# Patient Record
Sex: Female | Born: 1955 | Race: White | Hispanic: No | Smoking: Former smoker
Health system: Southern US, Community
[De-identification: ages and names within clinical notes are randomized; demographics above are authoritative.]

## PROBLEM LIST (undated history)

## (undated) DIAGNOSIS — E785 Hyperlipidemia, unspecified: Secondary | ICD-10-CM

## (undated) DIAGNOSIS — M1990 Unspecified osteoarthritis, unspecified site: Secondary | ICD-10-CM

## (undated) DIAGNOSIS — E039 Hypothyroidism, unspecified: Secondary | ICD-10-CM

## (undated) DIAGNOSIS — F419 Anxiety disorder, unspecified: Secondary | ICD-10-CM

## (undated) DIAGNOSIS — IMO0002 Reserved for concepts with insufficient information to code with codable children: Secondary | ICD-10-CM

## (undated) DIAGNOSIS — I2699 Other pulmonary embolism without acute cor pulmonale: Secondary | ICD-10-CM

## (undated) DIAGNOSIS — J449 Chronic obstructive pulmonary disease, unspecified: Secondary | ICD-10-CM

## (undated) DIAGNOSIS — IMO0001 Reserved for inherently not codable concepts without codable children: Secondary | ICD-10-CM

## (undated) DIAGNOSIS — Z66 Do not resuscitate: Principal | ICD-10-CM

## (undated) DIAGNOSIS — C3490 Malignant neoplasm of unspecified part of unspecified bronchus or lung: Secondary | ICD-10-CM

## (undated) HISTORY — DX: Reserved for inherently not codable concepts without codable children: IMO0001

## (undated) HISTORY — DX: Unspecified osteoarthritis, unspecified site: M19.90

## (undated) HISTORY — PX: THYROIDECTOMY, PARTIAL: SHX18

## (undated) HISTORY — DX: Hyperlipidemia, unspecified: E78.5

## (undated) HISTORY — PX: TONSILLECTOMY: SHX5217

## (undated) HISTORY — PX: TUBAL LIGATION: SHX77

## (undated) HISTORY — DX: Do not resuscitate: Z66

## (undated) HISTORY — PX: PARTIAL HYSTERECTOMY: SHX80

## (undated) HISTORY — DX: Anxiety disorder, unspecified: F41.9

## (undated) HISTORY — DX: Chronic obstructive pulmonary disease, unspecified: J44.9

## (undated) HISTORY — DX: Hypothyroidism, unspecified: E03.9

## (undated) HISTORY — PX: TONSILLECTOMY: SUR1361

## (undated) HISTORY — DX: Reserved for concepts with insufficient information to code with codable children: IMO0002

---

## 2005-05-26 ENCOUNTER — Ambulatory Visit: Payer: Self-pay | Admitting: Nurse Practitioner

## 2005-06-08 ENCOUNTER — Ambulatory Visit: Payer: Self-pay | Admitting: Nurse Practitioner

## 2005-07-13 ENCOUNTER — Ambulatory Visit: Payer: Self-pay | Admitting: Nurse Practitioner

## 2005-07-20 ENCOUNTER — Ambulatory Visit: Payer: Self-pay | Admitting: *Deleted

## 2005-07-28 ENCOUNTER — Ambulatory Visit: Payer: Self-pay | Admitting: Nurse Practitioner

## 2005-07-29 ENCOUNTER — Ambulatory Visit (HOSPITAL_COMMUNITY): Admission: RE | Admit: 2005-07-29 | Discharge: 2005-07-29 | Payer: Self-pay | Admitting: Nurse Practitioner

## 2005-08-11 ENCOUNTER — Ambulatory Visit: Payer: Self-pay | Admitting: Nurse Practitioner

## 2005-12-15 ENCOUNTER — Ambulatory Visit: Payer: Self-pay | Admitting: Nurse Practitioner

## 2006-03-04 ENCOUNTER — Ambulatory Visit: Payer: Self-pay | Admitting: Nurse Practitioner

## 2006-03-23 ENCOUNTER — Ambulatory Visit: Payer: Self-pay | Admitting: Nurse Practitioner

## 2006-04-05 ENCOUNTER — Ambulatory Visit: Payer: Self-pay | Admitting: Nurse Practitioner

## 2006-04-06 ENCOUNTER — Ambulatory Visit: Payer: Self-pay | Admitting: Nurse Practitioner

## 2006-04-27 ENCOUNTER — Ambulatory Visit: Payer: Self-pay | Admitting: Family Medicine

## 2006-05-03 ENCOUNTER — Ambulatory Visit: Payer: Self-pay | Admitting: Nurse Practitioner

## 2006-06-28 ENCOUNTER — Ambulatory Visit: Payer: Self-pay | Admitting: Nurse Practitioner

## 2006-08-25 ENCOUNTER — Ambulatory Visit (HOSPITAL_BASED_OUTPATIENT_CLINIC_OR_DEPARTMENT_OTHER): Admission: RE | Admit: 2006-08-25 | Discharge: 2006-08-25 | Payer: Self-pay | Admitting: Family Medicine

## 2006-09-19 ENCOUNTER — Ambulatory Visit: Payer: Self-pay | Admitting: Internal Medicine

## 2006-10-31 ENCOUNTER — Ambulatory Visit: Payer: Self-pay | Admitting: Internal Medicine

## 2006-11-30 ENCOUNTER — Ambulatory Visit: Payer: Self-pay | Admitting: Nurse Practitioner

## 2006-12-02 ENCOUNTER — Ambulatory Visit: Payer: Self-pay | Admitting: Nurse Practitioner

## 2007-05-17 ENCOUNTER — Encounter (INDEPENDENT_AMBULATORY_CARE_PROVIDER_SITE_OTHER): Payer: Self-pay | Admitting: Nurse Practitioner

## 2007-05-17 ENCOUNTER — Ambulatory Visit: Payer: Self-pay | Admitting: Internal Medicine

## 2007-05-17 LAB — CONVERTED CEMR LAB
Albumin: 4.5 g/dL (ref 3.5–5.2)
Basophils Absolute: 0.1 10*3/uL (ref 0.0–0.1)
Calcium: 8.7 mg/dL (ref 8.4–10.5)
Creatinine, Ser: 0.68 mg/dL (ref 0.40–1.20)
HCT: 44.8 % (ref 36.0–46.0)
Hemoglobin: 14.9 g/dL (ref 12.0–15.0)
MCHC: 33.3 g/dL (ref 30.0–36.0)
MCV: 89.6 fL (ref 78.0–100.0)
Monocytes Relative: 6 % (ref 3–11)
Platelets: 222 10*3/uL (ref 150–400)
RDW: 13.3 % (ref 11.5–14.0)
TSH: 3.684 microintl units/mL (ref 0.350–5.50)

## 2007-05-18 ENCOUNTER — Encounter (INDEPENDENT_AMBULATORY_CARE_PROVIDER_SITE_OTHER): Payer: Self-pay | Admitting: Nurse Practitioner

## 2007-05-18 LAB — CONVERTED CEMR LAB: Hgb A1c MFr Bld: 9.2 % — ABNORMAL HIGH (ref 4.6–6.1)

## 2007-06-15 ENCOUNTER — Encounter (INDEPENDENT_AMBULATORY_CARE_PROVIDER_SITE_OTHER): Payer: Self-pay | Admitting: Nurse Practitioner

## 2007-06-15 DIAGNOSIS — E119 Type 2 diabetes mellitus without complications: Secondary | ICD-10-CM

## 2007-06-15 DIAGNOSIS — E039 Hypothyroidism, unspecified: Secondary | ICD-10-CM

## 2007-06-15 DIAGNOSIS — E785 Hyperlipidemia, unspecified: Secondary | ICD-10-CM | POA: Insufficient documentation

## 2007-06-15 DIAGNOSIS — M810 Age-related osteoporosis without current pathological fracture: Secondary | ICD-10-CM

## 2007-06-15 DIAGNOSIS — F411 Generalized anxiety disorder: Secondary | ICD-10-CM | POA: Insufficient documentation

## 2007-06-26 ENCOUNTER — Ambulatory Visit: Payer: Self-pay | Admitting: Nurse Practitioner

## 2007-06-26 LAB — CONVERTED CEMR LAB
Cholesterol: 289 mg/dL — ABNORMAL HIGH (ref 0–200)
TSH: 5.082 microintl units/mL (ref 0.350–5.50)
Total CHOL/HDL Ratio: 7.6
Triglycerides: 371 mg/dL — ABNORMAL HIGH (ref <150)
VLDL: 74 mg/dL — ABNORMAL HIGH (ref 0–40)

## 2007-06-28 ENCOUNTER — Ambulatory Visit: Payer: Self-pay | Admitting: Internal Medicine

## 2007-07-05 ENCOUNTER — Encounter (INDEPENDENT_AMBULATORY_CARE_PROVIDER_SITE_OTHER): Payer: Self-pay | Admitting: *Deleted

## 2007-09-28 ENCOUNTER — Ambulatory Visit: Payer: Self-pay | Admitting: Family Medicine

## 2007-11-01 ENCOUNTER — Ambulatory Visit: Payer: Self-pay | Admitting: Internal Medicine

## 2007-11-01 ENCOUNTER — Encounter (INDEPENDENT_AMBULATORY_CARE_PROVIDER_SITE_OTHER): Payer: Self-pay | Admitting: Nurse Practitioner

## 2007-11-01 LAB — CONVERTED CEMR LAB
Cholesterol: 288 mg/dL — ABNORMAL HIGH (ref 0–200)
Total CHOL/HDL Ratio: 8.5
Triglycerides: 334 mg/dL — ABNORMAL HIGH (ref <150)
VLDL: 67 mg/dL — ABNORMAL HIGH (ref 0–40)

## 2007-11-16 ENCOUNTER — Ambulatory Visit: Payer: Self-pay | Admitting: Internal Medicine

## 2007-11-16 ENCOUNTER — Encounter (INDEPENDENT_AMBULATORY_CARE_PROVIDER_SITE_OTHER): Payer: Self-pay | Admitting: Nurse Practitioner

## 2007-11-16 LAB — CONVERTED CEMR LAB
BUN: 12 mg/dL (ref 6–23)
CO2: 23 meq/L (ref 19–32)
Calcium: 9.2 mg/dL (ref 8.4–10.5)
Chloride: 104 meq/L (ref 96–112)
Sodium: 140 meq/L (ref 135–145)
TSH: 2.857 microintl units/mL (ref 0.350–5.50)
Total Bilirubin: 0.4 mg/dL (ref 0.3–1.2)
Total Protein: 7.2 g/dL (ref 6.0–8.3)

## 2008-06-17 ENCOUNTER — Ambulatory Visit: Payer: Self-pay | Admitting: Family Medicine

## 2008-06-17 LAB — CONVERTED CEMR LAB
AST: 17 units/L (ref 0–37)
Albumin: 4.3 g/dL (ref 3.5–5.2)
Alkaline Phosphatase: 54 units/L (ref 39–117)
CO2: 23 meq/L (ref 19–32)
Creatinine, Ser: 0.72 mg/dL (ref 0.40–1.20)
Glucose, Bld: 102 mg/dL — ABNORMAL HIGH (ref 70–99)
LDL Cholesterol: 139 mg/dL — ABNORMAL HIGH (ref 0–99)
Microalb, Ur: 0.22 mg/dL (ref 0.00–1.89)
Potassium: 4.3 meq/L (ref 3.5–5.3)
Sodium: 141 meq/L (ref 135–145)
Total Bilirubin: 0.4 mg/dL (ref 0.3–1.2)
Triglycerides: 306 mg/dL — ABNORMAL HIGH (ref <150)
VLDL: 61 mg/dL — ABNORMAL HIGH (ref 0–40)

## 2008-11-04 ENCOUNTER — Ambulatory Visit: Payer: Self-pay | Admitting: Internal Medicine

## 2008-11-28 ENCOUNTER — Encounter: Payer: Self-pay | Admitting: Internal Medicine

## 2008-11-28 ENCOUNTER — Ambulatory Visit: Payer: Self-pay | Admitting: Internal Medicine

## 2008-11-28 ENCOUNTER — Ambulatory Visit: Payer: Self-pay | Admitting: Vascular Surgery

## 2008-11-28 ENCOUNTER — Encounter (INDEPENDENT_AMBULATORY_CARE_PROVIDER_SITE_OTHER): Payer: Self-pay | Admitting: Internal Medicine

## 2008-11-28 ENCOUNTER — Ambulatory Visit (HOSPITAL_COMMUNITY): Admission: RE | Admit: 2008-11-28 | Discharge: 2008-11-28 | Payer: Self-pay | Admitting: Internal Medicine

## 2008-11-28 LAB — CONVERTED CEMR LAB
ALT: 28 units/L (ref 0–35)
AST: 21 units/L (ref 0–37)
Alkaline Phosphatase: 55 units/L (ref 39–117)
BUN: 9 mg/dL (ref 6–23)
CO2: 22 meq/L (ref 19–32)
Calcium: 8.8 mg/dL (ref 8.4–10.5)
Cholesterol: 291 mg/dL — ABNORMAL HIGH (ref 0–200)
Creatinine, Ser: 0.66 mg/dL (ref 0.40–1.20)
Lymphocytes Relative: 40 % (ref 12–46)
Lymphs Abs: 3.5 10*3/uL (ref 0.7–4.0)
Magnesium: 1.9 mg/dL (ref 1.5–2.5)
Monocytes Absolute: 0.6 10*3/uL (ref 0.1–1.0)
Platelets: 243 10*3/uL (ref 150–400)
Potassium: 4.3 meq/L (ref 3.5–5.3)
RBC: 4.76 M/uL (ref 3.87–5.11)
Total Bilirubin: 0.4 mg/dL (ref 0.3–1.2)
Total Protein: 7.1 g/dL (ref 6.0–8.3)
Triglycerides: 396 mg/dL — ABNORMAL HIGH (ref <150)
WBC: 8.8 10*3/uL (ref 4.0–10.5)

## 2008-12-23 ENCOUNTER — Ambulatory Visit: Payer: Self-pay | Admitting: Internal Medicine

## 2008-12-24 ENCOUNTER — Ambulatory Visit (HOSPITAL_COMMUNITY): Admission: RE | Admit: 2008-12-24 | Discharge: 2008-12-24 | Payer: Self-pay | Admitting: Internal Medicine

## 2009-02-18 ENCOUNTER — Ambulatory Visit: Payer: Self-pay | Admitting: Internal Medicine

## 2009-03-06 ENCOUNTER — Ambulatory Visit: Payer: Self-pay | Admitting: Internal Medicine

## 2009-03-06 ENCOUNTER — Encounter: Payer: Self-pay | Admitting: Internal Medicine

## 2009-03-25 ENCOUNTER — Ambulatory Visit: Payer: Self-pay | Admitting: Internal Medicine

## 2009-03-25 ENCOUNTER — Ambulatory Visit (HOSPITAL_COMMUNITY): Admission: RE | Admit: 2009-03-25 | Discharge: 2009-03-25 | Payer: Self-pay | Admitting: Internal Medicine

## 2009-03-28 ENCOUNTER — Ambulatory Visit (HOSPITAL_COMMUNITY): Admission: RE | Admit: 2009-03-28 | Discharge: 2009-03-28 | Payer: Self-pay | Admitting: Internal Medicine

## 2009-04-25 ENCOUNTER — Ambulatory Visit: Payer: Self-pay | Admitting: Internal Medicine

## 2009-04-30 ENCOUNTER — Ambulatory Visit: Payer: Self-pay | Admitting: Internal Medicine

## 2009-05-09 ENCOUNTER — Ambulatory Visit: Payer: Self-pay | Admitting: Internal Medicine

## 2009-05-09 DIAGNOSIS — J441 Chronic obstructive pulmonary disease with (acute) exacerbation: Secondary | ICD-10-CM

## 2009-05-26 ENCOUNTER — Ambulatory Visit: Payer: Self-pay | Admitting: Family Medicine

## 2009-06-09 ENCOUNTER — Ambulatory Visit: Payer: Self-pay | Admitting: Internal Medicine

## 2009-06-12 ENCOUNTER — Encounter: Payer: Self-pay | Admitting: Internal Medicine

## 2009-06-12 ENCOUNTER — Ambulatory Visit: Admission: RE | Admit: 2009-06-12 | Discharge: 2009-06-12 | Payer: Self-pay | Admitting: Internal Medicine

## 2009-06-12 ENCOUNTER — Ambulatory Visit: Payer: Self-pay | Admitting: Internal Medicine

## 2009-06-14 DIAGNOSIS — C3411 Malignant neoplasm of upper lobe, right bronchus or lung: Secondary | ICD-10-CM

## 2009-06-18 ENCOUNTER — Ambulatory Visit: Payer: Self-pay | Admitting: Internal Medicine

## 2009-06-19 ENCOUNTER — Encounter: Payer: Self-pay | Admitting: Emergency Medicine

## 2009-06-19 ENCOUNTER — Ambulatory Visit: Payer: Self-pay | Admitting: Emergency Medicine

## 2009-06-19 ENCOUNTER — Encounter: Payer: Self-pay | Admitting: Internal Medicine

## 2009-06-19 ENCOUNTER — Ambulatory Visit: Payer: Self-pay | Admitting: Internal Medicine

## 2009-06-20 ENCOUNTER — Ambulatory Visit (HOSPITAL_COMMUNITY): Admission: RE | Admit: 2009-06-20 | Discharge: 2009-06-20 | Payer: Self-pay | Admitting: Internal Medicine

## 2009-06-24 ENCOUNTER — Ambulatory Visit: Admission: RE | Admit: 2009-06-24 | Discharge: 2009-08-22 | Payer: Self-pay | Admitting: Radiation Oncology

## 2009-06-24 LAB — COMPREHENSIVE METABOLIC PANEL WITH GFR
ALT: 14 U/L (ref 0–35)
AST: 13 U/L (ref 0–37)
Albumin: 4.3 g/dL (ref 3.5–5.2)
Alkaline Phosphatase: 57 U/L (ref 39–117)
BUN: 13 mg/dL (ref 6–23)
CO2: 22 meq/L (ref 19–32)
Calcium: 9.2 mg/dL (ref 8.4–10.5)
Chloride: 106 meq/L (ref 96–112)
Creatinine, Ser: 0.68 mg/dL (ref 0.40–1.20)
Glucose, Bld: 145 mg/dL — ABNORMAL HIGH (ref 70–99)
Potassium: 4.3 meq/L (ref 3.5–5.3)
Sodium: 141 meq/L (ref 135–145)
Total Bilirubin: 0.2 mg/dL — ABNORMAL LOW (ref 0.3–1.2)
Total Protein: 7.2 g/dL (ref 6.0–8.3)

## 2009-06-24 LAB — CBC WITH DIFFERENTIAL/PLATELET
BASO%: 0.8 % (ref 0.0–2.0)
Basophils Absolute: 0.1 10e3/uL (ref 0.0–0.1)
EOS%: 2.9 % (ref 0.0–7.0)
Eosinophils Absolute: 0.3 10e3/uL (ref 0.0–0.5)
HCT: 38.1 % (ref 34.8–46.6)
HGB: 13 g/dL (ref 11.6–15.9)
LYMPH%: 20.2 % (ref 14.0–49.7)
MCH: 29.7 pg (ref 25.1–34.0)
MCHC: 34.2 g/dL (ref 31.5–36.0)
MCV: 86.9 fL (ref 79.5–101.0)
MONO#: 0.6 10e3/uL (ref 0.1–0.9)
MONO%: 5.5 % (ref 0.0–14.0)
NEUT#: 7.9 10e3/uL — ABNORMAL HIGH (ref 1.5–6.5)
NEUT%: 70.6 % (ref 38.4–76.8)
Platelets: 344 10e3/uL (ref 145–400)
RBC: 4.39 10e6/uL (ref 3.70–5.45)
RDW: 13.5 % (ref 11.2–14.5)
WBC: 11.3 10e3/uL — ABNORMAL HIGH (ref 3.9–10.3)
lymph#: 2.3 10e3/uL (ref 0.9–3.3)

## 2009-06-26 ENCOUNTER — Ambulatory Visit (HOSPITAL_COMMUNITY): Admission: RE | Admit: 2009-06-26 | Discharge: 2009-06-26 | Payer: Self-pay | Admitting: Emergency Medicine

## 2009-06-30 LAB — CBC WITH DIFFERENTIAL/PLATELET
BASO%: 1.1 % (ref 0.0–2.0)
EOS%: 2.9 % (ref 0.0–7.0)
Eosinophils Absolute: 0.3 10*3/uL (ref 0.0–0.5)
LYMPH%: 27.6 % (ref 14.0–49.7)
MCH: 28.9 pg (ref 25.1–34.0)
MCHC: 33.8 g/dL (ref 31.5–36.0)
MCV: 85.4 fL (ref 79.5–101.0)
MONO%: 6.3 % (ref 0.0–14.0)
NEUT#: 5.9 10*3/uL (ref 1.5–6.5)
Platelets: 247 10*3/uL (ref 145–400)
RBC: 4.81 10*6/uL (ref 3.70–5.45)
RDW: 14 % (ref 11.2–14.5)

## 2009-06-30 LAB — COMPREHENSIVE METABOLIC PANEL
ALT: 17 U/L (ref 0–35)
AST: 14 U/L (ref 0–37)
Albumin: 4.5 g/dL (ref 3.5–5.2)
Alkaline Phosphatase: 59 U/L (ref 39–117)
Potassium: 4.1 mEq/L (ref 3.5–5.3)
Sodium: 141 mEq/L (ref 135–145)
Total Bilirubin: 0.3 mg/dL (ref 0.3–1.2)
Total Protein: 7.2 g/dL (ref 6.0–8.3)

## 2009-07-07 LAB — CBC WITH DIFFERENTIAL/PLATELET
EOS%: 4.4 % (ref 0.0–7.0)
Eosinophils Absolute: 0.4 10*3/uL (ref 0.0–0.5)
LYMPH%: 17.6 % (ref 14.0–49.7)
MCH: 29.3 pg (ref 25.1–34.0)
MCV: 85.5 fL (ref 79.5–101.0)
MONO%: 7.7 % (ref 0.0–14.0)
NEUT#: 5.8 10*3/uL (ref 1.5–6.5)
Platelets: 213 10*3/uL (ref 145–400)
RBC: 4.34 10*6/uL (ref 3.70–5.45)
RDW: 13.7 % (ref 11.2–14.5)

## 2009-07-07 LAB — COMPREHENSIVE METABOLIC PANEL
AST: 16 U/L (ref 0–37)
Albumin: 4.2 g/dL (ref 3.5–5.2)
Alkaline Phosphatase: 57 U/L (ref 39–117)
BUN: 7 mg/dL (ref 6–23)
Glucose, Bld: 131 mg/dL — ABNORMAL HIGH (ref 70–99)
Potassium: 4 mEq/L (ref 3.5–5.3)
Sodium: 141 mEq/L (ref 135–145)
Total Bilirubin: 0.3 mg/dL (ref 0.3–1.2)

## 2009-07-14 LAB — CBC WITH DIFFERENTIAL/PLATELET
BASO%: 1.4 % (ref 0.0–2.0)
Basophils Absolute: 0.1 10*3/uL (ref 0.0–0.1)
EOS%: 3 % (ref 0.0–7.0)
HCT: 36.8 % (ref 34.8–46.6)
HGB: 12.5 g/dL (ref 11.6–15.9)
LYMPH%: 16.5 % (ref 14.0–49.7)
MCH: 29.1 pg (ref 25.1–34.0)
MCHC: 34 g/dL (ref 31.5–36.0)
MCV: 85.8 fL (ref 79.5–101.0)
MONO%: 7.4 % (ref 0.0–14.0)
NEUT%: 71.7 % (ref 38.4–76.8)

## 2009-07-14 LAB — COMPREHENSIVE METABOLIC PANEL
ALT: 12 U/L (ref 0–35)
AST: 11 U/L (ref 0–37)
Alkaline Phosphatase: 59 U/L (ref 39–117)
BUN: 12 mg/dL (ref 6–23)
Calcium: 8.7 mg/dL (ref 8.4–10.5)
Creatinine, Ser: 0.69 mg/dL (ref 0.40–1.20)
Total Bilirubin: 0.3 mg/dL (ref 0.3–1.2)

## 2009-07-18 ENCOUNTER — Ambulatory Visit: Payer: Self-pay | Admitting: Internal Medicine

## 2009-07-21 LAB — CBC WITH DIFFERENTIAL/PLATELET
Basophils Absolute: 0.1 10*3/uL (ref 0.0–0.1)
Eosinophils Absolute: 0.1 10*3/uL (ref 0.0–0.5)
HCT: 36.6 % (ref 34.8–46.6)
HGB: 12.4 g/dL (ref 11.6–15.9)
MCH: 29.6 pg (ref 25.1–34.0)
MCV: 87.4 fL (ref 79.5–101.0)
MONO%: 6.8 % (ref 0.0–14.0)
NEUT#: 3.3 10*3/uL (ref 1.5–6.5)
NEUT%: 71.8 % (ref 38.4–76.8)
Platelets: 146 10*3/uL (ref 145–400)
RDW: 14.7 % — ABNORMAL HIGH (ref 11.2–14.5)

## 2009-07-21 LAB — COMPREHENSIVE METABOLIC PANEL
AST: 12 U/L (ref 0–37)
Alkaline Phosphatase: 61 U/L (ref 39–117)
BUN: 9 mg/dL (ref 6–23)
Creatinine, Ser: 0.61 mg/dL (ref 0.40–1.20)
Glucose, Bld: 111 mg/dL — ABNORMAL HIGH (ref 70–99)
Total Bilirubin: 0.3 mg/dL (ref 0.3–1.2)

## 2009-07-22 LAB — CBC WITH DIFFERENTIAL/PLATELET
Basophils Absolute: 0 10*3/uL (ref 0.0–0.1)
EOS%: 0.1 % (ref 0.0–7.0)
Eosinophils Absolute: 0 10*3/uL (ref 0.0–0.5)
LYMPH%: 3.8 % — ABNORMAL LOW (ref 14.0–49.7)
MCH: 30 pg (ref 25.1–34.0)
MCV: 86.5 fL (ref 79.5–101.0)
MONO%: 2.3 % (ref 0.0–14.0)
NEUT#: 6.4 10*3/uL (ref 1.5–6.5)
Platelets: 196 10*3/uL (ref 145–400)
RBC: 4.12 10*6/uL (ref 3.70–5.45)

## 2009-07-22 LAB — COMPREHENSIVE METABOLIC PANEL
Alkaline Phosphatase: 60 U/L (ref 39–117)
BUN: 14 mg/dL (ref 6–23)
Glucose, Bld: 115 mg/dL — ABNORMAL HIGH (ref 70–99)
Sodium: 140 mEq/L (ref 135–145)
Total Bilirubin: 0.3 mg/dL (ref 0.3–1.2)

## 2009-07-25 ENCOUNTER — Telehealth: Payer: Self-pay | Admitting: Internal Medicine

## 2009-07-28 LAB — COMPREHENSIVE METABOLIC PANEL
Albumin: 3.9 g/dL (ref 3.5–5.2)
BUN: 11 mg/dL (ref 6–23)
Calcium: 8.2 mg/dL — ABNORMAL LOW (ref 8.4–10.5)
Chloride: 107 mEq/L (ref 96–112)
Glucose, Bld: 143 mg/dL — ABNORMAL HIGH (ref 70–99)
Potassium: 4 mEq/L (ref 3.5–5.3)

## 2009-07-28 LAB — CBC WITH DIFFERENTIAL/PLATELET
Basophils Absolute: 0.1 10*3/uL (ref 0.0–0.1)
Eosinophils Absolute: 0.1 10*3/uL (ref 0.0–0.5)
HGB: 12.2 g/dL (ref 11.6–15.9)
MCV: 87.6 fL (ref 79.5–101.0)
MONO#: 0.3 10*3/uL (ref 0.1–0.9)
MONO%: 6.5 % (ref 0.0–14.0)
NEUT#: 3.9 10*3/uL (ref 1.5–6.5)
RBC: 4.1 10*6/uL (ref 3.70–5.45)
RDW: 15.1 % — ABNORMAL HIGH (ref 11.2–14.5)
WBC: 5.3 10*3/uL (ref 3.9–10.3)
lymph#: 0.8 10*3/uL — ABNORMAL LOW (ref 0.9–3.3)

## 2009-08-04 LAB — CBC WITH DIFFERENTIAL/PLATELET
Basophils Absolute: 0 10*3/uL (ref 0.0–0.1)
Eosinophils Absolute: 0.1 10*3/uL (ref 0.0–0.5)
HGB: 11.3 g/dL — ABNORMAL LOW (ref 11.6–15.9)
LYMPH%: 25.4 % (ref 14.0–49.7)
MCV: 87.5 fL (ref 79.5–101.0)
MONO#: 0.3 10*3/uL (ref 0.1–0.9)
NEUT#: 2 10*3/uL (ref 1.5–6.5)
Platelets: 100 10*3/uL — ABNORMAL LOW (ref 145–400)
RBC: 3.77 10*6/uL (ref 3.70–5.45)
WBC: 3.2 10*3/uL — ABNORMAL LOW (ref 3.9–10.3)
nRBC: 0 % (ref 0–0)

## 2009-08-04 LAB — COMPREHENSIVE METABOLIC PANEL
ALT: 16 U/L (ref 0–35)
CO2: 24 mEq/L (ref 19–32)
Calcium: 8.4 mg/dL (ref 8.4–10.5)
Chloride: 104 mEq/L (ref 96–112)
Glucose, Bld: 87 mg/dL (ref 70–99)
Sodium: 139 mEq/L (ref 135–145)
Total Protein: 6.6 g/dL (ref 6.0–8.3)

## 2009-08-12 LAB — CBC WITH DIFFERENTIAL/PLATELET
BASO%: 0.4 % (ref 0.0–2.0)
Eosinophils Absolute: 0 10*3/uL (ref 0.0–0.5)
MCHC: 34.9 g/dL (ref 31.5–36.0)
MONO#: 0.3 10*3/uL (ref 0.1–0.9)
NEUT#: 1.4 10*3/uL — ABNORMAL LOW (ref 1.5–6.5)
RBC: 3.44 10*6/uL — ABNORMAL LOW (ref 3.70–5.45)
RDW: 17.6 % — ABNORMAL HIGH (ref 11.2–14.5)
WBC: 2.4 10*3/uL — ABNORMAL LOW (ref 3.9–10.3)
lymph#: 0.7 10*3/uL — ABNORMAL LOW (ref 0.9–3.3)

## 2009-08-12 LAB — COMPREHENSIVE METABOLIC PANEL
ALT: 17 U/L (ref 0–35)
Albumin: 4.2 g/dL (ref 3.5–5.2)
CO2: 20 mEq/L (ref 19–32)
Calcium: 8.2 mg/dL — ABNORMAL LOW (ref 8.4–10.5)
Chloride: 105 mEq/L (ref 96–112)
Potassium: 3.8 mEq/L (ref 3.5–5.3)
Sodium: 136 mEq/L (ref 135–145)
Total Bilirubin: 0.2 mg/dL — ABNORMAL LOW (ref 0.3–1.2)
Total Protein: 6.8 g/dL (ref 6.0–8.3)

## 2009-08-19 ENCOUNTER — Encounter (INDEPENDENT_AMBULATORY_CARE_PROVIDER_SITE_OTHER): Payer: Self-pay | Admitting: Internal Medicine

## 2009-08-19 ENCOUNTER — Ambulatory Visit: Payer: Self-pay | Admitting: Internal Medicine

## 2009-08-19 LAB — CONVERTED CEMR LAB: TSH: 2.373 microintl units/mL (ref 0.350–4.500)

## 2009-09-04 ENCOUNTER — Ambulatory Visit: Payer: Self-pay | Admitting: Internal Medicine

## 2009-09-04 ENCOUNTER — Ambulatory Visit (HOSPITAL_COMMUNITY): Admission: RE | Admit: 2009-09-04 | Discharge: 2009-09-04 | Payer: Self-pay | Admitting: Internal Medicine

## 2009-09-08 LAB — CBC WITH DIFFERENTIAL/PLATELET
BASO%: 0.6 % (ref 0.0–2.0)
Eosinophils Absolute: 0.2 10*3/uL (ref 0.0–0.5)
HCT: 34.2 % — ABNORMAL LOW (ref 34.8–46.6)
LYMPH%: 19.8 % (ref 14.0–49.7)
MCHC: 35 g/dL (ref 31.5–36.0)
MCV: 94.1 fL (ref 79.5–101.0)
MONO%: 9.8 % (ref 0.0–14.0)
NEUT%: 66 % (ref 38.4–76.8)
Platelets: 235 10*3/uL (ref 145–400)
RBC: 3.64 10*6/uL — ABNORMAL LOW (ref 3.70–5.45)

## 2009-09-08 LAB — COMPREHENSIVE METABOLIC PANEL
Alkaline Phosphatase: 59 U/L (ref 39–117)
Creatinine, Ser: 0.76 mg/dL (ref 0.40–1.20)
Glucose, Bld: 119 mg/dL — ABNORMAL HIGH (ref 70–99)
Sodium: 139 mEq/L (ref 135–145)
Total Bilirubin: 0.2 mg/dL — ABNORMAL LOW (ref 0.3–1.2)
Total Protein: 6.9 g/dL (ref 6.0–8.3)

## 2009-09-16 LAB — COMPREHENSIVE METABOLIC PANEL
Albumin: 4.4 g/dL (ref 3.5–5.2)
Alkaline Phosphatase: 50 U/L (ref 39–117)
BUN: 11 mg/dL (ref 6–23)
CO2: 21 mEq/L (ref 19–32)
Calcium: 8.9 mg/dL (ref 8.4–10.5)
Glucose, Bld: 161 mg/dL — ABNORMAL HIGH (ref 70–99)
Potassium: 3.8 mEq/L (ref 3.5–5.3)
Sodium: 140 mEq/L (ref 135–145)
Total Protein: 6.8 g/dL (ref 6.0–8.3)

## 2009-09-16 LAB — CBC WITH DIFFERENTIAL/PLATELET
BASO%: 1.1 % (ref 0.0–2.0)
Basophils Absolute: 0.1 10*3/uL (ref 0.0–0.1)
EOS%: 5.3 % (ref 0.0–7.0)
HCT: 36 % (ref 34.8–46.6)
HGB: 12.4 g/dL (ref 11.6–15.9)
MCH: 31.9 pg (ref 25.1–34.0)
MCHC: 34.4 g/dL (ref 31.5–36.0)
MCV: 92.5 fL (ref 79.5–101.0)
MONO%: 9.8 % (ref 0.0–14.0)
NEUT%: 65.2 % (ref 38.4–76.8)
lymph#: 0.9 10*3/uL (ref 0.9–3.3)

## 2009-09-22 LAB — COMPREHENSIVE METABOLIC PANEL
ALT: 30 U/L (ref 0–35)
Albumin: 4.1 g/dL (ref 3.5–5.2)
Alkaline Phosphatase: 113 U/L (ref 39–117)
CO2: 24 mEq/L (ref 19–32)
Glucose, Bld: 159 mg/dL — ABNORMAL HIGH (ref 70–99)
Potassium: 4.1 mEq/L (ref 3.5–5.3)
Sodium: 137 mEq/L (ref 135–145)
Total Bilirubin: 0.5 mg/dL (ref 0.3–1.2)
Total Protein: 7.1 g/dL (ref 6.0–8.3)

## 2009-09-22 LAB — CBC WITH DIFFERENTIAL/PLATELET
BASO%: 0 % (ref 0.0–2.0)
Eosinophils Absolute: 0.3 10*3/uL (ref 0.0–0.5)
MCHC: 35.5 g/dL (ref 31.5–36.0)
MONO#: 0.8 10*3/uL (ref 0.1–0.9)
MONO%: 4.2 % (ref 0.0–14.0)
NEUT#: 17.2 10*3/uL — ABNORMAL HIGH (ref 1.5–6.5)
RBC: 3.72 10*6/uL (ref 3.70–5.45)
RDW: 17.3 % — ABNORMAL HIGH (ref 11.2–14.5)
WBC: 19.8 10*3/uL — ABNORMAL HIGH (ref 3.9–10.3)

## 2009-09-29 LAB — COMPREHENSIVE METABOLIC PANEL
ALT: 18 U/L (ref 0–35)
AST: 10 U/L (ref 0–37)
Albumin: 4.3 g/dL (ref 3.5–5.2)
Alkaline Phosphatase: 96 U/L (ref 39–117)
Potassium: 4.2 mEq/L (ref 3.5–5.3)
Sodium: 137 mEq/L (ref 135–145)
Total Bilirubin: 0.3 mg/dL (ref 0.3–1.2)
Total Protein: 7.4 g/dL (ref 6.0–8.3)

## 2009-09-29 LAB — CBC WITH DIFFERENTIAL/PLATELET
BASO%: 0.2 % (ref 0.0–2.0)
EOS%: 1.6 % (ref 0.0–7.0)
Eosinophils Absolute: 0.2 10*3/uL (ref 0.0–0.5)
LYMPH%: 12.6 % — ABNORMAL LOW (ref 14.0–49.7)
MCHC: 34.5 g/dL (ref 31.5–36.0)
MCV: 96 fL (ref 79.5–101.0)
MONO%: 5.6 % (ref 0.0–14.0)
NEUT#: 7.6 10*3/uL — ABNORMAL HIGH (ref 1.5–6.5)
Platelets: 198 10*3/uL (ref 145–400)
RBC: 3.63 10*6/uL — ABNORMAL LOW (ref 3.70–5.45)
RDW: 17.7 % — ABNORMAL HIGH (ref 11.2–14.5)

## 2009-10-02 ENCOUNTER — Ambulatory Visit: Payer: Self-pay | Admitting: Internal Medicine

## 2009-10-06 LAB — CBC WITH DIFFERENTIAL/PLATELET
EOS%: 3.2 % (ref 0.0–7.0)
Eosinophils Absolute: 0.2 10*3/uL (ref 0.0–0.5)
LYMPH%: 18.6 % (ref 14.0–49.7)
MCH: 32.3 pg (ref 25.1–34.0)
MCV: 93.8 fL (ref 79.5–101.0)
MONO%: 7.4 % (ref 0.0–14.0)
NEUT#: 4.1 10*3/uL (ref 1.5–6.5)
Platelets: 127 10*3/uL — ABNORMAL LOW (ref 145–400)
RBC: 3.71 10*6/uL (ref 3.70–5.45)
RDW: 15.7 % — ABNORMAL HIGH (ref 11.2–14.5)
nRBC: 0 % (ref 0–0)

## 2009-10-06 LAB — COMPREHENSIVE METABOLIC PANEL
ALT: 15 U/L (ref 0–35)
Alkaline Phosphatase: 64 U/L (ref 39–117)
CO2: 25 mEq/L (ref 19–32)
Creatinine, Ser: 0.78 mg/dL (ref 0.40–1.20)
Glucose, Bld: 161 mg/dL — ABNORMAL HIGH (ref 70–99)
Total Bilirubin: 0.3 mg/dL (ref 0.3–1.2)

## 2009-10-13 LAB — COMPREHENSIVE METABOLIC PANEL
ALT: 27 U/L (ref 0–35)
Alkaline Phosphatase: 112 U/L (ref 39–117)
Sodium: 139 mEq/L (ref 135–145)
Total Bilirubin: 0.4 mg/dL (ref 0.3–1.2)
Total Protein: 7 g/dL (ref 6.0–8.3)

## 2009-10-13 LAB — CBC WITH DIFFERENTIAL/PLATELET
BASO%: 0.3 % (ref 0.0–2.0)
EOS%: 1 % (ref 0.0–7.0)
LYMPH%: 10.2 % — ABNORMAL LOW (ref 14.0–49.7)
MCH: 32.6 pg (ref 25.1–34.0)
MCHC: 34.3 g/dL (ref 31.5–36.0)
MCV: 95.2 fL (ref 79.5–101.0)
MONO%: 5.1 % (ref 0.0–14.0)
Platelets: 96 10*3/uL — ABNORMAL LOW (ref 145–400)
RBC: 3.34 10*6/uL — ABNORMAL LOW (ref 3.70–5.45)
WBC: 17.7 10*3/uL — ABNORMAL HIGH (ref 3.9–10.3)

## 2009-10-20 LAB — CBC WITH DIFFERENTIAL/PLATELET
BASO%: 0.3 % (ref 0.0–2.0)
Basophils Absolute: 0 10e3/uL (ref 0.0–0.1)
EOS%: 1.3 % (ref 0.0–7.0)
Eosinophils Absolute: 0.1 10e3/uL (ref 0.0–0.5)
HCT: 31.8 % — ABNORMAL LOW (ref 34.8–46.6)
HGB: 11 g/dL — ABNORMAL LOW (ref 11.6–15.9)
LYMPH%: 14.5 % (ref 14.0–49.7)
MCH: 34.2 pg — ABNORMAL HIGH (ref 25.1–34.0)
MCHC: 34.6 g/dL (ref 31.5–36.0)
MCV: 98.8 fL (ref 79.5–101.0)
MONO#: 0.3 10e3/uL (ref 0.1–0.9)
MONO%: 4.8 % (ref 0.0–14.0)
NEUT#: 4.9 10e3/uL (ref 1.5–6.5)
NEUT%: 79.1 % — ABNORMAL HIGH (ref 38.4–76.8)
Platelets: 155 10e3/uL (ref 145–400)
RBC: 3.22 10e6/uL — ABNORMAL LOW (ref 3.70–5.45)
RDW: 15.8 % — ABNORMAL HIGH (ref 11.2–14.5)
WBC: 6.3 10e3/uL (ref 3.9–10.3)
lymph#: 0.9 10e3/uL (ref 0.9–3.3)

## 2009-10-20 LAB — COMPREHENSIVE METABOLIC PANEL WITH GFR
ALT: 16 U/L (ref 0–35)
AST: 12 U/L (ref 0–37)
Albumin: 4.3 g/dL (ref 3.5–5.2)
Alkaline Phosphatase: 84 U/L (ref 39–117)
BUN: 16 mg/dL (ref 6–23)
CO2: 26 meq/L (ref 19–32)
Calcium: 8.1 mg/dL — ABNORMAL LOW (ref 8.4–10.5)
Chloride: 103 meq/L (ref 96–112)
Creatinine, Ser: 0.83 mg/dL (ref 0.40–1.20)
Glucose, Bld: 155 mg/dL — ABNORMAL HIGH (ref 70–99)
Potassium: 4 meq/L (ref 3.5–5.3)
Sodium: 138 meq/L (ref 135–145)
Total Bilirubin: 0.2 mg/dL — ABNORMAL LOW (ref 0.3–1.2)
Total Protein: 6.5 g/dL (ref 6.0–8.3)

## 2009-10-27 LAB — COMPREHENSIVE METABOLIC PANEL
AST: 16 U/L (ref 0–37)
Albumin: 4.5 g/dL (ref 3.5–5.2)
CO2: 21 mEq/L (ref 19–32)
Glucose, Bld: 152 mg/dL — ABNORMAL HIGH (ref 70–99)
Potassium: 4.1 mEq/L (ref 3.5–5.3)
Total Protein: 7 g/dL (ref 6.0–8.3)

## 2009-10-27 LAB — CBC WITH DIFFERENTIAL/PLATELET
BASO%: 0.7 % (ref 0.0–2.0)
Basophils Absolute: 0 10*3/uL (ref 0.0–0.1)
LYMPH%: 17.9 % (ref 14.0–49.7)
MCHC: 34 g/dL (ref 31.5–36.0)
NEUT#: 3.1 10*3/uL (ref 1.5–6.5)
NEUT%: 68.5 % (ref 38.4–76.8)
RBC: 3.54 10*6/uL — ABNORMAL LOW (ref 3.70–5.45)
RDW: 15.1 % — ABNORMAL HIGH (ref 11.2–14.5)
WBC: 4.6 10*3/uL (ref 3.9–10.3)
lymph#: 0.8 10*3/uL — ABNORMAL LOW (ref 0.9–3.3)
nRBC: 0 % (ref 0–0)

## 2009-10-30 ENCOUNTER — Ambulatory Visit: Payer: Self-pay | Admitting: Internal Medicine

## 2009-11-03 LAB — CBC WITH DIFFERENTIAL/PLATELET
BASO%: 0.5 % (ref 0.0–2.0)
EOS%: 1.1 % (ref 0.0–7.0)
Eosinophils Absolute: 0.2 10*3/uL (ref 0.0–0.5)
HCT: 32.9 % — ABNORMAL LOW (ref 34.8–46.6)
MCHC: 35.3 g/dL (ref 31.5–36.0)
RBC: 3.29 10*6/uL — ABNORMAL LOW (ref 3.70–5.45)
RDW: 14.8 % — ABNORMAL HIGH (ref 11.2–14.5)
WBC: 14.5 10*3/uL — ABNORMAL HIGH (ref 3.9–10.3)
lymph#: 1 10*3/uL (ref 0.9–3.3)

## 2009-11-03 LAB — COMPREHENSIVE METABOLIC PANEL
ALT: 25 U/L (ref 0–35)
Alkaline Phosphatase: 109 U/L (ref 39–117)
BUN: 10 mg/dL (ref 6–23)
CO2: 23 mEq/L (ref 19–32)
Glucose, Bld: 212 mg/dL — ABNORMAL HIGH (ref 70–99)
Potassium: 3.8 mEq/L (ref 3.5–5.3)
Sodium: 139 mEq/L (ref 135–145)
Total Bilirubin: 0.2 mg/dL — ABNORMAL LOW (ref 0.3–1.2)

## 2009-11-14 ENCOUNTER — Ambulatory Visit (HOSPITAL_COMMUNITY): Admission: RE | Admit: 2009-11-14 | Discharge: 2009-11-14 | Payer: Self-pay | Admitting: Internal Medicine

## 2009-11-17 LAB — CBC WITH DIFFERENTIAL/PLATELET
EOS%: 2.4 % (ref 0.0–7.0)
LYMPH%: 13.3 % — ABNORMAL LOW (ref 14.0–49.7)
MCH: 35.6 pg — ABNORMAL HIGH (ref 25.1–34.0)
MONO%: 7.4 % (ref 0.0–14.0)
NEUT%: 76.7 % (ref 38.4–76.8)
WBC: 6.7 10*3/uL (ref 3.9–10.3)

## 2009-11-17 LAB — COMPREHENSIVE METABOLIC PANEL
ALT: 28 U/L (ref 0–35)
AST: 30 U/L (ref 0–37)
Alkaline Phosphatase: 74 U/L (ref 39–117)
Calcium: 8.4 mg/dL (ref 8.4–10.5)
Glucose, Bld: 215 mg/dL — ABNORMAL HIGH (ref 70–99)
Potassium: 3.8 mEq/L (ref 3.5–5.3)
Sodium: 137 mEq/L (ref 135–145)

## 2009-11-19 ENCOUNTER — Encounter (INDEPENDENT_AMBULATORY_CARE_PROVIDER_SITE_OTHER): Payer: Self-pay | Admitting: Adult Health

## 2009-11-19 ENCOUNTER — Ambulatory Visit: Payer: Self-pay | Admitting: Internal Medicine

## 2009-11-19 LAB — CONVERTED CEMR LAB
AST: 18 units/L (ref 0–37)
Albumin: 4.3 g/dL (ref 3.5–5.2)
Alkaline Phosphatase: 80 units/L (ref 39–117)
BUN: 11 mg/dL (ref 6–23)
Calcium: 8.6 mg/dL (ref 8.4–10.5)
Creatinine, Ser: 0.57 mg/dL (ref 0.40–1.20)
Eosinophils Relative: 3 % (ref 0–5)
HCT: 37.5 % (ref 36.0–46.0)
Hemoglobin: 12.1 g/dL (ref 12.0–15.0)
LDL Cholesterol: 164 mg/dL — ABNORMAL HIGH (ref 0–99)
MCV: 101.6 fL — ABNORMAL HIGH (ref 78.0–100.0)
Microalb, Ur: 0.5 mg/dL (ref 0.00–1.89)
Monocytes Absolute: 0.6 10*3/uL (ref 0.1–1.0)
Potassium: 3.8 meq/L (ref 3.5–5.3)
RBC: 3.69 M/uL — ABNORMAL LOW (ref 3.87–5.11)
Sodium: 139 meq/L (ref 135–145)
TSH: 5.854 microintl units/mL — ABNORMAL HIGH (ref 0.350–4.500)
Total Bilirubin: 0.3 mg/dL (ref 0.3–1.2)
Total CHOL/HDL Ratio: 6.6
VLDL: 55 mg/dL — ABNORMAL HIGH (ref 0–40)

## 2009-11-24 ENCOUNTER — Encounter (INDEPENDENT_AMBULATORY_CARE_PROVIDER_SITE_OTHER): Payer: Self-pay | Admitting: Adult Health

## 2009-11-24 LAB — CONVERTED CEMR LAB: T4, Total: 9.4 ug/dL (ref 5.0–12.5)

## 2009-12-02 ENCOUNTER — Emergency Department (HOSPITAL_COMMUNITY): Admission: EM | Admit: 2009-12-02 | Discharge: 2009-12-02 | Payer: Self-pay | Admitting: Family Medicine

## 2010-02-05 ENCOUNTER — Ambulatory Visit: Payer: Self-pay | Admitting: Internal Medicine

## 2010-02-06 LAB — CBC WITH DIFFERENTIAL/PLATELET
EOS%: 3 % (ref 0.0–7.0)
HCT: 38.4 % (ref 34.8–46.6)
HGB: 13.3 g/dL (ref 11.6–15.9)
LYMPH%: 26.2 % (ref 14.0–49.7)
MCH: 32.7 pg (ref 25.1–34.0)
MCHC: 34.7 g/dL (ref 31.5–36.0)
MCV: 94.2 fL (ref 79.5–101.0)
MONO#: 0.4 10*3/uL (ref 0.1–0.9)
MONO%: 9.1 % (ref 0.0–14.0)
NEUT%: 61 % (ref 38.4–76.8)
Platelets: 228 10*3/uL (ref 145–400)
RBC: 4.07 10*6/uL (ref 3.70–5.45)
lymph#: 1.2 10*3/uL (ref 0.9–3.3)

## 2010-02-06 LAB — COMPREHENSIVE METABOLIC PANEL
AST: 26 U/L (ref 0–37)
Albumin: 4.3 g/dL (ref 3.5–5.2)
BUN: 9 mg/dL (ref 6–23)
CO2: 24 mEq/L (ref 19–32)
Creatinine, Ser: 0.69 mg/dL (ref 0.40–1.20)
Glucose, Bld: 170 mg/dL — ABNORMAL HIGH (ref 70–99)
Sodium: 139 mEq/L (ref 135–145)
Total Bilirubin: 0.4 mg/dL (ref 0.3–1.2)
Total Protein: 7.5 g/dL (ref 6.0–8.3)

## 2010-02-09 ENCOUNTER — Ambulatory Visit (HOSPITAL_COMMUNITY): Admission: RE | Admit: 2010-02-09 | Discharge: 2010-02-09 | Payer: Self-pay | Admitting: Internal Medicine

## 2010-02-13 ENCOUNTER — Ambulatory Visit: Payer: Self-pay | Admitting: Internal Medicine

## 2010-02-19 ENCOUNTER — Ambulatory Visit (HOSPITAL_COMMUNITY): Admission: RE | Admit: 2010-02-19 | Discharge: 2010-02-19 | Payer: Self-pay | Admitting: Family Medicine

## 2010-05-07 ENCOUNTER — Ambulatory Visit: Payer: Self-pay | Admitting: Internal Medicine

## 2010-05-11 ENCOUNTER — Ambulatory Visit (HOSPITAL_COMMUNITY): Admission: RE | Admit: 2010-05-11 | Discharge: 2010-05-11 | Payer: Self-pay | Admitting: Internal Medicine

## 2010-05-11 LAB — CBC WITH DIFFERENTIAL/PLATELET
Basophils Absolute: 0 10*3/uL (ref 0.0–0.1)
Eosinophils Absolute: 0.1 10*3/uL (ref 0.0–0.5)
HCT: 35.9 % (ref 34.8–46.6)
LYMPH%: 22.8 % (ref 14.0–49.7)
MCH: 31.6 pg (ref 25.1–34.0)
MCV: 89.3 fL (ref 79.5–101.0)
NEUT#: 3.6 10*3/uL (ref 1.5–6.5)
NEUT%: 66.1 % (ref 38.4–76.8)
lymph#: 1.3 10*3/uL (ref 0.9–3.3)

## 2010-05-11 LAB — COMPREHENSIVE METABOLIC PANEL
ALT: 30 U/L (ref 0–35)
CO2: 25 mEq/L (ref 19–32)
Chloride: 106 mEq/L (ref 96–112)
Total Bilirubin: 0.4 mg/dL (ref 0.3–1.2)

## 2010-05-15 ENCOUNTER — Ambulatory Visit: Payer: Self-pay | Admitting: Internal Medicine

## 2010-05-15 ENCOUNTER — Encounter (INDEPENDENT_AMBULATORY_CARE_PROVIDER_SITE_OTHER): Payer: Self-pay | Admitting: Adult Health

## 2010-05-15 LAB — CONVERTED CEMR LAB: TSH: 5.83 microintl units/mL — ABNORMAL HIGH (ref 0.350–4.500)

## 2010-08-07 ENCOUNTER — Ambulatory Visit: Payer: Self-pay | Admitting: Internal Medicine

## 2010-08-07 ENCOUNTER — Ambulatory Visit (HOSPITAL_COMMUNITY): Admission: RE | Admit: 2010-08-07 | Discharge: 2010-08-07 | Payer: Self-pay | Admitting: Internal Medicine

## 2010-08-07 LAB — CBC WITH DIFFERENTIAL/PLATELET
Basophils Absolute: 0 10*3/uL (ref 0.0–0.1)
EOS%: 3.8 % (ref 0.0–7.0)
Eosinophils Absolute: 0.2 10*3/uL (ref 0.0–0.5)
HCT: 36.7 % (ref 34.8–46.6)
HGB: 12.8 g/dL (ref 11.6–15.9)
LYMPH%: 23.3 % (ref 14.0–49.7)
MCH: 31.4 pg (ref 25.1–34.0)
MCHC: 34.8 g/dL (ref 31.5–36.0)
MCV: 90 fL (ref 79.5–101.0)
MONO#: 0.5 10*3/uL (ref 0.1–0.9)
MONO%: 8.5 % (ref 0.0–14.0)
NEUT#: 3.5 10*3/uL (ref 1.5–6.5)
NEUT%: 63.6 % (ref 38.4–76.8)
RBC: 4.07 10*6/uL (ref 3.70–5.45)
lymph#: 1.3 10*3/uL (ref 0.9–3.3)

## 2010-08-07 LAB — COMPREHENSIVE METABOLIC PANEL
Alkaline Phosphatase: 45 U/L (ref 39–117)
Glucose, Bld: 123 mg/dL — ABNORMAL HIGH (ref 70–99)
Potassium: 3.9 mEq/L (ref 3.5–5.3)
Total Protein: 7.1 g/dL (ref 6.0–8.3)

## 2010-10-30 ENCOUNTER — Encounter (INDEPENDENT_AMBULATORY_CARE_PROVIDER_SITE_OTHER): Payer: Self-pay | Admitting: *Deleted

## 2010-10-30 LAB — CONVERTED CEMR LAB
ALT: 25 units/L (ref 0–35)
Albumin: 4.5 g/dL (ref 3.5–5.2)
Alkaline Phosphatase: 50 units/L (ref 39–117)
CO2: 24 meq/L (ref 19–32)
Calcium: 8.9 mg/dL (ref 8.4–10.5)
Cholesterol: 231 mg/dL — ABNORMAL HIGH (ref 0–200)
Glucose, Bld: 124 mg/dL — ABNORMAL HIGH (ref 70–99)
LDL Cholesterol: 153 mg/dL — ABNORMAL HIGH (ref 0–99)
Potassium: 4.4 meq/L (ref 3.5–5.3)
Sodium: 141 meq/L (ref 135–145)
Triglycerides: 215 mg/dL — ABNORMAL HIGH (ref <150)
VLDL: 43 mg/dL — ABNORMAL HIGH (ref 0–40)

## 2010-11-05 ENCOUNTER — Other Ambulatory Visit: Payer: Self-pay | Admitting: Internal Medicine

## 2010-11-05 DIAGNOSIS — Z85118 Personal history of other malignant neoplasm of bronchus and lung: Secondary | ICD-10-CM

## 2010-11-19 NOTE — Assessment & Plan Note (Signed)
Summary: Pulmonary/ ext f/u ov with hfa teaching only 50% effective   Copy to:  Dr. Sherene Sires Primary Provider/Referring Provider:  Dr. Champ Mungo  CC:  Followup.  Pt states she is SOB "all the time"- unless resting.  She states that she was out of breath walking from parking lot to our building today.  She also c/o occ prod cough with clear sputum.   She c/o no energy.  Marland Kitchen  History of Present Illness: 58 yowf smoker, evaluated in 7/10 by Dr Sherene Sires for gradual onset worsening  tendency to sinus congestion and drainage  assoc chronic cough worse in winter never goes completely away x sev years mucoid thick worst in am usually. CXR with persistent RUL opacity worrisome for post-obstructive process.  Prompted FOB on 06/12/09 that showed probable bronchogenic carcinoma involving the lateral tracheal wall and extending into the right upper lobe and partially obstructing the right upper lobe posterior segment. 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  Pt states she is SOB "all the time"- unless resting.  She states that she was out of breath walking from parking lot to our building today.  She also c/o occ prod cough with clear sputum.   She c/o no energy.   Feels like on a roller coaster in terms of doe and much better sometimes to point where can chase grandchild inside and out, other times sob at rest.   Freq throat clear with no excess mucus doesn't disturb sleep.  Freq throat clearing. Pt denies any significant sore throat, dysphagia, itching, sneezing,  nasal congestion or excess secretions,  fever, chills, sweats, unintended wt loss, pleuritic or exertional cp, hempoptysis, change in activity tolerance  orthopnea pnd or leg swelling. Pt also denies any obvious fluctuation in symptoms with weather or environmental change or other alleviating or aggravating factors.       Current Medications (verified): 1)  Glucophage 500 Mg Tabs (Metformin Hcl) .... Take One (1) By Mouth Twice A Day 2)   Aspirin 325 Mg Tabs (Aspirin) .Marland Kitchen.. 1 Once Daily 3)  Zoloft 100 Mg Tabs (Sertraline Hcl) .Marland Kitchen.. 1 1/2 Once Daily 4)  Lorazepam 1 Mg  Tabs (Lorazepam) .... One By Mouth Two Times A Day As Needed Anxiety 5)  Levothroid 112 Mcg Tabs (Levothyroxine Sodium) .Marland Kitchen.. 1 Once Daily 6)  Arthrotec 75 75-200 Mg-Mcg Tabs (Diclofenac-Misoprostol) .Marland Kitchen.. 1 Two Times A Day 7)  Singulair 10 Mg Tabs (Montelukast Sodium) .Marland Kitchen.. 1 Once Daily 8)  Tricor 145 Mg Tabs (Fenofibrate) .Marland Kitchen.. 1 Once Daily 9)  Aldactone 25 Mg Tabs (Spironolactone) .Marland Kitchen.. 1 Once Daily 10)  Centrum Silver  Tabs (Multiple Vitamins-Minerals) .Marland Kitchen.. 1 Once Daily 11)  Vitamin B-12 500 Mcg Tabs (Cyanocobalamin) .Marland Kitchen.. 1 Once Daily 12)  Chromium 1000 Mcg Tabs (Chromium) .Marland Kitchen.. 1 Once Daily 13)  Magnesium 500 Mg Tabs (Magnesium) .Marland Kitchen.. 1 Once Daily 14)  Ventolin Hfa 108 (90 Base) Mcg/act Aers (Albuterol Sulfate) .... 2 Puffs Every 4 Hours As Needed 15)  Mucinex Maximum Strength 1200 Mg Xr12h-Tab (Guaifenesin) .Marland Kitchen.. 1 To 2 Every 12 Hours As Needed 16)  Symbicort 160-4.5 Mcg/act Aero (Budesonide-Formoterol Fumarate) .... 2 Puffs Every 12 Hours 17)  Niaspan 1000 Mg Cr-Tabs (Niacin (Antihyperlipidemic)) .Marland Kitchen.. 1 Once Daily  Allergies (verified): 1)  ! Pcn 2)  ! Sulfa 3)  ! Codeine  Past History:  Past Medical History: Anxiety Hyperlipidemia Hypothyroidism Diabetes mellitus, type II COPD    - HFA 50% May 09, 2009  RUL Nodule/localized wheeze    - First noted symptoms 02/2009    - FOB 06/12/09 > friable mucosa along distal lateral rul > POS SQUAMOUS CELL CA NSC Lung CA distal right lat tracheal wall     -Reffered to [facility]  June 16, 2009 > no further records avail > requested February 09, 2010   Past Surgical History: Reviewed history from 04/25/2009 and no changes required. Hysterectomy 1984 Thyroidectomy(partial) 1974 Tonsillectomy Tubal ligation 1979  Social History: Divorced Conservator, museum/gallery Former smoker.  Quit Oct 2010.  Vital  Signs:  Patient profile:   55 year old female Menstrual status:  hysterectomy Weight:      195.25 pounds BMI:     29.79 O2 Sat:      94 % on Room air Temp:     97.6 degrees F oral Pulse rate:   123 / minute BP sitting:   126 / 68  (left arm)  Vitals Entered By: Vernie Murders (February 05, 2010 9:20 AM)  O2 Flow:  Room air  Physical Exam  Additional Exam:  pleasant amb wf  wt 170-195 February 05, 2010  HEENT: full dentures, nl  turbinates, and orophanx.  NECK :  without JVD/Nodes/TM/ nl carotid upstrokes bilaterally LUNGS: no acc muscle use, variable mostly  right upper airway  lateralizing exp rhonchi  without cough on insp or exp maneuvers CV:  RRR  no s3 or murmur or increase in P2, no edema   ABD:  soft and nontender with nl excursion in the supine position. No bruits or organomegaly, bowel sounds nl MS:  warm without deformities, calf tenderness, cyanosis or clubbing      Impression & Recommendations:  Problem # 1:  COPD UNSPECIFIED (ICD-496)  DDX of  difficult airways managment all start with A and  include Adherence, Ace Inhibitors, Acid Reflux, Active Sinus Disease, Alpha 1 Antitripsin deficiency, Anxiety masquerading as Airways dz,  ABPA,  allergy(esp in young), Aspiration (esp in elderly), Adverse effects of DPI,  Active smokers, plus one B  = Beta blocker use..    Adherence:  I spent extra time with the patient today explaining optimal mdi  technique.  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 1 once daily 2)  Symbicort 160-4.5 Mcg/act Aero (Budesonide-formoterol fumarate) .... 2 puffs first thing  in am and 2 puffs again in pm  about 12 hours later 3)  Niaspan 1000 Mg Cr-tabs (Niacin (antihyperlipidemic)) .Marland Kitchen.. 1 once daily 4)  Prilosec 20 Mg Cpdr (Omeprazole) .... Take  one 30-60 min before first meal of the day  Other Orders: Est. Patient Level IV (96045)  Patient Instructions: 1)  Prilosec 20 mg Take  one 30-60 min before first meal of the day 2)  Work on inhaler technique:  relax and blow all the way out then take a nice smooth deep breath back in, triggering the inhaler at same time you start breathing in 3)  GERD (REFLUX)  is a common cause of respiratory symptoms. It commonly presents without heartburn and can be treated with medication, but also with lifestyle changes including avoidance  of late meals, excessive alcohol, smoking cessation, and avoid fatty foods, chocolate, peppermint, colas, red wine, and acidic juices such as orange juice. NO MINT OR MENTHOL PRODUCTS SO NO COUGH DROPS  4)  USE SUGARLESS CANDY INSTEAD (jolley ranchers)  5)  NO OIL BASED VITAMINS  6)  Please schedule a follow-up appointment in 6 weeks, sooner if needed for PFT's Prescriptions: PRILOSEC 20 MG CPDR (OMEPRAZOLE) Take  one 30-60 min before first meal of the day  #30 x 3   Entered and Authorized by:   Nyoka Cowden MD   Signed by:   Nyoka Cowden MD on 02/05/2010   Method used:   Faxed to ...       [facility] (retail)       [address] [place], [name]  [address]       Ph: [phone]       Fax: [phone]   RxID:   [id number] SYMBICORT 160-4.5 MCG/ACT AERO (BUDESONIDE-FORMOTEROL FUMARATE) 2 puffs first thing  in am and 2 puffs again in pm about 12 hours later  #1 x 11   Entered and Authorized by:   Nyoka Cowden MD   Signed by:   Nyoka Cowden MD on 02/05/2010   Method used:   Faxed to ...       [facility] (retail)       [address] [place], [name]  [address]       Ph: [phone]       Fax: [phone]   RxID:    [id number]

## 2010-12-07 ENCOUNTER — Other Ambulatory Visit: Payer: Self-pay | Admitting: Internal Medicine

## 2010-12-07 ENCOUNTER — Encounter (HOSPITAL_COMMUNITY): Payer: Self-pay

## 2010-12-07 ENCOUNTER — Encounter (HOSPITAL_BASED_OUTPATIENT_CLINIC_OR_DEPARTMENT_OTHER): Payer: Self-pay | Admitting: Internal Medicine

## 2010-12-07 ENCOUNTER — Ambulatory Visit (HOSPITAL_COMMUNITY)
Admission: RE | Admit: 2010-12-07 | Discharge: 2010-12-07 | Disposition: A | Discharge status: Home / Self Care | Payer: Self-pay | Source: Ambulatory Visit | Attending: Internal Medicine | Admitting: Internal Medicine

## 2010-12-07 DIAGNOSIS — I700 Atherosclerosis of aorta: Secondary | ICD-10-CM | POA: Insufficient documentation

## 2010-12-07 DIAGNOSIS — K7689 Other specified diseases of liver: Secondary | ICD-10-CM | POA: Insufficient documentation

## 2010-12-07 DIAGNOSIS — Z85118 Personal history of other malignant neoplasm of bronchus and lung: Secondary | ICD-10-CM

## 2010-12-07 DIAGNOSIS — J984 Other disorders of lung: Secondary | ICD-10-CM | POA: Insufficient documentation

## 2010-12-07 DIAGNOSIS — I2510 Atherosclerotic heart disease of native coronary artery without angina pectoris: Secondary | ICD-10-CM | POA: Insufficient documentation

## 2010-12-07 DIAGNOSIS — M4120 Other idiopathic scoliosis, site unspecified: Secondary | ICD-10-CM | POA: Insufficient documentation

## 2010-12-07 DIAGNOSIS — C3410 Malignant neoplasm of upper lobe, unspecified bronchus or lung: Secondary | ICD-10-CM

## 2010-12-07 DIAGNOSIS — C3490 Malignant neoplasm of unspecified part of unspecified bronchus or lung: Secondary | ICD-10-CM | POA: Insufficient documentation

## 2010-12-07 DIAGNOSIS — E278 Other specified disorders of adrenal gland: Secondary | ICD-10-CM | POA: Insufficient documentation

## 2010-12-07 HISTORY — DX: Malignant neoplasm of unspecified part of unspecified bronchus or lung: C34.90

## 2010-12-07 LAB — CBC WITH DIFFERENTIAL/PLATELET
BASO%: 1.2 % (ref 0.0–2.0)
Eosinophils Absolute: 0.1 10*3/uL (ref 0.0–0.5)
HCT: 38.6 % (ref 34.8–46.6)
HGB: 13.5 g/dL (ref 11.6–15.9)
MCHC: 35 g/dL (ref 31.5–36.0)
MONO#: 0.6 10*3/uL (ref 0.1–0.9)
NEUT#: 3.9 10*3/uL (ref 1.5–6.5)
NEUT%: 58.4 % (ref 38.4–76.8)
Platelets: 194 10*3/uL (ref 145–400)
RBC: 4.4 10*6/uL (ref 3.70–5.45)
WBC: 6.7 10*3/uL (ref 3.9–10.3)
lymph#: 2 10*3/uL (ref 0.9–3.3)

## 2010-12-07 LAB — CMP (CANCER CENTER ONLY)
ALT(SGPT): 31 U/L (ref 10–47)
AST: 27 U/L (ref 11–38)
Calcium: 8.5 mg/dL (ref 8.0–10.3)
Creat: 0.8 mg/dl (ref 0.6–1.2)
Glucose, Bld: 119 mg/dL — ABNORMAL HIGH (ref 73–118)
Potassium: 4.1 mEq/L (ref 3.3–4.7)
Total Bilirubin: 0.4 mg/dl (ref 0.20–1.60)
Total Protein: 7.2 g/dL (ref 6.4–8.1)

## 2010-12-07 MED ORDER — IOHEXOL 300 MG/ML  SOLN
80.0000 mL | Freq: Once | INTRAMUSCULAR | Status: AC | PRN
  Administered 2010-12-07: 80 mL via INTRAVENOUS

## 2010-12-09 ENCOUNTER — Other Ambulatory Visit: Payer: Self-pay | Admitting: Internal Medicine

## 2010-12-09 ENCOUNTER — Encounter (HOSPITAL_BASED_OUTPATIENT_CLINIC_OR_DEPARTMENT_OTHER): Payer: Self-pay | Admitting: Internal Medicine

## 2010-12-09 DIAGNOSIS — C3490 Malignant neoplasm of unspecified part of unspecified bronchus or lung: Secondary | ICD-10-CM

## 2010-12-09 DIAGNOSIS — C3410 Malignant neoplasm of upper lobe, unspecified bronchus or lung: Secondary | ICD-10-CM

## 2010-12-29 ENCOUNTER — Encounter (INDEPENDENT_AMBULATORY_CARE_PROVIDER_SITE_OTHER): Payer: Self-pay | Admitting: *Deleted

## 2011-01-22 LAB — GLUCOSE, CAPILLARY: Glucose-Capillary: 143 mg/dL — ABNORMAL HIGH (ref 70–99)

## 2011-01-23 LAB — AFB CULTURE WITH SMEAR (NOT AT ARMC): Acid Fast Smear: NONE SEEN

## 2011-03-02 ENCOUNTER — Encounter (HOSPITAL_COMMUNITY): Payer: Self-pay

## 2011-03-02 ENCOUNTER — Other Ambulatory Visit: Payer: Self-pay | Admitting: Internal Medicine

## 2011-03-02 ENCOUNTER — Encounter (HOSPITAL_BASED_OUTPATIENT_CLINIC_OR_DEPARTMENT_OTHER): Payer: Self-pay | Admitting: Internal Medicine

## 2011-03-02 ENCOUNTER — Ambulatory Visit (HOSPITAL_COMMUNITY)
Admission: RE | Admit: 2011-03-02 | Discharge: 2011-03-02 | Disposition: A | Discharge status: Home / Self Care | Payer: Self-pay | Source: Ambulatory Visit | Attending: Internal Medicine | Admitting: Internal Medicine

## 2011-03-02 DIAGNOSIS — C3490 Malignant neoplasm of unspecified part of unspecified bronchus or lung: Secondary | ICD-10-CM

## 2011-03-02 DIAGNOSIS — Z9089 Acquired absence of other organs: Secondary | ICD-10-CM | POA: Insufficient documentation

## 2011-03-02 DIAGNOSIS — J398 Other specified diseases of upper respiratory tract: Secondary | ICD-10-CM | POA: Insufficient documentation

## 2011-03-02 DIAGNOSIS — R059 Cough, unspecified: Secondary | ICD-10-CM | POA: Insufficient documentation

## 2011-03-02 DIAGNOSIS — R599 Enlarged lymph nodes, unspecified: Secondary | ICD-10-CM | POA: Insufficient documentation

## 2011-03-02 DIAGNOSIS — R05 Cough: Secondary | ICD-10-CM | POA: Insufficient documentation

## 2011-03-02 DIAGNOSIS — K7689 Other specified diseases of liver: Secondary | ICD-10-CM | POA: Insufficient documentation

## 2011-03-02 DIAGNOSIS — I2510 Atherosclerotic heart disease of native coronary artery without angina pectoris: Secondary | ICD-10-CM | POA: Insufficient documentation

## 2011-03-02 DIAGNOSIS — C3410 Malignant neoplasm of upper lobe, unspecified bronchus or lung: Secondary | ICD-10-CM

## 2011-03-02 DIAGNOSIS — J984 Other disorders of lung: Secondary | ICD-10-CM | POA: Insufficient documentation

## 2011-03-02 DIAGNOSIS — E278 Other specified disorders of adrenal gland: Secondary | ICD-10-CM | POA: Insufficient documentation

## 2011-03-02 DIAGNOSIS — J438 Other emphysema: Secondary | ICD-10-CM | POA: Insufficient documentation

## 2011-03-02 DIAGNOSIS — R110 Nausea: Secondary | ICD-10-CM | POA: Insufficient documentation

## 2011-03-02 LAB — CMP (CANCER CENTER ONLY)
AST: 25 U/L (ref 11–38)
Alkaline Phosphatase: 51 U/L (ref 26–84)
BUN, Bld: 15 mg/dL (ref 7–22)
Creat: 0.7 mg/dl (ref 0.6–1.2)
Total Bilirubin: 0.4 mg/dl (ref 0.20–1.60)

## 2011-03-02 LAB — CBC WITH DIFFERENTIAL/PLATELET
BASO%: 0.5 % (ref 0.0–2.0)
Basophils Absolute: 0 10*3/uL (ref 0.0–0.1)
EOS%: 3.2 % (ref 0.0–7.0)
HGB: 12.7 g/dL (ref 11.6–15.9)
MCH: 30.4 pg (ref 25.1–34.0)
MCHC: 34.4 g/dL (ref 31.5–36.0)
MCV: 88.4 fL (ref 79.5–101.0)
MONO#: 0.5 10*3/uL (ref 0.1–0.9)
MONO%: 8 % (ref 0.0–14.0)
RBC: 4.17 10*6/uL (ref 3.70–5.45)
RDW: 13.9 % (ref 11.2–14.5)
lymph#: 1.5 10*3/uL (ref 0.9–3.3)

## 2011-03-02 MED ORDER — IOHEXOL 300 MG/ML  SOLN
100.0000 mL | Freq: Once | INTRAMUSCULAR | Status: AC | PRN
  Administered 2011-03-02: 100 mL via INTRAVENOUS

## 2011-03-02 NOTE — Op Note (Signed)
[name], Janice Ball               ACCOUNT NO.:  [id number]   MEDICAL RECORD NO.:  [id number]          PATIENT TYPE:  AMB   LOCATION:  CARD                         FACILITY:  [facility]   PHYSICIAN:  Casimiro Needle B. Sherene Sires, MD, FCCPDATE OF BIRTH:  [birth year] 07, 1956   DATE OF PROCEDURE:  06/12/2009  DATE OF DISCHARGE:                               OPERATIVE REPORT   PROCEDURE:  Fiberoptic bronchoscopy with endobronchial biopsy of the  right upper lobe orifice.   HISTORY AND INDICATIONS:  Please see attached office notes.  There has  been no change in the history or exam since the previous evaluation was  done and the patient agreed to the procedure after full discussion of  risks, benefits and alternatives.   A formal time-out procedure was performed.   The procedure was performed in the bronchoscopy suite with continuous  monitoring by surface ECG and oximetry.  The patient maintained adequate  saturations throughout the procedure in sinus rhythm with minimal  desaturations that responded to supplemental oxygen by nasal prongs.  She was given a total of 50 mcg IV fentanyl and 5 mg of IV Versed for  adequate sedation and cough suppression.   Using a standard flexible fiberoptic scope, the right naris was easily  cannulated with good visualization of the oropharynx and larynx.  The  cords moved normally and there were no apparent upper airway lesions.   Using additional 1% lidocaine as needed, the entire tracheobronchial  tree was explored bilaterally with the following findings.  Trachea,  carina and the all major airways opened widely to the subsegmental level  except for the right upper lobe orifice.  Here there appeared to be  cobblestoning with partial obstruction of the posterior segment of the  right upper lobe with friable mucosa that extended to the lateral wall  of the trachea.   Initially, this area was lavaged with adequate return.   Additionally, the mucosa was biopsied x4 with  adequate return of minimal  bleeding.   IMPRESSION:  Probable bronchogenic carcinoma involving the lateral  tracheal wall and extending into the right upper lobe and partially  obstructing the right upper lobe posterior segment.  This would not  appear to be a resectable lesion by bronchoscopy and she will therefore  be referred, once we have tissue diagnosis, to the Oncology Clinic.   The patient tolerated the procedure well.  Her daughter was not present  at the end of the procedure but will be contacted by phone to discuss  the results and plans for follow-up.      Charlaine Dalton. Sherene Sires, MD, [facility]  Electronically Signed    MBW/MEDQ  D:  06/12/2009  T:  06/12/2009  Job:  [id number]

## 2011-03-05 NOTE — Procedures (Signed)
[name], COMES               ACCOUNT NO.:  [id number]   MEDICAL RECORD NO.:  [id number]          PATIENT TYPE:  OUT   LOCATION:  SLEEP CENTER                 FACILITY:  [facility]   PHYSICIAN:  Clinton D. Maple Hudson, MD, FCCP, FACPDATE OF BIRTH:  05/29/1954   DATE OF STUDY:  08/25/2006                              NOCTURNAL POLYSOMNOGRAM   INDICATION. FOR STUDY:  Hypersomnia with sleep Apnea.   EPWORTH SLEEPINESS SCORE:  13/24, BMI 29, weight 195 pounds.   MEDICATIONS:  Arthrotec, Aldactone, Chantix, Zoloft, Lorazepam, Levothroid.   SLEEP ARCHITECTURE:  Total sleep time 366 minutes with sleep efficiency 93%.  Stage 1 was 4%, stage 2 56%, stages 3 and 4 15%, REM 35% of total sleep  time, sleep latency 22 minutes, REM latency 140 minutes, awake after sleep  onset 11 minutes, arousal index 4.3.  No bedtime medication was taken.   RESPIRATORY DATA:  Apnea hypopnea index (AHI, RDI) 8.7 obstructive events  per hour indicating mild obstructive sleep apnea/hypopnea syndrome (normal  range 0 to 5 per hour).  There were 14 obstructive apneas and 39 hypopneas.  The events were most common while sleeping supine with a fairly clear  positional component related to sleep position.  REM AHI 26.5 per hour.  There were insufficient events to permit use of CPAP titration by split  protocol on this study night.   OXYGEN DATA:  Moderate snoring with oxygen desaturation to a nadir of 83%.  Mean oxygen saturation through the study was 92% on room air.   CARDIAC DATA:  Normal sinus rhythm.   MOVEMENT-PARASOMNIA:  No significant limb jerks or unusual movements or  bathroom trips.   IMPRESSIONS-RECOMMENDATIONS:  1. Mild obstructive sleep apnea/hypopnea syndrome, AHI 8.7 per hour      (normal range 0 to 5 per hour).  The events were strongly associated      with supine sleep position.  There was moderate snoring with oxygen      desaturation to a nadir of 83%.  2. It may be sufficient to encourage her to  sleep off the flat of her      back.  Scores in this range would be      considered marginal for consideration of CPAP therapy if simpler      measures such as change in sleep position, weight loss, and treatment      for nasal congestion are insufficient.      Clinton D. Maple Hudson, MD, FCCP, FACP  Diplomate, Biomedical engineer of Sleep Medicine  Electronically Signed     CDY/MEDQ  D:  08/27/2006 12:42:51  T:  08/27/2006 16:19:22  Job:  [id number]

## 2011-03-09 ENCOUNTER — Other Ambulatory Visit: Payer: Self-pay | Admitting: Internal Medicine

## 2011-03-09 ENCOUNTER — Encounter (HOSPITAL_BASED_OUTPATIENT_CLINIC_OR_DEPARTMENT_OTHER): Payer: Self-pay | Admitting: Internal Medicine

## 2011-03-09 DIAGNOSIS — C3410 Malignant neoplasm of upper lobe, unspecified bronchus or lung: Secondary | ICD-10-CM

## 2011-03-09 DIAGNOSIS — C3490 Malignant neoplasm of unspecified part of unspecified bronchus or lung: Secondary | ICD-10-CM

## 2011-04-01 ENCOUNTER — Ambulatory Visit (INDEPENDENT_AMBULATORY_CARE_PROVIDER_SITE_OTHER): Payer: Self-pay | Admitting: Internal Medicine

## 2011-04-01 ENCOUNTER — Encounter: Payer: Self-pay | Admitting: Internal Medicine

## 2011-04-01 VITALS — BP 120/64 | HR 91 | Temp 97.8°F | Ht 68.0 in | Wt 189.4 lb

## 2011-04-01 DIAGNOSIS — J441 Chronic obstructive pulmonary disease with (acute) exacerbation: Secondary | ICD-10-CM

## 2011-04-01 DIAGNOSIS — C3490 Malignant neoplasm of unspecified part of unspecified bronchus or lung: Secondary | ICD-10-CM

## 2011-04-01 DIAGNOSIS — J449 Chronic obstructive pulmonary disease, unspecified: Secondary | ICD-10-CM

## 2011-04-01 MED ORDER — BUDESONIDE-FORMOTEROL FUMARATE 160-4.5 MCG/ACT IN AERO: 2.0000 | INHALATION_SPRAY | Freq: Two times a day (BID) | RESPIRATORY_TRACT | Status: DC

## 2011-04-01 MED ORDER — DOXYCYCLINE HYCLATE 100 MG PO TABS: 100.0000 mg | ORAL_TABLET | Freq: Two times a day (BID) | ORAL | Status: AC

## 2011-04-01 MED ORDER — PREDNISONE 10 MG PO TABS: ORAL_TABLET | ORAL | Status: DC

## 2011-04-01 NOTE — Patient Instructions (Signed)
You are likely having a copd attack Continue symbicort and singulair  - nurse will do refill on symbicort Please take doxycycline 100mg  twice daily after meals x 5 days Take prednisone 40mg  po daily x 3 days, then 20mg  po daily x 3 days, 10mg  daily x 3 days, then 5mg  daily x 3 days and stop Please have PFT breathing test in 3 - 4 weeks and see Tammy Parrett for followup and spiriva consideration

## 2011-04-01 NOTE — Progress Notes (Addendum)
  Subjective:    Patient ID: Janice Ball, female    DOB: September 28, 1956, 55 y.o.   MRN: [id number]  HPI  55 year old female. Patient of Dr. Sherene Sires. Known RUL posterior segment NSCLC - oct 2010, details not known. Also, chart diagnosis of COPD, Former smoker - quit 2008  OV June 2012: Feels like a 'train wreck' and 'awful'. Complaints of worsening cough x 3 weeks associated with increased sputuum with change in color to yellow/green of small volume. Also has heaaches,  Nausea, body aches,  And feels like body is holding fluids. Denies fever, wheeze. No other symptoms.    Review of Systems  Constitutional: Negative for fever and unexpected weight change.  HENT: Negative for ear pain, nosebleeds, congestion, sore throat, rhinorrhea, sneezing, trouble swallowing, dental problem, postnasal drip and sinus pressure.   Eyes: Negative for redness and itching.  Respiratory: Positive for cough, chest tightness and shortness of breath. Negative for wheezing.   Cardiovascular: Positive for leg swelling. Negative for palpitations.  Gastrointestinal: Negative for nausea and vomiting.  Genitourinary: Negative for dysuria.  Musculoskeletal: Negative for joint swelling.  Skin: Negative for rash.  Neurological: Negative for headaches.  Hematological: Does not bruise/bleed easily.  Psychiatric/Behavioral: Negative for dysphoric mood. The patient is not nervous/anxious.        Objective:   Physical Exam    Physical Exam  GEN: A/Ox3; pleasant , NAD, well nourished  HEENT: [place]/AT, EACs-clear, TMs-wnl, NOSE-clear, THROAT-clear, no lesions, no postnasal drip or exudate noted.  NECK: Supple w/ fair ROM; no JVD; normal carotid impulses w/o bruits; no thyromegaly or nodules palpated; no lymphadenopathy.  RESP Coarse BS w/ few exp wheezes no accessory muscle use, no dullness to percussion  CARD: RRR, no m/r/g , no peripheral edema, pulses intact, no cyanosis or clubbing.  GI: Soft & nt; nml bowel sounds; no  organomegaly or masses detected.  Musco: Warm bil, no deformities or joint swelling noted.  Neuro: alert, no focal deficits noted.  Skin: Warm, no lesions or rashes     Assessment & Plan:

## 2011-04-04 ENCOUNTER — Encounter: Payer: Self-pay | Admitting: Internal Medicine

## 2011-04-04 NOTE — Assessment & Plan Note (Signed)
Unclear current status. Will forwad to Dr Sherene Sires

## 2011-04-04 NOTE — Assessment & Plan Note (Signed)
You are likely having a copd attack Continue symbicort and singulair  - nurse will do refill on symbicort Please take doxycycline 100mg twice daily after meals x 5 days Take prednisone 40mg po daily x 3 days, then 20mg po daily x 3 days, 10mg daily x 3 days, then 5mg daily x 3 days and stop Please have PFT breathing test in 3 - 4 weeks and see Tammy Parrett for followup and spiriva consideration  

## 2011-04-22 ENCOUNTER — Ambulatory Visit (INDEPENDENT_AMBULATORY_CARE_PROVIDER_SITE_OTHER): Payer: Self-pay | Admitting: Internal Medicine

## 2011-04-22 DIAGNOSIS — J449 Chronic obstructive pulmonary disease, unspecified: Secondary | ICD-10-CM

## 2011-04-22 LAB — PULMONARY FUNCTION TEST

## 2011-04-22 NOTE — Progress Notes (Signed)
PFT done today. 

## 2011-04-29 ENCOUNTER — Encounter: Payer: Self-pay | Admitting: Adult Health

## 2011-04-29 ENCOUNTER — Ambulatory Visit (INDEPENDENT_AMBULATORY_CARE_PROVIDER_SITE_OTHER): Payer: Self-pay | Admitting: Adult Health

## 2011-04-29 ENCOUNTER — Ambulatory Visit (INDEPENDENT_AMBULATORY_CARE_PROVIDER_SITE_OTHER)
Admission: RE | Admit: 2011-04-29 | Discharge: 2011-04-29 | Disposition: A | Discharge status: Home / Self Care | Payer: Self-pay | Source: Ambulatory Visit | Attending: Adult Health | Admitting: Adult Health

## 2011-04-29 ENCOUNTER — Telehealth: Payer: Self-pay | Admitting: *Deleted

## 2011-04-29 VITALS — BP 130/72 | HR 90 | Temp 98.1°F | Ht 68.0 in | Wt 189.4 lb

## 2011-04-29 DIAGNOSIS — J441 Chronic obstructive pulmonary disease with (acute) exacerbation: Secondary | ICD-10-CM

## 2011-04-29 MED ORDER — DM-GUAIFENESIN ER 30-600 MG PO TB12: 1.0000 | ORAL_TABLET | Freq: Two times a day (BID) | ORAL | Status: AC

## 2011-04-29 MED ORDER — MOXIFLOXACIN HCL 400 MG PO TABS: 400.0000 mg | ORAL_TABLET | Freq: Every day | ORAL | Status: AC

## 2011-04-29 MED ORDER — PREDNISONE 10 MG PO TABS: ORAL_TABLET | ORAL | Status: AC

## 2011-04-29 NOTE — Telephone Encounter (Signed)
Spoke with pt and sched her to see MR on 05/21/11 pending MR approval- pls advise if okay to keep her on the sched. Thanks

## 2011-04-29 NOTE — Telephone Encounter (Signed)
[email or web address]  

## 2011-04-29 NOTE — Telephone Encounter (Signed)
Pt seen by TP in office for follow up from acute visit with MR.  Pt is requesting to change from MW to MR.  Will forward to MW.  Pt will need 3 week follow up.

## 2011-04-29 NOTE — Telephone Encounter (Signed)
Fine with me

## 2011-04-29 NOTE — Progress Notes (Signed)
Subjective:    Patient ID: Janice Ball, female    DOB: October 12, 1956, 55 y.o.   MRN: [id number]  HPI 66 yowf smoker, evaluated in 7/10 by Dr Sherene Sires for gradual onset worsening tendency to sinus congestion and drainage assoc chronic cough worse in winter never goes completely away x sev years mucoid thick worst in am usually. CXR with persistent RUL opacity worrisome for post-obstructive process. Prompted FOB on 06/12/09 that showed probable bronchogenic carcinoma involving the lateral  tracheal wall and extending into the right upper lobe and partially obstructing the right upper lobe posterior segment. 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 No sign. Change with SABA . DLCO 65% (04/29/2011 )  February 05, 2010 Last smoked Oct 2010. Pt states she is SOB "all the time"- unless resting. She states that she was out of breath walking from parking lot to our building today. She also c/o occ prod cough with clear sputum. She c/o no energy. Feels like on a roller coaster in terms of doe and much better sometimes to point where can chase grandchild inside and out, other times sob at rest. Freq throat clear with no excess mucus doesn't disturb sleep. Freq throat clearing.   04/29/2011 Follow up  Pt presents for a follow up. Seen 2 weeks ago for an AECOPD , tx with Doxycycline x 5 days and steroid taper. . She says she got some improvement but never totally went away and cough is returning over last week. Complains of HA, hemoptysis with bright red blood x1 2 days ago , prod cough with green mucus, wheezing x1 week.  She had a PFT today that showed preserved lung fxn -mild COPD  FEV1 2.19 L/M (80%), ratio of 66. No sign. Change with SABA . DLCO 65% She is followed by Dr. Gwenyth Bouillon for her lung cancer. Last CT chest 02/2011 showed Similar to minimal enlargement of a right paratracheal lymph node.. No evidence of new or progressive disease within the chest. . Scattered pulmonary nodules which are stable  and nonspecific. Next follow up CT chest in 06/2011 .  She does admit she restarted smoking for short time but quit 1 month ago "for good"   Past Medical History:  Anxiety  Hyperlipidemia  Hypothyroidism  Diabetes mellitus, type II  COPD  - HFA 50% May 09, 2009  RUL Nodule/localized wheeze  - First noted symptoms 02/2009  - FOB 06/12/09 > friable mucosa along distal lateral rul > POS SQUAMOUS CELL CA  NSC Lung CA distal right lat tracheal wall  -Reffered to [facility] June 16, 2009 > no further records avail > requested February 09, 2010  S/p chemo and radiation       Review of Systems Constitutional:   No  weight loss, night sweats,  Fevers, chills, fatigue, or  lassitude.  HEENT:   No headaches,  Difficulty swallowing,  Tooth/dental problems, or  Sore throat,                No sneezing, itching, ear ache, nasal congestion, post nasal drip,   CV:  No chest pain,  Orthopnea, PND, swelling in lower extremities, anasarca, dizziness, palpitations, syncope.   GI  No heartburn, indigestion, abdominal pain, nausea, vomiting, diarrhea, change in bowel habits, loss of appetite, bloody stools.   Resp:     No change in color of mucus.   No chest wall deformity  Skin: no rash or lesions.  GU: no dysuria, change in color of  urine, no urgency or frequency.  No flank pain, no hematuria   MS:  No joint pain or swelling.  No decreased range of motion.    Psych:  No change in mood or affect. No depression or anxiety.          Objective:   Physical Exam GEN: A/Ox3; pleasant , NAD, well nourished   HEENT:  [place]/AT,  EACs-clear, TMs-wnl, NOSE-clear, THROAT-clear, no lesions, no postnasal drip or exudate noted.   NECK:  Supple w/ fair ROM; no JVD; normal carotid impulses w/o bruits; no thyromegaly or nodules palpated; no lymphadenopathy.  RESP  Coarse BS w/ few exp wheezes no accessory muscle use, no dullness to percussion  CARD:  RRR, no m/r/g  , no peripheral edema, pulses intact, no  cyanosis or clubbing.  GI:   Soft & nt; nml bowel sounds; no organomegaly or masses detected.  Musco: Warm bil, no deformities or joint swelling noted.   Neuro: alert, no focal deficits noted.    Skin: Warm, no lesions or rashes         Assessment & Plan:

## 2011-04-29 NOTE — Patient Instructions (Addendum)
Avelox 400mg  daily for  7 days  Mucinex DM Twice daily  As needed  Cough/congestion  Fluids and rest  Prednisone taper over next week.  NO SMOKING  I will call with xray results.  Please contact office for sooner follow up if symptoms do not improve or worsen or seek emergency care  Cell number [phone] follow up 3 weeks and As needed

## 2011-04-29 NOTE — Assessment & Plan Note (Signed)
Slow to resolve flare  Xray pending.   Plan :  Avelox 400mg  daily for  7 days  Mucinex DM Twice daily  As needed  Cough/congestion  Fluids and rest  Prednisone taper over next week.  NO SMOKING  I will call with xray results.  Please contact office for sooner follow up if symptoms do not improve or worsen or seek emergency care  Cell number [phone] follow up 3 weeks and As needed

## 2011-04-30 ENCOUNTER — Telehealth: Payer: Self-pay | Admitting: Adult Health

## 2011-04-30 NOTE — Telephone Encounter (Signed)
Pt aware samples at the front. Janice Ball, CMA

## 2011-05-04 NOTE — Telephone Encounter (Signed)
Fine with me for switch and date

## 2011-05-05 NOTE — Telephone Encounter (Signed)
[id number] to verify appt with the pt. Carron Curie, CMA

## 2011-05-05 NOTE — Telephone Encounter (Signed)
Pt returned call.  She is aware MR and MW are ok with her switching to MR.  She is aware of pending appt with MR on 05/21/11 at 9 am.  She verbalized understanding of this and voiced no further questions/concerns at this time.

## 2011-05-21 ENCOUNTER — Institutional Professional Consult (permissible substitution): Payer: Self-pay | Admitting: Internal Medicine

## 2011-06-02 ENCOUNTER — Ambulatory Visit (INDEPENDENT_AMBULATORY_CARE_PROVIDER_SITE_OTHER): Payer: Self-pay | Admitting: Internal Medicine

## 2011-06-02 ENCOUNTER — Encounter: Payer: Self-pay | Admitting: Internal Medicine

## 2011-06-02 VITALS — BP 120/80 | HR 95 | Temp 97.8°F | Ht 68.0 in | Wt 190.4 lb

## 2011-06-02 DIAGNOSIS — C3490 Malignant neoplasm of unspecified part of unspecified bronchus or lung: Secondary | ICD-10-CM

## 2011-06-02 DIAGNOSIS — J449 Chronic obstructive pulmonary disease, unspecified: Secondary | ICD-10-CM

## 2011-06-02 DIAGNOSIS — R609 Edema, unspecified: Secondary | ICD-10-CM

## 2011-06-02 DIAGNOSIS — R600 Localized edema: Secondary | ICD-10-CM

## 2011-06-02 NOTE — Assessment & Plan Note (Signed)
#  COPD - this is currently stable -continue your current medications #Followup - 6 months or sooner if there is copd attack - have flu shot today or when available

## 2011-06-02 NOTE — Assessment & Plan Note (Signed)
#  Leg swelling  - this is probably due to varicose veins; please talk to your PMD about this

## 2011-06-02 NOTE — Patient Instructions (Addendum)
#  Leg swelling  - this is probably due to varicose veins; please talk to your PMD about this #COPD - this is currently stable -continue your current medications #Followup - 6 months or sooner if there is copd attack - have flu shot today or when available - check alpha 1 at followup

## 2011-06-02 NOTE — Progress Notes (Signed)
Subjective:    Patient ID: Janice Ball, female    DOB: 11-02-1955, 55 y.o.   MRN: [id number]  HPI  6 yowf smoker, evaluated in 7/10 by Dr Sherene Sires for gradual onset worsening tendency to sinus congestion and drainage assoc chronic cough worse in winter never goes completely away x sev years mucoid thick worst in am usually. CXR with persistent RUL opacity worrisome for post-obstructive process. Prompted FOB on 06/12/09 that showed probable bronchogenic carcinoma involving the lateral  tracheal wall and extending into the right upper lobe and partially obstructing the right upper lobe posterior segment. 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 FEV1 2.19 L/M (80%), ratio of 66. No sign. Change with SABA . DLCO 65% (04/29/2011 )  February 05, 2010 Last smoked Oct 2010. Pt states she is SOB "all the time"- unless resting. She states that she was out of breath walking from parking lot to our building today. She also c/o occ prod cough with clear sputum. She c/o no energy. Feels like on a roller coaster in terms of doe and much better sometimes to point where can chase grandchild inside and out, other times sob at rest. Freq throat clear with no excess mucus doesn't disturb sleep. Freq throat clearing.   April 01, 2011: AECOPD - Rx abx and pred (had hemoptysis)  04/29/2011 Follow up: Pt presents for a follow up. Seen 2 weeks ago for an AECOPD , tx with Doxycycline x 5 days and steroid taper. . She says she got some improvement but never totally went away and cough is returning over last week. Complains of HA, hemoptysis with bright red blood x1 2 days ago , prod cough with green mucus, wheezing x1 week.  She had a PFT today that showed preserved lung fxn -mild COPD - FEV1 2.19 L/M (80%), ratio of 66. No sign. Change with SABA . DLCO 65%. She is followed by Dr. Gwenyth Bouillon for her lung cancer. Last CT chest 02/2011 showed Similar to minimal enlargement of a right paratracheal lymph node.. No evidence of new or progressive disease within  the chest. . Scattered pulmonary nodules which are stable and nonspecific. Next follow up CT chest in 06/2011 . She does admit she restarted smoking for short time but quit 1 month ago "for good" . REC: PRED TAPER and AVELOX  OV 06/02/2011: Folloowup COPD, Lung cancer, Smoking. She is transferring care from Dr Sherene Sires to Dr. Marchelle Gearing  C/o bilateral ankle edema since 2 days. This is new. She is surprised because she is on aldactone for 5 years due to thyroid related pedal edema. Edema associated with calf ache that is "burny" bilaterally that is constant. The pain is helped with advil and elevating feet helps with edema. Pedal edema worst end of day. Currently in office she states edema is very mild but end of day severe like a "balloon". Denies recent travel, hormone replacement pills, recent surgery, recent immobilization, hemoptysis, chest pain or wheeze or fever, runny nose, syncope  [place] COPD: unchanged class 2 dyspnea and cough with mild white sputum. Compliant with symbicort and singulair. Denies current smoking. For cancer: next chest planned in sept 2012 by Dr Shirline Frees.    Past Medical History  Diagnosis Date  . Lung cancer     lung ca dx 8/10  . Diabetes mellitus   . Anxiety   . Hyperlipidemia   . Hypothyroidism   . COPD (chronic obstructive pulmonary disease)      Family History  Problem Relation Age of Onset  .  Colon cancer Mother      History   Social History  . Marital Status: Divorced    Spouse Name: N/A    Number of Children: N/A  . Years of Education: N/A   Occupational History  . transportation coordinator    Social History Main Topics  . Smoking status: Former Smoker -- 1.0 packs/day for 40 years    Types: Cigarettes    Quit date: 07/18/2009  . Smokeless tobacco: Not on file  . Alcohol Use: No  . Drug Use: No  . Sexually Active: Not on file   Other Topics Concern  . Not on file   Social History Narrative  . No narrative on file     Allergies    Allergen Reactions  . Codeine   . Penicillins   . Sulfonamide Derivatives      Outpatient Prescriptions Prior to Visit  Medication Sig Dispense Refill  . albuterol (VENTOLIN HFA) 108 (90 BASE) MCG/ACT inhaler Inhale 2 puffs into the lungs every 6 (six) hours as needed.        Marland Kitchen aspirin 325 MG tablet Take 325 mg by mouth daily.        . budesonide-formoterol (SYMBICORT) 160-4.5 MCG/ACT inhaler Inhale 2 puffs into the lungs 2 (two) times daily.  1 Inhaler  1  . Chromium 1000 MCG TABS Take 1 tablet by mouth daily.        . diclofenac-misoprostol (ARTHROTEC 75) 75-0.2 MG per tablet Take 1 tablet by mouth 2 (two) times daily.        . fenofibrate (TRICOR) 145 MG tablet Take 145 mg by mouth daily.        . Guaifenesin (MUCINEX MAXIMUM STRENGTH) 1200 MG TB12 Take 1 tablet by mouth every 12 (twelve) hours as needed.        Marland Kitchen levothyroxine (SYNTHROID, LEVOTHROID) 125 MCG tablet Take 125 mcg by mouth daily.        Marland Kitchen LORazepam (ATIVAN) 1 MG tablet Take 1 mg by mouth every 6 (six) hours as needed.        . magnesium gluconate (MAGONATE) 500 MG tablet Take 1,000 mg by mouth daily.       . metFORMIN (GLUCOPHAGE) 500 MG tablet Take 500 mg by mouth 2 (two) times daily with a meal.        . montelukast (SINGULAIR) 10 MG tablet Take 10 mg by mouth at bedtime.        . Multiple Vitamins-Minerals (CENTRUM SILVER PO) Take 1 tablet by mouth daily.        . niacin (NIASPAN) 1000 MG CR tablet Take 1,000 mg by mouth at bedtime.        . sertraline (ZOLOFT) 100 MG tablet Take 100 mg by mouth daily.        Marland Kitchen spironolactone (ALDACTONE) 25 MG tablet Take 25 mg by mouth daily.        . travoprost, benzalkonium, (TRAVATAN) 0.004 % ophthalmic solution Place 1 drop into both eyes at bedtime.        . vitamin B-12 (CYANOCOBALAMIN) 500 MCG tablet Take 500 mcg by mouth daily.            Past Medical History:  Anxiety  Hyperlipidemia  Hypothyroidism  Diabetes mellitus, type II  COPD  - HFA 50% May 09, 2009  RUL  Nodule/localized wheeze  - First noted symptoms 02/2009  - FOB 06/12/09 > friable mucosa along distal lateral rul > POS SQUAMOUS CELL CA  [facility] Lung CA  distal right lat tracheal wall  -Reffered to [facility] June 16, 2009 > no further records avail > requested February 09, 2010  S/p chemo and radiation        Review of Systems  Constitutional: Negative for fever and unexpected weight change.  HENT: Negative for ear pain, nosebleeds, congestion, sore throat, rhinorrhea, sneezing, trouble swallowing, dental problem, postnasal drip and sinus pressure.   Eyes: Negative for redness and itching.  Respiratory: Negative for cough, chest tightness, shortness of breath and wheezing.   Cardiovascular: Positive for leg swelling. Negative for palpitations.  Gastrointestinal: Negative for nausea and vomiting.  Genitourinary: Negative for dysuria.  Musculoskeletal: Negative for joint swelling.  Skin: Negative for rash.  Neurological: Negative for headaches.  Hematological: Does not bruise/bleed easily.  Psychiatric/Behavioral: Negative for dysphoric mood. The patient is not nervous/anxious.        Objective:   Physical Exam  Vitals reviewed. Constitutional: She is oriented to person, place, and time. She appears well-developed and well-nourished. No distress.  HENT:  Head: Normocephalic and atraumatic.  Right Ear: External ear normal.  Left Ear: External ear normal.  Mouth/Throat: Oropharynx is clear and moist. No oropharyngeal exudate.  Eyes: Conjunctivae and EOM are normal. Pupils are equal, round, and reactive to light. Right eye exhibits no discharge. Left eye exhibits no discharge. No scleral icterus.  Neck: Normal range of motion. Neck supple. No JVD present. No tracheal deviation present. No thyromegaly present.  Cardiovascular: Normal rate, regular rhythm, normal heart sounds and intact distal pulses.  Exam reveals no gallop and no friction rub.   No murmur heard. Pulmonary/Chest: Effort normal  and breath sounds normal. No respiratory distress. She has no wheezes. She has no rales. She exhibits no tenderness.  Abdominal: Soft. Bowel sounds are normal. She exhibits no distension and no mass. There is no tenderness. There is no rebound and no guarding.  Musculoskeletal: Normal range of motion. She exhibits edema. She exhibits no tenderness.       One  plus edema  Lymphadenopathy:    She has no cervical adenopathy.  Neurological: She is alert and oriented to person, place, and time. She has normal reflexes. No cranial nerve deficit. She exhibits normal muscle tone. Coordination normal.  Skin: Skin is warm and dry. No rash noted. She is not diaphoretic. No erythema. No pallor.  Psychiatric: She has a normal mood and affect. Her behavior is normal. Judgment and thought content normal.          Assessment & Plan:

## 2011-06-02 NOTE — Assessment & Plan Note (Signed)
Next ct sept 2012 per Dr. Shirline Frees

## 2011-07-01 ENCOUNTER — Encounter (HOSPITAL_BASED_OUTPATIENT_CLINIC_OR_DEPARTMENT_OTHER): Payer: Self-pay | Admitting: Internal Medicine

## 2011-07-01 ENCOUNTER — Other Ambulatory Visit: Payer: Self-pay | Admitting: Internal Medicine

## 2011-07-01 ENCOUNTER — Ambulatory Visit (HOSPITAL_COMMUNITY)
Admission: RE | Admit: 2011-07-01 | Discharge: 2011-07-01 | Disposition: A | Discharge status: Home / Self Care | Payer: Self-pay | Source: Ambulatory Visit | Attending: Internal Medicine | Admitting: Internal Medicine

## 2011-07-01 ENCOUNTER — Encounter (HOSPITAL_COMMUNITY): Payer: Self-pay

## 2011-07-01 DIAGNOSIS — C3410 Malignant neoplasm of upper lobe, unspecified bronchus or lung: Secondary | ICD-10-CM

## 2011-07-01 DIAGNOSIS — C3490 Malignant neoplasm of unspecified part of unspecified bronchus or lung: Secondary | ICD-10-CM | POA: Insufficient documentation

## 2011-07-01 DIAGNOSIS — E0789 Other specified disorders of thyroid: Secondary | ICD-10-CM | POA: Insufficient documentation

## 2011-07-01 LAB — CMP (CANCER CENTER ONLY)
AST: 31 U/L (ref 11–38)
BUN, Bld: 14 mg/dL (ref 7–22)
CO2: 25 mEq/L (ref 18–33)
Calcium: 8.8 mg/dL (ref 8.0–10.3)
Chloride: 102 mEq/L (ref 98–108)
Creat: 0.7 mg/dl (ref 0.6–1.2)
Total Bilirubin: 0.4 mg/dl (ref 0.20–1.60)

## 2011-07-01 LAB — CBC WITH DIFFERENTIAL/PLATELET
Basophils Absolute: 0 10*3/uL (ref 0.0–0.1)
EOS%: 3.7 % (ref 0.0–7.0)
HCT: 38.2 % (ref 34.8–46.6)
HGB: 13.1 g/dL (ref 11.6–15.9)
LYMPH%: 23.5 % (ref 14.0–49.7)
MCH: 30 pg (ref 25.1–34.0)
NEUT%: 64.1 % (ref 38.4–76.8)
Platelets: 214 10*3/uL (ref 145–400)
lymph#: 1.4 10*3/uL (ref 0.9–3.3)

## 2011-07-01 MED ORDER — IOHEXOL 300 MG/ML  SOLN
80.0000 mL | Freq: Once | INTRAMUSCULAR | Status: AC | PRN
  Administered 2011-07-01: 80 mL via INTRAVENOUS

## 2011-07-07 ENCOUNTER — Other Ambulatory Visit: Payer: Self-pay | Admitting: Internal Medicine

## 2011-07-07 ENCOUNTER — Encounter (HOSPITAL_BASED_OUTPATIENT_CLINIC_OR_DEPARTMENT_OTHER): Payer: Self-pay | Admitting: Internal Medicine

## 2011-07-07 DIAGNOSIS — C3490 Malignant neoplasm of unspecified part of unspecified bronchus or lung: Secondary | ICD-10-CM

## 2011-07-07 DIAGNOSIS — C3410 Malignant neoplasm of upper lobe, unspecified bronchus or lung: Secondary | ICD-10-CM

## 2011-07-13 ENCOUNTER — Encounter (HOSPITAL_COMMUNITY)
Admission: RE | Admit: 2011-07-13 | Discharge: 2011-07-13 | Disposition: A | Discharge status: Home / Self Care | Payer: Self-pay | Source: Ambulatory Visit | Attending: Internal Medicine | Admitting: Internal Medicine

## 2011-07-13 DIAGNOSIS — R918 Other nonspecific abnormal finding of lung field: Secondary | ICD-10-CM | POA: Insufficient documentation

## 2011-07-13 DIAGNOSIS — C3490 Malignant neoplasm of unspecified part of unspecified bronchus or lung: Secondary | ICD-10-CM | POA: Insufficient documentation

## 2011-07-13 MED ORDER — FLUDEOXYGLUCOSE F - 18 (FDG) INJECTION
18.5000 | Freq: Once | INTRAVENOUS | Status: AC | PRN
  Administered 2011-07-13: 18.5 via INTRAVENOUS

## 2011-07-14 ENCOUNTER — Encounter (HOSPITAL_BASED_OUTPATIENT_CLINIC_OR_DEPARTMENT_OTHER): Payer: Self-pay | Admitting: Internal Medicine

## 2011-07-14 DIAGNOSIS — C3410 Malignant neoplasm of upper lobe, unspecified bronchus or lung: Secondary | ICD-10-CM

## 2011-07-14 DIAGNOSIS — R599 Enlarged lymph nodes, unspecified: Secondary | ICD-10-CM

## 2011-07-19 ENCOUNTER — Encounter: Payer: Self-pay | Admitting: Thoracic Surgery

## 2011-07-22 ENCOUNTER — Encounter: Payer: Self-pay | Admitting: Thoracic Surgery

## 2011-07-22 ENCOUNTER — Ambulatory Visit (INDEPENDENT_AMBULATORY_CARE_PROVIDER_SITE_OTHER): Payer: Self-pay | Admitting: Thoracic Surgery

## 2011-07-22 VITALS — BP 141/77 | HR 116 | Resp 18 | Ht 68.0 in | Wt 192.0 lb

## 2011-07-22 DIAGNOSIS — R599 Enlarged lymph nodes, unspecified: Secondary | ICD-10-CM

## 2011-07-22 DIAGNOSIS — R591 Generalized enlarged lymph nodes: Secondary | ICD-10-CM

## 2011-07-22 DIAGNOSIS — C3490 Malignant neoplasm of unspecified part of unspecified bronchus or lung: Secondary | ICD-10-CM

## 2011-07-22 NOTE — Progress Notes (Signed)
PCP is No primary provider on file. Referring Provider is Lajuana Matte., MD  Chief Complaint  Patient presents with  . Lung Cancer  . Lymphadenopathy    referred for tissue diagnosis    HPI: This 55 year old patient has a long history of smoking was found to have a right upper lobe non-small cell lung cancer with the mediastinal adenopathy she underwent radiation and chemotherapy therapy and August 2010 and was thought to be a squamous cell cancer. Followup CT scan showed an enlarging right peritracheal adenopathy. She is referred here for a evaluation. A PET scan was positive in the right 4R node. We plan to do by proceeding with bronchoscopy with endobronchial ultrasound. We will do this on October 11. Had no hemoptysis fever chills or excessive sputum. Her weight has been stable.   Past Medical History  Diagnosis Date  . Diabetes mellitus   . Anxiety   . Hyperlipidemia   . Hypothyroidism   . COPD (chronic obstructive pulmonary disease)   . Lung cancer     lung ca dx 8/10    Past Surgical History  Procedure Date  . Total abdominal hysterectomy   . Thyroidectomy, partial   . Tonsillectomy   . Tubal ligation     Family History  Problem Relation Age of Onset  . Colon cancer Mother     Social History History  Substance Use Topics  . Smoking status: Former Smoker -- 1.0 packs/day for 40 years    Types: Cigarettes    Quit date: 07/18/2009  . Smokeless tobacco: Not on file  . Alcohol Use: No    Current Outpatient Prescriptions  Medication Sig Dispense Refill  . albuterol (VENTOLIN HFA) 108 (90 BASE) MCG/ACT inhaler Inhale 2 puffs into the lungs every 6 (six) hours as needed.        Marland Kitchen aspirin 325 MG tablet Take 325 mg by mouth daily.        . budesonide-formoterol (SYMBICORT) 160-4.5 MCG/ACT inhaler Inhale 2 puffs into the lungs 2 (two) times daily.  1 Inhaler  1  . Chromium 1000 MCG TABS Take 1 tablet by mouth daily.        . diclofenac-misoprostol (ARTHROTEC 75)  75-0.2 MG per tablet Take 1 tablet by mouth 2 (two) times daily.        . fenofibrate (TRICOR) 145 MG tablet Take 145 mg by mouth daily.        . Guaifenesin (MUCINEX MAXIMUM STRENGTH) 1200 MG TB12 Take 1 tablet by mouth every 12 (twelve) hours as needed.        Marland Kitchen levothyroxine (SYNTHROID, LEVOTHROID) 125 MCG tablet Take 125 mcg by mouth daily.        Marland Kitchen LORazepam (ATIVAN) 1 MG tablet Take 1 mg by mouth every 6 (six) hours as needed.        . magnesium gluconate (MAGONATE) 500 MG tablet Take 1,000 mg by mouth daily.       . metFORMIN (GLUCOPHAGE) 500 MG tablet Take 500 mg by mouth 2 (two) times daily with a meal.        . montelukast (SINGULAIR) 10 MG tablet Take 10 mg by mouth at bedtime.        . Multiple Vitamins-Minerals (CENTRUM SILVER PO) Take 1 tablet by mouth daily.        . niacin (NIASPAN) 1000 MG CR tablet Take 1,000 mg by mouth at bedtime.        . sertraline (ZOLOFT) 100 MG tablet Take 100 mg  by mouth daily.        Marland Kitchen spironolactone (ALDACTONE) 25 MG tablet Take 25 mg by mouth daily.        . travoprost, benzalkonium, (TRAVATAN) 0.004 % ophthalmic solution Place 1 drop into both eyes at bedtime.        . vitamin B-12 (CYANOCOBALAMIN) 500 MCG tablet Take 500 mcg by mouth daily.          Allergies  Allergen Reactions  . Sulfonamide Derivatives Rash  . Codeine Other (See Comments)    hallucinations  . Penicillins     As a child    Review of Systems  Constitutional: Negative.   HENT: Negative.   Eyes: Negative.   Respiratory: Negative for cough, choking, chest tightness and shortness of breath.   Cardiovascular: Negative.   Gastrointestinal: Negative.   Genitourinary: Negative.   Musculoskeletal: Negative.   Neurological: Negative.   Hematological: Negative.   Psychiatric/Behavioral: Negative.     BP 141/77  Pulse 116  Resp 18  Ht 5\' 8"  (1.727 m)  Wt 192 lb (87.091 kg)  BMI 29.19 kg/m2  SpO2 92% Physical Exam  Constitutional: She is oriented to person, place, and  time. She appears well-developed and well-nourished.  HENT:  Head: Normocephalic and atraumatic.  Right Ear: External ear normal.  Left Ear: External ear normal.  Mouth/Throat: Oropharynx is clear and moist.  Neck: Normal range of motion. Neck supple. No tracheal deviation present.  Cardiovascular: Normal rate, regular rhythm and normal heart sounds.   Pulmonary/Chest: Effort normal and breath sounds normal. No respiratory distress.  Abdominal: Soft. Bowel sounds are normal.  Musculoskeletal: Normal range of motion.  Neurological: She is alert and oriented to person, place, and time. She has normal reflexes.  Skin: Skin is warm.  Psychiatric: She has a normal mood and affect. Her behavior is normal. Judgment and thought content normal.   thyroid scar from previous thyroid thyroidectomy   Diagnostic Tests: PET scan shows right peritracheal adenopathy with increased uptake   Impression: Status post radiation and chemotherapy for stage IIIa squamous cell cancer probable recurrent right peritracheal lymph node   Plan bronchoscopy bronchoscopy with endobronchial ultrasound

## 2011-07-26 ENCOUNTER — Other Ambulatory Visit (HOSPITAL_COMMUNITY): Payer: Self-pay

## 2011-07-26 ENCOUNTER — Other Ambulatory Visit: Payer: Self-pay | Admitting: Thoracic Surgery

## 2011-07-26 ENCOUNTER — Encounter (HOSPITAL_COMMUNITY)
Admission: RE | Admit: 2011-07-26 | Discharge: 2011-07-26 | Disposition: A | Discharge status: Home / Self Care | Payer: Self-pay | Source: Ambulatory Visit | Attending: Thoracic Surgery | Admitting: Thoracic Surgery

## 2011-07-26 DIAGNOSIS — R591 Generalized enlarged lymph nodes: Secondary | ICD-10-CM

## 2011-07-26 LAB — COMPREHENSIVE METABOLIC PANEL
ALT: 31 U/L (ref 0–35)
Alkaline Phosphatase: 63 U/L (ref 39–117)
CO2: 29 mEq/L (ref 19–32)
Chloride: 100 mEq/L (ref 96–112)
GFR calc Af Amer: >90 mL/min (ref >90)
Glucose, Bld: 178 mg/dL — ABNORMAL HIGH (ref 70–99)
Potassium: 4.4 mEq/L (ref 3.5–5.1)
Sodium: 141 mEq/L (ref 135–145)
Total Bilirubin: 0.2 mg/dL — ABNORMAL LOW (ref 0.3–1.2)
Total Protein: 7.4 g/dL (ref 6.0–8.3)

## 2011-07-26 LAB — CBC
HCT: 40 % (ref 36.0–46.0)
Hemoglobin: 13.6 g/dL (ref 12.0–15.0)
MCH: 29.8 pg (ref 26.0–34.0)
MCV: 87.7 fL (ref 78.0–100.0)
RBC: 4.56 MIL/uL (ref 3.87–5.11)

## 2011-07-26 LAB — PROTIME-INR: Prothrombin Time: 12.6 seconds (ref 11.6–15.2)

## 2011-07-29 ENCOUNTER — Other Ambulatory Visit: Payer: Self-pay | Admitting: Thoracic Surgery

## 2011-07-29 ENCOUNTER — Ambulatory Visit (HOSPITAL_COMMUNITY)
Admission: RE | Admit: 2011-07-29 | Discharge: 2011-07-29 | Disposition: A | Discharge status: Home / Self Care | Payer: Self-pay | Source: Ambulatory Visit | Attending: Thoracic Surgery | Admitting: Thoracic Surgery

## 2011-07-29 DIAGNOSIS — J4489 Other specified chronic obstructive pulmonary disease: Secondary | ICD-10-CM | POA: Insufficient documentation

## 2011-07-29 DIAGNOSIS — R599 Enlarged lymph nodes, unspecified: Secondary | ICD-10-CM

## 2011-07-29 DIAGNOSIS — C3490 Malignant neoplasm of unspecified part of unspecified bronchus or lung: Secondary | ICD-10-CM | POA: Insufficient documentation

## 2011-07-29 DIAGNOSIS — C771 Secondary and unspecified malignant neoplasm of intrathoracic lymph nodes: Secondary | ICD-10-CM | POA: Insufficient documentation

## 2011-07-29 DIAGNOSIS — E119 Type 2 diabetes mellitus without complications: Secondary | ICD-10-CM | POA: Insufficient documentation

## 2011-07-29 DIAGNOSIS — Z01810 Encounter for preprocedural cardiovascular examination: Secondary | ICD-10-CM | POA: Insufficient documentation

## 2011-07-29 DIAGNOSIS — J449 Chronic obstructive pulmonary disease, unspecified: Secondary | ICD-10-CM | POA: Insufficient documentation

## 2011-07-29 DIAGNOSIS — Z01812 Encounter for preprocedural laboratory examination: Secondary | ICD-10-CM | POA: Insufficient documentation

## 2011-07-29 LAB — GLUCOSE, CAPILLARY

## 2011-07-29 NOTE — Op Note (Signed)
  NAMEMELAYNA, ROBARTS NO.:  [id number]  MEDICAL RECORD NO.:  [id number]  LOCATION:  [facility]                         FACILITY:  [facility]  PHYSICIAN:  Ines Bloomer, M.D. DATE OF BIRTH:  1955/12/27  DATE OF PROCEDURE: DATE OF DISCHARGE:                              OPERATIVE REPORT   PREOPERATIVE DIAGNOSIS:  Status post radiation and chemotherapy for stage IIIA non-small cell lung cancer, possible recurrence.  POSTOPERATIVE DIAGNOSIS:  Status post radiation and chemotherapy for stage IIIA non-small cell lung cancer, possible recurrence.  OPERATION PERFORMED:  Fiberoptic bronchoscopy with endobronchial ultrasound.  After general anesthesia, the video bronchoscope was passed through the endotracheal tube.  The carina was in midline.  The left mainstem, left upper lobe, and left lower lobe orifices were normal.  The right upper lobe, right middle lobe, and right lower lobe orifices were normal.  The video bronchoscope was removed.  The endobronchial ultrasound was inserted.  This patient had a PET positive 4R node and we identified a 2- cm node in the right peritracheal area in the 4R area.  We then passed the sheath and the sheath out through the working channel and extended out so we could see the sheath and then passed the needle under ultrasound guidance into this large node.  We did 5 separate passes. After each pass we brought the needle back into the sheath and removed the sheath and then we did this again on 5 separate times.  We sent this for pathological examination.  We removed the endobronchial ultrasound. The patient tolerated procedure well, was returned to recovery room in a stable condition.     Ines Bloomer, M.D.     DPB/MEDQ  D:  07/29/2011  T:  07/29/2011  Job:  [id number]  Electronically Signed by Jovita Gamma M.D. on 07/29/2011 04:53:45 PM

## 2011-08-01 LAB — CULTURE, RESPIRATORY W GRAM STAIN

## 2011-08-03 ENCOUNTER — Other Ambulatory Visit: Payer: Self-pay | Admitting: Internal Medicine

## 2011-08-03 ENCOUNTER — Ambulatory Visit: Payer: Self-pay | Admitting: Thoracic Surgery

## 2011-08-03 ENCOUNTER — Encounter (HOSPITAL_BASED_OUTPATIENT_CLINIC_OR_DEPARTMENT_OTHER): Payer: Self-pay | Admitting: Internal Medicine

## 2011-08-03 DIAGNOSIS — C3410 Malignant neoplasm of upper lobe, unspecified bronchus or lung: Secondary | ICD-10-CM

## 2011-08-03 LAB — CBC WITH DIFFERENTIAL/PLATELET
EOS%: 2.4 % (ref 0.0–7.0)
Eosinophils Absolute: 0.1 10*3/uL (ref 0.0–0.5)
MCH: 30.6 pg (ref 25.1–34.0)
MCV: 88.3 fL (ref 79.5–101.0)
MONO%: 6.2 % (ref 0.0–14.0)
NEUT#: 3.9 10*3/uL (ref 1.5–6.5)
RBC: 4.34 10*6/uL (ref 3.70–5.45)
RDW: 13.3 % (ref 11.2–14.5)

## 2011-08-03 LAB — COMPREHENSIVE METABOLIC PANEL
ALT: 36 U/L — ABNORMAL HIGH (ref 0–35)
AST: 28 U/L (ref 0–37)
Albumin: 4 g/dL (ref 3.5–5.2)
Alkaline Phosphatase: 60 U/L (ref 39–117)
Potassium: 3.8 mEq/L (ref 3.5–5.3)
Sodium: 138 mEq/L (ref 135–145)
Total Protein: 7.6 g/dL (ref 6.0–8.3)

## 2011-08-04 ENCOUNTER — Ambulatory Visit: Payer: Self-pay | Admitting: Thoracic Surgery

## 2011-08-05 ENCOUNTER — Ambulatory Visit
Admission: RE | Admit: 2011-08-05 | Discharge: 2011-08-05 | Disposition: A | Discharge status: Home / Self Care | Payer: Self-pay | Source: Ambulatory Visit | Attending: Radiation Oncology | Admitting: Radiation Oncology

## 2011-08-05 DIAGNOSIS — Z79899 Other long term (current) drug therapy: Secondary | ICD-10-CM | POA: Insufficient documentation

## 2011-08-05 DIAGNOSIS — Z510 Encounter for antineoplastic radiation therapy: Secondary | ICD-10-CM | POA: Insufficient documentation

## 2011-08-05 DIAGNOSIS — Z85118 Personal history of other malignant neoplasm of bronchus and lung: Secondary | ICD-10-CM | POA: Insufficient documentation

## 2011-08-05 DIAGNOSIS — C770 Secondary and unspecified malignant neoplasm of lymph nodes of head, face and neck: Secondary | ICD-10-CM | POA: Insufficient documentation

## 2011-08-06 ENCOUNTER — Other Ambulatory Visit: Payer: Self-pay | Admitting: Radiation Oncology

## 2011-08-06 DIAGNOSIS — C3490 Malignant neoplasm of unspecified part of unspecified bronchus or lung: Secondary | ICD-10-CM

## 2011-08-10 ENCOUNTER — Ambulatory Visit: Payer: Self-pay | Admitting: Thoracic Surgery

## 2011-08-13 ENCOUNTER — Ambulatory Visit (HOSPITAL_COMMUNITY)
Admission: RE | Admit: 2011-08-13 | Discharge: 2011-08-13 | Disposition: A | Discharge status: Home / Self Care | Payer: Self-pay | Source: Ambulatory Visit | Attending: Radiation Oncology | Admitting: Radiation Oncology

## 2011-08-13 DIAGNOSIS — C3490 Malignant neoplasm of unspecified part of unspecified bronchus or lung: Secondary | ICD-10-CM

## 2011-08-13 MED ORDER — GADOBENATE DIMEGLUMINE 529 MG/ML IV SOLN
17.0000 mL | Freq: Once | INTRAVENOUS | Status: AC | PRN
  Administered 2011-08-13: 17 mL via INTRAVENOUS

## 2011-08-16 ENCOUNTER — Other Ambulatory Visit: Payer: Self-pay | Admitting: Internal Medicine

## 2011-08-16 ENCOUNTER — Encounter (HOSPITAL_BASED_OUTPATIENT_CLINIC_OR_DEPARTMENT_OTHER): Payer: Self-pay | Admitting: Internal Medicine

## 2011-08-16 ENCOUNTER — Ambulatory Visit: Payer: Self-pay | Admitting: Thoracic Surgery

## 2011-08-16 DIAGNOSIS — Z5111 Encounter for antineoplastic chemotherapy: Secondary | ICD-10-CM

## 2011-08-16 DIAGNOSIS — C3410 Malignant neoplasm of upper lobe, unspecified bronchus or lung: Secondary | ICD-10-CM

## 2011-08-16 LAB — CBC WITH DIFFERENTIAL/PLATELET
Basophils Absolute: 0.1 10*3/uL (ref 0.0–0.1)
EOS%: 4 % (ref 0.0–7.0)
MCH: 29.3 pg (ref 25.1–34.0)
MCV: 86.5 fL (ref 79.5–101.0)
MONO%: 7.7 % (ref 0.0–14.0)
RBC: 4.58 10*6/uL (ref 3.70–5.45)
RDW: 13.2 % (ref 11.2–14.5)
nRBC: 0 % (ref 0–0)

## 2011-08-16 LAB — COMPREHENSIVE METABOLIC PANEL
Alkaline Phosphatase: 58 U/L (ref 39–117)
Glucose, Bld: 178 mg/dL — ABNORMAL HIGH (ref 70–99)
Sodium: 141 mEq/L (ref 135–145)
Total Bilirubin: 0.2 mg/dL — ABNORMAL LOW (ref 0.3–1.2)
Total Protein: 6.9 g/dL (ref 6.0–8.3)

## 2011-08-17 ENCOUNTER — Ambulatory Visit: Payer: Self-pay | Admitting: Thoracic Surgery

## 2011-08-17 ENCOUNTER — Encounter: Payer: Self-pay | Admitting: *Deleted

## 2011-08-22 ENCOUNTER — Other Ambulatory Visit: Payer: Self-pay | Admitting: Internal Medicine

## 2011-08-22 DIAGNOSIS — C3490 Malignant neoplasm of unspecified part of unspecified bronchus or lung: Secondary | ICD-10-CM

## 2011-08-23 ENCOUNTER — Other Ambulatory Visit: Payer: Self-pay | Admitting: Internal Medicine

## 2011-08-23 ENCOUNTER — Ambulatory Visit (HOSPITAL_BASED_OUTPATIENT_CLINIC_OR_DEPARTMENT_OTHER): Payer: Self-pay

## 2011-08-23 ENCOUNTER — Ambulatory Visit
Admission: RE | Admit: 2011-08-23 | Discharge: 2011-08-23 | Disposition: A | Discharge status: Home / Self Care | Payer: Self-pay | Source: Ambulatory Visit | Attending: Radiation Oncology | Admitting: Radiation Oncology

## 2011-08-23 ENCOUNTER — Other Ambulatory Visit (HOSPITAL_BASED_OUTPATIENT_CLINIC_OR_DEPARTMENT_OTHER): Payer: Self-pay | Admitting: Lab

## 2011-08-23 VITALS — BP 127/76 | HR 108 | Temp 97.8°F

## 2011-08-23 DIAGNOSIS — C3410 Malignant neoplasm of upper lobe, unspecified bronchus or lung: Secondary | ICD-10-CM

## 2011-08-23 DIAGNOSIS — Z5111 Encounter for antineoplastic chemotherapy: Secondary | ICD-10-CM

## 2011-08-23 DIAGNOSIS — C3490 Malignant neoplasm of unspecified part of unspecified bronchus or lung: Secondary | ICD-10-CM

## 2011-08-23 LAB — CBC WITH DIFFERENTIAL/PLATELET
Basophils Absolute: 0.1 10*3/uL (ref 0.0–0.1)
Eosinophils Absolute: 0.2 10*3/uL (ref 0.0–0.5)
HCT: 38.2 % (ref 34.8–46.6)
LYMPH%: 27.8 % (ref 14.0–49.7)
MCV: 86.8 fL (ref 79.5–101.0)
MONO%: 5.9 % (ref 0.0–14.0)
NEUT#: 3.5 10*3/uL (ref 1.5–6.5)
NEUT%: 62.1 % (ref 38.4–76.8)
Platelets: 178 10*3/uL (ref 145–400)
RBC: 4.4 10*6/uL (ref 3.70–5.45)

## 2011-08-23 LAB — COMPREHENSIVE METABOLIC PANEL
Alkaline Phosphatase: 51 U/L (ref 39–117)
BUN: 12 mg/dL (ref 6–23)
Creatinine, Ser: 0.68 mg/dL (ref 0.50–1.10)
Glucose, Bld: 232 mg/dL — ABNORMAL HIGH (ref 70–99)
Sodium: 138 mEq/L (ref 135–145)
Total Bilirubin: 0.3 mg/dL (ref 0.3–1.2)

## 2011-08-23 MED ORDER — PACLITAXEL CHEMO INJECTION 300 MG/50ML
45.0000 mg/m2 | Freq: Once | INTRAVENOUS | Status: AC
  Administered 2011-08-23: 90 mg via INTRAVENOUS
  Filled 2011-08-23: qty 15

## 2011-08-23 MED ORDER — FAMOTIDINE IN NACL 20-0.9 MG/50ML-% IV SOLN
20.0000 mg | Freq: Once | INTRAVENOUS | Status: AC
  Administered 2011-08-23: 20 mg via INTRAVENOUS

## 2011-08-23 MED ORDER — DIPHENHYDRAMINE HCL 50 MG/ML IJ SOLN
50.0000 mg | Freq: Once | INTRAMUSCULAR | Status: AC
  Administered 2011-08-23: 50 mg via INTRAVENOUS

## 2011-08-23 MED ORDER — DEXAMETHASONE SODIUM PHOSPHATE 4 MG/ML IJ SOLN
20.0000 mg | Freq: Once | INTRAMUSCULAR | Status: AC
  Administered 2011-08-23: 20 mg via INTRAVENOUS

## 2011-08-23 MED ORDER — ONDANSETRON 16 MG/50ML IVPB (CHCC)
16.0000 mg | Freq: Once | INTRAVENOUS | Status: AC
  Administered 2011-08-23: 16 mg via INTRAVENOUS

## 2011-08-23 MED ORDER — SODIUM CHLORIDE 0.9 % IV SOLN
270.0000 mg | Freq: Once | INTRAVENOUS | Status: AC
  Administered 2011-08-23: 270 mg via INTRAVENOUS
  Filled 2011-08-23: qty 27

## 2011-08-24 ENCOUNTER — Ambulatory Visit
Admission: RE | Admit: 2011-08-24 | Discharge: 2011-08-24 | Disposition: A | Discharge status: Home / Self Care | Payer: Self-pay | Source: Ambulatory Visit | Attending: Radiation Oncology | Admitting: Radiation Oncology

## 2011-08-24 ENCOUNTER — Encounter: Payer: Self-pay | Admitting: Certified Registered Nurse Anesthetist

## 2011-08-25 ENCOUNTER — Ambulatory Visit
Admission: RE | Admit: 2011-08-25 | Discharge: 2011-08-25 | Disposition: A | Discharge status: Home / Self Care | Payer: Self-pay | Source: Ambulatory Visit | Attending: Radiation Oncology | Admitting: Radiation Oncology

## 2011-08-26 ENCOUNTER — Ambulatory Visit
Admission: RE | Admit: 2011-08-26 | Discharge: 2011-08-26 | Disposition: A | Discharge status: Home / Self Care | Payer: Self-pay | Source: Ambulatory Visit | Attending: Radiation Oncology | Admitting: Radiation Oncology

## 2011-08-27 ENCOUNTER — Other Ambulatory Visit: Payer: Self-pay | Admitting: Physician Assistant

## 2011-08-27 ENCOUNTER — Ambulatory Visit
Admission: RE | Admit: 2011-08-27 | Discharge: 2011-08-27 | Disposition: A | Discharge status: Home / Self Care | Payer: Self-pay | Source: Ambulatory Visit | Attending: Radiation Oncology | Admitting: Radiation Oncology

## 2011-08-27 ENCOUNTER — Encounter: Payer: Self-pay | Admitting: Radiation Oncology

## 2011-08-27 ENCOUNTER — Encounter: Payer: Self-pay | Admitting: *Deleted

## 2011-08-27 VITALS — Wt 191.5 lb

## 2011-08-27 DIAGNOSIS — C3490 Malignant neoplasm of unspecified part of unspecified bronchus or lung: Secondary | ICD-10-CM

## 2011-08-27 NOTE — Progress Notes (Signed)
Weekly Radiation Therapy Management  Current Dose: 20 Gy     Planned Dose:  60 Gy  Narrative . . . . . . . .The patient presents for routine under treatment assessment.   Port film x-rays were reviewed.  The chart was checked. The patient denies esophageal symptoms. Physical Findings. . Weight Stable.  No significant changes. Impression . . . . . . . The patient is  tolerating radiation. Plan . . . . . . . . . .  Continue treatment as planned.

## 2011-08-27 NOTE — Progress Notes (Signed)
PT HAS MILD FATIGUE STATED RECENTLY, COUGHS CLEAR/CLOUDY PHEGLM, NO C/O PAIN

## 2011-08-30 ENCOUNTER — Ambulatory Visit
Admission: RE | Admit: 2011-08-30 | Discharge: 2011-08-30 | Disposition: A | Discharge status: Home / Self Care | Payer: Self-pay | Source: Ambulatory Visit | Attending: Radiation Oncology | Admitting: Radiation Oncology

## 2011-08-30 ENCOUNTER — Ambulatory Visit (HOSPITAL_BASED_OUTPATIENT_CLINIC_OR_DEPARTMENT_OTHER): Payer: Self-pay | Admitting: Lab

## 2011-08-30 ENCOUNTER — Ambulatory Visit (HOSPITAL_BASED_OUTPATIENT_CLINIC_OR_DEPARTMENT_OTHER): Payer: Self-pay | Admitting: Physician Assistant

## 2011-08-30 ENCOUNTER — Ambulatory Visit (HOSPITAL_BASED_OUTPATIENT_CLINIC_OR_DEPARTMENT_OTHER): Payer: Self-pay

## 2011-08-30 ENCOUNTER — Telehealth: Payer: Self-pay | Admitting: Internal Medicine

## 2011-08-30 ENCOUNTER — Other Ambulatory Visit: Payer: Self-pay | Admitting: Internal Medicine

## 2011-08-30 VITALS — BP 114/67 | HR 115 | Temp 98.4°F | Ht 68.0 in | Wt 189.7 lb

## 2011-08-30 DIAGNOSIS — B370 Candidal stomatitis: Secondary | ICD-10-CM

## 2011-08-30 DIAGNOSIS — C3410 Malignant neoplasm of upper lobe, unspecified bronchus or lung: Secondary | ICD-10-CM

## 2011-08-30 DIAGNOSIS — C3490 Malignant neoplasm of unspecified part of unspecified bronchus or lung: Secondary | ICD-10-CM

## 2011-08-30 DIAGNOSIS — Z5111 Encounter for antineoplastic chemotherapy: Secondary | ICD-10-CM

## 2011-08-30 LAB — CBC WITH DIFFERENTIAL/PLATELET
Basophils Absolute: 0 10*3/uL (ref 0.0–0.1)
Eosinophils Absolute: 0.1 10*3/uL (ref 0.0–0.5)
HCT: 36.5 % (ref 34.8–46.6)
HGB: 12.5 g/dL (ref 11.6–15.9)
LYMPH%: 16 % (ref 14.0–49.7)
MCV: 86.3 fL (ref 79.5–101.0)
MONO#: 0.4 10*3/uL (ref 0.1–0.9)
MONO%: 5.6 % (ref 0.0–14.0)
NEUT#: 6.1 10*3/uL (ref 1.5–6.5)
Platelets: 161 10*3/uL (ref 145–400)
WBC: 7.9 10*3/uL (ref 3.9–10.3)

## 2011-08-30 LAB — COMPREHENSIVE METABOLIC PANEL
ALT: 26 U/L (ref 0–35)
AST: 16 U/L (ref 0–37)
Albumin: 3.8 g/dL (ref 3.5–5.2)
Alkaline Phosphatase: 63 U/L (ref 39–117)
BUN: 8 mg/dL (ref 6–23)
Calcium: 8.9 mg/dL (ref 8.4–10.5)
Chloride: 100 mEq/L (ref 96–112)
Potassium: 4.4 mEq/L (ref 3.5–5.3)

## 2011-08-30 MED ORDER — SODIUM CHLORIDE 0.9 % IV SOLN
270.0000 mg | Freq: Once | INTRAVENOUS | Status: AC
  Administered 2011-08-30: 270 mg via INTRAVENOUS
  Filled 2011-08-30: qty 27

## 2011-08-30 MED ORDER — ONDANSETRON 16 MG/50ML IVPB (CHCC)
16.0000 mg | Freq: Once | INTRAVENOUS | Status: AC
  Administered 2011-08-30: 16 mg via INTRAVENOUS

## 2011-08-30 MED ORDER — DIPHENHYDRAMINE HCL 50 MG/ML IJ SOLN
50.0000 mg | Freq: Once | INTRAMUSCULAR | Status: AC
  Administered 2011-08-30: 50 mg via INTRAVENOUS

## 2011-08-30 MED ORDER — DEXAMETHASONE SODIUM PHOSPHATE 4 MG/ML IJ SOLN
20.0000 mg | Freq: Once | INTRAMUSCULAR | Status: AC
  Administered 2011-08-30: 20 mg via INTRAVENOUS

## 2011-08-30 MED ORDER — METHYLPREDNISOLONE SODIUM SUCC 125 MG IJ SOLR
125.0000 mg | Freq: Once | INTRAMUSCULAR | Status: AC | PRN
  Administered 2011-08-30: 125 mg via INTRAVENOUS

## 2011-08-30 MED ORDER — MAGIC MOUTHWASH: 5.0000 mL | Freq: Four times a day (QID) | ORAL | Status: DC

## 2011-08-30 MED ORDER — PACLITAXEL CHEMO INJECTION 300 MG/50ML
45.0000 mg/m2 | Freq: Once | INTRAVENOUS | Status: AC
  Administered 2011-08-30: 90 mg via INTRAVENOUS
  Filled 2011-08-30: qty 15

## 2011-08-30 MED ORDER — FAMOTIDINE IN NACL 20-0.9 MG/50ML-% IV SOLN
20.0000 mg | Freq: Once | INTRAVENOUS | Status: AC
  Administered 2011-08-30: 20 mg via INTRAVENOUS

## 2011-08-30 MED ORDER — SODIUM CHLORIDE 0.9 % IJ SOLN
3.0000 mL | INTRAMUSCULAR | Status: DC | PRN
  Filled 2011-08-30: qty 10

## 2011-08-30 MED ORDER — SODIUM CHLORIDE 0.9 % IV SOLN: Freq: Once | INTRAVENOUS | Status: DC

## 2011-08-30 MED ORDER — FLUCONAZOLE 100 MG PO TABS: 100.0000 mg | ORAL_TABLET | Freq: Every day | ORAL | Status: DC

## 2011-08-30 NOTE — Progress Notes (Signed)
No images are attached to the encounter. No scans are attached to the encounter. No scans are attached to the encounter. Janice Ball  CC: Kalman Shan M.D., Ines Bloomer M.D., Oneita Hurt M.D., Dineen Kid. Reche Dixon M.D.  DIAGNOSIS: Recurrent non-small cell lung cancer, squamous cell carcinoma initially diagnosis stage IIIa (T1 N2 MX ) in August of 2010.  PRIOR THERAPY: #1 status post concurrent chemoradiation with weekly carboplatin and paclitaxel, last dose of chemotherapy given 08/05/2011. #2 status post consolidation chemotherapy with carboplatin paclitaxel last dose given 10/27/2009.  CURRENT THERAPY: Concurrent chemoradiation with weekly carboplatin for an AUC of 2 and paclitaxel at 45 mg per meter squared given concurrent with radiotherapy  INTERVAL HISTORY: Janice Ball 54 y.o. female returns for scheduled regular symptom management visit for followup of her recurrent non-small cell lung cancer, squamous cell carcinoma. Overall she is tolerating her course of concurrent chemoradiation without difficulty. She does however complain of mouth soreness primarily affecting her tongue which began on Friday. She began eating yogurt and rinsing her mouth with salt and baking soda. This gave her some mild symptomatic relief. She's had cough productive of white milky secretions not associated with fever or chills. Her shortness of breath with exertion remains unchanged and is at her baseline. She is currently scheduled to have radiation therapy through 09/27/2011. She request permission to receive a free massage.  MEDICAL HISTORY: Past Medical History  Diagnosis Date  . Diabetes mellitus   . Anxiety   . Hyperlipidemia   . Hypothyroidism   . COPD (chronic obstructive pulmonary disease)   . Lung cancer     lung ca dx 8/10    ALLERGIES:  is allergic to sulfonamide derivatives; codeine; and penicillins.  MEDICATIONS:  Current Outpatient  Prescriptions  Medication Sig Dispense Refill  . albuterol (VENTOLIN HFA) 108 (90 BASE) MCG/ACT inhaler Inhale 2 puffs into the lungs every 6 (six) hours as needed.        Marland Kitchen aspirin 325 MG tablet Take 325 mg by mouth daily.        . budesonide-formoterol (SYMBICORT) 160-4.5 MCG/ACT inhaler Inhale 2 puffs into the lungs 2 (two) times daily.  1 Inhaler  1  . Chromium 1000 MCG TABS Take 1 tablet by mouth daily.        . diclofenac-misoprostol (ARTHROTEC 75) 75-0.2 MG per tablet Take 1 tablet by mouth 2 (two) times daily.        . fenofibrate (TRICOR) 145 MG tablet Take 145 mg by mouth daily.        . Guaifenesin (MUCINEX MAXIMUM STRENGTH) 1200 MG TB12 Take 1 tablet by mouth every 12 (twelve) hours as needed.        Marland Kitchen levothyroxine (SYNTHROID, LEVOTHROID) 125 MCG tablet Take 125 mcg by mouth daily.        Marland Kitchen LORazepam (ATIVAN) 1 MG tablet Take 1 mg by mouth every 6 (six) hours as needed.        . magnesium gluconate (MAGONATE) 500 MG tablet Take 1,000 mg by mouth daily.       . metFORMIN (GLUCOPHAGE) 500 MG tablet Take 500 mg by mouth 2 (two) times daily with a meal.        . montelukast (SINGULAIR) 10 MG tablet Take 10 mg by mouth at bedtime.        . Multiple Vitamins-Minerals (CENTRUM SILVER PO) Take 1 tablet by mouth daily.        . niacin (NIASPAN) 1000  MG CR tablet Take 1,000 mg by mouth at bedtime.        . sertraline (ZOLOFT) 100 MG tablet Take 100 mg by mouth daily. Takes 1 1/2 tabs daily for total dose of 150 mg      . spironolactone (ALDACTONE) 25 MG tablet Take 25 mg by mouth daily.        . travoprost, benzalkonium, (TRAVATAN) 0.004 % ophthalmic solution Place 1 drop into both eyes at bedtime.        . vitamin B-12 (CYANOCOBALAMIN) 500 MCG tablet Take 500 mcg by mouth daily.         No current facility-administered medications for this visit.   Facility-Administered Medications Ordered in Other Visits  Medication Dose Route Frequency Provider Last Rate Last Dose  . CARBOplatin  (PARAPLATIN) 270 mg in sodium chloride 0.9 % 100 mL chemo infusion  270 mg Intravenous Once Mohamed K. Mohamed, MD   270 mg at 08/30/11 1512  . dexamethasone (DECADRON) injection 20 mg  20 mg Intravenous Once Mohamed K. Mohamed, MD   20 mg at 08/30/11 1330  . diphenhydrAMINE (BENADRYL) injection 50 mg  50 mg Intravenous Once Mohamed K. Mohamed, MD   50 mg at 08/30/11 1330  . famotidine (PEPCID) IVPB 20 mg  20 mg Intravenous Once Mohamed K. Mohamed, MD   20 mg at 08/30/11 1347  . methylPREDNISolone sodium succinate (SOLU-MEDROL) 125 MG injection 125 mg  125 mg Intravenous Once PRN Mohamed K. Mohamed, MD   125 mg at 08/30/11 1548  . ondansetron (ZOFRAN) IVPB 16 mg  16 mg Intravenous Once Mohamed K. Mohamed, MD   16 mg at 08/30/11 1330  . PACLitaxel (TAXOL) 90 mg in dextrose 5 % 250 mL chemo infusion (</= 80mg /m2)  45 mg/m2 (Treatment Plan Actual) Intravenous Once Mohamed K. Mohamed, MD   90 mg at 08/30/11 1406  . DISCONTD: 0.9 %  sodium chloride infusion   Intravenous Once Mohamed K. Mohamed, MD      . DISCONTD: sodium chloride 0.9 % injection 3 mL  3 mL Intravenous PRN Mohamed K. Arbutus Ped, MD        SURGICAL HISTORY:  Past Surgical History  Procedure Date  . Total abdominal hysterectomy   . Thyroidectomy, partial   . Tonsillectomy   . Tubal ligation     REVIEW OF SYSTEMS:  Pertinent items are noted in HPI. Remainder of the review of systems is negative  PHYSICAL EXAMINATION: General appearance: alert, cooperative and no distress Head: Normocephalic, without obvious abnormality, atraumatic Neck: no adenopathy, no carotid bruit, no JVD, supple, symmetrical, trachea midline and thyroid not enlarged, symmetric, no tenderness/mass/nodules Lymph nodes: Cervical, supraclavicular, and axillary nodes normal. Resp: wheezes Generalized and very faint and Otherwise no crackles or rhonchi are noted Cardio: regular rate and rhythm, S1, S2 normal, no murmur, click, rub or gallop GI: soft, non-tender;  bowel sounds normal; no masses,  no organomegaly Extremities: extremities normal, atraumatic, no cyanosis or edema Of Ball the oropharynx was positive for mild to moderate thrush particularly on the posterior tongue.  ECOG PERFORMANCE STATUS: 1 - Symptomatic but completely ambulatory  Blood pressure 114/67, pulse 115, temperature 98.4 F (36.9 C), temperature source Oral, height 5\' 8"  (1.727 m), weight 189 lb 11.2 oz (86.047 kg).  LABORATORY DATA: Lab Results  Component Value Date   WBC 6.9 07/26/2011   HGB 12.5 08/30/2011   HCT 36.5 08/30/2011   MCV 86.3 08/30/2011   PLT 161 08/30/2011      Chemistry  Component Value Date/Time   NA 136 08/30/2011 1036   NA 140 07/01/2011 0909   K 4.4 08/30/2011 1036   K 4.1 07/01/2011 0909   CL 100 08/30/2011 1036   CL 102 07/01/2011 0909   CO2 24 08/30/2011 1036   CO2 25 07/01/2011 0909   BUN 8 08/30/2011 1036   BUN 14 07/01/2011 0909   CREATININE 0.64 08/30/2011 1036   CREATININE 0.7 07/01/2011 0909      Component Value Date/Time   CALCIUM 8.9 08/30/2011 1036   CALCIUM 8.8 07/01/2011 0909   ALKPHOS 63 08/30/2011 1036   ALKPHOS 50 07/01/2011 0909   AST 16 08/30/2011 1036   AST 31 07/01/2011 0909   ALT 26 08/30/2011 1036   BILITOT 0.2* 08/30/2011 1036   BILITOT 0.40 07/01/2011 0909       RADIOGRAPHIC STUDIES:  No results found.  ASSESSMENT/PLAN: This is very pleasant 55 year old white female with recurrent non-small cell lung cancer squamous cell carcinoma involving the right peritracheal lymphadenopathy. The patient was discussed with Dr. Gwenyth Bouillon. She will continue her course of concurrent chemoradiation as scheduled. She will followup in 3 weeks with a repeat CBC differential and CMET. To address the oral candidiasis, she is given a prescription for Magic mouthwash and for Diflucan. She is also given a Ball given permission for her to receive a massage.   Conni Slipper, PA-C     All questions were answered. The patient  knows to call the clinic with any problems, questions or concerns. We can certainly see the patient much sooner if necessary.

## 2011-08-30 NOTE — Progress Notes (Signed)
1545-" Did I get benadryl. My hands are itching and I can't catch my breath" . Carboplatin finished approximately 1540-Normal saline infusing . Solumedrol 125 mg given IVP . VSS)  O2 sat  95%. Wheezing ascultated ant /posterior lung fields.  Patient observed and Janice Ball in to see patient.  1550 VSS- patient reports she feels a little better except for itching. o2 sat 95%. 1600- patient laughing  O2 sat 94% 1615- feels better hands not itching. 1635- eating ice cream no complaints , feels better. 1645- no wheezing auscultated .

## 2011-08-30 NOTE — Telephone Encounter (Signed)
gv pt appt schedule for nov/dec. °

## 2011-08-31 ENCOUNTER — Ambulatory Visit
Admission: RE | Admit: 2011-08-31 | Discharge: 2011-08-31 | Disposition: A | Discharge status: Home / Self Care | Payer: Self-pay | Source: Ambulatory Visit | Attending: Radiation Oncology | Admitting: Radiation Oncology

## 2011-09-01 ENCOUNTER — Ambulatory Visit
Admission: RE | Admit: 2011-09-01 | Discharge: 2011-09-01 | Disposition: A | Discharge status: Home / Self Care | Payer: Self-pay | Source: Ambulatory Visit | Attending: Radiation Oncology | Admitting: Radiation Oncology

## 2011-09-02 ENCOUNTER — Ambulatory Visit
Admission: RE | Admit: 2011-09-02 | Discharge: 2011-09-02 | Disposition: A | Discharge status: Home / Self Care | Payer: Self-pay | Source: Ambulatory Visit | Attending: Radiation Oncology | Admitting: Radiation Oncology

## 2011-09-02 ENCOUNTER — Encounter: Payer: Self-pay | Admitting: Radiation Oncology

## 2011-09-02 DIAGNOSIS — C3490 Malignant neoplasm of unspecified part of unspecified bronchus or lung: Secondary | ICD-10-CM

## 2011-09-02 NOTE — Progress Notes (Signed)
Weekly Rad2iation Therapy Management  Current Dose: 28 Gy     Planned Dose:  60 Gy  Narrative . . . . . . . .The patient presents for routine under treatment assessment.   MV-CTs were reviewed.  The chart was checked.  Mild productive cough.  No esophagitis. Physical Findings. . Weight essentially stable.  No significant changes. Impression . . . . . . . The patient is  tolerating radiation. Plan . . . . . . . . . .  Continue treatment as planned.

## 2011-09-02 NOTE — Progress Notes (Signed)
Pt had allergic reaction during Carbo chemo Monday. Feet/hands swelled, diff breathing. Given Solumedrol. Pt states no problems since then.  Prod cough w/green mucus x 1 day. Taking Mucinex. Temp 96.8 this morning. Pt states she's calling Dr Bonney Aid today re: cold sypmtoms. No SOB reported, does c/o "heartburn". Gaviscon prn w/some relief.  On Diflucan for thrush x 1 day. Appetite good.

## 2011-09-03 ENCOUNTER — Ambulatory Visit
Admission: RE | Admit: 2011-09-03 | Discharge: 2011-09-03 | Disposition: A | Discharge status: Home / Self Care | Payer: Self-pay | Source: Ambulatory Visit | Attending: Radiation Oncology | Admitting: Radiation Oncology

## 2011-09-04 ENCOUNTER — Ambulatory Visit
Admission: RE | Admit: 2011-09-04 | Discharge: 2011-09-04 | Disposition: A | Discharge status: Home / Self Care | Payer: Self-pay | Source: Ambulatory Visit | Attending: Radiation Oncology | Admitting: Radiation Oncology

## 2011-09-06 ENCOUNTER — Ambulatory Visit
Admission: RE | Admit: 2011-09-06 | Discharge: 2011-09-06 | Disposition: A | Discharge status: Home / Self Care | Payer: Self-pay | Source: Ambulatory Visit | Attending: Radiation Oncology | Admitting: Radiation Oncology

## 2011-09-06 ENCOUNTER — Other Ambulatory Visit (HOSPITAL_BASED_OUTPATIENT_CLINIC_OR_DEPARTMENT_OTHER): Payer: Self-pay | Admitting: Lab

## 2011-09-06 ENCOUNTER — Other Ambulatory Visit: Payer: Self-pay | Admitting: Internal Medicine

## 2011-09-06 ENCOUNTER — Ambulatory Visit (HOSPITAL_BASED_OUTPATIENT_CLINIC_OR_DEPARTMENT_OTHER): Payer: Self-pay

## 2011-09-06 VITALS — BP 121/67 | HR 96 | Temp 97.2°F

## 2011-09-06 DIAGNOSIS — Z5111 Encounter for antineoplastic chemotherapy: Secondary | ICD-10-CM

## 2011-09-06 DIAGNOSIS — C3410 Malignant neoplasm of upper lobe, unspecified bronchus or lung: Secondary | ICD-10-CM

## 2011-09-06 DIAGNOSIS — C3490 Malignant neoplasm of unspecified part of unspecified bronchus or lung: Secondary | ICD-10-CM

## 2011-09-06 LAB — CBC WITH DIFFERENTIAL/PLATELET
BASO%: 0.4 % (ref 0.0–2.0)
Basophils Absolute: 0 10*3/uL (ref 0.0–0.1)
EOS%: 2.1 % (ref 0.0–7.0)
HCT: 36.9 % (ref 34.8–46.6)
HGB: 12.5 g/dL (ref 11.6–15.9)
MCH: 29.6 pg (ref 25.1–34.0)
MCHC: 33.9 g/dL (ref 31.5–36.0)
MCV: 87.4 fL (ref 79.5–101.0)
MONO%: 7.6 % (ref 0.0–14.0)
NEUT%: 70.1 % (ref 38.4–76.8)
RDW: 14.1 % (ref 11.2–14.5)

## 2011-09-06 LAB — COMPREHENSIVE METABOLIC PANEL
AST: 15 U/L (ref 0–37)
Alkaline Phosphatase: 62 U/L (ref 39–117)
BUN: 16 mg/dL (ref 6–23)
Glucose, Bld: 297 mg/dL — ABNORMAL HIGH (ref 70–99)
Total Bilirubin: 0.2 mg/dL — ABNORMAL LOW (ref 0.3–1.2)

## 2011-09-06 MED ORDER — DEXAMETHASONE SODIUM PHOSPHATE 4 MG/ML IJ SOLN
20.0000 mg | Freq: Once | INTRAMUSCULAR | Status: AC
  Administered 2011-09-06: 20 mg via INTRAVENOUS

## 2011-09-06 MED ORDER — SODIUM CHLORIDE 0.9 % IV SOLN
270.0000 mg | Freq: Once | INTRAVENOUS | Status: AC
  Administered 2011-09-06: 270 mg via INTRAVENOUS
  Filled 2011-09-06: qty 27

## 2011-09-06 MED ORDER — ONDANSETRON 16 MG/50ML IVPB (CHCC)
16.0000 mg | Freq: Once | INTRAVENOUS | Status: AC
  Administered 2011-09-06: 16 mg via INTRAVENOUS

## 2011-09-06 MED ORDER — SODIUM CHLORIDE 0.9 % IJ SOLN
100.0000 ug | Freq: Once | INTRAVENOUS | Status: AC
  Administered 2011-09-06: 0.1 mg via INTRADERMAL
  Filled 2011-09-06: qty 0.01

## 2011-09-06 MED ORDER — FAMOTIDINE IN NACL 20-0.9 MG/50ML-% IV SOLN
20.0000 mg | Freq: Once | INTRAVENOUS | Status: AC
  Administered 2011-09-06: 20 mg via INTRAVENOUS

## 2011-09-06 MED ORDER — DIPHENHYDRAMINE HCL 50 MG/ML IJ SOLN
50.0000 mg | Freq: Once | INTRAMUSCULAR | Status: AC
  Administered 2011-09-06: 50 mg via INTRAVENOUS

## 2011-09-06 MED ORDER — PACLITAXEL CHEMO INJECTION 300 MG/50ML
45.0000 mg/m2 | Freq: Once | INTRAVENOUS | Status: AC
  Administered 2011-09-06: 90 mg via INTRAVENOUS
  Filled 2011-09-06: qty 15

## 2011-09-06 MED ORDER — SODIUM CHLORIDE 0.9 % IV SOLN
Freq: Once | INTRAVENOUS | Status: AC
  Administered 2011-09-06: 12:00:00 via INTRAVENOUS

## 2011-09-06 NOTE — Progress Notes (Signed)
Carbo test dose pre and post vital signs stable; no signs of redness or swelling at injection site.

## 2011-09-06 NOTE — Patient Instructions (Signed)
Patient discharged home with no complaints; patient aware of next appointment and has medication for pain and nausea at home.

## 2011-09-07 ENCOUNTER — Ambulatory Visit
Admission: RE | Admit: 2011-09-07 | Discharge: 2011-09-07 | Disposition: A | Discharge status: Home / Self Care | Payer: Self-pay | Source: Ambulatory Visit | Attending: Radiation Oncology | Admitting: Radiation Oncology

## 2011-09-08 ENCOUNTER — Ambulatory Visit
Admission: RE | Admit: 2011-09-08 | Discharge: 2011-09-08 | Disposition: A | Discharge status: Home / Self Care | Payer: Self-pay | Source: Ambulatory Visit | Attending: Radiation Oncology | Admitting: Radiation Oncology

## 2011-09-08 DIAGNOSIS — C3490 Malignant neoplasm of unspecified part of unspecified bronchus or lung: Secondary | ICD-10-CM

## 2011-09-08 MED ORDER — RADIAPLEXRX EX GEL: Freq: Once | CUTANEOUS | Status: DC

## 2011-09-08 NOTE — Progress Notes (Signed)
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  NARRATIVE:  Janice Ball is seen today for weekly assessment.  She has completed 3800 cGy of a planned 6000 cGy as retreatment directed at the right chest area (9 out of 30 planned treatments).  The patient does not have any significant odynophagia or dysphagia.  She has had problems with a cough over the past 5 days.  This has become productive of green sputum and is keeping the patient awake at night.  The patient does have a prior history of frequent episodes of bronchitis.  PHYSICAL EXAMINATION:  Vital Signs:  The patient's weight is 189, which is down a pound since her weigh-in last week.  Lungs:  Examination of lungs reveals them to be clear.  Heart:  Has a regular rhythm and rate. HEENT:  Examination of the oral cavity reveals no secondary infection or mucosal lesion.  IMPRESSION AND PLAN:  The patient is tolerating her treatments well at this time.  The patient's radiation fields are setting up accurately. The patient's radiation chart was checked today.  Given the patient's cough which is now productive of green sputum, I am concerned she may have recurrent bronchitis.  The patient will be placed on Levaquin or Cipro depending on her insurance issues.  I have given the patient a prescription for both of these medications.  In addition, the patient has been given a prescription for Vicodin.  She cannot take codeine, as this causes hallucinations.  The patient also is intolerant of penicillins and sulfa medications.  I told Janice Ball that my preference would be that she start on Levaquin if she can afford this medication. Plan is to continue with chest radiation therapy.  I have asked the patient to let us know if her coughing does not improve with the above regimen.    ______________________________ Billie Lade, Ph.D., M.D. JDK/MEDQ  D:  09/08/2011  T:  09/08/2011  Job:  [id number]

## 2011-09-08 NOTE — Progress Notes (Signed)
Fatigue, prod cough, taking Mucinex, light green mucus.  Skin dark pink in tx area, using Radiaplex. Good appetite, no esophagitis.

## 2011-09-13 ENCOUNTER — Other Ambulatory Visit (HOSPITAL_BASED_OUTPATIENT_CLINIC_OR_DEPARTMENT_OTHER): Payer: Self-pay | Admitting: Lab

## 2011-09-13 ENCOUNTER — Ambulatory Visit
Admission: RE | Admit: 2011-09-13 | Discharge: 2011-09-13 | Disposition: A | Discharge status: Home / Self Care | Payer: Self-pay | Source: Ambulatory Visit | Attending: Radiation Oncology | Admitting: Radiation Oncology

## 2011-09-13 ENCOUNTER — Ambulatory Visit (HOSPITAL_BASED_OUTPATIENT_CLINIC_OR_DEPARTMENT_OTHER): Payer: Self-pay

## 2011-09-13 ENCOUNTER — Other Ambulatory Visit: Payer: Self-pay | Admitting: Internal Medicine

## 2011-09-13 VITALS — BP 122/77 | HR 107 | Temp 97.0°F

## 2011-09-13 DIAGNOSIS — C3490 Malignant neoplasm of unspecified part of unspecified bronchus or lung: Secondary | ICD-10-CM

## 2011-09-13 DIAGNOSIS — C3410 Malignant neoplasm of upper lobe, unspecified bronchus or lung: Secondary | ICD-10-CM

## 2011-09-13 DIAGNOSIS — Z5111 Encounter for antineoplastic chemotherapy: Secondary | ICD-10-CM

## 2011-09-13 LAB — CBC WITH DIFFERENTIAL/PLATELET
BASO%: 0.9 % (ref 0.0–2.0)
LYMPH%: 20 % (ref 14.0–49.7)
MCHC: 34.4 g/dL (ref 31.5–36.0)
MONO#: 0.3 10*3/uL (ref 0.1–0.9)
Platelets: 155 10*3/uL (ref 145–400)
RBC: 4.45 10*6/uL (ref 3.70–5.45)
WBC: 5.7 10*3/uL (ref 3.9–10.3)
nRBC: 0 % (ref 0–0)

## 2011-09-13 LAB — COMPREHENSIVE METABOLIC PANEL
ALT: 21 U/L (ref 0–35)
AST: 19 U/L (ref 0–37)
Creatinine, Ser: 0.65 mg/dL (ref 0.50–1.10)
Sodium: 137 mEq/L (ref 135–145)
Total Bilirubin: 0.3 mg/dL (ref 0.3–1.2)

## 2011-09-13 MED ORDER — SODIUM CHLORIDE 0.9 % IV SOLN
Freq: Once | INTRAVENOUS | Status: AC
  Administered 2011-09-13: 11:00:00 via INTRAVENOUS

## 2011-09-13 MED ORDER — SODIUM CHLORIDE 0.9 % IV SOLN
257.4000 mg | Freq: Once | INTRAVENOUS | Status: AC
  Administered 2011-09-13: 260 mg via INTRAVENOUS
  Filled 2011-09-13: qty 26

## 2011-09-13 MED ORDER — HEPARIN SOD (PORK) LOCK FLUSH 100 UNIT/ML IV SOLN
500.0000 [IU] | Freq: Once | INTRAVENOUS | Status: DC | PRN
  Filled 2011-09-13: qty 5

## 2011-09-13 MED ORDER — SODIUM CHLORIDE 0.9 % IJ SOLN
100.0000 ug | Freq: Once | INTRAVENOUS | Status: AC
  Administered 2011-09-13: 0.1 mg via INTRADERMAL
  Filled 2011-09-13: qty 0.01

## 2011-09-13 MED ORDER — DIPHENHYDRAMINE HCL 50 MG/ML IJ SOLN
50.0000 mg | Freq: Once | INTRAMUSCULAR | Status: AC
  Administered 2011-09-13: 50 mg via INTRAVENOUS

## 2011-09-13 MED ORDER — SODIUM CHLORIDE 0.9 % IJ SOLN
10.0000 mL | INTRAMUSCULAR | Status: DC | PRN
  Filled 2011-09-13: qty 10

## 2011-09-13 MED ORDER — ALTEPLASE 2 MG IJ SOLR
2.0000 mg | Freq: Once | INTRAMUSCULAR | Status: DC | PRN
  Filled 2011-09-13: qty 2

## 2011-09-13 MED ORDER — HEPARIN SOD (PORK) LOCK FLUSH 100 UNIT/ML IV SOLN
250.0000 [IU] | Freq: Once | INTRAVENOUS | Status: DC | PRN
  Filled 2011-09-13: qty 5

## 2011-09-13 MED ORDER — FAMOTIDINE IN NACL 20-0.9 MG/50ML-% IV SOLN
20.0000 mg | Freq: Once | INTRAVENOUS | Status: AC
  Administered 2011-09-13: 20 mg via INTRAVENOUS

## 2011-09-13 MED ORDER — ONDANSETRON 16 MG/50ML IVPB (CHCC)
16.0000 mg | Freq: Once | INTRAVENOUS | Status: AC
  Administered 2011-09-13: 16 mg via INTRAVENOUS

## 2011-09-13 MED ORDER — PACLITAXEL CHEMO INJECTION 300 MG/50ML
45.0000 mg/m2 | Freq: Once | INTRAVENOUS | Status: AC
  Administered 2011-09-13: 90 mg via INTRAVENOUS
  Filled 2011-09-13: qty 15

## 2011-09-13 MED ORDER — SODIUM CHLORIDE 0.9 % IJ SOLN
3.0000 mL | INTRAMUSCULAR | Status: DC | PRN
  Filled 2011-09-13: qty 10

## 2011-09-13 MED ORDER — DEXAMETHASONE SODIUM PHOSPHATE 4 MG/ML IJ SOLN
20.0000 mg | Freq: Once | INTRAMUSCULAR | Status: AC
  Administered 2011-09-13: 20 mg via INTRAVENOUS

## 2011-09-13 NOTE — Progress Notes (Signed)
At 1145,, carboplatin test dose given on pt's  Right FA, site marked with  Maker. The following  Results where noted at the flowing times: 1150 is negative 1145 is negative 1215 is negative.

## 2011-09-14 ENCOUNTER — Ambulatory Visit
Admission: RE | Admit: 2011-09-14 | Discharge: 2011-09-14 | Disposition: A | Discharge status: Home / Self Care | Payer: Self-pay | Source: Ambulatory Visit | Attending: Radiation Oncology | Admitting: Radiation Oncology

## 2011-09-15 ENCOUNTER — Ambulatory Visit
Admission: RE | Admit: 2011-09-15 | Discharge: 2011-09-15 | Disposition: A | Discharge status: Home / Self Care | Payer: Self-pay | Source: Ambulatory Visit | Attending: Radiation Oncology | Admitting: Radiation Oncology

## 2011-09-16 ENCOUNTER — Ambulatory Visit
Admission: RE | Admit: 2011-09-16 | Discharge: 2011-09-16 | Disposition: A | Discharge status: Home / Self Care | Payer: Self-pay | Source: Ambulatory Visit | Attending: Radiation Oncology | Admitting: Radiation Oncology

## 2011-09-16 VITALS — Wt 189.9 lb

## 2011-09-16 DIAGNOSIS — C3490 Malignant neoplasm of unspecified part of unspecified bronchus or lung: Secondary | ICD-10-CM

## 2011-09-16 NOTE — Progress Notes (Signed)
[facility] Radiation Oncology Weekly Treatment Note    Name: Janice Ball Date: 09/16/2011 MRN: [id number] DOB: [birth year]  Status:outpatient    Current dose: 4600  Current fraction:23  Planned dose:6000  Planned fraction:30   MEDICATIONS: Current Outpatient Prescriptions  Medication Sig Dispense Refill  . albuterol (VENTOLIN HFA) 108 (90 BASE) MCG/ACT inhaler Inhale 2 puffs into the lungs every 6 (six) hours as needed.        . Alum & Mag Hydroxide-Simeth (MAGIC MOUTHWASH) SOLN Take 5 mLs by mouth 4 (four) times daily.  240 mL  0  . aspirin 325 MG tablet Take 325 mg by mouth daily.        . budesonide-formoterol (SYMBICORT) 160-4.5 MCG/ACT inhaler Inhale 2 puffs into the lungs 2 (two) times daily.  1 Inhaler  1  . Chromium 1000 MCG TABS Take 1 tablet by mouth daily.        . ciprofloxacin (CIPRO) 500 MG tablet Take 500 mg by mouth 2 (two) times daily. depends on cost,pt  will take this or levaquin  Both written by MD       . diclofenac-misoprostol (ARTHROTEC 75) 75-0.2 MG per tablet Take 1 tablet by mouth 2 (two) times daily.        . fenofibrate (TRICOR) 145 MG tablet Take 145 mg by mouth daily.        . Guaifenesin (MUCINEX MAXIMUM STRENGTH) 1200 MG TB12 Take 1 tablet by mouth every 12 (twelve) hours as needed.        Marland Kitchen HYDROcodone-acetaminophen (NORCO) 5-325 MG per tablet Take 1 tablet by mouth every 6 (six) hours as needed. Written as vicodin 5mg  1 tab q 4-6hprn cough or pain po,dispence 25, no refill       . levothyroxine (SYNTHROID, LEVOTHROID) 125 MCG tablet Take 125 mcg by mouth daily.        Marland Kitchen LORazepam (ATIVAN) 1 MG tablet Take 1 mg by mouth every 6 (six) hours as needed.        . magnesium gluconate (MAGONATE) 500 MG tablet Take 1,000 mg by mouth daily.       . metFORMIN (GLUCOPHAGE) 500 MG tablet Take 500 mg by mouth 2 (two) times daily with a meal.        . montelukast (SINGULAIR) 10 MG tablet Take 10 mg by mouth at bedtime.        . Multiple  Vitamins-Minerals (CENTRUM SILVER PO) Take 1 tablet by mouth daily.        . niacin (NIASPAN) 1000 MG CR tablet Take 1,000 mg by mouth at bedtime.        . sertraline (ZOLOFT) 100 MG tablet Take 100 mg by mouth daily. Takes 1 1/2 tabs daily for total dose of 150 mg      . spironolactone (ALDACTONE) 25 MG tablet Take 25 mg by mouth daily.        . travoprost, benzalkonium, (TRAVATAN) 0.004 % ophthalmic solution Place 1 drop into both eyes at bedtime.        . vitamin B-12 (CYANOCOBALAMIN) 500 MCG tablet Take 500 mcg by mouth daily.        . fluconazole (DIFLUCAN) 100 MG tablet Take 1 tablet (100 mg total) by mouth daily. Take 2 stat, then 1 po daily until completed  16 tablet  0  . levofloxacin (LEVAQUIN) 500 MG tablet Take 500 mg by mouth daily. Depends on cost of med, either this or cipro written byMD  ALLERGIES: Sulfonamide derivatives; Codeine; and Penicillins   LABORATORY DATA:  Lab Results  Component Value Date   WBC 5.7 09/13/2011   HGB 13.1 09/13/2011   HCT 38.1 09/13/2011   MCV 85.6 09/13/2011   PLT 155 09/13/2011   Lab Results  Component Value Date   NA 137 09/13/2011   K 4.4 09/13/2011   CL 104 09/13/2011   CO2 21 09/13/2011   Lab Results  Component Value Date   ALT 21 09/13/2011   AST 19 09/13/2011   ALKPHOS 54 09/13/2011   BILITOT 0.3 09/13/2011      NARRATIVE: Janice Ball was seen today for weekly treatment management. The chart was checked and MVCT images were reviewed. Pt doing well. Cold improved. One more day on cipro. No other complaints.  PHYSICAL EXAMINATION: weight is 189 lb 14.4 oz (86.138 kg).        ASSESSMENT: Patient tolerating treatments well.    PLAN: Continue treatment as planned.

## 2011-09-16 NOTE — Progress Notes (Signed)
Pt has one more day of Cipro, Diflucan is completed. Cold symptoms improving.

## 2011-09-17 ENCOUNTER — Ambulatory Visit
Admission: RE | Admit: 2011-09-17 | Discharge: 2011-09-17 | Disposition: A | Discharge status: Home / Self Care | Payer: Self-pay | Source: Ambulatory Visit | Attending: Radiation Oncology | Admitting: Radiation Oncology

## 2011-09-20 ENCOUNTER — Ambulatory Visit (HOSPITAL_BASED_OUTPATIENT_CLINIC_OR_DEPARTMENT_OTHER): Payer: Self-pay | Admitting: Lab

## 2011-09-20 ENCOUNTER — Ambulatory Visit
Admission: RE | Admit: 2011-09-20 | Discharge: 2011-09-20 | Disposition: A | Discharge status: Home / Self Care | Payer: Self-pay | Source: Ambulatory Visit | Attending: Radiation Oncology | Admitting: Radiation Oncology

## 2011-09-20 ENCOUNTER — Other Ambulatory Visit: Payer: Self-pay | Admitting: Internal Medicine

## 2011-09-20 ENCOUNTER — Encounter: Payer: Self-pay | Admitting: Physician Assistant

## 2011-09-20 ENCOUNTER — Ambulatory Visit (HOSPITAL_BASED_OUTPATIENT_CLINIC_OR_DEPARTMENT_OTHER): Payer: Self-pay | Admitting: Physician Assistant

## 2011-09-20 ENCOUNTER — Telehealth: Payer: Self-pay | Admitting: Internal Medicine

## 2011-09-20 ENCOUNTER — Ambulatory Visit (HOSPITAL_BASED_OUTPATIENT_CLINIC_OR_DEPARTMENT_OTHER): Payer: Self-pay

## 2011-09-20 VITALS — BP 118/66 | HR 100 | Temp 98.2°F

## 2011-09-20 DIAGNOSIS — C3490 Malignant neoplasm of unspecified part of unspecified bronchus or lung: Secondary | ICD-10-CM

## 2011-09-20 DIAGNOSIS — R05 Cough: Secondary | ICD-10-CM

## 2011-09-20 DIAGNOSIS — R059 Cough, unspecified: Secondary | ICD-10-CM

## 2011-09-20 DIAGNOSIS — Z5111 Encounter for antineoplastic chemotherapy: Secondary | ICD-10-CM

## 2011-09-20 DIAGNOSIS — C3410 Malignant neoplasm of upper lobe, unspecified bronchus or lung: Secondary | ICD-10-CM

## 2011-09-20 DIAGNOSIS — F17200 Nicotine dependence, unspecified, uncomplicated: Secondary | ICD-10-CM

## 2011-09-20 LAB — CBC WITH DIFFERENTIAL/PLATELET
BASO%: 0.8 % (ref 0.0–2.0)
Basophils Absolute: 0 10*3/uL (ref 0.0–0.1)
EOS%: 1.9 % (ref 0.0–7.0)
HCT: 37.6 % (ref 34.8–46.6)
HGB: 13.1 g/dL (ref 11.6–15.9)
LYMPH%: 22.1 % (ref 14.0–49.7)
MCH: 30.3 pg (ref 25.1–34.0)
MCHC: 34.8 g/dL (ref 31.5–36.0)
MCV: 86.8 fL (ref 79.5–101.0)
MONO%: 5.8 % (ref 0.0–14.0)
NEUT%: 69.4 % (ref 38.4–76.8)
Platelets: 171 10*3/uL (ref 145–400)
lymph#: 1.2 10*3/uL (ref 0.9–3.3)

## 2011-09-20 MED ORDER — DIPHENHYDRAMINE HCL 50 MG/ML IJ SOLN
50.0000 mg | Freq: Once | INTRAMUSCULAR | Status: AC
  Administered 2011-09-20: 50 mg via INTRAVENOUS

## 2011-09-20 MED ORDER — PACLITAXEL CHEMO INJECTION 300 MG/50ML
45.0000 mg/m2 | Freq: Once | INTRAVENOUS | Status: AC
  Administered 2011-09-20: 90 mg via INTRAVENOUS
  Filled 2011-09-20: qty 15

## 2011-09-20 MED ORDER — FAMOTIDINE IN NACL 20-0.9 MG/50ML-% IV SOLN
20.0000 mg | Freq: Once | INTRAVENOUS | Status: AC
Start: 2011-09-20 — End: 2011-09-20
  Administered 2011-09-20: 20 mg via INTRAVENOUS

## 2011-09-20 MED ORDER — SODIUM CHLORIDE 0.9 % IV SOLN
270.0000 mg | Freq: Once | INTRAVENOUS | Status: AC
  Administered 2011-09-20: 270 mg via INTRAVENOUS
  Filled 2011-09-20: qty 27

## 2011-09-20 MED ORDER — ONDANSETRON 16 MG/50ML IVPB (CHCC)
16.0000 mg | Freq: Once | INTRAVENOUS | Status: AC
  Administered 2011-09-20: 16 mg via INTRAVENOUS

## 2011-09-20 MED ORDER — DEXAMETHASONE SODIUM PHOSPHATE 4 MG/ML IJ SOLN
20.0000 mg | Freq: Once | INTRAMUSCULAR | Status: AC
  Administered 2011-09-20: 20 mg via INTRAVENOUS

## 2011-09-20 MED ORDER — SODIUM CHLORIDE 0.9 % IV SOLN
Freq: Once | INTRAVENOUS | Status: AC
  Administered 2011-09-20: 15:00:00 via INTRAVENOUS

## 2011-09-20 MED ORDER — SODIUM CHLORIDE 0.9 % IJ SOLN
100.0000 ug | Freq: Once | INTRAVENOUS | Status: AC
  Administered 2011-09-20: 0.1 mg via INTRADERMAL
  Filled 2011-09-20: qty 0.01

## 2011-09-20 MED ORDER — INFLUENZA VIRUS VACC SPLIT PF IM SUSP
0.5000 mL | INTRAMUSCULAR | Status: AC
  Administered 2011-09-20: 0.5 mL via INTRAMUSCULAR
  Filled 2011-09-20: qty 0.5

## 2011-09-20 NOTE — Telephone Encounter (Signed)
gve the pt her jan 2013 appt calendar along with the ct scan appt °

## 2011-09-20 NOTE — Progress Notes (Signed)
No images are attached to the encounter. No scans are attached to the encounter. No scans are attached to the encounter. [facility] OFFICE PROGRESS NOTE  No primary provider on file. No primary provider on file.  DIAGNOSIS: Recurrent non-small cell lung cancer, squamous cell carcinoma initially diagnosis stage IIIa (T1 N2 MX) in August of 2010  PRIOR THERAPY: 1. status post concurrent chemoradiation with weekly carboplatin and paclitaxel, last dose of chemotherapy was given 08/04/2009 2. Status post consolidation chemotherapy with carboplatin paclitaxel last dose given 10/27/2009  CURRENT THERAPY:  INTERVAL HISTORY: Janice Ball 55 y.o. female returns for a regular office visit for followup of her recurrent non-small cell lung cancer, squamous cell carcinoma. She reports a recurrent cough that has been productive of anywhere from  clear to green secretions. She completed a course of Cipro on Friday that was prescribed by Dr. Roselind Messier for her productive cough. She denies any fevers but reports some headaches not associated with blurred or double vision. She requests a flu shot. She continues to smoke approximately 5 cigarettes daily.  MEDICAL HISTORY: Past Medical History  Diagnosis Date  . Diabetes mellitus   . Anxiety   . Hyperlipidemia   . Hypothyroidism   . COPD (chronic obstructive pulmonary disease)   . Lung cancer     lung ca dx 8/10    ALLERGIES:  is allergic to sulfonamide derivatives; codeine; and penicillins.  MEDICATIONS:  Current Outpatient Prescriptions  Medication Sig Dispense Refill  . albuterol (VENTOLIN HFA) 108 (90 BASE) MCG/ACT inhaler Inhale 2 puffs into the lungs every 6 (six) hours as needed.        . Alum & Mag Hydroxide-Simeth (MAGIC MOUTHWASH) SOLN Take 5 mLs by mouth 4 (four) times daily.  240 mL  0  . aspirin 325 MG tablet Take 325 mg by mouth daily.        . budesonide-formoterol (SYMBICORT) 160-4.5 MCG/ACT inhaler Inhale 2 puffs into  the lungs 2 (two) times daily.  1 Inhaler  1  . Chromium 1000 MCG TABS Take 1 tablet by mouth daily.        . diclofenac-misoprostol (ARTHROTEC 75) 75-0.2 MG per tablet Take 1 tablet by mouth 2 (two) times daily.        . fenofibrate (TRICOR) 145 MG tablet Take 145 mg by mouth daily.        . fluconazole (DIFLUCAN) 100 MG tablet Take 1 tablet (100 mg total) by mouth daily. Take 2 stat, then 1 po daily until completed  16 tablet  0  . Guaifenesin (MUCINEX MAXIMUM STRENGTH) 1200 MG TB12 Take 1 tablet by mouth every 12 (twelve) hours as needed.        Marland Kitchen HYDROcodone-acetaminophen (NORCO) 5-325 MG per tablet Take 1 tablet by mouth every 6 (six) hours as needed. Written as vicodin 5mg  1 tab q 4-6hprn cough or pain po,dispence 25, no refill       . levothyroxine (SYNTHROID, LEVOTHROID) 125 MCG tablet Take 125 mcg by mouth daily.        Marland Kitchen LORazepam (ATIVAN) 1 MG tablet Take 1 mg by mouth every 6 (six) hours as needed.        . magnesium gluconate (MAGONATE) 500 MG tablet Take 1,000 mg by mouth daily.       . metFORMIN (GLUCOPHAGE) 500 MG tablet Take 500 mg by mouth 2 (two) times daily with a meal.        . montelukast (SINGULAIR) 10 MG tablet Take 10  mg by mouth at bedtime.        . Multiple Vitamins-Minerals (CENTRUM SILVER PO) Take 1 tablet by mouth daily.        . niacin (NIASPAN) 1000 MG CR tablet Take 1,000 mg by mouth at bedtime.        . sertraline (ZOLOFT) 100 MG tablet Take 100 mg by mouth daily. Takes 1 1/2 tabs daily for total dose of 150 mg      . spironolactone (ALDACTONE) 25 MG tablet Take 25 mg by mouth daily.        . travoprost, benzalkonium, (TRAVATAN) 0.004 % ophthalmic solution Place 1 drop into both eyes at bedtime.        . vitamin B-12 (CYANOCOBALAMIN) 500 MCG tablet Take 500 mcg by mouth daily.         Current Facility-Administered Medications  Medication Dose Route Frequency Provider Last Rate Last Dose  . influenza  inactive virus vaccine (FLUZONE/FLUARIX) injection 0.5 mL  0.5  mL Intramuscular Tomorrow-1000 Conni Slipper, PA       Facility-Administered Medications Ordered in Other Visits  Medication Dose Route Frequency Provider Last Rate Last Dose  . 0.9 %  sodium chloride infusion   Intravenous Once Mohamed K. Mohamed, MD 20 mL/hr at 09/20/11 1459    . CARBOplatin (PARAPLATIN) 270 mg in sodium chloride 0.9 % 100 mL chemo infusion  270 mg Intravenous Once Mohamed K. Mohamed, MD      . CARBOplatin chemo intradermal Test Dose 100 mcg  100 mcg Intradermal Once Mohamed K. Mohamed, MD   0.1 mg at 09/20/11 1459  . dexamethasone (DECADRON) injection 20 mg  20 mg Intravenous Once Mohamed K. Mohamed, MD   20 mg at 09/20/11 1538  . diphenhydrAMINE (BENADRYL) injection 50 mg  50 mg Intravenous Once Mohamed K. Mohamed, MD   50 mg at 09/20/11 1538  . famotidine (PEPCID) IVPB 20 mg  20 mg Intravenous Once Mohamed K. Mohamed, MD   20 mg at 09/20/11 1555  . ondansetron (ZOFRAN) IVPB 16 mg  16 mg Intravenous Once Mohamed K. Mohamed, MD   16 mg at 09/20/11 1538  . PACLitaxel (TAXOL) 90 mg in dextrose 5 % 250 mL chemo infusion (</= 80mg /m2)  45 mg/m2 (Treatment Plan Actual) Intravenous Once Mohamed K. Mohamed, MD 265 mL/hr at 09/20/11 1608 90 mg at 09/20/11 1608    SURGICAL HISTORY:  Past Surgical History  Procedure Date  . Total abdominal hysterectomy   . Thyroidectomy, partial   . Tonsillectomy   . Tubal ligation     REVIEW OF SYSTEMS:  A comprehensive review of systems was negative except for: Respiratory: positive for cough Neurological: positive for headaches   PHYSICAL EXAMINATION: General appearance: alert, cooperative and no distress Head: Normocephalic, without obvious abnormality, atraumatic Neck: no adenopathy, no carotid bruit, no JVD, supple, symmetrical, trachea midline and thyroid not enlarged, symmetric, no tenderness/mass/nodules Lymph nodes: Cervical, supraclavicular, and axillary nodes normal. Resp: clear to auscultation bilaterally Cardio: regular rate  and rhythm, S1, S2 normal, no murmur, click, rub or gallop GI: soft, non-tender; bowel sounds normal; no masses,  no organomegaly Extremities: extremities normal, atraumatic, no cyanosis or edema  ECOG PERFORMANCE STATUS: 1 - Symptomatic but completely ambulatory  There were no vitals taken for this visit.  LABORATORY DATA: Lab Results  Component Value Date   WBC 5.2 09/20/2011   HGB 13.1 09/20/2011   HCT 37.6 09/20/2011   MCV 86.8 09/20/2011   PLT 171 09/20/2011  Chemistry      Component Value Date/Time   NA 137 09/13/2011 1021   NA 140 07/01/2011 0909   K 4.4 09/13/2011 1021   K 4.1 07/01/2011 0909   CL 104 09/13/2011 1021   CL 102 07/01/2011 0909   CO2 21 09/13/2011 1021   CO2 25 07/01/2011 0909   BUN 11 09/13/2011 1021   BUN 14 07/01/2011 0909   CREATININE 0.65 09/13/2011 1021   CREATININE 0.7 07/01/2011 0909      Component Value Date/Time   CALCIUM 8.7 09/13/2011 1021   CALCIUM 8.8 07/01/2011 0909   ALKPHOS 54 09/13/2011 1021   ALKPHOS 50 07/01/2011 0909   AST 19 09/13/2011 1021   AST 31 07/01/2011 0909   ALT 21 09/13/2011 1021   BILITOT 0.3 09/13/2011 1021   BILITOT 0.40 07/01/2011 0909       RADIOGRAPHIC STUDIES:  No results found.  ASSESSMENT/PLAN:This is a very pleasant 55 year old white female with recurrent non-small cell lung cancer, squamous cell carcinoma involving the right peritracheal lymphadenopathy. She's been treated to date as described above. The patient was discussed with Dr. Arbutus Ped. She will complete her course of concurrent chemoradiation as scheduled. She'll followup with Dr. Arbutus Ped in 4 weeks with a repeat CBC differential C. met and CT of the chest with contrast to reevaluate her disease. She was advised/encouraged to discontinue smoking.     Conni Slipper, PA-C     All questions were answered. The patient knows to call the clinic with any problems, questions or concerns. We can certainly see the patient much sooner if  necessary.

## 2011-09-21 ENCOUNTER — Ambulatory Visit
Admission: RE | Admit: 2011-09-21 | Discharge: 2011-09-21 | Disposition: A | Discharge status: Home / Self Care | Payer: Self-pay | Source: Ambulatory Visit | Attending: Radiation Oncology | Admitting: Radiation Oncology

## 2011-09-21 LAB — COMPREHENSIVE METABOLIC PANEL
AST: 17 U/L (ref 0–37)
Alkaline Phosphatase: 52 U/L (ref 39–117)
BUN: 11 mg/dL (ref 6–23)
Creatinine, Ser: 0.78 mg/dL (ref 0.50–1.10)
Glucose, Bld: 225 mg/dL — ABNORMAL HIGH (ref 70–99)

## 2011-09-22 ENCOUNTER — Ambulatory Visit
Admission: RE | Admit: 2011-09-22 | Discharge: 2011-09-22 | Disposition: A | Discharge status: Home / Self Care | Payer: Self-pay | Source: Ambulatory Visit | Attending: Radiation Oncology | Admitting: Radiation Oncology

## 2011-09-23 ENCOUNTER — Ambulatory Visit
Admission: RE | Admit: 2011-09-23 | Discharge: 2011-09-23 | Disposition: A | Discharge status: Home / Self Care | Payer: Self-pay | Source: Ambulatory Visit | Attending: Radiation Oncology | Admitting: Radiation Oncology

## 2011-09-24 ENCOUNTER — Ambulatory Visit
Admission: RE | Admit: 2011-09-24 | Discharge: 2011-09-24 | Disposition: A | Discharge status: Home / Self Care | Payer: Self-pay | Source: Ambulatory Visit | Attending: Radiation Oncology | Admitting: Radiation Oncology

## 2011-09-24 VITALS — Wt 191.6 lb

## 2011-09-24 DIAGNOSIS — C3490 Malignant neoplasm of unspecified part of unspecified bronchus or lung: Secondary | ICD-10-CM

## 2011-09-24 NOTE — Progress Notes (Signed)
Pt states only side effect is fatigue. Cough "almost gone" from her earlier bronchitis. Completes Monday.

## 2011-09-24 NOTE — Progress Notes (Signed)
  Radiation Oncology         [phone] ________________________________  Name: Janice Ball MRN: [id number]  Date: 09/24/2011  DOB: Mar 24, 1956  Weekly Radiation Therapy Management  Current Dose: 58 Gy     Planned Dose:  60 Gy  Narrative . . . . . . . . The patient presents for routine under treatment assessment.                                                     The patient is without complaint.                                 Set-up films were reviewed.                                 The chart was checked. Physical Findings. . . Weight essentially stable.  No significant changes. Impression . . . . . . . The patient is  tolerating radiation. Plan . . . . . . . . . . . . Continue treatment as planned.  ________________________________  Artist Pais. Kathrynn Running, M.D.

## 2011-09-27 ENCOUNTER — Ambulatory Visit
Admission: RE | Admit: 2011-09-27 | Discharge: 2011-09-27 | Disposition: A | Discharge status: Home / Self Care | Payer: Self-pay | Source: Ambulatory Visit | Attending: Radiation Oncology | Admitting: Radiation Oncology

## 2011-09-28 ENCOUNTER — Ambulatory Visit: Payer: Self-pay

## 2011-10-17 ENCOUNTER — Encounter: Payer: Self-pay | Admitting: Radiation Oncology

## 2011-10-17 NOTE — Progress Notes (Signed)
   Department of Radiation Oncology  Phone:  [phone] Fax:        [phone]  Name:Janice Ball  Date:10/17/2011           GNF:[id number] DOB:11-10-1955   Status:outpatient   CC: Lajuana Matte, M.D.   DIAGNOSIS:  This is a 55 year old woman with recurrent squamous cell carcinoma of the right lung with right paratracheal lymph node involvement.  INDICATION FOR TREATMENT: Palliative  TREATMENT DATES: 08/16/2011 through 09/27/2011                     SITE/DOSE:  60 gray in 30 fractions to the recurrent mediastinal involvement.  It should be noted that the current course of radiation overlaps significantly with her previous radiation 2 years ago. Cumulative radiation planning was performed to account for the overlap region and there are portions of the chest which were treated to 125 gray. The potential risks of this high dose distribution were outweighed by the potential benefit of treatment.           BEAMS/ENERGY:   Patient received IM RT using total therapy delivering 6 megavolt photons to a helical IM RT treatment. She underwent daily megavoltage CT imaging for treatment position prior to each fraction.            NARRATIVE: The patient seems to tolerate radiation treatment relatively well. She experienced minimal esophageal discomfort. She remains stable from a respiratory standpoint.  PLAN: Routine followup in one month. Patient instructed to call if questions or worsening complaints in interim.  ------------------------------------------------  Artist Pais Kathrynn Running, M.D.

## 2011-10-28 ENCOUNTER — Encounter (HOSPITAL_COMMUNITY): Payer: Self-pay

## 2011-10-28 ENCOUNTER — Encounter: Payer: Self-pay | Admitting: *Deleted

## 2011-10-28 ENCOUNTER — Ambulatory Visit
Admission: RE | Admit: 2011-10-28 | Discharge: 2011-10-28 | Disposition: A | Discharge status: Home / Self Care | Payer: Self-pay | Source: Ambulatory Visit | Attending: Radiation Oncology | Admitting: Radiation Oncology

## 2011-10-28 ENCOUNTER — Ambulatory Visit (HOSPITAL_COMMUNITY)
Admission: RE | Admit: 2011-10-28 | Discharge: 2011-10-28 | Disposition: A | Discharge status: Home / Self Care | Payer: Self-pay | Source: Ambulatory Visit | Attending: Physician Assistant | Admitting: Physician Assistant

## 2011-10-28 ENCOUNTER — Encounter: Payer: Self-pay | Admitting: Radiation Oncology

## 2011-10-28 ENCOUNTER — Other Ambulatory Visit: Payer: Self-pay | Admitting: Physician Assistant

## 2011-10-28 ENCOUNTER — Other Ambulatory Visit (HOSPITAL_BASED_OUTPATIENT_CLINIC_OR_DEPARTMENT_OTHER): Payer: Self-pay | Admitting: Lab

## 2011-10-28 DIAGNOSIS — C3490 Malignant neoplasm of unspecified part of unspecified bronchus or lung: Secondary | ICD-10-CM

## 2011-10-28 DIAGNOSIS — Y842 Radiological procedure and radiotherapy as the cause of abnormal reaction of the patient, or of later complication, without mention of misadventure at the time of the procedure: Secondary | ICD-10-CM | POA: Insufficient documentation

## 2011-10-28 DIAGNOSIS — R911 Solitary pulmonary nodule: Secondary | ICD-10-CM | POA: Insufficient documentation

## 2011-10-28 DIAGNOSIS — T66XXXA Radiation sickness, unspecified, initial encounter: Secondary | ICD-10-CM | POA: Insufficient documentation

## 2011-10-28 DIAGNOSIS — R599 Enlarged lymph nodes, unspecified: Secondary | ICD-10-CM | POA: Insufficient documentation

## 2011-10-28 DIAGNOSIS — C3410 Malignant neoplasm of upper lobe, unspecified bronchus or lung: Secondary | ICD-10-CM

## 2011-10-28 DIAGNOSIS — K769 Liver disease, unspecified: Secondary | ICD-10-CM | POA: Insufficient documentation

## 2011-10-28 LAB — CMP (CANCER CENTER ONLY)
BUN, Bld: 11 mg/dL (ref 7–22)
CO2: 27 mEq/L (ref 18–33)
Creat: 0.4 mg/dl — ABNORMAL LOW (ref 0.6–1.2)
Glucose, Bld: 160 mg/dL — ABNORMAL HIGH (ref 73–118)
Total Bilirubin: 0.4 mg/dl (ref 0.20–1.60)

## 2011-10-28 LAB — CBC WITH DIFFERENTIAL/PLATELET
Basophils Absolute: 0 10*3/uL (ref 0.0–0.1)
Eosinophils Absolute: 0.2 10*3/uL (ref 0.0–0.5)
HCT: 36.8 % (ref 34.8–46.6)
LYMPH%: 28.7 % (ref 14.0–49.7)
MCHC: 34.9 g/dL (ref 31.5–36.0)
MONO#: 0.4 10*3/uL (ref 0.1–0.9)
NEUT%: 58.9 % (ref 38.4–76.8)
Platelets: 189 10*3/uL (ref 145–400)
WBC: 5.4 10*3/uL (ref 3.9–10.3)

## 2011-10-28 MED ORDER — IOHEXOL 300 MG/ML  SOLN
80.0000 mL | Freq: Once | INTRAMUSCULAR | Status: AC | PRN
  Administered 2011-10-28: 80 mL via INTRAVENOUS

## 2011-10-28 NOTE — Progress Notes (Signed)
Follow up  Right Lung Cancer Radiation treatments= 06/30/2009-08/15/2009 &  08/16/11-09/27/11   Divorced,2 children   Allergie::Sulfa=rash,Codeine-halluncinations   Pt c/o itching on the inside  Mid sternum,off and on, maily s/p coughing spell, occasionally clear /greyish color sputum Pt has sinus infection now, not on antibuiotics

## 2011-10-28 NOTE — Progress Notes (Signed)
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   Interval Since Last Radiation:  2 months  Narrative . . . . . . . . The patient returns today for routine follow-up.  She had a followup chest CT performed on January 10. This demonstrated an interval decrease in the size of right paratracheal adenopathy. The patient is essentially without complaint. She notes some mild esophageal symptoms. She also does have with some dyspnea.                              ALLERGIES:  is allergic to sulfonamide derivatives; codeine; and penicillins.  Meds. . . . . . . . . . . . Current Outpatient Prescriptions  Medication Sig Dispense Refill  . albuterol (VENTOLIN HFA) 108 (90 BASE) MCG/ACT inhaler Inhale 2 puffs into the lungs every 6 (six) hours as needed.        . Alum & Mag Hydroxide-Simeth (MAGIC MOUTHWASH) SOLN Take 5 mLs by mouth 4 (four) times daily.  240 mL  0  . aspirin 325 MG tablet Take 325 mg by mouth daily.        . budesonide-formoterol (SYMBICORT) 160-4.5 MCG/ACT inhaler Inhale 2 puffs into the lungs 2 (two) times daily.  1 Inhaler  1  . Chromium 1000 MCG TABS Take 1 tablet by mouth daily.        . diclofenac-misoprostol (ARTHROTEC 75) 75-0.2 MG per tablet Take 1 tablet by mouth 2 (two) times daily.        . fenofibrate (TRICOR) 145 MG tablet Take 145 mg by mouth daily.        . fluconazole (DIFLUCAN) 100 MG tablet Take 1 tablet (100 mg total) by mouth daily. Take 2 stat, then 1 po daily until completed  16 tablet  0  . Guaifenesin (MUCINEX MAXIMUM STRENGTH) 1200 MG TB12 Take 1 tablet by mouth every 12 (twelve) hours as needed.        Marland Kitchen HYDROcodone-acetaminophen (NORCO) 5-325 MG per tablet  Take 1 tablet by mouth every 6 (six) hours as needed. Written as vicodin 5mg  1 tab q 4-6hprn cough or pain po,dispence 25, no refill       . levothyroxine (SYNTHROID, LEVOTHROID) 125 MCG tablet Take 125 mcg by mouth daily.        Marland Kitchen LORazepam (ATIVAN) 1 MG tablet Take 1 mg by mouth every 6 (six) hours as needed.        . magnesium gluconate (MAGONATE) 500 MG tablet Take 1,000 mg by mouth daily.       . metFORMIN (GLUCOPHAGE) 500 MG tablet Take 500 mg by mouth 2 (two) times daily with a meal.        . montelukast (SINGULAIR) 10 MG tablet Take 10 mg by mouth at bedtime.        . Multiple Vitamins-Minerals (CENTRUM SILVER PO) Take 1 tablet by mouth daily.        . niacin (NIASPAN) 1000 MG CR tablet Take 1,000 mg by mouth at bedtime.        . sertraline (ZOLOFT) 100 MG tablet Take 100 mg by mouth  daily. Takes 1 1/2 tabs daily for total dose of 150 mg      . spironolactone (ALDACTONE) 25 MG tablet Take 25 mg by mouth daily.        . travoprost, benzalkonium, (TRAVATAN) 0.004 % ophthalmic solution Place 1 drop into both eyes at bedtime.        . vitamin B-12 (CYANOCOBALAMIN) 500 MCG tablet Take 500 mcg by mouth daily.         No current facility-administered medications for this encounter.   Facility-Administered Medications Ordered in Other Encounters  Medication Dose Route Frequency Provider Last Rate Last Dose  . iohexol (OMNIPAQUE) 300 MG/ML solution 80 mL  80 mL Intravenous Once PRN Medication Radiologist   80 mL at 10/28/11 0914    Physical Findings. . .  weight is 190 lb 9.6 oz (86.456 kg). Her oral temperature is 97.9 F (36.6 C). Her blood pressure is 122/72 and her pulse is 104. Her respiration is 20 and oxygen saturation is 96%. .  No significant changes.  Lab Findings . . . . .  Lab Results  Component Value Date   WBC 5.4 10/28/2011   HGB 12.9 10/28/2011   HCT 36.8 10/28/2011   MCV 90.4 10/28/2011   PLT 189 10/28/2011   X-Ray Findings. . . . Ct Chest W Contrast  10/28/2011   *RADIOLOGY REPORT*  Clinical Data: Lung cancer restaging.  CT CHEST WITH CONTRAST  Technique:  Multidetector CT imaging of the chest was performed following the standard protocol during bolus administration of intravenous contrast.  Contrast: 80mL OMNIPAQUE IOHEXOL 300 MG/ML IV SOLN  Comparison: PET CT 07/13/2011 and CT chest 07/01/2011.  Findings: Right thyroid appears surgically absent.  Low right paratracheal lymph node measures 1.3 cm (previously 1.5 cm when remeasured).  No hilar or axillary adenopathy.  Left coronary artery calcification.  Heart size normal.  No pericardial effusion.  Radiation fibrosis and volume loss are seen in the right perihilar region.  Scattered tiny nodular densities in the left lung measure 4 mm or less in size and are unchanged.  No pleural fluid.  A small diverticulum is seen along the right posterolateral aspect of the upper trachea, as before.  Airway is otherwise unremarkable.  Incidental imaging of the upper abdomen shows low attenuation throughout the visualized portion of the liver.  Minimal nodular thickening of the lateral limb right adrenal gland is stable.  No worrisome lytic or sclerotic lesions.  IMPRESSION:  1.  Interval decrease in size of right paratracheal adenopathy. 2.  Radiation fibrosis and volume loss in the right perihilar region, stable. 3.  Scattered tiny pulmonary nodules are unchanged. 4.  Hepatic steatosis.  Original Report Authenticated By: Reyes Ivan, M.D.    Impression . . . . . . . The patient is recovering from the effects of radiation.  Her initial radiographic response appears encouraging with a partial response.  Plan . . . . . . . . . . . Marland Kitchen the patient will return to radiation oncology clinic for routine followup in 6 months. She'll be seeing Dr. Shirline Frees during the next few weeks to discuss the potential for further adjuvant chemotherapy.  _____________________________________  Artist Pais. Kathrynn Running, M.D.

## 2011-10-31 ENCOUNTER — Encounter: Payer: Self-pay | Admitting: Radiation Oncology

## 2011-11-01 ENCOUNTER — Telehealth: Payer: Self-pay | Admitting: Internal Medicine

## 2011-11-01 ENCOUNTER — Ambulatory Visit (HOSPITAL_BASED_OUTPATIENT_CLINIC_OR_DEPARTMENT_OTHER): Payer: Self-pay | Admitting: Internal Medicine

## 2011-11-01 DIAGNOSIS — C3490 Malignant neoplasm of unspecified part of unspecified bronchus or lung: Secondary | ICD-10-CM

## 2011-11-01 NOTE — Progress Notes (Signed)
[facility] OFFICE PROGRESS NOTE  CC: Kalman Shan M.D., Ines Bloomer M.D., Oneita Hurt M.D., Dineen Kid. Reche Dixon M.D.   DIAGNOSIS: Recurrent non-small cell lung cancer, squamous cell carcinoma initially diagnosis stage IIIa (T1 N2 MX ) in August of 2010.   PRIOR THERAPY:  #1 status post concurrent chemoradiation with weekly carboplatin and paclitaxel, last dose of chemotherapy given 08/05/2011. #2 status post consolidation chemotherapy with carboplatin paclitaxel last dose given 10/27/2009. #3 Concurrent chemoradiation with weekly carboplatin for an AUC of 2 and paclitaxel at 45 mg per meter squared given concurrent with radiotherapy, last dose was given 09/20/2011.     CURRENT THERAPY: None.  INTERVAL HISTORY: Janice Ball 56 y.o. female returns to the clinic today for followup visit. The patient tolerated the previous course of concurrent chemoradiation fairly well. She denied having any significant chest pain or shortness of breath. She has no cough or hemoptysis. No significant weight loss. She has repeat CT scan of the chest performed recently and she is here today for evaluation and discussion of her scan results.  MEDICAL HISTORY: Past Medical History  Diagnosis Date  . Diabetes mellitus   . Anxiety   . Hyperlipidemia   . Hypothyroidism   . COPD (chronic obstructive pulmonary disease)   . Lung cancer     lung ca dx 8/10  . Arthritis     thumbs    ALLERGIES:  is allergic to sulfonamide derivatives; codeine; and penicillins.  MEDICATIONS:  Current Outpatient Prescriptions  Medication Sig Dispense Refill  . albuterol (VENTOLIN HFA) 108 (90 BASE) MCG/ACT inhaler Inhale 2 puffs into the lungs every 6 (six) hours as needed.        . Alum & Mag Hydroxide-Simeth (MAGIC MOUTHWASH) SOLN Take 5 mLs by mouth 4 (four) times daily.  240 mL  0  . aspirin 325 MG tablet Take 325 mg by mouth daily.        . budesonide-formoterol (SYMBICORT) 160-4.5 MCG/ACT  inhaler Inhale 2 puffs into the lungs 2 (two) times daily.  1 Inhaler  1  . Chromium 1000 MCG TABS Take 1 tablet by mouth daily.        . diclofenac-misoprostol (ARTHROTEC 75) 75-0.2 MG per tablet Take 1 tablet by mouth 2 (two) times daily.        . fenofibrate (TRICOR) 145 MG tablet Take 145 mg by mouth daily.        . fluconazole (DIFLUCAN) 100 MG tablet Take 1 tablet (100 mg total) by mouth daily. Take 2 stat, then 1 po daily until completed  16 tablet  0  . Guaifenesin (MUCINEX MAXIMUM STRENGTH) 1200 MG TB12 Take 1 tablet by mouth every 12 (twelve) hours as needed.        Marland Kitchen HYDROcodone-acetaminophen (NORCO) 5-325 MG per tablet Take 1 tablet by mouth every 6 (six) hours as needed. Written as vicodin 5mg  1 tab q 4-6hprn cough or pain po,dispence 25, no refill       . levothyroxine (SYNTHROID, LEVOTHROID) 125 MCG tablet Take 125 mcg by mouth daily.        Marland Kitchen LORazepam (ATIVAN) 1 MG tablet Take 1 mg by mouth every 6 (six) hours as needed.        . magnesium gluconate (MAGONATE) 500 MG tablet Take 1,000 mg by mouth daily.       . metFORMIN (GLUCOPHAGE) 500 MG tablet Take 500 mg by mouth 2 (two) times daily with a meal.        .  montelukast (SINGULAIR) 10 MG tablet Take 10 mg by mouth at bedtime.        . Multiple Vitamins-Minerals (CENTRUM SILVER PO) Take 1 tablet by mouth daily.        . niacin (NIASPAN) 1000 MG CR tablet Take 1,000 mg by mouth at bedtime.        . sertraline (ZOLOFT) 100 MG tablet Take 100 mg by mouth daily. Takes 1 1/2 tabs daily for total dose of 150 mg      . spironolactone (ALDACTONE) 25 MG tablet Take 25 mg by mouth daily.        . travoprost, benzalkonium, (TRAVATAN) 0.004 % ophthalmic solution Place 1 drop into both eyes at bedtime.        . vitamin B-12 (CYANOCOBALAMIN) 500 MCG tablet Take 500 mcg by mouth daily.          SURGICAL HISTORY:  Past Surgical History  Procedure Date  . Total abdominal hysterectomy   . Thyroidectomy, partial   . Tonsillectomy   . Tubal  ligation     REVIEW OF SYSTEMS:  A comprehensive review of systems was negative.   PHYSICAL EXAMINATION: General appearance: alert, cooperative and no distress Head: Normocephalic, without obvious abnormality, atraumatic Neck: no adenopathy Lymph nodes: Cervical, supraclavicular, and axillary nodes normal. Resp: clear to auscultation bilaterally Cardio: regular rate and rhythm, S1, S2 normal, no murmur, click, rub or gallop GI: soft, non-tender; bowel sounds normal; no masses,  no organomegaly Extremities: extremities normal, atraumatic, no cyanosis or edema Neurologic: Alert and oriented X 3, normal strength and tone. Normal symmetric reflexes. Normal coordination and gait  ECOG PERFORMANCE STATUS: 1 - Symptomatic but completely ambulatory  Blood pressure 138/65, pulse 105, temperature 98 F (36.7 C), temperature source Oral, height 5\' 8"  (1.727 m), weight 191 lb 3.2 oz (86.728 kg).  LABORATORY DATA: Lab Results  Component Value Date   WBC 5.4 10/28/2011   HGB 12.9 10/28/2011   HCT 36.8 10/28/2011   MCV 90.4 10/28/2011   PLT 189 10/28/2011      Chemistry      Component Value Date/Time   NA 139 10/28/2011 0812   NA 134* 09/20/2011 1223   K 3.9 10/28/2011 0812   K 4.2 09/20/2011 1223   CL 103 10/28/2011 0812   CL 101 09/20/2011 1223   CO2 27 10/28/2011 0812   CO2 22 09/20/2011 1223   BUN 11 10/28/2011 0812   BUN 11 09/20/2011 1223   CREATININE 0.4* 10/28/2011 0812   CREATININE 0.78 09/20/2011 1223      Component Value Date/Time   CALCIUM 8.5 10/28/2011 0812   CALCIUM 8.7 09/20/2011 1223   ALKPHOS 55 10/28/2011 0812   ALKPHOS 52 09/20/2011 1223   AST 21 10/28/2011 0812   AST 17 09/20/2011 1223   ALT 25 09/20/2011 1223   BILITOT 0.40 10/28/2011 0812   BILITOT 0.2* 09/20/2011 1223       RADIOGRAPHIC STUDIES: Ct Chest W Contrast  10/28/2011  *RADIOLOGY REPORT*  Clinical Data: Lung cancer restaging.  CT CHEST WITH CONTRAST  Technique:  Multidetector CT imaging of the chest was performed  following the standard protocol during bolus administration of intravenous contrast.  Contrast: 80mL OMNIPAQUE IOHEXOL 300 MG/ML IV SOLN  Comparison: PET CT 07/13/2011 and CT chest 07/01/2011.  Findings: Right thyroid appears surgically absent.  Low right paratracheal lymph node measures 1.3 cm (previously 1.5 cm when remeasured).  No hilar or axillary adenopathy.  Left coronary artery calcification.  Heart size normal.  No pericardial effusion.  Radiation fibrosis and volume loss are seen in the right perihilar region.  Scattered tiny nodular densities in the left lung measure 4 mm or less in size and are unchanged.  No pleural fluid.  A small diverticulum is seen along the right posterolateral aspect of the upper trachea, as before.  Airway is otherwise unremarkable.  Incidental imaging of the upper abdomen shows low attenuation throughout the visualized portion of the liver.  Minimal nodular thickening of the lateral limb right adrenal gland is stable.  No worrisome lytic or sclerotic lesions.  IMPRESSION:  1.  Interval decrease in size of right paratracheal adenopathy. 2.  Radiation fibrosis and volume loss in the right perihilar region, stable. 3.  Scattered tiny pulmonary nodules are unchanged. 4.  Hepatic steatosis.  Original Report Authenticated By: Reyes Ivan, M.D.    ASSESSMENT: This is a very pleasant 56 years old white female with recurrent non-small cell lung cancer status post concurrent chemoradiation with weekly carboplatin and paclitaxel. She has no evidence for disease progression on the current scan. I discussed the scan results with the patient. I gave him the option of observation versus systemic chemotherapy with single agent gemcitabine. After a lengthy discussion with the patient, she would like to proceed with systemic chemotherapy.  PLAN: She would be treated with systemic chemotherapy with single agent gemcitabine 1000 mg/M2 on days 1 and 8 every 3 Weeks. I discussed with the  patient adverse effect of this treatment including but not limited to alopecia, myelosuppression, nausea and vomiting, peripheral neuropathy, liver or renal dysfunction. The patient would like to proceed with treatment as planned. She is expected to start the first cycle of her treatment next week. She would come back for followup visit with the start of cycle #2. She was advised to call immediately she has any concerning symptoms in the interval.   All questions were answered. The patient knows to call the clinic with any problems, questions or concerns. We can certainly see the patient much sooner if necessary.  I spent 20 minutes counseling the patient face to face. The total time spent in the appointment was 40 minutes.

## 2011-11-01 NOTE — Telephone Encounter (Signed)
appts made and printed for 1/21 1/28 2/11 lab and tx and 2/4 alb and adrena

## 2011-11-08 ENCOUNTER — Other Ambulatory Visit: Payer: Self-pay | Admitting: Lab

## 2011-11-08 ENCOUNTER — Ambulatory Visit (HOSPITAL_BASED_OUTPATIENT_CLINIC_OR_DEPARTMENT_OTHER): Payer: Self-pay

## 2011-11-08 ENCOUNTER — Encounter: Payer: Self-pay | Admitting: Internal Medicine

## 2011-11-08 VITALS — BP 152/76 | HR 115 | Temp 96.4°F

## 2011-11-08 DIAGNOSIS — Z5111 Encounter for antineoplastic chemotherapy: Secondary | ICD-10-CM

## 2011-11-08 DIAGNOSIS — C3410 Malignant neoplasm of upper lobe, unspecified bronchus or lung: Secondary | ICD-10-CM

## 2011-11-08 DIAGNOSIS — C3490 Malignant neoplasm of unspecified part of unspecified bronchus or lung: Secondary | ICD-10-CM

## 2011-11-08 LAB — CBC WITH DIFFERENTIAL/PLATELET
Basophils Absolute: 0.1 10*3/uL (ref 0.0–0.1)
EOS%: 3 % (ref 0.0–7.0)
Eosinophils Absolute: 0.2 10*3/uL (ref 0.0–0.5)
HGB: 13.2 g/dL (ref 11.6–15.9)
MCH: 30.6 pg (ref 25.1–34.0)
NEUT#: 3.4 10*3/uL (ref 1.5–6.5)
RBC: 4.31 10*6/uL (ref 3.70–5.45)
RDW: 14.3 % (ref 11.2–14.5)
lymph#: 1.3 10*3/uL (ref 0.9–3.3)
nRBC: 0 % (ref 0–0)

## 2011-11-08 LAB — COMPREHENSIVE METABOLIC PANEL
ALT: 28 U/L (ref 0–35)
Albumin: 4.4 g/dL (ref 3.5–5.2)
Alkaline Phosphatase: 55 U/L (ref 39–117)
Glucose, Bld: 213 mg/dL — ABNORMAL HIGH (ref 70–99)
Potassium: 3.8 mEq/L (ref 3.5–5.3)
Sodium: 138 mEq/L (ref 135–145)
Total Bilirubin: 0.4 mg/dL (ref 0.3–1.2)
Total Protein: 6.9 g/dL (ref 6.0–8.3)

## 2011-11-08 MED ORDER — SODIUM CHLORIDE 0.9 % IV SOLN
Freq: Once | INTRAVENOUS | Status: AC
  Administered 2011-11-08: 14:00:00 via INTRAVENOUS

## 2011-11-08 MED ORDER — DEXAMETHASONE SODIUM PHOSPHATE 10 MG/ML IJ SOLN
10.0000 mg | Freq: Once | INTRAMUSCULAR | Status: AC
  Administered 2011-11-08: 10 mg via INTRAVENOUS

## 2011-11-08 MED ORDER — SODIUM CHLORIDE 0.9 % IV SOLN
1000.0000 mg/m2 | Freq: Once | INTRAVENOUS | Status: AC
  Administered 2011-11-08: 2052 mg via INTRAVENOUS
  Filled 2011-11-08: qty 54

## 2011-11-08 MED ORDER — ONDANSETRON 8 MG/50ML IVPB (CHCC)
8.0000 mg | Freq: Once | INTRAVENOUS | Status: AC
  Administered 2011-11-08: 8 mg via INTRAVENOUS

## 2011-11-08 NOTE — Progress Notes (Signed)
Pacilitaxel and carboplatin are non replaceable drugs.

## 2011-11-08 NOTE — Patient Instructions (Signed)
1505 Pt ambulatory upon discharge and verbalized understanding of next appt date/time.

## 2011-11-15 ENCOUNTER — Ambulatory Visit (HOSPITAL_BASED_OUTPATIENT_CLINIC_OR_DEPARTMENT_OTHER): Payer: Self-pay

## 2011-11-15 ENCOUNTER — Other Ambulatory Visit: Payer: Self-pay | Admitting: Lab

## 2011-11-15 ENCOUNTER — Other Ambulatory Visit: Payer: Self-pay | Admitting: Internal Medicine

## 2011-11-15 DIAGNOSIS — Z5111 Encounter for antineoplastic chemotherapy: Secondary | ICD-10-CM

## 2011-11-15 DIAGNOSIS — C3490 Malignant neoplasm of unspecified part of unspecified bronchus or lung: Secondary | ICD-10-CM

## 2011-11-15 LAB — CBC WITH DIFFERENTIAL/PLATELET
BASO%: 1.7 % (ref 0.0–2.0)
EOS%: 1.3 % (ref 0.0–7.0)
Eosinophils Absolute: 0 10*3/uL (ref 0.0–0.5)
LYMPH%: 42.4 % (ref 14.0–49.7)
MCH: 30.5 pg (ref 25.1–34.0)
MCHC: 34.4 g/dL (ref 31.5–36.0)
MCV: 88.7 fL (ref 79.5–101.0)
MONO%: 7.2 % (ref 0.0–14.0)
NEUT#: 1.1 10*3/uL — ABNORMAL LOW (ref 1.5–6.5)
Platelets: 114 10*3/uL — ABNORMAL LOW (ref 145–400)
RBC: 3.8 10*6/uL (ref 3.70–5.45)
RDW: 13.8 % (ref 11.2–14.5)

## 2011-11-15 LAB — COMPREHENSIVE METABOLIC PANEL
ALT: 81 U/L — ABNORMAL HIGH (ref 0–35)
AST: 50 U/L — ABNORMAL HIGH (ref 0–37)
Albumin: 4.4 g/dL (ref 3.5–5.2)
Alkaline Phosphatase: 59 U/L (ref 39–117)
Calcium: 9.1 mg/dL (ref 8.4–10.5)
Chloride: 101 mEq/L (ref 96–112)
Potassium: 4.2 mEq/L (ref 3.5–5.3)
Sodium: 135 mEq/L (ref 135–145)
Total Protein: 7.3 g/dL (ref 6.0–8.3)

## 2011-11-15 MED ORDER — ONDANSETRON 8 MG/50ML IVPB (CHCC)
8.0000 mg | Freq: Once | INTRAVENOUS | Status: AC
  Administered 2011-11-15: 8 mg via INTRAVENOUS

## 2011-11-15 MED ORDER — PEGFILGRASTIM INJECTION 6 MG/0.6ML: 6.0000 mg | Freq: Once | SUBCUTANEOUS | Status: DC

## 2011-11-15 MED ORDER — SODIUM CHLORIDE 0.9 % IV SOLN
Freq: Once | INTRAVENOUS | Status: AC
  Administered 2011-11-15: 14:00:00 via INTRAVENOUS

## 2011-11-15 MED ORDER — DEXAMETHASONE SODIUM PHOSPHATE 10 MG/ML IJ SOLN
10.0000 mg | Freq: Once | INTRAMUSCULAR | Status: AC
  Administered 2011-11-15: 10 mg via INTRAVENOUS

## 2011-11-15 MED ORDER — SODIUM CHLORIDE 0.9 % IV SOLN
1000.0000 mg/m2 | Freq: Once | INTRAVENOUS | Status: AC
  Administered 2011-11-15: 2052 mg via INTRAVENOUS
  Filled 2011-11-15: qty 54

## 2011-11-15 NOTE — Progress Notes (Unsigned)
Per Dr Synthia Innocent to treat patient today with gemzar and ANC 1.1 but needs to come in for neulasta tomorrow. Orders and appointment requested

## 2011-11-16 ENCOUNTER — Ambulatory Visit (HOSPITAL_BASED_OUTPATIENT_CLINIC_OR_DEPARTMENT_OTHER): Payer: Self-pay

## 2011-11-16 ENCOUNTER — Other Ambulatory Visit: Payer: Self-pay | Admitting: Certified Registered Nurse Anesthetist

## 2011-11-16 VITALS — BP 133/70 | HR 121 | Temp 97.8°F

## 2011-11-16 DIAGNOSIS — C3490 Malignant neoplasm of unspecified part of unspecified bronchus or lung: Secondary | ICD-10-CM

## 2011-11-16 DIAGNOSIS — Z5112 Encounter for antineoplastic immunotherapy: Secondary | ICD-10-CM

## 2011-11-16 MED ORDER — PEGFILGRASTIM INJECTION 6 MG/0.6ML
6.0000 mg | Freq: Once | SUBCUTANEOUS | Status: AC
  Administered 2011-11-16: 6 mg via SUBCUTANEOUS
  Filled 2011-11-16: qty 0.6

## 2011-11-21 ENCOUNTER — Other Ambulatory Visit: Payer: Self-pay | Admitting: Internal Medicine

## 2011-11-22 ENCOUNTER — Ambulatory Visit (HOSPITAL_BASED_OUTPATIENT_CLINIC_OR_DEPARTMENT_OTHER): Payer: Self-pay | Admitting: Physician Assistant

## 2011-11-22 ENCOUNTER — Telehealth: Payer: Self-pay | Admitting: Internal Medicine

## 2011-11-22 ENCOUNTER — Encounter: Payer: Self-pay | Admitting: Physician Assistant

## 2011-11-22 ENCOUNTER — Other Ambulatory Visit: Payer: Self-pay | Admitting: Lab

## 2011-11-22 VITALS — BP 123/69 | HR 117 | Temp 97.0°F | Ht 68.0 in | Wt 187.4 lb

## 2011-11-22 DIAGNOSIS — C3490 Malignant neoplasm of unspecified part of unspecified bronchus or lung: Secondary | ICD-10-CM

## 2011-11-22 LAB — CBC WITH DIFFERENTIAL/PLATELET
BASO%: 0.2 % (ref 0.0–2.0)
Eosinophils Absolute: 0 10*3/uL (ref 0.0–0.5)
LYMPH%: 14.2 % (ref 14.0–49.7)
MCH: 30.9 pg (ref 25.1–34.0)
MCHC: 34.8 g/dL (ref 31.5–36.0)
MCV: 88.7 fL (ref 79.5–101.0)
MONO%: 4.9 % (ref 0.0–14.0)
Platelets: 50 10*3/uL — ABNORMAL LOW (ref 145–400)
RBC: 3.82 10*6/uL (ref 3.70–5.45)
nRBC: 0 % (ref 0–0)

## 2011-11-22 LAB — COMPREHENSIVE METABOLIC PANEL
ALT: 92 U/L — ABNORMAL HIGH (ref 0–35)
CO2: 26 mEq/L (ref 19–32)
Creatinine, Ser: 0.72 mg/dL (ref 0.50–1.10)
Total Bilirubin: 0.3 mg/dL (ref 0.3–1.2)

## 2011-11-22 NOTE — Progress Notes (Signed)
[facility] OFFICE PROGRESS NOTE  CC: Kalman Shan M.D., Ines Bloomer M.D., Oneita Hurt M.D., Dineen Kid. Reche Dixon M.D.   DIAGNOSIS: Recurrent non-small cell lung cancer, squamous cell carcinoma initially diagnosis stage IIIa (T1 N2 MX ) in August of 2010.   PRIOR THERAPY:  #1 status post concurrent chemoradiation with weekly carboplatin and paclitaxel, last dose of chemotherapy given 08/05/2011. #2 status post consolidation chemotherapy with carboplatin paclitaxel last dose given 10/27/2009. #3 Concurrent chemoradiation with weekly carboplatin for an AUC of 2 and paclitaxel at 45 mg per meter squared given concurrent with radiotherapy, last dose was given 09/20/2011.     CURRENT THERAPY: Systemic chemotherapy with gemcitabine 1000 mg meter squared on days 1 and 8 every 3 weeks status post 1 cycle.  INTERVAL HISTORY: Janice Ball 56 y.o. female returns to the clinic today for followup visit. She tolerated her first cycle of chemotherapy with gemcitabine relatively well. She did have some mild nausea but it was well controlled with her Compazine. She denied having any significant chest pain or shortness of breath. She has no cough or hemoptysis. No significant weight loss. She denied any bleeding or bruising. 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  MEDICAL HISTORY: Past Medical History  Diagnosis Date  . Diabetes mellitus   . Anxiety   . Hyperlipidemia   . Hypothyroidism   . COPD (chronic obstructive pulmonary disease)   . Lung cancer     lung ca dx 8/10  . Arthritis     thumbs    ALLERGIES:  is allergic to sulfonamide derivatives; codeine; and penicillins.  MEDICATIONS:  Current Outpatient Prescriptions  Medication Sig Dispense Refill  . albuterol (VENTOLIN HFA) 108 (90 BASE) MCG/ACT inhaler Inhale 2 puffs into the lungs every 6 (six) hours as needed.        . Alum & Mag Hydroxide-Simeth  (MAGIC MOUTHWASH) SOLN Take 5 mLs by mouth 4 (four) times daily.  240 mL  0  . aspirin 325 MG tablet Take 325 mg by mouth daily.        . budesonide-formoterol (SYMBICORT) 160-4.5 MCG/ACT inhaler Inhale 2 puffs into the lungs 2 (two) times daily.  1 Inhaler  1  . Chromium 1000 MCG TABS Take 1 tablet by mouth daily.        . diclofenac-misoprostol (ARTHROTEC 75) 75-0.2 MG per tablet Take 1 tablet by mouth 2 (two) times daily.        . fenofibrate (TRICOR) 145 MG tablet Take 145 mg by mouth daily.        . Guaifenesin (MUCINEX MAXIMUM STRENGTH) 1200 MG TB12 Take 1 tablet by mouth every 12 (twelve) hours as needed.        Marland Kitchen HYDROcodone-acetaminophen (NORCO) 5-325 MG per tablet Take 1 tablet by mouth every 6 (six) hours as needed. Written as vicodin 5mg  1 tab q 4-6hprn cough or pain po,dispence 25, no refill       . levothyroxine (SYNTHROID, LEVOTHROID) 125 MCG tablet Take 125 mcg by mouth daily.        Marland Kitchen LORazepam (ATIVAN) 1 MG tablet Take 1 mg by mouth every 6 (six) hours as needed.        . magnesium gluconate (MAGONATE) 500 MG tablet Take 1,000 mg by mouth daily.       . metFORMIN (GLUCOPHAGE) 500 MG tablet Take 500 mg by mouth 2 (two) times daily with a meal.        .  montelukast (SINGULAIR) 10 MG tablet Take 10 mg by mouth at bedtime.        . Multiple Vitamins-Minerals (CENTRUM SILVER PO) Take 1 tablet by mouth daily.        . niacin (NIASPAN) 1000 MG CR tablet Take 1,000 mg by mouth at bedtime.        . sertraline (ZOLOFT) 100 MG tablet Take 100 mg by mouth daily. Takes 1 1/2 tabs daily for total dose of 150 mg      . spironolactone (ALDACTONE) 25 MG tablet Take 25 mg by mouth daily.        . travoprost, benzalkonium, (TRAVATAN) 0.004 % ophthalmic solution Place 1 drop into both eyes at bedtime.        . vitamin B-12 (CYANOCOBALAMIN) 500 MCG tablet Take 500 mcg by mouth daily.        . fluconazole (DIFLUCAN) 100 MG tablet Take 1 tablet (100 mg total) by mouth daily. Take 2 stat, then 1 po  daily until completed  16 tablet  0    SURGICAL HISTORY:  Past Surgical History  Procedure Date  . Total abdominal hysterectomy   . Thyroidectomy, partial   . Tonsillectomy   . Tubal ligation     REVIEW OF SYSTEMS:  A comprehensive review of systems was negative except for: Gastrointestinal: positive for nausea   PHYSICAL EXAMINATION: General appearance: alert, cooperative and no distress Head: Normocephalic, without obvious abnormality, atraumatic Neck: no adenopathy Lymph nodes: Cervical, supraclavicular, and axillary nodes normal. Resp: clear to auscultation bilaterally Cardio: regular rate and rhythm, S1, S2 normal, no murmur, click, rub or gallop GI: soft, non-tender; bowel sounds normal; no masses,  no organomegaly Extremities: extremities normal, atraumatic, no cyanosis or edema Neurologic: Alert and oriented X 3, normal strength and tone. Normal symmetric reflexes. Normal coordination and gait  ECOG PERFORMANCE STATUS: 1 - Symptomatic but completely ambulatory  Blood pressure 123/69, pulse 117, temperature 97 F (36.1 C), temperature source Oral, height 5\' 8"  (1.727 m), weight 187 lb 6.4 oz (85.004 kg).  LABORATORY DATA: Lab Results  Component Value Date   WBC 10.9* 11/22/2011   HGB 11.8 11/22/2011   HCT 33.9* 11/22/2011   MCV 88.7 11/22/2011   PLT 50* 11/22/2011      Chemistry      Component Value Date/Time   NA 135 11/15/2011 1338   NA 139 10/28/2011 0812   K 4.2 11/15/2011 1338   K 3.9 10/28/2011 0812   CL 101 11/15/2011 1338   CL 103 10/28/2011 0812   CO2 21 11/15/2011 1338   CO2 27 10/28/2011 0812   BUN 10 11/15/2011 1338   BUN 11 10/28/2011 0812   CREATININE 0.65 11/15/2011 1338   CREATININE 0.4* 10/28/2011 0812      Component Value Date/Time   CALCIUM 9.1 11/15/2011 1338   CALCIUM 8.5 10/28/2011 0812   ALKPHOS 59 11/15/2011 1338   ALKPHOS 55 10/28/2011 0812   AST 50* 11/15/2011 1338   AST 21 10/28/2011 0812   ALT 81* 11/15/2011 1338   BILITOT 0.3 11/15/2011 1338    BILITOT 0.40 10/28/2011 0812       RADIOGRAPHIC STUDIES: Ct Chest W Contrast  10/28/2011  *RADIOLOGY REPORT*  Clinical Data: Lung cancer restaging.  CT CHEST WITH CONTRAST  Technique:  Multidetector CT imaging of the chest was performed following the standard protocol during bolus administration of intravenous contrast.  Contrast: 80mL OMNIPAQUE IOHEXOL 300 MG/ML IV SOLN  Comparison: PET CT 07/13/2011 and CT chest 07/01/2011.  Findings: Right thyroid appears surgically absent.  Low right paratracheal lymph node measures 1.3 cm (previously 1.5 cm when remeasured).  No hilar or axillary adenopathy.  Left coronary artery calcification.  Heart size normal.  No pericardial effusion.  Radiation fibrosis and volume loss are seen in the right perihilar region.  Scattered tiny nodular densities in the left lung measure 4 mm or less in size and are unchanged.  No pleural fluid.  A small diverticulum is seen along the right posterolateral aspect of the upper trachea, as before.  Airway is otherwise unremarkable.  Incidental imaging of the upper abdomen shows low attenuation throughout the visualized portion of the liver.  Minimal nodular thickening of the lateral limb right adrenal gland is stable.  No worrisome lytic or sclerotic lesions.  IMPRESSION:  1.  Interval decrease in size of right paratracheal adenopathy. 2.  Radiation fibrosis and volume loss in the right perihilar region, stable. 3.  Scattered tiny pulmonary nodules are unchanged. 4.  Hepatic steatosis.  Original Report Authenticated By: Reyes Ivan, M.D.    ASSESSMENT/PLAN: This is a very pleasant 56 years old white female with recurrent non-small cell lung cancer status post concurrent chemoradiation with weekly carboplatin and paclitaxel. She has no evidence for disease progression on the most recent restaging scan. She is currently receiving systemic chemotherapy the form of gemcitabine at 1000 mg per meter squared on days 1 and 8 every 3 weeks,  status post 1 cycle. She is a bit thrombocytopenic today but is not having problems with bleeding or bruising. She is due to begin cycle #2 of her systemic chemotherapy with gemcitabine on 11/29/2011. Patient was discussed with Dr. Arbutus Ped. We fully expect her platelet count to recover so that she can proceed with her second cycle of chemotherapy however, if not  her chemotherapy  will be rescheduled accordingly. She's notify us if she develops any  bleeding or bruising  Janice Ball, Rodrickus Min E, PA-C   All questions were answered. The patient knows to call the clinic with any problems, questions or concerns. We can certainly see the patient much sooner if necessary.

## 2011-11-22 NOTE — Telephone Encounter (Signed)
Gv pt appt for [id number] °

## 2011-11-29 ENCOUNTER — Other Ambulatory Visit: Payer: Self-pay | Admitting: Lab

## 2011-11-29 ENCOUNTER — Ambulatory Visit (HOSPITAL_BASED_OUTPATIENT_CLINIC_OR_DEPARTMENT_OTHER): Payer: Self-pay

## 2011-11-29 VITALS — BP 147/77 | HR 120 | Temp 97.2°F

## 2011-11-29 DIAGNOSIS — C3490 Malignant neoplasm of unspecified part of unspecified bronchus or lung: Secondary | ICD-10-CM

## 2011-11-29 DIAGNOSIS — Z5111 Encounter for antineoplastic chemotherapy: Secondary | ICD-10-CM

## 2011-11-29 LAB — COMPREHENSIVE METABOLIC PANEL
AST: 16 U/L (ref 0–37)
Alkaline Phosphatase: 93 U/L (ref 39–117)
BUN: 7 mg/dL (ref 6–23)
Glucose, Bld: 363 mg/dL — ABNORMAL HIGH (ref 70–99)
Sodium: 136 mEq/L (ref 135–145)
Total Bilirubin: 0.2 mg/dL — ABNORMAL LOW (ref 0.3–1.2)

## 2011-11-29 LAB — CBC WITH DIFFERENTIAL/PLATELET
BASO%: 0.6 % (ref 0.0–2.0)
Basophils Absolute: 0.1 10*3/uL (ref 0.0–0.1)
EOS%: 1.2 % (ref 0.0–7.0)
HGB: 12.1 g/dL (ref 11.6–15.9)
MCH: 31.3 pg (ref 25.1–34.0)
MCHC: 34.2 g/dL (ref 31.5–36.0)
MCV: 91.5 fL (ref 79.5–101.0)
MONO%: 9.3 % (ref 0.0–14.0)
NEUT%: 75.6 % (ref 38.4–76.8)
RDW: 15.8 % — ABNORMAL HIGH (ref 11.2–14.5)

## 2011-11-29 MED ORDER — DEXAMETHASONE SODIUM PHOSPHATE 10 MG/ML IJ SOLN
10.0000 mg | Freq: Once | INTRAMUSCULAR | Status: AC
  Administered 2011-11-29: 10 mg via INTRAVENOUS

## 2011-11-29 MED ORDER — SODIUM CHLORIDE 0.9 % IV SOLN
Freq: Once | INTRAVENOUS | Status: AC
  Administered 2011-11-29: 14:00:00 via INTRAVENOUS

## 2011-11-29 MED ORDER — SODIUM CHLORIDE 0.9 % IV SOLN
1000.0000 mg/m2 | Freq: Once | INTRAVENOUS | Status: AC
  Administered 2011-11-29: 2052 mg via INTRAVENOUS
  Filled 2011-11-29: qty 54

## 2011-11-29 MED ORDER — ONDANSETRON 8 MG/50ML IVPB (CHCC)
8.0000 mg | Freq: Once | INTRAVENOUS | Status: AC
  Administered 2011-11-29: 8 mg via INTRAVENOUS

## 2011-11-29 NOTE — Patient Instructions (Signed)
[facility] Discharge Instructions for Patients Receiving Chemotherapy  Today you received the following chemotherapy agents Gemzar  To help prevent nausea and vomiting after your treatment, we encourage you to take your nausea medication as directed by MD   If you develop nausea and vomiting that is not controlled by your nausea medication, call the clinic. If it is after clinic hours your family physician or the after hours number for the clinic or go to the Emergency Department.   BELOW ARE SYMPTOMS THAT SHOULD BE REPORTED IMMEDIATELY:  *FEVER GREATER THAN 100.5 F  *CHILLS WITH OR WITHOUT FEVER  NAUSEA AND VOMITING THAT IS NOT CONTROLLED WITH YOUR NAUSEA MEDICATION  *UNUSUAL SHORTNESS OF BREATH  *UNUSUAL BRUISING OR BLEEDING  TENDERNESS IN MOUTH AND THROAT WITH OR WITHOUT PRESENCE OF ULCERS  *URINARY PROBLEMS  *BOWEL PROBLEMS  UNUSUAL RASH Items with * indicate a potential emergency and should be followed up as soon as possible.  One of the nurses will contact you 24 hours after your first treatment. Please let the nurse know about any problems that you may have experienced. Feel free to call the clinic you have any questions or concerns. The clinic phone number is [phone].   I have been informed and understand all the instructions given to me. I know to contact the clinic, my physician, or go to the Emergency Department if any problems should occur. I do not have any questions at this time, but understand that I may call the clinic during office hours   should I have any questions or need assistance in obtaining follow up care.    __________________________________________  _____________  __________ Signature of Patient or Authorized Representative            Date                   Time    __________________________________________ Nurse's Signature

## 2011-12-06 ENCOUNTER — Ambulatory Visit (HOSPITAL_BASED_OUTPATIENT_CLINIC_OR_DEPARTMENT_OTHER): Payer: Self-pay

## 2011-12-06 ENCOUNTER — Ambulatory Visit (HOSPITAL_BASED_OUTPATIENT_CLINIC_OR_DEPARTMENT_OTHER): Payer: Self-pay | Admitting: Physician Assistant

## 2011-12-06 ENCOUNTER — Telehealth: Payer: Self-pay | Admitting: Internal Medicine

## 2011-12-06 ENCOUNTER — Other Ambulatory Visit: Payer: Self-pay | Admitting: Lab

## 2011-12-06 ENCOUNTER — Encounter: Payer: Self-pay | Admitting: Physician Assistant

## 2011-12-06 DIAGNOSIS — C3490 Malignant neoplasm of unspecified part of unspecified bronchus or lung: Secondary | ICD-10-CM

## 2011-12-06 DIAGNOSIS — Z5111 Encounter for antineoplastic chemotherapy: Secondary | ICD-10-CM

## 2011-12-06 LAB — COMPREHENSIVE METABOLIC PANEL
Albumin: 3.9 g/dL (ref 3.5–5.2)
Alkaline Phosphatase: 79 U/L (ref 39–117)
BUN: 9 mg/dL (ref 6–23)
Glucose, Bld: 309 mg/dL — ABNORMAL HIGH (ref 70–99)
Potassium: 4.2 mEq/L (ref 3.5–5.3)

## 2011-12-06 LAB — CBC WITH DIFFERENTIAL/PLATELET
Basophils Absolute: 0.1 10*3/uL (ref 0.0–0.1)
EOS%: 0.2 % (ref 0.0–7.0)
HCT: 33.7 % — ABNORMAL LOW (ref 34.8–46.6)
HGB: 11.7 g/dL (ref 11.6–15.9)
MCH: 31.2 pg (ref 25.1–34.0)
MCV: 89.9 fL (ref 79.5–101.0)
MONO%: 14.3 % — ABNORMAL HIGH (ref 0.0–14.0)
NEUT%: 59.2 % (ref 38.4–76.8)
Platelets: 254 10*3/uL (ref 145–400)
lymph#: 1 10*3/uL (ref 0.9–3.3)

## 2011-12-06 MED ORDER — DEXAMETHASONE SODIUM PHOSPHATE 10 MG/ML IJ SOLN
10.0000 mg | Freq: Once | INTRAMUSCULAR | Status: AC
  Administered 2011-12-06: 10 mg via INTRAVENOUS

## 2011-12-06 MED ORDER — SODIUM CHLORIDE 0.9 % IV SOLN
Freq: Once | INTRAVENOUS | Status: AC
  Administered 2011-12-06: 14:00:00 via INTRAVENOUS

## 2011-12-06 MED ORDER — GEMCITABINE HCL CHEMO INJECTION 1 GM
1000.0000 mg/m2 | Freq: Once | INTRAVENOUS | Status: AC
  Administered 2011-12-06: 2052 mg via INTRAVENOUS
  Filled 2011-12-06: qty 54

## 2011-12-06 MED ORDER — PROCHLORPERAZINE MALEATE 10 MG PO TABS: 10.0000 mg | ORAL_TABLET | Freq: Four times a day (QID) | ORAL | Status: DC | PRN

## 2011-12-06 MED ORDER — ONDANSETRON 8 MG/50ML IVPB (CHCC)
8.0000 mg | Freq: Once | INTRAVENOUS | Status: AC
  Administered 2011-12-06: 8 mg via INTRAVENOUS

## 2011-12-06 NOTE — Patient Instructions (Signed)
[facility] Discharge Instructions for Patients Receiving Chemotherapy  Today you received the following chemotherapy agents gemzar   To help prevent nausea and vomiting after your treatment, we encourage you to take your nausea medication per MD instructions Begin taking it at 630pm and take it as often as prescribed for the next 24 hours.   If you develop nausea and vomiting that is not controlled by your nausea medication, call the clinic. If it is after clinic hours your family physician or the after hours number for the clinic or go to the Emergency Department.   BELOW ARE SYMPTOMS THAT SHOULD BE REPORTED IMMEDIATELY:  *FEVER GREATER THAN 100.5 F  *CHILLS WITH OR WITHOUT FEVER  NAUSEA AND VOMITING THAT IS NOT CONTROLLED WITH YOUR NAUSEA MEDICATION  *UNUSUAL SHORTNESS OF BREATH  *UNUSUAL BRUISING OR BLEEDING  TENDERNESS IN MOUTH AND THROAT WITH OR WITHOUT PRESENCE OF ULCERS  *URINARY PROBLEMS  *BOWEL PROBLEMS  UNUSUAL RASH Items with * indicate a potential emergency and should be followed up as soon as possible.  One of the nurses will contact you 24 hours after your treatment. Please let the nurse know about any problems that you may have experienced. Feel free to call the clinic you have any questions or concerns. The clinic phone number is [phone].   I have been informed and understand all the instructions given to me. I know to contact the clinic, my physician, or go to the Emergency Department if any problems should occur. I do not have any questions at this time, but understand that I may call the clinic during office hours   should I have any questions or need assistance in obtaining follow up care.    __________________________________________  _____________  __________ Signature of Patient or Authorized Representative            Date                   Time    __________________________________________ Nurse's Signature

## 2011-12-06 NOTE — Telephone Encounter (Signed)
appt made and printed for 3/4 and [facility] to add 3/11 tx  aom

## 2011-12-07 NOTE — Progress Notes (Signed)
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   PRIOR THERAPY:  #1 status post concurrent chemoradiation with weekly carboplatin and paclitaxel, last dose of chemotherapy given 08/05/2011. #2 status post consolidation chemotherapy with carboplatin paclitaxel last dose given 10/27/2009. #3 Concurrent chemoradiation with weekly carboplatin for an AUC of 2 and paclitaxel at 45 mg per meter squared given concurrent with radiotherapy, last dose was given 09/20/2011.     CURRENT THERAPY: Systemic chemotherapy with gemcitabine 1000 mg meter squared on days 1 and 8 every 3 weeks status post 1 cycle and  Cycle 2 day 1.  INTERVAL HISTORY: Janice Ball 56 y.o. female returns to the clinic today for followup visit. She tolerated her first cycle of chemotherapy with gemcitabine relatively well. She did have some mild nausea but it was well controlled with her Compazine. She requests a refill for her Compazine as well as for her Ativan. She denied having any significant chest pain or shortness of breath. She has no cough or hemoptysis. No significant weight loss. She denied any bleeding or bruising. She notes that the right side of her neck is swollen and tends to be more so towards the evening. She also notes some nodularity to the veins in her left forearm from where she's received chemotherapy.  MEDICAL HISTORY: Past Medical History  Diagnosis Date  . Diabetes mellitus   . Anxiety   . Hyperlipidemia   . Hypothyroidism   . COPD (chronic obstructive pulmonary disease)   . Lung Ball     lung ca dx 8/10  . Arthritis     thumbs    ALLERGIES:  is allergic to sulfonamide derivatives; codeine; and penicillins.  MEDICATIONS:  Current Outpatient Prescriptions    Medication Sig Dispense Refill  . albuterol (VENTOLIN HFA) 108 (90 BASE) MCG/ACT inhaler Inhale 2 puffs into the lungs every 6 (six) hours as needed.        . Alum & Mag Hydroxide-Simeth (MAGIC MOUTHWASH) SOLN Take 5 mLs by mouth 4 (four) times daily.  240 mL  0  . aspirin 325 MG tablet Take 325 mg by mouth daily.        . budesonide-formoterol (SYMBICORT) 160-4.5 MCG/ACT inhaler Inhale 2 puffs into the lungs 2 (two) times daily.  1 Inhaler  1  . Chromium 1000 MCG TABS Take 1 tablet by mouth daily.        . diclofenac-misoprostol (ARTHROTEC 75) 75-0.2 MG per tablet Take 1 tablet by mouth 2 (two) times daily.        . fenofibrate (TRICOR) 145 MG tablet Take 145 mg by mouth daily.        . fluconazole (DIFLUCAN) 100 MG tablet Take 1 tablet (100 mg total) by mouth daily. Take 2 stat, then 1 po daily until completed  16 tablet  0  . Guaifenesin (MUCINEX MAXIMUM STRENGTH) 1200 MG TB12 Take 1 tablet by mouth every 12 (twelve) hours as needed.        Marland Kitchen HYDROcodone-acetaminophen (NORCO) 5-325 MG per tablet Take 1 tablet by mouth every 6 (six) hours as needed. Written as vicodin 5mg  1 tab q 4-6hprn cough or pain po,dispence 25, no refill       . levothyroxine (SYNTHROID, LEVOTHROID) 125 MCG tablet Take 125 mcg by mouth daily.        Marland Kitchen  LORazepam (ATIVAN) 1 MG tablet Take 1 mg by mouth every 6 (six) hours as needed.        . magnesium gluconate (MAGONATE) 500 MG tablet Take 1,000 mg by mouth daily.       . metFORMIN (GLUCOPHAGE) 500 MG tablet Take 500 mg by mouth 2 (two) times daily with a meal.        . montelukast (SINGULAIR) 10 MG tablet Take 10 mg by mouth at bedtime.        . Multiple Vitamins-Minerals (CENTRUM SILVER PO) Take 1 tablet by mouth daily.        . niacin (NIASPAN) 1000 MG CR tablet Take 1,000 mg by mouth at bedtime.        . prochlorperazine (COMPAZINE) 10 MG tablet Take 1 tablet (10 mg total) by mouth every 6 (six) hours as needed.  30 tablet  1  . sertraline (ZOLOFT) 100 MG tablet Take  100 mg by mouth daily. Takes 1 1/2 tabs daily for total dose of 150 mg      . spironolactone (ALDACTONE) 25 MG tablet Take 25 mg by mouth daily.        . travoprost, benzalkonium, (TRAVATAN) 0.004 % ophthalmic solution Place 1 drop into both eyes at bedtime.        . vitamin B-12 (CYANOCOBALAMIN) 500 MCG tablet Take 500 mcg by mouth daily.          SURGICAL HISTORY:  Past Surgical History  Procedure Date  . Total abdominal hysterectomy   . Thyroidectomy, partial   . Tonsillectomy   . Tubal ligation     REVIEW OF SYSTEMS:  A comprehensive review of systems was negative except for: Ears, nose, mouth, throat, and face: positive for Swelling affecting the right side of the neck Gastrointestinal: positive for nausea Nodularity affecting the veins in the left forearm   PHYSICAL EXAMINATION: General appearance: alert, cooperative and no distress Head: Normocephalic, without obvious abnormality, atraumatic Neck: no distinct adenopathy, and there is thickness and mild soft tissue swelling to the right side of the neck without discrete mass Lymph nodes: Cervical, supraclavicular, and axillary nodes normal. Resp: clear to auscultation bilaterally Cardio: regular rate and rhythm, S1, S2 normal, no murmur, click, rub or gallop GI: soft, non-tender; bowel sounds normal; no masses,  no organomegaly Extremities: extremities normal, atraumatic, no cyanosis or edema Neurologic: Alert and oriented X 3, normal strength and tone. Normal symmetric reflexes. Normal coordination and gait  ECOG PERFORMANCE STATUS: 1 - Symptomatic but completely ambulatory  Blood pressure 142/74, pulse 104, temperature 96.7 F (35.9 C), temperature source Oral, height 5\' 8"  (1.727 m), weight 186 lb 3.2 oz (84.46 kg).  LABORATORY DATA: Lab Results  Component Value Date   WBC 4.1 12/06/2011   HGB 11.7 12/06/2011   HCT 33.7* 12/06/2011   MCV 89.9 12/06/2011   PLT 254 12/06/2011      Chemistry      Component Value  Date/Time   NA 135 12/06/2011 1137   NA 139 10/28/2011 0812   K 4.2 12/06/2011 1137   K 3.9 10/28/2011 0812   CL 101 12/06/2011 1137   CL 103 10/28/2011 0812   CO2 20 12/06/2011 1137   CO2 27 10/28/2011 0812   BUN 9 12/06/2011 1137   BUN 11 10/28/2011 0812   CREATININE 0.69 12/06/2011 1137   CREATININE 0.4* 10/28/2011 0812      Component Value Date/Time   CALCIUM 8.3* 12/06/2011 1137   CALCIUM 8.5 10/28/2011 1610  ALKPHOS 79 12/06/2011 1137   ALKPHOS 55 10/28/2011 0812   AST 41* 12/06/2011 1137   AST 21 10/28/2011 0812   ALT 61* 12/06/2011 1137   BILITOT 0.2* 12/06/2011 1137   BILITOT 0.40 10/28/2011 0812       RADIOGRAPHIC STUDIES: Ct Chest W Contrast  10/28/2011  *RADIOLOGY REPORT*  Clinical Data: Lung Ball restaging.  CT CHEST WITH CONTRAST  Technique:  Multidetector CT imaging of the chest was performed following the standard protocol during bolus administration of intravenous contrast.  Contrast: 80mL OMNIPAQUE IOHEXOL 300 MG/ML IV SOLN  Comparison: PET CT 07/13/2011 and CT chest 07/01/2011.  Findings: Right thyroid appears surgically absent.  Low right paratracheal lymph node measures 1.3 cm (previously 1.5 cm when remeasured).  No hilar or axillary adenopathy.  Left coronary artery calcification.  Heart size normal.  No pericardial effusion.  Radiation fibrosis and volume loss are seen in the right perihilar region.  Scattered tiny nodular densities in the left lung measure 4 mm or less in size and are unchanged.  No pleural fluid.  A small diverticulum is seen along the right posterolateral aspect of the upper trachea, as before.  Airway is otherwise unremarkable.  Incidental imaging of the upper abdomen shows low attenuation throughout the visualized portion of the liver.  Minimal nodular thickening of the lateral limb right adrenal gland is stable.  No worrisome lytic or sclerotic lesions.  IMPRESSION:  1.  Interval decrease in size of right paratracheal adenopathy. 2.  Radiation fibrosis and  volume loss in the right perihilar region, stable. 3.  Scattered tiny pulmonary nodules are unchanged. 4.  Hepatic steatosis.  Original Report Authenticated By: Reyes Ivan, M.D.    ASSESSMENT/PLAN: This is a very pleasant 56 years old white female with recurrent non-small cell lung Ball status post concurrent chemoradiation with weekly carboplatin and paclitaxel. She has no evidence for disease progression on the most recent restaging scan. She is currently receiving systemic chemotherapy the form of gemcitabine at 1000 mg per meter squared on days 1 and 8 every 3 weeks, status post 1 cycle as well as a day 1 of cycle 2. The patient was discussed with Dr. Arbutus Ped and she will proceed with day 8 of cycle 2 today as scheduled. Her Compazine was refilled and sent via [facility] to her pharmacy of record. She was given a prescription for Ativan 1 mg tablets to be taken once or twice daily as needed for anxiety total 30 with no refill. She will return in 2 weeks with repeat CBC differential and C. met prior to beginning of cycle #3. When we schedule her restaging CT scans we will also include the neck to evaluate this area as well.   Laural Benes, Gradyn Shein E ,PA-C   All questions were answered. The patient knows to call the clinic with any problems, questions or concerns. We can certainly see the patient much sooner if necessary.

## 2011-12-13 ENCOUNTER — Telehealth: Payer: Self-pay | Admitting: Internal Medicine

## 2011-12-13 NOTE — Telephone Encounter (Addendum)
Pt left message reporting her left hand ( specifically last 2 fingers)  feel numb and skin is very tender and achy.  I returned pts call . She states these symptoms started last thursday and the skin over both forearms from elbows to hands and her knuckles are very tender. .She denies warmth or redness to areas . She has taken tylenol and she says that takes the " edge off  of the achiness". Her GEmzar was administered in her right arm last week.  I told her I will notify Dr Donnald Garre , but not aware of any intervention at this time except to monitor for worsening symptoms.

## 2011-12-14 ENCOUNTER — Telehealth: Payer: Self-pay | Admitting: Internal Medicine

## 2011-12-14 NOTE — Telephone Encounter (Signed)
I called pt and she was not available and will be home this evening. I left message to have pt return my call .

## 2011-12-14 NOTE — Telephone Encounter (Signed)
Message copied by Charma Igo on Tue Dec 14, 2011 11:56 AM ------      Message from: Si Gaul      Created: Mon Dec 13, 2011 11:28 PM       Monitor for now. Will see her if it gets worse.      ----- Message -----         From: Charma Igo         Sent: 12/13/2011   3:39 PM           To: Mohamed K. Arbutus Ped, MD            FYI- Pt left message reporting her left hand ( specifically last 2 fingers)  feel numb and skin is very tender and achy.                  I returned pts call . She states these symptoms started last thursday and the skin over both forearms from elbows to hands and her knuckles are very tender. .She denies warmth or redness to areas . She has taken tylenol which  takes the " edge off  of the achiness". Her Gemzar was administered in her right arm last week.  I told her I will notify Dr Donnald Garre , but not aware of any intervention at this time except to monitor for worsening symptoms.

## 2011-12-19 ENCOUNTER — Other Ambulatory Visit: Payer: Self-pay | Admitting: Internal Medicine

## 2011-12-20 ENCOUNTER — Ambulatory Visit (HOSPITAL_BASED_OUTPATIENT_CLINIC_OR_DEPARTMENT_OTHER): Payer: Self-pay | Admitting: Physician Assistant

## 2011-12-20 ENCOUNTER — Other Ambulatory Visit: Payer: Self-pay | Admitting: Lab

## 2011-12-20 ENCOUNTER — Ambulatory Visit (HOSPITAL_BASED_OUTPATIENT_CLINIC_OR_DEPARTMENT_OTHER): Payer: Self-pay

## 2011-12-20 ENCOUNTER — Telehealth: Payer: Self-pay | Admitting: *Deleted

## 2011-12-20 VITALS — BP 134/78 | HR 107 | Temp 97.0°F | Ht 68.0 in | Wt 186.4 lb

## 2011-12-20 DIAGNOSIS — C3490 Malignant neoplasm of unspecified part of unspecified bronchus or lung: Secondary | ICD-10-CM

## 2011-12-20 DIAGNOSIS — Z5111 Encounter for antineoplastic chemotherapy: Secondary | ICD-10-CM

## 2011-12-20 LAB — COMPREHENSIVE METABOLIC PANEL
ALT: 39 U/L — ABNORMAL HIGH (ref 0–35)
Albumin: 4.4 g/dL (ref 3.5–5.2)
CO2: 20 mEq/L (ref 19–32)
Calcium: 9 mg/dL (ref 8.4–10.5)
Chloride: 99 mEq/L (ref 96–112)
Glucose, Bld: 391 mg/dL — ABNORMAL HIGH (ref 70–99)
Potassium: 4.3 mEq/L (ref 3.5–5.3)
Sodium: 133 mEq/L — ABNORMAL LOW (ref 135–145)
Total Protein: 6.9 g/dL (ref 6.0–8.3)

## 2011-12-20 LAB — CBC WITH DIFFERENTIAL/PLATELET
Basophils Absolute: 0.1 10*3/uL (ref 0.0–0.1)
Eosinophils Absolute: 0.3 10*3/uL (ref 0.0–0.5)
HGB: 12.2 g/dL (ref 11.6–15.9)
MCV: 91.6 fL (ref 79.5–101.0)
MONO#: 0.6 10*3/uL (ref 0.1–0.9)
NEUT#: 6.3 10*3/uL (ref 1.5–6.5)
Platelets: 320 10*3/uL (ref 145–400)
RBC: 3.93 10*6/uL (ref 3.70–5.45)
RDW: 16.1 % — ABNORMAL HIGH (ref 11.2–14.5)
WBC: 8.3 10*3/uL (ref 3.9–10.3)
nRBC: 0 % (ref 0–0)

## 2011-12-20 MED ORDER — HEPARIN SOD (PORK) LOCK FLUSH 100 UNIT/ML IV SOLN
500.0000 [IU] | Freq: Once | INTRAVENOUS | Status: DC | PRN
  Filled 2011-12-20: qty 5

## 2011-12-20 MED ORDER — DEXAMETHASONE SODIUM PHOSPHATE 10 MG/ML IJ SOLN
10.0000 mg | Freq: Once | INTRAMUSCULAR | Status: AC
  Administered 2011-12-20: 10 mg via INTRAVENOUS

## 2011-12-20 MED ORDER — SODIUM CHLORIDE 0.9 % IJ SOLN
10.0000 mL | INTRAMUSCULAR | Status: DC | PRN
  Filled 2011-12-20: qty 10

## 2011-12-20 MED ORDER — LORAZEPAM 1 MG PO TABS: 1.0000 mg | ORAL_TABLET | Freq: Four times a day (QID) | ORAL | Status: DC | PRN

## 2011-12-20 MED ORDER — SODIUM CHLORIDE 0.9 % IV SOLN
1000.0000 mg/m2 | Freq: Once | INTRAVENOUS | Status: AC
  Administered 2011-12-20: 2052 mg via INTRAVENOUS
  Filled 2011-12-20: qty 54

## 2011-12-20 MED ORDER — SODIUM CHLORIDE 0.9 % IV SOLN
Freq: Once | INTRAVENOUS | Status: AC
  Administered 2011-12-20: 13:00:00 via INTRAVENOUS

## 2011-12-20 MED ORDER — ONDANSETRON 8 MG/50ML IVPB (CHCC)
8.0000 mg | Freq: Once | INTRAVENOUS | Status: AC
  Administered 2011-12-20: 8 mg via INTRAVENOUS

## 2011-12-20 NOTE — Patient Instructions (Signed)
Patient aware of next appointment; discharged home with no complaints. 

## 2011-12-21 NOTE — Progress Notes (Signed)
[facility] OFFICE PROGRESS NOTE  CC: Kalman Shan M.D., Ines Bloomer M.D., Oneita Hurt M.D., Dineen Kid. Reche Dixon M.D.   DIAGNOSIS: Recurrent non-small cell lung cancer, squamous cell carcinoma initially diagnosis stage IIIa (T1 N2 MX ) in August of 2010.   PRIOR THERAPY:  #1 status post concurrent chemoradiation with weekly carboplatin and paclitaxel, last dose of chemotherapy given 08/05/2011. #2 status post consolidation chemotherapy with carboplatin paclitaxel last dose given 10/27/2009. #3 Concurrent chemoradiation with weekly carboplatin for an AUC of 2 and paclitaxel at 45 mg per meter squared given concurrent with radiotherapy, last dose was given 09/20/2011.     CURRENT THERAPY: Systemic chemotherapy with gemcitabine 1000 mg meter squared on days 1 and 8 every 3 weeks status post 2 cycles   INTERVAL HISTORY: Janice Ball 56 y.o. female returns to the clinic today for followup visit.  She requests a refill for her Ativan. She denied having any significant chest pain or shortness of breath. She has no cough or hemoptysis. No significant weight loss. She denied any bleeding or bruising. She does report a one week history of increased sensitivity to the skin  On her arms from the elbow to her hands, however this has resolved. She voiced no other complaints.  MEDICAL HISTORY: Past Medical History  Diagnosis Date  . Diabetes mellitus   . Anxiety   . Hyperlipidemia   . Hypothyroidism   . COPD (chronic obstructive pulmonary disease)   . Lung cancer     lung ca dx 8/10  . Arthritis     thumbs    ALLERGIES:  is allergic to sulfonamide derivatives; codeine; and penicillins.  MEDICATIONS:  Current Outpatient Prescriptions  Medication Sig Dispense Refill  . albuterol (VENTOLIN HFA) 108 (90 BASE) MCG/ACT inhaler Inhale 2 puffs into the lungs every 6 (six) hours as needed.        . Alum & Mag Hydroxide-Simeth (MAGIC MOUTHWASH) SOLN Take 5 mLs by mouth 4  (four) times daily.  240 mL  0  . aspirin 325 MG tablet Take 325 mg by mouth daily.        . budesonide-formoterol (SYMBICORT) 160-4.5 MCG/ACT inhaler Inhale 2 puffs into the lungs 2 (two) times daily.  1 Inhaler  1  . Chromium 1000 MCG TABS Take 1 tablet by mouth daily.        . diclofenac-misoprostol (ARTHROTEC 75) 75-0.2 MG per tablet Take 1 tablet by mouth 2 (two) times daily.        . fenofibrate (TRICOR) 145 MG tablet Take 145 mg by mouth daily.        . fluconazole (DIFLUCAN) 100 MG tablet Take 1 tablet (100 mg total) by mouth daily. Take 2 stat, then 1 po daily until completed  16 tablet  0  . Guaifenesin (MUCINEX MAXIMUM STRENGTH) 1200 MG TB12 Take 1 tablet by mouth every 12 (twelve) hours as needed.        Marland Kitchen HYDROcodone-acetaminophen (NORCO) 5-325 MG per tablet Take 1 tablet by mouth every 6 (six) hours as needed. Written as vicodin 5mg  1 tab q 4-6hprn cough or pain po,dispence 25, no refill       . levothyroxine (SYNTHROID, LEVOTHROID) 125 MCG tablet Take 125 mcg by mouth daily.        Marland Kitchen LORazepam (ATIVAN) 1 MG tablet Take 1 tablet (1 mg total) by mouth every 6 (six) hours as needed.  30 tablet  0  . magnesium gluconate (MAGONATE) 500 MG tablet Take  1,000 mg by mouth daily.       . metFORMIN (GLUCOPHAGE) 500 MG tablet Take 500 mg by mouth 2 (two) times daily with a meal.        . montelukast (SINGULAIR) 10 MG tablet Take 10 mg by mouth at bedtime.        . Multiple Vitamins-Minerals (CENTRUM SILVER PO) Take 1 tablet by mouth daily.        . niacin (NIASPAN) 1000 MG CR tablet Take 1,000 mg by mouth at bedtime.        . sertraline (ZOLOFT) 100 MG tablet Take 100 mg by mouth daily. Takes 1 1/2 tabs daily for total dose of 150 mg      . spironolactone (ALDACTONE) 25 MG tablet Take 25 mg by mouth daily.        . travoprost, benzalkonium, (TRAVATAN) 0.004 % ophthalmic solution Place 1 drop into both eyes at bedtime.        . vitamin B-12 (CYANOCOBALAMIN) 500 MCG tablet Take 500 mcg by mouth  daily.         No current facility-administered medications for this visit.   Facility-Administered Medications Ordered in Other Visits  Medication Dose Route Frequency Provider Last Rate Last Dose  . DISCONTD: heparin lock flush 100 unit/mL  500 Units Intracatheter Once PRN Mohamed K. Mohamed, MD      . DISCONTD: sodium chloride 0.9 % injection 10 mL  10 mL Intracatheter PRN Mohamed K. Arbutus Ped, MD        SURGICAL HISTORY:  Past Surgical History  Procedure Date  . Total abdominal hysterectomy   . Thyroidectomy, partial   . Tonsillectomy   . Tubal ligation     REVIEW OF SYSTEMS:  Pertinent items are noted in HPI.   PHYSICAL EXAMINATION: General appearance: alert, cooperative and no distress Head: Normocephalic, without obvious abnormality, atraumatic Neck: no distinct adenopathy,  Lymph nodes: Cervical, supraclavicular, and axillary nodes normal. Resp: clear to auscultation bilaterally Cardio: regular rate and rhythm, S1, S2 normal, no murmur, click, rub or gallop GI: soft, non-tender; bowel sounds normal; no masses,  no organomegaly Extremities: extremities normal, atraumatic, no cyanosis or edema Neurologic: Alert and oriented X 3, normal strength and tone. Normal symmetric reflexes. Normal coordination and gait  ECOG PERFORMANCE STATUS: 1 - Symptomatic but completely ambulatory  Blood pressure 134/78, pulse 107, temperature 97 F (36.1 C), temperature source Oral, height 5\' 8"  (1.727 m), weight 186 lb 6.4 oz (84.55 kg).  LABORATORY DATA: Lab Results  Component Value Date   WBC 8.3 12/20/2011   HGB 12.2 12/20/2011   HCT 36.0 12/20/2011   MCV 91.6 12/20/2011   PLT 320 12/20/2011      Chemistry      Component Value Date/Time   NA 133* 12/20/2011 1200   NA 139 10/28/2011 0812   K 4.3 12/20/2011 1200   K 3.9 10/28/2011 0812   CL 99 12/20/2011 1200   CL 103 10/28/2011 0812   CO2 20 12/20/2011 1200   CO2 27 10/28/2011 0812   BUN 7 12/20/2011 1200   BUN 11 10/28/2011 0812   CREATININE  0.79 12/20/2011 1200   CREATININE 0.4* 10/28/2011 0812      Component Value Date/Time   CALCIUM 9.0 12/20/2011 1200   CALCIUM 8.5 10/28/2011 0812   ALKPHOS 66 12/20/2011 1200   ALKPHOS 55 10/28/2011 0812   AST 31 12/20/2011 1200   AST 21 10/28/2011 0812   ALT 39* 12/20/2011 1200   BILITOT 0.2* 12/20/2011  1200   BILITOT 0.40 10/28/2011 9604       RADIOGRAPHIC STUDIES: Ct Chest W Contrast  10/28/2011  *RADIOLOGY REPORT*  Clinical Data: Lung cancer restaging.  CT CHEST WITH CONTRAST  Technique:  Multidetector CT imaging of the chest was performed following the standard protocol during bolus administration of intravenous contrast.  Contrast: 80mL OMNIPAQUE IOHEXOL 300 MG/ML IV SOLN  Comparison: PET CT 07/13/2011 and CT chest 07/01/2011.  Findings: Right thyroid appears surgically absent.  Low right paratracheal lymph node measures 1.3 cm (previously 1.5 cm when remeasured).  No hilar or axillary adenopathy.  Left coronary artery calcification.  Heart size normal.  No pericardial effusion.  Radiation fibrosis and volume loss are seen in the right perihilar region.  Scattered tiny nodular densities in the left lung measure 4 mm or less in size and are unchanged.  No pleural fluid.  A small diverticulum is seen along the right posterolateral aspect of the upper trachea, as before.  Airway is otherwise unremarkable.  Incidental imaging of the upper abdomen shows low attenuation throughout the visualized portion of the liver.  Minimal nodular thickening of the lateral limb right adrenal gland is stable.  No worrisome lytic or sclerotic lesions.  IMPRESSION:  1.  Interval decrease in size of right paratracheal adenopathy. 2.  Radiation fibrosis and volume loss in the right perihilar region, stable. 3.  Scattered tiny pulmonary nodules are unchanged. 4.  Hepatic steatosis.  Original Report Authenticated By: Janice Ball, M.D.    ASSESSMENT/PLAN: This is a very pleasant 56 years old white female with recurrent  non-small cell lung cancer status post concurrent chemoradiation with weekly carboplatin and paclitaxel. She has no evidence for disease progression on the most recent restaging scan. She is currently receiving systemic chemotherapy the form of gemcitabine at 1000 mg per meter squared on days 1 and 8 every 3 weeks, status post 2 cycles.  The patient was discussed with Dr. Arbutus Ped. She will proceed with her third cycle of her systemic chemotherapy with gemcitabine. She was given a prescription for Ativan 1 mg tablets to be taken once or twice daily as needed for anxiety total 30 with no refill. She will return in 3 weeks with repeat CBC differential and C. Met and a CT of the chest with contrast to reevaluate her disease.  Laural Benes, Renny Gunnarson E ,PA-C   All questions were answered. The patient knows to call the clinic with any problems, questions or concerns. We can certainly see the patient much sooner if necessary.

## 2011-12-22 ENCOUNTER — Telehealth: Payer: Self-pay | Admitting: Internal Medicine

## 2011-12-22 NOTE — Telephone Encounter (Signed)
Called pt, left message to get calendar for March 2013, all chemo appts are in

## 2011-12-27 ENCOUNTER — Other Ambulatory Visit (HOSPITAL_BASED_OUTPATIENT_CLINIC_OR_DEPARTMENT_OTHER): Payer: Self-pay | Admitting: Lab

## 2011-12-27 ENCOUNTER — Ambulatory Visit (HOSPITAL_BASED_OUTPATIENT_CLINIC_OR_DEPARTMENT_OTHER): Payer: Self-pay

## 2011-12-27 VITALS — BP 123/71 | HR 106 | Temp 97.7°F

## 2011-12-27 DIAGNOSIS — C3490 Malignant neoplasm of unspecified part of unspecified bronchus or lung: Secondary | ICD-10-CM

## 2011-12-27 DIAGNOSIS — Z5111 Encounter for antineoplastic chemotherapy: Secondary | ICD-10-CM

## 2011-12-27 LAB — CBC WITH DIFFERENTIAL/PLATELET
BASO%: 3.3 % — ABNORMAL HIGH (ref 0.0–2.0)
Basophils Absolute: 0.1 10*3/uL (ref 0.0–0.1)
EOS%: 0.7 % (ref 0.0–7.0)
HCT: 33.4 % — ABNORMAL LOW (ref 34.8–46.6)
HGB: 11.5 g/dL — ABNORMAL LOW (ref 11.6–15.9)
LYMPH%: 34.7 % (ref 14.0–49.7)
MCH: 31.2 pg (ref 25.1–34.0)
MCHC: 34.4 g/dL (ref 31.5–36.0)
NEUT%: 49.9 % (ref 38.4–76.8)
Platelets: 273 10*3/uL (ref 145–400)

## 2011-12-27 LAB — COMPREHENSIVE METABOLIC PANEL
AST: 40 U/L — ABNORMAL HIGH (ref 0–37)
Albumin: 4.1 g/dL (ref 3.5–5.2)
Alkaline Phosphatase: 59 U/L (ref 39–117)
BUN: 7 mg/dL (ref 6–23)
Calcium: 8.7 mg/dL (ref 8.4–10.5)
Chloride: 101 mEq/L (ref 96–112)
Creatinine, Ser: 0.53 mg/dL (ref 0.50–1.10)
Glucose, Bld: 282 mg/dL — ABNORMAL HIGH (ref 70–99)
Potassium: 4.1 mEq/L (ref 3.5–5.3)

## 2011-12-27 MED ORDER — ONDANSETRON 8 MG/50ML IVPB (CHCC)
8.0000 mg | Freq: Once | INTRAVENOUS | Status: AC
  Administered 2011-12-27: 8 mg via INTRAVENOUS

## 2011-12-27 MED ORDER — SODIUM CHLORIDE 0.9 % IV SOLN
1000.0000 mg/m2 | Freq: Once | INTRAVENOUS | Status: AC
  Administered 2011-12-27: 2052 mg via INTRAVENOUS
  Filled 2011-12-27: qty 54

## 2011-12-27 MED ORDER — SODIUM CHLORIDE 0.9 % IV SOLN
Freq: Once | INTRAVENOUS | Status: AC
  Administered 2011-12-27: 15:00:00 via INTRAVENOUS

## 2011-12-27 MED ORDER — DEXAMETHASONE SODIUM PHOSPHATE 10 MG/ML IJ SOLN
10.0000 mg | Freq: Once | INTRAMUSCULAR | Status: AC
  Administered 2011-12-27: 10 mg via INTRAVENOUS

## 2011-12-27 NOTE — Patient Instructions (Signed)
Call if any problems

## 2012-01-03 ENCOUNTER — Other Ambulatory Visit: Payer: Self-pay | Admitting: Lab

## 2012-01-03 ENCOUNTER — Other Ambulatory Visit: Payer: Self-pay | Admitting: *Deleted

## 2012-01-03 ENCOUNTER — Other Ambulatory Visit (HOSPITAL_COMMUNITY): Payer: Self-pay

## 2012-01-04 ENCOUNTER — Telehealth: Payer: Self-pay | Admitting: Internal Medicine

## 2012-01-04 NOTE — Telephone Encounter (Signed)
pt aware that her 3/18 appt has been moved to 3/20  aom

## 2012-01-05 ENCOUNTER — Other Ambulatory Visit (HOSPITAL_BASED_OUTPATIENT_CLINIC_OR_DEPARTMENT_OTHER): Payer: Self-pay | Admitting: Lab

## 2012-01-05 ENCOUNTER — Ambulatory Visit (HOSPITAL_COMMUNITY)
Admission: RE | Admit: 2012-01-05 | Discharge: 2012-01-05 | Disposition: A | Discharge status: Home / Self Care | Payer: Self-pay | Source: Ambulatory Visit | Attending: Physician Assistant | Admitting: Physician Assistant

## 2012-01-05 DIAGNOSIS — I2510 Atherosclerotic heart disease of native coronary artery without angina pectoris: Secondary | ICD-10-CM | POA: Insufficient documentation

## 2012-01-05 DIAGNOSIS — E278 Other specified disorders of adrenal gland: Secondary | ICD-10-CM | POA: Insufficient documentation

## 2012-01-05 DIAGNOSIS — C3490 Malignant neoplasm of unspecified part of unspecified bronchus or lung: Secondary | ICD-10-CM | POA: Insufficient documentation

## 2012-01-05 DIAGNOSIS — K7689 Other specified diseases of liver: Secondary | ICD-10-CM | POA: Insufficient documentation

## 2012-01-05 LAB — CBC WITH DIFFERENTIAL/PLATELET
Basophils Absolute: 0 10*3/uL (ref 0.0–0.1)
EOS%: 2.1 % (ref 0.0–7.0)
Eosinophils Absolute: 0.1 10*3/uL (ref 0.0–0.5)
HGB: 11.5 g/dL — ABNORMAL LOW (ref 11.6–15.9)
LYMPH%: 15.3 % (ref 14.0–49.7)
MCH: 30.8 pg (ref 25.1–34.0)
MCV: 90.3 fL (ref 79.5–101.0)
MONO%: 11.8 % (ref 0.0–14.0)
NEUT#: 4 10*3/uL (ref 1.5–6.5)
NEUT%: 70.3 % (ref 38.4–76.8)
Platelets: 69 10*3/uL — ABNORMAL LOW (ref 145–400)
RDW: 17.3 % — ABNORMAL HIGH (ref 11.2–14.5)

## 2012-01-05 LAB — COMPREHENSIVE METABOLIC PANEL
AST: 37 U/L (ref 0–37)
Albumin: 3.8 g/dL (ref 3.5–5.2)
Alkaline Phosphatase: 64 U/L (ref 39–117)
BUN: 7 mg/dL (ref 6–23)
Creatinine, Ser: 0.65 mg/dL (ref 0.50–1.10)
Glucose, Bld: 268 mg/dL — ABNORMAL HIGH (ref 70–99)
Total Bilirubin: 0.3 mg/dL (ref 0.3–1.2)

## 2012-01-06 ENCOUNTER — Ambulatory Visit: Payer: Self-pay | Admitting: Internal Medicine

## 2012-01-06 ENCOUNTER — Telehealth: Payer: Self-pay | Admitting: Internal Medicine

## 2012-01-06 VITALS — BP 134/68 | HR 109 | Temp 97.0°F | Ht 68.0 in | Wt 186.2 lb

## 2012-01-06 DIAGNOSIS — C3490 Malignant neoplasm of unspecified part of unspecified bronchus or lung: Secondary | ICD-10-CM

## 2012-01-06 NOTE — Telephone Encounter (Signed)
gv pt appt schedule for June including ct for 6/17.

## 2012-01-07 NOTE — Progress Notes (Signed)
[facility] [phone]   [phone]  OFFICE PROGRESS NOTE  Janice Skeans, MD, MD [address] [name] [address]  CC: Kalman Shan M.D., Ines Bloomer M.D., Oneita Hurt M.D., Dineen Kid Reche Dixon M.D.   DIAGNOSIS: Recurrent non-small cell lung cancer, squamous cell carcinoma initially diagnosis stage IIIa (T1 N2 MX ) in August of 2010.   PRIOR THERAPY:  #1 status post concurrent chemoradiation with weekly carboplatin and paclitaxel, last dose of chemotherapy given 08/05/2011.  #2 status post consolidation chemotherapy with carboplatin paclitaxel last dose given 10/27/2009.  #3 Concurrent chemoradiation with weekly carboplatin for an AUC of 2 and paclitaxel at 45 mg per meter squared given concurrent with radiotherapy, last dose was given 09/20/2011.  #4 Systemic chemotherapy with gemcitabine 1000 mg meter squared on days 1 and 8 every 3 weeks status post 3 cycles with stable disease.  CURRENT THERAPY: None.   INTERVAL HISTORY: Janice Ball 56 y.o. female returns to the clinic today for followup visit. She tolerated velocity cycles of her systemic chemotherapy with single agent gemcitabine fairly well except for generalized fatigue and weakness. She denied having any significant nausea or vomiting. She has no fever or chills. The patient denied having any significant chest pain or shortness of breath, cough or hemoptysis. She has repeat CT scan of the chest performed recently and she is here today for evaluation and discussion of her scan results.  MEDICAL HISTORY: Past Medical History  Diagnosis Date  . Diabetes mellitus   . Anxiety   . Hyperlipidemia   . Hypothyroidism   . COPD (chronic obstructive pulmonary disease)   . Lung cancer     lung ca dx 8/10  . Arthritis     thumbs    ALLERGIES:  is allergic to sulfonamide derivatives; codeine; and penicillins.  MEDICATIONS:  Current Outpatient Prescriptions  Medication  Sig Dispense Refill  . albuterol (VENTOLIN HFA) 108 (90 BASE) MCG/ACT inhaler Inhale 2 puffs into the lungs every 6 (six) hours as needed.        . Alum & Mag Hydroxide-Simeth (MAGIC MOUTHWASH) SOLN Take 5 mLs by mouth 4 (four) times daily.  240 mL  0  . aspirin 325 MG tablet Take 325 mg by mouth daily.        . budesonide-formoterol (SYMBICORT) 160-4.5 MCG/ACT inhaler Inhale 2 puffs into the lungs 2 (two) times daily.  1 Inhaler  1  . Chromium 1000 MCG TABS Take 1 tablet by mouth daily.        . diclofenac-misoprostol (ARTHROTEC 75) 75-0.2 MG per tablet Take 1 tablet by mouth 2 (two) times daily.        . fenofibrate (TRICOR) 145 MG tablet Take 145 mg by mouth daily.        . fluconazole (DIFLUCAN) 100 MG tablet Take 1 tablet (100 mg total) by mouth daily. Take 2 stat, then 1 po daily until completed  16 tablet  0  . Guaifenesin (MUCINEX MAXIMUM STRENGTH) 1200 MG TB12 Take 1 tablet by mouth every 12 (twelve) hours as needed.        Marland Kitchen HYDROcodone-acetaminophen (NORCO) 5-325 MG per tablet Take 1 tablet by mouth every 6 (six) hours as needed. Written as vicodin 5mg  1 tab q 4-6hprn cough or pain po,dispence 25, no refill       . levothyroxine (SYNTHROID, LEVOTHROID) 125 MCG tablet Take 125 mcg by mouth daily.        Marland Kitchen LORazepam (  ATIVAN) 1 MG tablet Take 1 tablet (1 mg total) by mouth every 6 (six) hours as needed.  30 tablet  0  . magnesium gluconate (MAGONATE) 500 MG tablet Take 1,000 mg by mouth daily.       . metFORMIN (GLUCOPHAGE) 500 MG tablet Take 500 mg by mouth 2 (two) times daily with a meal.        . montelukast (SINGULAIR) 10 MG tablet Take 10 mg by mouth at bedtime.        . Multiple Vitamins-Minerals (CENTRUM SILVER PO) Take 1 tablet by mouth daily.        . niacin (NIASPAN) 1000 MG CR tablet Take 1,000 mg by mouth at bedtime.        . sertraline (ZOLOFT) 100 MG tablet Take 100 mg by mouth daily. Takes 1 1/2 tabs daily for total dose of 150 mg      . spironolactone (ALDACTONE) 25 MG  tablet Take 25 mg by mouth daily.        . travoprost, benzalkonium, (TRAVATAN) 0.004 % ophthalmic solution Place 1 drop into both eyes at bedtime.        . vitamin B-12 (CYANOCOBALAMIN) 500 MCG tablet Take 500 mcg by mouth daily.          SURGICAL HISTORY:  Past Surgical History  Procedure Date  . Total abdominal hysterectomy   . Thyroidectomy, partial   . Tonsillectomy   . Tubal ligation     REVIEW OF SYSTEMS:  A comprehensive review of systems was negative except for: Constitutional: positive for fatigue   PHYSICAL EXAMINATION: General appearance: alert, cooperative and no distress Neck: no adenopathy Lymph nodes: Cervical, supraclavicular, and axillary nodes normal. Resp: clear to auscultation bilaterally Cardio: regular rate and rhythm, S1, S2 normal, no murmur, click, rub or gallop GI: soft, non-tender; bowel sounds normal; no masses,  no organomegaly Extremities: extremities normal, atraumatic, no cyanosis or edema Neurologic: Alert and oriented X 3, normal strength and tone. Normal symmetric reflexes. Normal coordination and gait  ECOG PERFORMANCE STATUS: 1 - Symptomatic but completely ambulatory  Blood pressure 134/68, pulse 109, temperature 97 F (36.1 C), height 5\' 8"  (1.727 m), weight 186 lb 3.2 oz (84.46 kg).  LABORATORY DATA: Lab Results  Component Value Date   WBC 5.7 01/05/2012   HGB 11.5* 01/05/2012   HCT 33.7* 01/05/2012   MCV 90.3 01/05/2012   PLT 69* 01/05/2012      Chemistry      Component Value Date/Time   NA 139 01/05/2012 1131   NA 139 10/28/2011 0812   K 4.0 01/05/2012 1131   K 3.9 10/28/2011 0812   CL 106 01/05/2012 1131   CL 103 10/28/2011 0812   CO2 19 01/05/2012 1131   CO2 27 10/28/2011 0812   BUN 7 01/05/2012 1131   BUN 11 10/28/2011 0812   CREATININE 0.65 01/05/2012 1131   CREATININE 0.4* 10/28/2011 0812      Component Value Date/Time   CALCIUM 8.2* 01/05/2012 1131   CALCIUM 8.5 10/28/2011 0812   ALKPHOS 64 01/05/2012 1131   ALKPHOS 55 10/28/2011  0812   AST 37 01/05/2012 1131   AST 21 10/28/2011 0812   ALT 56* 01/05/2012 1131   BILITOT 0.3 01/05/2012 1131   BILITOT 0.40 10/28/2011 0812       RADIOGRAPHIC STUDIES: Ct Chest W Contrast  01/05/2012  *RADIOLOGY REPORT*  Clinical Data: History of lung cancer with nodal metastases. Radiation therapy completed in December 2012.  Chemotherapy ongoing.  Cough.  CT CHEST WITH CONTRAST  Technique:  Multidetector CT imaging of the chest was performed following the standard protocol during bolus administration of intravenous contrast.  Contrast:  80 ml of Omnipaque-300  Comparison: CT of thorax 10/28/2011.  Findings:  Mediastinum: Heart size is normal. There is no significant pericardial fluid, thickening or pericardial calcification. There is atherosclerosis of the thoracic aorta, the great vessels of the mediastinum and the coronary arteries, including calcified atherosclerotic plaque in the the left main, left anterior descending, left circumflex and right coronary arteries. There is a prominent low right paratracheal lymph node, however, this has decreased in size compared the prior examination and currently measures only 9 mm in short axis. No other pathologically enlarged mediastinal or hilar lymph nodes are otherwise identified. Separate origin of the left vertebral artery directly from the aortic arch (normal variant) incidentally noted.  Lungs/Pleura: There is again chronic volume loss and architectural distortion in the right suprahilar region involving predominately the right upper lobe, and to a lesser extent the superior segment of the right lower lobe.  The overall appearance is unchanged compared to the recent prior study from 10/28/2011, and is compatible with chronic postradiation changes.  There is a 5 mm pleural based nodule in the periphery of the left lower lobe (image 32 of series 5) which is completely unchanged compared to prior examination 03/28/2009, consistent with a benign lesion such as  a subpleural lymph node (this lesion requires no further imaging follow-up).  No other definite new or enlarging suspicious appearing pulmonary nodules or masses are otherwise identified.  No consolidative airspace disease.  There is a small amount of pleural retraction adjacent to the area of post radiation change in the superior segment of the right lower lobe, without its evidence of significant pleural effusion.  A small diverticulum off the posterolateral aspect of the proximal trachea on the right is incidentally noted (unchanged).  Upper Abdomen: Diffusely decreased attenuation throughout the hepatic parenchyma, compatible with hepatic steatosis.  There is a 1 cm nodule in the lateral limb of the right adrenal gland which is unchanged compared to remote prior study from 03/28/2009, and do not technically characterize is favored to represent a benign lesion such as a small adenoma.  Musculoskeletal: There are no aggressive appearing lytic or blastic lesions noted in the visualized portions of the skeleton.  IMPRESSION: 1.  Chronic postradiation changes in the suprahilar region of the right lung again noted, similar to prior studies.  No definitive evidence to suggest local recurrence of disease or new metastasis. 2. Atherosclerosis, including left main and three-vessel coronary artery disease. Please note that although the presence of coronary artery calcium documents the presence of coronary artery disease, the severity of this disease and any potential stenosis cannot be assessed on this non-gated CT examination.  Assessment for potential risk factor modification, dietary therapy or pharmacologic therapy may be warranted, if clinically indicated. 3. Hepatic steatosis. 4.  Unchanged 1 cm right adrenal nodule compared to remote prior examinations dating back to 2010.  Although not technically characterized on today's examination, the stability in size over time suggests a benign lesion such as an adenoma.   Original Report Authenticated By: Florencia Reasons, M.D.    ASSESSMENT: This is a very pleasant 56 years old white female with recurrent non-small cell lung cancer most recently treated with systemic chemotherapy with single agent gemcitabine status post 3 cycles. The patient tolerated her treatment fairly well except for the fatigue and weakness. She  has no evidence for disease progression on his recent scan.  PLAN: I discussed the scan results with the patient today. I gave him the option of taking break from chemotherapy especially with her increasing fatigue versus proceeding with the same treatment for now. After discussion of both options in details. The patient agreed to break from treatment. I will follow her closely with observation and repeat CT scan of the chest in 3 months. She was advised to call me immediately if she has any concerning symptoms in the interval.  All questions were answered. The patient knows to call the clinic with any problems, questions or concerns. We can certainly see the patient much sooner if necessary.

## 2012-01-10 ENCOUNTER — Other Ambulatory Visit: Payer: Self-pay | Admitting: Lab

## 2012-01-10 ENCOUNTER — Ambulatory Visit: Payer: Self-pay

## 2012-01-17 ENCOUNTER — Ambulatory Visit: Payer: Self-pay

## 2012-01-17 ENCOUNTER — Other Ambulatory Visit: Payer: Self-pay | Admitting: Lab

## 2012-01-20 ENCOUNTER — Telehealth: Payer: Self-pay | Admitting: Internal Medicine

## 2012-01-20 NOTE — Telephone Encounter (Signed)
lmomtcb x1--according to 06/02/11 OV pt was to f/u with MR

## 2012-01-21 NOTE — Telephone Encounter (Signed)
Per phone msg from 7/12/152, pt did switch from MW to MR.  Therefore, her appt with MR is fine.

## 2012-01-21 NOTE — Telephone Encounter (Signed)
Advised pt that the f/u w/ MR is fine.  Pt stated nothing further was needed at this time.  Janice Ball

## 2012-01-24 ENCOUNTER — Other Ambulatory Visit: Payer: Self-pay | Admitting: Lab

## 2012-01-31 ENCOUNTER — Ambulatory Visit: Payer: Self-pay

## 2012-02-07 ENCOUNTER — Ambulatory Visit: Payer: Self-pay

## 2012-02-09 ENCOUNTER — Other Ambulatory Visit: Payer: Self-pay | Admitting: Internal Medicine

## 2012-02-21 ENCOUNTER — Ambulatory Visit: Payer: Self-pay

## 2012-02-28 ENCOUNTER — Ambulatory Visit: Payer: Self-pay

## 2012-03-03 ENCOUNTER — Other Ambulatory Visit: Payer: Self-pay

## 2012-03-03 ENCOUNTER — Ambulatory Visit (INDEPENDENT_AMBULATORY_CARE_PROVIDER_SITE_OTHER): Payer: Self-pay | Admitting: Internal Medicine

## 2012-03-03 ENCOUNTER — Encounter: Payer: Self-pay | Admitting: Internal Medicine

## 2012-03-03 VITALS — BP 120/60 | HR 115 | Temp 97.1°F | Ht 68.0 in | Wt 182.2 lb

## 2012-03-03 DIAGNOSIS — D869 Sarcoidosis, unspecified: Secondary | ICD-10-CM

## 2012-03-03 DIAGNOSIS — R5383 Other fatigue: Secondary | ICD-10-CM

## 2012-03-03 DIAGNOSIS — F17200 Nicotine dependence, unspecified, uncomplicated: Secondary | ICD-10-CM

## 2012-03-03 DIAGNOSIS — R5381 Other malaise: Secondary | ICD-10-CM

## 2012-03-03 DIAGNOSIS — C3490 Malignant neoplasm of unspecified part of unspecified bronchus or lung: Secondary | ICD-10-CM

## 2012-03-03 DIAGNOSIS — J449 Chronic obstructive pulmonary disease, unspecified: Secondary | ICD-10-CM

## 2012-03-03 MED ORDER — VARENICLINE TARTRATE 0.5 MG X 11 & 1 MG X 42 PO MISC: ORAL | Status: DC

## 2012-03-03 NOTE — Progress Notes (Signed)
Subjective:    Patient ID: Janice Ball, female    DOB: 11-16-1955, 56 y.o.   MRN: [id number]  HPI 42 yowf smoker, evaluated in 7/10 by Dr Sherene Sires for gradual onset worsening tendency to sinus congestion and drainage assoc chronic cough worse in winter never goes completely away x sev years mucoid thick worst in am usually. CXR with persistent RUL opacity worrisome for post-obstructive process. Prompted FOB on 06/12/09 that showed probable bronchogenic carcinoma involving the lateral  tracheal wall and extending into the right upper lobe and partially obstructing the right upper lobe posterior segment. 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 FEV1 2.19 L/M (80%), ratio of 66. No sign. Change with SABA . DLCO 65% (04/29/2011 )  February 05, 2010 Last smoked Oct 2010. Pt states she is SOB "all the time"- unless resting. She states that she was out of breath walking from parking lot to our building today. She also c/o occ prod cough with clear sputum. She c/o no energy. Feels like on a roller coaster in terms of doe and much better sometimes to point where can chase grandchild inside and out, other times sob at rest. Freq throat clear with no excess mucus doesn't disturb sleep. Freq throat clearing.   April 01, 2011: AECOPD - Rx abx and pred (had hemoptysis)  04/29/2011 Follow up: Pt presents for a follow up. Seen 2 weeks ago for an AECOPD , tx with Doxycycline x 5 days and steroid taper. . She says she got some improvement but never totally went away and cough is returning over last week. Complains of HA, hemoptysis with bright red blood x1 2 days ago , prod cough with green mucus, wheezing x1 week.  She had a PFT today that showed preserved lung fxn -mild COPD - FEV1 2.19 L/M (80%), ratio of 66. No sign. Change with SABA . DLCO 65%. She is followed by Dr. Gwenyth Bouillon for her lung cancer. Last CT chest 02/2011 showed Similar to minimal enlargement of a right paratracheal lymph node.. No evidence of new or progressive disease within  the chest. . Scattered pulmonary nodules which are stable and nonspecific. Next follow up CT chest in 06/2011 . She does admit she restarted smoking for short time but quit 1 month ago "for good" . REC: PRED TAPER and AVELOX  OV 06/02/2011: Folloowup COPD, Lung cancer, Smoking. She is transferring care from Dr Sherene Sires to Dr. Marchelle Gearing  C/o bilateral ankle edema since 2 days. This is new. She is surprised because she is on aldactone for 5 years due to thyroid related pedal edema. Edema associated with calf ache that is "burny" bilaterally that is constant. The pain is helped with advil and elevating feet helps with edema. Pedal edema worst end of day. Currently in office she states edema is very mild but end of day severe like a "balloon". Denies recent travel, hormone replacement pills, recent surgery, recent immobilization, hemoptysis, chest pain or wheeze or fever, runny nose, syncope  [place] COPD: unchanged class 2 dyspnea and cough with mild white sputum. Compliant with symbicort and singulair. Denies current smoking.   For cancer: next ct chest planned in sept 2012 by Dr Shirline Frees.     #Leg swelling  - this is probably due to varicose veins; please talk to your PMD about this  #COPD  - this is currently stable  -continue your current medications  #Followup  - 6 months or sooner if there is copd attack  - have flu shot today or when  available  - check alpha 1 at followup  OV 03/03/2012 Followup  #COPD  (PFT July 2012: preserved lung fxn -mild COPD - FEV1 2.19 L/M (80%), ratio of 66. No sign. Change with SABA . DLCO 65%) with baseline class 2 dyspnea.  #And smoking ( reports that she has been smoking Cigarettes.  She has a 22.5 pack-year smoking history) #lung cancer - Dr Arbutus Ped  She is coming after 9 months instead of 6 months. Cancer hx is reviewed below; she is on break from chemo after CT showd no progression in march 2013. She is reporting slow worsening of fatigue, dyspnea and cough  (mostly dry but occ white mucus) since last office visit. Class 2-3 exertional and relieved by rest. Severity is mild-moderate (last time it was mild). Steadily progressive since last office visit; profile not c/w aecopd. Denies associated chest pain, hemoptysis, edema, colored sputum, fever, chills.  In terms of smoking: has relapsed. Smoking half pack per day. Wants to do chantix again. Previously took for 3 months and quit for 9 months. No side effects.  Determined to quit.    CAT COPD Symptom and Quality of Life Score ([employer])  0 (no burden) to 5 (highest burden)  Never Cough -> Cough all the time 4  No phlegm in chest -> Chest is full of phlegm 3  No chest tightness -> Chest feels very tight 2  No dyspnea for 1 flight stairs/hill -> Very dyspneic for 1 flight of stairs 4  No limitations for ADL at home -> Very limited with ADL at home 3  Confident leaving home -> Not at all confident leaving home 0  Sleep soundly -> Do not sleep soundly because of lung condition 1  Lots of Energy -> No energy at all 4  TOTAL Score (max 40)  21        PAST HX  DIAGNOSIS: Recurrent non-small cell lung cancer, squamous cell carcinoma initially diagnosis stage IIIa (T1 N2 MX ) in August of 2010.   #1 status post concurrent chemoradiation with weekly carboplatin and paclitaxel, last dose of chemotherapy given 08/05/2011.   #2 status post consolidation chemotherapy with carboplatin paclitaxel last dose given 10/27/2009.   #3 Concurrent chemoradiation with weekly carboplatin for an AUC of 2 and paclitaxel at 45 mg per meter squared given concurrent with radiotherapy, last dose was given  09/20/2011.   #4 Systemic chemotherapy with gemcitabine 1000 mg meter squared on days 1 and 8 every 3 weeks status post 3 cycles with stable disease.   CURRENT THERAPY:  March 2013 - CT chest wihtout recurrence and BREAK IN CHEMO PLANNED.   Review of Systems  Constitutional: Negative for fever and  unexpected weight change.  HENT: Positive for congestion and postnasal drip. Negative for ear pain, nosebleeds, sore throat, rhinorrhea, sneezing, trouble swallowing, dental problem and sinus pressure.   Eyes: Negative for redness and itching.  Respiratory: Positive for cough, shortness of breath and wheezing. Negative for choking and chest tightness.   Cardiovascular: Positive for chest pain. Negative for palpitations and leg swelling.  Gastrointestinal: Negative for nausea and vomiting.  Genitourinary: Negative for dysuria.  Musculoskeletal: Negative for joint swelling.  Skin: Negative for rash.  Neurological: Positive for headaches.  Hematological: Bruises/bleeds easily.  Psychiatric/Behavioral: Negative for dysphoric mood. The patient is not nervous/anxious.        Objective:   Physical Exam Vitals reviewed. Constitutional: She is oriented to person, place, and time. She appears well-developed and well-nourished. No  distress.  HENT:  Head: Normocephalic and atraumatic.  Right Ear: External ear normal.  Left Ear: External ear normal.  Mouth/Throat: Oropharynx is clear and moist. No oropharyngeal exudate.  Eyes: Conjunctivae and EOM are normal. Pupils are equal, round, and reactive to light. Right eye exhibits no discharge. Left eye exhibits no discharge. No scleral icterus.  Neck: Normal range of motion. Neck supple. No JVD present. No tracheal deviation present. No thyromegaly present.  Cardiovascular: Normal rate, regular rhythm, normal heart sounds and intact distal pulses.  Exam reveals no gallop and no friction rub.   No murmur heard. Pulmonary/Chest: Effort normal and breath sounds normal. No respiratory distress. She has no wheezes. She has no rales. She exhibits no tenderness.  Abdominal: Soft. Bowel sounds are normal. She exhibits no distension and no mass. There is no tenderness. There is no rebound and no guarding.  Musculoskeletal: Normal range of motion. She exhibits no  edema. She exhibits no tenderness.  Lymphadenopathy:    She has no cervical adenopathy.  Neurological: She is alert and oriented to person, place, and time. She has normal reflexes. No cranial nerve deficit. She exhibits normal muscle tone. Coordination normal.  Skin: Skin is warm and dry. No rash noted. She is not diaphoretic. No erythema. No pallor.  Psychiatric: She has a normal mood and affect. Her behavior is normal. Judgment and thought content normal.          Assessment & Plan:

## 2012-03-03 NOTE — Patient Instructions (Signed)
#  COPD - do CAT score today  - COPD could be getting worse due to ongoing smoking  - please have full PFT;  I will call you with results and based on this might add another medication but for now continue symbicort 2 puff twice daily - nurse will refer you to pulmonary rehab - we will let you know alpha 1 results when they return in several weeks  #Smoking  - please re-start chantix as directed (be wary of bad dreams) - glad you want to quit smoking  #Fatigue - Probably due to combination of copd, cancer, and chemo  - check vitamin D level - nurse will refer you to pulmonary rehab  #Followup  - return in 3 months

## 2012-03-04 ENCOUNTER — Encounter: Payer: Self-pay | Admitting: Internal Medicine

## 2012-03-04 ENCOUNTER — Telehealth: Payer: Self-pay | Admitting: Internal Medicine

## 2012-03-04 DIAGNOSIS — F17200 Nicotine dependence, unspecified, uncomplicated: Secondary | ICD-10-CM | POA: Insufficient documentation

## 2012-03-04 DIAGNOSIS — R5383 Other fatigue: Secondary | ICD-10-CM | POA: Insufficient documentation

## 2012-03-04 NOTE — Assessment & Plan Note (Addendum)
#  COPD - CAT Score 21 and reflects high symptom burden  - COPD could be getting worse due to ongoing smoking  - please have full PFT;  I will call you with results and based on this might add another medication but for now continue symbicort 2 puff twice daily - nurse will refer you to pulmonary rehab - alpha 1 checked today: we will let you know alpha 1 results when they return in several weeks

## 2012-03-04 NOTE — Telephone Encounter (Signed)
Noticing diagnosis lis of sarcoid on this patient and I tried to remove it because I reviewd the chart could not find sarcoid in her diagnosis list. I then tried to delete the sarcpoid dx but it said a CT order was linked to it but could not find a ct order (my instructions do not have CT order).   I think this is an error entry (? Done by one of the CMAs).   Please clarify

## 2012-03-04 NOTE — Assessment & Plan Note (Signed)
Per Dr Arbutus Ped. Taking break from chemo; no progressive disease

## 2012-03-04 NOTE — Assessment & Plan Note (Signed)
#  Fatigue - Probably due to combination of copd, cancer, and chemo  - check vitamin D level - nurse will refer you to pulmonary rehab  #Followup  - return in 3 months

## 2012-03-04 NOTE — Assessment & Plan Note (Signed)
#  Smoking  - 3 min counseling done; she wants to quit and will take chantix.   - please re-start chantix as directed (be wary of bad dreams); might end up doing 6 month befcuase last time 3 months failed - glad you want to quit smoking

## 2012-03-07 NOTE — Telephone Encounter (Signed)
Pt advised of lab results. Alsp the CT was enterd on the wrong patient but this was corrected at the time of the mistake. The ct was cancelled, this pt did not get a ct done. Carron Curie, CMA

## 2012-03-07 NOTE — Telephone Encounter (Signed)
Janice Ball did you call this pt? Pls advise thanks

## 2012-03-07 NOTE — Telephone Encounter (Signed)
Pt returned Jennifer's call & can be reached at [phone].  Pt believes this is for results.  Antionette Fairy

## 2012-03-09 ENCOUNTER — Ambulatory Visit (INDEPENDENT_AMBULATORY_CARE_PROVIDER_SITE_OTHER): Payer: Self-pay | Admitting: Internal Medicine

## 2012-03-09 DIAGNOSIS — J449 Chronic obstructive pulmonary disease, unspecified: Secondary | ICD-10-CM

## 2012-03-09 LAB — PULMONARY FUNCTION TEST

## 2012-03-09 NOTE — Progress Notes (Signed)
PFT done today. 

## 2012-03-13 ENCOUNTER — Ambulatory Visit: Payer: Self-pay

## 2012-03-14 ENCOUNTER — Telehealth: Payer: Self-pay | Admitting: Internal Medicine

## 2012-03-14 NOTE — Telephone Encounter (Signed)
Reviewed pft 03/09/12 - please tell her that is near normal and to continue symbicort and there is no need to add any other inhhaler. FU per prior OV plan   For my perusal  PFT 03/09/12 - fev1 2.2L/82%, Ratio 68, small airways 465%, TLC 89%, DLCO 59%

## 2012-03-15 NOTE — Telephone Encounter (Signed)
[email or web address] Cada, CMA  

## 2012-03-15 NOTE — Telephone Encounter (Signed)
Pt aware. Janice Ball, CMA  

## 2012-03-21 ENCOUNTER — Encounter: Payer: Self-pay | Admitting: Internal Medicine

## 2012-03-27 ENCOUNTER — Telehealth: Payer: Self-pay | Admitting: Internal Medicine

## 2012-03-27 NOTE — Telephone Encounter (Signed)
Alpha 1 03/21/12 - MM gene

## 2012-03-30 ENCOUNTER — Telehealth: Payer: Self-pay | Admitting: Internal Medicine

## 2012-03-30 NOTE — Telephone Encounter (Signed)
Alpha 1 03/03/12 - MM and normal

## 2012-03-31 ENCOUNTER — Encounter: Payer: Self-pay | Admitting: Internal Medicine

## 2012-04-03 ENCOUNTER — Inpatient Hospital Stay (HOSPITAL_COMMUNITY)
Admission: RE | Admit: 2012-04-03 | Discharge: 2012-04-03 | Payer: Self-pay | Source: Ambulatory Visit | Attending: Internal Medicine | Admitting: Internal Medicine

## 2012-04-03 ENCOUNTER — Other Ambulatory Visit (HOSPITAL_BASED_OUTPATIENT_CLINIC_OR_DEPARTMENT_OTHER): Payer: Self-pay | Admitting: Lab

## 2012-04-04 ENCOUNTER — Ambulatory Visit: Payer: Self-pay

## 2012-04-04 ENCOUNTER — Telehealth: Payer: Self-pay | Admitting: Internal Medicine

## 2012-04-04 DIAGNOSIS — C3490 Malignant neoplasm of unspecified part of unspecified bronchus or lung: Secondary | ICD-10-CM

## 2012-04-04 LAB — CBC WITH DIFFERENTIAL/PLATELET
Basophils Absolute: 0.1 10*3/uL (ref 0.0–0.1)
Eosinophils Absolute: 0.1 10*3/uL (ref 0.0–0.5)
HGB: 13.2 g/dL (ref 11.6–15.9)
LYMPH%: 17.2 % (ref 14.0–49.7)
MCV: 85.8 fL (ref 79.5–101.0)
MONO%: 7.7 % (ref 0.0–14.0)
NEUT#: 4.7 10*3/uL (ref 1.5–6.5)
NEUT%: 72.3 % (ref 38.4–76.8)
Platelets: 167 10*3/uL (ref 145–400)

## 2012-04-04 LAB — CMP (CANCER CENTER ONLY)
ALT(SGPT): 30 U/L (ref 10–47)
AST: 31 U/L (ref 11–38)
Albumin: 3.7 g/dL (ref 3.3–5.5)
Calcium: 8.4 mg/dL (ref 8.0–10.3)
Chloride: 100 mEq/L (ref 98–108)
Potassium: 4.7 mEq/L (ref 3.3–4.7)

## 2012-04-04 NOTE — Telephone Encounter (Signed)
Pt came in as she missed lab and ct 6/17.  R/s appts    aom

## 2012-04-05 ENCOUNTER — Ambulatory Visit (HOSPITAL_COMMUNITY)
Admission: RE | Admit: 2012-04-05 | Discharge: 2012-04-05 | Disposition: A | Discharge status: Home / Self Care | Payer: Self-pay | Source: Ambulatory Visit | Attending: Internal Medicine | Admitting: Internal Medicine

## 2012-04-05 DIAGNOSIS — R911 Solitary pulmonary nodule: Secondary | ICD-10-CM | POA: Insufficient documentation

## 2012-04-05 DIAGNOSIS — J8410 Pulmonary fibrosis, unspecified: Secondary | ICD-10-CM | POA: Insufficient documentation

## 2012-04-05 DIAGNOSIS — C3490 Malignant neoplasm of unspecified part of unspecified bronchus or lung: Secondary | ICD-10-CM | POA: Insufficient documentation

## 2012-04-05 DIAGNOSIS — J479 Bronchiectasis, uncomplicated: Secondary | ICD-10-CM | POA: Insufficient documentation

## 2012-04-05 DIAGNOSIS — I2510 Atherosclerotic heart disease of native coronary artery without angina pectoris: Secondary | ICD-10-CM | POA: Insufficient documentation

## 2012-04-05 MED ORDER — IOHEXOL 300 MG/ML  SOLN
80.0000 mL | Freq: Once | INTRAMUSCULAR | Status: AC | PRN
  Administered 2012-04-05: 80 mL via INTRAVENOUS

## 2012-04-06 ENCOUNTER — Ambulatory Visit (HOSPITAL_BASED_OUTPATIENT_CLINIC_OR_DEPARTMENT_OTHER): Payer: Self-pay | Admitting: Internal Medicine

## 2012-04-06 ENCOUNTER — Telehealth: Payer: Self-pay | Admitting: Internal Medicine

## 2012-04-06 VITALS — BP 128/76 | HR 101 | Temp 98.1°F | Ht 68.0 in | Wt 183.0 lb

## 2012-04-06 DIAGNOSIS — C3490 Malignant neoplasm of unspecified part of unspecified bronchus or lung: Secondary | ICD-10-CM

## 2012-04-06 NOTE — Telephone Encounter (Signed)
appts made and printed for pt aom °

## 2012-04-06 NOTE — Progress Notes (Signed)
[facility] Telephone:([phone]) [phone]   [phone]  OFFICE PROGRESS NOTE  Georganna Skeans, MD [address] [name] [address]  DIAGNOSIS: Recurrent non-small cell lung cancer, squamous cell carcinoma initially diagnosis stage IIIa (T1 N2 MX ) in August of 2010.   PRIOR THERAPY:  #1 status post concurrent chemoradiation with weekly carboplatin and paclitaxel, last dose of chemotherapy given 08/05/2011.  #2 status post consolidation chemotherapy with carboplatin paclitaxel last dose given 10/27/2009.  #3 Concurrent chemoradiation with weekly carboplatin for an AUC of 2 and paclitaxel at 45 mg per meter squared given concurrent with radiotherapy, last dose was given 09/20/2011.  #4 Systemic chemotherapy with gemcitabine 1000 mg meter squared on days 1 and 8 every 3 weeks status post 3 cycles with stable disease.   CURRENT THERAPY: None.   INTERVAL HISTORY: Janice Ball 56 y.o. female returns to the clinic today for three-month followup visit. The patient is doing fine today with no specific complaints. She denied having any significant chest pain or shortness breath, no cough or hemoptysis. No significant weight loss or night sweats. She has repeat CT scan of the chest performed recently and she is here today for evaluation and discussion of her scan results.  MEDICAL HISTORY: Past Medical History  Diagnosis Date  . Diabetes mellitus   . Anxiety   . Hyperlipidemia   . Hypothyroidism   . COPD (chronic obstructive pulmonary disease)   . Lung cancer     lung ca dx 8/10  . Arthritis     thumbs    ALLERGIES:  is allergic to sulfonamide derivatives; codeine; and penicillins.  MEDICATIONS:  Current Outpatient Prescriptions  Medication Sig Dispense Refill  . albuterol (VENTOLIN HFA) 108 (90 BASE) MCG/ACT inhaler Inhale 2 puffs into the lungs every 6 (six) hours as needed.        . Alum & Mag Hydroxide-Simeth (MAGIC MOUTHWASH) SOLN Take 5 mLs by mouth  4 (four) times daily.  240 mL  0  . aspirin 325 MG tablet Take 325 mg by mouth daily.        . budesonide-formoterol (SYMBICORT) 160-4.5 MCG/ACT inhaler Inhale 2 puffs into the lungs 2 (two) times daily.      . Chromium 1000 MCG TABS Take 1 tablet by mouth daily.        . fenofibrate (TRICOR) 145 MG tablet Take 145 mg by mouth daily.        . Guaifenesin (MUCINEX MAXIMUM STRENGTH) 1200 MG TB12 Take 1 tablet by mouth every 12 (twelve) hours as needed.        Marland Kitchen levothyroxine (SYNTHROID, LEVOTHROID) 125 MCG tablet Take 125 mcg by mouth daily.        Marland Kitchen LORazepam (ATIVAN) 1 MG tablet TAKE ONE TABLET BY MOUTH EVERY 6 HOURS AS NEEDED FOR ANXIETY OR NAUSEA  40 tablet  0  . magnesium gluconate (MAGONATE) 500 MG tablet Take 1,000 mg by mouth daily.       . metFORMIN (GLUCOPHAGE) 500 MG tablet Take 500 mg by mouth 2 (two) times daily with a meal.        . montelukast (SINGULAIR) 10 MG tablet Take 10 mg by mouth at bedtime.        . Multiple Vitamins-Minerals (CENTRUM SILVER PO) Take 1 tablet by mouth daily.        . sertraline (ZOLOFT) 100 MG tablet Take 100 mg by mouth daily. Takes 1 1/2 tabs daily for total dose of 150  mg      . travoprost, benzalkonium, (TRAVATAN) 0.004 % ophthalmic solution Place 1 drop into both eyes at bedtime.        . varenicline (CHANTIX STARTING MONTH PAK) 0.5 MG X 11 & 1 MG X 42 tablet Take one 0.5 mg tablet by mouth once daily for 3 days, then increase to one 0.5 mg tablet twice daily for 4 days, then increase to one 1 mg tablet twice daily.  53 tablet  0  . vitamin B-12 (CYANOCOBALAMIN) 500 MCG tablet Take 500 mcg by mouth daily.        Marland Kitchen DISCONTD: budesonide-formoterol (SYMBICORT) 160-4.5 MCG/ACT inhaler Inhale 2 puffs into the lungs 2 (two) times daily.  1 Inhaler  1  . diclofenac-misoprostol (ARTHROTEC 75) 75-0.2 MG per tablet Take 1 tablet by mouth 2 (two) times daily.        . fluconazole (DIFLUCAN) 100 MG tablet Take 1 tablet (100 mg total) by mouth daily. Take 2 stat, then  1 po daily until completed  16 tablet  0  . HYDROcodone-acetaminophen (NORCO) 5-325 MG per tablet Take 1 tablet by mouth every 6 (six) hours as needed. Written as vicodin 5mg  1 tab q 4-6hprn cough or pain po,dispence 25, no refill       . niacin (NIASPAN) 1000 MG CR tablet Take 1,000 mg by mouth at bedtime.        Marland Kitchen spironolactone (ALDACTONE) 25 MG tablet Take 25 mg by mouth daily.         No current facility-administered medications for this visit.   Facility-Administered Medications Ordered in Other Visits  Medication Dose Route Frequency Provider Last Rate Last Dose  . iohexol (OMNIPAQUE) 300 MG/ML solution 80 mL  80 mL Intravenous Once PRN Medication Radiologist, MD   80 mL at 04/05/12 1155    SURGICAL HISTORY:  Past Surgical History  Procedure Date  . Total abdominal hysterectomy   . Thyroidectomy, partial   . Tonsillectomy   . Tubal ligation     REVIEW OF SYSTEMS:  A comprehensive review of systems was negative.   PHYSICAL EXAMINATION: General appearance: alert, cooperative and no distress Neck: no adenopathy Lymph nodes: Cervical, supraclavicular, and axillary nodes normal. Resp: clear to auscultation bilaterally Cardio: regular rate and rhythm, S1, S2 normal, no murmur, click, rub or gallop GI: soft, non-tender; bowel sounds normal; no masses,  no organomegaly Extremities: extremities normal, atraumatic, no cyanosis or edema Neurologic: Alert and oriented X 3, normal strength and tone. Normal symmetric reflexes. Normal coordination and gait  ECOG PERFORMANCE STATUS: 0 - Asymptomatic  Blood pressure 128/76, pulse 101, temperature 98.1 F (36.7 C), temperature source Oral, height 5\' 8"  (1.727 m), weight 183 lb (83.008 kg).  LABORATORY DATA: Lab Results  Component Value Date   WBC 6.5 04/04/2012   HGB 13.2 04/04/2012   HCT 38.7 04/04/2012   MCV 85.8 04/04/2012   PLT 167 04/04/2012      Chemistry      Component Value Date/Time   NA 141 04/04/2012 0945   NA 139  01/05/2012 1131   K 4.7 04/04/2012 0945   K 4.0 01/05/2012 1131   CL 100 04/04/2012 0945   CL 106 01/05/2012 1131   CO2 29 04/04/2012 0945   CO2 19 01/05/2012 1131   BUN 16 04/04/2012 0945   BUN 7 01/05/2012 1131   CREATININE 0.9 04/04/2012 0945   CREATININE 0.65 01/05/2012 1131      Component Value Date/Time   CALCIUM 8.4 04/04/2012  0945   CALCIUM 8.2* 01/05/2012 1131   ALKPHOS 61 04/04/2012 0945   ALKPHOS 64 01/05/2012 1131   AST 31 04/04/2012 0945   AST 37 01/05/2012 1131   ALT 56* 01/05/2012 1131   BILITOT 0.50 04/04/2012 0945   BILITOT 0.3 01/05/2012 1131       RADIOGRAPHIC STUDIES: Ct Chest W Contrast  04/05/2012  *RADIOLOGY REPORT*  Clinical Data: Lung cancer restaging  CT CHEST WITH CONTRAST  Technique:  Multidetector CT imaging of the chest was performed following the standard protocol during bolus administration of intravenous contrast.  Contrast: 80mL OMNIPAQUE IOHEXOL 300 MG/ML  SOLN  Comparison: 01/05/2012  Findings:  The right lobe of the thyroid gland is not visualized and may be surgically absent.  The left lobe appears normal.  No axillary adenopathy.  Right paratracheal lymph node measures 0.7 cm, image 19.  Unchanged from the previous exam. Several calcifications are noted involving the LAD and left circumflex.  Right lung paramediastinal fibrosis and bronchiectasis is identified consistent with changes of external beam radiation. There is no suspicious pulmonary nodule or mass identified.  No pericardial or pleural effusion.  There is a tiny (4.7 mm) nodule in the left upper lobe which is unchanged from previous exam.  Review of the visualized osseous structures is significant for mild scoliosis and multilevel spondylosis.  There are no aggressive lytic or sclerotic bone lesions.  Limited imaging through the upper abdomen shows fatty infiltration of the liver.  Nodule within the right adrenal gland measures 1.2 cm, image 59.  This is unchanged from previous exam.  IMPRESSION:  1.  No  acute cardiopulmonary abnormalities. 2.  Stable CT of the chest.  No specific features identified to suggest residual or recurrent tumor. 3.  Coronary artery calcifications noted.  Original Report Authenticated By: Rosealee Albee, M.D.    ASSESSMENT: This is a very pleasant 56 years old white female with recurrent non-small cell lung cancer last treatment was with single agent gemcitabine performed 3 months ago. The patient is doing fine and she has no evidence for disease progression.  PLAN: I discussed the scan results with the patient and recommended for her continuous observation for now with repeat CT scan of the chest with contrast in 3 months. I will see her back for followup visit at that time. She was advised to call me immediately if she has any concerning symptoms in the interval.  All questions were answered. The patient knows to call the clinic with any problems, questions or concerns. We can certainly see the patient much sooner if necessary.

## 2012-04-27 ENCOUNTER — Ambulatory Visit: Payer: Self-pay | Admitting: Radiation Oncology

## 2012-05-03 ENCOUNTER — Encounter: Payer: Self-pay | Admitting: Radiation Oncology

## 2012-05-04 ENCOUNTER — Ambulatory Visit
Admission: RE | Admit: 2012-05-04 | Discharge: 2012-05-04 | Disposition: A | Discharge status: Home / Self Care | Payer: Self-pay | Source: Ambulatory Visit | Attending: Radiation Oncology | Admitting: Radiation Oncology

## 2012-05-04 DIAGNOSIS — C3490 Malignant neoplasm of unspecified part of unspecified bronchus or lung: Secondary | ICD-10-CM

## 2012-05-04 NOTE — Progress Notes (Signed)
Routine 6 month follow up ost radiation of right lung with right paratracheal lymph node involvement. Intermittent shortness of breath and occassional cough.Scrathy, raspy throat for one week.Started z-pack per PCP on Tuesday of this week. Chest ct stable from 04/05/2012.

## 2012-05-08 ENCOUNTER — Encounter: Payer: Self-pay | Admitting: Radiation Oncology

## 2012-05-08 NOTE — Progress Notes (Signed)
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  Interval Since Last Radiation:  8 months  Narrative:  The patient returns today for routine follow-up.  She's doing well. She reports some intermittent shortness of breath. She also reports occasional cough. She has had a raspy voice and scratchy throat for one week. Her primary care physician started her on a Z-Pak earlier this week for those symptoms. Her most recent staging workup was on June 19 with chest CT. This showed no evidence of active disease                               ALLERGIES:  is allergic to sulfonamide derivatives; codeine; and penicillins.  Meds: Current Outpatient Prescriptions  Medication Sig Dispense Refill  . albuterol (VENTOLIN HFA) 108 (90 BASE) MCG/ACT inhaler Inhale 2 puffs into the lungs every 6 (six) hours as needed.        . Alum & Mag Hydroxide-Simeth (MAGIC MOUTHWASH) SOLN Take 5 mLs by mouth 4 (four) times daily.  240 mL  0  . aspirin 325 MG tablet Take 325 mg by mouth daily.        . budesonide-formoterol (SYMBICORT) 160-4.5 MCG/ACT inhaler Inhale 2 puffs into the lungs 2 (two) times daily.      . Chromium 1000 MCG TABS Take 1 tablet by mouth daily.        . diclofenac-misoprostol (ARTHROTEC 75) 75-0.2 MG per tablet Take 1 tablet by mouth 2 (two) times daily.        . fenofibrate (TRICOR) 145 MG tablet Take 145 mg by mouth daily.        . fluconazole (DIFLUCAN) 100 MG tablet Take 1 tablet (100 mg total) by mouth daily. Take 2 stat, then 1 po daily until completed  16 tablet  0  . Guaifenesin (MUCINEX MAXIMUM STRENGTH) 1200 MG TB12 Take 1 tablet by mouth every 12 (twelve) hours as needed.        Marland Kitchen  HYDROcodone-acetaminophen (NORCO) 5-325 MG per tablet Take 1 tablet by mouth every 6 (six) hours as needed. Written as vicodin 5mg  1 tab q 4-6hprn cough or pain po,dispence 25, no refill       . levothyroxine (SYNTHROID, LEVOTHROID) 125 MCG tablet Take 125 mcg by mouth daily.        Marland Kitchen LORazepam (ATIVAN) 1 MG tablet TAKE ONE TABLET BY MOUTH EVERY 6 HOURS AS NEEDED FOR ANXIETY OR NAUSEA  40 tablet  0  . magnesium gluconate (MAGONATE) 500 MG tablet Take 1,000 mg by mouth daily.       . metFORMIN (GLUCOPHAGE) 500 MG tablet Take 500 mg by mouth 2 (two) times daily with a meal.        . montelukast (SINGULAIR) 10 MG tablet Take 10 mg by mouth at bedtime.        . Multiple Vitamins-Minerals (CENTRUM SILVER PO) Take 1 tablet by mouth daily.        . niacin (NIASPAN) 1000 MG CR tablet Take 1,000 mg by mouth at bedtime.        . sertraline (ZOLOFT) 100 MG tablet Take 100 mg by  mouth daily. Takes 1 1/2 tabs daily for total dose of 150 mg      . spironolactone (ALDACTONE) 25 MG tablet Take 25 mg by mouth daily.        . travoprost, benzalkonium, (TRAVATAN) 0.004 % ophthalmic solution Place 1 drop into both eyes at bedtime.        . varenicline (CHANTIX STARTING MONTH PAK) 0.5 MG X 11 & 1 MG X 42 tablet Take one 0.5 mg tablet by mouth once daily for 3 days, then increase to one 0.5 mg tablet twice daily for 4 days, then increase to one 1 mg tablet twice daily.  53 tablet  0  . vitamin B-12 (CYANOCOBALAMIN) 500 MCG tablet Take 500 mcg by mouth daily.          Physical Findings: The patient is in no acute distress. Patient is alert and oriented. Vital signs were recorded. The patient's blood pressure was 116/76 temperature is 97.9 pulse is 107 respiratory rate is 20 blood O2 saturation is 96% on room air lungs are clear throughout heart is regular supraclavicular region is free of adenopathy.  No significant changes.  Lab Findings: Lab Results  Component Value Date   WBC 6.5 04/04/2012   HGB 13.2 04/04/2012    HCT 38.7 04/04/2012   MCV 85.8 04/04/2012   PLT 167 04/04/2012   Radiographic Findings: Her last chest CT was in June and showed no active disease. Her next chest CT is in September.  Impression:  The patient is stable with no evidence of progressive disease.  Plan:  The patient will continue to follow closely with Dr. Shirline Frees. I will he happy to see her back in the radiation clinic on an as-needed basis.  _____________________________________  Artist Pais Kathrynn Running, M.D.

## 2012-05-23 ENCOUNTER — Encounter: Payer: Self-pay | Admitting: Pulmonary Disease

## 2012-05-23 ENCOUNTER — Encounter: Payer: Self-pay | Admitting: Adult Health

## 2012-05-23 ENCOUNTER — Ambulatory Visit (INDEPENDENT_AMBULATORY_CARE_PROVIDER_SITE_OTHER): Payer: Self-pay | Admitting: Adult Health

## 2012-05-23 VITALS — BP 128/72 | HR 104 | Temp 98.2°F | Ht 68.0 in | Wt 185.2 lb

## 2012-05-23 DIAGNOSIS — J441 Chronic obstructive pulmonary disease with (acute) exacerbation: Secondary | ICD-10-CM

## 2012-05-23 MED ORDER — PREDNISONE 10 MG PO TABS: ORAL_TABLET | ORAL | Status: DC

## 2012-05-23 MED ORDER — DOXYCYCLINE HYCLATE 100 MG PO TABS: 100.0000 mg | ORAL_TABLET | Freq: Two times a day (BID) | ORAL | Status: DC

## 2012-05-23 MED ORDER — HYDROCODONE-HOMATROPINE 5-1.5 MG/5ML PO SYRP: 5.0000 mL | ORAL_SOLUTION | Freq: Four times a day (QID) | ORAL | Status: AC | PRN

## 2012-05-23 MED ORDER — DOXYCYCLINE HYCLATE 100 MG PO TABS: 100.0000 mg | ORAL_TABLET | Freq: Two times a day (BID) | ORAL | Status: AC

## 2012-05-23 MED ORDER — LEVALBUTEROL HCL 0.63 MG/3ML IN NEBU
0.6300 mg | INHALATION_SOLUTION | Freq: Once | RESPIRATORY_TRACT | Status: AC
  Administered 2012-05-23: 0.63 mg via RESPIRATORY_TRACT

## 2012-05-23 NOTE — Assessment & Plan Note (Addendum)
Exacerbation  Congratulated on not smoking  xopenex neb given in office    Plan'  Doxycycline 100mg  Twice daily  For 7 days  Mucinex DM Twice daily  As needed  Cough/congestion  Fluids and rest  Prednisone taper over next week.  Please contact office for sooner follow up if symptoms do not improve or worsen or seek emergency care  follow up Dr. Marchelle Gearing as planned and As needed

## 2012-05-23 NOTE — Progress Notes (Signed)
Subjective:    Patient ID: Janice Ball, female    DOB: 11-16-1955, 56 y.o.   MRN: [id number]  HPI 42 yowf smoker, evaluated in 7/10 by Dr Sherene Sires for gradual onset worsening tendency to sinus congestion and drainage assoc chronic cough worse in winter never goes completely away x sev years mucoid thick worst in am usually. CXR with persistent RUL opacity worrisome for post-obstructive process. Prompted FOB on 06/12/09 that showed probable bronchogenic carcinoma involving the lateral  tracheal wall and extending into the right upper lobe and partially obstructing the right upper lobe posterior segment. 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 FEV1 2.19 L/M (80%), ratio of 66. No sign. Change with SABA . DLCO 65% (04/29/2011 )  February 05, 2010 Last smoked Oct 2010. Pt states she is SOB "all the time"- unless resting. She states that she was out of breath walking from parking lot to our building today. She also c/o occ prod cough with clear sputum. She c/o no energy. Feels like on a roller coaster in terms of doe and much better sometimes to point where can chase grandchild inside and out, other times sob at rest. Freq throat clear with no excess mucus doesn't disturb sleep. Freq throat clearing.   April 01, 2011: AECOPD - Rx abx and pred (had hemoptysis)  04/29/2011 Follow up: Pt presents for a follow up. Seen 2 weeks ago for an AECOPD , tx with Doxycycline x 5 days and steroid taper. . She says she got some improvement but never totally went away and cough is returning over last week. Complains of HA, hemoptysis with bright red blood x1 2 days ago , prod cough with green mucus, wheezing x1 week.  She had a PFT today that showed preserved lung fxn -mild COPD - FEV1 2.19 L/M (80%), ratio of 66. No sign. Change with SABA . DLCO 65%. She is followed by Dr. Gwenyth Bouillon for her lung cancer. Last CT chest 02/2011 showed Similar to minimal enlargement of a right paratracheal lymph node.. No evidence of new or progressive disease within  the chest. . Scattered pulmonary nodules which are stable and nonspecific. Next follow up CT chest in 06/2011 . She does admit she restarted smoking for short time but quit 1 month ago "for good" . REC: PRED TAPER and AVELOX  OV 06/02/2011: Folloowup COPD, Lung cancer, Smoking. She is transferring care from Dr Sherene Sires to Dr. Marchelle Gearing  C/o bilateral ankle edema since 2 days. This is new. She is surprised because she is on aldactone for 5 years due to thyroid related pedal edema. Edema associated with calf ache that is "burny" bilaterally that is constant. The pain is helped with advil and elevating feet helps with edema. Pedal edema worst end of day. Currently in office she states edema is very mild but end of day severe like a "balloon". Denies recent travel, hormone replacement pills, recent surgery, recent immobilization, hemoptysis, chest pain or wheeze or fever, runny nose, syncope  [place] COPD: unchanged class 2 dyspnea and cough with mild white sputum. Compliant with symbicort and singulair. Denies current smoking.   For cancer: next ct chest planned in sept 2012 by Dr Shirline Frees.     #Leg swelling  - this is probably due to varicose veins; please talk to your PMD about this  #COPD  - this is currently stable  -continue your current medications  #Followup  - 6 months or sooner if there is copd attack  - have flu shot today or when  available  - check alpha 1 at followup  OV 03/03/2012 Followup  #COPD  (PFT July 2012: preserved lung fxn -mild COPD - FEV1 2.19 L/M (80%), ratio of 66. No sign. Change with SABA . DLCO 65%) with baseline class 2 dyspnea.  #And smoking ( reports that she has been smoking Cigarettes.  She has a 22.5 pack-year smoking history) #lung cancer - Dr Arbutus Ped  She is coming after 9 months instead of 6 months. Cancer hx is reviewed below; she is on break from chemo after CT showd no progression in march 2013. She is reporting slow worsening of fatigue, dyspnea and cough  (mostly dry but occ white mucus) since last office visit. Class 2-3 exertional and relieved by rest. Severity is mild-moderate (last time it was mild). Steadily progressive since last office visit; profile not c/w aecopd. Denies associated chest pain, hemoptysis, edema, colored sputum, fever, chills.  In terms of smoking: has relapsed. Smoking half pack per day. Wants to do chantix again. Previously took for 3 months and quit for 9 months. No side effects.  Determined to quit.    CAT COPD Symptom and Quality of Life Score ([employer])  0 (no burden) to 5 (highest burden)  Never Cough -> Cough all the time 4  No phlegm in chest -> Chest is full of phlegm 3  No chest tightness -> Chest feels very tight 2  No dyspnea for 1 flight stairs/hill -> Very dyspneic for 1 flight of stairs 4  No limitations for ADL at home -> Very limited with ADL at home 3  Confident leaving home -> Not at all confident leaving home 0  Sleep soundly -> Do not sleep soundly because of lung condition 1  Lots of Energy -> No energy at all 4  TOTAL Score (max 40)  21     05/23/2012 Acute OV  Complains of DOE, hoarseness, HA, prod cough with clear mucus, PND, wheezing x2 weeks Has not smoked in 3 weeks .  OTC meds not working.  Wheezing and cough worse for last 3 days .  Cough is keeping her up at night.  No hemoptysis or edema.     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   #1 status post concurrent chemoradiation with weekly carboplatin and paclitaxel, last dose of chemotherapy given 08/05/2011.   #2 status post consolidation chemotherapy with carboplatin paclitaxel last dose given 10/27/2009.   #3 Concurrent chemoradiation with weekly carboplatin for an AUC of 2 and paclitaxel at 45 mg per meter squared given concurrent with radiotherapy, last dose was given  09/20/2011.   #4 Systemic chemotherapy  with gemcitabine 1000 mg meter squared on days 1 and 8 every 3 weeks status post 3 cycles with stable disease.   CURRENT THERAPY:  March 2013 - CT chest wihtout recurrence and BREAK IN CHEMO PLANNED.   Review of Systems  Constitutional:   No  weight loss, night sweats,  Fevers, chills,  +fatigue, or  lassitude.  HEENT:   No headaches,  Difficulty swallowing,  Tooth/dental problems, or  Sore throat,                No sneezing, itching, ear ache, + nasal congestion, post nasal drip,   CV:  No chest pain,  Orthopnea, PND, swelling in lower extremities, anasarca, dizziness, palpitations, syncope.   GI  No heartburn, indigestion, abdominal pain, nausea,  vomiting, diarrhea, change in bowel habits, loss of appetite, bloody stools.   Resp:    No coughing up of blood.   No chest wall deformity  Skin: no rash or lesions.  GU: no dysuria, change in color of urine, no urgency or frequency.  No flank pain, no hematuria   MS:  No joint pain or swelling.  No decreased range of motion.  No back pain.  Psych:  No change in mood or affect. No depression or anxiety.  No memory loss.          Objective:   Physical Exam GEN: A/Ox3; pleasant , NAD, well nourished   HEENT:  [place]/AT,  EACs-clear, TMs-wnl, NOSE-clear drainage , nontender sinus , THROAT-clear, no lesions, no postnasal drip or exudate noted.   NECK:  Supple w/ fair ROM; no JVD; normal carotid impulses w/o bruits; no thyromegaly or nodules palpated; no lymphadenopathy.  RESP  Coarse BS  w/o, wheezes/ rales/ or rhonchi.no accessory muscle use, no dullness to percussion  CARD:  RRR, no m/r/g  , no peripheral edema, pulses intact, no cyanosis or clubbing.  GI:   Soft & nt; nml bowel sounds; no organomegaly or masses detected.  Musco: Warm bil, no deformities or joint swelling noted.   Neuro: alert, no focal deficits noted.    Skin: Warm, no lesions or rashes         Assessment & Plan:

## 2012-05-23 NOTE — Patient Instructions (Addendum)
Doxycycline 100mg  Twice daily  For 7 days  Mucinex DM Twice daily  As needed  Cough/congestion  Fluids and rest  Prednisone taper over next week.  Please contact office for sooner follow up if symptoms do not improve or worsen or seek emergency care  follow up Dr. Marchelle Gearing as planned and As needed

## 2012-06-26 ENCOUNTER — Ambulatory Visit (INDEPENDENT_AMBULATORY_CARE_PROVIDER_SITE_OTHER): Payer: Self-pay | Admitting: Internal Medicine

## 2012-06-26 ENCOUNTER — Encounter: Payer: Self-pay | Admitting: Internal Medicine

## 2012-06-26 VITALS — BP 120/62 | HR 108 | Temp 98.4°F | Ht 68.0 in | Wt 189.0 lb

## 2012-06-26 DIAGNOSIS — J3489 Other specified disorders of nose and nasal sinuses: Secondary | ICD-10-CM

## 2012-06-26 DIAGNOSIS — Z23 Encounter for immunization: Secondary | ICD-10-CM

## 2012-06-26 DIAGNOSIS — F17200 Nicotine dependence, unspecified, uncomplicated: Secondary | ICD-10-CM

## 2012-06-26 DIAGNOSIS — J449 Chronic obstructive pulmonary disease, unspecified: Secondary | ICD-10-CM

## 2012-06-26 DIAGNOSIS — R490 Dysphonia: Secondary | ICD-10-CM

## 2012-06-26 MED ORDER — FLUTICASONE PROPIONATE 50 MCG/ACT NA SUSP: 2.0000 | Freq: Every day | NASAL | Status: DC

## 2012-06-26 MED ORDER — VARENICLINE TARTRATE 0.5 MG X 11 & 1 MG X 42 PO MISC: ORAL | Status: DC

## 2012-06-26 NOTE — Progress Notes (Signed)
Subjective:    Patient ID: Janice Ball, female    DOB: 1956/10/02, 56 y.o.   MRN: [id number]  HPI   # Recurrent non-small cell lung cancer, squamous cell carcinoma initially diagnosis stage IIIa (T1 N2 MX ) in August of 2010  - bronchogenic carcinoma involving the lateral tracheal wall & partially obstructing the right upper lobe posterior segment  - status post concurrent chemoradiation with weekly carboplatin and paclitaxel, last dose of chemotherapy given 08/05/2011.   - status post consolidation chemotherapy with carboplatin paclitaxel last dose given 10/27/2009.   - Concurrent chemoradiation with weekly carboplatin for an AUC of 2 and paclitaxel at 45 mg per meter squared given concurrent with radiotherapy, last dose 11/20/2010.   - Systemic chemotherapy with gemcitabine 1000 mg meter squared on days 1 and 8 every 3 weeks status post 3 cycles with stable disease.    - CURRENT THERAPY:  March 2013 - CT chest wihtout recurrence and BREAK IN CHEMO PLANNED.  #COPD - MM  -FEV1 2.19 L/M (80%), ratio of 66 with DLCO 65% in July 2012  #Recurrent AECOPD  - June 2012 - office Rx  - 05/23/2012 - office RX  #Ex-smoker  - Quit Oct 2010, Relapsed and Quit July 2012, Relapsed 2012-early 2013 - Rx chantix July 2013  #Fatigue NOS  - vit D July 2013 - rferred to [facility] rehab     OV 06/26/2012  Hoarseness x 1 month. On and off. New symptom. On MDI proventil x 8 years. and symbicort x 3 years.   No aggravating or relieving factors. On and off worse. Fluctuations on voice quality. seveity is moderate. Happening more frequently over time.  In terms of smoking: quit July 2013. Finished chantix x 3 months and finished course last week. She wants to restart. No worsening of bad dreams while on chantix  In terms of cancer: next week CT scan and then decision on chemo.   In terms of copd: dyspnea and cough all improved. CAT score down to 15 (was 21 in May 2013). Recovered from AECOPD 2-3 weeks  ago. Not had flu shot. Not sure if she is attending rehab  CAT COPD Symptom and Quality of Life Score ([employer]) 03/03/12 06/26/2012   Never Cough -> Cough all the time 4 3  No phlegm in chest -> Chest is full of phlegm 3 2  No chest tightness -> Chest feels very tight 2 1  No dyspnea for 1 flight stairs/hill -> Very dyspneic for 1 flight of stairs 4 4  No limitations for ADL at home -> Very limited with ADL at home 3 2  Confident leaving home -> Not at all confident leaving home 0 0  Sleep soundly -> Do not sleep soundly because of lung condition 1 0  Lots of Energy -> No energy at all 4 3  TOTAL Score (max 40)  21 15    Current outpatient prescriptions:albuterol (VENTOLIN HFA) 108 (90 BASE) MCG/ACT inhaler, Inhale 2 puffs into the lungs every 6 (six) hours as needed.  , Disp: , Rfl: ;  aspirin 325 MG tablet, Take 325 mg by mouth daily.  , Disp: , Rfl: ;  budesonide-formoterol (SYMBICORT) 160-4.5 MCG/ACT inhaler, Inhale 2 puffs into the lungs 2 (two) times daily., Disp: , Rfl: ;  Chromium 1000 MCG TABS, Take 1 tablet by mouth daily.  , Disp: , Rfl:  fenofibrate (TRICOR) 145 MG tablet, Take 145 mg by mouth daily.  , Disp: , Rfl: ;  Guaifenesin (MUCINEX MAXIMUM STRENGTH) 1200 MG TB12, Take 1 tablet by mouth every 12 (twelve) hours as needed.  , Disp: , Rfl: ;  levothyroxine (SYNTHROID, LEVOTHROID) 125 MCG tablet, Take 125 mcg by mouth daily.  , Disp: , Rfl: ;  LORazepam (ATIVAN) 1 MG tablet, TAKE ONE TABLET BY MOUTH EVERY 6 HOURS AS NEEDED FOR ANXIETY OR NAUSEA, Disp: 40 tablet, Rfl: 0 magnesium gluconate (MAGONATE) 500 MG tablet, Take 1,000 mg by mouth daily. , Disp: , Rfl: ;  metFORMIN (GLUCOPHAGE) 500 MG tablet, Take 500 mg by mouth 2 (two) times daily with a meal.  , Disp: , Rfl: ;  montelukast (SINGULAIR) 10 MG tablet, Take 10 mg by mouth at bedtime.  , Disp: , Rfl: ;  Multiple Vitamins-Minerals (CENTRUM SILVER PO), Take 1 tablet by mouth daily.  , Disp: , Rfl:  sertraline  (ZOLOFT) 100 MG tablet, Take 100 mg by mouth daily. Takes 1 1/2 tabs daily for total dose of 150 mg, Disp: , Rfl: ;  spironolactone (ALDACTONE) 25 MG tablet, Take 25 mg by mouth daily.  , Disp: , Rfl: ;  travoprost, benzalkonium, (TRAVATAN) 0.004 % ophthalmic solution, Place 1 drop into both eyes at bedtime.  , Disp: , Rfl: ;  vitamin B-12 (CYANOCOBALAMIN) 500 MCG tablet, Take 500 mcg by mouth daily.  , Disp: , Rfl:    Review of Systems  Constitutional: Negative.  Negative for fever, chills, diaphoresis, activity change, appetite change, fatigue and unexpected weight change.  HENT: Positive for congestion. Negative for hearing loss, ear pain, nosebleeds, sore throat, facial swelling, rhinorrhea, sneezing, mouth sores, trouble swallowing, neck pain, neck stiffness, dental problem, voice change, postnasal drip, sinus pressure, tinnitus and ear discharge.   Eyes: Negative.  Negative for photophobia, discharge, itching and visual disturbance.  Respiratory: Positive for cough, shortness of breath and wheezing. Negative for apnea, choking, chest tightness and stridor.   Cardiovascular: Negative for chest pain, palpitations and leg swelling.  Gastrointestinal: Negative for nausea, vomiting, abdominal pain, constipation, blood in stool and abdominal distention.       Abdominal discomfort---makes breathing difficult  Genitourinary: Negative.  Negative for dysuria, urgency, frequency, hematuria, flank pain, decreased urine volume and difficulty urinating.  Musculoskeletal: Negative.  Negative for myalgias, back pain, joint swelling, arthralgias and gait problem.  Skin: Negative.  Negative for color change, pallor and rash.  Neurological: Positive for headaches. Negative for dizziness, tremors, seizures, syncope, speech difficulty, weakness, light-headedness and numbness.  Hematological: Negative for adenopathy. Does not bruise/bleed easily.  Psychiatric/Behavioral: Negative.  Negative for confusion, disturbed  wake/sleep cycle and agitation. The patient is not nervous/anxious.        Objective:   Physical Exam  GEN: A/Ox3; pleasant , NAD, well nourished   HEENT:  [place]/AT,  EACs-clear, TMs-wnl, NOSE-clear drainage , nontender sinus , THROAT-clear, no lesions, no postnasal drip or exudate noted.   NECK:  Supple w/ fair ROM; no JVD; normal carotid impulses w/o bruits; no thyromegaly or nodules palpated; no lymphadenopathy.  RESP  Coarse BS  w/o, wheezes/ rales/ or rhonchi.no accessory muscle use, no dullness to percussion  CARD:  RRR, no m/r/g  , no peripheral edema, pulses intact, no cyanosis or clubbing.  GI:   Soft & nt; nml bowel sounds; no organomegaly or masses detected.  Musco: Warm bil, no deformities or joint swelling noted.   Neuro: alert, no focal deficits noted.    Skin: Warm, no lesions or rashes       Assessment & Plan:

## 2012-06-26 NOTE — Patient Instructions (Addendum)
#  COPD  - continue your inhalers   - have flu shot today  #Smoking   - glad you quit  - take another 3 months script for chantix  #Hoarseness of voice  -  take generic fluticasone inhaler 2 squirts each nostril daily - if this does not work, we can look at changing symbicort  Vs ENT referral  #followup  3 mnths or sooner if neeed CAT score at followup Call in 1 month with how your voice issues are doing

## 2012-06-27 DIAGNOSIS — R490 Dysphonia: Secondary | ICD-10-CM | POA: Insufficient documentation

## 2012-06-27 NOTE — Assessment & Plan Note (Signed)
#  Hoarseness of voice  -  take generic fluticasone inhaler 2 squirts each nostril daily - if this does not work, we can look at changing symbicort  Vs ENT referral  #followup  3 mnths or sooner if neeed CAT score at followup Call in 1 month with how your voice issues are doing

## 2012-06-27 NOTE — Assessment & Plan Note (Signed)
#  Smoking   - glad you quit but having cravings after running out of chantix last week  - take another 3 months script for chantix   3 min counseling

## 2012-06-27 NOTE — Assessment & Plan Note (Signed)
#  COPD  - continue your inhalers ; disesae is stable. CAT score improvd to 15  - have flu shot today

## 2012-07-06 ENCOUNTER — Other Ambulatory Visit (HOSPITAL_BASED_OUTPATIENT_CLINIC_OR_DEPARTMENT_OTHER): Payer: Self-pay | Admitting: Lab

## 2012-07-06 ENCOUNTER — Ambulatory Visit (HOSPITAL_COMMUNITY)
Admission: RE | Admit: 2012-07-06 | Discharge: 2012-07-06 | Disposition: A | Discharge status: Home / Self Care | Payer: Self-pay | Source: Ambulatory Visit | Attending: Internal Medicine | Admitting: Internal Medicine

## 2012-07-06 DIAGNOSIS — Z9221 Personal history of antineoplastic chemotherapy: Secondary | ICD-10-CM | POA: Insufficient documentation

## 2012-07-06 DIAGNOSIS — Z923 Personal history of irradiation: Secondary | ICD-10-CM | POA: Insufficient documentation

## 2012-07-06 DIAGNOSIS — D7389 Other diseases of spleen: Secondary | ICD-10-CM | POA: Insufficient documentation

## 2012-07-06 DIAGNOSIS — J398 Other specified diseases of upper respiratory tract: Secondary | ICD-10-CM | POA: Insufficient documentation

## 2012-07-06 DIAGNOSIS — I700 Atherosclerosis of aorta: Secondary | ICD-10-CM | POA: Insufficient documentation

## 2012-07-06 DIAGNOSIS — C3490 Malignant neoplasm of unspecified part of unspecified bronchus or lung: Secondary | ICD-10-CM

## 2012-07-06 DIAGNOSIS — R918 Other nonspecific abnormal finding of lung field: Secondary | ICD-10-CM | POA: Insufficient documentation

## 2012-07-06 DIAGNOSIS — I2510 Atherosclerotic heart disease of native coronary artery without angina pectoris: Secondary | ICD-10-CM | POA: Insufficient documentation

## 2012-07-06 DIAGNOSIS — J988 Other specified respiratory disorders: Secondary | ICD-10-CM | POA: Insufficient documentation

## 2012-07-06 LAB — COMPREHENSIVE METABOLIC PANEL (CC13)
AST: 22 U/L (ref 5–34)
Albumin: 3.9 g/dL (ref 3.5–5.0)
Alkaline Phosphatase: 66 U/L (ref 40–150)
BUN: 11 mg/dL (ref 7.0–26.0)
Potassium: 4.2 mEq/L (ref 3.5–5.1)
Sodium: 138 mEq/L (ref 136–145)
Total Bilirubin: 0.3 mg/dL (ref 0.20–1.20)
Total Protein: 7.3 g/dL (ref 6.4–8.3)

## 2012-07-06 LAB — CBC WITH DIFFERENTIAL/PLATELET
EOS%: 8.4 % — ABNORMAL HIGH (ref 0.0–7.0)
MCH: 30.1 pg (ref 25.1–34.0)
MCV: 87.9 fL (ref 79.5–101.0)
MONO%: 7.9 % (ref 0.0–14.0)
RBC: 4.6 10*6/uL (ref 3.70–5.45)
RDW: 13.7 % (ref 11.2–14.5)

## 2012-07-06 MED ORDER — IOHEXOL 300 MG/ML  SOLN
80.0000 mL | Freq: Once | INTRAMUSCULAR | Status: AC | PRN
  Administered 2012-07-06: 80 mL via INTRAVENOUS

## 2012-07-10 ENCOUNTER — Telehealth: Payer: Self-pay | Admitting: Internal Medicine

## 2012-07-10 ENCOUNTER — Ambulatory Visit (HOSPITAL_BASED_OUTPATIENT_CLINIC_OR_DEPARTMENT_OTHER): Payer: Self-pay | Admitting: Internal Medicine

## 2012-07-10 VITALS — BP 127/75 | HR 106 | Temp 97.2°F | Resp 20 | Ht 68.0 in | Wt 185.1 lb

## 2012-07-10 DIAGNOSIS — C3410 Malignant neoplasm of upper lobe, unspecified bronchus or lung: Secondary | ICD-10-CM

## 2012-07-10 DIAGNOSIS — F411 Generalized anxiety disorder: Secondary | ICD-10-CM

## 2012-07-10 DIAGNOSIS — C3490 Malignant neoplasm of unspecified part of unspecified bronchus or lung: Secondary | ICD-10-CM

## 2012-07-10 DIAGNOSIS — E119 Type 2 diabetes mellitus without complications: Secondary | ICD-10-CM

## 2012-07-10 NOTE — Telephone Encounter (Signed)
appts made and printed for  Pt aom °

## 2012-07-10 NOTE — Progress Notes (Signed)
[facility] [phone]   [phone]  OFFICE PROGRESS NOTE  Georganna Skeans, MD [name] [address]  DIAGNOSIS: Recurrent non-small cell lung cancer, squamous cell carcinoma initially diagnosis stage IIIa (T1 N2 MX ) in August of 2010.   PRIOR THERAPY:  #1 status post concurrent chemoradiation with weekly carboplatin and paclitaxel, last dose of chemotherapy given 08/05/2011.  #2 status post consolidation chemotherapy with carboplatin paclitaxel last dose given 10/27/2009.  #3 Concurrent chemoradiation with weekly carboplatin for an AUC of 2 and paclitaxel at 45 mg per meter squared given concurrent with radiotherapy, last dose was given 09/20/2011.  #4 Systemic chemotherapy with gemcitabine 1000 mg meter squared on days 1 and 8 every 3 weeks status post 3 cycles with stable disease, last dose was given 12/20/2011.   CURRENT THERAPY: None.  INTERVAL HISTORY: Janice Ball 56 y.o. female returns to the clinic today for routine three-month followup visit. The patient is doing fine today with no specific complaints. She denied having any significant chest pain, shortness breath, cough or hemoptysis. She has no significant weight loss or night sweats. She continues to have mild nausea but her diabetes is uncontrolled. The patient is currently switching primary care physicians after the [facility] was closed. She has repeat CT scan of the chest performed recently and she is here today for evaluation and discussion of her scan results.  MEDICAL HISTORY: Past Medical History  Diagnosis Date  . Diabetes mellitus   . Anxiety   . Hyperlipidemia   . Hypothyroidism   . COPD (chronic obstructive pulmonary disease)   . Lung cancer     lung ca dx 8/10  . Arthritis     thumbs  . Radiation 06/30/09-08/15/09    squamous cell lung    ALLERGIES:  is allergic to sulfonamide derivatives; codeine; and penicillins.  MEDICATIONS:  Current  Outpatient Prescriptions  Medication Sig Dispense Refill  . albuterol (VENTOLIN HFA) 108 (90 BASE) MCG/ACT inhaler Inhale 2 puffs into the lungs every 6 (six) hours as needed.        Marland Kitchen aspirin 325 MG tablet Take 325 mg by mouth daily.        . budesonide-formoterol (SYMBICORT) 160-4.5 MCG/ACT inhaler Inhale 2 puffs into the lungs 2 (two) times daily.      . Chromium 1000 MCG TABS Take 1 tablet by mouth daily.        . fenofibrate (TRICOR) 145 MG tablet Take 145 mg by mouth daily.        . fluticasone (FLONASE) 50 MCG/ACT nasal spray Place 2 sprays into the nose daily.  16 g  2  . Guaifenesin (MUCINEX MAXIMUM STRENGTH) 1200 MG TB12 Take 1 tablet by mouth every 12 (twelve) hours as needed.        Marland Kitchen levothyroxine (SYNTHROID, LEVOTHROID) 125 MCG tablet Take 125 mcg by mouth daily.        Marland Kitchen LORazepam (ATIVAN) 1 MG tablet TAKE ONE TABLET BY MOUTH EVERY 6 HOURS AS NEEDED FOR ANXIETY OR NAUSEA  40 tablet  0  . magnesium gluconate (MAGONATE) 500 MG tablet Take 1,000 mg by mouth daily.       . metFORMIN (GLUCOPHAGE) 500 MG tablet Take 500 mg by mouth 2 (two) times daily with a meal.        . montelukast (SINGULAIR) 10 MG tablet Take 10 mg by mouth at bedtime.        . Multiple Vitamins-Minerals (CENTRUM SILVER  PO) Take 1 tablet by mouth daily.        . sertraline (ZOLOFT) 100 MG tablet Take 100 mg by mouth daily. Takes 1 1/2 tabs daily for total dose of 150 mg      . spironolactone (ALDACTONE) 25 MG tablet Take 25 mg by mouth daily.        . travoprost, benzalkonium, (TRAVATAN) 0.004 % ophthalmic solution Place 1 drop into both eyes at bedtime.        . varenicline (CHANTIX PAK) 0.5 MG X 11 & 1 MG X 42 tablet Take one 0.5 mg tablet by mouth once daily for 3 days, then increase to one 0.5 mg tablet twice daily for 4 days, then increase to one 1 mg tablet twice daily.  53 tablet  3  . vitamin B-12 (CYANOCOBALAMIN) 500 MCG tablet Take 500 mcg by mouth daily.        . varenicline (CHANTIX STARTING MONTH PAK)  0.5 MG X 11 & 1 MG X 42 tablet Take one 0.5 mg tablet by mouth once daily for 3 days, then increase to one 0.5 mg tablet twice daily for 4 days, then increase to one 1 mg tablet twice daily.  53 tablet  0    SURGICAL HISTORY:  Past Surgical History  Procedure Date  . Total abdominal hysterectomy   . Thyroidectomy, partial   . Tonsillectomy   . Tubal ligation     REVIEW OF SYSTEMS:  A comprehensive review of systems was negative.   PHYSICAL EXAMINATION: General appearance: alert, cooperative and no distress Head: Normocephalic, without obvious abnormality, atraumatic Neck: no adenopathy Resp: clear to auscultation bilaterally Cardio: regular rate and rhythm, S1, S2 normal, no murmur, click, rub or gallop GI: soft, non-tender; bowel sounds normal; no masses,  no organomegaly Extremities: extremities normal, atraumatic, no cyanosis or edema Neurologic: Alert and oriented X 3, normal strength and tone. Normal symmetric reflexes. Normal coordination and gait  ECOG PERFORMANCE STATUS: 0 - Asymptomatic  Blood pressure 127/75, pulse 106, temperature 97.2 F (36.2 C), temperature source Oral, resp. rate 20, height 5\' 8"  (1.727 m), weight 185 lb 1.6 oz (83.961 kg).  LABORATORY DATA: Lab Results  Component Value Date   WBC 6.5 07/06/2012   HGB 13.8 07/06/2012   HCT 40.4 07/06/2012   MCV 87.9 07/06/2012   PLT 168 07/06/2012      Chemistry      Component Value Date/Time   NA 138 07/06/2012 0855   NA 141 04/04/2012 0945   NA 139 01/05/2012 1131   K 4.2 07/06/2012 0855   K 4.7 04/04/2012 0945   K 4.0 01/05/2012 1131   CL 101 07/06/2012 0855   CL 100 04/04/2012 0945   CL 106 01/05/2012 1131   CO2 24 07/06/2012 0855   CO2 29 04/04/2012 0945   CO2 19 01/05/2012 1131   BUN 11.0 07/06/2012 0855   BUN 16 04/04/2012 0945   BUN 7 01/05/2012 1131   CREATININE 0.9 07/06/2012 0855   CREATININE 0.9 04/04/2012 0945   CREATININE 0.65 01/05/2012 1131      Component Value Date/Time   CALCIUM 9.3 07/06/2012  0855   CALCIUM 8.4 04/04/2012 0945   CALCIUM 8.2* 01/05/2012 1131   ALKPHOS 66 07/06/2012 0855   ALKPHOS 61 04/04/2012 0945   ALKPHOS 64 01/05/2012 1131   AST 22 07/06/2012 0855   AST 31 04/04/2012 0945   AST 37 01/05/2012 1131   ALT 33 07/06/2012 0855   ALT 56* 01/05/2012 1131  BILITOT 0.30 07/06/2012 0855   BILITOT 0.50 04/04/2012 0945   BILITOT 0.3 01/05/2012 1131       RADIOGRAPHIC STUDIES: Ct Chest W Contrast  07/06/2012  *RADIOLOGY REPORT*  Clinical Data: History of lung cancer diagnosed in 2010 status post chemotherapy and radiation therapy.  Restaging scan.  CT CHEST WITH CONTRAST  Technique:  Multidetector CT imaging of the chest was performed following the standard protocol during bolus administration of intravenous contrast.  Contrast: 80mL OMNIPAQUE IOHEXOL 300 MG/ML  SOLN  Comparison: Chest CT 04/05/2012.  Findings:  Mediastinum: Heart size is normal. Small amount of anterior pericardial fluid and/or thickening, unlikely to be of any hemodynamic significance at this time.  No associated pericardial calcification. There is atherosclerosis of the thoracic aorta, the great vessels of the mediastinum and the coronary arteries, including calcified atherosclerotic plaque in the left main and left anterior descending coronary arteries. Borderline enlarged right paratracheal lymph node is unchanged measuring 9 mm in short axis.  Ill-defined soft tissue in the right hilar region is similar to prior examination.  No definite pathologically enlarged mediastinal or hilar lymph nodes are noted on today's examination. There is some soft tissue thickening along the posterior wall of the trachea adjacent to the esophagus, and the esophagus appears slightly retracted to the right in this region (best demonstrated on images 20 - 24 of series 2), which likely represent evolving postradiation scarring (this is similar to prior examination 04/05/2012).  The esophagus is otherwise unremarkable in appearance.  A small  diverticulum from the posterolateral aspect of the proximal trachea on the right side is unchanged.  Lungs/Pleura: As with the prior examination, there are postradiation changes, chronic volume loss and architectural distortion and such that the right upper lobe is partially completely chronically collapsed.  Some postradiation changes are also noted in the adjacent portions of the superior segment of the right lower lobe and upper portion of the right middle lobe. There is compensatory hyperexpansion of the right middle lobe with elevation of the minor fissure.  Again noted are multiple tiny pulmonary nodules scattered throughout the periphery of the upper left lung, the largest of which measures 5 mm in the periphery of the left upper lobe (image 19 of series 5).  These are unchanged, and nonspecific, favored to represent areas of mucoid impaction within terminal bronchioles.  No other larger more suspicious appearing pulmonary nodules or masses are otherwise noted.  Upper Abdomen: Diffusely decreased attenuation throughout the hepatic parenchyma suggestive of hepatic steatosis.  Small calcification in the spleen, likely a granuloma.  Atherosclerosis.  Musculoskeletal: There are no aggressive appearing lytic or blastic lesions noted in the visualized portions of the skeleton.  IMPRESSION: 1.  Postradiation changes in the right lung redemonstrated, as above, without evidence to suggest local recurrence of disease or new metastatic disease within the thorax. 2.  Multiple tiny nodules in the periphery of the left lung are similar to prior examinations and are favored to represent areas of mucoid impaction within terminal bronchioles. 3. Atherosclerosis, including left main and left anterior descending coronary artery disease.  Assessment for potential risk factor modification, dietary therapy or pharmacologic therapy may be warranted, if clinically indicated. 4.  Probable hepatic steatosis. 5.  Additional incidental  findings, as above.   Original Report Authenticated By: Florencia Reasons, M.D.     ASSESSMENT: This is a very pleasant 56 years old white female with recurrent non-small cell lung cancer treated lastly with single agent gemcitabine completed in March of  2013  PLAN: I discussed the scan results with the patient. I recommended for her to continue on observation with repeat CT scan of the chest in 3 months.  She was advised to call immediately if she has any concerning symptoms in the interval.  All questions were answered. The patient knows to call the clinic with any problems, questions or concerns. We can certainly see the patient much sooner if necessary.

## 2012-07-10 NOTE — Patient Instructions (Signed)
Your scan showed no evidence for disease progression. Followup in 3 months with repeat CT scan of the chest. 

## 2012-07-14 ENCOUNTER — Emergency Department (HOSPITAL_COMMUNITY)
Admission: EM | Admit: 2012-07-14 | Discharge: 2012-07-14 | Disposition: A | Discharge status: Home / Self Care | Payer: Self-pay | Attending: Emergency Medicine | Admitting: Emergency Medicine

## 2012-07-14 ENCOUNTER — Encounter (HOSPITAL_COMMUNITY): Payer: Self-pay | Admitting: *Deleted

## 2012-07-14 DIAGNOSIS — Z85118 Personal history of other malignant neoplasm of bronchus and lung: Secondary | ICD-10-CM | POA: Insufficient documentation

## 2012-07-14 DIAGNOSIS — Z87891 Personal history of nicotine dependence: Secondary | ICD-10-CM | POA: Insufficient documentation

## 2012-07-14 DIAGNOSIS — J4489 Other specified chronic obstructive pulmonary disease: Secondary | ICD-10-CM | POA: Insufficient documentation

## 2012-07-14 DIAGNOSIS — M129 Arthropathy, unspecified: Secondary | ICD-10-CM | POA: Insufficient documentation

## 2012-07-14 DIAGNOSIS — J449 Chronic obstructive pulmonary disease, unspecified: Secondary | ICD-10-CM | POA: Insufficient documentation

## 2012-07-14 DIAGNOSIS — E785 Hyperlipidemia, unspecified: Secondary | ICD-10-CM | POA: Insufficient documentation

## 2012-07-14 DIAGNOSIS — E119 Type 2 diabetes mellitus without complications: Secondary | ICD-10-CM | POA: Insufficient documentation

## 2012-07-14 DIAGNOSIS — E039 Hypothyroidism, unspecified: Secondary | ICD-10-CM | POA: Insufficient documentation

## 2012-07-14 DIAGNOSIS — R739 Hyperglycemia, unspecified: Secondary | ICD-10-CM

## 2012-07-14 LAB — URINALYSIS, ROUTINE W REFLEX MICROSCOPIC
Glucose, UA: 500 mg/dL — AB
Hgb urine dipstick: NEGATIVE
Ketones, ur: 15 mg/dL — AB
Leukocytes, UA: NEGATIVE
Nitrite: NEGATIVE
Protein, ur: NEGATIVE mg/dL
Specific Gravity, Urine: 1.037 — ABNORMAL HIGH (ref 1.005–1.030)
Urobilinogen, UA: 0.2 mg/dL (ref 0.0–1.0)
pH: 6.5 (ref 5.0–8.0)

## 2012-07-14 LAB — GLUCOSE, CAPILLARY: Glucose-Capillary: 201 mg/dL — ABNORMAL HIGH (ref 70–99)

## 2012-07-14 LAB — POCT I-STAT, CHEM 8
BUN: 5 mg/dL — ABNORMAL LOW (ref 6–23)
Calcium, Ion: 1.15 mmol/L (ref 1.12–1.23)
Chloride: 102 meq/L (ref 96–112)
Creatinine, Ser: 0.9 mg/dL (ref 0.50–1.10)
Glucose, Bld: 340 mg/dL — ABNORMAL HIGH (ref 70–99)
HCT: 41 % (ref 36.0–46.0)
Hemoglobin: 13.9 g/dL (ref 12.0–15.0)
Potassium: 4 meq/L (ref 3.5–5.1)
Sodium: 136 mEq/L (ref 135–145)
TCO2: 23 mmol/L (ref 0–100)

## 2012-07-14 LAB — CBC
HCT: 39.4 % (ref 36.0–46.0)
Hemoglobin: 13.6 g/dL (ref 12.0–15.0)
MCH: 30 pg (ref 26.0–34.0)
MCHC: 34.5 g/dL (ref 30.0–36.0)
MCV: 87 fL (ref 78.0–100.0)
Platelets: 201 10*3/uL (ref 150–400)
RBC: 4.53 MIL/uL (ref 3.87–5.11)
RDW: 13.7 % (ref 11.5–15.5)
WBC: 12.1 K/uL — ABNORMAL HIGH (ref 4.0–10.5)

## 2012-07-14 MED ORDER — INSULIN ASPART 100 UNIT/ML IV SOLN
10.0000 [IU] | Freq: Once | INTRAVENOUS | Status: DC
  Filled 2012-07-14: qty 0.1

## 2012-07-14 MED ORDER — SODIUM CHLORIDE 0.9 % IV BOLUS (SEPSIS)
1000.0000 mL | Freq: Once | INTRAVENOUS | Status: AC
  Administered 2012-07-14: 1000 mL via INTRAVENOUS

## 2012-07-14 MED ORDER — METFORMIN HCL 500 MG PO TABS: ORAL_TABLET | ORAL | Status: DC

## 2012-07-14 NOTE — ED Notes (Signed)
Pt CBG 201

## 2012-07-14 NOTE — ED Notes (Signed)
I-stat Chem 8  Na        135 K          4.0 Cl         102 ICa        1.15 TCO2    23  Glu       340 BUN      5 Creat     0.9 Hct        41% Hgb       13.9  Anion Gap 15

## 2012-07-14 NOTE — ED Notes (Signed)
NAD noted at time of d/c home 

## 2012-07-14 NOTE — ED Notes (Signed)
pcp told pt. On Monday that a1c. Current cbg 327. Been having h/a, nausea. Frequent urination.

## 2012-07-14 NOTE — ED Provider Notes (Signed)
History     CSN: [id number]  Arrival date & time 07/14/12  4098   First MD Initiated Contact with Patient 07/14/12 1242      Chief Complaint  Patient presents with  . Hyperglycemia    (Consider location/radiation/quality/duration/timing/severity/associated sxs/prior treatment) HPI Comments: Janice Ball 56 y.o. female   The chief complaint is: Patient presents with:   Hyperglycemia  Past Medical History:   Diabetes mellitus                                            Anxiety                                                      Hyperlipidemia                                               Hypothyroidism                                               COPD (chronic obstructive pulmonary disease)                 Lung cancer                                                    Comment:lung ca dx 8/10   Arthritis                                                      Comment:thumbs   Radiation                                       06/30/09-1*     Comment:squamous cell lung   Lung cancer                                                  55 [facility] is here today because of elevated blood sugar for the past week. She states that she has been taking her medications as directed. Blood sugar today. On arrival was 327. Patient is status post chemotherapy for lung cancer. She is followed by Dr. Shirline Frees at [facility] in oncology. She states that she had been putting her diabetes care to the back. Due to her cancer diagnosis. She states she has had some mild headaches, and a little bit of nausea, but denies any abdominal pain, diarrhea, or vomiting. She denies  any urinary symptoms, except for excessive urination. She denies any fevers, chills, arthralgias, or myalgias. She denies any upper respiratory symptoms or signs of upper respiratory infection. Denies CP or shortness of breath.  +poyluria, polydipsia.  Denies AMS.    The history is provided by  the patient.    Past Medical History  Diagnosis Date  . Diabetes mellitus   . Anxiety   . Hyperlipidemia   . Hypothyroidism   . COPD (chronic obstructive pulmonary disease)   . Lung cancer     lung ca dx 8/10  . Arthritis     thumbs  . 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 Lung cancer     Past Surgical History  Procedure Date  . Total abdominal hysterectomy   . Thyroidectomy, partial   . Tonsillectomy   . Tubal ligation     Family History  Problem Relation Age of Onset  . Colon cancer Mother   . Cancer Mother     colon    History  Substance Use Topics  . Smoking status: Former Smoker -- 1.0 packs/day for 45 years    Types: Cigarettes    Quit date: 05/01/2012  . Smokeless tobacco: Never Used  . Alcohol Use: No    OB History    Grav Para Term Preterm Abortions TAB SAB Ect Mult Living                  Review of Systems  Constitutional: Negative for fever and chills.  HENT: Negative for trouble swallowing, neck pain and sinus pressure.   Eyes: Negative for visual disturbance.  Respiratory: Negative for cough, chest tightness, shortness of breath and wheezing.   Cardiovascular: Negative for chest pain and palpitations.  Gastrointestinal: Positive for nausea. Negative for vomiting, abdominal pain and diarrhea.  Genitourinary: Positive for frequency. Negative for dysuria, hematuria and flank pain.  Musculoskeletal: Negative for myalgias, back pain and arthralgias.  Skin: Negative for rash.  Neurological: Positive for headaches. Negative for weakness and numbness.  Psychiatric/Behavioral: Negative for behavioral problems.  All other systems reviewed and are negative.    Allergies  Sulfonamide derivatives; Codeine; and Penicillins  Home Medications   Current Outpatient Rx  Name Route Sig Dispense Refill  . ALBUTEROL SULFATE HFA 108 (90 BASE) MCG/ACT IN AERS Inhalation Inhale 2 puffs into the lungs every 6 (six) hours as needed.      .  ASPIRIN 325 MG PO TABS Oral Take 325 mg by mouth daily.      . BUDESONIDE-FORMOTEROL FUMARATE 160-4.5 MCG/ACT IN AERO Inhalation Inhale 2 puffs into the lungs 2 (two) times daily.    . CHROMIUM 1000 MCG PO TABS Oral Take 1 tablet by mouth daily.      . FENOFIBRATE 145 MG PO TABS Oral Take 145 mg by mouth daily.      Marland Kitchen FLUTICASONE PROPIONATE 50 MCG/ACT NA SUSP Nasal Place 2 sprays into the nose daily. 16 g 2  . GUAIFENESIN ER 1200 MG PO TB12 Oral Take 1 tablet by mouth every 12 (twelve) hours as needed.     Marland Kitchen LEVOTHYROXINE SODIUM 125 MCG PO TABS Oral Take 125 mcg by mouth daily.      Marland Kitchen LORAZEPAM 1 MG PO TABS  TAKE ONE TABLET BY MOUTH EVERY 6 HOURS AS NEEDED FOR ANXIETY OR NAUSEA 40 tablet 0  . MAGNESIUM GLUCONATE 500 MG PO TABS Oral Take 1,000 mg by mouth daily.     Marland Kitchen METFORMIN HCL 500 MG  PO TABS Oral Take 500 mg by mouth 2 (two) times daily with a meal.      . MONTELUKAST SODIUM 10 MG PO TABS Oral Take 10 mg by mouth at bedtime.      . CENTRUM SILVER PO Oral Take 1 tablet by mouth daily.      Marland Kitchen OVER THE COUNTER MEDICATION Oral Take 2 capsules by mouth 3 (three) times daily with meals. Glucocil    . PROCHLORPERAZINE MALEATE 10 MG PO TABS Oral Take 10 mg by mouth every 6 (six) hours as needed. As needed for nausea.    . SERTRALINE HCL 100 MG PO TABS Oral Take 100 mg by mouth daily. Takes 1 1/2 tabs daily for total dose of 150 mg    . SPIRONOLACTONE 25 MG PO TABS Oral Take 25 mg by mouth daily.    . TRAVOPROST 0.004 % OP SOLN Both Eyes Place 1 drop into both eyes at bedtime.      Marland Kitchen VARENICLINE TARTRATE 0.5 MG X 11 & 1 MG X 42 PO MISC  Take one 0.5 mg tablet by mouth once daily for 3 days, then increase to one 0.5 mg tablet twice daily for 4 days, then increase to one 1 mg tablet twice daily. 53 tablet 3  . VITAMIN B-12 500 MCG PO TABS Oral Take 500 mcg by mouth daily.      Marland Kitchen PROCHLORPERAZINE MALEATE 10 MG PO TABS Oral Take 1 tablet (10 mg total) by mouth every 6 (six) hours as needed. 30 tablet 1  .  VARENICLINE TARTRATE 0.5 MG X 11 & 1 MG X 42 PO MISC  Take one 0.5 mg tablet by mouth once daily for 3 days, then increase to one 0.5 mg tablet twice daily for 4 days, then increase to one 1 mg tablet twice daily. 53 tablet 0    BP 117/56  Pulse 107  Temp 98.2 F (36.8 C)  Resp 12  SpO2 96%  Physical Exam  Nursing note and vitals reviewed. Constitutional: She is oriented to person, place, and time. She appears well-developed and well-nourished. No distress.  HENT:  Head: Normocephalic and atraumatic.  Eyes: Conjunctivae normal are normal. No scleral icterus.  Neck: Normal range of motion.  Cardiovascular: Normal rate, regular rhythm and normal heart sounds.  Exam reveals no gallop and no friction rub.   No murmur heard. Pulmonary/Chest: Effort normal and breath sounds normal. No respiratory distress. She has no wheezes.  Abdominal: Soft. Bowel sounds are normal. She exhibits no distension and no mass. There is no tenderness. There is no guarding.  Musculoskeletal: Normal range of motion. She exhibits no edema and no tenderness.  Neurological: She is alert and oriented to person, place, and time. No cranial nerve deficit.  Skin: Skin is warm and dry. She is not diaphoretic.    ED Course  Procedures (including critical care time)   Results for orders placed during the hospital encounter of 07/14/12  CBC      Component Value Range   WBC 12.1 (*) 4.0 - 10.5 K/uL   RBC 4.53  3.87 - 5.11 MIL/uL   Hemoglobin 13.6  12.0 - 15.0 g/dL   HCT 09.8  11.9 - 14.7 %   MCV 87.0  78.0 - 100.0 fL   MCH 30.0  26.0 - 34.0 pg   MCHC 34.5  30.0 - 36.0 g/dL   RDW 82.9  56.2 - 13.0 %   Platelets 201  150 - 400 K/uL  URINALYSIS,  ROUTINE W REFLEX MICROSCOPIC      Component Value Range   Color, Urine AMBER (*) YELLOW   APPearance CLEAR  CLEAR   Specific Gravity, Urine 1.037 (*) 1.005 - 1.030   pH 6.5  5.0 - 8.0   Glucose, UA 500 (*) NEGATIVE mg/dL   Hgb urine dipstick NEGATIVE  NEGATIVE    Bilirubin Urine SMALL (*) NEGATIVE   Ketones, ur 15 (*) NEGATIVE mg/dL   Protein, ur NEGATIVE  NEGATIVE mg/dL   Urobilinogen, UA 0.2  0.0 - 1.0 mg/dL   Nitrite NEGATIVE  NEGATIVE   Leukocytes, UA NEGATIVE  NEGATIVE  POCT I-STAT, CHEM 8      Component Value Range   Sodium 136  135 - 145 mEq/L   Potassium 4.0  3.5 - 5.1 mEq/L   Chloride 102  96 - 112 mEq/L   BUN 5 (*) 6 - 23 mg/dL   Creatinine, Ser 9.52  0.50 - 1.10 mg/dL   Glucose, Bld 841 (*) 70 - 99 mg/dL   Calcium, Ion 3.24  4.01 - 1.23 mmol/L   TCO2 23  0 - 100 mmol/L   Hemoglobin 13.9  12.0 - 15.0 g/dL   HCT 02.7  25.3 - 66.4 %  GLUCOSE, CAPILLARY      Component Value Range   Glucose-Capillary 201 (*) 70 - 99 mg/dL    No results found.   1. Hyperglycemia without ketosis      Lab results. Negative for acute abnormality except for glucose, urea. She has no anion gap. Patient has slight leukocytosis at 12.1. Giving her a liter of fluid bolus and 10 units of insulin. We'll recheck her blood sugar in approximately 30 minutes. Patient is doing well and is without complaint other than feeling hungry.  MDM  Health serve patient here today. Due to increasingly high blood sugars.  NO AMS or signs of DKA. She has been taking her metformin as prescribed. She has one refill left on her prescription. She denies any symptoms of brewing infection. Discussed the case with Dr. Oletta Lamas agreed that we may provide extra dose of metformin to the patient.. She which occurred sugars daily she may return to the emergency department if she needs medication. Dose adjustment. Patient is actively working on obtaining outpatient primary care.CBG down from 327 to 201 fluids and 10 units of insulin. patient feels well and is ready for discharge.  BP 120/75  Pulse 102  Temp 98.2 F (36.8 C) (Oral)  Resp 16  SpO2 96%           Arthor Captain, PA-C 07/17/12 0902  Arthor Captain, PA-C 07/17/12 [phone]

## 2012-07-14 NOTE — ED Notes (Signed)
Patient presents to ED stating that her blood sugar at home has been elevated in 300-400 range. Pt reports that she takes oral meds at home for her diabetes.

## 2012-07-20 NOTE — ED Provider Notes (Signed)
Medical screening examination/treatment/procedure(s) were performed by non-physician practitioner and as supervising physician I was immediately available for consultation/collaboration.   Janice Ball. Oletta Lamas, MD 07/20/12 [phone]

## 2012-09-18 ENCOUNTER — Ambulatory Visit (INDEPENDENT_AMBULATORY_CARE_PROVIDER_SITE_OTHER): Payer: Self-pay | Admitting: Adult Health

## 2012-09-18 ENCOUNTER — Encounter: Payer: Self-pay | Admitting: Adult Health

## 2012-09-18 VITALS — BP 108/60 | HR 101 | Temp 97.6°F | Ht 68.0 in | Wt 182.8 lb

## 2012-09-18 DIAGNOSIS — J441 Chronic obstructive pulmonary disease with (acute) exacerbation: Secondary | ICD-10-CM

## 2012-09-18 MED ORDER — PREDNISONE 10 MG PO TABS: ORAL_TABLET | ORAL | Status: DC

## 2012-09-18 MED ORDER — LEVOFLOXACIN 500 MG PO TABS: 500.0000 mg | ORAL_TABLET | Freq: Every day | ORAL | Status: DC

## 2012-09-18 MED ORDER — LEVALBUTEROL HCL 0.63 MG/3ML IN NEBU
0.6300 mg | INHALATION_SOLUTION | Freq: Once | RESPIRATORY_TRACT | Status: AC
  Administered 2012-09-18: 0.63 mg via RESPIRATORY_TRACT

## 2012-09-18 MED ORDER — FLUCONAZOLE 150 MG PO TABS: 150.0000 mg | ORAL_TABLET | Freq: Once | ORAL | Status: DC

## 2012-09-18 NOTE — Assessment & Plan Note (Signed)
Slow to resolve flare with sinusitis  Requests diflucan due to recurrent yeast infection on abx.   Plan  Levaquin 500mg  daily for  10 days  Mucinex DM Twice daily  As needed  Cough/congestion  Fluids and rest  Prednisone taper over next week.  NO SMOKING  Please contact office for sooner follow up if symptoms do not improve or worsen or seek emergency care  Diflucan 150mg  x 1 tab.  follow up Dr. Marchelle Gearing in 3 months

## 2012-09-18 NOTE — Patient Instructions (Addendum)
Levaquin 500mg  daily for  10 days  Mucinex DM Twice daily  As needed  Cough/congestion  Fluids and rest  Prednisone taper over next week.  NO SMOKING  Please contact office for sooner follow up if symptoms do not improve or worsen or seek emergency care  Diflucan 150mg  x 1 tab.  follow up Dr. Marchelle Gearing in 3 months

## 2012-09-18 NOTE — Progress Notes (Signed)
Subjective:    Patient ID: Janice Ball, female    DOB: [birth year], 56 y.o.   MRN: [id number]  HPI 17 yowf smoker, evaluated in 7/10 by Dr Sherene Sires for gradual onset worsening tendency to sinus congestion and drainage assoc chronic cough worse in winter never goes completely away x sev years mucoid thick worst in am usually. CXR with persistent RUL opacity worrisome for post-obstructive process. Prompted FOB on 06/12/09 that showed probable bronchogenic carcinoma involving the lateral  tracheal wall and extending into the right upper lobe and partially obstructing the right upper lobe posterior segment. 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 FEV1 2.19 L/M (80%), ratio of 66. No sign. Change with SABA . DLCO 65% (04/29/2011 )  February 05, 2010 Last smoked Oct 2010. Pt states she is SOB "all the time"- unless resting. She states that she was out of breath walking from parking lot to our building today. She also c/o occ prod cough with clear sputum. She c/o no energy. Feels like on a roller coaster in terms of doe and much better sometimes to point where can chase grandchild inside and out, other times sob at rest. Freq throat clear with no excess mucus doesn't disturb sleep. Freq throat clearing.   April 01, 2011: AECOPD - Rx abx and pred (had hemoptysis)  04/29/2011 Follow up: Pt presents for a follow up. Seen 2 weeks ago for an AECOPD , tx with Doxycycline x 5 days and steroid taper. . She says she got some improvement but never totally went away and cough is returning over last week. Complains of HA, hemoptysis with bright red blood x1 2 days ago , prod cough with green mucus, wheezing x1 week.  She had a PFT today that showed preserved lung fxn -mild COPD - FEV1 2.19 L/M (80%), ratio of 66. No sign. Change with SABA . DLCO 65%. She is followed by Dr. Gwenyth Bouillon for her lung cancer. Last CT chest 02/2011 showed Similar to minimal enlargement of a right paratracheal lymph node.. No evidence of new or progressive disease within  the chest. . Scattered pulmonary nodules which are stable and nonspecific. Next follow up CT chest in 06/2011 . She does admit she restarted smoking for short time but quit 1 month ago "for good" . REC: PRED TAPER and AVELOX  OV 06/02/2011: Folloowup COPD, Lung cancer, Smoking. She is transferring care from Dr Sherene Sires to Dr. Marchelle Gearing  C/o bilateral ankle edema since 2 days. This is new. She is surprised because she is on aldactone for 5 years due to thyroid related pedal edema. Edema associated with calf ache that is "burny" bilaterally that is constant. The pain is helped with advil and elevating feet helps with edema. Pedal edema worst end of day. Currently in office she states edema is very mild but end of day severe like a "balloon". Denies recent travel, hormone replacement pills, recent surgery, recent immobilization, hemoptysis, chest pain or wheeze or fever, runny nose, syncope  [place] COPD: unchanged class 2 dyspnea and cough with mild white sputum. Compliant with symbicort and singulair. Denies current smoking.   For cancer: next ct chest planned in sept 2012 by Dr Shirline Frees.     #Leg swelling  - this is probably due to varicose veins; please talk to your PMD about this  #COPD  - this is currently stable  -continue your current medications  #Followup  - 6 months or sooner if there is copd attack  - have flu shot today or when  available  - check alpha 1 at followup  OV 03/03/2012 Followup  #COPD  (PFT July 2012: preserved lung fxn -mild COPD - FEV1 2.19 L/M (80%), ratio of 66. No sign. Change with SABA . DLCO 65%) with baseline class 2 dyspnea.  #And smoking ( reports that she has been smoking Cigarettes.  She has a 22.5 pack-year smoking history) #lung cancer - Dr Arbutus Ped  She is coming after 9 months instead of 6 months. Cancer hx is reviewed below; she is on break from chemo after CT showd no progression in march 2013. She is reporting slow worsening of fatigue, dyspnea and cough  (mostly dry but occ white mucus) since last office visit. Class 2-3 exertional and relieved by rest. Severity is mild-moderate (last time it was mild). Steadily progressive since last office visit; profile not c/w aecopd. Denies associated chest pain, hemoptysis, edema, colored sputum, fever, chills.  In terms of smoking: has relapsed. Smoking half pack per day. Wants to do chantix again. Previously took for 3 months and quit for 9 months. No side effects.  Determined to quit.    CAT COPD Symptom and Quality of Life Score ([employer])  0 (no burden) to 5 (highest burden)  Never Cough -> Cough all the time 4  No phlegm in chest -> Chest is full of phlegm 3  No chest tightness -> Chest feels very tight 2  No dyspnea for 1 flight stairs/hill -> Very dyspneic for 1 flight of stairs 4  No limitations for ADL at home -> Very limited with ADL at home 3  Confident leaving home -> Not at all confident leaving home 0  Sleep soundly -> Do not sleep soundly because of lung condition 1  Lots of Energy -> No energy at all 4  TOTAL Score (max 40)  21     05/23/2012 Acute OV  Complains of DOE, hoarseness, HA, prod cough with clear mucus, PND, wheezing x2 weeks Has not smoked in 3 weeks .  OTC meds not working.  Wheezing and cough worse for last 3 days .  Cough is keeping her up at night.  No hemoptysis or edema.  >>doxy/pred taper   06/26/12  Hoarseness x 1 month. On and off. New symptom. On MDI proventil x 8 years. and symbicort x 3 years.   No aggravating or relieving factors. On and off worse. Fluctuations on voice quality. seveity is moderate. Happening more frequently over time.  In terms of smoking: quit July 2013. Finished chantix x 3 months and finished course last week. She wants to restart. No worsening of bad dreams while on chantix  In terms of cancer: next week CT scan and then decision on chemo.   In terms of copd: dyspnea and cough all improved. CAT score down to 15 (was  21 in May 2013). Recovered from AECOPD 2-3 weeks ago. Not had flu shot. Not sure if she is attending rehab  CAT COPD Symptom and Quality of Life Score ([employer] trademark) 03/03/12 06/26/2012   Never Cough -> Cough all the time 4 3  No phlegm in chest -> Chest is full of phlegm 3 2  No chest tightness -> Chest feels very tight 2 1  No dyspnea for 1 flight stairs/hill -> Very dyspneic for 1 flight of stairs 4 4  No limitations for ADL at home -> Very limited with ADL at home 3 2  Confident leaving home -> Not at all confident leaving home 0  0  Sleep soundly -> Do not sleep soundly because of lung condition 1 0  Lots of Energy -> No energy at all 4 3  TOTAL Score (max 40)  21 15   >no changes   09/18/2012 Acute OV  Complains of head congestion w/ green nasal drainage, PND, hoarseness/laryngitis, some wheezing/SOB/tightness qhs x3 weeks.   Feels she has a sinus infection.  Finished emycin and pred taper by PCP. Seen PCP 2 weeks ago.  Helped some but did not resolve totally.l No chest pain or edema.  No rash.  Wants a diflucan in case she gets a yeast infection .        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   #1 status post concurrent chemoradiation with weekly carboplatin and paclitaxel, last dose of chemotherapy given 08/05/2011.   #2 status post consolidation chemotherapy with carboplatin paclitaxel last dose given 10/27/2009.   #3 Concurrent chemoradiation with weekly carboplatin for an AUC of 2 and paclitaxel at 45 mg per meter squared given concurrent with radiotherapy, last dose was given  09/20/2011.   #4 Systemic chemotherapy with gemcitabine 1000 mg meter squared on days 1 and 8 every 3 weeks status post 3 cycles with stable disease.   CURRENT THERAPY:  March 2013 - CT chest wihtout recurrence and BREAK IN CHEMO PLANNED.   Review of Systems  Constitutional:   No   weight loss, night sweats,  Fevers, chills,  +fatigue, or  lassitude.  HEENT:   No headaches,  Difficulty swallowing,  Tooth/dental problems, or  Sore throat,                No sneezing, itching, ear ache, + nasal congestion, post nasal drip,   CV:  No chest pain,  Orthopnea, PND, swelling in lower extremities, anasarca, dizziness, palpitations, syncope.   GI  No heartburn, indigestion, abdominal pain, nausea, vomiting, diarrhea, change in bowel habits, loss of appetite, bloody stools.   Resp:    No coughing up of blood.   No chest wall deformity  Skin: no rash or lesions.  GU: no dysuria, change in color of urine, no urgency or frequency.  No flank pain, no hematuria   MS:  No joint pain or swelling.  No decreased range of motion.  No back pain.  Psych:  No change in mood or affect. No depression or anxiety.  No memory loss.          Objective:   Physical Exam GEN: A/Ox3; pleasant , NAD  HEENT:  [place]/AT,  EACs-clear, TMs-wnl, NOSE-clear drainage , nontender sinus , THROAT-clear, no lesions, no postnasal drip or exudate noted.   NECK:  Supple w/ fair ROM; no JVD; normal carotid impulses w/o bruits; no thyromegaly or nodules palpated; no lymphadenopathy.  RESP  Few scattered rhonchi and exp wheeze , no accessory muscle use, no dullness to percussion  CARD:  RRR, no m/r/g  , no peripheral edema, pulses intact, no cyanosis or clubbing.  GI:   Soft & nt; nml bowel sounds; no organomegaly or masses detected.  Musco: Warm bil, no deformities or joint swelling noted.   Neuro: alert, no focal deficits noted.    Skin: Warm, no lesions or rashes         Assessment & Plan:

## 2012-09-18 NOTE — Addendum Note (Signed)
Addended by: Boone Master E on: 09/18/2012 05:06 PM   Modules accepted: Orders

## 2012-10-02 ENCOUNTER — Ambulatory Visit (HOSPITAL_COMMUNITY)
Admission: RE | Admit: 2012-10-02 | Discharge: 2012-10-02 | Disposition: A | Discharge status: Home / Self Care | Payer: Self-pay | Source: Ambulatory Visit | Attending: Internal Medicine | Admitting: Internal Medicine

## 2012-10-02 ENCOUNTER — Other Ambulatory Visit (HOSPITAL_BASED_OUTPATIENT_CLINIC_OR_DEPARTMENT_OTHER): Payer: Self-pay | Admitting: Lab

## 2012-10-02 DIAGNOSIS — K7689 Other specified diseases of liver: Secondary | ICD-10-CM | POA: Insufficient documentation

## 2012-10-02 DIAGNOSIS — C3490 Malignant neoplasm of unspecified part of unspecified bronchus or lung: Secondary | ICD-10-CM | POA: Insufficient documentation

## 2012-10-02 DIAGNOSIS — R911 Solitary pulmonary nodule: Secondary | ICD-10-CM | POA: Insufficient documentation

## 2012-10-02 DIAGNOSIS — C3410 Malignant neoplasm of upper lobe, unspecified bronchus or lung: Secondary | ICD-10-CM

## 2012-10-02 LAB — CBC WITH DIFFERENTIAL/PLATELET
BASO%: 1.5 % (ref 0.0–2.0)
EOS%: 3.2 % (ref 0.0–7.0)
HCT: 39.2 % (ref 34.8–46.6)
MCH: 29.2 pg (ref 25.1–34.0)
MCHC: 33.9 g/dL (ref 31.5–36.0)
MONO#: 0.4 10*3/uL (ref 0.1–0.9)
NEUT%: 69.9 % (ref 38.4–76.8)
RBC: 4.56 10*6/uL (ref 3.70–5.45)
RDW: 14.1 % (ref 11.2–14.5)
WBC: 6 10*3/uL (ref 3.9–10.3)
lymph#: 1.2 10*3/uL (ref 0.9–3.3)
nRBC: 0 % (ref 0–0)

## 2012-10-02 MED ORDER — IOHEXOL 300 MG/ML  SOLN
80.0000 mL | Freq: Once | INTRAMUSCULAR | Status: AC | PRN
  Administered 2012-10-02: 80 mL via INTRAVENOUS

## 2012-10-04 ENCOUNTER — Telehealth: Payer: Self-pay | Admitting: Internal Medicine

## 2012-10-04 ENCOUNTER — Ambulatory Visit (HOSPITAL_BASED_OUTPATIENT_CLINIC_OR_DEPARTMENT_OTHER): Payer: Self-pay | Admitting: Internal Medicine

## 2012-10-04 VITALS — BP 111/66 | HR 108 | Temp 97.1°F | Resp 20 | Ht 68.0 in | Wt 181.7 lb

## 2012-10-04 DIAGNOSIS — C3410 Malignant neoplasm of upper lobe, unspecified bronchus or lung: Secondary | ICD-10-CM

## 2012-10-04 DIAGNOSIS — C3490 Malignant neoplasm of unspecified part of unspecified bronchus or lung: Secondary | ICD-10-CM

## 2012-10-04 NOTE — Patient Instructions (Addendum)
Your scan showed stable disease. Continue on observation for now with repeat CT scan of the chest in 3 months.

## 2012-10-04 NOTE — Telephone Encounter (Signed)
gv and printed pt appt schedule for March 2014...the patient aware that central scheduling will contact with d/t of ct

## 2012-10-04 NOTE — Progress Notes (Signed)
[facility] Telephone:([phone]   Fax:(336) [phone]  OFFICE PROGRESS NOTE  [name], NP [address] [name] [address]  DIAGNOSIS: Recurrent non-small cell lung cancer, squamous cell carcinoma initially diagnosis stage IIIa (T1 N2 MX ) in August of 2010.   PRIOR THERAPY:  #1 status post concurrent chemoradiation with weekly carboplatin and paclitaxel, last dose of chemotherapy given 08/05/2011.  #2 status post consolidation chemotherapy with carboplatin paclitaxel last dose given 10/27/2009.  #3 Concurrent chemoradiation with weekly carboplatin for an AUC of 2 and paclitaxel at 45 mg per meter squared given concurrent with radiotherapy, last dose was given 09/20/2011.  #4 Systemic chemotherapy with gemcitabine 1000 mg meter squared on days 1 and 8 every 3 weeks status post 3 cycles with stable disease, last dose was given 12/20/2011.   CURRENT THERAPY: None.  INTERVAL HISTORY: Janice Ball 56 y.o. female returns to the clinic today for routine three-month followup visit. The patient is feeling fine today with no specific complaints except for chest congestion and mild cough. She was treated recently with a few courses of antibiotics as well as steroids. She is followed closely by Dr. Marchelle Gearing. The patient denied having any significant chest pain but continues to have shortness breath with exertion. She continues to have cough with no hemoptysis. She denied having any significant weight loss or night sweats. She had repeat CT scan of the chest performed recently and she is here for evaluation and discussion of her scan results.  MEDICAL HISTORY: Past Medical History  Diagnosis Date  . Diabetes mellitus   . Anxiety   . Hyperlipidemia   . Hypothyroidism   . COPD (chronic obstructive pulmonary disease)   . Lung cancer     lung ca dx 8/10  . Arthritis     thumbs  . 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 Lung cancer      ALLERGIES:  is allergic to sulfonamide derivatives; codeine; and penicillins.  MEDICATIONS:  Current Outpatient Prescriptions  Medication Sig Dispense Refill  . albuterol (VENTOLIN HFA) 108 (90 BASE) MCG/ACT inhaler Inhale 2 puffs into the lungs every 6 (six) hours as needed.        Marland Kitchen aspirin 325 MG tablet Take 325 mg by mouth daily.        . budesonide-formoterol (SYMBICORT) 160-4.5 MCG/ACT inhaler Inhale 2 puffs into the lungs 2 (two) times daily.      . Chromium 1000 MCG TABS Take 1 tablet by mouth daily.        . fluticasone (FLONASE) 50 MCG/ACT nasal spray Place 2 sprays into the nose daily.  16 g  2  . Guaifenesin (MUCINEX MAXIMUM STRENGTH) 1200 MG TB12 Take 1 tablet by mouth every 12 (twelve) hours as needed.       Marland Kitchen levothyroxine (SYNTHROID, LEVOTHROID) 125 MCG tablet Take 125 mcg by mouth daily.        Marland Kitchen LORazepam (ATIVAN) 1 MG tablet TAKE ONE TABLET BY MOUTH EVERY 6 HOURS AS NEEDED FOR ANXIETY OR NAUSEA  40 tablet  0  . magnesium gluconate (MAGONATE) 500 MG tablet Take 1,000 mg by mouth daily.       . metFORMIN (GLUCOPHAGE) 500 MG tablet Take 500 mg by mouth 2 (two) times daily with a meal.        . metFORMIN (GLUCOPHAGE) 500 MG tablet Take 1 tablet at night  60 tablet  0  . montelukast (SINGULAIR) 10 MG tablet Take  10 mg by mouth at bedtime.        . Multiple Vitamins-Minerals (CENTRUM SILVER PO) Take 1 tablet by mouth daily.        Marland Kitchen OVER THE COUNTER MEDICATION Take 2 capsules by mouth 3 (three) times daily with meals. Glucocil      . sertraline (ZOLOFT) 100 MG tablet Take 100 mg by mouth daily. Takes 1 1/2 tabs daily for total dose of 150 mg      . spironolactone (ALDACTONE) 25 MG tablet Take 25 mg by mouth daily.      . travoprost, benzalkonium, (TRAVATAN) 0.004 % ophthalmic solution Place 1 drop into both eyes at bedtime.        . vitamin B-12 (CYANOCOBALAMIN) 500 MCG tablet Take 500 mcg by mouth daily.        . varenicline (CHANTIX PAK) 0.5 MG X 11 & 1 MG X 42 tablet Take  one 0.5 mg tablet by mouth once daily for 3 days, then increase to one 0.5 mg tablet twice daily for 4 days, then increase to one 1 mg tablet twice daily.  53 tablet  3    SURGICAL HISTORY:  Past Surgical History  Procedure Date  . Total abdominal hysterectomy   . Thyroidectomy, partial   . Tonsillectomy   . Tubal ligation     REVIEW OF SYSTEMS:  A comprehensive review of systems was negative except for: Respiratory: positive for cough and dyspnea on exertion   PHYSICAL EXAMINATION: General appearance: alert, cooperative and no distress Head: Normocephalic, without obvious abnormality, atraumatic Neck: no adenopathy Lymph nodes: Cervical, supraclavicular, and axillary nodes normal. Resp: clear to auscultation bilaterally Cardio: regular rate and rhythm, S1, S2 normal, no murmur, click, rub or gallop GI: soft, non-tender; bowel sounds normal; no masses,  no organomegaly Extremities: extremities normal, atraumatic, no cyanosis or edema  ECOG PERFORMANCE STATUS: 1 - Symptomatic but completely ambulatory  Blood pressure 111/66, pulse 108, temperature 97.1 F (36.2 C), temperature source Oral, resp. rate 20, height 5\' 8"  (1.727 m), weight 181 lb 11.2 oz (82.419 kg).  LABORATORY DATA: Lab Results  Component Value Date   WBC 6.0 10/02/2012   HGB 13.3 10/02/2012   HCT 39.2 10/02/2012   MCV 86.0 10/02/2012   PLT 158 10/02/2012      Chemistry      Component Value Date/Time   NA 136 07/14/2012 1048   NA 138 07/06/2012 0855   NA 141 04/04/2012 0945   K 4.0 07/14/2012 1048   K 4.2 07/06/2012 0855   K 4.7 04/04/2012 0945   CL 102 07/14/2012 1048   CL 101 07/06/2012 0855   CL 100 04/04/2012 0945   CO2 24 07/06/2012 0855   CO2 29 04/04/2012 0945   CO2 19 01/05/2012 1131   BUN 5* 07/14/2012 1048   BUN 11.0 07/06/2012 0855   BUN 16 04/04/2012 0945   CREATININE 0.90 07/14/2012 1048   CREATININE 0.9 07/06/2012 0855   CREATININE 0.9 04/04/2012 0945      Component Value Date/Time   CALCIUM 9.3  07/06/2012 0855   CALCIUM 8.4 04/04/2012 0945   CALCIUM 8.2* 01/05/2012 1131   ALKPHOS 66 07/06/2012 0855   ALKPHOS 61 04/04/2012 0945   ALKPHOS 64 01/05/2012 1131   AST 22 07/06/2012 0855   AST 31 04/04/2012 0945   AST 37 01/05/2012 1131   ALT 33 07/06/2012 0855   ALT 56* 01/05/2012 1131   BILITOT 0.30 07/06/2012 0855   BILITOT 0.50 04/04/2012 0945  BILITOT 0.3 01/05/2012 1131       RADIOGRAPHIC STUDIES: Ct Chest W Contrast  10/02/2012  *RADIOLOGY REPORT*  Clinical Data: Lung cancer restaging  CT CHEST WITH CONTRAST  Technique:  Multidetector CT imaging of the chest was performed following the standard protocol during bolus administration of intravenous contrast.  Contrast: 80mL OMNIPAQUE IOHEXOL 300 MG/ML  SOLN  Comparison: 07/06/2012  Findings:  Lungs/pleura: There is no pleural effusion identified. Paramediastinal radiation change within the right upper lobe and right mid lung is again noted and appears similar to previous exam. Patchy ground-glass attenuating nodules are identified in both lower lobes and appear new from previous exam.  These are favored to be the sequela of inflammation.  New peripheral nodule in the left upper lobe measures 4 mm, image 39.  Stable 4 mm left upper lobe nodule, image 24.  Other small left upper lobe nodules appears similar to previous exam.  Heart/Mediastinum: Heart size appears normal.  Stable 8 mm right paratracheal lymph node.  Calcifications involving the LAD coronary artery noted.  No pericardial effusion.  Upper abdomen: There is mild fatty infiltration of the liver parenchyma.  The left adrenal gland is normal.  Small nodule in the right adrenal gland measures 9 mm and is unchanged from previous study.  Bones/Musculoskeletal:  Review of the visualized bony structures is significant for mild scoliosis.  There is no worrisome lytic or sclerotic bone lesions identified.  IMPRESSION:  1.  Interval development interval development of multifocal ground- glass nodular  densities in both lung bases.  These are felt likely reflect nonspecific inflammation.  The tip of the two lung bases on follow-up imaging advised. 2.  Multiple small, solid appearing nodules in the left lung. When compared with the previous exam there is a new 4 mm solid appearing peripheral nodule. Attention to this nodule on follow-up imaging is advised.  The other nodules are stable compared with previous exam.  3.  Fatty infiltration of the liver.   Original Report Authenticated By: Signa Kell, M.D.     ASSESSMENT: This is a very pleasant 56 years old white female with recurrent non-small cell lung cancer was last treatment with single agent gemcitabine discontinued on March of 2013 with stable disease. The patient is doing fine today and she has no evidence for disease progression.  PLAN: I discussed the scan results with the patient today. I recommended for her to continue on observation for now with repeat CT scan of the chest in 3 months. For the chronic bronchitis, the patient will continue her current care with Dr. Marchelle Gearing. She was advised to call immediately if she has any concerning symptoms in the interval.  All questions were answered. The patient knows to call the clinic with any problems, questions or concerns. We can certainly see the patient much sooner if necessary.

## 2012-12-19 ENCOUNTER — Ambulatory Visit: Payer: Self-pay | Admitting: Internal Medicine

## 2013-01-03 ENCOUNTER — Other Ambulatory Visit (HOSPITAL_BASED_OUTPATIENT_CLINIC_OR_DEPARTMENT_OTHER): Payer: Self-pay | Admitting: Lab

## 2013-01-03 LAB — CBC WITH DIFFERENTIAL/PLATELET
Basophils Absolute: 0.1 10*3/uL (ref 0.0–0.1)
EOS%: 3.7 % (ref 0.0–7.0)
HCT: 40 % (ref 34.8–46.6)
HGB: 13.7 g/dL (ref 11.6–15.9)
MCH: 29.4 pg (ref 25.1–34.0)
MCHC: 34.3 g/dL (ref 31.5–36.0)
MCV: 85.6 fL (ref 79.5–101.0)
MONO%: 7.9 % (ref 0.0–14.0)
NEUT%: 61.7 % (ref 38.4–76.8)

## 2013-01-03 LAB — COMPREHENSIVE METABOLIC PANEL (CC13)
ALT: 30 U/L (ref 0–55)
AST: 21 U/L (ref 5–34)
Albumin: 3.8 g/dL (ref 3.5–5.0)
Alkaline Phosphatase: 75 U/L (ref 40–150)
BUN: 7.8 mg/dL (ref 7.0–26.0)
Creatinine: 0.8 mg/dL (ref 0.6–1.1)
Potassium: 4.1 mEq/L (ref 3.5–5.1)

## 2013-01-04 ENCOUNTER — Ambulatory Visit (HOSPITAL_BASED_OUTPATIENT_CLINIC_OR_DEPARTMENT_OTHER): Payer: Self-pay | Admitting: Internal Medicine

## 2013-01-04 ENCOUNTER — Ambulatory Visit (HOSPITAL_COMMUNITY)
Admission: RE | Admit: 2013-01-04 | Discharge: 2013-01-04 | Disposition: A | Discharge status: Home / Self Care | Payer: Self-pay | Source: Ambulatory Visit | Attending: Internal Medicine | Admitting: Internal Medicine

## 2013-01-04 ENCOUNTER — Encounter: Payer: Self-pay | Admitting: Internal Medicine

## 2013-01-04 ENCOUNTER — Telehealth: Payer: Self-pay | Admitting: Internal Medicine

## 2013-01-04 ENCOUNTER — Encounter (HOSPITAL_COMMUNITY): Payer: Self-pay

## 2013-01-04 VITALS — BP 139/62 | HR 113 | Temp 97.7°F | Resp 18 | Ht 68.0 in | Wt 188.2 lb

## 2013-01-04 DIAGNOSIS — C3490 Malignant neoplasm of unspecified part of unspecified bronchus or lung: Secondary | ICD-10-CM | POA: Insufficient documentation

## 2013-01-04 DIAGNOSIS — R911 Solitary pulmonary nodule: Secondary | ICD-10-CM | POA: Insufficient documentation

## 2013-01-04 MED ORDER — IOHEXOL 300 MG/ML  SOLN
80.0000 mL | Freq: Once | INTRAMUSCULAR | Status: AC | PRN
  Administered 2013-01-04: 80 mL via INTRAVENOUS

## 2013-01-04 NOTE — Patient Instructions (Signed)
No evidence for disease progression on his recent scan. Followup visit in 3 months with repeat CT scan of the chest. 

## 2013-01-04 NOTE — Telephone Encounter (Signed)
Gave pt appt for lab and MD on June 2014  °

## 2013-01-04 NOTE — Progress Notes (Signed)
[facility] Telephone:([phone]   Fax:(336) [phone]  OFFICE PROGRESS NOTE  [name], NP [address] [name] [address]  DIAGNOSIS: Recurrent non-small cell lung cancer, squamous cell carcinoma initially diagnosis stage IIIa (T1 N2 MX ) in August of 2010.   PRIOR THERAPY:  #1 status post concurrent chemoradiation with weekly carboplatin and paclitaxel, last dose of chemotherapy given 08/05/2011.  #2 status post consolidation chemotherapy with carboplatin paclitaxel last dose given 10/27/2009.  #3 Concurrent chemoradiation with weekly carboplatin for an AUC of 2 and paclitaxel at 45 mg per meter squared given concurrent with radiotherapy, last dose was given 09/20/2011.  #4 Systemic chemotherapy with gemcitabine 1000 mg meter squared on days 1 and 8 every 3 weeks status post 3 cycles with stable disease, last dose was given 12/20/2011.   CURRENT THERAPY: None.  INTERVAL HISTORY: Janice Ball 57 y.o. female returns to the clinic today for three-month followup visit. The patient is feeling fine today with no specific complaints. She denied having any significant chest pain or shortness breath but continues to have mild cough with no hemoptysis. She denied having any significant weight loss or night sweats. The patient unfortunately continues to smoke a few cigarettes every day. I strongly encouraged her to quit smoking. She has repeat CT scan of the chest performed recently and she is here for evaluation and discussion of her scan results.   MEDICAL HISTORY: Past Medical History  Diagnosis Date  . Diabetes mellitus   . Anxiety   . Hyperlipidemia   . Hypothyroidism   . COPD (chronic obstructive pulmonary disease)   . Lung cancer     lung ca dx 8/10  . Arthritis     thumbs  . 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 Lung cancer     ALLERGIES:  is allergic to sulfonamide derivatives; codeine; and penicillins.  MEDICATIONS:    Current Outpatient Prescriptions  Medication Sig Dispense Refill  . albuterol (VENTOLIN HFA) 108 (90 BASE) MCG/ACT inhaler Inhale 2 puffs into the lungs every 6 (six) hours as needed.        Marland Kitchen aspirin 325 MG tablet Take 325 mg by mouth daily.        . budesonide-formoterol (SYMBICORT) 160-4.5 MCG/ACT inhaler Inhale 2 puffs into the lungs 2 (two) times daily.      . Chromium 1000 MCG TABS Take 1 tablet by mouth daily.        . fluticasone (FLONASE) 50 MCG/ACT nasal spray Place 2 sprays into the nose daily.  16 g  2  . Guaifenesin (MUCINEX MAXIMUM STRENGTH) 1200 MG TB12 Take 1 tablet by mouth every 12 (twelve) hours as needed.       Marland Kitchen levothyroxine (SYNTHROID, LEVOTHROID) 125 MCG tablet Take 125 mcg by mouth daily.        Marland Kitchen LORazepam (ATIVAN) 1 MG tablet TAKE ONE TABLET BY MOUTH EVERY 6 HOURS AS NEEDED FOR ANXIETY OR NAUSEA  40 tablet  0  . magnesium gluconate (MAGONATE) 500 MG tablet Take 1,000 mg by mouth daily.       . metFORMIN (GLUCOPHAGE) 500 MG tablet Take 500 mg by mouth 2 (two) times daily with a meal.        . metFORMIN (GLUCOPHAGE) 500 MG tablet Take 1 tablet at night  60 tablet  0  . Multiple Vitamins-Minerals (CENTRUM SILVER PO) Take 1 tablet by mouth daily.        Marland Kitchen OVER  THE COUNTER MEDICATION Take 2 capsules by mouth 3 (three) times daily with meals. Glucocil      . sertraline (ZOLOFT) 100 MG tablet Take 100 mg by mouth daily. Takes 1 1/2 tabs daily for total dose of 150 mg      . spironolactone (ALDACTONE) 25 MG tablet Take 25 mg by mouth daily.      . travoprost, benzalkonium, (TRAVATAN) 0.004 % ophthalmic solution Place 1 drop into both eyes at bedtime.        . vitamin B-12 (CYANOCOBALAMIN) 500 MCG tablet Take 500 mcg by mouth daily.         No current facility-administered medications for this visit.    SURGICAL HISTORY:  Past Surgical History  Procedure Laterality Date  . Total abdominal hysterectomy    . Thyroidectomy, partial    . Tonsillectomy    . Tubal ligation       REVIEW OF SYSTEMS:  A comprehensive review of systems was negative except for: Respiratory: positive for cough   PHYSICAL EXAMINATION: General appearance: alert, cooperative and no distress Head: Normocephalic, without obvious abnormality, atraumatic Neck: no adenopathy Resp: clear to auscultation bilaterally Cardio: regular rate and rhythm, S1, S2 normal, no murmur, click, rub or gallop GI: soft, non-tender; bowel sounds normal; no masses,  no organomegaly Extremities: extremities normal, atraumatic, no cyanosis or edema  ECOG PERFORMANCE STATUS: 0 - Asymptomatic  Blood pressure 139/62, pulse 113, temperature 97.7 F (36.5 C), temperature source Oral, resp. rate 18, height 5\' 8"  (1.727 m), weight 188 lb 3.2 oz (85.367 kg).  LABORATORY DATA: Lab Results  Component Value Date   WBC 6.3 01/03/2013   HGB 13.7 01/03/2013   HCT 40.0 01/03/2013   MCV 85.6 01/03/2013   PLT 175 01/03/2013      Chemistry      Component Value Date/Time   NA 142 01/03/2013 0915   NA 136 07/14/2012 1048   NA 141 04/04/2012 0945   K 4.1 01/03/2013 0915   K 4.0 07/14/2012 1048   K 4.7 04/04/2012 0945   CL 103 01/03/2013 0915   CL 102 07/14/2012 1048   CL 100 04/04/2012 0945   CO2 29 01/03/2013 0915   CO2 29 04/04/2012 0945   CO2 19 01/05/2012 1131   BUN 7.8 01/03/2013 0915   BUN 5* 07/14/2012 1048   BUN 16 04/04/2012 0945   CREATININE 0.8 01/03/2013 0915   CREATININE 0.90 07/14/2012 1048   CREATININE 0.9 04/04/2012 0945      Component Value Date/Time   CALCIUM 8.9 01/03/2013 0915   CALCIUM 8.4 04/04/2012 0945   CALCIUM 8.2* 01/05/2012 1131   ALKPHOS 75 01/03/2013 0915   ALKPHOS 61 04/04/2012 0945   ALKPHOS 64 01/05/2012 1131   AST 21 01/03/2013 0915   AST 31 04/04/2012 0945   AST 37 01/05/2012 1131   ALT 30 01/03/2013 0915   ALT 56* 01/05/2012 1131   BILITOT 0.31 01/03/2013 0915   BILITOT 0.50 04/04/2012 0945   BILITOT 0.3 01/05/2012 1131       RADIOGRAPHIC STUDIES: Ct Chest W Contrast  01/04/2013  *RADIOLOGY  REPORT*  Clinical Data: Restaging lung cancer.  CT CHEST WITH CONTRAST  Technique:  Multidetector CT imaging of the chest was performed following the standard protocol during bolus administration of intravenous contrast.  Contrast: 80mL OMNIPAQUE IOHEXOL 300 MG/ML  SOLN  Comparison: 10/02/2012.  Findings: The chest wall is stable.  No breast masses, supraclavicular or axillary adenopathy.  The left lobe of the thyroid  gland appears normal.  The right lobe is not visualized. The bony thorax is intact.  No destructive bone lesions or spinal canal compromise.  The heart is normal in size.  No pericardial effusion.  Stable coronary artery calcifications.  The esophagus is grossly normal. The aorta is normal in caliber.  No dissection.  Stable atherosclerotic calcifications.  No interval change in the pretracheal node on image number 20.  It measures 13 x 8 mm.  Stable minimal soft tissue thickening in the right hilar region but no findings to suggest recurrent tumor.  A small subcarinal node is unchanged.  No hilar adenopathy.  Examination of the lung parenchyma demonstrates radiation changes in the right hilar / paramediastinal lung.  No new or worrisome pulmonary nodules or pleural effusions. A few tiny scattered nodules are unchanged.  The vague nodular ground-glass opacities have resolved and was likely inflammatory change.  The upper abdomen demonstrates stable diffuse fatty infiltration of the liver.  No focal hepatic lesions and no adrenal gland lesions.  IMPRESSION:  1. 1.  Stable radiation changes involving the right lung.  No findings for recurrent tumor. 2.  Stable small mediastinal lymph nodes. 3.  Stable small scattered left lung nodules.  No new or worrisome pulmonary lesions. 4.  Resolution of small patchy ground-glass opacities.   Original Report Authenticated By: Rudie Meyer, M.D.     ASSESSMENT: This is a very pleasant 57 years old white female with recurrent non-small cell lung cancer has been  observation since March of 2013 with no evidence for disease progression.  PLAN: I discussed the scan results with the patient today. I recommended for her to continue on observation with repeat CT scan of the chest in 3 months.  She was advised to call me immediately if she has any concerning symptoms in the interval.  All questions were answered. The patient knows to call the clinic with any problems, questions or concerns. We can certainly see the patient much sooner if necessary.

## 2013-01-22 ENCOUNTER — Ambulatory Visit: Payer: Self-pay | Admitting: Internal Medicine

## 2013-02-07 ENCOUNTER — Ambulatory Visit (INDEPENDENT_AMBULATORY_CARE_PROVIDER_SITE_OTHER): Payer: BC Managed Care – PPO | Admitting: Internal Medicine

## 2013-02-07 ENCOUNTER — Encounter: Payer: Self-pay | Admitting: Internal Medicine

## 2013-02-07 VITALS — BP 124/78 | HR 123 | Temp 98.5°F | Ht 68.0 in | Wt 187.4 lb

## 2013-02-07 DIAGNOSIS — J438 Other emphysema: Secondary | ICD-10-CM

## 2013-02-07 DIAGNOSIS — F17200 Nicotine dependence, unspecified, uncomplicated: Secondary | ICD-10-CM

## 2013-02-07 DIAGNOSIS — J449 Chronic obstructive pulmonary disease, unspecified: Secondary | ICD-10-CM

## 2013-02-07 DIAGNOSIS — R490 Dysphonia: Secondary | ICD-10-CM

## 2013-02-07 DIAGNOSIS — J3489 Other specified disorders of nose and nasal sinuses: Secondary | ICD-10-CM

## 2013-02-07 MED ORDER — FLUTICASONE PROPIONATE 50 MCG/ACT NA SUSP: 2.0000 | Freq: Every day | NASAL | Status: DC

## 2013-02-07 MED ORDER — AZELASTINE HCL 0.15 % NA SOLN: 2.0000 | Freq: Every day | NASAL | Status: DC

## 2013-02-07 MED ORDER — ALBUTEROL SULFATE HFA 108 (90 BASE) MCG/ACT IN AERS: 2.0000 | INHALATION_SPRAY | Freq: Four times a day (QID) | RESPIRATORY_TRACT | Status: DC | PRN

## 2013-02-07 MED ORDER — VARENICLINE TARTRATE 1 MG PO TABS: 1.0000 mg | ORAL_TABLET | Freq: Two times a day (BID) | ORAL | Status: DC

## 2013-02-07 MED ORDER — VARENICLINE TARTRATE 0.5 MG X 11 & 1 MG X 42 PO MISC: ORAL | Status: DC

## 2013-02-07 NOTE — Assessment & Plan Note (Addendum)
She understands that she has relapsed due to social stress. She is motivated to quit. Abdomen other course of Chantix for her. She is fully aware of the side effects and really likes this medication  3 minutes face-to-face counseling

## 2013-02-07 NOTE — Progress Notes (Signed)
Subjective:    Patient ID: Janice Ball, female    DOB: October 23, 1955, 57 y.o.   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 No sign. Change with SABA . DLCO 65%  #Rec AECOPD - June 2012-outpatient treatment with antibiotics and prednisone. - August 2013-outpatient treatment with doxycycline and prednisone - December 2013-treatment with azithromycin and prednisone by primary care physician  #Smoking history - Quit 2010.  - Relapse summer 2012 but quit in 1 month - Relapsed May 2013 and quit with the help of Chantix. Quit July 2013 - Relapsed January 2014  #Chronic postnasal drainage and spring allergies  - nasal steroids  #Lung cancer  DIAGNOSIS: Recurrent non-small cell lung cancer, squamous cell carcinoma initially diagnosis stage IIIa (T1 N2 MX ) in August of 2010.   #1 status post concurrent chemoradiation with weekly carboplatin and paclitaxel, last dose of chemotherapy given 08/05/2011.   #2 status post consolidation chemotherapy with carboplatin paclitaxel last dose given 10/27/2009.   #3 Concurrent chemoradiation with weekly carboplatin for an AUC of 2 and paclitaxel at 45 mg per meter squared given concurrent with radiotherapy, last dose was given  09/20/2011.   #4 Systemic chemotherapy with gemcitabine 1000 mg meter squared on days 1 and 8 every 3 weeks status post 3 cycles with stable disease.   CURRENT THERAPY:  March 2013 - CT chest wihtout recurrence and BREAK IN CHEMO PLANNED.     - June 2014: Next cT chest planned      OV 02/07/2013  For followup for COPD, hoarseness of voice and smoking  - Hoarseness of voice: This is resolved after using nasal steroids. However she is having some worsening spring allergies that is improved by nasal steroid but she's wants better improvement in symptoms. However lower courses of voice only sneezing  and itchy eyes  - Smoking: She had a social setback in her family that I suspect involved her daughter and grandson. She does not want to talk about it in front of her 74-year-old grandson. Suffice it to say that discussed a social stress and relapsed her smoking January 2014. However she wants to quit again and is asking for another Chantix prescription  - COPD: This is stable. She spent a lot of time with a 15-year-old grandchild and if which is more active. Therefore COPD cat score is improved to 14. She continues with Symbicort. She's agreeable to attend pulmonary rehabilitation  CAT COPD Symptom and Quality of Life Score ([employer] trademark) 03/03/12 06/26/2012  02/07/2013   Never Cough -> Cough all the time 4 3 3   No phlegm in chest -> Chest is full of phlegm 3 2 3   No chest tightness -> Chest feels very tight 2 1 1   No dyspnea for 1 flight stairs/hill -> Very dyspneic for 1 flight of stairs 4 4 3   No limitations for ADL at home -> Very limited with ADL at home 3 2 2   Confident leaving home -> Not at all confident leaving home 0 0 0  Sleep soundly -> Do not sleep soundly because of lung condition 1 0 0  Lots of Energy -> No energy at all 4 3 2   TOTAL Score (max 40)  21 15 14       Past, Family, Social reviewed: no change since last visit  Review of Systems  Constitutional: Negative for fever and unexpected weight change.  HENT: Negative for ear pain, nosebleeds, congestion, sore throat, rhinorrhea, sneezing, trouble swallowing, dental problem, postnasal drip and sinus pressure.   Eyes: Negative for redness and itching.  Respiratory: Negative for cough, chest tightness, shortness of breath and wheezing.   Cardiovascular: Negative for palpitations and leg swelling.  Gastrointestinal: Negative for nausea and vomiting.  Genitourinary: Negative for dysuria.  Musculoskeletal: Negative for joint swelling.  Skin: Negative for rash.  Neurological: Negative  for headaches.  Hematological: Does not bruise/bleed easily.  Psychiatric/Behavioral: Negative for dysphoric mood. The patient is not nervous/anxious.   Current outpatient prescriptions:albuterol (VENTOLIN HFA) 108 (90 BASE) MCG/ACT inhaler, Inhale 2 puffs into the lungs every 6 (six) hours as needed.  , Disp: , Rfl: ;  aspirin 325 MG tablet, Take 325 mg by mouth daily.  , Disp: , Rfl: ;  Chromium 1000 MCG TABS, Take 1 tablet by mouth daily.  , Disp: , Rfl: ;  fish oil-omega-3 fatty acids 1000 MG capsule, Take 1 g by mouth daily., Disp: , Rfl:  fluticasone (FLONASE) 50 MCG/ACT nasal spray, Place 2 sprays into the nose daily., Disp: 16 g, Rfl: 2;  glimepiride (AMARYL) 2 MG tablet, Take 1 mg by mouth daily before breakfast., Disp: , Rfl: ;  Guaifenesin (MUCINEX MAXIMUM STRENGTH) 1200 MG TB12, Take 1 tablet by mouth every 12 (twelve) hours as needed. , Disp: , Rfl: ;  levothyroxine (SYNTHROID, LEVOTHROID) 125 MCG tablet, Take 125 mcg by mouth daily.  , Disp: , Rfl:  LORazepam (ATIVAN) 1 MG tablet, TAKE ONE TABLET BY MOUTH EVERY 6 HOURS AS NEEDED FOR ANXIETY OR NAUSEA, Disp: 40 tablet, Rfl: 0;  magnesium gluconate (MAGONATE) 500 MG tablet, Take 1,000 mg by mouth daily. , Disp: , Rfl: ;  metFORMIN (GLUCOPHAGE) 500 MG tablet, Take 1,000 mg by mouth 2 (two) times daily with a meal. , Disp: , Rfl: ;  Multiple Vitamins-Minerals (CENTRUM SILVER PO), Take 1 tablet by mouth daily.  , Disp: , Rfl:  sertraline (ZOLOFT) 100 MG tablet, Take 100 mg by mouth daily. Takes 1 1/2 tabs daily for total dose of 150 mg, Disp: , Rfl: ;  spironolactone (ALDACTONE) 25 MG tablet, Take 25 mg by mouth daily., Disp: , Rfl: ;  travoprost, benzalkonium, (TRAVATAN) 0.004 % ophthalmic solution, Place 1 drop into both eyes at bedtime.  , Disp: , Rfl: ;  vitamin B-12 (CYANOCOBALAMIN) 500 MCG tablet, Take 500 mcg by mouth daily.  , Disp: , Rfl:  budesonide-formoterol (SYMBICORT) 160-4.5 MCG/ACT inhaler, Inhale 2 puffs into the lungs 2 (two) times  daily., Disp: , Rfl:       Objective:   Physical Exam    Physical Exam GEN: A/Ox3; pleasant , NAD  HEENT:  [place]/AT,  EACs-clear, TMs-wnl, NOSE-clear drainage , nontender sinus , THROAT-clear, no lesions, no postnasal drip or exudate noted.   NECK:  Supple w/ fair ROM; no JVD; normal carotid impulses w/o bruits; no thyromegaly or nodules palpated; no lymphadenopathy.  RESP  F no wheeze , no accessory muscle use, no dullness to percussion  CARD:  RRR, no m/r/g  , no peripheral edema, pulses intact, no cyanosis or clubbing.  GI:   Soft & nt; nml bowel sounds; no organomegaly or masses detected.  Musco: Warm bil, no deformities or joint swelling noted.   Neuro: alert, no focal deficits noted.    Skin: Warm, no lesions or rashes       Assessment &  Plan:

## 2013-02-07 NOTE — Assessment & Plan Note (Signed)
Improved oral nasal steroids but slightly worse due to the polyp. I have asked her to add astepro for a few weeks during the pollen season

## 2013-02-07 NOTE — Assessment & Plan Note (Addendum)
Stable disease. I've asked her to continue her Symbicort. At followup visit I will discuss N. acetylcysteine to reduce risk off COPD exacerbations and I will also talk about the use Symbicort and cutting the dose down or switching her to Spiriva/the newer anticholinergic I will also refer her to pulmonary rehabilitation

## 2013-02-07 NOTE — Assessment & Plan Note (Signed)
This is resolved with the use of nasal steroids. We'll continue to monitor. If this relapses we'll refer to ENT surgeon

## 2013-02-07 NOTE — Patient Instructions (Addendum)
#  COPD - Stable disease  - continue your symbicort; take a sample. WEan down next time dose of symbicort  - will refer you to pulmonary rehabilitation  #Smoking   - you can quit again. Glad you are motivated to quit again  - take  3 months script for chantix  Smoking  - start chantix as directed  - if you have bad dream stop medication and call us  - take the medicaiton as instructed and after food - quit smoking  1 week after starting chantix    #Hoarseness of voice  - glad this is resolved  #Allergic rhinitis  -  take generic fluticasone inhaler 2 squirts each nostril daily - start astepro 2 squirts each nostril daily  X 2 weeks  #followup  6 mnths or sooner if neeed CAT score at followup

## 2013-03-15 ENCOUNTER — Telehealth (HOSPITAL_COMMUNITY): Payer: Self-pay | Admitting: *Deleted

## 2013-03-15 NOTE — Telephone Encounter (Signed)
Call placed to patient regarding referral to Pulmonary Rehab.  No answer, left message requesting a return call. Cathie Olden RN

## 2013-03-20 ENCOUNTER — Telehealth (HOSPITAL_COMMUNITY): Payer: Self-pay | Admitting: Cardiac Rehabilitation

## 2013-03-20 NOTE — Telephone Encounter (Signed)
After phone messages left for pt to enroll in pulmonary rehab with no response, letter mailed to home address

## 2013-04-02 ENCOUNTER — Telehealth (HOSPITAL_COMMUNITY): Payer: Self-pay | Admitting: *Deleted

## 2013-04-02 NOTE — Telephone Encounter (Signed)
Janice Ball returned call to Pulmonary Rehab on 03/29/13.  We were unable to speak with her on that day, return call today regarding Pulmonary Rehab.  She does want to attend, she is now in [name], [name] helping with a close friend that has terminal cancer.  Unsure when she will return to [name], she will call us to schedule when she returns.  Cathie Olden RN

## 2013-04-06 ENCOUNTER — Other Ambulatory Visit (HOSPITAL_BASED_OUTPATIENT_CLINIC_OR_DEPARTMENT_OTHER): Payer: BC Managed Care – PPO

## 2013-04-06 ENCOUNTER — Ambulatory Visit (HOSPITAL_COMMUNITY)
Admission: RE | Admit: 2013-04-06 | Discharge: 2013-04-06 | Disposition: A | Discharge status: Home / Self Care | Payer: BC Managed Care – PPO | Source: Ambulatory Visit | Attending: Internal Medicine | Admitting: Internal Medicine

## 2013-04-06 ENCOUNTER — Encounter (HOSPITAL_COMMUNITY): Payer: Self-pay

## 2013-04-06 DIAGNOSIS — K7689 Other specified diseases of liver: Secondary | ICD-10-CM | POA: Insufficient documentation

## 2013-04-06 DIAGNOSIS — C3490 Malignant neoplasm of unspecified part of unspecified bronchus or lung: Secondary | ICD-10-CM

## 2013-04-06 DIAGNOSIS — Z79899 Other long term (current) drug therapy: Secondary | ICD-10-CM | POA: Insufficient documentation

## 2013-04-06 DIAGNOSIS — Z923 Personal history of irradiation: Secondary | ICD-10-CM | POA: Insufficient documentation

## 2013-04-06 DIAGNOSIS — I700 Atherosclerosis of aorta: Secondary | ICD-10-CM | POA: Insufficient documentation

## 2013-04-06 DIAGNOSIS — J8410 Pulmonary fibrosis, unspecified: Secondary | ICD-10-CM | POA: Insufficient documentation

## 2013-04-06 DIAGNOSIS — J479 Bronchiectasis, uncomplicated: Secondary | ICD-10-CM | POA: Insufficient documentation

## 2013-04-06 DIAGNOSIS — I2510 Atherosclerotic heart disease of native coronary artery without angina pectoris: Secondary | ICD-10-CM | POA: Insufficient documentation

## 2013-04-06 DIAGNOSIS — J398 Other specified diseases of upper respiratory tract: Secondary | ICD-10-CM | POA: Insufficient documentation

## 2013-04-06 DIAGNOSIS — E278 Other specified disorders of adrenal gland: Secondary | ICD-10-CM | POA: Insufficient documentation

## 2013-04-06 DIAGNOSIS — M4120 Other idiopathic scoliosis, site unspecified: Secondary | ICD-10-CM | POA: Insufficient documentation

## 2013-04-06 LAB — CBC WITH DIFFERENTIAL/PLATELET
BASO%: 1.4 % (ref 0.0–2.0)
EOS%: 2.3 % (ref 0.0–7.0)
MCH: 29.5 pg (ref 25.1–34.0)
MCHC: 34.5 g/dL (ref 31.5–36.0)
MCV: 85.5 fL (ref 79.5–101.0)
MONO%: 6.4 % (ref 0.0–14.0)
RBC: 4.67 10*6/uL (ref 3.70–5.45)
RDW: 14.4 % (ref 11.2–14.5)

## 2013-04-06 LAB — COMPREHENSIVE METABOLIC PANEL (CC13)
AST: 20 U/L (ref 5–34)
Albumin: 3.8 g/dL (ref 3.5–5.0)
Alkaline Phosphatase: 64 U/L (ref 40–150)
BUN: 11.4 mg/dL (ref 7.0–26.0)
Potassium: 4.2 mEq/L (ref 3.5–5.1)
Sodium: 139 mEq/L (ref 136–145)
Total Protein: 7.4 g/dL (ref 6.4–8.3)

## 2013-04-06 MED ORDER — IOHEXOL 300 MG/ML  SOLN
80.0000 mL | Freq: Once | INTRAMUSCULAR | Status: AC | PRN
  Administered 2013-04-06: 80 mL via INTRAVENOUS

## 2013-04-09 ENCOUNTER — Ambulatory Visit: Payer: Self-pay | Admitting: Internal Medicine

## 2013-04-09 ENCOUNTER — Telehealth: Payer: Self-pay | Admitting: Internal Medicine

## 2013-04-09 ENCOUNTER — Encounter: Payer: Self-pay | Admitting: Internal Medicine

## 2013-04-09 ENCOUNTER — Ambulatory Visit (HOSPITAL_BASED_OUTPATIENT_CLINIC_OR_DEPARTMENT_OTHER): Payer: BC Managed Care – PPO | Admitting: Internal Medicine

## 2013-04-09 VITALS — BP 119/66 | HR 105 | Temp 98.1°F | Resp 18 | Ht 68.0 in | Wt 189.1 lb

## 2013-04-09 DIAGNOSIS — C3490 Malignant neoplasm of unspecified part of unspecified bronchus or lung: Secondary | ICD-10-CM

## 2013-04-09 NOTE — Telephone Encounter (Signed)
gv and printed appt sched and avs for pt  °

## 2013-04-09 NOTE — Patient Instructions (Addendum)
No evidence for disease progression on the recent scan. Follow up visit in 3 months with repeat CT scan of the chest 

## 2013-04-09 NOTE — Progress Notes (Signed)
[facility] [phone]   [phone]  OFFICE PROGRESS NOTE  [name], NP [address] [name] [address]   DIAGNOSIS: Recurrent non-small cell lung cancer, squamous cell carcinoma initially diagnosis stage IIIa (T1 N2 MX ) in August of 2010.   PRIOR THERAPY:  #1 status post concurrent chemoradiation with weekly carboplatin and paclitaxel, last dose of chemotherapy given 08/05/2011.  #2 status post consolidation chemotherapy with carboplatin paclitaxel last dose given 10/27/2009.  #3 Concurrent chemoradiation with weekly carboplatin for an AUC of 2 and paclitaxel at 45 mg per meter squared given concurrent with radiotherapy, last dose was given 09/20/2011.  #4 Systemic chemotherapy with gemcitabine 1000 mg meter squared on days 1 and 8 every 3 weeks status post 3 cycles with stable disease, last dose was given 12/20/2011.   CURRENT THERAPY: None.  INTERVAL HISTORY: Janice Ball 56 y.o. female returns to the clinic today for three-month followup visit. The patient is not feeling well today and mildly depressed after she lost a close friend to breast cancer recently. She denied having any significant fatigue or weakness. She denied having any chest pain, shortness breath, cough or hemoptysis. The patient had repeat CT scan of the chest performed recently and she is here for evaluation and discussion of her scan results.  MEDICAL HISTORY: Past Medical History  Diagnosis Date  . Diabetes mellitus   . Anxiety   . Hyperlipidemia   . Hypothyroidism   . COPD (chronic obstructive pulmonary disease)   . Arthritis     thumbs  . 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 Lung cancer     lung ca dx 8/10  . Lung cancer     ALLERGIES:  is allergic to sulfonamide derivatives; codeine; and penicillins.  MEDICATIONS:  Current Outpatient Prescriptions  Medication Sig Dispense Refill  . albuterol (VENTOLIN HFA) 108 (90  BASE) MCG/ACT inhaler Inhale 2 puffs into the lungs every 6 (six) hours as needed.  1 Inhaler  6  . aspirin 325 MG tablet Take 325 mg by mouth daily.        . Chromium 1000 MCG TABS Take 1 tablet by mouth daily.        . fish oil-omega-3 fatty acids 1000 MG capsule Take 1 g by mouth daily.      . fluticasone (FLONASE) 50 MCG/ACT nasal spray Place 2 sprays into the nose daily.  16 g  6  . glimepiride (AMARYL) 2 MG tablet Take 1 mg by mouth daily before breakfast.      . Guaifenesin (MUCINEX MAXIMUM STRENGTH) 1200 MG TB12 Take 1 tablet by mouth every 12 (twelve) hours as needed.       Marland Kitchen levothyroxine (SYNTHROID, LEVOTHROID) 125 MCG tablet Take 125 mcg by mouth daily.        Marland Kitchen LORazepam (ATIVAN) 1 MG tablet TAKE ONE TABLET BY MOUTH EVERY 6 HOURS AS NEEDED FOR ANXIETY OR NAUSEA  40 tablet  0  . magnesium gluconate (MAGONATE) 500 MG tablet Take 1,000 mg by mouth daily.       . metFORMIN (GLUCOPHAGE) 500 MG tablet Take 1,000 mg by mouth 2 (two) times daily with a meal.       . Multiple Vitamins-Minerals (CENTRUM SILVER PO) Take 1 tablet by mouth daily.        . sertraline (ZOLOFT) 100 MG tablet Take 100 mg by mouth daily. Takes 1 1/2 tabs daily for total dose of 150  mg      . spironolactone (ALDACTONE) 25 MG tablet Take 25 mg by mouth daily.      . travoprost, benzalkonium, (TRAVATAN) 0.004 % ophthalmic solution Place 1 drop into both eyes at bedtime.        . varenicline (CHANTIX CONTINUING MONTH PAK) 1 MG tablet Take 1 tablet (1 mg total) by mouth 2 (two) times daily. Start after complete starting pack.  60 tablet  3  . varenicline (CHANTIX STARTING MONTH PAK) 0.5 MG X 11 & 1 MG X 42 tablet Take one 0.5 mg tablet by mouth once daily for 3 days, then increase to one 0.5 mg tablet twice daily for 4 days, then increase to one 1 mg tablet twice daily.  53 tablet  0  . vitamin B-12 (CYANOCOBALAMIN) 500 MCG tablet Take 500 mcg by mouth daily.        . Azelastine HCl (ASTEPRO) 0.15 % SOLN Place 2 sprays into  the nose daily. For 2 weeks only  30 mL  1  . budesonide-formoterol (SYMBICORT) 160-4.5 MCG/ACT inhaler Inhale 2 puffs into the lungs 2 (two) times daily.       No current facility-administered medications for this visit.    SURGICAL HISTORY:  Past Surgical History  Procedure Laterality Date  . Total abdominal hysterectomy    . Thyroidectomy, partial    . Tonsillectomy    . Tubal ligation      REVIEW OF SYSTEMS:  A comprehensive review of systems was negative.   PHYSICAL EXAMINATION: General appearance: alert, cooperative and no distress Head: Normocephalic, without obvious abnormality, atraumatic Neck: no adenopathy Lymph nodes: Cervical, supraclavicular, and axillary nodes normal. Resp: clear to auscultation bilaterally Cardio: regular rate and rhythm, S1, S2 normal, no murmur, click, rub or gallop GI: soft, non-tender; bowel sounds normal; no masses,  no organomegaly Extremities: extremities normal, atraumatic, no cyanosis or edema  ECOG PERFORMANCE STATUS: 0 - Asymptomatic  Blood pressure 119/66, pulse 105, temperature 98.1 F (36.7 C), temperature source Oral, resp. rate 18, height 5\' 8"  (1.727 m), weight 189 lb 1.6 oz (85.775 kg), SpO2 96.00%.  LABORATORY DATA: Lab Results  Component Value Date   WBC 7.0 04/06/2013   HGB 13.8 04/06/2013   HCT 39.9 04/06/2013   MCV 85.5 04/06/2013   PLT 195 04/06/2013      Chemistry      Component Value Date/Time   NA 139 04/06/2013 1041   NA 136 07/14/2012 1048   NA 141 04/04/2012 0945   K 4.2 04/06/2013 1041   K 4.0 07/14/2012 1048   K 4.7 04/04/2012 0945   CL 101 04/06/2013 1041   CL 102 07/14/2012 1048   CL 100 04/04/2012 0945   CO2 26 04/06/2013 1041   CO2 29 04/04/2012 0945   CO2 19 01/05/2012 1131   BUN 11.4 04/06/2013 1041   BUN 5* 07/14/2012 1048   BUN 16 04/04/2012 0945   CREATININE 0.7 04/06/2013 1041   CREATININE 0.90 07/14/2012 1048   CREATININE 0.9 04/04/2012 0945      Component Value Date/Time   CALCIUM 8.8 04/06/2013 1041     CALCIUM 8.4 04/04/2012 0945   CALCIUM 8.2* 01/05/2012 1131   ALKPHOS 64 04/06/2013 1041   ALKPHOS 61 04/04/2012 0945   ALKPHOS 64 01/05/2012 1131   AST 20 04/06/2013 1041   AST 31 04/04/2012 0945   AST 37 01/05/2012 1131   ALT 24 04/06/2013 1041   ALT 56* 01/05/2012 1131   BILITOT 0.34 04/06/2013 1041  BILITOT 0.50 04/04/2012 0945   BILITOT 0.3 01/05/2012 1131       RADIOGRAPHIC STUDIES: Ct Chest W Contrast  04/06/2013   *RADIOLOGY REPORT*  Clinical Data: Restaging lung cancer diagnosed in 2010.  Radiation therapy completed 2 years ago.  Chemotherapy ongoing.  CT CHEST WITH CONTRAST  Technique:  Multidetector CT imaging of the chest was performed following the standard protocol during bolus administration of intravenous contrast.  Contrast: 80mL OMNIPAQUE IOHEXOL 300 MG/ML  SOLN  Comparison: Chest CTs dated 01/04/2013 and 10/02/2012.  Findings: The thoracic inlet has a stable appearance status post resection of the right thyroid lobe.  The left thyroid lobe appears normal. There is a stable diverticulum of the trachea on image number 10.  There is stable atherosclerosis of the aorta, great vessels and coronary arteries.  A precarinal node measures 9 mm short axis on image 21, unchanged. No pathologically enlarged mediastinal or hilar lymph nodes are demonstrated.  There is stable fibrosis surrounding the right hilum.  Radiation changes extend into the medial right lung with associated with central bronchiectasis.  Scattered small nodules throughout the left lung are stable.  No new, enlarging or otherwise worrisome pulmonary findings are demonstrated.  The upper abdomen has a stable appearance with diffuse hepatic steatosis and a small nodule of the lateral limb of the right adrenal gland.   There are no worrisome osseous findings. Thoracic scoliosis is noted.  IMPRESSION:  1.  Stable chest CT with radiation changes in the perihilar right lung as described. 2.  Stable small mediastinal lymph nodes and  scattered pulmonary nodules on the left. 3.  No evidence of local recurrence or metastatic disease.   Original Report Authenticated By: Carey Bullocks, M.D.    ASSESSMENT AND PLAN: This is a very pleasant 57 years old white female with recurrent non-small cell lung cancer treated with systemic chemotherapy with single agent gemcitabine completed in March of 2013 and has been observation since that time was no evidence for disease progression. I discussed the scan results with the patient today. I recommended for her to continue on observation with repeat CT scan of the chest in 3 months. She was advised to call immediately if she has any concerning symptoms in the interval.  All questions were answered. The patient knows to call the clinic with any problems, questions or concerns. We can certainly see the patient much sooner if necessary.

## 2013-07-09 ENCOUNTER — Other Ambulatory Visit: Payer: BC Managed Care – PPO | Admitting: Lab

## 2013-07-09 ENCOUNTER — Ambulatory Visit (HOSPITAL_COMMUNITY): Payer: Medicare Other

## 2013-07-10 ENCOUNTER — Other Ambulatory Visit: Payer: Medicare Other | Admitting: Lab

## 2013-07-10 ENCOUNTER — Ambulatory Visit (HOSPITAL_COMMUNITY)
Admission: RE | Admit: 2013-07-10 | Discharge: 2013-07-10 | Disposition: A | Discharge status: Home / Self Care | Payer: Medicare Other | Source: Ambulatory Visit | Attending: Internal Medicine | Admitting: Internal Medicine

## 2013-07-10 ENCOUNTER — Encounter (HOSPITAL_COMMUNITY): Payer: Self-pay

## 2013-07-10 DIAGNOSIS — K7689 Other specified diseases of liver: Secondary | ICD-10-CM | POA: Insufficient documentation

## 2013-07-10 DIAGNOSIS — C3490 Malignant neoplasm of unspecified part of unspecified bronchus or lung: Secondary | ICD-10-CM | POA: Insufficient documentation

## 2013-07-10 LAB — CBC WITH DIFFERENTIAL/PLATELET
Basophils Absolute: 0.1 10*3/uL (ref 0.0–0.1)
Eosinophils Absolute: 0.2 10*3/uL (ref 0.0–0.5)
HCT: 40.5 % (ref 34.8–46.6)
HGB: 13.6 g/dL (ref 11.6–15.9)
MCV: 85.1 fL (ref 79.5–101.0)
MONO%: 6.4 % (ref 0.0–14.0)
NEUT#: 5.3 10*3/uL (ref 1.5–6.5)
NEUT%: 67.9 % (ref 38.4–76.8)
RDW: 14.3 % (ref 11.2–14.5)

## 2013-07-10 LAB — COMPREHENSIVE METABOLIC PANEL (CC13)
Albumin: 3.8 g/dL (ref 3.5–5.0)
Alkaline Phosphatase: 60 U/L (ref 40–150)
BUN: 10.4 mg/dL (ref 7.0–26.0)
Calcium: 9.1 mg/dL (ref 8.4–10.4)
Chloride: 103 mEq/L (ref 98–109)
Creatinine: 0.7 mg/dL (ref 0.6–1.1)
Glucose: 151 mg/dl — ABNORMAL HIGH (ref 70–140)
Potassium: 4 mEq/L (ref 3.5–5.1)

## 2013-07-10 MED ORDER — IOHEXOL 300 MG/ML  SOLN
80.0000 mL | Freq: Once | INTRAMUSCULAR | Status: AC | PRN
  Administered 2013-07-10: 80 mL via INTRAVENOUS

## 2013-07-11 ENCOUNTER — Ambulatory Visit (HOSPITAL_BASED_OUTPATIENT_CLINIC_OR_DEPARTMENT_OTHER): Payer: Medicare Other | Admitting: Internal Medicine

## 2013-07-11 ENCOUNTER — Telehealth: Payer: Self-pay | Admitting: Internal Medicine

## 2013-07-11 VITALS — BP 134/62 | HR 110 | Temp 97.7°F | Resp 18 | Ht 68.0 in | Wt 185.9 lb

## 2013-07-11 DIAGNOSIS — C3490 Malignant neoplasm of unspecified part of unspecified bronchus or lung: Secondary | ICD-10-CM

## 2013-07-11 NOTE — Telephone Encounter (Signed)
gv pt appt schedule for December. Central to contact pt w/ct appt - pt aware.

## 2013-07-11 NOTE — Telephone Encounter (Signed)
gv pt appt schedule for December. °

## 2013-07-11 NOTE — Progress Notes (Signed)
[facility] [phone]   [phone]  OFFICE PROGRESS NOTE  [name], NP [address] [name] [address]  DIAGNOSIS: Recurrent non-small cell lung cancer, squamous cell carcinoma initially diagnosis stage IIIa (T1 N2 MX ) in August of 2010.   PRIOR THERAPY:  #1 status post concurrent chemoradiation with weekly carboplatin and paclitaxel, last dose of chemotherapy given 08/05/2011.  #2 status post consolidation chemotherapy with carboplatin paclitaxel last dose given 10/27/2009.  #3 Concurrent chemoradiation with weekly carboplatin for an AUC of 2 and paclitaxel at 45 mg per meter squared given concurrent with radiotherapy, last dose was given 09/20/2011.  #4 Systemic chemotherapy with gemcitabine 1000 mg meter squared on days 1 and 8 every 3 weeks status post 3 cycles with stable disease, last dose was given 12/20/2011.   CURRENT THERAPY: None.  INTERVAL HISTORY: Janice Ball 57 y.o. female returns to the clinic today for routine three-month follow up visit. The patient has no complaints today. She denied having any significant weight loss or night sweats. She has no chest pain, shortness breath, cough or hemoptysis. The patient had repeat CT scan of the chest performed recently and she is here for evaluation and discussion of her scan results.  MEDICAL HISTORY: Past Medical History  Diagnosis Date  . Diabetes mellitus   . Anxiety   . Hyperlipidemia   . Hypothyroidism   . COPD (chronic obstructive pulmonary disease)   . Arthritis     thumbs  . 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 Lung cancer     lung ca dx 8/10  . Lung cancer     ALLERGIES:  is allergic to sulfonamide derivatives; codeine; and penicillins.  MEDICATIONS:  Current Outpatient Prescriptions  Medication Sig Dispense Refill  . albuterol (VENTOLIN HFA) 108 (90 BASE) MCG/ACT inhaler Inhale 2 puffs into the lungs every 6 (six) hours as  needed.  1 Inhaler  6  . aspirin 325 MG tablet Take 325 mg by mouth daily.        . Azelastine HCl (ASTEPRO) 0.15 % SOLN Place 2 sprays into the nose daily. For 2 weeks only  30 mL  1  . budesonide-formoterol (SYMBICORT) 160-4.5 MCG/ACT inhaler Inhale 2 puffs into the lungs 2 (two) times daily.      . Chromium 1000 MCG TABS Take 1 tablet by mouth daily.        . fish oil-omega-3 fatty acids 1000 MG capsule Take 1 g by mouth daily.      . fluticasone (FLONASE) 50 MCG/ACT nasal spray Place 2 sprays into the nose daily.  16 g  6  . glimepiride (AMARYL) 2 MG tablet Take 1 mg by mouth daily before breakfast.      . Guaifenesin (MUCINEX MAXIMUM STRENGTH) 1200 MG TB12 Take 1 tablet by mouth every 12 (twelve) hours as needed.       Marland Kitchen levofloxacin (LEVAQUIN) 500 MG tablet Take 500 mg by mouth daily. 10 days supply      . levothyroxine (SYNTHROID, LEVOTHROID) 125 MCG tablet Take 125 mcg by mouth daily.        Marland Kitchen LORazepam (ATIVAN) 1 MG tablet TAKE ONE TABLET BY MOUTH EVERY 6 HOURS AS NEEDED FOR ANXIETY OR NAUSEA  40 tablet  0  . magnesium gluconate (MAGONATE) 500 MG tablet Take 1,000 mg by mouth daily.       . metFORMIN (GLUCOPHAGE) 500 MG tablet Take 1,000 mg by mouth 2 (  two) times daily with a meal.       . Multiple Vitamins-Minerals (CENTRUM SILVER PO) Take 1 tablet by mouth daily.        . sertraline (ZOLOFT) 100 MG tablet Take 100 mg by mouth daily. Takes 1 1/2 tabs daily for total dose of 150 mg      . spironolactone (ALDACTONE) 25 MG tablet Take 25 mg by mouth daily.      . travoprost, benzalkonium, (TRAVATAN) 0.004 % ophthalmic solution Place 1 drop into both eyes at bedtime.        . varenicline (CHANTIX CONTINUING MONTH PAK) 1 MG tablet Take 1 tablet (1 mg total) by mouth 2 (two) times daily. Start after complete starting pack.  60 tablet  3  . varenicline (CHANTIX STARTING MONTH PAK) 0.5 MG X 11 & 1 MG X 42 tablet Take one 0.5 mg tablet by mouth once daily for 3 days, then increase to one 0.5 mg  tablet twice daily for 4 days, then increase to one 1 mg tablet twice daily.  53 tablet  0  . vitamin B-12 (CYANOCOBALAMIN) 500 MCG tablet Take 500 mcg by mouth daily.         No current facility-administered medications for this visit.    SURGICAL HISTORY:  Past Surgical History  Procedure Laterality Date  . Total abdominal hysterectomy    . Thyroidectomy, partial    . Tonsillectomy    . Tubal ligation      REVIEW OF SYSTEMS:  A comprehensive review of systems was negative.   PHYSICAL EXAMINATION: General appearance: alert, cooperative and no distress Head: Normocephalic, without obvious abnormality, atraumatic Neck: no adenopathy, no JVD, supple, symmetrical, trachea midline and thyroid not enlarged, symmetric, no tenderness/mass/nodules Lymph nodes: Cervical, supraclavicular, and axillary nodes normal. Resp: clear to auscultation bilaterally Back: symmetric, no curvature. ROM normal. No CVA tenderness. Cardio: regular rate and rhythm, S1, S2 normal, no murmur, click, rub or gallop GI: soft, non-tender; bowel sounds normal; no masses,  no organomegaly Extremities: extremities normal, atraumatic, no cyanosis or edema  ECOG PERFORMANCE STATUS: 0 - Asymptomatic  Blood pressure 134/62, pulse 110, temperature 97.7 F (36.5 C), temperature source Oral, resp. rate 18, height 5\' 8"  (1.727 m), weight 185 lb 14.4 oz (84.324 kg), SpO2 98.00%.  LABORATORY DATA: Lab Results  Component Value Date   WBC 7.8 07/10/2013   HGB 13.6 07/10/2013   HCT 40.5 07/10/2013   MCV 85.1 07/10/2013   PLT 197 07/10/2013      Chemistry      Component Value Date/Time   NA 142 07/10/2013 0805   NA 136 07/14/2012 1048   NA 141 04/04/2012 0945   K 4.0 07/10/2013 0805   K 4.0 07/14/2012 1048   K 4.7 04/04/2012 0945   CL 101 04/06/2013 1041   CL 102 07/14/2012 1048   CL 100 04/04/2012 0945   CO2 26 07/10/2013 0805   CO2 29 04/04/2012 0945   CO2 19 01/05/2012 1131   BUN 10.4 07/10/2013 0805   BUN 5* 07/14/2012 1048    BUN 16 04/04/2012 0945   CREATININE 0.7 07/10/2013 0805   CREATININE 0.90 07/14/2012 1048   CREATININE 0.9 04/04/2012 0945      Component Value Date/Time   CALCIUM 9.1 07/10/2013 0805   CALCIUM 8.4 04/04/2012 0945   CALCIUM 8.2* 01/05/2012 1131   ALKPHOS 60 07/10/2013 0805   ALKPHOS 61 04/04/2012 0945   ALKPHOS 64 01/05/2012 1131   AST 15 07/10/2013 0805  AST 31 04/04/2012 0945   AST 37 01/05/2012 1131   ALT 20 07/10/2013 0805   ALT 30 04/04/2012 0945   ALT 56* 01/05/2012 1131   BILITOT 0.27 07/10/2013 0805   BILITOT 0.50 04/04/2012 0945   BILITOT 0.3 01/05/2012 1131       RADIOGRAPHIC STUDIES: Ct Chest W Contrast  07/10/2013   CLINICAL DATA:  Followup lung carcinoma  EXAM: CT CHEST WITH CONTRAST  TECHNIQUE: Multidetector CT imaging of the chest was performed during intravenous contrast administration.  CONTRAST:  80mL OMNIPAQUE IOHEXOL 300 MG/ML  SOLN  COMPARISON:  April 06, 2013 and January 04, 2013 chest CT examinations  FINDINGS: There is postradiation therapy change on the right with cicatrization in the right perihilar region which is stable. The degree of opacity in this area has not changed compared to prior study. There is some patchy opacity in the superior segment right lower lobe which is stable and probably is due to post radiation fibrosis. There is no new parenchymal lung opacity on either side. There are a few areas of minimal scarring which appear stable. A 3 mm nodular lesion in the superior segment of the left lower lobe on slice 29 is stable. A 3 mm nodular opacity in the left apex on slice 9 is stable as is 2 mm nodular opacity in the anterior segment of the left upper lobe on slice 14 and a 3 mm nodular lesion in the posterior segment of the left upper lobe on slice 18. There is a stable 1 mm nodular opacity in the periphery of the posterior segment of the left upper lobe seen on slice 22.  There is no the new thoracic lymph node prominence. There is a stable precarinal lymph node  measuring 1.7 x 1.0 cm. There are several subcentimeter subcarinal lymph nodes which are stable.  Pericardium is minimally thickened but stable.  Visualized upper abdominal structures appear unremarkable except for atherosclerotic change in the very aorta and fatty liver. There are no blastic or lytic bone lesions. 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 Remainder of thyroid appears normal. There is upper thoracic levoscoliosis.  IMPRESSION: Postradiation therapy change in the right perihilar and right lower lobe superior segment regions are stable. Small nodular opacities on the left remain stable. There is no new opacity on either side. There is a single mildly prominent precarinal lymph node which is stable. There is no new lymph node prominence. Slight pericardial thickening is stable. There is fatty liver.   Electronically Signed   By: Bretta Bang   On: 07/10/2013 08:50    ASSESSMENT AND PLAN: this is a very pleasant 57 years old white female with recurrent non-small cell lung cancer status post concurrent chemoradiation as well as consolidation chemotherapy and she is currently on observation.  She has no evidence for disease progression on his recent scan. I discussed the scan results with the patient and recommended for her to continue on observation for now.  She would have repeat CT scan of the chest in 3 months. The patient would come back for follow up visit at that time. She was advised to call immediately if she has any concerning symptoms in the interval. The patient voices understanding of current disease status and treatment options and is in agreement with the current care plan.  All questions were answered. The patient knows to call the clinic with any problems, questions or concerns. We can certainly see the patient much sooner if  necessary.

## 2013-07-13 ENCOUNTER — Encounter: Payer: Self-pay | Admitting: Internal Medicine

## 2013-07-13 NOTE — Patient Instructions (Signed)
Followup visit in 3 months with repeat CT scan of the chest. 

## 2013-08-20 ENCOUNTER — Ambulatory Visit: Payer: Medicare Other | Admitting: Internal Medicine

## 2013-08-23 ENCOUNTER — Other Ambulatory Visit: Payer: Self-pay

## 2013-08-29 ENCOUNTER — Ambulatory Visit (INDEPENDENT_AMBULATORY_CARE_PROVIDER_SITE_OTHER): Payer: Medicare Other | Admitting: Internal Medicine

## 2013-08-29 ENCOUNTER — Other Ambulatory Visit: Payer: Self-pay | Admitting: Internal Medicine

## 2013-08-29 ENCOUNTER — Encounter: Payer: Self-pay | Admitting: Internal Medicine

## 2013-08-29 VITALS — BP 130/80 | HR 114 | Ht 68.0 in | Wt 189.4 lb

## 2013-08-29 DIAGNOSIS — J449 Chronic obstructive pulmonary disease, unspecified: Secondary | ICD-10-CM

## 2013-08-29 DIAGNOSIS — J441 Chronic obstructive pulmonary disease with (acute) exacerbation: Secondary | ICD-10-CM

## 2013-08-29 MED ORDER — PREDNISONE (PAK) 5 MG PO TABS: ORAL_TABLET | ORAL | Status: DC

## 2013-08-29 NOTE — Patient Instructions (Signed)
#  COPD exacerbation - in flare up - stop your leavaquin that you started 08/23/13 - Please take prednisone 40 mg x1 day, then 30 mg x1 day, then 20 mg x1 day, then 10 mg x1 day, and then 5 mg x1 day and stop   #COPD  - glad you had flu shot  - continue your symbicort; will do script - will evaluate you for IMPACT and GALATHEA studies (research)  #Smoking   - glad you quit  #followup  6 mnths or sooner if neeed CAT score at followup

## 2013-08-29 NOTE — Progress Notes (Signed)
Subjective:    Patient ID: Janice Ball, female    DOB: Sep 14, 1956, 57 y.o.   MRN: [id number]  HPI #Gold stage 2 COPD - MM genotype and class II dyspnea - July 2012: PFT today that showed preserved lung fxn -mild COPD - FEV1 2.19 L/M (80%), ratio of 66. No sign. Change with SABA . DLCO 65%  #Rec AECOPD - June 2012-outpatient treatment with antibiotics and prednisone. - August 2013-outpatient treatment with doxycycline and prednisone - December 2013-treatment with azithromycin and prednisone by primary care physician - sept 2014 - abx and 1 shot of steroid at PMD office [facility], NP - Nov 2014 - levaquin and steroid 5 day  #Smoking history - Quit 2010.  - Relapse summer 2012 but quit in 1 month - Relapsed May 2013 and quit with the help of Chantix. Quit July 2013 - Relapsed January 2014; due to social stressor; quit again May 2014  #Chronic postnasal drainage and spring allergies  - nasal steroids  #Lung cancer  DIAGNOSIS: Recurrent non-small cell lung cancer, squamous cell carcinoma initially diagnosis stage IIIa (T1 N2 MX ) in August of 2010.   #1 status post concurrent chemoradiation with weekly carboplatin and paclitaxel, last dose of chemotherapy given 08/05/2011.   #2 status post consolidation chemotherapy with carboplatin paclitaxel last dose given 10/27/2009.   #3 Concurrent chemoradiation with weekly carboplatin for an AUC of 2 and paclitaxel at 45 mg per meter squared given concurrent with radiotherapy, last dose was given  09/20/2011.   #4 Systemic chemotherapy with gemcitabine 1000 mg meter squared on days 1 and 8 every 3 weeks status post 3 cycles with stable disease.   CURRENT THERAPY:  March 2013 - CT chest wihtout recurrence and BREAK IN CHEMO PLANNED.     - JSept 2014: stable post XRT changes      OV 02/07/2013  For followup for COPD, hoarseness of voice and smoking  - Hoarseness of voice: This is resolved after using nasal steroids. However she is having  some worsening spring allergies that is improved by nasal steroid but she's wants better improvement in symptoms. However lower courses of voice only sneezing and itchy eyes  - Smoking: She had a social setback in her family that I suspect involved her daughter and grandson. She does not want to talk about it in front of her 82-year-old grandson. Suffice it to say that discussed a social stress and relapsed her smoking January 2014. However she wants to quit again and is asking for another Chantix prescription  - COPD: This is stable. She spent a lot of time with a 7-year-old grandchild and if which is more active. Therefore COPD cat score is improved to 14. She continues with Symbicort. She's agreeable to attend pulmonary rehabilitation  OV 08/29/2013  SMoking:   reports that she quit smoking about 5 months ago. Her smoking use included Cigarettes. She has a 45 pack-year smoking history. She has never used smokeless tobacco.  COPD:: Since I last saw her in April 2014. She had a flareup of COPD in September 2014 and was treated with antibiotics not otherwise specified and 1 injection of steroid by her primary care physician. After that was doing well but just over a week ago started having worsening cough, wheeze and shortness of breath and was started on Levaquin on 08/23/2013 and was advised to take it for 10 days. He's had partial improvement but still has chest tightness. She still continues with Levaquin. She has not taken prednisone although  she feels that she could benefit potentially from this. She is open to being a research subject on copd trials  Hoarseness of voice: Resolved     CAT COPD Symptom and Quality of Life Score ([employer]) 03/03/12 06/26/2012  02/07/2013   Never Cough -> Cough all the time 4 3 3   No phlegm in chest -> Chest is full of phlegm 3 2 3   No chest tightness -> Chest feels very tight 2 1 1   No dyspnea for 1 flight stairs/hill -> Very dyspneic for 1  flight of stairs 4 4 3   No limitations for ADL at home -> Very limited with ADL at home 3 2 2   Confident leaving home -> Not at all confident leaving home 0 0 0  Sleep soundly -> Do not sleep soundly because of lung condition 1 0 0  Lots of Energy -> No energy at all 4 3 2   TOTAL Score (max 40)  21 15 14       Past, Family, Social reviewed: 64-year-old grandchild diagnosed with rare spinal and brain cancer     Review of Systems  Constitutional: Negative for fever and unexpected weight change.  HENT: Positive for congestion. Negative for dental problem, ear pain, nosebleeds, postnasal drip, rhinorrhea, sinus pressure, sneezing, sore throat and trouble swallowing.   Eyes: Negative for redness and itching.  Respiratory: Positive for cough, chest tightness and shortness of breath. Negative for wheezing.   Cardiovascular: Negative for palpitations and leg swelling.  Gastrointestinal: Negative for nausea and vomiting.  Genitourinary: Negative for dysuria.  Musculoskeletal: Negative for joint swelling.  Skin: Negative for rash.  Neurological: Negative for headaches.  Hematological: Does not bruise/bleed easily.  Psychiatric/Behavioral: The patient is nervous/anxious.        Objective:   Physical Exam  Vitals reviewed. Constitutional: She is oriented to person, place, and time. She appears well-developed and well-nourished. No distress.  HENT:  Head: Normocephalic and atraumatic.  Right Ear: External ear normal.  Left Ear: External ear normal.  Mouth/Throat: Oropharynx is clear and moist. No oropharyngeal exudate.  Eyes: Conjunctivae and EOM are normal. Pupils are equal, round, and reactive to light. Right eye exhibits no discharge. Left eye exhibits no discharge. No scleral icterus.  Neck: Normal range of motion. Neck supple. No JVD present. No tracheal deviation present. No thyromegaly present.  Cardiovascular: Normal rate, regular rhythm, normal heart sounds and intact distal  pulses.  Exam reveals no gallop and no friction rub.   No murmur heard. Pulmonary/Chest: Effort normal and breath sounds normal. No respiratory distress. She has no wheezes. She has no rales. She exhibits no tenderness.  Abdominal: Soft. Bowel sounds are normal. She exhibits no distension and no mass. There is no tenderness. There is no rebound and no guarding.  Musculoskeletal: Normal range of motion. She exhibits no edema and no tenderness.  Lymphadenopathy:    She has no cervical adenopathy.  Neurological: She is alert and oriented to person, place, and time. She has normal reflexes. No cranial nerve deficit. She exhibits normal muscle tone. Coordination normal.  Skin: Skin is warm and dry. No rash noted. She is not diaphoretic. No erythema. No pallor.  Psychiatric: She has a normal mood and affect. Her behavior is normal. Judgment and thought content normal.          Assessment & Plan:

## 2013-09-09 ENCOUNTER — Encounter: Payer: Self-pay | Admitting: Internal Medicine

## 2013-09-09 NOTE — Assessment & Plan Note (Signed)
#  COPD  - glad you had flu shot  - continue your symbicort; will do script - will evaluate you for IMPACT and GALATHEA studies (research)  #followup  6 mnths or sooner if neeed CAT score at followup

## 2013-09-09 NOTE — Assessment & Plan Note (Signed)
#  COPD exacerbation - in flare up - stop your leavaquin that you started 08/23/13 - Please take prednisone 40 mg x1 day, then 30 mg x1 day, then 20 mg x1 day, then 10 mg x1 day, and then 5 mg x1 day and stop     #Smoking   - glad you quit  #followup  6 mnths or sooner if neeed CAT score at followup

## 2013-09-26 ENCOUNTER — Other Ambulatory Visit: Payer: Self-pay | Admitting: Internal Medicine

## 2013-10-05 ENCOUNTER — Ambulatory Visit (HOSPITAL_COMMUNITY)
Admission: RE | Admit: 2013-10-05 | Discharge: 2013-10-05 | Disposition: A | Discharge status: Home / Self Care | Payer: Medicare Other | Source: Ambulatory Visit | Attending: Internal Medicine | Admitting: Internal Medicine

## 2013-10-05 ENCOUNTER — Encounter (HOSPITAL_COMMUNITY): Payer: Self-pay

## 2013-10-05 ENCOUNTER — Other Ambulatory Visit (HOSPITAL_BASED_OUTPATIENT_CLINIC_OR_DEPARTMENT_OTHER): Payer: Medicare Other

## 2013-10-05 DIAGNOSIS — T66XXXA Radiation sickness, unspecified, initial encounter: Secondary | ICD-10-CM | POA: Insufficient documentation

## 2013-10-05 DIAGNOSIS — C3490 Malignant neoplasm of unspecified part of unspecified bronchus or lung: Secondary | ICD-10-CM

## 2013-10-05 DIAGNOSIS — I700 Atherosclerosis of aorta: Secondary | ICD-10-CM | POA: Insufficient documentation

## 2013-10-05 DIAGNOSIS — Y842 Radiological procedure and radiotherapy as the cause of abnormal reaction of the patient, or of later complication, without mention of misadventure at the time of the procedure: Secondary | ICD-10-CM | POA: Insufficient documentation

## 2013-10-05 DIAGNOSIS — E278 Other specified disorders of adrenal gland: Secondary | ICD-10-CM | POA: Insufficient documentation

## 2013-10-05 DIAGNOSIS — I2510 Atherosclerotic heart disease of native coronary artery without angina pectoris: Secondary | ICD-10-CM | POA: Insufficient documentation

## 2013-10-05 DIAGNOSIS — K7689 Other specified diseases of liver: Secondary | ICD-10-CM | POA: Insufficient documentation

## 2013-10-05 DIAGNOSIS — C3410 Malignant neoplasm of upper lobe, unspecified bronchus or lung: Secondary | ICD-10-CM

## 2013-10-05 DIAGNOSIS — R599 Enlarged lymph nodes, unspecified: Secondary | ICD-10-CM | POA: Insufficient documentation

## 2013-10-05 LAB — COMPREHENSIVE METABOLIC PANEL (CC13)
ALT: 19 U/L (ref 0–55)
AST: 15 U/L (ref 5–34)
Albumin: 3.8 g/dL (ref 3.5–5.0)
Calcium: 9 mg/dL (ref 8.4–10.4)
Chloride: 101 mEq/L (ref 98–109)
Potassium: 4 mEq/L (ref 3.5–5.1)

## 2013-10-05 LAB — CBC WITH DIFFERENTIAL/PLATELET
Basophils Absolute: 0.1 10*3/uL (ref 0.0–0.1)
EOS%: 3 % (ref 0.0–7.0)
MCH: 28.2 pg (ref 25.1–34.0)
MCHC: 33.2 g/dL (ref 31.5–36.0)
MCV: 84.9 fL (ref 79.5–101.0)
MONO%: 7.9 % (ref 0.0–14.0)
RBC: 4.64 10*6/uL (ref 3.70–5.45)
RDW: 13.9 % (ref 11.2–14.5)

## 2013-10-05 MED ORDER — IOHEXOL 300 MG/ML  SOLN
80.0000 mL | Freq: Once | INTRAMUSCULAR | Status: AC | PRN
  Administered 2013-10-05: 80 mL via INTRAVENOUS

## 2013-10-07 ENCOUNTER — Encounter (HOSPITAL_COMMUNITY): Payer: Self-pay | Admitting: Emergency Medicine

## 2013-10-07 ENCOUNTER — Emergency Department (HOSPITAL_COMMUNITY)
Admission: EM | Admit: 2013-10-07 | Discharge: 2013-10-07 | Disposition: A | Discharge status: Home / Self Care | Payer: No Typology Code available for payment source | Attending: Emergency Medicine | Admitting: Emergency Medicine

## 2013-10-07 DIAGNOSIS — E119 Type 2 diabetes mellitus without complications: Secondary | ICD-10-CM | POA: Insufficient documentation

## 2013-10-07 DIAGNOSIS — J449 Chronic obstructive pulmonary disease, unspecified: Secondary | ICD-10-CM | POA: Insufficient documentation

## 2013-10-07 DIAGNOSIS — Y92410 Unspecified street and highway as the place of occurrence of the external cause: Secondary | ICD-10-CM | POA: Insufficient documentation

## 2013-10-07 DIAGNOSIS — Y9389 Activity, other specified: Secondary | ICD-10-CM | POA: Insufficient documentation

## 2013-10-07 DIAGNOSIS — R000 Tachycardia, unspecified: Secondary | ICD-10-CM | POA: Insufficient documentation

## 2013-10-07 DIAGNOSIS — F411 Generalized anxiety disorder: Secondary | ICD-10-CM | POA: Insufficient documentation

## 2013-10-07 DIAGNOSIS — Z79899 Other long term (current) drug therapy: Secondary | ICD-10-CM | POA: Insufficient documentation

## 2013-10-07 DIAGNOSIS — Z923 Personal history of irradiation: Secondary | ICD-10-CM | POA: Insufficient documentation

## 2013-10-07 DIAGNOSIS — E039 Hypothyroidism, unspecified: Secondary | ICD-10-CM | POA: Insufficient documentation

## 2013-10-07 DIAGNOSIS — S0990XA Unspecified injury of head, initial encounter: Secondary | ICD-10-CM | POA: Insufficient documentation

## 2013-10-07 DIAGNOSIS — Z880 Allergy status to penicillin: Secondary | ICD-10-CM | POA: Insufficient documentation

## 2013-10-07 DIAGNOSIS — S6990XA Unspecified injury of unspecified wrist, hand and finger(s), initial encounter: Secondary | ICD-10-CM | POA: Insufficient documentation

## 2013-10-07 DIAGNOSIS — M129 Arthropathy, unspecified: Secondary | ICD-10-CM | POA: Insufficient documentation

## 2013-10-07 DIAGNOSIS — Z85118 Personal history of other malignant neoplasm of bronchus and lung: Secondary | ICD-10-CM | POA: Insufficient documentation

## 2013-10-07 DIAGNOSIS — S59909A Unspecified injury of unspecified elbow, initial encounter: Secondary | ICD-10-CM | POA: Insufficient documentation

## 2013-10-07 DIAGNOSIS — Z87891 Personal history of nicotine dependence: Secondary | ICD-10-CM | POA: Insufficient documentation

## 2013-10-07 DIAGNOSIS — Z7982 Long term (current) use of aspirin: Secondary | ICD-10-CM | POA: Insufficient documentation

## 2013-10-07 DIAGNOSIS — J4489 Other specified chronic obstructive pulmonary disease: Secondary | ICD-10-CM | POA: Insufficient documentation

## 2013-10-07 DIAGNOSIS — S0993XA Unspecified injury of face, initial encounter: Secondary | ICD-10-CM | POA: Insufficient documentation

## 2013-10-07 DIAGNOSIS — IMO0002 Reserved for concepts with insufficient information to code with codable children: Secondary | ICD-10-CM | POA: Insufficient documentation

## 2013-10-07 MED ORDER — METHOCARBAMOL 500 MG PO TABS: 500.0000 mg | ORAL_TABLET | Freq: Two times a day (BID) | ORAL | Status: DC

## 2013-10-07 MED ORDER — TRAMADOL HCL 50 MG PO TABS: 50.0000 mg | ORAL_TABLET | Freq: Four times a day (QID) | ORAL | Status: DC | PRN

## 2013-10-07 NOTE — ED Provider Notes (Signed)
Medical screening examination/treatment/procedure(s) were performed by non-physician practitioner and as supervising physician I was immediately available for consultation/collaboration.  EKG Interpretation   None        Doug Sou, MD 10/07/13 2106
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
## 2013-10-09 ENCOUNTER — Ambulatory Visit (HOSPITAL_BASED_OUTPATIENT_CLINIC_OR_DEPARTMENT_OTHER): Payer: Medicare Other | Admitting: Internal Medicine

## 2013-10-09 ENCOUNTER — Encounter: Payer: Self-pay | Admitting: Internal Medicine

## 2013-10-09 ENCOUNTER — Telehealth: Payer: Self-pay | Admitting: Internal Medicine

## 2013-10-09 VITALS — BP 154/93 | HR 117 | Temp 98.0°F | Resp 18 | Ht 68.0 in | Wt 187.4 lb

## 2013-10-09 DIAGNOSIS — C3490 Malignant neoplasm of unspecified part of unspecified bronchus or lung: Secondary | ICD-10-CM

## 2013-10-09 DIAGNOSIS — R599 Enlarged lymph nodes, unspecified: Secondary | ICD-10-CM

## 2013-10-09 NOTE — Patient Instructions (Signed)
Followup visit in 3 weeks after repeating a PET scan. 

## 2013-10-09 NOTE — Telephone Encounter (Signed)
gv adn printed appt sched and avs forpt for Jan 2015.Marland KitchenMarland Kitchenpt aware cs will call to sched PET

## 2013-10-09 NOTE — Progress Notes (Signed)
[facility] [phone]   [phone]  OFFICE PROGRESS NOTE  Janice Bamberger, NP [address] [name] [address]  DIAGNOSIS: Recurrent non-small cell lung cancer, squamous cell carcinoma initially diagnosis stage IIIa (T1 N2 MX ) in August of 2010.   PRIOR THERAPY:  #1 status post concurrent chemoradiation with weekly carboplatin and paclitaxel, last dose of chemotherapy given 08/05/2011.  #2 status post consolidation chemotherapy with carboplatin paclitaxel last dose given 10/27/2009.  #3 Concurrent chemoradiation with weekly carboplatin for an AUC of 2 and paclitaxel at 45 mg per meter squared given concurrent with radiotherapy, last dose was given 09/20/2011.  #4 Systemic chemotherapy with gemcitabine 1000 mg meter squared on days 1 and 8 every 3 weeks status post 3 cycles with stable disease, last dose was given 12/20/2011.   CURRENT THERAPY: Observation.  INTERVAL HISTORY: Janice Ball 57 y.o. female returns to the clinic today for routine three-month follow up visit. The patient has no complaints today. She denied having any significant weight loss or night sweats. She has no chest pain, shortness of breath, cough or hemoptysis.  In any significant nausea or vomiting, no fever or chills. The patient had repeat CT scan of the chest performed recently and she is here for evaluation and discussion of her scan results.  MEDICAL HISTORY: Past Medical History  Diagnosis Date  . Anxiety   . Hyperlipidemia   . Hypothyroidism   . COPD (chronic obstructive pulmonary disease)   . Arthritis     thumbs  . 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 Lung cancer     lung ca dx 8/10  . Lung cancer   . Diabetes mellitus     ALLERGIES:  is allergic to sulfonamide derivatives; codeine; and penicillins.  MEDICATIONS:  Current Outpatient Prescriptions  Medication Sig Dispense Refill  . albuterol (VENTOLIN HFA) 108 (90 BASE)  MCG/ACT inhaler Inhale 2 puffs into the lungs every 6 (six) hours as needed.  1 Inhaler  6  . aspirin 325 MG tablet Take 325 mg by mouth daily.        . Chromium 1000 MCG TABS Take 1 tablet by mouth daily.        . fish oil-omega-3 fatty acids 1000 MG capsule Take 1 g by mouth daily.      Marland Kitchen glimepiride (AMARYL) 2 MG tablet Take 1 mg by mouth daily before breakfast.      . Guaifenesin (MUCINEX MAXIMUM STRENGTH) 1200 MG TB12 Take 1 tablet by mouth every 12 (twelve) hours as needed.       Marland Kitchen levothyroxine (SYNTHROID, LEVOTHROID) 125 MCG tablet Take 125 mcg by mouth daily.       Marland Kitchen LORazepam (ATIVAN) 1 MG tablet TAKE ONE TABLET BY MOUTH EVERY 6 HOURS AS NEEDED FOR ANXIETY OR NAUSEA  40 tablet  0  . magnesium gluconate (MAGONATE) 500 MG tablet Take 1,000 mg by mouth daily.       . metFORMIN (GLUCOPHAGE) 500 MG tablet Take 1,000 mg by mouth 2 (two) times daily with a meal.       . methocarbamol (ROBAXIN) 500 MG tablet Take 1 tablet (500 mg total) by mouth 2 (two) times daily.  20 tablet  0  . Multiple Vitamins-Minerals (CENTRUM SILVER PO) Take 1 tablet by mouth daily.        . sertraline (ZOLOFT) 100 MG tablet Take 100 mg by mouth daily. Takes 1 1/2 tabs daily for total  dose of 150 mg      . spironolactone (ALDACTONE) 25 MG tablet Take 25 mg by mouth daily.      . SYMBICORT 160-4.5 MCG/ACT inhaler TAKE 2 PUFFS TWICE A DAY  1 Inhaler  6  . traMADol (ULTRAM) 50 MG tablet Take 1 tablet (50 mg total) by mouth every 6 (six) hours as needed.  15 tablet  0  . vitamin B-12 (CYANOCOBALAMIN) 500 MCG tablet Take 500 mcg by mouth daily.        Marland Kitchen ibuprofen (ADVIL,MOTRIN) 200 MG tablet Take 400 mg by mouth 2 (two) times daily as needed (pain).       No current facility-administered medications for this visit.    SURGICAL HISTORY:  Past Surgical History  Procedure Laterality Date  . Total abdominal hysterectomy    . Thyroidectomy, partial    . Tonsillectomy    . Tubal ligation      REVIEW OF SYSTEMS:  A  comprehensive review of systems was negative.   PHYSICAL EXAMINATION: General appearance: alert, cooperative and no distress Head: Normocephalic, without obvious abnormality, atraumatic Neck: no adenopathy, no JVD, supple, symmetrical, trachea midline and thyroid not enlarged, symmetric, no tenderness/mass/nodules Lymph nodes: Cervical, supraclavicular, and axillary nodes normal. Resp: clear to auscultation bilaterally Back: symmetric, no curvature. ROM normal. No CVA tenderness. Cardio: regular rate and rhythm, S1, S2 normal, no murmur, click, rub or gallop GI: soft, non-tender; bowel sounds normal; no masses,  no organomegaly Extremities: extremities normal, atraumatic, no cyanosis or edema  ECOG PERFORMANCE STATUS: 0 - Asymptomatic  Blood pressure 154/93, pulse 117, temperature 98 F (36.7 C), temperature source Oral, resp. rate 18, height 5\' 8"  (1.727 m), weight 187 lb 6.4 oz (85.004 kg).  LABORATORY DATA: Lab Results  Component Value Date   WBC 7.3 10/05/2013   HGB 13.1 10/05/2013   HCT 39.4 10/05/2013   MCV 84.9 10/05/2013   PLT 174 10/05/2013      Chemistry      Component Value Date/Time   NA 139 10/05/2013 1104   NA 136 07/14/2012 1048   NA 141 04/04/2012 0945   K 4.0 10/05/2013 1104   K 4.0 07/14/2012 1048   K 4.7 04/04/2012 0945   CL 101 04/06/2013 1041   CL 102 07/14/2012 1048   CL 100 04/04/2012 0945   CO2 23 10/05/2013 1104   CO2 29 04/04/2012 0945   CO2 19 01/05/2012 1131   BUN 6.5* 10/05/2013 1104   BUN 5* 07/14/2012 1048   BUN 16 04/04/2012 0945   CREATININE 0.8 10/05/2013 1104   CREATININE 0.90 07/14/2012 1048   CREATININE 0.9 04/04/2012 0945      Component Value Date/Time   CALCIUM 9.0 10/05/2013 1104   CALCIUM 8.4 04/04/2012 0945   CALCIUM 8.2* 01/05/2012 1131   ALKPHOS 70 10/05/2013 1104   ALKPHOS 61 04/04/2012 0945   ALKPHOS 64 01/05/2012 1131   AST 15 10/05/2013 1104   AST 31 04/04/2012 0945   AST 37 01/05/2012 1131   ALT 19 10/05/2013 1104   ALT 30  04/04/2012 0945   ALT 56* 01/05/2012 1131   BILITOT 0.36 10/05/2013 1104   BILITOT 0.50 04/04/2012 0945   BILITOT 0.3 01/05/2012 1131       RADIOGRAPHIC STUDIES: Ct Chest W Contrast  10/05/2013   CLINICAL DATA:  Lung cancer.  EXAM: CT CHEST WITH CONTRAST  TECHNIQUE: Multidetector CT imaging of the chest was performed during intravenous contrast administration.  CONTRAST:  80mL OMNIPAQUE IOHEXOL 300 MG/ML  SOLN  COMPARISON:  07/10/2013.  FINDINGS: The chest wall is unremarkable. No breast masses, supraclavicular or axillary lymphadenopathy. Small scattered lymph nodes are stable. The thyroid gland is unremarkable. The right lobe is not identified and may have been previously surgically removed. The bony thorax is intact. No destructive bone lesions or spinal canal compromise. There is a fairly significant thoracic scoliosis.  The heart is normal in size. No pericardial effusion. There are enlarging right pretracheal and subcarinal lymph nodes. The right pretracheal nodal mass measures 22 x 13 mm and previously measured 17 x 10 mm. The largest subcarinal node measures a maximum of 6.6 mm and now measures 10.6 mm. Stable soft tissue thickening and radiation changes involving the right hilar region. I do not see any obvious enlarging soft tissue component. The right peritracheal soft tissue thickening on image number 20 is unchanged at approximately 11 mm. A small infrahilar lymph node on image number 30 is stable.  The aorta is normal in caliber. Stable atherosclerotic calcifications. Stable coronary artery calcifications. The esophagus is grossly normal.  Examination of the lung parenchyma demonstrates stable right paramediastinal radiation changes. No worrisome pulmonary nodules to suggest pulmonary metastatic disease. No pleural effusion. Stable hyperinflation.  The upper abdomen demonstrates diffuse fatty infiltration of the liver but no focal hepatic lesions. The adrenal glands are unremarkable. Stable  small right adrenal gland nodule.  IMPRESSION: 1. Slight interval enlargement of pretracheal and subcarinal lymph nodes somewhat suspicious for metastatic disease. PET-CT may be helpful for further evaluation. 2. Stable appearing radiation changes in the right hilar region without obvious recurrent tumor in this area. 3. No CT findings for metastatic pulmonary disease. 4. Diffuse fatty infiltration of the liver and stable small right adrenal gland nodule.   Electronically Signed   By: Loralie Champagne M.D.   On: 10/05/2013 14:07    ASSESSMENT AND PLAN: this is a very pleasant 57 years old white female with recurrent non-small cell lung cancer status post concurrent chemoradiation as well as consolidation chemotherapy and she is currently on observation.  She has no evidence for disease progression on his recent scan, except for slight interval enlargement of the pretracheal and subcarinal lymph nodes suspicious for metastatic disease. I discussed the scan results with the patient and recommended for her to have a PET scan performed for further evaluation of these areas. I would see her back for followup visit in 3 weeks for evaluation and discussion of the PET scan results as well as further recommendation regarding treatment of her condition. She was advised to call immediately if she has any concerning symptoms in the interval. The patient voices understanding of current disease status and treatment options and is in agreement with the current care plan.  All questions were answered. The patient knows to call the clinic with any problems, questions or concerns. We can certainly see the patient much sooner if necessary.

## 2013-10-11 ENCOUNTER — Emergency Department (HOSPITAL_COMMUNITY)
Admission: EM | Admit: 2013-10-11 | Discharge: 2013-10-12 | Disposition: A | Discharge status: Home / Self Care | Payer: Medicare Other | Attending: Emergency Medicine | Admitting: Emergency Medicine

## 2013-10-11 ENCOUNTER — Encounter (HOSPITAL_COMMUNITY): Payer: Self-pay | Admitting: Emergency Medicine

## 2013-10-11 ENCOUNTER — Emergency Department (HOSPITAL_COMMUNITY): Payer: Medicare Other

## 2013-10-11 DIAGNOSIS — Z85118 Personal history of other malignant neoplasm of bronchus and lung: Secondary | ICD-10-CM | POA: Insufficient documentation

## 2013-10-11 DIAGNOSIS — J4489 Other specified chronic obstructive pulmonary disease: Secondary | ICD-10-CM | POA: Insufficient documentation

## 2013-10-11 DIAGNOSIS — E039 Hypothyroidism, unspecified: Secondary | ICD-10-CM | POA: Insufficient documentation

## 2013-10-11 DIAGNOSIS — IMO0002 Reserved for concepts with insufficient information to code with codable children: Secondary | ICD-10-CM | POA: Insufficient documentation

## 2013-10-11 DIAGNOSIS — R000 Tachycardia, unspecified: Secondary | ICD-10-CM | POA: Insufficient documentation

## 2013-10-11 DIAGNOSIS — E119 Type 2 diabetes mellitus without complications: Secondary | ICD-10-CM | POA: Insufficient documentation

## 2013-10-11 DIAGNOSIS — Z79899 Other long term (current) drug therapy: Secondary | ICD-10-CM | POA: Insufficient documentation

## 2013-10-11 DIAGNOSIS — W19XXXA Unspecified fall, initial encounter: Secondary | ICD-10-CM

## 2013-10-11 DIAGNOSIS — M129 Arthropathy, unspecified: Secondary | ICD-10-CM | POA: Insufficient documentation

## 2013-10-11 DIAGNOSIS — J449 Chronic obstructive pulmonary disease, unspecified: Secondary | ICD-10-CM | POA: Insufficient documentation

## 2013-10-11 DIAGNOSIS — Z880 Allergy status to penicillin: Secondary | ICD-10-CM | POA: Insufficient documentation

## 2013-10-11 DIAGNOSIS — E785 Hyperlipidemia, unspecified: Secondary | ICD-10-CM | POA: Insufficient documentation

## 2013-10-11 DIAGNOSIS — F411 Generalized anxiety disorder: Secondary | ICD-10-CM | POA: Insufficient documentation

## 2013-10-11 DIAGNOSIS — M25561 Pain in right knee: Secondary | ICD-10-CM

## 2013-10-11 DIAGNOSIS — M79601 Pain in right arm: Secondary | ICD-10-CM

## 2013-10-11 DIAGNOSIS — Z87891 Personal history of nicotine dependence: Secondary | ICD-10-CM | POA: Insufficient documentation

## 2013-10-11 DIAGNOSIS — Y92009 Unspecified place in unspecified non-institutional (private) residence as the place of occurrence of the external cause: Secondary | ICD-10-CM | POA: Insufficient documentation

## 2013-10-11 DIAGNOSIS — W010XXA Fall on same level from slipping, tripping and stumbling without subsequent striking against object, initial encounter: Secondary | ICD-10-CM | POA: Insufficient documentation

## 2013-10-11 DIAGNOSIS — Z7982 Long term (current) use of aspirin: Secondary | ICD-10-CM | POA: Insufficient documentation

## 2013-10-11 DIAGNOSIS — Z23 Encounter for immunization: Secondary | ICD-10-CM | POA: Insufficient documentation

## 2013-10-11 DIAGNOSIS — Y9301 Activity, walking, marching and hiking: Secondary | ICD-10-CM | POA: Insufficient documentation

## 2013-10-11 DIAGNOSIS — Z923 Personal history of irradiation: Secondary | ICD-10-CM | POA: Insufficient documentation
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
## 2013-10-11 NOTE — ED Notes (Addendum)
Patient states she fell last night, tripped outside. C/o pain to R side, shoulder, arm, elbow, knee, and toes. Patient denies hitting her head but has been more lethargic than normal and per her family difficult to arouse.

## 2013-10-11 NOTE — ED Notes (Addendum)
Pt presents after fall last night in back yard. Pt. sts she was walking in her back yard and tripped over tree roots and fell on her R side. Pt has abrasions and bruising on R big toe, R knee, R elbow and R shoulder. Pt c/o generalized pain on right side. Pt. sts her family got worried because she seemed sleepy and lethargic. Family told pt. That they tried to get her up four times before dinner before they were successful. Pt. C/o nausea this morning and dizziness this afternoon but neither at the current time. Pt denies double vision or blurry vision. Pt. Ambulated to Room. Pt has hx of lung cancer and is currently in remission.

## 2013-10-11 NOTE — ED Notes (Signed)
Patient transported to X-ray 

## 2013-10-12 MED ORDER — TETANUS-DIPHTH-ACELL PERTUSSIS 5-2.5-18.5 LF-MCG/0.5 IM SUSP
0.5000 mL | Freq: Once | INTRAMUSCULAR | Status: AC
  Administered 2013-10-12: 0.5 mL via INTRAMUSCULAR
  Filled 2013-10-12: qty 0.5

## 2013-10-12 NOTE — ED Notes (Signed)
Patient transported to CT 

## 2013-10-12 NOTE — ED Provider Notes (Signed)
Medical screening examination/treatment/procedure(s) were performed by non-physician practitioner and as supervising physician I was immediately available for consultation/collaboration.  EKG Interpretation   None         Hanley Seamen, MD 10/12/13 0101

## 2013-10-24 ENCOUNTER — Ambulatory Visit (HOSPITAL_COMMUNITY)
Admission: RE | Admit: 2013-10-24 | Discharge: 2013-10-24 | Disposition: A | Discharge status: Home / Self Care | Payer: Medicare Other | Source: Ambulatory Visit | Attending: Internal Medicine | Admitting: Internal Medicine

## 2013-10-24 ENCOUNTER — Encounter (HOSPITAL_COMMUNITY): Payer: Self-pay

## 2013-10-24 DIAGNOSIS — X58XXXA Exposure to other specified factors, initial encounter: Secondary | ICD-10-CM | POA: Insufficient documentation

## 2013-10-24 DIAGNOSIS — S2239XA Fracture of one rib, unspecified side, initial encounter for closed fracture: Secondary | ICD-10-CM | POA: Insufficient documentation

## 2013-10-24 DIAGNOSIS — I2510 Atherosclerotic heart disease of native coronary artery without angina pectoris: Secondary | ICD-10-CM | POA: Insufficient documentation

## 2013-10-24 DIAGNOSIS — K838 Other specified diseases of biliary tract: Secondary | ICD-10-CM | POA: Insufficient documentation

## 2013-10-24 DIAGNOSIS — I700 Atherosclerosis of aorta: Secondary | ICD-10-CM | POA: Insufficient documentation

## 2013-10-24 DIAGNOSIS — C3490 Malignant neoplasm of unspecified part of unspecified bronchus or lung: Secondary | ICD-10-CM | POA: Insufficient documentation

## 2013-10-24 DIAGNOSIS — K7689 Other specified diseases of liver: Secondary | ICD-10-CM | POA: Insufficient documentation

## 2013-10-24 LAB — GLUCOSE, CAPILLARY: Glucose-Capillary: 171 mg/dL — ABNORMAL HIGH (ref 70–99)

## 2013-10-24 MED ORDER — FLUDEOXYGLUCOSE F - 18 (FDG) INJECTION
18.4000 | Freq: Once | INTRAVENOUS | Status: AC | PRN
  Administered 2013-10-24: 18.4 via INTRAVENOUS

## 2013-11-01 ENCOUNTER — Ambulatory Visit (HOSPITAL_BASED_OUTPATIENT_CLINIC_OR_DEPARTMENT_OTHER): Payer: Medicare Other | Admitting: Internal Medicine

## 2013-11-01 ENCOUNTER — Encounter: Payer: Self-pay | Admitting: Internal Medicine

## 2013-11-01 ENCOUNTER — Telehealth: Payer: Self-pay | Admitting: Internal Medicine

## 2013-11-01 VITALS — BP 143/66 | HR 120 | Temp 97.6°F | Resp 20 | Ht 68.0 in | Wt 189.4 lb

## 2013-11-01 DIAGNOSIS — C3490 Malignant neoplasm of unspecified part of unspecified bronchus or lung: Secondary | ICD-10-CM

## 2013-11-01 DIAGNOSIS — C778 Secondary and unspecified malignant neoplasm of lymph nodes of multiple regions: Secondary | ICD-10-CM

## 2013-11-01 DIAGNOSIS — C3410 Malignant neoplasm of upper lobe, unspecified bronchus or lung: Secondary | ICD-10-CM

## 2013-11-01 MED ORDER — LIDOCAINE-PRILOCAINE 2.5-2.5 % EX CREA: 1.0000 | TOPICAL_CREAM | CUTANEOUS | Status: DC | PRN

## 2013-11-01 NOTE — Progress Notes (Signed)
[facility] [phone]   Fax:([phone]) [phone]  OFFICE PROGRESS NOTE  [name], NP [place] [address]  DIAGNOSIS: Recurrent non-small cell lung cancer, squamous cell carcinoma initially diagnosis stage IIIa (T1 N2 MX ) in August of 2010.   PRIOR THERAPY:  #1 status post concurrent chemoradiation with weekly carboplatin and paclitaxel, last dose of chemotherapy given 08/05/2011.  #2 status post consolidation chemotherapy with carboplatin paclitaxel last dose given 10/27/2009.  #3 Concurrent chemoradiation with weekly carboplatin for an AUC of 2 and paclitaxel at 45 mg per meter squared given concurrent with radiotherapy, last dose was given 09/20/2011.  #4 Systemic chemotherapy with gemcitabine 1000 mg meter squared on days 1 and 8 every 3 weeks status post 3 cycles with stable disease, last dose was given 12/20/2011.   CURRENT THERAPY: Systemic chemotherapy again with single agent gemcitabine 1000 mg/M2 on days 1 and 8 every 3 weeks. First cycle 11/07/2013.  INTERVAL HISTORY: Janice Ball 58 y.o. female returns to the clinic today for routine three-month follow up visit. She was a little bit upset and emotional today thinking about her grandson who is about to start systemic therapy for recently diagnosed cancer. The patient has no complaints today. She denied having any significant weight loss or night sweats. She has no chest pain, shortness of breath, cough or hemoptysis.  In any significant nausea or vomiting, no fever or chills. The patient had repeat CT scan of the chest performed recently which showed disease progression and the right precarinal and subcarinal lymph nodes. I ordered a PET scan and she is here today for evaluation and discussion of her scan results.  MEDICAL HISTORY: Past Medical History  Diagnosis Date  . Anxiety   . Hyperlipidemia   . Hypothyroidism   . COPD (chronic obstructive pulmonary disease)     . Arthritis     thumbs  . 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 Lung cancer     lung ca dx 8/10  . Lung cancer   . Diabetes mellitus     ALLERGIES:  is allergic to sulfonamide derivatives; codeine; and penicillins.  MEDICATIONS:  Current Outpatient Prescriptions  Medication Sig Dispense Refill  . albuterol (VENTOLIN HFA) 108 (90 BASE) MCG/ACT inhaler Inhale 2 puffs into the lungs every 6 (six) hours as needed.  1 Inhaler  6  . aspirin 325 MG tablet Take 325 mg by mouth daily.        . Chromium 1000 MCG TABS Take 1 tablet by mouth daily.        . fish oil-omega-3 fatty acids 1000 MG capsule Take 1 g by mouth daily.      Marland Kitchen glimepiride (AMARYL) 2 MG tablet Take 1 mg by mouth daily before breakfast.      . Guaifenesin (MUCINEX MAXIMUM STRENGTH) 1200 MG TB12 Take 1 tablet by mouth every 12 (twelve) hours as needed. For cough      . ibuprofen (ADVIL,MOTRIN) 200 MG tablet Take 400 mg by mouth 2 (two) times daily as needed (pain).      Marland Kitchen levothyroxine (SYNTHROID, LEVOTHROID) 125 MCG tablet Take 125 mcg by mouth daily.       Marland Kitchen LORazepam (ATIVAN) 1 MG tablet TAKE ONE TABLET BY MOUTH EVERY 6 HOURS AS NEEDED FOR ANXIETY OR NAUSEA  40 tablet  0  . magnesium gluconate (MAGONATE) 500 MG tablet Take 1,000 mg by mouth daily.       Marland Kitchen  metFORMIN (GLUCOPHAGE) 500 MG tablet Take 1,000 mg by mouth 2 (two) times daily with a meal.       . Multiple Vitamins-Minerals (CENTRUM SILVER PO) Take 1 tablet by mouth daily.        . sertraline (ZOLOFT) 100 MG tablet Take 150 mg by mouth daily. Takes 1 1/2 tabs daily for total dose of 150 mg      . spironolactone (ALDACTONE) 25 MG tablet Take 25 mg by mouth daily.      . SYMBICORT 160-4.5 MCG/ACT inhaler TAKE 2 PUFFS TWICE A DAY  1 Inhaler  6  . vitamin B-12 (CYANOCOBALAMIN) 500 MCG tablet Take 500 mcg by mouth daily.         No current facility-administered medications for this visit.    SURGICAL HISTORY:  Past Surgical History  Procedure  Laterality Date  . Total abdominal hysterectomy    . Thyroidectomy, partial    . Tonsillectomy    . Tubal ligation      REVIEW OF SYSTEMS:  Constitutional: negative Eyes: negative Ears, nose, mouth, throat, and face: negative Respiratory: negative Cardiovascular: negative Gastrointestinal: negative Genitourinary:negative Integument/breast: negative Hematologic/lymphatic: negative Musculoskeletal:negative Neurological: negative Behavioral/Psych: negative Endocrine: negative Allergic/Immunologic: negative   PHYSICAL EXAMINATION: General appearance: alert, cooperative and no distress Head: Normocephalic, without obvious abnormality, atraumatic Neck: no adenopathy, no JVD, supple, symmetrical, trachea midline and thyroid not enlarged, symmetric, no tenderness/mass/nodules Lymph nodes: Cervical, supraclavicular, and axillary nodes normal. Resp: clear to auscultation bilaterally Back: symmetric, no curvature. ROM normal. No CVA tenderness. Cardio: regular rate and rhythm, S1, S2 normal, no murmur, click, rub or gallop GI: soft, non-tender; bowel sounds normal; no masses,  no organomegaly Extremities: extremities normal, atraumatic, no cyanosis or edema Neurologic: Alert and oriented X 3, normal strength and tone. Normal symmetric reflexes. Normal coordination and gait  ECOG PERFORMANCE STATUS: 0 - Asymptomatic  Blood pressure 143/66, pulse 120, temperature 97.6 F (36.4 C), temperature source Oral, resp. rate 20, height 5\' 8"  (1.727 m), weight 189 lb 6.4 oz (85.911 kg).  LABORATORY DATA: Lab Results  Component Value Date   WBC 7.3 10/05/2013   HGB 13.1 10/05/2013   HCT 39.4 10/05/2013   MCV 84.9 10/05/2013   PLT 174 10/05/2013      Chemistry      Component Value Date/Time   NA 139 10/05/2013 1104   NA 136 07/14/2012 1048   NA 141 04/04/2012 0945   K 4.0 10/05/2013 1104   K 4.0 07/14/2012 1048   K 4.7 04/04/2012 0945   CL 101 04/06/2013 1041   CL 102 07/14/2012 1048   CL  100 04/04/2012 0945   CO2 23 10/05/2013 1104   CO2 29 04/04/2012 0945   CO2 19 01/05/2012 1131   BUN 6.5* 10/05/2013 1104   BUN 5* 07/14/2012 1048   BUN 16 04/04/2012 0945   CREATININE 0.8 10/05/2013 1104   CREATININE 0.90 07/14/2012 1048   CREATININE 0.9 04/04/2012 0945      Component Value Date/Time   CALCIUM 9.0 10/05/2013 1104   CALCIUM 8.4 04/04/2012 0945   CALCIUM 8.2* 01/05/2012 1131   ALKPHOS 70 10/05/2013 1104   ALKPHOS 61 04/04/2012 0945   ALKPHOS 64 01/05/2012 1131   AST 15 10/05/2013 1104   AST 31 04/04/2012 0945   AST 37 01/05/2012 1131   ALT 19 10/05/2013 1104   ALT 30 04/04/2012 0945   ALT 56* 01/05/2012 1131   BILITOT 0.36 10/05/2013 1104   BILITOT 0.50 04/04/2012 0945  BILITOT 0.3 01/05/2012 1131       RADIOGRAPHIC STUDIES: Dg Shoulder Right  10/11/2013   CLINICAL DATA:  Fall.  Right shoulder pain.  EXAM: RIGHT SHOULDER - 2+ VIEW  COMPARISON:  None.  FINDINGS: Glenohumeral joint is located. There is no fracture. The to acromion. Mild AC joint osteoarthritis with undersurface spurring. Visualized right chest appears within normal limits. Deformity of the midshaft of the right clavicle is compatible with an old healed fracture.  IMPRESSION: No acute osseous abnormality.  Mild right AC joint osteoarthritis.   Electronically Signed   By: Dereck Ligas M.D.   On: 10/11/2013 23:23   Dg Elbow Complete Right  10/11/2013   CLINICAL DATA:  Fall.  Right elbow pain.  EXAM: RIGHT ELBOW - COMPLETE 3+ VIEW  COMPARISON:  None.  FINDINGS: Anatomic alignment. No fracture. No effusion. Soft tissues appear normal.  IMPRESSION: Negative.   Electronically Signed   By: Dereck Ligas M.D.   On: 10/11/2013 23:22   Ct Head Wo Contrast  10/12/2013   CLINICAL DATA:  Fall on right side, lethargy and dizziness. History of lung cancer.  EXAM: CT HEAD WITHOUT CONTRAST  TECHNIQUE: Contiguous axial images were obtained from the base of the skull through the vertex without intravenous contrast.   COMPARISON:  MRI of the brain August 13, 2011 and CT of the head June 20, 2009  FINDINGS: The ventricles and sulci are normal. No intraparenchymal hemorrhage, mass effect nor midline shift. No acute large vascular territory infarcts. No abnormal extra-axial fluid collections. Basal cisterns are patent.  No skull fracture. Mild calcific atherosclerosis of the carotid siphons. Right maxillary mucoperiosteal reaction without paranasal sinus air-fluid levels. Mild left temporomandibular osteoarthrosis. The included ocular globes and orbital contents are non-suspicious.  IMPRESSION: No acute intracranial process.   Electronically Signed   By: Elon Alas   On: 10/12/2013 00:00   Ct Chest W Contrast  10/05/2013   CLINICAL DATA:  Lung cancer.  EXAM: CT CHEST WITH CONTRAST  TECHNIQUE: Multidetector CT imaging of the chest was performed during intravenous contrast administration.  CONTRAST:  62mL OMNIPAQUE IOHEXOL 300 MG/ML  SOLN  COMPARISON:  07/10/2013.  FINDINGS: The chest wall is unremarkable. No breast masses, supraclavicular or axillary lymphadenopathy. Small scattered lymph nodes are stable. The thyroid gland is unremarkable. The right lobe is not identified and may have been previously surgically removed. The bony thorax is intact. No destructive bone lesions or spinal canal compromise. There is a fairly significant thoracic scoliosis.  The heart is normal in size. No pericardial effusion. There are enlarging right pretracheal and subcarinal lymph nodes. The right pretracheal nodal mass measures 22 x 13 mm and previously measured 17 x 10 mm. The largest subcarinal node measures a maximum of 6.6 mm and now measures 10.6 mm. Stable soft tissue thickening and radiation changes involving the right hilar region. I do not see any obvious enlarging soft tissue component. The right peritracheal soft tissue thickening on image number 20 is unchanged at approximately 11 mm. A small infrahilar lymph node on image  number 30 is stable.  The aorta is normal in caliber. Stable atherosclerotic calcifications. Stable coronary artery calcifications. The esophagus is grossly normal.  Examination of the lung parenchyma demonstrates stable right paramediastinal radiation changes. No worrisome pulmonary nodules to suggest pulmonary metastatic disease. No pleural effusion. Stable hyperinflation.  The upper abdomen demonstrates diffuse fatty infiltration of the liver but no focal hepatic lesions. The adrenal glands are unremarkable. Stable small right adrenal  gland nodule.  IMPRESSION: 1. Slight interval enlargement of pretracheal and subcarinal lymph nodes somewhat suspicious for metastatic disease. PET-CT may be helpful for further evaluation. 2. Stable appearing radiation changes in the right hilar region without obvious recurrent tumor in this area. 3. No CT findings for metastatic pulmonary disease. 4. Diffuse fatty infiltration of the liver and stable small right adrenal gland nodule.   Electronically Signed   By: Kalman Jewels M.D.   On: 10/05/2013 14:07   Nm Pet Image Restag (ps) Skull Base To Thigh  10/24/2013   CLINICAL DATA:  Subsequent treatment strategy for lung cancer.  EXAM: NUCLEAR MEDICINE PET SKULL BASE TO THIGH  FASTING BLOOD GLUCOSE:  Value:  171 mg/dl  TECHNIQUE: 18.4 mCi F-18 FDG was injected intravenously. CT data was obtained and used for attenuation correction and anatomic localization only. (This was not acquired as a diagnostic CT examination.) Additional exam technical data entered on technologist worksheet.  COMPARISON:  CT chest dated 10/05/2013.  PET-CT dated 07/13/2011.  FINDINGS: NECK  No hypermetabolic lymph nodes in the neck.  Hypermetabolism involving the left lower thyroid gland, max SUV 5.3 (PET image 48), nonspecific.  CHEST  Radiation changes in the right upper lobe/perihilar region. No associated hypermetabolism.  Mild emphysematous changes.  Two right paratracheal nodes measuring 10 mm short  axis (series 2/image 35), max SUV 7.2, suspicious for nodal recurrence/metastases.  8 mm short axis subcarinal node (series 2/image 83), non FDG avid.  The heart is normal in size. Coronary atherosclerosis. Atherosclerotic calcifications of the aortic arch.  ABDOMEN/PELVIS  No abnormal hypermetabolic activity within the liver, pancreas, adrenal glands, or spleen.  Layering gallbladder sludge and/or noncalcified gallstones (series 2/image 155). Hepatic steatosis with focal fatty sparing along the gallbladder fossa. Atherosclerotic calcifications of the abdominal aorta and branch vessels.  No hypermetabolic lymph nodes in the abdomen or pelvis.  Prior hysterectomy.  SKELETON  Right lateral 4th rib fracture (series 2/image 90) with associated hypermetabolism, max SUV 3.4. This appearance is new from recent CT. No associated soft tissue mass to suggest a pathologic fracture.  No focal hypermetabolic activity to suggest skeletal metastasis.  IMPRESSION: Radiation changes in the right upper lobe/perihilar region.  Two right paratracheal nodes measuring up to 10 mm short axis, max SUV 7.2, suspicious for nodal recurrence/metastases.  Right lateral 4th rib fracture, new.  Hypermetabolism involving the left lower thyroid gland, max SUV 5.3, nonspecific. Consider thyroid ultrasound for further evaluation as clinically warranted.   Electronically Signed   By: Julian Hy M.D.   On: 10/24/2013 14:57   Dg Knee Complete 4 Views Right  10/11/2013   CLINICAL DATA:  Fall.  Right knee pain.  EXAM: RIGHT KNEE - COMPLETE 4+ VIEW  COMPARISON:  None.  FINDINGS: There is no evidence of fracture, dislocation, or joint effusion. There is no evidence of arthropathy or other focal bone abnormality. Soft tissues are unremarkable.  IMPRESSION: Negative.   Electronically Signed   By: Dereck Ligas M.D.   On: 10/11/2013 23:22   Dg Foot Complete Right  10/11/2013   CLINICAL DATA:  Fall.  Right foot pain.  EXAM: RIGHT FOOT COMPLETE  - 3+ VIEW  COMPARISON:  None.  FINDINGS: Osteopenia. There is no displaced fracture. Calcaneal spurs incidentally noted. Anatomic alignment.  IMPRESSION: Negative.   Electronically Signed   By: Dereck Ligas M.D.   On: 10/11/2013 23:21   ASSESSMENT AND PLAN: This is a very pleasant 58 years old white female with recurrent non-small cell  lung cancer status post concurrent chemoradiation as well as consolidation chemotherapy and she is currently on observation.  The recent CT scan of the chest as well as a PET scan showed evidence for disease recurrence in the mediastinum. I have a lengthy discussion with the patient today about her condition. I gave her the option of systemic chemotherapy again with single agent gemcitabine versus consideration of enrollment and immunotherapy clinical trial with Nivolumab. The patient has no issues with her previous treatment with gemcitabine and she has good response to this treatment. She would like to resume her systemic chemotherapy with gemcitabine again. I reminded the patient was adverse effect of this treatment including but not limited to alopecia, myelosuppression, nausea and vomiting, liver or renal dysfunction as well as fatigue. She would like to proceed with treatment as planned. Her first dose will be on 11/07/2013. The patient would come back for follow up visit in 4 weeks with the start of cycle #2. She also requested a Port-A-Cath placement before starting her systemic chemotherapy. I will E-scribe Emla Cream to be applied to the Port-A-Cath site before her chemotherapy. The patient was advised to call immediately if she has any concerning symptoms in the interval. The patient voices understanding of current disease status and treatment options and is in agreement with the current care plan.  All questions were answered. The patient knows to call the clinic with any problems, questions or concerns. We can certainly see the patient much sooner if  necessary. I spent 15 minutes of face-to-face counseling with the patient of the total visit time 25 minutes.  Disclaimer: This note was dictated with voice recognition software. Similar sounding words can inadvertently be transcribed and may not be corrected upon review.

## 2013-11-01 NOTE — Telephone Encounter (Signed)
gv adn printed appt sched and avs for pt for Jan and Feb....sed added tx. °

## 2013-11-02 ENCOUNTER — Other Ambulatory Visit: Payer: Self-pay | Admitting: Radiology

## 2013-11-03 NOTE — Patient Instructions (Signed)
We discussed your treatment options. We will start treatment with single agent gemcitabine next week. Follow up visit in 4 weeks.

## 2013-11-06 ENCOUNTER — Encounter (HOSPITAL_COMMUNITY): Payer: Self-pay | Admitting: Pharmacy Technician

## 2013-11-07 ENCOUNTER — Ambulatory Visit (HOSPITAL_BASED_OUTPATIENT_CLINIC_OR_DEPARTMENT_OTHER): Payer: Medicare Other

## 2013-11-07 ENCOUNTER — Other Ambulatory Visit: Payer: Medicare Other

## 2013-11-07 ENCOUNTER — Other Ambulatory Visit: Payer: Self-pay | Admitting: Internal Medicine

## 2013-11-07 ENCOUNTER — Encounter (HOSPITAL_COMMUNITY): Payer: Self-pay

## 2013-11-07 ENCOUNTER — Ambulatory Visit (HOSPITAL_COMMUNITY)
Admission: RE | Admit: 2013-11-07 | Discharge: 2013-11-07 | Disposition: A | Discharge status: Home / Self Care | Payer: Medicare Other | Source: Ambulatory Visit | Attending: Internal Medicine | Admitting: Internal Medicine

## 2013-11-07 ENCOUNTER — Other Ambulatory Visit: Payer: Self-pay | Admitting: Medical Oncology

## 2013-11-07 VITALS — BP 139/63 | HR 103 | Temp 98.1°F | Resp 18

## 2013-11-07 DIAGNOSIS — Z87891 Personal history of nicotine dependence: Secondary | ICD-10-CM | POA: Insufficient documentation

## 2013-11-07 DIAGNOSIS — Z923 Personal history of irradiation: Secondary | ICD-10-CM | POA: Insufficient documentation

## 2013-11-07 DIAGNOSIS — E119 Type 2 diabetes mellitus without complications: Secondary | ICD-10-CM | POA: Insufficient documentation

## 2013-11-07 DIAGNOSIS — J4489 Other specified chronic obstructive pulmonary disease: Secondary | ICD-10-CM | POA: Insufficient documentation

## 2013-11-07 DIAGNOSIS — Z79899 Other long term (current) drug therapy: Secondary | ICD-10-CM | POA: Insufficient documentation

## 2013-11-07 DIAGNOSIS — E039 Hypothyroidism, unspecified: Secondary | ICD-10-CM | POA: Insufficient documentation

## 2013-11-07 DIAGNOSIS — C3490 Malignant neoplasm of unspecified part of unspecified bronchus or lung: Secondary | ICD-10-CM

## 2013-11-07 DIAGNOSIS — F411 Generalized anxiety disorder: Secondary | ICD-10-CM | POA: Insufficient documentation

## 2013-11-07 DIAGNOSIS — C778 Secondary and unspecified malignant neoplasm of lymph nodes of multiple regions: Secondary | ICD-10-CM

## 2013-11-07 DIAGNOSIS — J449 Chronic obstructive pulmonary disease, unspecified: Secondary | ICD-10-CM | POA: Insufficient documentation

## 2013-11-07 DIAGNOSIS — E785 Hyperlipidemia, unspecified: Secondary | ICD-10-CM | POA: Insufficient documentation

## 2013-11-07 DIAGNOSIS — C3410 Malignant neoplasm of upper lobe, unspecified bronchus or lung: Secondary | ICD-10-CM

## 2013-11-07 DIAGNOSIS — Z5111 Encounter for antineoplastic chemotherapy: Secondary | ICD-10-CM

## 2013-11-07 DIAGNOSIS — Z90710 Acquired absence of both cervix and uterus: Secondary | ICD-10-CM | POA: Insufficient documentation

## 2013-11-07 LAB — CBC
HCT: 39.3 % (ref 36.0–46.0)
HEMOGLOBIN: 13.2 g/dL (ref 12.0–15.0)
MCH: 28.2 pg (ref 26.0–34.0)
MCHC: 33.6 g/dL (ref 30.0–36.0)
MCV: 84 fL (ref 78.0–100.0)
Platelets: 204 10*3/uL (ref 150–400)
RBC: 4.68 MIL/uL (ref 3.87–5.11)
RDW: 13.9 % (ref 11.5–15.5)
WBC: 8.3 10*3/uL (ref 4.0–10.5)

## 2013-11-07 LAB — DIFFERENTIAL
BASOS PCT: 1 % (ref 0–1)
Basophils Absolute: 0.1 10*3/uL (ref 0.0–0.1)
Eosinophils Absolute: 0.2 10*3/uL (ref 0.0–0.7)
Eosinophils Relative: 3 % (ref 0–5)
Lymphocytes Relative: 17 % (ref 12–46)
Lymphs Abs: 1.3 10*3/uL (ref 0.7–4.0)
MONOS PCT: 6 % (ref 3–12)
Monocytes Absolute: 0.5 10*3/uL (ref 0.1–1.0)
NEUTROS ABS: 5.3 10*3/uL (ref 1.7–7.7)
Neutrophils Relative %: 72 % (ref 43–77)

## 2013-11-07 LAB — COMPREHENSIVE METABOLIC PANEL
ALBUMIN: 3.9 g/dL (ref 3.5–5.2)
ALK PHOS: 77 U/L (ref 39–117)
ALT: 23 U/L (ref 0–35)
AST: 19 U/L (ref 0–37)
BUN: 14 mg/dL (ref 6–23)
CHLORIDE: 99 meq/L (ref 96–112)
CO2: 22 mEq/L (ref 19–32)
CREATININE: 0.63 mg/dL (ref 0.50–1.10)
Calcium: 8.8 mg/dL (ref 8.4–10.5)
GFR calc Af Amer: >90 mL/min (ref >90)
GFR calc non Af Amer: >90 mL/min (ref >90)
GLUCOSE: 205 mg/dL — AB (ref 70–99)
Potassium: 4 mEq/L (ref 3.7–5.3)
Sodium: 139 mEq/L (ref 137–147)
Total Protein: 7.6 g/dL (ref 6.0–8.3)

## 2013-11-07 LAB — PROTIME-INR
INR: 0.89 (ref 0.00–1.49)
Prothrombin Time: 11.9 seconds (ref 11.6–15.2)

## 2013-11-07 LAB — APTT: APTT: 28 s (ref 24–37)

## 2013-11-07 LAB — GLUCOSE, CAPILLARY: Glucose-Capillary: 192 mg/dL — ABNORMAL HIGH (ref 70–99)

## 2013-11-07 MED ORDER — VANCOMYCIN HCL IN DEXTROSE 1-5 GM/200ML-% IV SOLN
1000.0000 mg | INTRAVENOUS | Status: AC
  Administered 2013-11-07: 1000 mg via INTRAVENOUS
  Filled 2013-11-07: qty 200

## 2013-11-07 MED ORDER — MIDAZOLAM HCL 2 MG/2ML IJ SOLN
INTRAMUSCULAR | Status: AC | PRN
  Administered 2013-11-07 (×2): 0.5 mg via INTRAVENOUS
  Administered 2013-11-07: 1 mg via INTRAVENOUS
  Administered 2013-11-07: 0.5 mg via INTRAVENOUS
  Administered 2013-11-07: 1 mg via INTRAVENOUS

## 2013-11-07 MED ORDER — MIDAZOLAM HCL 2 MG/2ML IJ SOLN
INTRAMUSCULAR | Status: AC
  Filled 2013-11-07: qty 6

## 2013-11-07 MED ORDER — LIDOCAINE-EPINEPHRINE (PF) 2 %-1:200000 IJ SOLN
INTRAMUSCULAR | Status: AC
  Filled 2013-11-07: qty 20

## 2013-11-07 MED ORDER — PROCHLORPERAZINE MALEATE 10 MG PO TABS
ORAL_TABLET | ORAL | Status: AC
  Filled 2013-11-07: qty 1

## 2013-11-07 MED ORDER — SODIUM CHLORIDE 0.9 % IV SOLN
1000.0000 mg/m2 | Freq: Once | INTRAVENOUS | Status: AC
  Administered 2013-11-07: 2014 mg via INTRAVENOUS
  Filled 2013-11-07: qty 53

## 2013-11-07 MED ORDER — SODIUM CHLORIDE 0.9 % IV SOLN
Freq: Once | INTRAVENOUS | Status: AC
  Administered 2013-11-07: 20 mL via INTRAVENOUS

## 2013-11-07 MED ORDER — SODIUM CHLORIDE 0.9 % IJ SOLN
10.0000 mL | INTRAMUSCULAR | Status: DC | PRN
  Administered 2013-11-07: 10 mL
  Filled 2013-11-07: qty 10

## 2013-11-07 MED ORDER — FENTANYL CITRATE 0.05 MG/ML IJ SOLN
INTRAMUSCULAR | Status: AC | PRN
  Administered 2013-11-07 (×2): 50 ug via INTRAVENOUS

## 2013-11-07 MED ORDER — FENTANYL CITRATE 0.05 MG/ML IJ SOLN
INTRAMUSCULAR | Status: AC
  Filled 2013-11-07: qty 6

## 2013-11-07 MED ORDER — SODIUM CHLORIDE 0.9 % IV SOLN
INTRAVENOUS | Status: DC
  Administered 2013-11-07: 08:00:00 via INTRAVENOUS

## 2013-11-07 MED ORDER — HEPARIN SOD (PORK) LOCK FLUSH 100 UNIT/ML IV SOLN
INTRAVENOUS | Status: AC
  Filled 2013-11-07: qty 5

## 2013-11-07 MED ORDER — HEPARIN SOD (PORK) LOCK FLUSH 100 UNIT/ML IV SOLN
500.0000 [IU] | Freq: Once | INTRAVENOUS | Status: AC | PRN
  Administered 2013-11-07: 500 [IU]
  Filled 2013-11-07: qty 5

## 2013-11-07 MED ORDER — PROCHLORPERAZINE MALEATE 10 MG PO TABS
10.0000 mg | ORAL_TABLET | Freq: Once | ORAL | Status: AC
  Administered 2013-11-07: 10 mg via ORAL

## 2013-11-07 NOTE — Procedures (Signed)
Successful placement of right IJ approach port-a-cath with tip at the superior caval atrial junction. The catheter is ready for immediate use. No immediate post procedural complications.

## 2013-11-07 NOTE — H&P (Signed)
Janice Ball is an 58 y.o. female.   Chief Complaint: "I'm here for a port a cath" HPI: Patient with history of recurrent [place] lung carcinoma presents today for port a cath placement for chemotherapy.  Past Medical History  Diagnosis Date  . Anxiety   . Hyperlipidemia   . Hypothyroidism   . COPD (chronic obstructive pulmonary disease)   . Arthritis     thumbs  . 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 Lung cancer     lung ca dx 8/10  . Lung cancer   . Diabetes mellitus     Past Surgical History  Procedure Laterality Date  . Total abdominal hysterectomy    . Thyroidectomy, partial    . Tonsillectomy    . Tubal ligation      Family History  Problem Relation Age of Onset  . Colon cancer Mother   . Cancer Mother     colon   Social History:  reports that she quit smoking about 8 months ago. Her smoking use included Cigarettes. She has a 45 pack-year smoking history. She has never used smokeless tobacco. She reports that she does not drink alcohol or use illicit drugs.  Allergies:  Allergies  Allergen Reactions  . Sulfonamide Derivatives Rash  . Codeine Other (See Comments)    hallucinations  . Penicillins     As a child    Current outpatient prescriptions:albuterol (PROVENTIL HFA;VENTOLIN HFA) 108 (90 BASE) MCG/ACT inhaler, Inhale 2 puffs into the lungs every 6 (six) hours as needed for wheezing or shortness of breath., Disp: , Rfl: ;  aspirin 325 MG tablet, Take 325 mg by mouth daily.  , Disp: , Rfl: ;  budesonide-formoterol (SYMBICORT) 160-4.5 MCG/ACT inhaler, Inhale 2 puffs into the lungs 2 (two) times daily., Disp: , Rfl:  cholecalciferol (VITAMIN D) 1000 UNITS tablet, Take 1,000 Units by mouth daily., Disp: , Rfl: ;  Chromium 1000 MCG TABS, Take 1 tablet by mouth daily.  , Disp: , Rfl: ;  Cyanocobalamin (VITAMIN B-12 PO), Take 1,500 mcg by mouth daily., Disp: , Rfl: ;  glimepiride (AMARYL) 2 MG tablet, Take 2 mg by mouth daily before breakfast. , Disp: ,  Rfl:  Guaifenesin (MUCINEX MAXIMUM STRENGTH) 1200 MG TB12, Take 1 tablet by mouth every 12 (twelve) hours as needed. For cough, Disp: , Rfl: ;  ibuprofen (ADVIL,MOTRIN) 200 MG tablet, Take 400 mg by mouth 2 (two) times daily as needed (pain)., Disp: , Rfl: ;  KRILL OIL PO, Take 1 tablet by mouth daily., Disp: , Rfl: ;  levothyroxine (SYNTHROID, LEVOTHROID) 125 MCG tablet, Take 125 mcg by mouth daily before breakfast. , Disp: , Rfl:  LORazepam (ATIVAN) 1 MG tablet, Take 1 mg by mouth 4 (four) times daily as needed for anxiety., Disp: , Rfl: ;  magnesium gluconate (MAGONATE) 500 MG tablet, Take 500 mg by mouth daily. , Disp: , Rfl: ;  metFORMIN (GLUCOPHAGE) 1000 MG tablet, Take 1,000 mg by mouth 2 (two) times daily with a meal., Disp: , Rfl: ;  Multiple Vitamins-Minerals (CENTRUM SILVER PO), Take 1 tablet by mouth daily.  , Disp: , Rfl:  sertraline (ZOLOFT) 100 MG tablet, Take 150 mg by mouth at bedtime. Takes 1 1/2 tabs daily for total dose of 150 mg, Disp: , Rfl: ;  spironolactone (ALDACTONE) 25 MG tablet, Take 25 mg by mouth every morning. , Disp: , Rfl: ;  lidocaine-prilocaine (EMLA) cream, Apply 1 application topically as needed., Disp: 30 g, Rfl:  0 Current facility-administered medications:fentaNYL (SUBLIMAZE) 0.05 MG/ML injection, , , , ;  heparin lock flush 100 UNIT/ML injection, , , , ;  lidocaine-EPINEPHrine (XYLOCAINE W/EPI) 2 %-1:200000 (PF) injection, , , , ;  midazolam (VERSED) 2 MG/2ML injection, , , ,  Facility-Administered Medications Ordered in Other Encounters: 0.9 %  sodium chloride infusion, , Intravenous, Continuous, D Kevin Allred, PA-C, Last Rate: 20 mL/hr at 11/07/13 0756;  vancomycin (VANCOCIN) IVPB 1000 mg/200 mL premix, 1,000 mg, Intravenous, On Call, D Rowe Robert, PA-C  Results for orders placed during the hospital encounter of 10/24/13  GLUCOSE, CAPILLARY      Result Value Range   Glucose-Capillary 171 (*) 70 - 99 mg/dL    No results found.  Review of Systems   Constitutional: Negative for fever and chills.  Respiratory: Positive for cough and shortness of breath. Negative for hemoptysis.   Cardiovascular: Negative for chest pain.  Gastrointestinal: Negative for nausea, vomiting and abdominal pain.  Musculoskeletal: Negative for back pain.  Neurological: Negative for headaches.  Endo/Heme/Allergies: Does not bruise/bleed easily.  Psychiatric/Behavioral: The patient is nervous/anxious.     Blood pressure 146/72, pulse 111, temperature 97.4 F (36.3 C), resp. rate 20, SpO2 94.00%. Physical Exam  Constitutional: She is oriented to person, place, and time. She appears well-developed and well-nourished.  Cardiovascular: Regular rhythm.   Tachy but regular  Respiratory: Effort normal.  Few insp wheezes  GI: Soft. Bowel sounds are normal. There is no tenderness.  Musculoskeletal: Normal range of motion. She exhibits no edema.  Neurological: She is alert and oriented to person, place, and time.     Assessment/Plan Patient with history of recurrent [place] lung carcinoma presents today for port a cath placement for chemotherapy. Details/risks of procedure d/w pt with her understanding and consent.   ALLRED,D KEVIN 11/07/2013, 8:48 AM

## 2013-11-07 NOTE — Discharge Instructions (Signed)
Moderate Sedation, Adult Moderate sedation is given to help you relax or even sleep through a procedure. You may remain sleepy, be clumsy, or have poor balance for several hours following this procedure. Arrange for a responsible adult, family member, or friend to take you home. A responsible adult should stay with you for at least 24 hours or until the medicines have worn off.  Do not participate in any activities where you could become injured for the next 24 hours, or until you feel normal again. Do not:  Drive.  Swim.  Ride a bicycle.  Operate heavy machinery.  Cook.  Use power tools.  Climb ladders.  Work at [employer].  Do not make important decisions or sign legal documents until you are improved.  Vomiting may occur if you eat too soon. When you can drink without vomiting, try water, juice, or soup. Try solid foods if you feel little or no nausea.  Only take over-the-counter or prescription medications for pain, discomfort, or fever as directed by your caregiver.If pain medications have been prescribed for you, ask your caregiver how soon it is safe to take them.  Make sure you and your family fully understands everything about the medication given to you. Make sure you understand what side effects may occur.  You should not drink alcohol, take sleeping pills, or medications that cause drowsiness for at least 24 hours.  If you smoke, do not smoke alone.  If you are feeling better, you may resume normal activities 24 hours after receiving sedation.  Keep all appointments as scheduled. Follow all instructions.  Ask questions if you do not understand. SEEK MEDICAL CARE IF:   Your skin is pale or bluish in color.  You continue to feel sick to your stomach (nauseous) or throw up (vomit).  Your pain is getting worse and not helped by medication.  You have bleeding or swelling.  You are still sleepy or feeling clumsy after 24 hours. SEEK IMMEDIATE MEDICAL CARE IF:    You develop a rash.  You have difficulty breathing.  You develop any type of allergic problem.  You have a fever. Document Released: 06/29/2001 Document Revised: 12/27/2011 Document Reviewed: 06/11/2013 [facility] Patient Information 2014 [place].  Implanted Port Insertion, Care After Refer to this sheet in the next few weeks. These instructions provide you with information on caring for yourself after your procedure. Your health care provider may also give you more specific instructions. Your treatment has been planned according to current medical practices, but problems sometimes occur. Call your health care provider if you have any problems or questions after your procedure. WHAT TO EXPECT AFTER THE PROCEDURE After your procedure, it is typical to have the following:   Discomfort at the port insertion site. Ice packs to the area will help.  Bruising on the skin over the port. This will subside in 3 4 days. HOME CARE INSTRUCTIONS  After your port is placed, you will get a manufacturer's information card. The card has information about your port. Keep this card with you at all times.   Know what kind of port you have. There are many types of ports available.   Wear a medical alert bracelet in case of an emergency. This can help alert health care workers that you have a port.   The port can stay in for as long as your health care provider believes it is necessary.   A home health care nurse may give medicines and take care of the port.  You or a family member can get special training and directions for giving medicine and taking care of the port at home.  SEEK MEDICAL CARE IF:  Your port does not flush or you are unable to get a blood return.   SEEK IMMEDIATE MEDICAL CARE IF:  You have new fluid or pus coming from your incision.   You notice a bad smell coming from your incision site.   You have swelling, pain, or more redness at the incision or port site.    You have a fever or chills.   You have chest pain or shortness of breath. Document Released: 07/25/2013 Document Reviewed: 06/11/2013 [facility] Patient Information 2014 [place], [place].

## 2013-11-08 ENCOUNTER — Telehealth: Payer: Self-pay | Admitting: Medical Oncology

## 2013-11-08 DIAGNOSIS — T451X5A Adverse effect of antineoplastic and immunosuppressive drugs, initial encounter: Principal | ICD-10-CM

## 2013-11-08 DIAGNOSIS — R112 Nausea with vomiting, unspecified: Secondary | ICD-10-CM

## 2013-11-08 MED ORDER — PROCHLORPERAZINE MALEATE 10 MG PO TABS: 10.0000 mg | ORAL_TABLET | Freq: Four times a day (QID) | ORAL | Status: DC | PRN

## 2013-11-08 NOTE — Telephone Encounter (Signed)
Called in compazine

## 2013-11-14 ENCOUNTER — Telehealth: Payer: Self-pay | Admitting: Internal Medicine

## 2013-11-14 ENCOUNTER — Other Ambulatory Visit: Payer: Medicare Other

## 2013-11-14 ENCOUNTER — Other Ambulatory Visit: Payer: Self-pay | Admitting: Internal Medicine

## 2013-11-14 ENCOUNTER — Ambulatory Visit: Payer: Medicare Other

## 2013-11-14 NOTE — Telephone Encounter (Signed)
pt came in today for lb/tx and had a child with her. appts r/s from 1/28 to 1/29. pt has new d/t. desk nurse informed.

## 2013-11-15 ENCOUNTER — Other Ambulatory Visit (HOSPITAL_BASED_OUTPATIENT_CLINIC_OR_DEPARTMENT_OTHER): Payer: Medicare Other

## 2013-11-15 ENCOUNTER — Ambulatory Visit (HOSPITAL_BASED_OUTPATIENT_CLINIC_OR_DEPARTMENT_OTHER): Payer: Medicare Other

## 2013-11-15 VITALS — BP 136/69 | HR 111 | Temp 98.1°F | Resp 18

## 2013-11-15 DIAGNOSIS — C778 Secondary and unspecified malignant neoplasm of lymph nodes of multiple regions: Secondary | ICD-10-CM

## 2013-11-15 DIAGNOSIS — C3490 Malignant neoplasm of unspecified part of unspecified bronchus or lung: Secondary | ICD-10-CM

## 2013-11-15 DIAGNOSIS — C3410 Malignant neoplasm of upper lobe, unspecified bronchus or lung: Secondary | ICD-10-CM

## 2013-11-15 DIAGNOSIS — Z5111 Encounter for antineoplastic chemotherapy: Secondary | ICD-10-CM

## 2013-11-15 LAB — COMPREHENSIVE METABOLIC PANEL (CC13)
ALBUMIN: 3.8 g/dL (ref 3.5–5.0)
ALT: 65 U/L — AB (ref 0–55)
ANION GAP: 13 meq/L — AB (ref 3–11)
AST: 31 U/L (ref 5–34)
Alkaline Phosphatase: 78 U/L (ref 40–150)
BUN: 6.8 mg/dL — ABNORMAL LOW (ref 7.0–26.0)
CALCIUM: 9.1 mg/dL (ref 8.4–10.4)
CHLORIDE: 102 meq/L (ref 98–109)
CO2: 25 mEq/L (ref 22–29)
CREATININE: 0.7 mg/dL (ref 0.6–1.1)
Glucose: 178 mg/dl — ABNORMAL HIGH (ref 70–140)
POTASSIUM: 3.9 meq/L (ref 3.5–5.1)
Sodium: 139 mEq/L (ref 136–145)
Total Bilirubin: <0.2 mg/dL (ref 0.20–1.20)
Total Protein: 7.4 g/dL (ref 6.4–8.3)

## 2013-11-15 LAB — CBC WITH DIFFERENTIAL/PLATELET
BASO%: 0.4 % (ref 0.0–2.0)
BASOS ABS: 0 10*3/uL (ref 0.0–0.1)
EOS ABS: 0.1 10*3/uL (ref 0.0–0.5)
EOS%: 1 % (ref 0.0–7.0)
HCT: 36.8 % (ref 34.8–46.6)
HGB: 12.4 g/dL (ref 11.6–15.9)
LYMPH#: 1.1 10*3/uL (ref 0.9–3.3)
LYMPH%: 21.2 % (ref 14.0–49.7)
MCH: 27.9 pg (ref 25.1–34.0)
MCHC: 33.7 g/dL (ref 31.5–36.0)
MCV: 82.7 fL (ref 79.5–101.0)
MONO#: 0.4 10*3/uL (ref 0.1–0.9)
MONO%: 7 % (ref 0.0–14.0)
NEUT%: 70.4 % (ref 38.4–76.8)
NEUTROS ABS: 3.6 10*3/uL (ref 1.5–6.5)
NRBC: 0 % (ref 0–0)
Platelets: 123 10*3/uL — ABNORMAL LOW (ref 145–400)
RBC: 4.45 10*6/uL (ref 3.70–5.45)
RDW: 13.9 % (ref 11.2–14.5)
WBC: 5.1 10*3/uL (ref 3.9–10.3)

## 2013-11-15 MED ORDER — SODIUM CHLORIDE 0.9 % IV SOLN
Freq: Once | INTRAVENOUS | Status: AC
  Administered 2013-11-15: 13:00:00 via INTRAVENOUS

## 2013-11-15 MED ORDER — PROCHLORPERAZINE MALEATE 10 MG PO TABS
ORAL_TABLET | ORAL | Status: AC
  Filled 2013-11-15: qty 1

## 2013-11-15 MED ORDER — PROCHLORPERAZINE MALEATE 10 MG PO TABS
10.0000 mg | ORAL_TABLET | Freq: Once | ORAL | Status: AC
  Administered 2013-11-15: 10 mg via ORAL

## 2013-11-15 MED ORDER — SODIUM CHLORIDE 0.9 % IJ SOLN
10.0000 mL | INTRAMUSCULAR | Status: DC | PRN
  Administered 2013-11-15: 10 mL
  Filled 2013-11-15: qty 10

## 2013-11-15 MED ORDER — HEPARIN SOD (PORK) LOCK FLUSH 100 UNIT/ML IV SOLN
500.0000 [IU] | Freq: Once | INTRAVENOUS | Status: AC | PRN
  Administered 2013-11-15: 500 [IU]
  Filled 2013-11-15: qty 5

## 2013-11-15 MED ORDER — SODIUM CHLORIDE 0.9 % IV SOLN
1000.0000 mg/m2 | Freq: Once | INTRAVENOUS | Status: AC
  Administered 2013-11-15: 2014 mg via INTRAVENOUS
  Filled 2013-11-15: qty 52.97

## 2013-11-15 NOTE — Progress Notes (Signed)
56 PAC flushed and deaccessed without difficulty. Site noted to be bleeding after deaccess. Gauze and pressure held to site. Dressing changed prior to discharge with new gauze and pressure dressing applied to site. No bleeding noted at time of discharge. Patient instructed to assess site this evening and apply more gauze and pressure if dressing is saturated with blood. If bleeding continues without stopping call clinic. Patient verbalizes understanding and ambulated to lobby in no acute distress and without complaints.

## 2013-11-15 NOTE — Patient Instructions (Signed)
[facility] Discharge Instructions for Patients Receiving Chemotherapy  Today you received the following chemotherapy agent: Gemzar   To help prevent nausea and vomiting after your treatment, we encourage you to take your nausea medication as prescribed.   If you develop nausea and vomiting that is not controlled by your nausea medication, call the clinic.   BELOW ARE SYMPTOMS THAT SHOULD BE REPORTED IMMEDIATELY:  *FEVER GREATER THAN 100.5 F  *CHILLS WITH OR WITHOUT FEVER  NAUSEA AND VOMITING THAT IS NOT CONTROLLED WITH YOUR NAUSEA MEDICATION  *UNUSUAL SHORTNESS OF BREATH  *UNUSUAL BRUISING OR BLEEDING  TENDERNESS IN MOUTH AND THROAT WITH OR WITHOUT PRESENCE OF ULCERS  *URINARY PROBLEMS  *BOWEL PROBLEMS  UNUSUAL RASH Items with * indicate a potential emergency and should be followed up as soon as possible.  Feel free to call the clinic you have any questions or concerns. The clinic phone number is [phone].

## 2013-11-21 ENCOUNTER — Other Ambulatory Visit (HOSPITAL_BASED_OUTPATIENT_CLINIC_OR_DEPARTMENT_OTHER): Payer: Medicare Other

## 2013-11-21 DIAGNOSIS — C3410 Malignant neoplasm of upper lobe, unspecified bronchus or lung: Secondary | ICD-10-CM

## 2013-11-21 DIAGNOSIS — C3490 Malignant neoplasm of unspecified part of unspecified bronchus or lung: Secondary | ICD-10-CM

## 2013-11-21 LAB — CBC WITH DIFFERENTIAL/PLATELET
BASO%: 1.5 % (ref 0.0–2.0)
Basophils Absolute: 0 10*3/uL (ref 0.0–0.1)
EOS ABS: 0 10*3/uL (ref 0.0–0.5)
EOS%: 1.1 % (ref 0.0–7.0)
HEMATOCRIT: 34.9 % (ref 34.8–46.6)
HEMOGLOBIN: 11.6 g/dL (ref 11.6–15.9)
LYMPH%: 27.3 % (ref 14.0–49.7)
MCH: 28 pg (ref 25.1–34.0)
MCHC: 33.2 g/dL (ref 31.5–36.0)
MCV: 84.4 fL (ref 79.5–101.0)
MONO#: 0.1 10*3/uL (ref 0.1–0.9)
MONO%: 2.8 % (ref 0.0–14.0)
NEUT%: 67.3 % (ref 38.4–76.8)
NEUTROS ABS: 2.2 10*3/uL (ref 1.5–6.5)
PLATELETS: 155 10*3/uL (ref 145–400)
RBC: 4.13 10*6/uL (ref 3.70–5.45)
RDW: 14.1 % (ref 11.2–14.5)
WBC: 3.3 10*3/uL — ABNORMAL LOW (ref 3.9–10.3)
lymph#: 0.9 10*3/uL (ref 0.9–3.3)

## 2013-11-21 LAB — COMPREHENSIVE METABOLIC PANEL (CC13)
ALK PHOS: 74 U/L (ref 40–150)
ALT: 132 U/L — AB (ref 0–55)
AST: 89 U/L — AB (ref 5–34)
Albumin: 3.7 g/dL (ref 3.5–5.0)
Anion Gap: 12 mEq/L — ABNORMAL HIGH (ref 3–11)
BUN: 7.4 mg/dL (ref 7.0–26.0)
CO2: 25 mEq/L (ref 22–29)
Calcium: 9.2 mg/dL (ref 8.4–10.4)
Chloride: 100 mEq/L (ref 98–109)
Creatinine: 0.7 mg/dL (ref 0.6–1.1)
Glucose: 258 mg/dl — ABNORMAL HIGH (ref 70–140)
Potassium: 4.2 mEq/L (ref 3.5–5.1)
Sodium: 137 mEq/L (ref 136–145)
Total Bilirubin: 0.2 mg/dL (ref 0.20–1.20)
Total Protein: 7.2 g/dL (ref 6.4–8.3)

## 2013-11-28 ENCOUNTER — Ambulatory Visit (HOSPITAL_BASED_OUTPATIENT_CLINIC_OR_DEPARTMENT_OTHER): Payer: Medicare Other

## 2013-11-28 ENCOUNTER — Other Ambulatory Visit (HOSPITAL_BASED_OUTPATIENT_CLINIC_OR_DEPARTMENT_OTHER): Payer: Medicare Other

## 2013-11-28 ENCOUNTER — Encounter: Payer: Self-pay | Admitting: Internal Medicine

## 2013-11-28 ENCOUNTER — Ambulatory Visit (HOSPITAL_BASED_OUTPATIENT_CLINIC_OR_DEPARTMENT_OTHER): Payer: Medicare Other | Admitting: Internal Medicine

## 2013-11-28 VITALS — BP 129/66 | HR 113 | Temp 97.2°F | Resp 19 | Ht 68.0 in | Wt 187.4 lb

## 2013-11-28 DIAGNOSIS — C3410 Malignant neoplasm of upper lobe, unspecified bronchus or lung: Secondary | ICD-10-CM

## 2013-11-28 DIAGNOSIS — C3490 Malignant neoplasm of unspecified part of unspecified bronchus or lung: Secondary | ICD-10-CM

## 2013-11-28 DIAGNOSIS — Z5111 Encounter for antineoplastic chemotherapy: Secondary | ICD-10-CM

## 2013-11-28 DIAGNOSIS — R110 Nausea: Secondary | ICD-10-CM

## 2013-11-28 DIAGNOSIS — R5383 Other fatigue: Secondary | ICD-10-CM

## 2013-11-28 DIAGNOSIS — R5381 Other malaise: Secondary | ICD-10-CM

## 2013-11-28 LAB — CBC WITH DIFFERENTIAL/PLATELET
BASO%: 0.6 % (ref 0.0–2.0)
Basophils Absolute: 0 10*3/uL (ref 0.0–0.1)
EOS%: 1.7 % (ref 0.0–7.0)
Eosinophils Absolute: 0.1 10*3/uL (ref 0.0–0.5)
HCT: 37.9 % (ref 34.8–46.6)
HGB: 12.5 g/dL (ref 11.6–15.9)
LYMPH%: 20.7 % (ref 14.0–49.7)
MCH: 28.2 pg (ref 25.1–34.0)
MCHC: 33 g/dL (ref 31.5–36.0)
MCV: 85.4 fL (ref 79.5–101.0)
MONO#: 0.5 10*3/uL (ref 0.1–0.9)
MONO%: 9.1 % (ref 0.0–14.0)
NEUT#: 3.7 10*3/uL (ref 1.5–6.5)
NEUT%: 67.9 % (ref 38.4–76.8)
PLATELETS: 234 10*3/uL (ref 145–400)
RBC: 4.44 10*6/uL (ref 3.70–5.45)
RDW: 16.5 % — ABNORMAL HIGH (ref 11.2–14.5)
WBC: 5.4 10*3/uL (ref 3.9–10.3)
lymph#: 1.1 10*3/uL (ref 0.9–3.3)

## 2013-11-28 LAB — COMPREHENSIVE METABOLIC PANEL (CC13)
ALT: 56 U/L — AB (ref 0–55)
ANION GAP: 12 meq/L — AB (ref 3–11)
AST: 40 U/L — AB (ref 5–34)
Albumin: 3.9 g/dL (ref 3.5–5.0)
Alkaline Phosphatase: 76 U/L (ref 40–150)
BILIRUBIN TOTAL: 0.21 mg/dL (ref 0.20–1.20)
BUN: 8.6 mg/dL (ref 7.0–26.0)
CO2: 23 meq/L (ref 22–29)
Calcium: 9 mg/dL (ref 8.4–10.4)
Chloride: 103 mEq/L (ref 98–109)
Creatinine: 0.7 mg/dL (ref 0.6–1.1)
Glucose: 131 mg/dl (ref 70–140)
Potassium: 4.2 mEq/L (ref 3.5–5.1)
SODIUM: 139 meq/L (ref 136–145)
TOTAL PROTEIN: 7.3 g/dL (ref 6.4–8.3)

## 2013-11-28 MED ORDER — HEPARIN SOD (PORK) LOCK FLUSH 100 UNIT/ML IV SOLN
500.0000 [IU] | Freq: Once | INTRAVENOUS | Status: AC | PRN
  Administered 2013-11-28: 500 [IU]
  Filled 2013-11-28: qty 5

## 2013-11-28 MED ORDER — SODIUM CHLORIDE 0.9 % IV SOLN
1000.0000 mg/m2 | Freq: Once | INTRAVENOUS | Status: AC
  Administered 2013-11-28: 2014 mg via INTRAVENOUS
  Filled 2013-11-28: qty 52.97

## 2013-11-28 MED ORDER — LIDOCAINE-PRILOCAINE 2.5-2.5 % EX CREA
TOPICAL_CREAM | CUTANEOUS | Status: AC
  Filled 2013-11-28: qty 5

## 2013-11-28 MED ORDER — PROCHLORPERAZINE MALEATE 10 MG PO TABS
ORAL_TABLET | ORAL | Status: AC
  Filled 2013-11-28: qty 1

## 2013-11-28 MED ORDER — SODIUM CHLORIDE 0.9 % IJ SOLN
10.0000 mL | INTRAMUSCULAR | Status: DC | PRN
  Administered 2013-11-28: 10 mL
  Filled 2013-11-28: qty 10

## 2013-11-28 MED ORDER — PROCHLORPERAZINE MALEATE 10 MG PO TABS
10.0000 mg | ORAL_TABLET | Freq: Once | ORAL | Status: AC
  Administered 2013-11-28: 10 mg via ORAL

## 2013-11-28 MED ORDER — SODIUM CHLORIDE 0.9 % IV SOLN
Freq: Once | INTRAVENOUS | Status: AC
  Administered 2013-11-28: 14:00:00 via INTRAVENOUS

## 2013-11-28 NOTE — Patient Instructions (Signed)
Followup visit in 3 weeks with the next cycle of her treatment.

## 2013-11-28 NOTE — Patient Instructions (Signed)
[facility] Discharge Instructions for Patients Receiving Chemotherapy  Today you received the following chemotherapy agents: Gemzar.  To help prevent nausea and vomiting after your treatment, we encourage you to take your nausea medication, Compazine. Take one every six hours as needed.   If you develop nausea and vomiting that is not controlled by your nausea medication, call the clinic.   BELOW ARE SYMPTOMS THAT SHOULD BE REPORTED IMMEDIATELY:  *FEVER GREATER THAN 100.5 F  *CHILLS WITH OR WITHOUT FEVER  NAUSEA AND VOMITING THAT IS NOT CONTROLLED WITH YOUR NAUSEA MEDICATION  *UNUSUAL SHORTNESS OF BREATH  *UNUSUAL BRUISING OR BLEEDING  TENDERNESS IN MOUTH AND THROAT WITH OR WITHOUT PRESENCE OF ULCERS  *URINARY PROBLEMS  *BOWEL PROBLEMS  UNUSUAL RASH Items with * indicate a potential emergency and should be followed up as soon as possible.  Feel free to call the clinic should you have any questions or concerns. The clinic phone number is [phone].

## 2013-11-28 NOTE — Progress Notes (Signed)
[facility] [phone]   [phone]  OFFICE PROGRESS NOTE  [name], NP [place] [address]  DIAGNOSIS: Recurrent non-small cell lung cancer, squamous cell carcinoma initially diagnosis stage IIIa (T1 N2 MX ) in August of 2010.   PRIOR THERAPY:  #1 status post concurrent chemoradiation with weekly carboplatin and paclitaxel, last dose of chemotherapy given 08/05/2011.  #2 status post consolidation chemotherapy with carboplatin paclitaxel last dose given 10/27/2009.  #3 Concurrent chemoradiation with weekly carboplatin for an AUC of 2 and paclitaxel at 45 mg per meter squared given concurrent with radiotherapy, last dose was given 09/20/2011.  #4 Systemic chemotherapy with gemcitabine 1000 mg meter squared on days 1 and 8 every 3 weeks status post 3 cycles with stable disease, last dose was given 12/20/2011.   CURRENT THERAPY: Systemic chemotherapy again with single agent gemcitabine 1000 mg/M2 on days 1 and 8 every 3 weeks. First cycle 11/07/2013.  INTERVAL HISTORY: Janice Ball 58 y.o. female returns to the clinic today for routine three-month follow up visit. She tolerated the first cycle of her systemic chemotherapy with gemcitabine fairly well with no significant complaints except for mild nausea and fatigue. The patient has no complaints today. She denied having any significant weight loss or night sweats. She has no chest pain, shortness of breath, cough or hemoptysis.  She denied any significant nausea or vomiting, no fever or chills.    MEDICAL HISTORY: Past Medical History  Diagnosis Date  . Anxiety   . Hyperlipidemia   . Hypothyroidism   . COPD (chronic obstructive pulmonary disease)   . Arthritis     thumbs  . 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 Lung cancer     lung ca dx 8/10  . Lung cancer   . Diabetes mellitus     ALLERGIES:  is allergic to sulfonamide derivatives; codeine;  and penicillins.  MEDICATIONS:  Current Outpatient Prescriptions  Medication Sig Dispense Refill  . acetaminophen (TYLENOL) 325 MG tablet Take 650 mg by mouth every 6 (six) hours as needed.      Marland Kitchen albuterol (PROVENTIL HFA;VENTOLIN HFA) 108 (90 BASE) MCG/ACT inhaler Inhale 2 puffs into the lungs every 6 (six) hours as needed for wheezing or shortness of breath.      Marland Kitchen aspirin 325 MG tablet Take 325 mg by mouth daily.        . budesonide-formoterol (SYMBICORT) 160-4.5 MCG/ACT inhaler Inhale 2 puffs into the lungs 2 (two) times daily.      . cholecalciferol (VITAMIN D) 1000 UNITS tablet Take 1,000 Units by mouth daily.      . Chromium 1000 MCG TABS Take 1 tablet by mouth daily.        . Cyanocobalamin (VITAMIN B-12 PO) Take 1,500 mcg by mouth daily.      Marland Kitchen glimepiride (AMARYL) 2 MG tablet Take 2 mg by mouth daily before breakfast.       . Guaifenesin (MUCINEX MAXIMUM STRENGTH) 1200 MG TB12 Take 1 tablet by mouth every 12 (twelve) hours as needed. For cough      . KRILL OIL PO Take 1 tablet by mouth daily.      Marland Kitchen levothyroxine (SYNTHROID, LEVOTHROID) 125 MCG tablet Take 125 mcg by mouth daily before breakfast.       . lidocaine-prilocaine (EMLA) cream Apply 1 application topically as needed.  30 g  0  . LORazepam (ATIVAN) 1 MG tablet Take 1 mg by  mouth 4 (four) times daily as needed for anxiety.      . magnesium gluconate (MAGONATE) 500 MG tablet Take 500 mg by mouth daily.       . metFORMIN (GLUCOPHAGE) 1000 MG tablet Take 1,000 mg by mouth 2 (two) times daily with a meal.      . Multiple Vitamins-Minerals (CENTRUM SILVER PO) Take 1 tablet by mouth daily.        . prochlorperazine (COMPAZINE) 10 MG tablet Take 1 tablet (10 mg total) by mouth every 6 (six) hours as needed for nausea or vomiting.  30 tablet  0  . sertraline (ZOLOFT) 100 MG tablet Take 150 mg by mouth at bedtime. Takes 1 1/2 tabs daily for total dose of 150 mg      . spironolactone (ALDACTONE) 25 MG tablet Take 25 mg by mouth every  morning.        No current facility-administered medications for this visit.    SURGICAL HISTORY:  Past Surgical History  Procedure Laterality Date  . Total abdominal hysterectomy    . Thyroidectomy, partial    . Tonsillectomy    . Tubal ligation      REVIEW OF SYSTEMS:  Constitutional: negative Eyes: negative Ears, nose, mouth, throat, and face: negative Respiratory: negative Cardiovascular: negative Gastrointestinal: negative Genitourinary:negative Integument/breast: negative Hematologic/lymphatic: negative Musculoskeletal:negative Neurological: negative Behavioral/Psych: negative Endocrine: negative Allergic/Immunologic: negative   PHYSICAL EXAMINATION: General appearance: alert, cooperative and no distress Head: Normocephalic, without obvious abnormality, atraumatic Neck: no adenopathy, no JVD, supple, symmetrical, trachea midline and thyroid not enlarged, symmetric, no tenderness/mass/nodules Lymph nodes: Cervical, supraclavicular, and axillary nodes normal. Resp: clear to auscultation bilaterally Back: symmetric, no curvature. ROM normal. No CVA tenderness. Cardio: regular rate and rhythm, S1, S2 normal, no murmur, click, rub or gallop GI: soft, non-tender; bowel sounds normal; no masses,  no organomegaly Extremities: extremities normal, atraumatic, no cyanosis or edema Neurologic: Alert and oriented X 3, normal strength and tone. Normal symmetric reflexes. Normal coordination and gait  ECOG PERFORMANCE STATUS: 0 - Asymptomatic  Blood pressure 129/66, pulse 113, temperature 97.2 F (36.2 C), temperature source Oral, resp. rate 19, height 5\' 8"  (1.727 m), weight 187 lb 6.4 oz (85.004 kg), SpO2 100.00%.  LABORATORY DATA: Lab Results  Component Value Date   WBC 5.4 11/28/2013   HGB 12.5 11/28/2013   HCT 37.9 11/28/2013   MCV 85.4 11/28/2013   PLT 234 11/28/2013      Chemistry      Component Value Date/Time   NA 137 11/21/2013 1220   NA 139 11/07/2013 0750   NA  141 04/04/2012 0945   K 4.2 11/21/2013 1220   K 4.0 11/07/2013 0750   K 4.7 04/04/2012 0945   CL 99 11/07/2013 0750   CL 101 04/06/2013 1041   CL 100 04/04/2012 0945   CO2 25 11/21/2013 1220   CO2 22 11/07/2013 0750   CO2 29 04/04/2012 0945   BUN 7.4 11/21/2013 1220   BUN 14 11/07/2013 0750   BUN 16 04/04/2012 0945   CREATININE 0.7 11/21/2013 1220   CREATININE 0.63 11/07/2013 0750   CREATININE 0.9 04/04/2012 0945      Component Value Date/Time   CALCIUM 9.2 11/21/2013 1220   CALCIUM 8.8 11/07/2013 0750   CALCIUM 8.4 04/04/2012 0945   ALKPHOS 74 11/21/2013 1220   ALKPHOS 77 11/07/2013 0750   ALKPHOS 61 04/04/2012 0945   AST 89* 11/21/2013 1220   AST 19 11/07/2013 0750   AST 31 04/04/2012 0945  ALT 132* 11/21/2013 1220   ALT 23 11/07/2013 0750   ALT 30 04/04/2012 0945   BILITOT 0.20 11/21/2013 1220   BILITOT <0.2* 11/07/2013 0750   BILITOT 0.50 04/04/2012 0945       RADIOGRAPHIC STUDIES:  ASSESSMENT AND PLAN: This is a very pleasant 58 years old white female with recurrent non-small cell lung cancer status post concurrent chemoradiation as well as consolidation chemotherapy and she is currently on observation.  The patient is tolerating her treatment with gemcitabine fairly well. I recommended for her to proceed with cycle #2 today as scheduled. She would come back for followup visit in 3 weeks with the next cycle of her treatment. The patient voices understanding of current disease status and treatment options and is in agreement with the current care plan.  All questions were answered. The patient knows to call the clinic with any problems, questions or concerns. We can certainly see the patient much sooner if necessary.   Disclaimer: This note was dictated with voice recognition software. Similar sounding words can inadvertently be transcribed and may not be corrected upon review.

## 2013-11-29 ENCOUNTER — Telehealth: Payer: Self-pay | Admitting: *Deleted

## 2013-11-29 NOTE — Telephone Encounter (Signed)
Per staff message and POF I have scheduled appts.  JMW  

## 2013-11-29 NOTE — Telephone Encounter (Signed)
Lm gv appt for 12/19/13 w/ labs@ 10:30am, ov@ 11am, and tx to follow. i emailed MW to add the tx. Made the pt aware that i will mail a letter/avs...td

## 2013-12-03 ENCOUNTER — Other Ambulatory Visit: Payer: Self-pay | Admitting: Medical Oncology

## 2013-12-03 ENCOUNTER — Telehealth: Payer: Self-pay | Admitting: Medical Oncology

## 2013-12-03 DIAGNOSIS — B370 Candidal stomatitis: Secondary | ICD-10-CM

## 2013-12-03 MED ORDER — FLUCONAZOLE 100 MG PO TABS: 100.0000 mg | ORAL_TABLET | Freq: Every day | ORAL | Status: DC

## 2013-12-03 NOTE — Telephone Encounter (Signed)
Called pt.

## 2013-12-03 NOTE — Telephone Encounter (Signed)
Called in diflucan and pt notified.

## 2013-12-05 ENCOUNTER — Ambulatory Visit (HOSPITAL_BASED_OUTPATIENT_CLINIC_OR_DEPARTMENT_OTHER): Payer: Medicare Other

## 2013-12-05 ENCOUNTER — Other Ambulatory Visit (HOSPITAL_BASED_OUTPATIENT_CLINIC_OR_DEPARTMENT_OTHER): Payer: Medicare Other

## 2013-12-05 VITALS — BP 127/85 | HR 103 | Temp 97.7°F | Resp 16

## 2013-12-05 DIAGNOSIS — C3490 Malignant neoplasm of unspecified part of unspecified bronchus or lung: Secondary | ICD-10-CM

## 2013-12-05 DIAGNOSIS — Z5111 Encounter for antineoplastic chemotherapy: Secondary | ICD-10-CM

## 2013-12-05 DIAGNOSIS — C778 Secondary and unspecified malignant neoplasm of lymph nodes of multiple regions: Secondary | ICD-10-CM

## 2013-12-05 DIAGNOSIS — C3410 Malignant neoplasm of upper lobe, unspecified bronchus or lung: Secondary | ICD-10-CM

## 2013-12-05 LAB — COMPREHENSIVE METABOLIC PANEL (CC13)
ALBUMIN: 3.9 g/dL (ref 3.5–5.0)
ALT: 67 U/L — ABNORMAL HIGH (ref 0–55)
AST: 40 U/L — ABNORMAL HIGH (ref 5–34)
Alkaline Phosphatase: 72 U/L (ref 40–150)
Anion Gap: 10 mEq/L (ref 3–11)
BUN: 10.3 mg/dL (ref 7.0–26.0)
CALCIUM: 9.6 mg/dL (ref 8.4–10.4)
CHLORIDE: 102 meq/L (ref 98–109)
CO2: 26 meq/L (ref 22–29)
CREATININE: 0.7 mg/dL (ref 0.6–1.1)
Glucose: 133 mg/dl (ref 70–140)
Potassium: 4.4 mEq/L (ref 3.5–5.1)
Sodium: 138 mEq/L (ref 136–145)
Total Bilirubin: 0.21 mg/dL (ref 0.20–1.20)
Total Protein: 7.3 g/dL (ref 6.4–8.3)

## 2013-12-05 LAB — CBC WITH DIFFERENTIAL/PLATELET
BASO%: 1.9 % (ref 0.0–2.0)
Basophils Absolute: 0.1 10*3/uL (ref 0.0–0.1)
EOS%: 0.4 % (ref 0.0–7.0)
Eosinophils Absolute: 0 10*3/uL (ref 0.0–0.5)
HEMATOCRIT: 35.8 % (ref 34.8–46.6)
HGB: 12 g/dL (ref 11.6–15.9)
LYMPH#: 1.1 10*3/uL (ref 0.9–3.3)
LYMPH%: 40.6 % (ref 14.0–49.7)
MCH: 28.4 pg (ref 25.1–34.0)
MCHC: 33.5 g/dL (ref 31.5–36.0)
MCV: 84.8 fL (ref 79.5–101.0)
MONO#: 0.4 10*3/uL (ref 0.1–0.9)
MONO%: 14.3 % — ABNORMAL HIGH (ref 0.0–14.0)
NEUT%: 42.8 % (ref 38.4–76.8)
NEUTROS ABS: 1.1 10*3/uL — AB (ref 1.5–6.5)
Platelets: 256 10*3/uL (ref 145–400)
RBC: 4.22 10*6/uL (ref 3.70–5.45)
RDW: 15.8 % — ABNORMAL HIGH (ref 11.2–14.5)
WBC: 2.7 10*3/uL — AB (ref 3.9–10.3)
nRBC: 1 % — ABNORMAL HIGH (ref 0–0)

## 2013-12-05 LAB — TECHNOLOGIST REVIEW

## 2013-12-05 MED ORDER — HEPARIN SOD (PORK) LOCK FLUSH 100 UNIT/ML IV SOLN
500.0000 [IU] | Freq: Once | INTRAVENOUS | Status: AC | PRN
  Administered 2013-12-05: 500 [IU]
  Filled 2013-12-05: qty 5

## 2013-12-05 MED ORDER — PROCHLORPERAZINE MALEATE 10 MG PO TABS
10.0000 mg | ORAL_TABLET | Freq: Once | ORAL | Status: AC
  Administered 2013-12-05: 10 mg via ORAL

## 2013-12-05 MED ORDER — PROCHLORPERAZINE MALEATE 10 MG PO TABS
ORAL_TABLET | ORAL | Status: AC
  Filled 2013-12-05: qty 1

## 2013-12-05 MED ORDER — SODIUM CHLORIDE 0.9 % IV SOLN
1000.0000 mg/m2 | Freq: Once | INTRAVENOUS | Status: AC
  Administered 2013-12-05: 2014 mg via INTRAVENOUS
  Filled 2013-12-05: qty 52.97

## 2013-12-05 MED ORDER — SODIUM CHLORIDE 0.9 % IJ SOLN
10.0000 mL | INTRAMUSCULAR | Status: DC | PRN
  Administered 2013-12-05: 10 mL
  Filled 2013-12-05: qty 10

## 2013-12-05 MED ORDER — SODIUM CHLORIDE 0.9 % IV SOLN
Freq: Once | INTRAVENOUS | Status: AC
  Administered 2013-12-05: 14:00:00 via INTRAVENOUS

## 2013-12-05 NOTE — Patient Instructions (Signed)
[facility] Discharge Instructions for Patients Receiving Chemotherapy  Today you received the following chemotherapy agents gemcitibine. To help prevent nausea and vomiting after your treatment, we encourage you to take your nausea medication compazine.   If you develop nausea and vomiting that is not controlled by your nausea medication, call the clinic.   BELOW ARE SYMPTOMS THAT SHOULD BE REPORTED IMMEDIATELY:  *FEVER GREATER THAN 100.5 F  *CHILLS WITH OR WITHOUT FEVER  NAUSEA AND VOMITING THAT IS NOT CONTROLLED WITH YOUR NAUSEA MEDICATION  *UNUSUAL SHORTNESS OF BREATH  *UNUSUAL BRUISING OR BLEEDING  TENDERNESS IN MOUTH AND THROAT WITH OR WITHOUT PRESENCE OF ULCERS  *URINARY PROBLEMS  *BOWEL PROBLEMS  UNUSUAL RASH Items with * indicate a potential emergency and should be followed up as soon as possible.  Feel free to call the clinic you have any questions or concerns. The clinic phone number is [phone].

## 2013-12-05 NOTE — Progress Notes (Signed)
Discussed CBC with Dr. [name]   WBC is 2.7  And ANC is 1.1.. VO given and read back to treat with gemcitibine despite CBC.  Dr. [name] by to speak to patient.

## 2013-12-10 ENCOUNTER — Other Ambulatory Visit: Payer: Self-pay | Admitting: Internal Medicine

## 2013-12-10 DIAGNOSIS — C3490 Malignant neoplasm of unspecified part of unspecified bronchus or lung: Secondary | ICD-10-CM

## 2013-12-12 ENCOUNTER — Other Ambulatory Visit (HOSPITAL_BASED_OUTPATIENT_CLINIC_OR_DEPARTMENT_OTHER): Payer: Medicare Other

## 2013-12-12 DIAGNOSIS — C3490 Malignant neoplasm of unspecified part of unspecified bronchus or lung: Secondary | ICD-10-CM

## 2013-12-12 LAB — CBC WITH DIFFERENTIAL/PLATELET
BASO%: 1.5 % (ref 0.0–2.0)
BASOS ABS: 0 10*3/uL (ref 0.0–0.1)
EOS ABS: 0 10*3/uL (ref 0.0–0.5)
EOS%: 0.8 % (ref 0.0–7.0)
HCT: 34.5 % — ABNORMAL LOW (ref 34.8–46.6)
HGB: 11.4 g/dL — ABNORMAL LOW (ref 11.6–15.9)
LYMPH%: 34.1 % (ref 14.0–49.7)
MCH: 28.4 pg (ref 25.1–34.0)
MCHC: 33.1 g/dL (ref 31.5–36.0)
MCV: 85.8 fL (ref 79.5–101.0)
MONO#: 0.3 10*3/uL (ref 0.1–0.9)
MONO%: 11.4 % (ref 0.0–14.0)
NEUT%: 52.2 % (ref 38.4–76.8)
NEUTROS ABS: 1.2 10*3/uL — AB (ref 1.5–6.5)
PLATELETS: 104 10*3/uL — AB (ref 145–400)
RBC: 4.02 10*6/uL (ref 3.70–5.45)
RDW: 15.4 % — ABNORMAL HIGH (ref 11.2–14.5)
WBC: 2.3 10*3/uL — ABNORMAL LOW (ref 3.9–10.3)
lymph#: 0.8 10*3/uL — ABNORMAL LOW (ref 0.9–3.3)

## 2013-12-12 LAB — COMPREHENSIVE METABOLIC PANEL (CC13)
ALT: 134 U/L — ABNORMAL HIGH (ref 0–55)
AST: 84 U/L — ABNORMAL HIGH (ref 5–34)
Albumin: 3.6 g/dL (ref 3.5–5.0)
Alkaline Phosphatase: 76 U/L (ref 40–150)
Anion Gap: 11 mEq/L (ref 3–11)
BUN: 10.4 mg/dL (ref 7.0–26.0)
CO2: 24 mEq/L (ref 22–29)
Calcium: 8.9 mg/dL (ref 8.4–10.4)
Chloride: 103 mEq/L (ref 98–109)
Creatinine: 0.7 mg/dL (ref 0.6–1.1)
GLUCOSE: 173 mg/dL — AB (ref 70–140)
Potassium: 4.4 mEq/L (ref 3.5–5.1)
Sodium: 138 mEq/L (ref 136–145)
Total Bilirubin: 0.22 mg/dL (ref 0.20–1.20)
Total Protein: 7 g/dL (ref 6.4–8.3)

## 2013-12-12 LAB — TECHNOLOGIST REVIEW

## 2013-12-19 ENCOUNTER — Ambulatory Visit (HOSPITAL_BASED_OUTPATIENT_CLINIC_OR_DEPARTMENT_OTHER): Payer: Medicare Other | Admitting: Internal Medicine

## 2013-12-19 ENCOUNTER — Other Ambulatory Visit (HOSPITAL_BASED_OUTPATIENT_CLINIC_OR_DEPARTMENT_OTHER): Payer: Medicare Other

## 2013-12-19 ENCOUNTER — Ambulatory Visit (HOSPITAL_BASED_OUTPATIENT_CLINIC_OR_DEPARTMENT_OTHER): Payer: Medicare Other

## 2013-12-19 ENCOUNTER — Encounter: Payer: Self-pay | Admitting: Internal Medicine

## 2013-12-19 VITALS — BP 140/67 | HR 119 | Temp 98.2°F | Resp 18 | Ht 68.0 in | Wt 187.7 lb

## 2013-12-19 DIAGNOSIS — C3490 Malignant neoplasm of unspecified part of unspecified bronchus or lung: Secondary | ICD-10-CM

## 2013-12-19 DIAGNOSIS — Z5111 Encounter for antineoplastic chemotherapy: Secondary | ICD-10-CM

## 2013-12-19 DIAGNOSIS — C3410 Malignant neoplasm of upper lobe, unspecified bronchus or lung: Secondary | ICD-10-CM

## 2013-12-19 LAB — CBC WITH DIFFERENTIAL/PLATELET
BASO%: 0.4 % (ref 0.0–2.0)
BASOS ABS: 0 10*3/uL (ref 0.0–0.1)
EOS ABS: 0.1 10*3/uL (ref 0.0–0.5)
EOS%: 1.1 % (ref 0.0–7.0)
HEMATOCRIT: 37.3 % (ref 34.8–46.6)
HGB: 12.3 g/dL (ref 11.6–15.9)
LYMPH#: 1 10*3/uL (ref 0.9–3.3)
LYMPH%: 11.9 % — ABNORMAL LOW (ref 14.0–49.7)
MCH: 28.7 pg (ref 25.1–34.0)
MCHC: 33 g/dL (ref 31.5–36.0)
MCV: 86.9 fL (ref 79.5–101.0)
MONO#: 0.8 10*3/uL (ref 0.1–0.9)
MONO%: 9.6 % (ref 0.0–14.0)
NEUT#: 6.3 10*3/uL (ref 1.5–6.5)
NEUT%: 77 % — AB (ref 38.4–76.8)
Platelets: 255 10*3/uL (ref 145–400)
RBC: 4.29 10*6/uL (ref 3.70–5.45)
RDW: 19.1 % — ABNORMAL HIGH (ref 11.2–14.5)
WBC: 8.2 10*3/uL (ref 3.9–10.3)

## 2013-12-19 LAB — COMPREHENSIVE METABOLIC PANEL (CC13)
ALT: 32 U/L (ref 0–55)
ANION GAP: 11 meq/L (ref 3–11)
AST: 17 U/L (ref 5–34)
Albumin: 3.8 g/dL (ref 3.5–5.0)
Alkaline Phosphatase: 78 U/L (ref 40–150)
BUN: 8.3 mg/dL (ref 7.0–26.0)
CALCIUM: 9.3 mg/dL (ref 8.4–10.4)
CO2: 25 meq/L (ref 22–29)
CREATININE: 0.7 mg/dL (ref 0.6–1.1)
Chloride: 103 mEq/L (ref 98–109)
Glucose: 174 mg/dl — ABNORMAL HIGH (ref 70–140)
Potassium: 4.1 mEq/L (ref 3.5–5.1)
Sodium: 139 mEq/L (ref 136–145)
Total Bilirubin: 0.29 mg/dL (ref 0.20–1.20)
Total Protein: 7.3 g/dL (ref 6.4–8.3)

## 2013-12-19 MED ORDER — SODIUM CHLORIDE 0.9 % IV SOLN
Freq: Once | INTRAVENOUS | Status: AC
  Administered 2013-12-19: 12:00:00 via INTRAVENOUS

## 2013-12-19 MED ORDER — PROCHLORPERAZINE MALEATE 10 MG PO TABS
ORAL_TABLET | ORAL | Status: AC
  Filled 2013-12-19: qty 1

## 2013-12-19 MED ORDER — SODIUM CHLORIDE 0.9 % IJ SOLN
10.0000 mL | INTRAMUSCULAR | Status: DC | PRN
  Administered 2013-12-19: 10 mL
  Filled 2013-12-19: qty 10

## 2013-12-19 MED ORDER — SODIUM CHLORIDE 0.9 % IV SOLN
1000.0000 mg/m2 | Freq: Once | INTRAVENOUS | Status: AC
  Administered 2013-12-19: 2014 mg via INTRAVENOUS
  Filled 2013-12-19: qty 53

## 2013-12-19 MED ORDER — PROCHLORPERAZINE MALEATE 10 MG PO TABS
10.0000 mg | ORAL_TABLET | Freq: Once | ORAL | Status: AC
  Administered 2013-12-19: 10 mg via ORAL

## 2013-12-19 MED ORDER — LIDOCAINE-PRILOCAINE 2.5-2.5 % EX CREA
TOPICAL_CREAM | CUTANEOUS | Status: AC
  Filled 2013-12-19: qty 5

## 2013-12-19 MED ORDER — HEPARIN SOD (PORK) LOCK FLUSH 100 UNIT/ML IV SOLN
500.0000 [IU] | Freq: Once | INTRAVENOUS | Status: AC | PRN
  Administered 2013-12-19: 500 [IU]
  Filled 2013-12-19: qty 5

## 2013-12-19 NOTE — Progress Notes (Signed)
[facility] [phone]   [phone]  OFFICE PROGRESS NOTE  [name], NP [place] [address]  DIAGNOSIS: Recurrent non-small cell lung cancer, squamous cell carcinoma initially diagnosis stage IIIa (T1 N2 MX ) in August of 2010.   PRIOR THERAPY:  #1 status post concurrent chemoradiation with weekly carboplatin and paclitaxel, last dose of chemotherapy given 08/05/2011.  #2 status post consolidation chemotherapy with carboplatin paclitaxel last dose given 10/27/2009.  #3 Concurrent chemoradiation with weekly carboplatin for an AUC of 2 and paclitaxel at 45 mg per meter squared given concurrent with radiotherapy, last dose was given 09/20/2011.  #4 Systemic chemotherapy with gemcitabine 1000 mg meter squared on days 1 and 8 every 3 weeks status post 3 cycles with stable disease, last dose was given 12/20/2011.   CURRENT THERAPY: Systemic chemotherapy again with single agent gemcitabine 1000 mg/M2 on days 1 and 8 every 3 weeks. First cycle 11/07/2013.  INTERVAL HISTORY: Janice Ball 58 y.o. female returns to the clinic today for routine three-month follow up visit. She tolerated the last cycle of her systemic chemotherapy with gemcitabine fairly well with no significant complaints. She has mild sinus infection and bronchitis and started a few days ago but no significant fever or chills. She denied having any significant weight loss or night sweats. She has no chest pain, shortness of breath, cough or hemoptysis.  She denied any significant nausea or vomiting.  MEDICAL HISTORY: Past Medical History  Diagnosis Date  . Anxiety   . Hyperlipidemia   . Hypothyroidism   . COPD (chronic obstructive pulmonary disease)   . Arthritis     thumbs  . 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 Lung cancer     lung ca dx 8/10  . Lung cancer   . Diabetes mellitus     ALLERGIES:  is allergic to sulfonamide  derivatives; codeine; and penicillins.  MEDICATIONS:  Current Outpatient Prescriptions  Medication Sig Dispense Refill  . acetaminophen (TYLENOL) 325 MG tablet Take 650 mg by mouth every 6 (six) hours as needed.      Marland Kitchen albuterol (PROVENTIL HFA;VENTOLIN HFA) 108 (90 BASE) MCG/ACT inhaler Inhale 2 puffs into the lungs every 6 (six) hours as needed for wheezing or shortness of breath.      Marland Kitchen aspirin 325 MG tablet Take 325 mg by mouth daily.        . budesonide-formoterol (SYMBICORT) 160-4.5 MCG/ACT inhaler Inhale 2 puffs into the lungs 2 (two) times daily.      . cholecalciferol (VITAMIN D) 1000 UNITS tablet Take 1,000 Units by mouth daily.      . Chromium 1000 MCG TABS Take 1 tablet by mouth daily.        . Cyanocobalamin (VITAMIN B-12 PO) Take 1,500 mcg by mouth daily.      Marland Kitchen glimepiride (AMARYL) 2 MG tablet Take 2 mg by mouth daily before breakfast.       . Guaifenesin (MUCINEX MAXIMUM STRENGTH) 1200 MG TB12 Take 1 tablet by mouth every 12 (twelve) hours as needed. For cough      . KRILL OIL PO Take 1 tablet by mouth daily.      Marland Kitchen levothyroxine (SYNTHROID, LEVOTHROID) 125 MCG tablet Take 125 mcg by mouth daily before breakfast.       . lidocaine-prilocaine (EMLA) cream Apply 1 application topically as needed.  30 g  0  . LORazepam (ATIVAN) 1 MG tablet Take 1 mg  by mouth 4 (four) times daily as needed for anxiety.      . magnesium gluconate (MAGONATE) 500 MG tablet Take 500 mg by mouth daily.       . metFORMIN (GLUCOPHAGE) 1000 MG tablet Take 1,000 mg by mouth 2 (two) times daily with a meal.      . Multiple Vitamins-Minerals (CENTRUM SILVER PO) Take 1 tablet by mouth daily.        . prochlorperazine (COMPAZINE) 10 MG tablet TAKE 1 TABLET BY MOUTH EVERY 6 HOURS AS NEEDED FOR NAUSEA OR VOMITING  30 tablet  1  . sertraline (ZOLOFT) 100 MG tablet Take 150 mg by mouth at bedtime. Takes 1 1/2 tabs daily for total dose of 150 mg      . spironolactone (ALDACTONE) 25 MG tablet Take 25 mg by mouth every  morning.        No current facility-administered medications for this visit.    SURGICAL HISTORY:  Past Surgical History  Procedure Laterality Date  . Total abdominal hysterectomy    . Thyroidectomy, partial    . Tonsillectomy    . Tubal ligation      REVIEW OF SYSTEMS:  Constitutional: negative Eyes: negative Ears, nose, mouth, throat, and face: negative Respiratory: negative Cardiovascular: negative Gastrointestinal: negative Genitourinary:negative Integument/breast: negative Hematologic/lymphatic: negative Musculoskeletal:negative Neurological: negative Behavioral/Psych: negative Endocrine: negative Allergic/Immunologic: negative   PHYSICAL EXAMINATION: General appearance: alert, cooperative and no distress Head: Normocephalic, without obvious abnormality, atraumatic Neck: no adenopathy, no JVD, supple, symmetrical, trachea midline and thyroid not enlarged, symmetric, no tenderness/mass/nodules Lymph nodes: Cervical, supraclavicular, and axillary nodes normal. Resp: clear to auscultation bilaterally Back: symmetric, no curvature. ROM normal. No CVA tenderness. Cardio: regular rate and rhythm, S1, S2 normal, no murmur, click, rub or gallop GI: soft, non-tender; bowel sounds normal; no masses,  no organomegaly Extremities: extremities normal, atraumatic, no cyanosis or edema Neurologic: Alert and oriented X 3, normal strength and tone. Normal symmetric reflexes. Normal coordination and gait  ECOG PERFORMANCE STATUS: 0 - Asymptomatic  Blood pressure 140/67, pulse 119, temperature 98.2 F (36.8 C), temperature source Oral, resp. rate 18, height 5\' 8"  (1.727 m), weight 187 lb 11.2 oz (85.14 kg), SpO2 97.00%.  LABORATORY DATA: Lab Results  Component Value Date   WBC 8.2 12/19/2013   HGB 12.3 12/19/2013   HCT 37.3 12/19/2013   MCV 86.9 12/19/2013   PLT 255 12/19/2013      Chemistry      Component Value Date/Time   NA 138 12/12/2013 1234   NA 139 11/07/2013 0750   NA 141  04/04/2012 0945   K 4.4 12/12/2013 1234   K 4.0 11/07/2013 0750   K 4.7 04/04/2012 0945   CL 99 11/07/2013 0750   CL 101 04/06/2013 1041   CL 100 04/04/2012 0945   CO2 24 12/12/2013 1234   CO2 22 11/07/2013 0750   CO2 29 04/04/2012 0945   BUN 10.4 12/12/2013 1234   BUN 14 11/07/2013 0750   BUN 16 04/04/2012 0945   CREATININE 0.7 12/12/2013 1234   CREATININE 0.63 11/07/2013 0750   CREATININE 0.9 04/04/2012 0945      Component Value Date/Time   CALCIUM 8.9 12/12/2013 1234   CALCIUM 8.8 11/07/2013 0750   CALCIUM 8.4 04/04/2012 0945   ALKPHOS 76 12/12/2013 1234   ALKPHOS 77 11/07/2013 0750   ALKPHOS 61 04/04/2012 0945   AST 84* 12/12/2013 1234   AST 19 11/07/2013 0750   AST 31 04/04/2012 0945   ALT 134*  12/12/2013 1234   ALT 23 11/07/2013 0750   ALT 30 04/04/2012 0945   BILITOT 0.22 12/12/2013 1234   BILITOT <0.2* 11/07/2013 0750   BILITOT 0.50 04/04/2012 0945       RADIOGRAPHIC STUDIES:  ASSESSMENT AND PLAN: This is a very pleasant 58 years old white female with recurrent non-small cell lung cancer status post concurrent chemoradiation as well as consolidation chemotherapy and she is currently on observation.  The patient is tolerating her treatment with gemcitabine fairly well. I recommended for her to proceed with cycle #3 today as scheduled. She would come back for followup visit in 3 weeks with the next cycle of her treatment after repeating CT scan of the chest, abdomen and pelvis for restaging of her disease. The patient voices understanding of current disease status and treatment options and is in agreement with the current care plan.  All questions were answered. The patient knows to call the clinic with any problems, questions or concerns. We can certainly see the patient much sooner if necessary.   Disclaimer: This note was dictated with voice recognition software. Similar sounding words can inadvertently be transcribed and may not be corrected upon review.

## 2013-12-19 NOTE — Patient Instructions (Signed)
[facility] Discharge Instructions for Patients Receiving Chemotherapy  Today you received the following chemotherapy agents:  Gemzar  To help prevent nausea and vomiting after your treatment, we encourage you to take your nausea medication as ordered per MD.   If you develop nausea and vomiting that is not controlled by your nausea medication, call the clinic.   BELOW ARE SYMPTOMS THAT SHOULD BE REPORTED IMMEDIATELY:  *FEVER GREATER THAN 100.5 F  *CHILLS WITH OR WITHOUT FEVER  NAUSEA AND VOMITING THAT IS NOT CONTROLLED WITH YOUR NAUSEA MEDICATION  *UNUSUAL SHORTNESS OF BREATH  *UNUSUAL BRUISING OR BLEEDING  TENDERNESS IN MOUTH AND THROAT WITH OR WITHOUT PRESENCE OF ULCERS  *URINARY PROBLEMS  *BOWEL PROBLEMS  UNUSUAL RASH Items with * indicate a potential emergency and should be followed up as soon as possible.  Feel free to call the clinic you have any questions or concerns. The clinic phone number is [phone].

## 2013-12-20 ENCOUNTER — Telehealth: Payer: Self-pay | Admitting: Internal Medicine

## 2013-12-20 NOTE — Telephone Encounter (Signed)
lvm for pt regarding to appt nxt week and extended sched...mailed pt appt sched/avs and letter

## 2013-12-26 ENCOUNTER — Ambulatory Visit (HOSPITAL_BASED_OUTPATIENT_CLINIC_OR_DEPARTMENT_OTHER): Payer: Medicare Other

## 2013-12-26 ENCOUNTER — Other Ambulatory Visit (HOSPITAL_BASED_OUTPATIENT_CLINIC_OR_DEPARTMENT_OTHER): Payer: Medicare Other

## 2013-12-26 VITALS — BP 127/77 | HR 108 | Temp 97.3°F | Resp 18

## 2013-12-26 DIAGNOSIS — C3490 Malignant neoplasm of unspecified part of unspecified bronchus or lung: Secondary | ICD-10-CM

## 2013-12-26 DIAGNOSIS — Z5111 Encounter for antineoplastic chemotherapy: Secondary | ICD-10-CM

## 2013-12-26 DIAGNOSIS — C3410 Malignant neoplasm of upper lobe, unspecified bronchus or lung: Secondary | ICD-10-CM

## 2013-12-26 LAB — CBC WITH DIFFERENTIAL/PLATELET
BASO%: 1.6 % (ref 0.0–2.0)
Basophils Absolute: 0.1 10*3/uL (ref 0.0–0.1)
EOS%: 0.8 % (ref 0.0–7.0)
Eosinophils Absolute: 0 10*3/uL (ref 0.0–0.5)
HEMATOCRIT: 34.3 % — AB (ref 34.8–46.6)
HGB: 11.3 g/dL — ABNORMAL LOW (ref 11.6–15.9)
LYMPH%: 23.3 % (ref 14.0–49.7)
MCH: 28.5 pg (ref 25.1–34.0)
MCHC: 32.9 g/dL (ref 31.5–36.0)
MCV: 86.4 fL (ref 79.5–101.0)
MONO#: 0.6 10*3/uL (ref 0.1–0.9)
MONO%: 17.3 % — ABNORMAL HIGH (ref 0.0–14.0)
NEUT%: 57 % (ref 38.4–76.8)
NEUTROS ABS: 2.1 10*3/uL (ref 1.5–6.5)
Platelets: 238 10*3/uL (ref 145–400)
RBC: 3.97 10*6/uL (ref 3.70–5.45)
RDW: 18 % — AB (ref 11.2–14.5)
WBC: 3.7 10*3/uL — ABNORMAL LOW (ref 3.9–10.3)
lymph#: 0.9 10*3/uL (ref 0.9–3.3)

## 2013-12-26 LAB — COMPREHENSIVE METABOLIC PANEL (CC13)
ALBUMIN: 3.4 g/dL — AB (ref 3.5–5.0)
ALK PHOS: 73 U/L (ref 40–150)
ALT: 39 U/L (ref 0–55)
AST: 29 U/L (ref 5–34)
Anion Gap: 10 mEq/L (ref 3–11)
BUN: 6.8 mg/dL — ABNORMAL LOW (ref 7.0–26.0)
CO2: 24 mEq/L (ref 22–29)
Calcium: 8.6 mg/dL (ref 8.4–10.4)
Chloride: 102 mEq/L (ref 98–109)
Creatinine: 0.7 mg/dL (ref 0.6–1.1)
GLUCOSE: 187 mg/dL — AB (ref 70–140)
POTASSIUM: 4.1 meq/L (ref 3.5–5.1)
Sodium: 137 mEq/L (ref 136–145)
TOTAL PROTEIN: 6.7 g/dL (ref 6.4–8.3)
Total Bilirubin: 0.21 mg/dL (ref 0.20–1.20)

## 2013-12-26 MED ORDER — SODIUM CHLORIDE 0.9 % IJ SOLN
10.0000 mL | INTRAMUSCULAR | Status: DC | PRN
  Administered 2013-12-26: 10 mL
  Filled 2013-12-26: qty 10

## 2013-12-26 MED ORDER — HEPARIN SOD (PORK) LOCK FLUSH 100 UNIT/ML IV SOLN
500.0000 [IU] | Freq: Once | INTRAVENOUS | Status: AC | PRN
  Administered 2013-12-26: 500 [IU]
  Filled 2013-12-26: qty 5

## 2013-12-26 MED ORDER — SODIUM CHLORIDE 0.9 % IV SOLN
1000.0000 mg/m2 | Freq: Once | INTRAVENOUS | Status: AC
  Administered 2013-12-26: 2014 mg via INTRAVENOUS
  Filled 2013-12-26: qty 52.97

## 2013-12-26 MED ORDER — SODIUM CHLORIDE 0.9 % IV SOLN
Freq: Once | INTRAVENOUS | Status: AC
  Administered 2013-12-26: 11:00:00 via INTRAVENOUS

## 2013-12-26 MED ORDER — PROCHLORPERAZINE MALEATE 10 MG PO TABS
10.0000 mg | ORAL_TABLET | Freq: Once | ORAL | Status: AC
  Administered 2013-12-26: 10 mg via ORAL

## 2013-12-26 MED ORDER — PROCHLORPERAZINE MALEATE 10 MG PO TABS
ORAL_TABLET | ORAL | Status: AC
  Filled 2013-12-26: qty 1

## 2013-12-26 NOTE — Patient Instructions (Signed)
[facility] Discharge Instructions for Patients Receiving Chemotherapy  Today you received the following chemotherapy agent Gemzar.  To help prevent nausea and vomiting after your treatment, we encourage you to take your nausea medication.   If you develop nausea and vomiting that is not controlled by your nausea medication, call the clinic.   BELOW ARE SYMPTOMS THAT SHOULD BE REPORTED IMMEDIATELY:  *FEVER GREATER THAN 100.5 F  *CHILLS WITH OR WITHOUT FEVER  NAUSEA AND VOMITING THAT IS NOT CONTROLLED WITH YOUR NAUSEA MEDICATION  *UNUSUAL SHORTNESS OF BREATH  *UNUSUAL BRUISING OR BLEEDING  TENDERNESS IN MOUTH AND THROAT WITH OR WITHOUT PRESENCE OF ULCERS  *URINARY PROBLEMS  *BOWEL PROBLEMS  UNUSUAL RASH Items with * indicate a potential emergency and should be followed up as soon as possible.  Feel free to call the clinic you have any questions or concerns. The clinic phone number is [phone].

## 2013-12-28 ENCOUNTER — Encounter (HOSPITAL_COMMUNITY): Payer: Self-pay | Admitting: Emergency Medicine

## 2013-12-28 ENCOUNTER — Inpatient Hospital Stay (HOSPITAL_COMMUNITY)
Admission: EM | Admit: 2013-12-28 | Discharge: 2014-01-01 | DRG: 194 | Disposition: A | Discharge status: Home / Self Care | Payer: Medicare Other | Attending: Internal Medicine | Admitting: Internal Medicine

## 2013-12-28 ENCOUNTER — Emergency Department (HOSPITAL_COMMUNITY): Payer: Medicare Other

## 2013-12-28 DIAGNOSIS — R509 Fever, unspecified: Secondary | ICD-10-CM

## 2013-12-28 DIAGNOSIS — M810 Age-related osteoporosis without current pathological fracture: Secondary | ICD-10-CM

## 2013-12-28 DIAGNOSIS — IMO0001 Reserved for inherently not codable concepts without codable children: Secondary | ICD-10-CM

## 2013-12-28 DIAGNOSIS — C3411 Malignant neoplasm of upper lobe, right bronchus or lung: Secondary | ICD-10-CM | POA: Diagnosis present

## 2013-12-28 DIAGNOSIS — R490 Dysphonia: Secondary | ICD-10-CM

## 2013-12-28 DIAGNOSIS — Z9221 Personal history of antineoplastic chemotherapy: Secondary | ICD-10-CM

## 2013-12-28 DIAGNOSIS — R5383 Other fatigue: Secondary | ICD-10-CM

## 2013-12-28 DIAGNOSIS — D6959 Other secondary thrombocytopenia: Secondary | ICD-10-CM | POA: Diagnosis present

## 2013-12-28 DIAGNOSIS — R059 Cough, unspecified: Secondary | ICD-10-CM

## 2013-12-28 DIAGNOSIS — R05 Cough: Secondary | ICD-10-CM

## 2013-12-28 DIAGNOSIS — R000 Tachycardia, unspecified: Secondary | ICD-10-CM | POA: Diagnosis present

## 2013-12-28 DIAGNOSIS — E860 Dehydration: Secondary | ICD-10-CM | POA: Diagnosis present

## 2013-12-28 DIAGNOSIS — E039 Hypothyroidism, unspecified: Secondary | ICD-10-CM | POA: Diagnosis present

## 2013-12-28 DIAGNOSIS — R0602 Shortness of breath: Secondary | ICD-10-CM

## 2013-12-28 DIAGNOSIS — F17200 Nicotine dependence, unspecified, uncomplicated: Secondary | ICD-10-CM

## 2013-12-28 DIAGNOSIS — J3489 Other specified disorders of nose and nasal sinuses: Secondary | ICD-10-CM

## 2013-12-28 DIAGNOSIS — T380X5A Adverse effect of glucocorticoids and synthetic analogues, initial encounter: Secondary | ICD-10-CM | POA: Diagnosis present

## 2013-12-28 DIAGNOSIS — E872 Acidosis, unspecified: Secondary | ICD-10-CM | POA: Diagnosis present

## 2013-12-28 DIAGNOSIS — E119 Type 2 diabetes mellitus without complications: Secondary | ICD-10-CM | POA: Diagnosis present

## 2013-12-28 DIAGNOSIS — Z7982 Long term (current) use of aspirin: Secondary | ICD-10-CM

## 2013-12-28 DIAGNOSIS — J4489 Other specified chronic obstructive pulmonary disease: Secondary | ICD-10-CM

## 2013-12-28 DIAGNOSIS — I498 Other specified cardiac arrhythmias: Secondary | ICD-10-CM | POA: Diagnosis present

## 2013-12-28 DIAGNOSIS — F411 Generalized anxiety disorder: Secondary | ICD-10-CM

## 2013-12-28 DIAGNOSIS — Z79899 Other long term (current) drug therapy: Secondary | ICD-10-CM

## 2013-12-28 DIAGNOSIS — C3410 Malignant neoplasm of upper lobe, unspecified bronchus or lung: Secondary | ICD-10-CM | POA: Diagnosis present

## 2013-12-28 DIAGNOSIS — D72819 Decreased white blood cell count, unspecified: Secondary | ICD-10-CM | POA: Diagnosis present

## 2013-12-28 DIAGNOSIS — E785 Hyperlipidemia, unspecified: Secondary | ICD-10-CM | POA: Diagnosis present

## 2013-12-28 DIAGNOSIS — C3490 Malignant neoplasm of unspecified part of unspecified bronchus or lung: Secondary | ICD-10-CM

## 2013-12-28 DIAGNOSIS — Z923 Personal history of irradiation: Secondary | ICD-10-CM

## 2013-12-28 DIAGNOSIS — T451X5A Adverse effect of antineoplastic and immunosuppressive drugs, initial encounter: Secondary | ICD-10-CM | POA: Diagnosis present

## 2013-12-28 DIAGNOSIS — J441 Chronic obstructive pulmonary disease with (acute) exacerbation: Secondary | ICD-10-CM | POA: Diagnosis present

## 2013-12-28 DIAGNOSIS — J449 Chronic obstructive pulmonary disease, unspecified: Secondary | ICD-10-CM

## 2013-12-28 DIAGNOSIS — J189 Pneumonia, unspecified organism: Principal | ICD-10-CM | POA: Diagnosis present

## 2013-12-28 DIAGNOSIS — D638 Anemia in other chronic diseases classified elsewhere: Secondary | ICD-10-CM | POA: Diagnosis present

## 2013-12-28 LAB — COMPREHENSIVE METABOLIC PANEL
ALT: 49 U/L — ABNORMAL HIGH (ref 0–35)
AST: 48 U/L — ABNORMAL HIGH (ref 0–37)
Albumin: 3.5 g/dL (ref 3.5–5.2)
Alkaline Phosphatase: 71 U/L (ref 39–117)
BUN: 7 mg/dL (ref 6–23)
CO2: 22 mEq/L (ref 19–32)
Calcium: 8.7 mg/dL (ref 8.4–10.5)
Chloride: 96 mEq/L (ref 96–112)
Creatinine, Ser: 0.63 mg/dL (ref 0.50–1.10)
GFR calc Af Amer: >90 mL/min (ref >90)
GFR calc non Af Amer: >90 mL/min (ref >90)
Glucose, Bld: 141 mg/dL — ABNORMAL HIGH (ref 70–99)
Potassium: 4.2 mEq/L (ref 3.7–5.3)
Sodium: 136 mEq/L — ABNORMAL LOW (ref 137–147)
Total Bilirubin: 0.4 mg/dL (ref 0.3–1.2)
Total Protein: 7.1 g/dL (ref 6.0–8.3)

## 2013-12-28 LAB — URINALYSIS, ROUTINE W REFLEX MICROSCOPIC
Bilirubin Urine: NEGATIVE
Glucose, UA: NEGATIVE mg/dL
Hgb urine dipstick: NEGATIVE
Ketones, ur: NEGATIVE mg/dL
Leukocytes, UA: NEGATIVE
Nitrite: NEGATIVE
Protein, ur: NEGATIVE mg/dL
Specific Gravity, Urine: 1.011 (ref 1.005–1.030)
Urobilinogen, UA: 0.2 mg/dL (ref 0.0–1.0)
pH: 7 (ref 5.0–8.0)

## 2013-12-28 LAB — CBC WITH DIFFERENTIAL/PLATELET
Basophils Absolute: 0 10*3/uL (ref 0.0–0.1)
Basophils Relative: 0 % (ref 0–1)
Eosinophils Absolute: 0.1 10*3/uL (ref 0.0–0.7)
Eosinophils Relative: 2 % (ref 0–5)
HCT: 32.1 % — ABNORMAL LOW (ref 36.0–46.0)
Hemoglobin: 11.1 g/dL — ABNORMAL LOW (ref 12.0–15.0)
Lymphocytes Relative: 9 % — ABNORMAL LOW (ref 12–46)
Lymphs Abs: 0.6 10*3/uL — ABNORMAL LOW (ref 0.7–4.0)
MCH: 29.7 pg (ref 26.0–34.0)
MCHC: 34.6 g/dL (ref 30.0–36.0)
MCV: 85.8 fL (ref 78.0–100.0)
Monocytes Absolute: 0.1 10*3/uL (ref 0.1–1.0)
Monocytes Relative: 1 % — ABNORMAL LOW (ref 3–12)
Neutro Abs: 5.9 10*3/uL (ref 1.7–7.7)
Neutrophils Relative %: 88 % — ABNORMAL HIGH (ref 43–77)
Platelets: 161 10*3/uL (ref 150–400)
RBC: 3.74 MIL/uL — ABNORMAL LOW (ref 3.87–5.11)
RDW: 18.1 % — ABNORMAL HIGH (ref 11.5–15.5)
WBC: 6.7 10*3/uL (ref 4.0–10.5)

## 2013-12-28 LAB — I-STAT CG4 LACTIC ACID, ED: Lactic Acid, Venous: 2.67 mmol/L — ABNORMAL HIGH (ref 0.5–2.2)

## 2013-12-28 MED ORDER — ALBUTEROL (5 MG/ML) CONTINUOUS INHALATION SOLN
10.0000 mg/h | INHALATION_SOLUTION | RESPIRATORY_TRACT | Status: AC
  Administered 2013-12-28: 10 mg/h via RESPIRATORY_TRACT
  Filled 2013-12-28: qty 20

## 2013-12-28 MED ORDER — IPRATROPIUM BROMIDE 0.02 % IN SOLN
0.5000 mg | Freq: Once | RESPIRATORY_TRACT | Status: AC
  Administered 2013-12-28: 0.5 mg via RESPIRATORY_TRACT
  Filled 2013-12-28: qty 2.5

## 2013-12-28 NOTE — ED Provider Notes (Signed)
CSN: [id number]     Arrival date & time 12/28/13  2207 History   First MD Initiated Contact with Patient 12/28/13 2218     Chief Complaint  Patient presents with  . Fever  . Chills  . Nausea     (Consider location/radiation/quality/duration/timing/severity/associated sxs/prior Treatment) The history is provided by the patient and medical records. No language interpreter was used.    Janice Ball is a 58 y.o. female  with a hx of COPD, NIDDM, squamous cell carcinoma presents to the Emergency Department complaining of gradual, persistent, progressively worsening measured fever to 101, nausea and chills onset 6pm.  She is taking chemo for her lung cancer and her last treatment was 12/26/13.  Pt reports SOB new this AM and she has been using her albuterol inhaler throughout the day without relief.  She also reports worsened cough today with associated minor hemoptysis.  She continues to report nasal congestion and post nasal drip.  Compazine has dealt with the nausea and she has not taken any antipyretics.  Pt denies headache, neck pain, chest pain, abd pain, vomiting, diarrhea, weakness, dizziness.  She reports concerns about neutropenia as her counts were on the low side on 12/26/13.     Past Medical History  Diagnosis Date  . Anxiety   . Hyperlipidemia   . Hypothyroidism   . COPD (chronic obstructive pulmonary disease)   . Arthritis     thumbs  . 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 Lung cancer     lung ca dx 8/10  . Lung cancer   . Diabetes mellitus    Past Surgical History  Procedure Laterality Date  . Total abdominal hysterectomy    . Thyroidectomy, partial    . Tonsillectomy    . Tubal ligation     Family History  Problem Relation Age of Onset  . Colon cancer Mother   . Cancer Mother     colon   History  Substance Use Topics  . Smoking status: Former Smoker -- 1.00 packs/day for 45 years    Types: Cigarettes    Quit date: 03/07/2013  . Smokeless  tobacco: Never Used     Comment: pt. started back smoking 10/18/2012, half a pack a day  . Alcohol Use: No   OB History   Grav Para Term Preterm Abortions TAB SAB Ect Mult Living                 Review of Systems  Constitutional: Positive for fever and fatigue. Negative for diaphoresis, appetite change and unexpected weight change.  HENT: Negative for mouth sores.   Eyes: Negative for visual disturbance.  Respiratory: Positive for shortness of breath. Negative for cough, chest tightness and wheezing.   Cardiovascular: Positive for palpitations. Negative for chest pain.  Gastrointestinal: Positive for nausea. Negative for vomiting, abdominal pain, diarrhea and constipation.  Endocrine: Negative for polydipsia, polyphagia and polyuria.  Genitourinary: Negative for dysuria, urgency, frequency and hematuria.  Musculoskeletal: Negative for back pain and neck stiffness.  Skin: Negative for rash.  Allergic/Immunologic: Negative for immunocompromised state.  Neurological: Negative for syncope, light-headedness and headaches.  Hematological: Does not bruise/bleed easily.  Psychiatric/Behavioral: Negative for sleep disturbance. The patient is not nervous/anxious.       Allergies  Sulfonamide derivatives; Codeine; and Penicillins  Home Medications   Current Outpatient Rx  Name  Route  Sig  Dispense  Refill  . acetaminophen (TYLENOL) 325 MG tablet   Oral   Take  650 mg by mouth every 6 (six) hours as needed for mild pain or headache.          . albuterol (PROVENTIL HFA;VENTOLIN HFA) 108 (90 BASE) MCG/ACT inhaler   Inhalation   Inhale 2 puffs into the lungs every 6 (six) hours as needed for wheezing or shortness of breath.         Marland Kitchen aspirin 325 MG tablet   Oral   Take 325 mg by mouth every morning.          . budesonide-formoterol (SYMBICORT) 160-4.5 MCG/ACT inhaler   Inhalation   Inhale 2 puffs into the lungs 2 (two) times daily.         . cholecalciferol (VITAMIN D) 1000  UNITS tablet   Oral   Take 1,000 Units by mouth every morning.          . Chromium 1000 MCG TABS   Oral   Take 1 tablet by mouth every morning.          . cyanocobalamin 1000 MCG tablet   Oral   Take 1,500 mcg by mouth every morning.         Marland Kitchen glimepiride (AMARYL) 2 MG tablet   Oral   Take 2 mg by mouth daily before breakfast.          . guaiFENesin (MUCINEX) 600 MG 12 hr tablet   Oral   Take 1,200 mg by mouth 2 (two) times daily.         Marland Kitchen KRILL OIL PO   Oral   Take 1 tablet by mouth every morning.          Marland Kitchen levothyroxine (SYNTHROID, LEVOTHROID) 125 MCG tablet   Oral   Take 125 mcg by mouth daily before breakfast.          . lidocaine-prilocaine (EMLA) cream   Topical   Apply 1 application topically as needed (skin numbing).         . LORazepam (ATIVAN) 1 MG tablet   Oral   Take 1 mg by mouth 4 (four) times daily as needed for anxiety.         . magnesium gluconate (MAGONATE) 500 MG tablet   Oral   Take 500 mg by mouth every morning.          . metFORMIN (GLUCOPHAGE) 1000 MG tablet   Oral   Take 1,000 mg by mouth 2 (two) times daily with a meal.         . Multiple Vitamin (MULTIVITAMIN WITH MINERALS) TABS tablet   Oral   Take 1 tablet by mouth every morning.         . prochlorperazine (COMPAZINE) 10 MG tablet   Oral   Take 10 mg by mouth every 6 (six) hours as needed for nausea or vomiting.         . sertraline (ZOLOFT) 100 MG tablet   Oral   Take 150 mg by mouth at bedtime. Takes 1 1/2 tabs daily for total dose of 150 mg         . spironolactone (ALDACTONE) 25 MG tablet   Oral   Take 25 mg by mouth every morning.           BP 133/75  Pulse 135  Temp(Src) 99.9 F (37.7 C)  Resp 20  Ht 5\' 8"  (1.727 m)  Wt 187 lb (84.823 kg)  BMI 28.44 kg/m2  SpO2 95% Physical Exam  Nursing note and vitals reviewed. Constitutional: She is oriented  to person, place, and time. She appears well-developed and well-nourished. No  distress.  Awake, alert, nontoxic appearance  HENT:  Head: Normocephalic and atraumatic.  Mouth/Throat: Oropharynx is clear and moist. No oropharyngeal exudate.  Eyes: Conjunctivae are normal. No scleral icterus.  Neck: Normal range of motion. Neck supple.  Cardiovascular: Regular rhythm, normal heart sounds and intact distal pulses.   No murmur heard. tachycardia   Pulmonary/Chest: Effort normal and breath sounds normal. No respiratory distress. She has no wheezes.  Wheezing throughout and rhonchi on right alone No pain to palpation of the chest wall   Abdominal: Soft. Bowel sounds are normal. She exhibits no distension and no mass. There is no tenderness. There is no rebound and no guarding.  Soft and nontender  Musculoskeletal: Normal range of motion. She exhibits no edema.  Lymphadenopathy:    She has no cervical adenopathy.  Neurological: She is alert and oriented to person, place, and time. She exhibits normal muscle tone. Coordination normal.  Speech is clear and goal oriented Moves extremities without ataxia  Skin: Skin is warm and dry. She is not diaphoretic. No erythema.  Psychiatric: She has a normal mood and affect.    ED Course  Procedures (including critical care time) Labs Review Labs Reviewed  CBC WITH DIFFERENTIAL - Abnormal; Notable for the following:    RBC 3.74 (*)    Hemoglobin 11.1 (*)    HCT 32.1 (*)    RDW 18.1 (*)    Neutrophils Relative % 88 (*)    Lymphocytes Relative 9 (*)    Lymphs Abs 0.6 (*)    Monocytes Relative 1 (*)    All other components within normal limits  COMPREHENSIVE METABOLIC PANEL - Abnormal; Notable for the following:    Sodium 136 (*)    Glucose, Bld 141 (*)    AST 48 (*)    ALT 49 (*)    All other components within normal limits  I-STAT CG4 LACTIC ACID, ED - Abnormal; Notable for the following:    Lactic Acid, Venous 2.67 (*)    All other components within normal limits  CULTURE, BLOOD (ROUTINE X 2)  CULTURE, BLOOD  (ROUTINE X 2)  URINE CULTURE  URINALYSIS, ROUTINE W REFLEX MICROSCOPIC   Imaging Review Dg Chest Port 1 View  12/28/2013   CLINICAL DATA:  Cough, fever and chest congestion. Treated for lung cancer. Ex-smoker.  EXAM: PORTABLE CHEST - 1 VIEW  COMPARISON:  Previous examinations, including the PET-CT dated 10/24/2013.  FINDINGS: Stable mass like area in the medial aspect of the right upper lobe with previously demonstrated postradiation changes at that location. The remainder of the lungs are clear and mildly hyperexpanded. Stable right jugular port catheter. Normal sized heart. Mild scoliosis.  IMPRESSION: No acute abnormality. COPD and treated right upper lobe lung cancer.   Electronically Signed   By: Enrique Sack M.D.   On: 12/28/2013 23:59     EKG Interpretation None      MDM   Final diagnoses:  Fever  SOB (shortness of breath)  Cough  CARCINOMA, LUNG, SQUAMOUS CELL   Janice Ball presents with cough, SOB and fever.  Pt expresses concern about possible neutropenia.    Record review shows that pt is receiving Systemic chemotherapy with gemcitabine 1000 mg meter squared on days 1 and 8 every 3 weeks status post 3 cycles with stable disease, last dose was given 12/26/2013 as treatment for her squamous cell carcinoma.    Pt with wheezing throughout  and ronchi on the right.  Concern for pneumonia.  Will give continuous nebulizer.    12:29 AM Pt with persistent SOB, tachycardia.  Nebulizer almost complete with persistent rhonchi on the right, worse in the lower lobe and clear breath sounds on the left.  Pt without neutropenia and mild anemia.  Labs otherwise at baseline.  No evidence of UTI.  CXR with right upper lobe cancer, but no evidence of consolidation.  I personally reviewed the imaging tests through PACS system  I reviewed available ER/hospitalization records through the EMR  Will CT chest for PE rule out and to confirm no pneumonia.  If pt remains tachycardic, will plan for  admission.    The patient was discussed with and seen by Dr. Wilson Singer who agrees with the treatment plan.  1:46 AM Pt continues to have SOB and tachycardia.  Pt treated for CAP.  Pt reports feeling much worse that when she came and continues to cough and have right sided rhonchi on exam. I believe this is a pneumonia in spite of normal radiographs.  Will proceed with admission.    PCP: [name], DNP Oncologist: [facility]   2:14 AM Discussed with Jana Hakim, MD who recommend ECG to r/o a-fib and lopressor 2.5mg  to decrease HR.  Discussed with Linton Flemings, MD who will monitor and update Dr. Arnoldo Morale.  If HR resolves pt can be admitted to tele, if no improvement or a-fib will need a step-down bed.    Janice Soho Tashiana Lamarca, PA-C 12/29/13 [phone]

## 2013-12-28 NOTE — ED Notes (Signed)
RT at bedside.

## 2013-12-28 NOTE — ED Notes (Signed)
Pt states she has lung cancer and currently taking chemo,  Not sure if she is neutropenic she says,  She does however have on mask,  Pt says it all started about 6 pm   Fever,  Chills, and nausea.

## 2013-12-28 NOTE — ED Notes (Signed)
RT notified of pending orders.

## 2013-12-29 ENCOUNTER — Other Ambulatory Visit: Payer: Self-pay

## 2013-12-29 ENCOUNTER — Emergency Department (HOSPITAL_COMMUNITY): Payer: Medicare Other

## 2013-12-29 DIAGNOSIS — C3490 Malignant neoplasm of unspecified part of unspecified bronchus or lung: Secondary | ICD-10-CM

## 2013-12-29 DIAGNOSIS — I498 Other specified cardiac arrhythmias: Secondary | ICD-10-CM

## 2013-12-29 DIAGNOSIS — E872 Acidosis, unspecified: Secondary | ICD-10-CM | POA: Diagnosis present

## 2013-12-29 DIAGNOSIS — J189 Pneumonia, unspecified organism: Principal | ICD-10-CM

## 2013-12-29 DIAGNOSIS — E039 Hypothyroidism, unspecified: Secondary | ICD-10-CM

## 2013-12-29 DIAGNOSIS — R000 Tachycardia, unspecified: Secondary | ICD-10-CM | POA: Diagnosis present

## 2013-12-29 DIAGNOSIS — J449 Chronic obstructive pulmonary disease, unspecified: Secondary | ICD-10-CM

## 2013-12-29 DIAGNOSIS — E785 Hyperlipidemia, unspecified: Secondary | ICD-10-CM

## 2013-12-29 DIAGNOSIS — R509 Fever, unspecified: Secondary | ICD-10-CM

## 2013-12-29 DIAGNOSIS — E119 Type 2 diabetes mellitus without complications: Secondary | ICD-10-CM

## 2013-12-29 LAB — CBC
HEMATOCRIT: 28.5 % — AB (ref 36.0–46.0)
Hemoglobin: 9.4 g/dL — ABNORMAL LOW (ref 12.0–15.0)
MCH: 28.9 pg (ref 26.0–34.0)
MCHC: 33 g/dL (ref 30.0–36.0)
MCV: 87.7 fL (ref 78.0–100.0)
Platelets: 148 10*3/uL — ABNORMAL LOW (ref 150–400)
RBC: 3.25 MIL/uL — ABNORMAL LOW (ref 3.87–5.11)
RDW: 18.2 % — AB (ref 11.5–15.5)
WBC: 5.5 10*3/uL (ref 4.0–10.5)

## 2013-12-29 LAB — BASIC METABOLIC PANEL
BUN: 6 mg/dL (ref 6–23)
CHLORIDE: 102 meq/L (ref 96–112)
CO2: 23 meq/L (ref 19–32)
Calcium: 7.9 mg/dL — ABNORMAL LOW (ref 8.4–10.5)
Creatinine, Ser: 0.68 mg/dL (ref 0.50–1.10)
GFR calc Af Amer: >90 mL/min (ref >90)
GFR calc non Af Amer: >90 mL/min (ref >90)
Glucose, Bld: 212 mg/dL — ABNORMAL HIGH (ref 70–99)
Potassium: 3.9 mEq/L (ref 3.7–5.3)
Sodium: 138 mEq/L (ref 137–147)

## 2013-12-29 LAB — TSH: TSH: 7.922 u[IU]/mL — ABNORMAL HIGH (ref 0.350–4.500)

## 2013-12-29 LAB — HEMOGLOBIN A1C
Hgb A1c MFr Bld: 7.3 % — ABNORMAL HIGH (ref <5.7)
MEAN PLASMA GLUCOSE: 163 mg/dL — AB (ref <117)

## 2013-12-29 LAB — GLUCOSE, CAPILLARY: Glucose-Capillary: 396 mg/dL — ABNORMAL HIGH (ref 70–99)

## 2013-12-29 MED ORDER — METFORMIN HCL 500 MG PO TABS: 1000.0000 mg | ORAL_TABLET | Freq: Two times a day (BID) | ORAL | Status: DC

## 2013-12-29 MED ORDER — VITAMIN D3 25 MCG (1000 UNIT) PO TABS
1000.0000 [IU] | ORAL_TABLET | Freq: Every morning | ORAL | Status: DC
  Administered 2013-12-29 – 2014-01-01 (×4): 1000 [IU] via ORAL
  Filled 2013-12-29 (×4): qty 1

## 2013-12-29 MED ORDER — SERTRALINE HCL 50 MG PO TABS
150.0000 mg | ORAL_TABLET | Freq: Every day | ORAL | Status: DC
  Administered 2013-12-29 – 2013-12-31 (×3): 150 mg via ORAL
  Filled 2013-12-29 (×4): qty 1

## 2013-12-29 MED ORDER — METHYLPREDNISOLONE SODIUM SUCC 125 MG IJ SOLR
125.0000 mg | Freq: Once | INTRAMUSCULAR | Status: AC
  Administered 2013-12-29: 125 mg via INTRAVENOUS
  Filled 2013-12-29: qty 2

## 2013-12-29 MED ORDER — AZITHROMYCIN 500 MG PO TABS
500.0000 mg | ORAL_TABLET | Freq: Every day | ORAL | Status: AC
  Administered 2013-12-30 – 2013-12-31 (×2): 500 mg via ORAL
  Filled 2013-12-29 (×2): qty 1

## 2013-12-29 MED ORDER — ENOXAPARIN SODIUM 40 MG/0.4ML ~~LOC~~ SOLN
40.0000 mg | SUBCUTANEOUS | Status: DC
  Administered 2013-12-29 – 2014-01-01 (×4): 40 mg via SUBCUTANEOUS
  Filled 2013-12-29 (×4): qty 0.4

## 2013-12-29 MED ORDER — ACETAMINOPHEN 650 MG RE SUPP: 650.0000 mg | Freq: Four times a day (QID) | RECTAL | Status: DC | PRN

## 2013-12-29 MED ORDER — DEXTROSE 5 % IV SOLN
1.0000 g | INTRAVENOUS | Status: DC
  Administered 2013-12-30 – 2014-01-01 (×3): 1 g via INTRAVENOUS
  Filled 2013-12-29 (×3): qty 10

## 2013-12-29 MED ORDER — MAGNESIUM GLUCONATE 500 MG PO TABS
500.0000 mg | ORAL_TABLET | Freq: Every morning | ORAL | Status: DC
  Administered 2013-12-29 – 2013-12-30 (×2): 500 mg via ORAL
  Administered 2013-12-31: 11:00:00 via ORAL
  Administered 2014-01-01: 500 mg via ORAL
  Filled 2013-12-29 (×4): qty 1

## 2013-12-29 MED ORDER — BENZONATATE 100 MG PO CAPS
100.0000 mg | ORAL_CAPSULE | Freq: Three times a day (TID) | ORAL | Status: DC
  Administered 2013-12-29 – 2014-01-01 (×10): 100 mg via ORAL
  Filled 2013-12-29 (×12): qty 1

## 2013-12-29 MED ORDER — SPIRONOLACTONE 25 MG PO TABS
25.0000 mg | ORAL_TABLET | Freq: Every morning | ORAL | Status: DC
  Administered 2013-12-29 – 2014-01-01 (×4): 25 mg via ORAL
  Filled 2013-12-29 (×4): qty 1

## 2013-12-29 MED ORDER — IPRATROPIUM-ALBUTEROL 0.5-2.5 (3) MG/3ML IN SOLN
3.0000 mL | RESPIRATORY_TRACT | Status: DC
  Administered 2013-12-29 (×3): 3 mL via RESPIRATORY_TRACT
  Filled 2013-12-29 (×3): qty 3

## 2013-12-29 MED ORDER — ACETAMINOPHEN 325 MG PO TABS
650.0000 mg | ORAL_TABLET | Freq: Four times a day (QID) | ORAL | Status: DC | PRN
  Administered 2013-12-29 – 2014-01-01 (×5): 650 mg via ORAL
  Filled 2013-12-29 (×5): qty 2

## 2013-12-29 MED ORDER — INSULIN ASPART 100 UNIT/ML ~~LOC~~ SOLN
0.0000 [IU] | Freq: Every day | SUBCUTANEOUS | Status: DC
  Administered 2013-12-29: 5 [IU] via SUBCUTANEOUS
  Administered 2013-12-30: 3 [IU] via SUBCUTANEOUS
  Administered 2013-12-31: 4 [IU] via SUBCUTANEOUS

## 2013-12-29 MED ORDER — ADULT MULTIVITAMIN W/MINERALS CH
1.0000 | ORAL_TABLET | Freq: Every morning | ORAL | Status: DC
  Administered 2013-12-29 – 2014-01-01 (×4): 1 via ORAL
  Filled 2013-12-29 (×4): qty 1

## 2013-12-29 MED ORDER — BUDESONIDE 0.5 MG/2ML IN SUSP
0.5000 mg | Freq: Two times a day (BID) | RESPIRATORY_TRACT | Status: DC
  Administered 2013-12-29 – 2014-01-01 (×6): 0.5 mg via RESPIRATORY_TRACT
  Filled 2013-12-29 (×13): qty 2

## 2013-12-29 MED ORDER — ACETAMINOPHEN 325 MG PO TABS
650.0000 mg | ORAL_TABLET | Freq: Once | ORAL | Status: AC
  Administered 2013-12-29: 650 mg via ORAL
  Filled 2013-12-29: qty 2

## 2013-12-29 MED ORDER — SODIUM CHLORIDE 0.9 % IV BOLUS (SEPSIS)
1000.0000 mL | Freq: Once | INTRAVENOUS | Status: AC
  Administered 2013-12-29: 1000 mL via INTRAVENOUS

## 2013-12-29 MED ORDER — INSULIN ASPART 100 UNIT/ML ~~LOC~~ SOLN
0.0000 [IU] | Freq: Three times a day (TID) | SUBCUTANEOUS | Status: DC
  Administered 2013-12-29: 1 [IU] via SUBCUTANEOUS
  Administered 2013-12-29 (×2): 3 [IU] via SUBCUTANEOUS

## 2013-12-29 MED ORDER — ALBUTEROL SULFATE (2.5 MG/3ML) 0.083% IN NEBU: 2.5000 mg | INHALATION_SOLUTION | RESPIRATORY_TRACT | Status: DC | PRN

## 2013-12-29 MED ORDER — OXYCODONE HCL 5 MG PO TABS: 5.0000 mg | ORAL_TABLET | ORAL | Status: DC | PRN

## 2013-12-29 MED ORDER — METOPROLOL TARTRATE 1 MG/ML IV SOLN
2.5000 mg | Freq: Once | INTRAVENOUS | Status: AC
  Administered 2013-12-29: 2.5 mg via INTRAVENOUS
  Filled 2013-12-29: qty 5

## 2013-12-29 MED ORDER — GUAIFENESIN ER 600 MG PO TB12
1200.0000 mg | ORAL_TABLET | Freq: Two times a day (BID) | ORAL | Status: DC
  Administered 2013-12-29 – 2014-01-01 (×8): 1200 mg via ORAL
  Filled 2013-12-29 (×9): qty 2

## 2013-12-29 MED ORDER — VITAMIN B-12 1000 MCG PO TABS
1500.0000 ug | ORAL_TABLET | Freq: Every morning | ORAL | Status: DC
  Administered 2013-12-29 – 2014-01-01 (×4): 1500 ug via ORAL
  Filled 2013-12-29 (×4): qty 1

## 2013-12-29 MED ORDER — GLIMEPIRIDE 2 MG PO TABS
2.0000 mg | ORAL_TABLET | Freq: Every day | ORAL | Status: DC
  Administered 2013-12-29: 2 mg via ORAL
  Filled 2013-12-29 (×2): qty 1

## 2013-12-29 MED ORDER — IPRATROPIUM-ALBUTEROL 0.5-2.5 (3) MG/3ML IN SOLN
3.0000 mL | Freq: Four times a day (QID) | RESPIRATORY_TRACT | Status: DC
  Administered 2013-12-30 – 2014-01-01 (×10): 3 mL via RESPIRATORY_TRACT
  Filled 2013-12-29 (×11): qty 3

## 2013-12-29 MED ORDER — LEVOTHYROXINE SODIUM 125 MCG PO TABS
125.0000 ug | ORAL_TABLET | Freq: Every day | ORAL | Status: DC
  Administered 2013-12-29 – 2014-01-01 (×4): 125 ug via ORAL
  Filled 2013-12-29 (×5): qty 1

## 2013-12-29 MED ORDER — ACETAMINOPHEN 325 MG PO TABS: 650.0000 mg | ORAL_TABLET | Freq: Four times a day (QID) | ORAL | Status: DC | PRN

## 2013-12-29 MED ORDER — ONDANSETRON HCL 4 MG/2ML IJ SOLN
4.0000 mg | Freq: Four times a day (QID) | INTRAMUSCULAR | Status: DC | PRN
  Administered 2013-12-29 – 2014-01-01 (×2): 4 mg via INTRAVENOUS
  Filled 2013-12-29 (×2): qty 2

## 2013-12-29 MED ORDER — DEXTROSE 5 % IV SOLN
1.0000 g | Freq: Once | INTRAVENOUS | Status: AC
  Administered 2013-12-29: 1 g via INTRAVENOUS
  Filled 2013-12-29: qty 10

## 2013-12-29 MED ORDER — SODIUM CHLORIDE 0.9 % IV SOLN
INTRAVENOUS | Status: DC
  Administered 2013-12-29: 04:00:00 via INTRAVENOUS

## 2013-12-29 MED ORDER — ALBUTEROL SULFATE (2.5 MG/3ML) 0.083% IN NEBU
2.5000 mg | INHALATION_SOLUTION | Freq: Four times a day (QID) | RESPIRATORY_TRACT | Status: DC
  Administered 2013-12-29: 2.5 mg via RESPIRATORY_TRACT
  Filled 2013-12-29: qty 3

## 2013-12-29 MED ORDER — DEXTROSE 5 % IV SOLN
500.0000 mg | Freq: Once | INTRAVENOUS | Status: AC
  Administered 2013-12-29: 500 mg via INTRAVENOUS

## 2013-12-29 MED ORDER — SODIUM CHLORIDE 0.9 % IJ SOLN
3.0000 mL | Freq: Two times a day (BID) | INTRAMUSCULAR | Status: DC
  Administered 2013-12-29 – 2013-12-31 (×4): 3 mL via INTRAVENOUS

## 2013-12-29 MED ORDER — ONDANSETRON HCL 4 MG PO TABS
4.0000 mg | ORAL_TABLET | Freq: Four times a day (QID) | ORAL | Status: DC | PRN
  Administered 2013-12-30: 4 mg via ORAL
  Filled 2013-12-29: qty 1

## 2013-12-29 MED ORDER — LORAZEPAM 1 MG PO TABS
1.0000 mg | ORAL_TABLET | Freq: Four times a day (QID) | ORAL | Status: DC | PRN
  Administered 2013-12-29 – 2013-12-31 (×5): 1 mg via ORAL
  Filled 2013-12-29 (×5): qty 1

## 2013-12-29 MED ORDER — ALUM & MAG HYDROXIDE-SIMETH 200-200-20 MG/5ML PO SUSP: 30.0000 mL | Freq: Four times a day (QID) | ORAL | Status: DC | PRN

## 2013-12-29 MED ORDER — DEXTROSE 5 % IV SOLN: 500.0000 mg | INTRAVENOUS | Status: DC

## 2013-12-29 MED ORDER — ASPIRIN 325 MG PO TABS
325.0000 mg | ORAL_TABLET | Freq: Every morning | ORAL | Status: DC
  Administered 2013-12-29 – 2014-01-01 (×4): 325 mg via ORAL
  Filled 2013-12-29 (×4): qty 1

## 2013-12-29 MED ORDER — IOHEXOL 350 MG/ML SOLN
100.0000 mL | Freq: Once | INTRAVENOUS | Status: AC | PRN
  Administered 2013-12-29: 100 mL via INTRAVENOUS

## 2013-12-29 MED ORDER — PREDNISONE 50 MG PO TABS
60.0000 mg | ORAL_TABLET | Freq: Every day | ORAL | Status: DC
  Administered 2013-12-30 – 2013-12-31 (×2): 60 mg via ORAL
  Filled 2013-12-29 (×4): qty 1

## 2013-12-29 MED ORDER — ALBUTEROL SULFATE (2.5 MG/3ML) 0.083% IN NEBU: 2.5000 mg | INHALATION_SOLUTION | Freq: Four times a day (QID) | RESPIRATORY_TRACT | Status: DC | PRN

## 2013-12-29 MED ORDER — HYDROMORPHONE HCL PF 1 MG/ML IJ SOLN
0.5000 mg | INTRAMUSCULAR | Status: DC | PRN
  Administered 2013-12-29: 1 mg via INTRAVENOUS
  Filled 2013-12-29: qty 1

## 2013-12-29 NOTE — ED Notes (Signed)
EKG given to EDP, Otter,MD. For review.

## 2013-12-29 NOTE — Progress Notes (Addendum)
[facility] PROGRESS NOTE  REVECA DESMARAIS JJK:[id number] DOB: Jun 26, 1956 DOA: 12/28/2013 PCP: [name], NP  Assessment/Plan  CAP, no evidence of pneumonia on CXR or CT chest -  continue IV Rocephin and IV Azithromycin -  UA negative  Acute COPD exacerbation  -  Start solumedrol 125mg  IV once -  Prednisone 60mg  BID starting today and taper as tolerated -  Change to duonebs q4h  -  Add budesonide -  Start tessalon  -  Okay to d/c continuous pulse ox   Lung Cancer- Stage III, Recurrence x3, currently on Chemotherapy,  -  Dr. [name] added to rounding list   Sinus Tachycardia, likely related to pneumonia/COPD and bronchodilatory -  F/u TSH -  CT a chest neg for PE   Lactic Acidosis -  D/c IVF -  Continue to hold metformin  DM2 - d/c Glimepiride - f/u HbA1C.   Hyperlipidemia, stable, continue Krill Oil   Normocytic anemia with decreasing hemoglobin -  Occult stool -  Iron studies -  B12, folate, TSH   Diet:  diabetic Access:  PIV IVF:  off Proph:  lovenox  Code Status: full Family Communication: patient alone Disposition Plan: pending improvement in SOB, cough, tachycardia, blood cultures x 24 hours   Consultants:  Dr. [name], Onc, added to rounding list  Procedures:  CXR  CT angio chest  Antibiotics:  Ceftriaxone 3/14  Azithromycin 3/14   HPI/Subjective:  Breathing treatments helping.  Had headache and receive dilaudid and now feeling dizzy and nauseated.    Objective: Filed Vitals:   12/29/13 0123 12/29/13 0343 12/29/13 0912 12/29/13 1057  BP: 121/47 104/68  122/77  Pulse: 131 109  108  Temp: 99.1 F (37.3 C) 98.8 F (37.1 C)  97.9 F (36.6 C)  TempSrc: Oral Oral  Axillary  Resp: 18 20  18   Height:  5\' 8"  (1.727 m)    Weight:  87.5 kg (192 lb 14.4 oz)    SpO2: 99% 97% 95% 96%    Intake/Output Summary (Last 24 hours) at 12/29/13 1138 Last data filed at 12/29/13 0500  Gross per 24 hour  Intake  93.75 ml  Output    400 ml   Net -306.25 ml   Filed Weights   12/28/13 2201 12/29/13 0343  Weight: 84.823 kg (187 lb) 87.5 kg (192 lb 14.4 oz)    Exam:   General:  CF, No acute distress  HEENT:  NCAT, MMM  Cardiovascular:  Tachycardic, RR, nl S1, S2 no mrg, 2+ pulses, warm extremities  Respiratory:  Diminished bilateral BS with wheezes, no focal rales or rhonchi, no increased WOB  Abdomen:   NABS, soft, NT/ND  MSK:   Normal tone and bulk, no LEE  Neuro:  Grossly intact  Data Reviewed: Basic Metabolic Panel:  Recent Labs Lab 12/26/13 1003 12/28/13 2240 12/29/13 0507  NA 137 136* 138  K 4.1 4.2 3.9  CL  --  96 102  CO2 24 22 23   GLUCOSE 187* 141* 212*  BUN 6.8* 7 6  CREATININE 0.7 0.63 0.68  CALCIUM 8.6 8.7 7.9*   Liver Function Tests:  Recent Labs Lab 12/26/13 1003 12/28/13 2240  AST 29 48*  ALT 39 49*  ALKPHOS 73 71  BILITOT 0.21 0.4  PROT 6.7 7.1  ALBUMIN 3.4* 3.5   No results found for this basename: LIPASE, AMYLASE,  in the last 168 hours No results found for this basename: AMMONIA,  in the last 168 hours CBC:  Recent Labs Lab  12/26/13 1003 12/28/13 2240 12/29/13 0507  WBC 3.7* 6.7 5.5  NEUTROABS 2.1 5.9  --   HGB 11.3* 11.1* 9.4*  HCT 34.3* 32.1* 28.5*  MCV 86.4 85.8 87.7  PLT 238 161 148*   Cardiac Enzymes: No results found for this basename: CKTOTAL, CKMB, CKMBINDEX, TROPONINI,  in the last 168 hours BNP (last 3 results) No results found for this basename: PROBNP,  in the last 8760 hours CBG: No results found for this basename: GLUCAP,  in the last 168 hours  No results found for this or any previous visit (from the past 240 hour(s)).   Studies: Ct Angio Chest Pe W/cm &/or Wo Cm  12/29/2013   CLINICAL DATA:  Janine Reller of breath and tachycardia.  Lung cancer.  EXAM: CT ANGIOGRAPHY CHEST WITH CONTRAST  TECHNIQUE: Multidetector CT imaging of the chest was performed using the standard protocol during bolus administration of intravenous contrast. Multiplanar CT  image reconstructions and MIPs were obtained to evaluate the vascular anatomy.  CONTRAST:  139mL OMNIPAQUE IOHEXOL 350 MG/ML SOLN  COMPARISON:  CT chest 10/05/2013  FINDINGS: Negative for pulmonary embolism. Negative for aortic aneurysm or dissection. Atherosclerotic calcification in the aortic arch. Mild pericardial thickening anteriorly may be due to radiation treatment. No pericardial effusion. Heart size is normal.  Right hilar soft tissue thickening compatible with treated tumor and radiation change. There is slight increase in soft tissue thickening in the right upper lobe posterior to the hilum. This may be due to radiation change or tumor growth. Continued close followup is warranted. Right peritracheal lymph node measures 11 mm in diameter and has some calcification. This appears slightly smaller in size compared with the prior study.  Negative for pneumonia.  Negative for effusion.  Review of the MIP images confirms the above findings.  IMPRESSION: Negative for pulmonary embolism.  Right hilar and right upper lobe tumor with radiation change. There is increased soft tissue density extending into the posterior apical segment of the right upper lobe compared with the prior study which could be due to progressive radiation change versus tumor growth. Close followup is warranted.   Electronically Signed   By: Franchot Gallo M.D.   On: 12/29/2013 01:36   Dg Chest Port 1 View  12/28/2013   CLINICAL DATA:  Cough, fever and chest congestion. Treated for lung cancer. Ex-smoker.  EXAM: PORTABLE CHEST - 1 VIEW  COMPARISON:  Previous examinations, including the PET-CT dated 10/24/2013.  FINDINGS: Stable mass like area in the medial aspect of the right upper lobe with previously demonstrated postradiation changes at that location. The remainder of the lungs are clear and mildly hyperexpanded. Stable right jugular port catheter. Normal sized heart. Mild scoliosis.  IMPRESSION: No acute abnormality. COPD and treated  right upper lobe lung cancer.   Electronically Signed   By: Enrique Sack M.D.   On: 12/28/2013 23:59    Scheduled Meds: . aspirin  325 mg Oral q morning - 10a  . [START ON 12/30/2013] azithromycin  500 mg Oral Daily  . budesonide (PULMICORT) nebulizer solution  0.5 mg Nebulization BID  . [START ON 12/30/2013] cefTRIAXone (ROCEPHIN)  IV  1 g Intravenous Q24H  . cholecalciferol  1,000 Units Oral q morning - 10a  . cyanocobalamin  1,500 mcg Oral q morning - 10a  . enoxaparin (LOVENOX) injection  40 mg Subcutaneous Q24H  . glimepiride  2 mg Oral QAC breakfast  . guaiFENesin  1,200 mg Oral BID  . insulin aspart  0-5 Units Subcutaneous  QHS  . insulin aspart  0-9 Units Subcutaneous TID WC  . ipratropium-albuterol  3 mL Nebulization Q4H  . levothyroxine  125 mcg Oral QAC breakfast  . magnesium gluconate  500 mg Oral q morning - 10a  . methylPREDNISolone (SOLU-MEDROL) injection  125 mg Intravenous Once  . multivitamin with minerals  1 tablet Oral q morning - 10a  . [START ON 12/30/2013] predniSONE  60 mg Oral Q breakfast  . sertraline  150 mg Oral QHS  . sodium chloride  3 mL Intravenous Q12H  . spironolactone  25 mg Oral q morning - 10a   Continuous Infusions:   Principal Problem:   CAP (community acquired pneumonia) Active Problems:   CARCINOMA, LUNG, SQUAMOUS CELL   HYPOTHYROIDISM   DIABETES MELLITUS, TYPE II   HYPERLIPIDEMIA   COPD UNSPECIFIED   Sinus tachycardia   Lactic acidosis    Time spent: 30 min    Burlene Montecalvo, [place]  [facility] Pager [phone]. If 7PM-7AM, please contact night-coverage at [email or web address], password [facility] 12/29/2013, 11:38 AM  LOS: 1 day

## 2013-12-29 NOTE — Progress Notes (Signed)
Patient requests she not be awakened in the middle of the night for BD therapy. She is aware that she may call at any time she feels increased SOB. She likes that she is receiving nebs at an increased frequency than prior, but wishes to remain Q 4 during the awake hours only. RT treatment protocol assessment completed. Orders changed accordingly.

## 2013-12-29 NOTE — H&P (Signed)
[facility] History and Physical  Janice Ball ZOX:[id number] DOB: 21-Aug-1956 DOA: 12/28/2013  Referring physician:  EDP PCP: [name], NP  Specialists:   Chief Complaint:    Fevers Chills and Cough  HPI: Janice Ball is a 58 y.o. female with a history of Squamous Cell Lung Cancer originally diagnosed in 05/2009 with history of radiation treatment and chemotherapy  Now with 3rd recurrence and currently receiving Chemotherapy, and her last chemotherapy was on 03/11.   She presents to the ED with complaints of fevers, chills, cough and SOB which began at 6pm tonight. She came into the ED with concerns that she may have Neutropenic Fever.   She was evaluated in the ED and was not found to be neutropenic, and was diagnosed clinically with CAP pneumonia and placed on IV Rocephin and Azithromycin and referred for admission.      Review of Systems:  Constitutional: No Weight Loss, No Weight Gain, Night Sweats, +Fevers, +Chills, Fatigue, or Generalized Weakness HEENT: No Headaches, Difficulty Swallowing,Tooth/Dental Problems,Sore Throat,  No Sneezing, Rhinitis, Ear Ache, Nasal Congestion, or Post Nasal Drip,  Cardio-vascular:  No Chest pain, Orthopnea, PND, Edema in lower extremities, Anasarca, Dizziness, Palpitations  Resp:  +Dyspnea, No DOE, +Productive Cough, No Hemoptysis, No Change in Color of Mucus,  +Wheezing.    GI: No Heartburn, Indigestion, Abdominal Pain, Nausea, Vomiting, Diarrhea, Change in Bowel Habits,  Loss of Appetite  GU: No Dysuria, Change in Color of Urine, No Urgency or Frequency.  No flank pain.  Musculoskeletal: No Joint Pain or Swelling.  No Decreased Range of Motion. No Back Pain.  Neurologic: No Syncope, No Seizures, Muscle Weakness, Paresthesia, Vision Disturbance or Loss, No Diplopia, No Vertigo, No Difficulty Walking,  Skin: No Rash or Lesions. Psych: No Change in Mood or Affect. No Depression or Anxiety. No Memory loss. No Confusion or  Hallucinations   Past Medical History  Diagnosis Date  . Anxiety   . Hyperlipidemia   . Hypothyroidism   . COPD (chronic obstructive pulmonary disease)   . Arthritis     thumbs  . 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 Lung cancer     lung ca dx 8/10  . Lung cancer   . Diabetes mellitus       Past Surgical History  Procedure Laterality Date  . Total abdominal hysterectomy    . Thyroidectomy, partial    . Tonsillectomy    . Tubal ligation         Prior to Admission medications   Medication Sig Start Date End Date Taking? Authorizing Provider  acetaminophen (TYLENOL) 325 MG tablet Take 650 mg by mouth every 6 (six) hours as needed for mild pain or headache.    Yes Historical Provider, MD  albuterol (PROVENTIL HFA;VENTOLIN HFA) 108 (90 BASE) MCG/ACT inhaler Inhale 2 puffs into the lungs every 6 (six) hours as needed for wheezing or shortness of breath.   Yes Historical Provider, MD  aspirin 325 MG tablet Take 325 mg by mouth every morning.    Yes Historical Provider, MD  budesonide-formoterol (SYMBICORT) 160-4.5 MCG/ACT inhaler Inhale 2 puffs into the lungs 2 (two) times daily.   Yes Historical Provider, MD  cholecalciferol (VITAMIN D) 1000 UNITS tablet Take 1,000 Units by mouth every morning.    Yes Historical Provider, MD  Chromium 1000 MCG TABS Take 1 tablet by mouth every morning.    Yes Historical Provider, MD  cyanocobalamin 1000 MCG tablet Take 1,500 mcg by mouth every  morning.   Yes Historical Provider, MD  glimepiride (AMARYL) 2 MG tablet Take 2 mg by mouth daily before breakfast.    Yes Historical Provider, MD  guaiFENesin (MUCINEX) 600 MG 12 hr tablet Take 1,200 mg by mouth 2 (two) times daily.   Yes Historical Provider, MD  KRILL OIL PO Take 1 tablet by mouth every morning.    Yes Historical Provider, MD  levothyroxine (SYNTHROID, LEVOTHROID) 125 MCG tablet Take 125 mcg by mouth daily before breakfast.    Yes Historical Provider, MD   lidocaine-prilocaine (EMLA) cream Apply 1 application topically as needed (skin numbing).   Yes Historical Provider, MD  LORazepam (ATIVAN) 1 MG tablet Take 1 mg by mouth 4 (four) times daily as needed for anxiety.   Yes Historical Provider, MD  magnesium gluconate (MAGONATE) 500 MG tablet Take 500 mg by mouth every morning.    Yes Historical Provider, MD  metFORMIN (GLUCOPHAGE) 1000 MG tablet Take 1,000 mg by mouth 2 (two) times daily with a meal.   Yes Historical Provider, MD  Multiple Vitamin (MULTIVITAMIN WITH MINERALS) TABS tablet Take 1 tablet by mouth every morning.   Yes Historical Provider, MD  prochlorperazine (COMPAZINE) 10 MG tablet Take 10 mg by mouth every 6 (six) hours as needed for nausea or vomiting.   Yes Historical Provider, MD  sertraline (ZOLOFT) 100 MG tablet Take 150 mg by mouth at bedtime. Takes 1 1/2 tabs daily for total dose of 150 mg   Yes Historical Provider, MD  spironolactone (ALDACTONE) 25 MG tablet Take 25 mg by mouth every morning.    Yes Historical Provider, MD      Allergies  Allergen Reactions  . Sulfonamide Derivatives Rash  . Codeine Other (See Comments)    hallucinations  . Penicillins Other (See Comments)    Childhood allergy; reaction unknown     Social History:  reports that she quit smoking about 9 months ago. Her smoking use included Cigarettes. She has a 45 pack-year smoking history. She has never used smokeless tobacco. She reports that she does not drink alcohol or use illicit drugs.     Family History  Problem Relation Age of Onset  . Colon cancer Mother   . Cancer Mother     colon       Physical Exam:  GEN:  Pleasant Obese Elderly appearing   58 y.o.  Caucasian female  examined  and in no acute distress; cooperative with exam Filed Vitals:   12/28/13 2201 12/28/13 2321 12/29/13 0123  BP: 133/75  121/47  Pulse: 135  131  Temp: 99.9 F (37.7 C)  99.1 F (37.3 C)  TempSrc:   Oral  Resp: 20  18  Height: 5\' 8"  (1.727 m)     Weight: 84.823 kg (187 lb)    SpO2: 96% 95% 99%   Blood pressure 121/47, pulse 131, temperature 99.1 F (37.3 C), temperature source Oral, resp. rate 18, height 5\' 8"  (1.727 m), weight 84.823 kg (187 lb), SpO2 99.00%. PSYCH: She is alert and oriented x4; does not appear anxious does not appear depressed; affect is normal HEENT: Normocephalic and Atraumatic, Mucous membranes pink; PERRLA; EOM intact; Fundi:  Benign;  No scleral icterus, Nares: Patent, Oropharynx: Clear, Edentulous with Dentures present,  Neck:  FROM, no cervical lymphadenopathy nor thyromegaly or carotid bruit; no JVD; Breasts:: Not examined CHEST WALL: No tenderness CHEST: Normal respiration, clear to auscultation bilaterally HEART: Tachycardic Rate and Regular Rhythm; no murmurs rubs or gallops BACK: No kyphosis or  scoliosis; no CVA tenderness ABDOMEN: Positive Bowel Sounds, Obese, soft non-tender; no masses, no organomegaly Rectal Exam: Not done EXTREMITIES: No cyanosis, clubbing or edema; no ulcerations. Genitalia: not examined PULSES: 2+ and symmetric SKIN: Normal hydration no rash or ulceration YPP:JKDTO and Oriented x 4, No Focal Deficits.   Vascular: pulses palpable throughout    Labs on Admission:  Basic Metabolic Panel:  Recent Labs Lab 12/26/13 1003 12/28/13 2240  NA 137 136*  K 4.1 4.2  CL  --  96  CO2 24 22  GLUCOSE 187* 141*  BUN 6.8* 7  CREATININE 0.7 0.63  CALCIUM 8.6 8.7   Liver Function Tests:  Recent Labs Lab 12/26/13 1003 12/28/13 2240  AST 29 48*  ALT 39 49*  ALKPHOS 73 71  BILITOT 0.21 0.4  PROT 6.7 7.1  ALBUMIN 3.4* 3.5   No results found for this basename: LIPASE, AMYLASE,  in the last 168 hours No results found for this basename: AMMONIA,  in the last 168 hours CBC:  Recent Labs Lab 12/26/13 1003 12/28/13 2240  WBC 3.7* 6.7  NEUTROABS 2.1 5.9  HGB 11.3* 11.1*  HCT 34.3* 32.1*  MCV 86.4 85.8  PLT 238 161   Cardiac Enzymes: No results found for this basename:  CKTOTAL, CKMB, CKMBINDEX, TROPONINI,  in the last 168 hours  BNP (last 3 results) No results found for this basename: PROBNP,  in the last 8760 hours CBG: No results found for this basename: GLUCAP,  in the last 168 hours  Radiological Exams on Admission: Ct Angio Chest Pe W/cm &/or Wo Cm  12/29/2013   CLINICAL DATA:  Short of breath and tachycardia.  Lung cancer.  EXAM: CT ANGIOGRAPHY CHEST WITH CONTRAST  TECHNIQUE: Multidetector CT imaging of the chest was performed using the standard protocol during bolus administration of intravenous contrast. Multiplanar CT image reconstructions and MIPs were obtained to evaluate the vascular anatomy.  CONTRAST:  148mL OMNIPAQUE IOHEXOL 350 MG/ML SOLN  COMPARISON:  CT chest 10/05/2013  FINDINGS: Negative for pulmonary embolism. Negative for aortic aneurysm or dissection. Atherosclerotic calcification in the aortic arch. Mild pericardial thickening anteriorly may be due to radiation treatment. No pericardial effusion. Heart size is normal.  Right hilar soft tissue thickening compatible with treated tumor and radiation change. There is slight increase in soft tissue thickening in the right upper lobe posterior to the hilum. This may be due to radiation change or tumor growth. Continued close followup is warranted. Right peritracheal lymph node measures 11 mm in diameter and has some calcification. This appears slightly smaller in size compared with the prior study.  Negative for pneumonia.  Negative for effusion.  Review of the MIP images confirms the above findings.  IMPRESSION: Negative for pulmonary embolism.  Right hilar and right upper lobe tumor with radiation change. There is increased soft tissue density extending into the posterior apical segment of the right upper lobe compared with the prior study which could be due to progressive radiation change versus tumor growth. Close followup is warranted.   Electronically Signed   By: Franchot Gallo M.D.   On:  12/29/2013 01:36   Dg Chest Port 1 View  12/28/2013   CLINICAL DATA:  Cough, fever and chest congestion. Treated for lung cancer. Ex-smoker.  EXAM: PORTABLE CHEST - 1 VIEW  COMPARISON:  Previous examinations, including the PET-CT dated 10/24/2013.  FINDINGS: Stable mass like area in the medial aspect of the right upper lobe with previously demonstrated postradiation changes at that location. The  remainder of the lungs are clear and mildly hyperexpanded. Stable right jugular port catheter. Normal sized heart. Mild scoliosis.  IMPRESSION: No acute abnormality. COPD and treated right upper lobe lung cancer.   Electronically Signed   By: Enrique Sack M.D.   On: 12/28/2013 23:59      EKG: Independently reviewed. Sinus Tachycardaia Rate 121.      Assessment/Plan:   58 y.o. female with  Principal Problem:   CAP (community acquired pneumonia) Active Problems:   CARCINOMA, LUNG, SQUAMOUS CELL   Sinus tachycardia   Lactic acidosis   COPD UNSPECIFIED   HYPOTHYROIDISM   DIABETES MELLITUS, TYPE II   HYPERLIPIDEMIA     1.  CAP- IV Rocephin and IV Azithromycin, Albuterol Nebs, O2 PRN Monitor O2 Sats.    2.   Lung Cancer-   Stage III, Recurrence x3, currently on Chemotherapy,   Notify Oncology of Admission.     3.   Sinus Tachycardia- Check TSH,  Add IV Lopressor BP as BP tolerates.     4.   Lactic Acidosis-   Due to Chemo Rx and Metformin Rx,  D/C Metformin, IVFs for hydration.     5.   DM2-  Continue Glimepiride,  And Add SSI PRN and check HbA1C.    6.   COPD-  Albuterol Nebs PRN.     7.   Hyperlipidemia-  On Krill Oil    8.  DVT prophylaxis with Lovenox.         Code Status:  FULL CODE  Family Communication:    No Family Present Disposition Plan:       Inpatient  Time spent:  [place] [facility] Pager [phone]  If 7PM-7AM, please contact night-coverage [email or web address] Password [email or web address] 12/29/2013, 3:11 AM

## 2013-12-30 DIAGNOSIS — J441 Chronic obstructive pulmonary disease with (acute) exacerbation: Secondary | ICD-10-CM

## 2013-12-30 LAB — FOLATE RBC: RBC Folate: 963 ng/mL — ABNORMAL HIGH (ref >280)

## 2013-12-30 LAB — URINE CULTURE
Colony Count: NO GROWTH
Culture: NO GROWTH

## 2013-12-30 LAB — T4, FREE: Free T4: 0.93 ng/dL (ref 0.80–1.80)

## 2013-12-30 LAB — BASIC METABOLIC PANEL
BUN: 9 mg/dL (ref 6–23)
CALCIUM: 8.3 mg/dL — AB (ref 8.4–10.5)
CO2: 23 meq/L (ref 19–32)
CREATININE: 0.54 mg/dL (ref 0.50–1.10)
Chloride: 102 mEq/L (ref 96–112)
GFR calc Af Amer: >90 mL/min (ref >90)
Glucose, Bld: 160 mg/dL — ABNORMAL HIGH (ref 70–99)
Potassium: 4.1 mEq/L (ref 3.7–5.3)
Sodium: 140 mEq/L (ref 137–147)

## 2013-12-30 LAB — CBC
HCT: 29.2 % — ABNORMAL LOW (ref 36.0–46.0)
Hemoglobin: 9.9 g/dL — ABNORMAL LOW (ref 12.0–15.0)
MCH: 29.2 pg (ref 26.0–34.0)
MCHC: 33.9 g/dL (ref 30.0–36.0)
MCV: 86.1 fL (ref 78.0–100.0)
Platelets: 155 10*3/uL (ref 150–400)
RBC: 3.39 MIL/uL — ABNORMAL LOW (ref 3.87–5.11)
RDW: 17.7 % — AB (ref 11.5–15.5)
WBC: 7 10*3/uL (ref 4.0–10.5)

## 2013-12-30 LAB — GLUCOSE, CAPILLARY
GLUCOSE-CAPILLARY: 263 mg/dL — AB (ref 70–99)
Glucose-Capillary: 209 mg/dL — ABNORMAL HIGH (ref 70–99)
Glucose-Capillary: 201 mg/dL — ABNORMAL HIGH (ref 70–99)
Glucose-Capillary: 234 mg/dL — ABNORMAL HIGH (ref 70–99)

## 2013-12-30 LAB — IRON AND TIBC
Iron: 56 ug/dL (ref 42–135)
Saturation Ratios: 18 % — ABNORMAL LOW (ref 20–55)
TIBC: 317 ug/dL (ref 250–470)
UIBC: 261 ug/dL (ref 125–400)

## 2013-12-30 LAB — T3, FREE: T3 FREE: 1.7 pg/mL — AB (ref 2.3–4.2)

## 2013-12-30 LAB — FERRITIN: FERRITIN: 204 ng/mL (ref 10–291)

## 2013-12-30 LAB — VITAMIN B12: Vitamin B-12: 820 pg/mL (ref 211–911)

## 2013-12-30 LAB — TSH: TSH: 3.617 u[IU]/mL (ref 0.350–4.500)

## 2013-12-30 LAB — TRANSFERRIN: Transferrin: 231 mg/dL (ref 200–360)

## 2013-12-30 MED ORDER — INSULIN ASPART 100 UNIT/ML ~~LOC~~ SOLN
0.0000 [IU] | Freq: Three times a day (TID) | SUBCUTANEOUS | Status: DC
  Administered 2013-12-30 (×2): 5 [IU] via SUBCUTANEOUS
  Administered 2013-12-30: 8 [IU] via SUBCUTANEOUS

## 2013-12-30 MED ORDER — INSULIN DETEMIR 100 UNIT/ML ~~LOC~~ SOLN
10.0000 [IU] | Freq: Every day | SUBCUTANEOUS | Status: DC
  Administered 2013-12-30 – 2013-12-31 (×2): 10 [IU] via SUBCUTANEOUS
  Filled 2013-12-30 (×3): qty 0.1

## 2013-12-30 MED ORDER — ARFORMOTEROL TARTRATE 15 MCG/2ML IN NEBU
15.0000 ug | INHALATION_SOLUTION | Freq: Two times a day (BID) | RESPIRATORY_TRACT | Status: DC
  Administered 2013-12-30 – 2014-01-01 (×4): 15 ug via RESPIRATORY_TRACT
  Filled 2013-12-30 (×7): qty 2

## 2013-12-30 NOTE — Progress Notes (Addendum)
[facility] PROGRESS NOTE  Janice Ball XLK:[id number] DOB: Oct 28, 1955 DOA: 12/28/2013 PCP: Janice Chimes, NP  Assessment/Plan  CAP, no evidence of pneumonia on CXR or CT chest, temperature trending down -  continue IV Rocephin and Azithromycin, day 2 -  UA negative -  BCx NGTD  Acute COPD exacerbation, improving, but still SOB with talking and getting up to use bathroom -  Continue prednisone 60mg  BID today and taper as tolerated -  Change to duonebs q4h  -  Continue budesonide -  Add arformoterol -  Continue tessalon  -  walking pulse ox   Lung Cancer- Stage III, Recurrence x3, currently on Chemotherapy,  -  Dr. [name] added to rounding list   Sinus Tachycardia, likely related to pneumonia/COPD and bronchodilators -  TSH 7.92 -  Check fT3 and fT4 -  CT a chest neg for PE   Lactic Acidosis -  D/c IVF -  Continue to hold metformin  DM2, CBG rising likely secondary to steroids - hold oral meds -  Increase to mod dose SSI and add levemir 10 units -  Consider increasing to high dose SSI if needed - HbA1C. 7.3  Hyperlipidemia, stable, continue Krill Oil   Abnormal TSH, likely sick euthyroid -  Add on free T3, free T4  Normocytic anemia with hemoglobin now stable around 9mg /dl -  Occult stool -  Iron studies pending -  B12, folate pending   Diet:  diabetic Access:  PIV IVF:  off Proph:  lovenox  Code Status: full Family Communication: patient alone Disposition Plan: pending improvement in SOB, cough, tachycardia   Consultants:  Dr. [name], Onc, added to rounding list  Procedures:  CXR  CT angio chest  Antibiotics:  Ceftriaxone 3/14  Azithromycin 3/14   HPI/Subjective:  Breathing treatments helping.  SOB when talking and gets very winded going to bathroom.  Very fatigued   Objective: Filed Vitals:   12/29/13 2146 12/30/13 0300 12/30/13 0648 12/30/13 0933  BP: 92/70 110/70 122/69 109/58  Pulse: 121 105 65 115  Temp: 97.5 F (36.4  C) 97.6 F (36.4 C) 97.7 F (36.5 C) 98.3 F (36.8 C)  TempSrc: Oral Oral Oral Oral  Resp: 18 18 18 22   Height:      Weight:      SpO2: 90% 91% 100% 96%    Intake/Output Summary (Last 24 hours) at 12/30/13 1208 Last data filed at 12/30/13 0915  Gross per 24 hour  Intake    840 ml  Output      0 ml  Net    840 ml   Filed Weights   12/28/13 2201 12/29/13 0343  Weight: 84.823 kg (187 lb) 87.5 kg (192 lb 14.4 oz)    Exam:   General:  CF, no acute distress, gets mildly winded when speaking, but able to complete full sentences  HEENT:  NCAT, MMM  Cardiovascular:  Tachycardic, RR, nl S1, S2 no mrg, 2+ pulses, warm extremities  Respiratory:  Improved bilateral BS, but still diminished at bases, end-exp wheez, no focal rales or rhonchi, no increased WOB  Abdomen:   NABS, soft, NT/ND  MSK:   Normal tone and bulk, no LEE  Neuro:  Grossly intact  Data Reviewed: Basic Metabolic Panel:  Recent Labs Lab 12/26/13 1003 12/28/13 2240 12/29/13 0507 12/30/13 0525  NA 137 136* 138 140  K 4.1 4.2 3.9 4.1  CL  --  96 102 102  CO2 24 22 23 23   GLUCOSE 187* 141* 212* 160*  BUN 6.8* 7 6 9   CREATININE 0.7 0.63 0.68 0.54  CALCIUM 8.6 8.7 7.9* 8.3*   Liver Function Tests:  Recent Labs Lab 12/26/13 1003 12/28/13 2240  AST 29 48*  ALT 39 49*  ALKPHOS 73 71  BILITOT 0.21 0.4  PROT 6.7 7.1  ALBUMIN 3.4* 3.5   No results found for this basename: LIPASE, AMYLASE,  in the last 168 hours No results found for this basename: AMMONIA,  in the last 168 hours CBC:  Recent Labs Lab 12/26/13 1003 12/28/13 2240 12/29/13 0507 12/30/13 0525  WBC 3.7* 6.7 5.5 7.0  NEUTROABS 2.1 5.9  --   --   HGB 11.3* 11.1* 9.4* 9.9*  HCT 34.3* 32.1* 28.5* 29.2*  MCV 86.4 85.8 87.7 86.1  PLT 238 161 148* 155   Cardiac Enzymes: No results found for this basename: CKTOTAL, CKMB, CKMBINDEX, TROPONINI,  in the last 168 hours BNP (last 3 results) No results found for this basename: PROBNP,  in  the last 8760 hours CBG:  Recent Labs Lab 12/29/13 2145 12/30/13 0736  GLUCAP 396* 209*    No results found for this or any previous visit (from the past 240 hour(s)).   Studies: Ct Angio Chest Pe W/cm &/or Wo Cm  12/29/2013   CLINICAL DATA:  Janice Ball of breath and tachycardia.  Lung cancer.  EXAM: CT ANGIOGRAPHY CHEST WITH CONTRAST  TECHNIQUE: Multidetector CT imaging of the chest was performed using the standard protocol during bolus administration of intravenous contrast. Multiplanar CT image reconstructions and MIPs were obtained to evaluate the vascular anatomy.  CONTRAST:  165mL OMNIPAQUE IOHEXOL 350 MG/ML SOLN  COMPARISON:  CT chest 10/05/2013  FINDINGS: Negative for pulmonary embolism. Negative for aortic aneurysm or dissection. Atherosclerotic calcification in the aortic arch. Mild pericardial thickening anteriorly may be due to radiation treatment. No pericardial effusion. Heart size is normal.  Right hilar soft tissue thickening compatible with treated tumor and radiation change. There is slight increase in soft tissue thickening in the right upper lobe posterior to the hilum. This may be due to radiation change or tumor growth. Continued close followup is warranted. Right peritracheal lymph node measures 11 mm in diameter and has some calcification. This appears slightly smaller in size compared with the prior study.  Negative for pneumonia.  Negative for effusion.  Review of the MIP images confirms the above findings.  IMPRESSION: Negative for pulmonary embolism.  Right hilar and right upper lobe tumor with radiation change. There is increased soft tissue density extending into the posterior apical segment of the right upper lobe compared with the prior study which could be due to progressive radiation change versus tumor growth. Close followup is warranted.   Electronically Signed   By: Franchot Gallo M.D.   On: 12/29/2013 01:36   Dg Chest Port 1 View  12/28/2013   CLINICAL DATA:  Cough,  fever and chest congestion. Treated for lung cancer. Ex-smoker.  EXAM: PORTABLE CHEST - 1 VIEW  COMPARISON:  Previous examinations, including the PET-CT dated 10/24/2013.  FINDINGS: Stable mass like area in the medial aspect of the right upper lobe with previously demonstrated postradiation changes at that location. The remainder of the lungs are clear and mildly hyperexpanded. Stable right jugular port catheter. Normal sized heart. Mild scoliosis.  IMPRESSION: No acute abnormality. COPD and treated right upper lobe lung cancer.   Electronically Signed   By: Enrique Sack M.D.   On: 12/28/2013 23:59    Scheduled Meds: . aspirin  325  mg Oral q morning - 10a  . azithromycin  500 mg Oral Daily  . benzonatate  100 mg Oral TID  . budesonide (PULMICORT) nebulizer solution  0.5 mg Nebulization BID  . cefTRIAXone (ROCEPHIN)  IV  1 g Intravenous Q24H  . cholecalciferol  1,000 Units Oral q morning - 10a  . cyanocobalamin  1,500 mcg Oral q morning - 10a  . enoxaparin (LOVENOX) injection  40 mg Subcutaneous Q24H  . guaiFENesin  1,200 mg Oral BID  . insulin aspart  0-15 Units Subcutaneous TID WC  . insulin aspart  0-5 Units Subcutaneous QHS  . insulin detemir  10 Units Subcutaneous QHS  . ipratropium-albuterol  3 mL Nebulization QID  . levothyroxine  125 mcg Oral QAC breakfast  . magnesium gluconate  500 mg Oral q morning - 10a  . multivitamin with minerals  1 tablet Oral q morning - 10a  . predniSONE  60 mg Oral Q breakfast  . sertraline  150 mg Oral QHS  . sodium chloride  3 mL Intravenous Q12H  . spironolactone  25 mg Oral q morning - 10a   Continuous Infusions:   Principal Problem:   CAP (community acquired pneumonia) Active Problems:   CARCINOMA, LUNG, SQUAMOUS CELL   HYPOTHYROIDISM   DIABETES MELLITUS, TYPE II   HYPERLIPIDEMIA   COPD UNSPECIFIED   Sinus tachycardia   Lactic acidosis    Time spent: 30 min    Aidric Endicott, [place]  [facility] Pager [phone]. If 7PM-7AM, please  contact night-coverage at [email or web address], password [facility] 12/30/2013, 12:08 PM  LOS: 2 days

## 2013-12-30 NOTE — ED Provider Notes (Signed)
Medical screening examination/treatment/procedure(s) were conducted as a shared visit with non-physician practitioner(s) and myself.  I personally evaluated the patient during the encounter.   EKG Interpretation None     58 year old female with cough and fever. History of lung cancer COPD. On exam she is diffusely wheezy with right-sided rhonchi. Unsure if her right-sided breath sounds are secondary to infectious process, known malignancy or from prior radiation. Chest x-ray is clear. Will CT to evaluate for possible: Pneumonia as well as pulmonary embolism given her history of malignancy. Nebs. Antibiotics. Admit.   Virgel Manifold, MD 12/30/13 2009

## 2013-12-31 LAB — BASIC METABOLIC PANEL
BUN: 13 mg/dL (ref 6–23)
CO2: 28 mEq/L (ref 19–32)
Calcium: 8.1 mg/dL — ABNORMAL LOW (ref 8.4–10.5)
Chloride: 104 mEq/L (ref 96–112)
Creatinine, Ser: 0.64 mg/dL (ref 0.50–1.10)
GFR calc Af Amer: >90 mL/min (ref >90)
Glucose, Bld: 137 mg/dL — ABNORMAL HIGH (ref 70–99)
Potassium: 3.9 mEq/L (ref 3.7–5.3)
SODIUM: 141 meq/L (ref 137–147)

## 2013-12-31 LAB — GLUCOSE, CAPILLARY
GLUCOSE-CAPILLARY: 210 mg/dL — AB (ref 70–99)
GLUCOSE-CAPILLARY: 263 mg/dL — AB (ref 70–99)
GLUCOSE-CAPILLARY: 313 mg/dL — AB (ref 70–99)
GLUCOSE-CAPILLARY: 306 mg/dL — AB (ref 70–99)
Glucose-Capillary: 136 mg/dL — ABNORMAL HIGH (ref 70–99)
Glucose-Capillary: 231 mg/dL — ABNORMAL HIGH (ref 70–99)
Glucose-Capillary: 135 mg/dL — ABNORMAL HIGH (ref 70–99)

## 2013-12-31 LAB — CBC
HCT: 27.9 % — ABNORMAL LOW (ref 36.0–46.0)
Hemoglobin: 9.3 g/dL — ABNORMAL LOW (ref 12.0–15.0)
MCH: 29.2 pg (ref 26.0–34.0)
MCHC: 33.3 g/dL (ref 30.0–36.0)
MCV: 87.7 fL (ref 78.0–100.0)
Platelets: 128 10*3/uL — ABNORMAL LOW (ref 150–400)
RBC: 3.18 MIL/uL — AB (ref 3.87–5.11)
RDW: 18 % — AB (ref 11.5–15.5)
WBC: 5.3 10*3/uL (ref 4.0–10.5)

## 2013-12-31 MED ORDER — INSULIN ASPART 100 UNIT/ML ~~LOC~~ SOLN
0.0000 [IU] | Freq: Three times a day (TID) | SUBCUTANEOUS | Status: DC
  Administered 2013-12-31: 3 [IU] via SUBCUTANEOUS
  Administered 2013-12-31: 11 [IU] via SUBCUTANEOUS
  Administered 2013-12-31: 15 [IU] via SUBCUTANEOUS
  Administered 2014-01-01: 11 [IU] via SUBCUTANEOUS
  Administered 2014-01-01: 3 [IU] via SUBCUTANEOUS

## 2013-12-31 MED ORDER — SODIUM CHLORIDE 0.9 % IJ SOLN
10.0000 mL | INTRAMUSCULAR | Status: DC | PRN
  Administered 2013-12-31 – 2014-01-01 (×2): 10 mL

## 2013-12-31 NOTE — Progress Notes (Signed)
SATURATION QUALIFICATIONS: (This note is used to comply with regulatory documentation for home oxygen)  Patient Saturations on Room Air at Rest = 96%  Patient Saturations on Room Air while Ambulating = 94%  Patient Saturations on Liters of oxygen while Ambulating = %  Please briefly explain why patient needs home oxygen:

## 2013-12-31 NOTE — Progress Notes (Signed)
CHART NOTE I saw Janice Ball today for a social visit. The patient was recently admitted to [facility] with questionable pneumonia. She is feeling a little bit better today. She is currently on treatment with IV Rocephin and azithromycin. She denied having any significant fever or chills. She has no nausea or vomiting. Her chemotherapy will be on hold for now. Thank you so much for taking good care of [name]. Please call if you have any questions.

## 2013-12-31 NOTE — Progress Notes (Signed)
Inpatient Diabetes Program Recommendations  AACE/ADA: New Consensus Statement on Inpatient Glycemic Control (2013)  Target Ranges:  Prepandial:   less than 140 mg/dL      Peak postprandial:   less than 180 mg/dL (1-2 hours)      Critically ill patients:  140 - 180 mg/dL   Reason for Visit: Hyperglycemia  Diabetes history: Type 2 DM Outpatient Diabetes medications: metformin 1000 mg bid an Amaryl 2 mg QAM Current orders for Inpatient glycemic control: Novolog resistant tidwc and hs, Levemir 10 units QHS  Results for Janice Ball, Janice Ball (MRN [id number]) as of 12/31/2013 13:21  Ref. Range 12/30/2013 12:07 12/30/2013 17:03 12/30/2013 22:06 12/31/2013 07:08 12/31/2013 11:15  Glucose-Capillary Latest Range: 70-99 mg/dL 201 (H) 263 (H) 234 (H) 135 (H) 263 (H)   FBS looks good with Levemir 10 units. Will likely benefit from meal coverage insulin while on steroids.  Inpatient Diabetes Program Recommendations Insulin - Basal: Levemir 10 units QHS Correction (SSI): Increased to Novolog resistant tidwc and hs Insulin - Meal Coverage: Please consider Novolog 3 units tidwc for meal coverage insulin if pt eats >50% meal.  Note: Will continue to follow. Thank you. Janice Ball, RD, LDN, CDE Inpatient Diabetes Coordinator [phone]

## 2013-12-31 NOTE — Progress Notes (Addendum)
[facility] PROGRESS NOTE  Janice Ball HOZ:[id number] DOB: 02/22/1956 DOA: 12/28/2013 PCP: [name], NP  Assessment/Plan  CAP, no evidence of pneumonia on CXR or CT chest, temperature trending down -  continue IV Rocephin and Azithromycin, day 3 -  UA negative -  BCx NGTD  Acute COPD exacerbation, improving, but still SOB with talking and getting up to use bathroom -  Wean to prednisone 60mg  daily today -  Continue to duonebs q4h  -  Continue budesonide and aformoterol -  Continue tessalon  -  walking pulse ox   Lung Cancer- Stage III, Recurrence x3, currently on Chemotherapy,  -  Dr. [name] added to rounding list -  Next chemo due 3/25.  Needs CT ab/pelvis for restaging, already had CTangio chest during this admission   Sinus Tachycardia, likely related to pneumonia/COPD and bronchodilators -  TSH 7.92 -  CT a chest neg for PE   Lactic Acidosis -  D/c IVF -  Continue to hold metformin  DM2, CBG rising likely secondary to steroids - hold oral meds -  Increase to high dose SSI  -  Continue levemir 10 units -  HbA1C. 7.3  Hyperlipidemia, stable, continue Krill Oil   Hypothyroidism, abnormal TSH, likely sick euthyroid plus receiving steroids -  fT3 1.7 and fT4 0.93 wnl -  Repeat TFTs as outpatient in 4 weeks -  Continue synthroid at current dose  Anemia of chronic disease with hemoglobin now stable around 9mg /dl -  Occult stool -  Iron studies mixed -  B12 820, folate 963  Lab Results  Component Value Date   IRON 56 12/30/2013   TIBC 317 12/30/2013   FERRITIN 204 12/30/2013    Diet:  diabetic Access:  PIV IVF:  off Proph:  lovenox  Code Status: full Family Communication: patient alone Disposition Plan:  Possibly home tomorrow if less SOB.  Will need home nebulizer machine   Consultants:  Dr. [name], Onc, added to rounding list  Procedures:  CXR  CT angio chest  Antibiotics:  Ceftriaxone 3/14  Azithromycin 3/14    HPI/Subjective:  Breathing treatments helping.  Was able to walk very slowly to vending machine but had to take a nap and rest afterwards.  Got very winded.  Woke up very SOB and had to call for sooner breathing tx.    Objective: Filed Vitals:   12/31/13 0545 12/31/13 0813 12/31/13 0925 12/31/13 1016  BP: 109/66  119/64 117/63  Pulse: 101  112 106  Temp: 98 F (36.7 C)  98.2 F (36.8 C) 98.2 F (36.8 C)  TempSrc: Oral  Oral Oral  Resp: 16  17 20   Height:      Weight:      SpO2: 95% 96% 98% 95%    Intake/Output Summary (Last 24 hours) at 12/31/13 1118 Last data filed at 12/30/13 1809  Gross per 24 hour  Intake    840 ml  Output      0 ml  Net    840 ml   Filed Weights   12/28/13 2201 12/29/13 0343  Weight: 84.823 kg (187 lb) 87.5 kg (192 lb 14.4 oz)    Exam:   General:  CF, no acute distress currently  HEENT:  NCAT, MMM  Cardiovascular:  RRR, nl S1, S2 no mrg, 2+ pulses, warm extremities  Respiratory:  diminished at bases to mid-back, mid-exp wheez, no focal rales or rhonchi, no increased WOB  Abdomen:   NABS, soft, NT/ND  MSK:   Normal  tone and bulk, no LEE  Neuro:  Grossly intact  Data Reviewed: Basic Metabolic Panel:  Recent Labs Lab 12/26/13 1003 12/28/13 2240 12/29/13 0507 12/30/13 0525 12/31/13 0602  NA 137 136* 138 140 141  K 4.1 4.2 3.9 4.1 3.9  CL  --  96 102 102 104  CO2 24 22 23 23 28   GLUCOSE 187* 141* 212* 160* 137*  BUN 6.8* 7 6 9 13   CREATININE 0.7 0.63 0.68 0.54 0.64  CALCIUM 8.6 8.7 7.9* 8.3* 8.1*   Liver Function Tests:  Recent Labs Lab 12/26/13 1003 12/28/13 2240  AST 29 48*  ALT 39 49*  ALKPHOS 73 71  BILITOT 0.21 0.4  PROT 6.7 7.1  ALBUMIN 3.4* 3.5   No results found for this basename: LIPASE, AMYLASE,  in the last 168 hours No results found for this basename: AMMONIA,  in the last 168 hours CBC:  Recent Labs Lab 12/26/13 1003 12/28/13 2240 12/29/13 0507 12/30/13 0525 12/31/13 0602  WBC 3.7* 6.7 5.5  7.0 5.3  NEUTROABS 2.1 5.9  --   --   --   HGB 11.3* 11.1* 9.4* 9.9* 9.3*  HCT 34.3* 32.1* 28.5* 29.2* 27.9*  MCV 86.4 85.8 87.7 86.1 87.7  PLT 238 161 148* 155 128*   Cardiac Enzymes: No results found for this basename: CKTOTAL, CKMB, CKMBINDEX, TROPONINI,  in the last 168 hours BNP (last 3 results) No results found for this basename: PROBNP,  in the last 8760 hours CBG:  Recent Labs Lab 12/30/13 0736 12/30/13 1207 12/30/13 1703 12/30/13 2206 12/31/13 0708  GLUCAP 209* 201* 263* 234* 135*    Recent Results (from the past 240 hour(s))  CULTURE, BLOOD (ROUTINE X 2)     Status: None   Collection Time    12/28/13 10:22 PM      Result Value Ref Range Status   Specimen Description BLOOD LEFT ARM   Final   Special Requests BOTTLES DRAWN AEROBIC AND ANAEROBIC 5CC   Final   Culture  Setup Time     Final   Value: 12/29/2013 00:46     Performed at [facility]   Culture     Final   Value:        BLOOD CULTURE RECEIVED NO GROWTH TO DATE CULTURE WILL BE HELD FOR 5 DAYS BEFORE ISSUING A FINAL NEGATIVE REPORT     Performed at [facility]   Report Status PENDING   Incomplete  CULTURE, BLOOD (ROUTINE X 2)     Status: None   Collection Time    12/28/13 10:40 PM      Result Value Ref Range Status   Specimen Description BLOOD RIGHT ARM   Final   Special Requests BOTTLES DRAWN AEROBIC AND ANAEROBIC 5CC   Final   Culture  Setup Time     Final   Value: 12/29/2013 00:46     Performed at [facility]   Culture     Final   Value:        BLOOD CULTURE RECEIVED NO GROWTH TO DATE CULTURE WILL BE HELD FOR 5 DAYS BEFORE ISSUING A FINAL NEGATIVE REPORT     Performed at [facility]   Report Status PENDING   Incomplete  URINE CULTURE     Status: None   Collection Time    12/28/13 10:52 PM      Result Value Ref Range Status   Specimen Description URINE, CLEAN CATCH   Final   Special Requests  NONE   Final   Culture  Setup Time     Final   Value: 12/29/2013  04:17     Performed at [place]     Final   Value: NO GROWTH     Performed at [facility]   Culture     Final   Value: NO GROWTH     Performed at [facility]   Report Status 12/30/2013 FINAL   Final     Studies: No results found.  Scheduled Meds: . arformoterol  15 mcg Nebulization BID  . aspirin  325 mg Oral q morning - 10a  . azithromycin  500 mg Oral Daily  . benzonatate  100 mg Oral TID  . budesonide (PULMICORT) nebulizer solution  0.5 mg Nebulization BID  . cefTRIAXone (ROCEPHIN)  IV  1 g Intravenous Q24H  . cholecalciferol  1,000 Units Oral q morning - 10a  . cyanocobalamin  1,500 mcg Oral q morning - 10a  . enoxaparin (LOVENOX) injection  40 mg Subcutaneous Q24H  . guaiFENesin  1,200 mg Oral BID  . insulin aspart  0-20 Units Subcutaneous TID WC  . insulin aspart  0-5 Units Subcutaneous QHS  . insulin detemir  10 Units Subcutaneous QHS  . ipratropium-albuterol  3 mL Nebulization QID  . levothyroxine  125 mcg Oral QAC breakfast  . magnesium gluconate  500 mg Oral q morning - 10a  . multivitamin with minerals  1 tablet Oral q morning - 10a  . predniSONE  60 mg Oral Q breakfast  . sertraline  150 mg Oral QHS  . sodium chloride  3 mL Intravenous Q12H  . spironolactone  25 mg Oral q morning - 10a   Continuous Infusions:   Principal Problem:   CAP (community acquired pneumonia) Active Problems:   CARCINOMA, LUNG, SQUAMOUS CELL   HYPOTHYROIDISM   DIABETES MELLITUS, TYPE II   HYPERLIPIDEMIA   COPD UNSPECIFIED   Sinus tachycardia   Lactic acidosis    Time spent: 30 min    Garrus Gauthreaux, [place]  [facility] Pager [phone]. If 7PM-7AM, please contact night-coverage at [email or web address], password [facility] 12/31/2013, 11:18 AM  LOS: 3 days

## 2014-01-01 ENCOUNTER — Ambulatory Visit (HOSPITAL_COMMUNITY): Payer: Medicare Other

## 2014-01-01 LAB — GLUCOSE, CAPILLARY
GLUCOSE-CAPILLARY: 135 mg/dL — AB (ref 70–99)
Glucose-Capillary: 268 mg/dL — ABNORMAL HIGH (ref 70–99)

## 2014-01-01 LAB — CBC
HCT: 28.1 % — ABNORMAL LOW (ref 36.0–46.0)
Hemoglobin: 9.5 g/dL — ABNORMAL LOW (ref 12.0–15.0)
MCH: 29.2 pg (ref 26.0–34.0)
MCHC: 33.8 g/dL (ref 30.0–36.0)
MCV: 86.5 fL (ref 78.0–100.0)
PLATELETS: 110 10*3/uL — AB (ref 150–400)
RBC: 3.25 MIL/uL — AB (ref 3.87–5.11)
RDW: 17.9 % — AB (ref 11.5–15.5)
WBC: 3.5 10*3/uL — AB (ref 4.0–10.5)

## 2014-01-01 LAB — BASIC METABOLIC PANEL
BUN: 16 mg/dL (ref 6–23)
CO2: 28 mEq/L (ref 19–32)
Calcium: 8.3 mg/dL — ABNORMAL LOW (ref 8.4–10.5)
Chloride: 101 mEq/L (ref 96–112)
Creatinine, Ser: 0.68 mg/dL (ref 0.50–1.10)
GFR calc non Af Amer: >90 mL/min (ref >90)
Glucose, Bld: 138 mg/dL — ABNORMAL HIGH (ref 70–99)
POTASSIUM: 3.7 meq/L (ref 3.7–5.3)
Sodium: 142 mEq/L (ref 137–147)

## 2014-01-01 MED ORDER — PREDNISONE 50 MG PO TABS: 50.0000 mg | ORAL_TABLET | Freq: Every day | ORAL | Status: DC

## 2014-01-01 MED ORDER — PREDNISONE 10 MG PO TABS: ORAL_TABLET | ORAL | Status: DC

## 2014-01-01 MED ORDER — INSULIN ASPART 100 UNIT/ML ~~LOC~~ SOLN
4.0000 [IU] | Freq: Three times a day (TID) | SUBCUTANEOUS | Status: DC
  Administered 2014-01-01 (×2): 4 [IU] via SUBCUTANEOUS

## 2014-01-01 MED ORDER — IPRATROPIUM BROMIDE 0.02 % IN SOLN: 0.5000 mg | Freq: Four times a day (QID) | RESPIRATORY_TRACT | Status: DC

## 2014-01-01 MED ORDER — ALBUTEROL SULFATE (2.5 MG/3ML) 0.083% IN NEBU: 2.5000 mg | INHALATION_SOLUTION | Freq: Four times a day (QID) | RESPIRATORY_TRACT | Status: DC | PRN

## 2014-01-01 MED ORDER — LEVOFLOXACIN 750 MG PO TABS: 750.0000 mg | ORAL_TABLET | Freq: Every day | ORAL | Status: DC

## 2014-01-01 MED ORDER — BENZONATATE 100 MG PO CAPS: 100.0000 mg | ORAL_CAPSULE | Freq: Three times a day (TID) | ORAL | Status: DC

## 2014-01-01 MED ORDER — PREDNISONE 50 MG PO TABS
60.0000 mg | ORAL_TABLET | Freq: Every day | ORAL | Status: DC
  Administered 2014-01-01: 60 mg via ORAL
  Filled 2014-01-01 (×2): qty 1

## 2014-01-01 MED ORDER — INSULIN ASPART 100 UNIT/ML ~~LOC~~ SOLN: 0.0000 [IU] | Freq: Three times a day (TID) | SUBCUTANEOUS | Status: DC

## 2014-01-01 MED ORDER — HEPARIN SOD (PORK) LOCK FLUSH 100 UNIT/ML IV SOLN
500.0000 [IU] | INTRAVENOUS | Status: AC | PRN
  Administered 2014-01-01: 500 [IU]

## 2014-01-01 MED ORDER — INSULIN ASPART 100 UNIT/ML FLEXPEN: 0.0000 [IU] | PEN_INJECTOR | Freq: Three times a day (TID) | SUBCUTANEOUS | Status: DC

## 2014-01-01 NOTE — Discharge Summary (Addendum)
Physician Discharge Summary  Janice Ball UKG:[id number] DOB: August 10, 1956 DOA: 12/28/2013  PCP: [name], NP  Admit date: 12/28/2013 Discharge date: 01/01/2014  Recommendations for Outpatient Follow-up:  1. Follow up with primary care doctor in 1 week of discharge.  Repeat CBC and BMP.  Please review CBGs.  Repeat TFTs in 2-4 weeks please.   2. Oncology, Dr. [name] in 1 week 3. F/u with Dr. Chase Caller as needed  Discharge Diagnoses:  Principal Problem:   CAP (community acquired pneumonia) Active Problems:   CARCINOMA, LUNG, SQUAMOUS CELL   HYPOTHYROIDISM   DIABETES MELLITUS, TYPE II   HYPERLIPIDEMIA   COPD with acute exacerbation   Sinus tachycardia   Lactic acidosis   Discharge Condition: stable, improved  Diet recommendation: diabetic diet  Wt Readings from Last 3 Encounters:  12/29/13 87.5 kg (192 lb 14.4 oz)  12/19/13 85.14 kg (187 lb 11.2 oz)  11/28/13 85.004 kg (187 lb 6.4 oz)    History of present illness:   Janice Ball is a 58 y.o. female with a history of Squamous Cell Lung Cancer originally diagnosed in 05/2009 with history of radiation treatment and chemotherapy Now with 3rd recurrence and currently receiving Chemotherapy, and her last chemotherapy was on 03/11. She presents to the ED with complaints of fevers, chills, cough and SOB which began at 6pm tonight. She came into the ED with concerns that she may have Neutropenic Fever. She was evaluated in the ED and was not found to be neutropenic, and was diagnosed clinically with CAP pneumonia and placed on IV Rocephin and Azithromycin and referred for admission.   Hospital Course:   CAP, no evidence of pneumonia on CXR or CT chest, but pneumonia suspected clinically given her symptoms and she would have received antibiotics regardless for COPD exacerbation.  Her temperature trended down with antibiotics.  UA was negative and BCx NGTD.  She will continue levofloxacin at home to complete a 7 day course of  antibiotics.    Acute COPD exacerbation.  She was initially dyspneic at rest.  She was started on solumedrol and quickly transitioned to prednisone.  She was given bronchodilators and budesonide and arformoterol which also helped.  She was able to ambulate with oxygen levels in the normal range and without serious dyspnea on the day of discharge.  She should continue LABA/ICS and BD at home with a prednisone taper.    Lung Cancer- Stage III, Recurrence x3, currently on Chemotherapy, she was seen by Dr. [name] during admission.  Next chemo due 3/25. Needs CT ab/pelvis for restaging, already had CTangio chest during this admission.  Sinus Tachycardia, likely related to pneumonia/COPD and bronchodilators.  TSH 7.92, CT a chest neg for PE.  Mild tachycardia persisted despite improvement in other symptoms so this is probably mostly related to BD.    Lactic Acidosis with 2.67.  Likely due to dehydration.  Her metformin was held and she was given IVF.     DM2, CBG rising likely secondary to steroids.  Her oral medications were held and she was given SSI.  Her CBG trended up to the 300s despite escalating doses of insulin.  Will try to taper steroids as quickly as possible and she was given instructions on use of insulin pens to use for SSI at home while taking steroids.  She may resume her glimepiride after her steroids are complete.  HbA1C. 7.3   Hyperlipidemia, stable, continued [employer]   Hypothyroidism, abnormal TSH, likely sick euthyroid plus receiving steroids. fT3 1.7  and fT4 0.93 wnl.  No changes were made to her levothyroxine due to her acute illness.  Repeat TFTs as outpatient in 2-4 weeks to ensure TSH is returning to 1-2 range and if not, she may need dose adjustment.    Anemia of chronic disease with hemoglobin now stable around 9mg /dl.  Occult stool not completed during admission.   - Iron studies mixed, see below - B12 820, folate 963  Lab Results   Component  Value  Date    IRON  56   12/30/2013    TIBC  317  12/30/2013    FERRITIN  204  12/30/2013    Leukopenia, may be secondary to resolving infection, tapering steroids in the setting of chemotherapy Thrombocytopenia likely related to recent chemotherapy.  Repeat CBC in 1 week as outpatient.   Consultants:  Dr. [name], Onc, added to rounding list Procedures:  CXR  CT angio chest Antibiotics:  Ceftriaxone 3/14  Azithromycin 3/14   Discharge Exam: Filed Vitals:   01/01/14 1029  BP: 126/64  Pulse: 108  Temp: 97.9 F (36.6 C)  Resp: 20   Filed Vitals:   01/01/14 0201 01/01/14 0605 01/01/14 1029 01/01/14 1232  BP: 125/76 126/78 126/64   Pulse: 98 96 108   Temp: 97.7 F (36.5 C) 97.9 F (36.6 C) 97.9 F (36.6 C)   TempSrc: Oral Oral Oral   Resp: 18 18 20    Height:      Weight:      SpO2: 96% 95% 98% 95%    General: CF, no acute distress currently  HEENT: NCAT, MMM  Cardiovascular: RRR, nl S1, S2 no mrg, 2+ pulses, warm extremities  Respiratory: diminished at bases, no wheeze, no focal rales or rhonchi, no increased WOB  Abdomen: NABS, soft, NT/ND  MSK: Normal tone and bulk, no LEE  Neuro: Grossly intact   Discharge Instructions      Discharge Orders   Future Appointments Provider Department Dept Phone   01/02/2014 11:30 AM [facility] [phone]   01/09/2014 10:15 AM [facility] [phone]   01/09/2014 10:45 AM [facility] [phone]   01/09/2014 12:00 PM [facility] [phone]   01/16/2014 10:30 AM [facility] [phone]   01/16/2014 11:00 AM [facility] [phone]   01/23/2014 11:00 AM [facility] [phone]   01/30/2014 10:30 AM [facility] [phone]   01/30/2014 11:00 AM [facility] [phone]   02/06/2014 10:30 AM [facility] [phone]   02/06/2014 11:00 AM [facility] [phone]   02/13/2014 11:00 AM [facility] [phone]   Future Orders Complete By Expires   Diet - low sodium heart healthy  As directed    Discharge instructions  As directed    Comments:     You were hospitalized with possible pneumonia and COPD exacerbation.  Your fever improved with antibiotics and you should continue antibiotics for a total of 7 days, and then stop.  Please take all of your antibiotics until gone even if you are feeling better.  For your COPD exacerbation, please start  a tapering dose of steroids with prednisone.  Please used your albuterol inhaler as needed and resume use of your symbicort.  Follow up with your primary care doctor in 1 week for reevaluation.  Dr. [name] will see you back in 1 week for consideration of chemotherapy.  If you have worsening shortness of breath or fever, please return to the hospital immediately.  Because of your steroids, your blood sugars have been quite high.  Please stop your glimepiride and use sliding scale insulin with meals for the days that you are taking steroids.  Once your steroids are complete, stop your insulin and resume your glimepiride.   Increase activity slowly  As directed        Medication List    STOP taking these medications       glimepiride 2 MG tablet  Commonly known as:  AMARYL      TAKE these medications       acetaminophen 325 MG tablet  Commonly known as:  TYLENOL  Take 650 mg by mouth every 6 (six) hours as needed for mild pain or headache.     albuterol 108 (90 BASE) MCG/ACT inhaler  Commonly known as:  PROVENTIL HFA;VENTOLIN HFA   Inhale 2 puffs into the lungs every 6 (six) hours as needed for wheezing or shortness of breath.     albuterol (2.5 MG/3ML) 0.083% nebulizer solution  Commonly known as:  PROVENTIL  Take 3 mLs (2.5 mg total) by nebulization every 6 (six) hours as needed for wheezing or shortness of breath.     aspirin 325 MG tablet  Take 325 mg by mouth every morning.     benzonatate 100 MG capsule  Commonly known as:  TESSALON  Take 1 capsule (100 mg total) by mouth 3 (three) times daily.     budesonide-formoterol 160-4.5 MCG/ACT inhaler  Commonly known as:  SYMBICORT  Inhale 2 puffs into the lungs 2 (two) times daily.     cholecalciferol 1000 UNITS tablet  Commonly known as:  VITAMIN D  Take 1,000 Units by mouth every morning.     Chromium 1000 MCG Tabs  Take 1 tablet by mouth every morning.     cyanocobalamin 1000 MCG tablet  Take 1,500 mcg by mouth every morning.     guaiFENesin 600 MG 12 hr tablet  Commonly known as:  MUCINEX  Take 1,200 mg by mouth 2 (two) times daily.     insulin aspart 100 UNIT/ML injection  Commonly known as:  novoLOG  Inject 0-20 Units into the skin 3 (three) times daily before meals.     ipratropium 0.02 % nebulizer solution  Commonly known as:  ATROVENT  Take 2.5 mLs (0.5 mg total) by nebulization 4 (four) times daily.     KRILL OIL PO  Take 1 tablet by mouth every morning.     levofloxacin 750 MG tablet  Commonly known as:  LEVAQUIN  Take 1 tablet (750 mg total) by mouth daily.     levothyroxine 125 MCG tablet  Commonly known as:  SYNTHROID, LEVOTHROID  Take 125 mcg by mouth daily before breakfast.     lidocaine-prilocaine cream  Commonly known as:  EMLA  Apply 1 application topically as needed (skin numbing).     LORazepam 1 MG tablet  Commonly known as:  ATIVAN  Take 1 mg by mouth 4 (four) times daily as needed for anxiety.     magnesium gluconate 500 MG tablet  Commonly known as:  MAGONATE  Take 500  mg by mouth every morning.      metFORMIN 1000 MG tablet  Commonly known as:  GLUCOPHAGE  Take 1,000 mg by mouth 2 (two) times daily with a meal.     multivitamin with minerals Tabs tablet  Take 1 tablet by mouth every morning.     predniSONE 10 MG tablet  Commonly known as:  DELTASONE  Take 5 tabs daily x 1 day, 4 tabs x 1 day, 3 tabs x 1 day, 2 tabs x 1 day, 1 tab x 1 day, then stop     prochlorperazine 10 MG tablet  Commonly known as:  COMPAZINE  Take 10 mg by mouth every 6 (six) hours as needed for nausea or vomiting.     sertraline 100 MG tablet  Commonly known as:  ZOLOFT  Take 150 mg by mouth at bedtime. Takes 1 1/2 tabs daily for total dose of 150 mg     spironolactone 25 MG tablet  Commonly known as:  ALDACTONE  Take 25 mg by mouth every morning.       Follow-up Information   Follow up with [facility], NP. Schedule an appointment as soon as possible for a visit in 1 week.   Specialty:  Nurse Practitioner   Contact information:   [place] [address] [phone]       Follow up with Eilleen Kempf., MD. (already schedule appt in 1 week)    Specialty:  Oncology   Contact information:   [place] [address] [phone]        The results of significant diagnostics from this hospitalization (including imaging, microbiology, ancillary and laboratory) are listed below for reference.    Significant Diagnostic Studies: Ct Angio Chest Pe W/cm &/or Wo Cm  12/29/2013   CLINICAL DATA:  Dorsey Charette of breath and tachycardia.  Lung cancer.  EXAM: CT ANGIOGRAPHY CHEST WITH CONTRAST  TECHNIQUE: Multidetector CT imaging of the chest was performed using the standard protocol during bolus administration of intravenous contrast. Multiplanar CT image reconstructions and MIPs were obtained to evaluate the vascular anatomy.  CONTRAST:  120mL OMNIPAQUE IOHEXOL 350 MG/ML SOLN  COMPARISON:  CT chest 10/05/2013  FINDINGS: Negative for pulmonary embolism. Negative for aortic  aneurysm or dissection. Atherosclerotic calcification in the aortic arch. Mild pericardial thickening anteriorly may be due to radiation treatment. No pericardial effusion. Heart size is normal.  Right hilar soft tissue thickening compatible with treated tumor and radiation change. There is slight increase in soft tissue thickening in the right upper lobe posterior to the hilum. This may be due to radiation change or tumor growth. Continued close followup is warranted. Right peritracheal lymph node measures 11 mm in diameter and has some calcification. This appears slightly smaller in size compared with the prior study.  Negative for pneumonia.  Negative for effusion.  Review of the MIP images confirms the above findings.  IMPRESSION: Negative for pulmonary embolism.  Right hilar and right upper lobe tumor with radiation change. There is increased soft tissue density extending into the posterior apical segment of the right upper lobe compared with the prior study which could be due to progressive radiation change versus tumor growth. Close followup is warranted.   Electronically Signed   By: Franchot Gallo M.D.   On: 12/29/2013 01:36   Dg Chest Port 1 View  12/28/2013   CLINICAL DATA:  Cough, fever and chest congestion. Treated for lung cancer. Ex-smoker.  EXAM: PORTABLE CHEST - 1 VIEW  COMPARISON:  Previous examinations, including the PET-CT dated 10/24/2013.  FINDINGS: Stable mass like area in the medial aspect of the right upper lobe with previously demonstrated postradiation changes at that location. The remainder of the lungs are clear and mildly hyperexpanded. Stable right jugular port catheter. Normal sized heart. Mild scoliosis.  IMPRESSION: No acute abnormality. COPD and treated right upper lobe lung cancer.   Electronically Signed   By: Enrique Sack M.D.   On: 12/28/2013 23:59    Microbiology: Recent Results (from the past 240 hour(s))  CULTURE, BLOOD (ROUTINE X 2)     Status: None   Collection  Time    12/28/13 10:22 PM      Result Value Ref Range Status   Specimen Description BLOOD LEFT ARM   Final   Special Requests BOTTLES DRAWN AEROBIC AND ANAEROBIC 5CC   Final   Culture  Setup Time     Final   Value: 12/29/2013 00:46     Performed at [facility]   Culture     Final   Value:        BLOOD CULTURE RECEIVED NO GROWTH TO DATE CULTURE WILL BE HELD FOR 5 DAYS BEFORE ISSUING A FINAL NEGATIVE REPORT     Performed at [facility]   Report Status PENDING   Incomplete  CULTURE, BLOOD (ROUTINE X 2)     Status: None   Collection Time    12/28/13 10:40 PM      Result Value Ref Range Status   Specimen Description BLOOD RIGHT ARM   Final   Special Requests BOTTLES DRAWN AEROBIC AND ANAEROBIC 5CC   Final   Culture  Setup Time     Final   Value: 12/29/2013 00:46     Performed at [facility]   Culture     Final   Value:        BLOOD CULTURE RECEIVED NO GROWTH TO DATE CULTURE WILL BE HELD FOR 5 DAYS BEFORE ISSUING A FINAL NEGATIVE REPORT     Performed at [facility]   Report Status PENDING   Incomplete  URINE CULTURE     Status: None   Collection Time    12/28/13 10:52 PM      Result Value Ref Range Status   Specimen Description URINE, CLEAN CATCH   Final   Special Requests NONE   Final   Culture  Setup Time     Final   Value: 12/29/2013 04:17     Performed at [place]     Final   Value: NO GROWTH     Performed at [facility]   Culture     Final   Value: NO GROWTH     Performed at [facility]   Report Status 12/30/2013 FINAL   Final     Labs: Basic Metabolic Panel:  Recent Labs Lab 12/28/13 2240 12/29/13 0507 12/30/13 0525 12/31/13 0602 01/01/14 0535  NA 136* 138 140 141 142  K 4.2 3.9 4.1 3.9 3.7  CL 96 102 102 104 101  CO2 22 23 23 28 28   GLUCOSE 141* 212* 160* 137* 138*  BUN 7 6 9 13 16   CREATININE 0.63 0.68 0.54 0.64 0.68  CALCIUM 8.7 7.9* 8.3* 8.1* 8.3*   Liver Function  Tests:  Recent Labs Lab 12/26/13 1003 12/28/13 2240  AST 29 48*  ALT 39 49*  ALKPHOS 73 71  BILITOT 0.21 0.4  PROT 6.7  7.1  ALBUMIN 3.4* 3.5   No results found for this basename: LIPASE, AMYLASE,  in the last 168 hours No results found for this basename: AMMONIA,  in the last 168 hours CBC:  Recent Labs Lab 12/26/13 1003 12/28/13 2240 12/29/13 0507 12/30/13 0525 12/31/13 0602 01/01/14 0535  WBC 3.7* 6.7 5.5 7.0 5.3 3.5*  NEUTROABS 2.1 5.9  --   --   --   --   HGB 11.3* 11.1* 9.4* 9.9* 9.3* 9.5*  HCT 34.3* 32.1* 28.5* 29.2* 27.9* 28.1*  MCV 86.4 85.8 87.7 86.1 87.7 86.5  PLT 238 161 148* 155 128* 110*   Cardiac Enzymes: No results found for this basename: CKTOTAL, CKMB, CKMBINDEX, TROPONINI,  in the last 168 hours BNP: BNP (last 3 results) No results found for this basename: PROBNP,  in the last 8760 hours CBG:  Recent Labs Lab 12/31/13 1115 12/31/13 1605 12/31/13 2205 01/01/14 0747 01/01/14 1151  GLUCAP 263* 313* 306* 135* 268*    Time coordinating discharge: 45 minutes  Signed:  Rumeal Cullipher  [facility] 01/01/2014, 1:20 PM

## 2014-01-02 ENCOUNTER — Other Ambulatory Visit: Payer: Medicare Other

## 2014-01-04 LAB — CULTURE, BLOOD (ROUTINE X 2)
Culture: NO GROWTH
Culture: NO GROWTH

## 2014-01-09 ENCOUNTER — Telehealth: Payer: Self-pay | Admitting: Internal Medicine

## 2014-01-09 ENCOUNTER — Encounter: Payer: Self-pay | Admitting: Physician Assistant

## 2014-01-09 ENCOUNTER — Other Ambulatory Visit (HOSPITAL_BASED_OUTPATIENT_CLINIC_OR_DEPARTMENT_OTHER): Payer: Medicare Other

## 2014-01-09 ENCOUNTER — Ambulatory Visit: Payer: Medicare Other

## 2014-01-09 ENCOUNTER — Ambulatory Visit (HOSPITAL_BASED_OUTPATIENT_CLINIC_OR_DEPARTMENT_OTHER): Payer: Medicare Other | Admitting: Physician Assistant

## 2014-01-09 VITALS — BP 144/61 | HR 114 | Temp 97.4°F | Resp 18 | Ht 68.0 in | Wt 186.0 lb

## 2014-01-09 DIAGNOSIS — C3410 Malignant neoplasm of upper lobe, unspecified bronchus or lung: Secondary | ICD-10-CM

## 2014-01-09 DIAGNOSIS — C3490 Malignant neoplasm of unspecified part of unspecified bronchus or lung: Secondary | ICD-10-CM

## 2014-01-09 LAB — CBC WITH DIFFERENTIAL/PLATELET
BASO%: 0.3 % (ref 0.0–2.0)
BASOS ABS: 0 10*3/uL (ref 0.0–0.1)
EOS%: 1.3 % (ref 0.0–7.0)
Eosinophils Absolute: 0.1 10*3/uL (ref 0.0–0.5)
HCT: 38 % (ref 34.8–46.6)
HGB: 12.5 g/dL (ref 11.6–15.9)
LYMPH%: 10.6 % — ABNORMAL LOW (ref 14.0–49.7)
MCH: 29.2 pg (ref 25.1–34.0)
MCHC: 32.9 g/dL (ref 31.5–36.0)
MCV: 88.8 fL (ref 79.5–101.0)
MONO#: 0.9 10*3/uL (ref 0.1–0.9)
MONO%: 9.1 % (ref 0.0–14.0)
NEUT%: 78.7 % — ABNORMAL HIGH (ref 38.4–76.8)
NEUTROS ABS: 7.8 10*3/uL — AB (ref 1.5–6.5)
NRBC: 0 % (ref 0–0)
PLATELETS: 267 10*3/uL (ref 145–400)
RBC: 4.28 10*6/uL (ref 3.70–5.45)
RDW: 20 % — ABNORMAL HIGH (ref 11.2–14.5)
WBC: 9.9 10*3/uL (ref 3.9–10.3)
lymph#: 1.1 10*3/uL (ref 0.9–3.3)

## 2014-01-09 NOTE — Progress Notes (Addendum)
[facility] [phone]   [phone]  SHARED VISIT PROGRESS NOTE  Janice Chimes, NP [place] [address]  DIAGNOSIS: Recurrent non-small cell lung cancer, squamous cell carcinoma initially diagnosis stage IIIa (T1 N2 MX ) in August of 2010.   PRIOR THERAPY:  #1 status post concurrent chemoradiation with weekly carboplatin and paclitaxel, last dose of chemotherapy given 08/05/2011.  #2 status post consolidation chemotherapy with carboplatin paclitaxel last dose given 10/27/2009.  #3 Concurrent chemoradiation with weekly carboplatin for an AUC of 2 and paclitaxel at 45 mg per meter squared given concurrent with radiotherapy, last dose was given 09/20/2011.  #4 Systemic chemotherapy with gemcitabine 1000 mg meter squared on days 1 and 8 every 3 weeks status post 3 cycles with stable disease, last dose was given 12/20/2011.   CURRENT THERAPY: Systemic chemotherapy again with single agent gemcitabine 1000 mg/M2 on days 1 and 8 every 3 weeks. First cycle 11/07/2013. Status post 3 cycles  INTERVAL HISTORY: Janice Ball 58 y.o. female returns to the clinic today for a symptom management visit prior to proceeding with cycle #4 of her systemic chemotherapy with single agent gemcitabine. She was discharged from the hospital on 01/01/2014. She was admitted with community-acquired pneumonia. She reports that her breathing is a improved and has returned to her baseline level of shortness of breath. She continues to do breathing treatments as prescribed. She complains of significant fatigue post hospitalization. She reports that her blood glucoses have been elevated if secondary to the steroids and her primary care physician is working with her on getting the levels down. She does not feel up to proceeding with chemotherapy today schedule because she is "too exhausted". She tolerated the last cycle of her systemic chemotherapy with gemcitabine  fairly well with no significant complaints. She denied fever or chills. She denied having any significant weight loss or night sweats. She has no chest pain, shortness of breath, cough or hemoptysis.  She denied any significant nausea or vomiting.  MEDICAL HISTORY: Past Medical History  Diagnosis Date  . Anxiety   . Hyperlipidemia   . Hypothyroidism   . COPD (chronic obstructive pulmonary disease)   . Arthritis     thumbs  . 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 Lung cancer     lung ca dx 8/10  . Lung cancer   . Diabetes mellitus     ALLERGIES:  is allergic to sulfonamide derivatives; codeine; and penicillins.  MEDICATIONS:  Current Outpatient Prescriptions  Medication Sig Dispense Refill  . acetaminophen (TYLENOL) 325 MG tablet Take 650 mg by mouth every 6 (six) hours as needed for mild pain or headache.       . albuterol (PROVENTIL HFA;VENTOLIN HFA) 108 (90 BASE) MCG/ACT inhaler Inhale 2 puffs into the lungs every 6 (six) hours as needed for wheezing or shortness of breath.      Marland Kitchen albuterol (PROVENTIL) (2.5 MG/3ML) 0.083% nebulizer solution Take 3 mLs (2.5 mg total) by nebulization every 6 (six) hours as needed for wheezing or shortness of breath.  150 mL  0  . aspirin 325 MG tablet Take 325 mg by mouth every morning.       . benzonatate (TESSALON) 100 MG capsule Take 1 capsule (100 mg total) by mouth 3 (three) times daily.  20 capsule  0  . budesonide-formoterol (SYMBICORT) 160-4.5 MCG/ACT inhaler Inhale 2 puffs into the lungs 2 (two) times daily.      Marland Kitchen  cholecalciferol (VITAMIN D) 1000 UNITS tablet Take 1,000 Units by mouth every morning.       . Chromium 1000 MCG TABS Take 1 tablet by mouth every morning.       . cyanocobalamin 1000 MCG tablet Take 1,500 mcg by mouth every morning.      Marland Kitchen glimepiride (AMARYL) 2 MG tablet       . guaiFENesin (MUCINEX) 600 MG 12 hr tablet Take 1,200 mg by mouth 2 (two) times daily.      . insulin aspart (NOVOLOG) 100 UNIT/ML  injection Inject 0-20 Units into the skin 3 (three) times daily before meals.  10 mL  11  . ipratropium (ATROVENT) 0.02 % nebulizer solution Take 2.5 mLs (0.5 mg total) by nebulization 4 (four) times daily.  150 mL  0  . KRILL OIL PO Take 1 tablet by mouth every morning.       Marland Kitchen levothyroxine (SYNTHROID, LEVOTHROID) 125 MCG tablet Take 125 mcg by mouth daily before breakfast.       . lidocaine-prilocaine (EMLA) cream Apply 1 application topically as needed (skin numbing).      . LORazepam (ATIVAN) 1 MG tablet Take 1 mg by mouth 4 (four) times daily as needed for anxiety.      . magnesium gluconate (MAGONATE) 500 MG tablet Take 500 mg by mouth every morning.       . metFORMIN (GLUCOPHAGE) 1000 MG tablet Take 1,000 mg by mouth 2 (two) times daily with a meal.      . Multiple Vitamin (MULTIVITAMIN WITH MINERALS) TABS tablet Take 1 tablet by mouth every morning.      . prochlorperazine (COMPAZINE) 10 MG tablet Take 10 mg by mouth every 6 (six) hours as needed for nausea or vomiting.      . sertraline (ZOLOFT) 100 MG tablet Take 150 mg by mouth at bedtime. Takes 1 1/2 tabs daily for total dose of 150 mg      . spironolactone (ALDACTONE) 25 MG tablet Take 25 mg by mouth every morning.       Marland Kitchen CHANTIX STARTING MONTH PAK 0.5 MG X 11 & 1 MG X 42 tablet        No current facility-administered medications for this visit.    SURGICAL HISTORY:  Past Surgical History  Procedure Laterality Date  . Total abdominal hysterectomy    . Thyroidectomy, partial    . Tonsillectomy    . Tubal ligation      REVIEW OF SYSTEMS:  Constitutional: positive for fatigue Eyes: negative Ears, nose, mouth, throat, and face: negative Respiratory: positive for dyspnea on exertion Cardiovascular: negative Gastrointestinal: negative Genitourinary:negative Integument/breast: negative Hematologic/lymphatic: negative Musculoskeletal:negative Neurological: negative Behavioral/Psych: negative Endocrine:  negative Allergic/Immunologic: negative   PHYSICAL EXAMINATION: General appearance: alert, cooperative and no distress Head: Normocephalic, without obvious abnormality, atraumatic Neck: no adenopathy, no JVD, supple, symmetrical, trachea midline and thyroid not enlarged, symmetric, no tenderness/mass/nodules Lymph nodes: Cervical, supraclavicular, and axillary nodes normal. Resp: clear to auscultation bilaterally Back: symmetric, no curvature. ROM normal. No CVA tenderness. Cardio: regular rate and rhythm, S1, S2 normal, no murmur, click, rub or gallop GI: soft, non-tender; bowel sounds normal; no masses,  no organomegaly Extremities: extremities normal, atraumatic, no cyanosis or edema Neurologic: Alert and oriented X 3, normal strength and tone. Normal symmetric reflexes. Normal coordination and gait  ECOG PERFORMANCE STATUS: 0 - Asymptomatic  Blood pressure 144/61, pulse 114, temperature 97.4 F (36.3 C), temperature source Oral, resp. rate 18, height 5\' 8"  (1.727 m),  weight 186 lb (84.369 kg), SpO2 98.00%.  LABORATORY DATA: Lab Results  Component Value Date   WBC 9.9 01/09/2014   HGB 12.5 01/09/2014   HCT 38.0 01/09/2014   MCV 88.8 01/09/2014   PLT 267 01/09/2014      Chemistry      Component Value Date/Time   NA 142 01/01/2014 0535   NA 137 12/26/2013 1003   NA 141 04/04/2012 0945   K 3.7 01/01/2014 0535   K 4.1 12/26/2013 1003   K 4.7 04/04/2012 0945   CL 101 01/01/2014 0535   CL 101 04/06/2013 1041   CL 100 04/04/2012 0945   CO2 28 01/01/2014 0535   CO2 24 12/26/2013 1003   CO2 29 04/04/2012 0945   BUN 16 01/01/2014 0535   BUN 6.8* 12/26/2013 1003   BUN 16 04/04/2012 0945   CREATININE 0.68 01/01/2014 0535   CREATININE 0.7 12/26/2013 1003   CREATININE 0.9 04/04/2012 0945      Component Value Date/Time   CALCIUM 8.3* 01/01/2014 0535   CALCIUM 8.6 12/26/2013 1003   CALCIUM 8.4 04/04/2012 0945   ALKPHOS 71 12/28/2013 2240   ALKPHOS 73 12/26/2013 1003   ALKPHOS 61 04/04/2012 0945   AST  48* 12/28/2013 2240   AST 29 12/26/2013 1003   AST 31 04/04/2012 0945   ALT 49* 12/28/2013 2240   ALT 39 12/26/2013 1003   ALT 30 04/04/2012 0945   BILITOT 0.4 12/28/2013 2240   BILITOT 0.21 12/26/2013 1003   BILITOT 0.50 04/04/2012 0945       RADIOGRAPHIC STUDIES: Ct Angio Chest Pe W/cm &/or Wo Cm  12/29/2013   CLINICAL DATA:  Short of breath and tachycardia.  Lung cancer.  EXAM: CT ANGIOGRAPHY CHEST WITH CONTRAST  TECHNIQUE: Multidetector CT imaging of the chest was performed using the standard protocol during bolus administration of intravenous contrast. Multiplanar CT image reconstructions and MIPs were obtained to evaluate the vascular anatomy.  CONTRAST:  130mL OMNIPAQUE IOHEXOL 350 MG/ML SOLN  COMPARISON:  CT chest 10/05/2013  FINDINGS: Negative for pulmonary embolism. Negative for aortic aneurysm or dissection. Atherosclerotic calcification in the aortic arch. Mild pericardial thickening anteriorly may be due to radiation treatment. No pericardial effusion. Heart size is normal.  Right hilar soft tissue thickening compatible with treated tumor and radiation change. There is slight increase in soft tissue thickening in the right upper lobe posterior to the hilum. This may be due to radiation change or tumor growth. Continued close followup is warranted. Right peritracheal lymph node measures 11 mm in diameter and has some calcification. This appears slightly smaller in size compared with the prior study.  Negative for pneumonia.  Negative for effusion.  Review of the MIP images confirms the above findings.  IMPRESSION: Negative for pulmonary embolism.  Right hilar and right upper lobe tumor with radiation change. There is increased soft tissue density extending into the posterior apical segment of the right upper lobe compared with the prior study which could be due to progressive radiation change versus tumor growth. Close followup is warranted.   Electronically Signed   By: Franchot Gallo M.D.   On:  12/29/2013 01:36   Dg Chest Port 1 View  12/28/2013   CLINICAL DATA:  Cough, fever and chest congestion. Treated for lung cancer. Ex-smoker.  EXAM: PORTABLE CHEST - 1 VIEW  COMPARISON:  Previous examinations, including the PET-CT dated 10/24/2013.  FINDINGS: Stable mass like area in the medial aspect of the right upper lobe with previously demonstrated  postradiation changes at that location. The remainder of the lungs are clear and mildly hyperexpanded. Stable right jugular port catheter. Normal sized heart. Mild scoliosis.  IMPRESSION: No acute abnormality. COPD and treated right upper lobe lung cancer.   Electronically Signed   By: Enrique Sack M.D.   On: 12/28/2013 23:59    ASSESSMENT AND PLAN: This is a very pleasant 58 years old white female with recurrent non-small cell lung cancer status post concurrent chemoradiation as well as consolidation chemotherapy and she is currently on observation.  The patient is tolerating her treatment with gemcitabine fairly well. Patient was discussed with and also seen by Dr. [name]. Due to her recent hospitalization, we will postpone the start of cycle #4 of her systemic chemotherapy with single agent gemcitabine to 01/15/2014. She'll continue with labs as scheduled. She'll followup in 4 weeks for another symptom management visit prior to the start of cycle #5.  The patient voices understanding of current disease status and treatment options and is in agreement with the current care plan.  All questions were answered. The patient knows to call the clinic with any problems, questions or concerns. We can certainly see the patient much sooner if necessary.  Carlton Adam PA-C  ADDENDUM:  Hematology/Oncology Attending:  I had a face to face encounter with the patient. I recommended her care plan. This is a very pleasant 58 years old white female with recurrent non-small cell lung cancer, squamous cell carcinoma currently undergoing systemic chemotherapy with  single agent gemcitabine status post 4 cycles. The patient is rating her treatment fairly well but was recently admitted to [facility] with questionable pnemonia. She is still recovering from her recent admission and she would like to have one more week off before starting cycle #5. I would reschedule her chemotherapy to start next week. The patient would come back for follow up visit in 4 weeks with the start of cycle #6. She was advised to call immediately if she has any concerning symptoms in the interval.  Disclaimer: This note was dictated with voice recognition software. Similar sounding words can inadvertently be transcribed and may not be corrected upon review. Eilleen Kempf., MD 01/12/2014

## 2014-01-09 NOTE — Telephone Encounter (Signed)
gv and printed appt sched and avs forpt for April....sed added tx.

## 2014-01-10 NOTE — Patient Instructions (Signed)
Return in one week to proceed with your scheduled cycle of chemotherapy Continue labs and chemotherapy as scheduled Followup in 4 weeks

## 2014-01-16 ENCOUNTER — Other Ambulatory Visit: Payer: Self-pay | Admitting: Internal Medicine

## 2014-01-16 ENCOUNTER — Other Ambulatory Visit (HOSPITAL_BASED_OUTPATIENT_CLINIC_OR_DEPARTMENT_OTHER): Payer: Medicare Other

## 2014-01-16 ENCOUNTER — Ambulatory Visit (HOSPITAL_BASED_OUTPATIENT_CLINIC_OR_DEPARTMENT_OTHER): Payer: Medicare Other

## 2014-01-16 VITALS — BP 133/70 | HR 111 | Temp 97.9°F

## 2014-01-16 DIAGNOSIS — C3490 Malignant neoplasm of unspecified part of unspecified bronchus or lung: Secondary | ICD-10-CM

## 2014-01-16 DIAGNOSIS — Z5111 Encounter for antineoplastic chemotherapy: Secondary | ICD-10-CM

## 2014-01-16 DIAGNOSIS — C3410 Malignant neoplasm of upper lobe, unspecified bronchus or lung: Secondary | ICD-10-CM

## 2014-01-16 LAB — COMPREHENSIVE METABOLIC PANEL (CC13)
ALBUMIN: 3.5 g/dL (ref 3.5–5.0)
ALK PHOS: 64 U/L (ref 40–150)
ALT: 20 U/L (ref 0–55)
AST: 19 U/L (ref 5–34)
Anion Gap: 14 mEq/L — ABNORMAL HIGH (ref 3–11)
BUN: 8.8 mg/dL (ref 7.0–26.0)
CO2: 20 mEq/L — ABNORMAL LOW (ref 22–29)
CREATININE: 0.8 mg/dL (ref 0.6–1.1)
Calcium: 8.1 mg/dL — ABNORMAL LOW (ref 8.4–10.4)
Chloride: 105 mEq/L (ref 98–109)
GLUCOSE: 283 mg/dL — AB (ref 70–140)
Potassium: 4 mEq/L (ref 3.5–5.1)
Sodium: 138 mEq/L (ref 136–145)
Total Bilirubin: 0.22 mg/dL (ref 0.20–1.20)
Total Protein: 6.7 g/dL (ref 6.4–8.3)

## 2014-01-16 LAB — CBC WITH DIFFERENTIAL/PLATELET
BASO%: 1.9 % (ref 0.0–2.0)
BASOS ABS: 0.1 10*3/uL (ref 0.0–0.1)
EOS%: 2.4 % (ref 0.0–7.0)
Eosinophils Absolute: 0.2 10*3/uL (ref 0.0–0.5)
HEMATOCRIT: 37.5 % (ref 34.8–46.6)
HGB: 12.3 g/dL (ref 11.6–15.9)
LYMPH%: 16 % (ref 14.0–49.7)
MCH: 29.1 pg (ref 25.1–34.0)
MCHC: 32.8 g/dL (ref 31.5–36.0)
MCV: 88.7 fL (ref 79.5–101.0)
MONO#: 0.8 10*3/uL (ref 0.1–0.9)
MONO%: 12.4 % (ref 0.0–14.0)
NEUT#: 4.2 10*3/uL (ref 1.5–6.5)
NEUT%: 67.3 % (ref 38.4–76.8)
PLATELETS: 271 10*3/uL (ref 145–400)
RBC: 4.23 10*6/uL (ref 3.70–5.45)
RDW: 18.8 % — ABNORMAL HIGH (ref 11.2–14.5)
WBC: 6.2 10*3/uL (ref 3.9–10.3)
lymph#: 1 10*3/uL (ref 0.9–3.3)
nRBC: 0 % (ref 0–0)

## 2014-01-16 MED ORDER — HEPARIN SOD (PORK) LOCK FLUSH 100 UNIT/ML IV SOLN
500.0000 [IU] | Freq: Once | INTRAVENOUS | Status: AC | PRN
  Administered 2014-01-16: 500 [IU]
  Filled 2014-01-16: qty 5

## 2014-01-16 MED ORDER — SODIUM CHLORIDE 0.9 % IV SOLN
1000.0000 mg/m2 | Freq: Once | INTRAVENOUS | Status: AC
  Administered 2014-01-16: 2014 mg via INTRAVENOUS
  Filled 2014-01-16: qty 52.97

## 2014-01-16 MED ORDER — SODIUM CHLORIDE 0.9 % IV SOLN: Freq: Once | INTRAVENOUS | Status: DC

## 2014-01-16 MED ORDER — PROCHLORPERAZINE MALEATE 10 MG PO TABS
10.0000 mg | ORAL_TABLET | Freq: Once | ORAL | Status: AC
  Administered 2014-01-16: 10 mg via ORAL

## 2014-01-16 MED ORDER — SODIUM CHLORIDE 0.9 % IJ SOLN
10.0000 mL | INTRAMUSCULAR | Status: DC | PRN
  Administered 2014-01-16: 10 mL
  Filled 2014-01-16: qty 10

## 2014-01-16 MED ORDER — PROCHLORPERAZINE MALEATE 10 MG PO TABS
ORAL_TABLET | ORAL | Status: AC
  Filled 2014-01-16: qty 1

## 2014-01-16 NOTE — Patient Instructions (Signed)
[facility] Discharge Instructions for Patients Receiving Chemotherapy  Today you received the following chemotherapy agents Gemzar.  To help prevent nausea and vomiting after your treatment, we encourage you to take your nausea medication as prescribed.   If you develop nausea and vomiting that is not controlled by your nausea medication, call the clinic.   BELOW ARE SYMPTOMS THAT SHOULD BE REPORTED IMMEDIATELY:  *FEVER GREATER THAN 100.5 F  *CHILLS WITH OR WITHOUT FEVER  NAUSEA AND VOMITING THAT IS NOT CONTROLLED WITH YOUR NAUSEA MEDICATION  *UNUSUAL SHORTNESS OF BREATH  *UNUSUAL BRUISING OR BLEEDING  TENDERNESS IN MOUTH AND THROAT WITH OR WITHOUT PRESENCE OF ULCERS  *URINARY PROBLEMS  *BOWEL PROBLEMS  UNUSUAL RASH Items with * indicate a potential emergency and should be followed up as soon as possible.  Feel free to call the clinic you have any questions or concerns. The clinic phone number is [phone].

## 2014-01-23 ENCOUNTER — Ambulatory Visit (HOSPITAL_BASED_OUTPATIENT_CLINIC_OR_DEPARTMENT_OTHER): Payer: Medicare Other

## 2014-01-23 ENCOUNTER — Other Ambulatory Visit (HOSPITAL_BASED_OUTPATIENT_CLINIC_OR_DEPARTMENT_OTHER): Payer: Medicare Other

## 2014-01-23 ENCOUNTER — Other Ambulatory Visit: Payer: Self-pay

## 2014-01-23 VITALS — BP 125/63 | HR 105 | Temp 97.7°F

## 2014-01-23 DIAGNOSIS — C3410 Malignant neoplasm of upper lobe, unspecified bronchus or lung: Secondary | ICD-10-CM

## 2014-01-23 DIAGNOSIS — C3490 Malignant neoplasm of unspecified part of unspecified bronchus or lung: Secondary | ICD-10-CM

## 2014-01-23 DIAGNOSIS — Z5111 Encounter for antineoplastic chemotherapy: Secondary | ICD-10-CM

## 2014-01-23 LAB — CBC WITH DIFFERENTIAL/PLATELET
BASO%: 1.6 % (ref 0.0–2.0)
BASOS ABS: 0 10*3/uL (ref 0.0–0.1)
EOS%: 1.2 % (ref 0.0–7.0)
Eosinophils Absolute: 0 10*3/uL (ref 0.0–0.5)
HCT: 32.9 % — ABNORMAL LOW (ref 34.8–46.6)
HEMOGLOBIN: 11 g/dL — AB (ref 11.6–15.9)
LYMPH#: 0.9 10*3/uL (ref 0.9–3.3)
LYMPH%: 28.1 % (ref 14.0–49.7)
MCH: 29.5 pg (ref 25.1–34.0)
MCHC: 33.3 g/dL (ref 31.5–36.0)
MCV: 88.4 fL (ref 79.5–101.0)
MONO#: 0.4 10*3/uL (ref 0.1–0.9)
MONO%: 14.6 % — ABNORMAL HIGH (ref 0.0–14.0)
NEUT%: 54.5 % (ref 38.4–76.8)
NEUTROS ABS: 1.7 10*3/uL (ref 1.5–6.5)
Platelets: 139 10*3/uL — ABNORMAL LOW (ref 145–400)
RBC: 3.72 10*6/uL (ref 3.70–5.45)
RDW: 18.9 % — ABNORMAL HIGH (ref 11.2–14.5)
WBC: 3.1 10*3/uL — ABNORMAL LOW (ref 3.9–10.3)

## 2014-01-23 LAB — COMPREHENSIVE METABOLIC PANEL (CC13)
ALK PHOS: 66 U/L (ref 40–150)
ALT: 35 U/L (ref 0–55)
ANION GAP: 10 meq/L (ref 3–11)
AST: 32 U/L (ref 5–34)
Albumin: 3.4 g/dL — ABNORMAL LOW (ref 3.5–5.0)
BILIRUBIN TOTAL: 0.2 mg/dL (ref 0.20–1.20)
BUN: 6.9 mg/dL — ABNORMAL LOW (ref 7.0–26.0)
CO2: 23 meq/L (ref 22–29)
CREATININE: 0.7 mg/dL (ref 0.6–1.1)
Calcium: 8.7 mg/dL (ref 8.4–10.4)
Chloride: 104 mEq/L (ref 98–109)
Glucose: 216 mg/dl — ABNORMAL HIGH (ref 70–140)
Potassium: 4 mEq/L (ref 3.5–5.1)
SODIUM: 137 meq/L (ref 136–145)
Total Protein: 6.7 g/dL (ref 6.4–8.3)

## 2014-01-23 LAB — TECHNOLOGIST REVIEW

## 2014-01-23 MED ORDER — PROCHLORPERAZINE MALEATE 10 MG PO TABS
ORAL_TABLET | ORAL | Status: AC
  Filled 2014-01-23: qty 1

## 2014-01-23 MED ORDER — SODIUM CHLORIDE 0.9 % IJ SOLN
10.0000 mL | INTRAMUSCULAR | Status: DC | PRN
  Administered 2014-01-23: 10 mL
  Filled 2014-01-23: qty 10

## 2014-01-23 MED ORDER — SODIUM CHLORIDE 0.9 % IV SOLN
Freq: Once | INTRAVENOUS | Status: AC
  Administered 2014-01-23: 11:00:00 via INTRAVENOUS

## 2014-01-23 MED ORDER — SODIUM CHLORIDE 0.9 % IV SOLN
1000.0000 mg/m2 | Freq: Once | INTRAVENOUS | Status: AC
  Administered 2014-01-23: 2014 mg via INTRAVENOUS
  Filled 2014-01-23: qty 52.97

## 2014-01-23 MED ORDER — PROCHLORPERAZINE MALEATE 10 MG PO TABS
10.0000 mg | ORAL_TABLET | Freq: Once | ORAL | Status: AC
Start: 2014-01-23 — End: 2014-01-23
  Administered 2014-01-23: 10 mg via ORAL

## 2014-01-23 MED ORDER — HEPARIN SOD (PORK) LOCK FLUSH 100 UNIT/ML IV SOLN
500.0000 [IU] | Freq: Once | INTRAVENOUS | Status: AC | PRN
  Administered 2014-01-23: 500 [IU]
  Filled 2014-01-23: qty 5

## 2014-01-24 ENCOUNTER — Other Ambulatory Visit: Payer: Self-pay | Admitting: Internal Medicine

## 2014-01-24 DIAGNOSIS — C3490 Malignant neoplasm of unspecified part of unspecified bronchus or lung: Secondary | ICD-10-CM

## 2014-01-25 ENCOUNTER — Encounter (HOSPITAL_COMMUNITY): Payer: Self-pay | Admitting: Emergency Medicine

## 2014-01-25 ENCOUNTER — Other Ambulatory Visit: Payer: Self-pay

## 2014-01-25 ENCOUNTER — Inpatient Hospital Stay (HOSPITAL_COMMUNITY)
Admission: EM | Admit: 2014-01-25 | Discharge: 2014-01-28 | DRG: 864 | Disposition: A | Discharge status: Home / Self Care | Payer: Medicare Other | Attending: Internal Medicine | Admitting: Internal Medicine

## 2014-01-25 DIAGNOSIS — Z87891 Personal history of nicotine dependence: Secondary | ICD-10-CM

## 2014-01-25 DIAGNOSIS — R000 Tachycardia, unspecified: Secondary | ICD-10-CM

## 2014-01-25 DIAGNOSIS — F17200 Nicotine dependence, unspecified, uncomplicated: Secondary | ICD-10-CM

## 2014-01-25 DIAGNOSIS — Z794 Long term (current) use of insulin: Secondary | ICD-10-CM

## 2014-01-25 DIAGNOSIS — J449 Chronic obstructive pulmonary disease, unspecified: Secondary | ICD-10-CM | POA: Diagnosis present

## 2014-01-25 DIAGNOSIS — E785 Hyperlipidemia, unspecified: Secondary | ICD-10-CM

## 2014-01-25 DIAGNOSIS — J189 Pneumonia, unspecified organism: Secondary | ICD-10-CM

## 2014-01-25 DIAGNOSIS — E860 Dehydration: Secondary | ICD-10-CM

## 2014-01-25 DIAGNOSIS — E872 Acidosis, unspecified: Secondary | ICD-10-CM

## 2014-01-25 DIAGNOSIS — M810 Age-related osteoporosis without current pathological fracture: Secondary | ICD-10-CM

## 2014-01-25 DIAGNOSIS — D6859 Other primary thrombophilia: Secondary | ICD-10-CM | POA: Diagnosis present

## 2014-01-25 DIAGNOSIS — R110 Nausea: Secondary | ICD-10-CM | POA: Diagnosis present

## 2014-01-25 DIAGNOSIS — IMO0001 Reserved for inherently not codable concepts without codable children: Secondary | ICD-10-CM

## 2014-01-25 DIAGNOSIS — Z923 Personal history of irradiation: Secondary | ICD-10-CM

## 2014-01-25 DIAGNOSIS — E039 Hypothyroidism, unspecified: Secondary | ICD-10-CM

## 2014-01-25 DIAGNOSIS — Z833 Family history of diabetes mellitus: Secondary | ICD-10-CM

## 2014-01-25 DIAGNOSIS — Z7982 Long term (current) use of aspirin: Secondary | ICD-10-CM

## 2014-01-25 DIAGNOSIS — C3490 Malignant neoplasm of unspecified part of unspecified bronchus or lung: Secondary | ICD-10-CM

## 2014-01-25 DIAGNOSIS — R490 Dysphonia: Secondary | ICD-10-CM

## 2014-01-25 DIAGNOSIS — C3411 Malignant neoplasm of upper lobe, right bronchus or lung: Secondary | ICD-10-CM | POA: Diagnosis present

## 2014-01-25 DIAGNOSIS — Z882 Allergy status to sulfonamides status: Secondary | ICD-10-CM

## 2014-01-25 DIAGNOSIS — Z79899 Other long term (current) drug therapy: Secondary | ICD-10-CM

## 2014-01-25 DIAGNOSIS — E119 Type 2 diabetes mellitus without complications: Secondary | ICD-10-CM

## 2014-01-25 DIAGNOSIS — R5383 Other fatigue: Secondary | ICD-10-CM

## 2014-01-25 DIAGNOSIS — J4489 Other specified chronic obstructive pulmonary disease: Secondary | ICD-10-CM | POA: Diagnosis present

## 2014-01-25 DIAGNOSIS — Z880 Allergy status to penicillin: Secondary | ICD-10-CM

## 2014-01-25 DIAGNOSIS — J3489 Other specified disorders of nose and nasal sinuses: Secondary | ICD-10-CM

## 2014-01-25 DIAGNOSIS — I498 Other specified cardiac arrhythmias: Secondary | ICD-10-CM | POA: Diagnosis present

## 2014-01-25 DIAGNOSIS — F411 Generalized anxiety disorder: Secondary | ICD-10-CM

## 2014-01-25 DIAGNOSIS — R509 Fever, unspecified: Principal | ICD-10-CM

## 2014-01-25 DIAGNOSIS — M129 Arthropathy, unspecified: Secondary | ICD-10-CM | POA: Diagnosis present

## 2014-01-25 DIAGNOSIS — J441 Chronic obstructive pulmonary disease with (acute) exacerbation: Secondary | ICD-10-CM

## 2014-01-25 DIAGNOSIS — Z85118 Personal history of other malignant neoplasm of bronchus and lung: Secondary | ICD-10-CM

## 2014-01-25 DIAGNOSIS — Z800 Family history of malignant neoplasm of digestive organs: Secondary | ICD-10-CM

## 2014-01-25 DIAGNOSIS — R05 Cough: Secondary | ICD-10-CM | POA: Diagnosis present

## 2014-01-25 DIAGNOSIS — R059 Cough, unspecified: Secondary | ICD-10-CM | POA: Diagnosis present

## 2014-01-25 DIAGNOSIS — R6510 Systemic inflammatory response syndrome (SIRS) of non-infectious origin without acute organ dysfunction: Secondary | ICD-10-CM

## 2014-01-25 DIAGNOSIS — Z885 Allergy status to narcotic agent status: Secondary | ICD-10-CM

## 2014-01-25 NOTE — ED Notes (Signed)
Patient is alert and oriented x3.  She is complaining of generalized body aches.   She states that she is also on chemo but has never had any issues with feeling  This way.  Currently she rates her pain 4 of 10 with nausea

## 2014-01-25 NOTE — ED Notes (Signed)
Pt arrived to the Ed with a complaint of a fever that has been going on all day.  Pt states she also has been having chills, body ache, nausea.  Pt is in the final round of chemotherapy.

## 2014-01-26 ENCOUNTER — Emergency Department (HOSPITAL_COMMUNITY): Payer: Medicare Other

## 2014-01-26 ENCOUNTER — Encounter (HOSPITAL_COMMUNITY): Payer: Self-pay | Admitting: Radiology

## 2014-01-26 DIAGNOSIS — C3490 Malignant neoplasm of unspecified part of unspecified bronchus or lung: Secondary | ICD-10-CM

## 2014-01-26 DIAGNOSIS — R6510 Systemic inflammatory response syndrome (SIRS) of non-infectious origin without acute organ dysfunction: Secondary | ICD-10-CM | POA: Diagnosis present

## 2014-01-26 DIAGNOSIS — E860 Dehydration: Secondary | ICD-10-CM

## 2014-01-26 DIAGNOSIS — R509 Fever, unspecified: Principal | ICD-10-CM

## 2014-01-26 DIAGNOSIS — E119 Type 2 diabetes mellitus without complications: Secondary | ICD-10-CM

## 2014-01-26 LAB — CREATININE, SERUM
Creatinine, Ser: 0.75 mg/dL (ref 0.50–1.10)
GFR calc Af Amer: >90 mL/min (ref >90)
GFR calc non Af Amer: >90 mL/min (ref >90)

## 2014-01-26 LAB — CBC WITH DIFFERENTIAL/PLATELET
BASOS ABS: 0 10*3/uL (ref 0.0–0.1)
Basophils Relative: 0 % (ref 0–1)
EOS ABS: 0.1 10*3/uL (ref 0.0–0.7)
EOS PCT: 1 % (ref 0–5)
HCT: 34.1 % — ABNORMAL LOW (ref 36.0–46.0)
Hemoglobin: 11.4 g/dL — ABNORMAL LOW (ref 12.0–15.0)
Lymphocytes Relative: 7 % — ABNORMAL LOW (ref 12–46)
Lymphs Abs: 0.7 10*3/uL (ref 0.7–4.0)
MCH: 29.4 pg (ref 26.0–34.0)
MCHC: 33.4 g/dL (ref 30.0–36.0)
MCV: 87.9 fL (ref 78.0–100.0)
MONO ABS: 0.1 10*3/uL (ref 0.1–1.0)
Monocytes Relative: 1 % — ABNORMAL LOW (ref 3–12)
Neutro Abs: 9.7 10*3/uL — ABNORMAL HIGH (ref 1.7–7.7)
Neutrophils Relative %: 91 % — ABNORMAL HIGH (ref 43–77)
PLATELETS: 99 10*3/uL — AB (ref 150–400)
RBC: 3.88 MIL/uL (ref 3.87–5.11)
RDW: 17.9 % — ABNORMAL HIGH (ref 11.5–15.5)
WBC: 10.6 10*3/uL — ABNORMAL HIGH (ref 4.0–10.5)

## 2014-01-26 LAB — URINALYSIS, ROUTINE W REFLEX MICROSCOPIC
Bilirubin Urine: NEGATIVE
GLUCOSE, UA: NEGATIVE mg/dL
Hgb urine dipstick: NEGATIVE
Ketones, ur: NEGATIVE mg/dL
LEUKOCYTES UA: NEGATIVE
Nitrite: NEGATIVE
Protein, ur: NEGATIVE mg/dL
SPECIFIC GRAVITY, URINE: 1.013 (ref 1.005–1.030)
Urobilinogen, UA: 0.2 mg/dL (ref 0.0–1.0)
pH: 7 (ref 5.0–8.0)

## 2014-01-26 LAB — PROCALCITONIN: Procalcitonin: 0.21 ng/mL

## 2014-01-26 LAB — COMPREHENSIVE METABOLIC PANEL
ALBUMIN: 3.5 g/dL (ref 3.5–5.2)
ALT: 54 U/L — ABNORMAL HIGH (ref 0–35)
AST: 50 U/L — AB (ref 0–37)
Alkaline Phosphatase: 67 U/L (ref 39–117)
BUN: 10 mg/dL (ref 6–23)
CALCIUM: 9 mg/dL (ref 8.4–10.5)
CO2: 25 mEq/L (ref 19–32)
Chloride: 94 mEq/L — ABNORMAL LOW (ref 96–112)
Creatinine, Ser: 0.65 mg/dL (ref 0.50–1.10)
GFR calc Af Amer: >90 mL/min (ref >90)
GFR calc non Af Amer: >90 mL/min (ref >90)
Glucose, Bld: 196 mg/dL — ABNORMAL HIGH (ref 70–99)
Potassium: 4.1 mEq/L (ref 3.7–5.3)
SODIUM: 134 meq/L — AB (ref 137–147)
TOTAL PROTEIN: 7 g/dL (ref 6.0–8.3)
Total Bilirubin: 0.8 mg/dL (ref 0.3–1.2)

## 2014-01-26 LAB — CBC
HCT: 27.2 % — ABNORMAL LOW (ref 36.0–46.0)
Hemoglobin: 9.1 g/dL — ABNORMAL LOW (ref 12.0–15.0)
MCH: 29.7 pg (ref 26.0–34.0)
MCHC: 33.5 g/dL (ref 30.0–36.0)
MCV: 88.9 fL (ref 78.0–100.0)
PLATELETS: 95 10*3/uL — AB (ref 150–400)
RBC: 3.06 MIL/uL — AB (ref 3.87–5.11)
RDW: 17.8 % — ABNORMAL HIGH (ref 11.5–15.5)
WBC: 8.8 10*3/uL (ref 4.0–10.5)

## 2014-01-26 LAB — INFLUENZA PANEL BY PCR (TYPE A & B)
H1N1FLUPCR: NOT DETECTED
Influenza A By PCR: NEGATIVE
Influenza B By PCR: NEGATIVE

## 2014-01-26 LAB — HEMOGLOBIN A1C
Hgb A1c MFr Bld: 7.6 % — ABNORMAL HIGH (ref <5.7)
Mean Plasma Glucose: 171 mg/dL — ABNORMAL HIGH (ref <117)

## 2014-01-26 LAB — MRSA PCR SCREENING: MRSA BY PCR: NEGATIVE

## 2014-01-26 LAB — GLUCOSE, CAPILLARY
GLUCOSE-CAPILLARY: 241 mg/dL — AB (ref 70–99)
GLUCOSE-CAPILLARY: 268 mg/dL — AB (ref 70–99)

## 2014-01-26 LAB — I-STAT CG4 LACTIC ACID, ED: Lactic Acid, Venous: 1.56 mmol/L (ref 0.5–2.2)

## 2014-01-26 LAB — TROPONIN I
Troponin I: <0.3 ng/mL (ref <0.30)
Troponin I: <0.3 ng/mL (ref <0.30)
Troponin I: <0.3 ng/mL (ref <0.30)
Troponin I: <0.3 ng/mL (ref <0.30)

## 2014-01-26 MED ORDER — GUAIFENESIN ER 600 MG PO TB12
1200.0000 mg | ORAL_TABLET | Freq: Two times a day (BID) | ORAL | Status: DC
  Administered 2014-01-26 – 2014-01-28 (×5): 1200 mg via ORAL
  Filled 2014-01-26 (×6): qty 2

## 2014-01-26 MED ORDER — ACETAMINOPHEN 325 MG PO TABS: 650.0000 mg | ORAL_TABLET | Freq: Four times a day (QID) | ORAL | Status: DC | PRN

## 2014-01-26 MED ORDER — MAGNESIUM GLUCONATE 500 MG PO TABS
500.0000 mg | ORAL_TABLET | Freq: Every morning | ORAL | Status: DC
  Administered 2014-01-26 – 2014-01-27 (×2): 500 mg via ORAL
  Administered 2014-01-28: 10:00:00 via ORAL
  Filled 2014-01-26 (×3): qty 1

## 2014-01-26 MED ORDER — BUDESONIDE-FORMOTEROL FUMARATE 160-4.5 MCG/ACT IN AERO
2.0000 | INHALATION_SPRAY | Freq: Two times a day (BID) | RESPIRATORY_TRACT | Status: DC
  Administered 2014-01-26 – 2014-01-28 (×5): 2 via RESPIRATORY_TRACT
  Filled 2014-01-26: qty 6

## 2014-01-26 MED ORDER — BENZONATATE 100 MG PO CAPS
100.0000 mg | ORAL_CAPSULE | Freq: Three times a day (TID) | ORAL | Status: DC
  Administered 2014-01-26 – 2014-01-28 (×6): 100 mg via ORAL
  Filled 2014-01-26 (×10): qty 1

## 2014-01-26 MED ORDER — LEVOTHYROXINE SODIUM 125 MCG PO TABS
125.0000 ug | ORAL_TABLET | Freq: Every day | ORAL | Status: DC
  Administered 2014-01-26 – 2014-01-28 (×3): 125 ug via ORAL
  Filled 2014-01-26 (×4): qty 1

## 2014-01-26 MED ORDER — VARENICLINE TARTRATE 0.5 MG X 11 & 1 MG X 42 PO MISC: 1.0000 | Freq: Two times a day (BID) | ORAL | Status: DC

## 2014-01-26 MED ORDER — IOHEXOL 350 MG/ML SOLN
100.0000 mL | Freq: Once | INTRAVENOUS | Status: AC | PRN
  Administered 2014-01-26: 100 mL via INTRAVENOUS

## 2014-01-26 MED ORDER — ACETAMINOPHEN 650 MG RE SUPP: 650.0000 mg | Freq: Four times a day (QID) | RECTAL | Status: DC | PRN

## 2014-01-26 MED ORDER — DEXTROSE 5 % IV SOLN
2.0000 g | Freq: Two times a day (BID) | INTRAVENOUS | Status: DC
  Administered 2014-01-26 – 2014-01-27 (×3): 2 g via INTRAVENOUS
  Filled 2014-01-26 (×3): qty 2

## 2014-01-26 MED ORDER — OSELTAMIVIR PHOSPHATE 75 MG PO CAPS
75.0000 mg | ORAL_CAPSULE | Freq: Two times a day (BID) | ORAL | Status: DC
  Administered 2014-01-26 (×2): 75 mg via ORAL
  Filled 2014-01-26 (×4): qty 1

## 2014-01-26 MED ORDER — VANCOMYCIN HCL 10 G IV SOLR
1250.0000 mg | Freq: Once | INTRAVENOUS | Status: AC
  Administered 2014-01-26: 1250 mg via INTRAVENOUS
  Filled 2014-01-26: qty 1250

## 2014-01-26 MED ORDER — SODIUM CHLORIDE 0.9 % IV BOLUS (SEPSIS)
1000.0000 mL | Freq: Once | INTRAVENOUS | Status: AC
  Administered 2014-01-26: 1000 mL via INTRAVENOUS

## 2014-01-26 MED ORDER — SERTRALINE HCL 50 MG PO TABS
150.0000 mg | ORAL_TABLET | Freq: Every day | ORAL | Status: DC
  Administered 2014-01-26 – 2014-01-27 (×2): 150 mg via ORAL
  Filled 2014-01-26 (×4): qty 1

## 2014-01-26 MED ORDER — DOCUSATE SODIUM 100 MG PO CAPS
100.0000 mg | ORAL_CAPSULE | Freq: Two times a day (BID) | ORAL | Status: DC
  Administered 2014-01-26 – 2014-01-28 (×4): 100 mg via ORAL
  Filled 2014-01-26 (×6): qty 1

## 2014-01-26 MED ORDER — ACETAMINOPHEN 325 MG PO TABS
325.0000 mg | ORAL_TABLET | Freq: Once | ORAL | Status: AC
  Administered 2014-01-26: 325 mg via ORAL
  Filled 2014-01-26: qty 1

## 2014-01-26 MED ORDER — INSULIN ASPART 100 UNIT/ML ~~LOC~~ SOLN
0.0000 [IU] | Freq: Three times a day (TID) | SUBCUTANEOUS | Status: DC
  Administered 2014-01-26: 8 [IU] via SUBCUTANEOUS
  Administered 2014-01-26 – 2014-01-27 (×2): 5 [IU] via SUBCUTANEOUS
  Administered 2014-01-27 (×2): 3 [IU] via SUBCUTANEOUS
  Administered 2014-01-28: 5 [IU] via SUBCUTANEOUS

## 2014-01-26 MED ORDER — VARENICLINE TARTRATE 0.5 MG PO TABS
0.5000 mg | ORAL_TABLET | Freq: Two times a day (BID) | ORAL | Status: DC
  Administered 2014-01-26 – 2014-01-28 (×5): 0.5 mg via ORAL
  Filled 2014-01-26 (×6): qty 1

## 2014-01-26 MED ORDER — SPIRONOLACTONE 25 MG PO TABS
25.0000 mg | ORAL_TABLET | Freq: Every morning | ORAL | Status: DC
  Administered 2014-01-26 – 2014-01-28 (×3): 25 mg via ORAL
  Filled 2014-01-26 (×3): qty 1

## 2014-01-26 MED ORDER — ASPIRIN 325 MG PO TABS
325.0000 mg | ORAL_TABLET | Freq: Every morning | ORAL | Status: DC
  Administered 2014-01-26 – 2014-01-28 (×3): 325 mg via ORAL
  Filled 2014-01-26 (×3): qty 1

## 2014-01-26 MED ORDER — ALBUTEROL SULFATE (2.5 MG/3ML) 0.083% IN NEBU: 2.5000 mg | INHALATION_SOLUTION | Freq: Four times a day (QID) | RESPIRATORY_TRACT | Status: DC | PRN

## 2014-01-26 MED ORDER — VARENICLINE TARTRATE 1 MG PO TABS
1.0000 mg | ORAL_TABLET | Freq: Two times a day (BID) | ORAL | Status: DC
  Filled 2014-01-26: qty 1

## 2014-01-26 MED ORDER — IPRATROPIUM BROMIDE 0.02 % IN SOLN
0.5000 mg | Freq: Four times a day (QID) | RESPIRATORY_TRACT | Status: DC
  Administered 2014-01-26 – 2014-01-28 (×6): 0.5 mg via RESPIRATORY_TRACT
  Filled 2014-01-26 (×6): qty 2.5

## 2014-01-26 MED ORDER — ENOXAPARIN SODIUM 40 MG/0.4ML ~~LOC~~ SOLN
40.0000 mg | SUBCUTANEOUS | Status: DC
  Administered 2014-01-26 – 2014-01-28 (×3): 40 mg via SUBCUTANEOUS
  Filled 2014-01-26 (×3): qty 0.4

## 2014-01-26 MED ORDER — SODIUM CHLORIDE 0.9 % IJ SOLN
3.0000 mL | Freq: Two times a day (BID) | INTRAMUSCULAR | Status: DC
  Administered 2014-01-27: 3 mL via INTRAVENOUS

## 2014-01-26 MED ORDER — LORAZEPAM 1 MG PO TABS
1.0000 mg | ORAL_TABLET | Freq: Four times a day (QID) | ORAL | Status: DC | PRN
  Administered 2014-01-26: 1 mg via ORAL
  Filled 2014-01-26: qty 1

## 2014-01-26 MED ORDER — INSULIN ASPART 100 UNIT/ML ~~LOC~~ SOLN
0.0000 [IU] | Freq: Every day | SUBCUTANEOUS | Status: DC
  Administered 2014-01-27 (×2): 2 [IU] via SUBCUTANEOUS

## 2014-01-26 MED ORDER — HYDROCODONE-ACETAMINOPHEN 5-325 MG PO TABS
1.0000 | ORAL_TABLET | ORAL | Status: DC | PRN
  Administered 2014-01-26 (×2): 1 via ORAL
  Administered 2014-01-27 (×2): 2 via ORAL
  Administered 2014-01-27: 1 via ORAL
  Administered 2014-01-28: 2 via ORAL
  Filled 2014-01-26 (×2): qty 2
  Filled 2014-01-26: qty 1
  Filled 2014-01-26: qty 2
  Filled 2014-01-26: qty 1
  Filled 2014-01-26: qty 2

## 2014-01-26 MED ORDER — SODIUM CHLORIDE 0.9 % IV SOLN
INTRAVENOUS | Status: AC
  Administered 2014-01-26: 1000 mL via INTRAVENOUS
  Administered 2014-01-26: 09:00:00 via INTRAVENOUS

## 2014-01-26 MED ORDER — ONDANSETRON HCL 4 MG PO TABS
4.0000 mg | ORAL_TABLET | Freq: Four times a day (QID) | ORAL | Status: DC | PRN
  Administered 2014-01-27 – 2014-01-28 (×2): 4 mg via ORAL
  Filled 2014-01-26 (×2): qty 1

## 2014-01-26 MED ORDER — ONDANSETRON HCL 4 MG/2ML IJ SOLN: 4.0000 mg | Freq: Four times a day (QID) | INTRAMUSCULAR | Status: DC | PRN

## 2014-01-26 MED ORDER — VANCOMYCIN HCL IN DEXTROSE 1-5 GM/200ML-% IV SOLN
1000.0000 mg | Freq: Two times a day (BID) | INTRAVENOUS | Status: DC
  Administered 2014-01-26: 1000 mg via INTRAVENOUS
  Filled 2014-01-26 (×2): qty 200

## 2014-01-26 MED ORDER — ONDANSETRON HCL 4 MG/2ML IJ SOLN
4.0000 mg | Freq: Once | INTRAMUSCULAR | Status: AC
  Administered 2014-01-26: 4 mg via INTRAVENOUS
  Filled 2014-01-26: qty 2

## 2014-01-26 NOTE — ED Provider Notes (Signed)
CSN: [id number]     Arrival date & time 01/25/14  2154 History   First MD Initiated Contact with Patient 01/26/14 0003     Chief Complaint  Patient presents with  . Fever     (Consider location/radiation/quality/duration/timing/severity/associated sxs/prior Treatment) The history is provided by the patient. No language interpreter was used.  Janice Ball is a 58 year old female with past medical history of anxiety, COPD, arthritis, on the cell carcinoma of the lungs diagnosed in 05/2009 currently on chemotherapy-last chemotherapy session was on Wednesday, 01/23/2014-patient followed by Dr. Lorna Few. Patient reports that this afternoon at approximately 3:00 PM patient started to develop fever, chills, generalized bodyaches and headaches. Patient reported that her temperature orally was 102F. Stated that she's taking 2 Tylenol that have brought down her temperature. Reported that she has been feeling nauseous. Reported that she's been experiencing mild chest tightness to the center for chest with associated shortness of breath. Denied cough, dizziness, vomiting, diarrhea, abdominal pain, melena, hematochezia, hemoptysis, hematuria, hematemesis dysuria. PCP Dr. Toy Cookey Oncologist Dr. Lorna Few  Past Medical History  Diagnosis Date  . Anxiety   . Hyperlipidemia   . Hypothyroidism   . COPD (chronic obstructive pulmonary disease)   . Arthritis     thumbs  . 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 Lung cancer     lung ca dx 8/10  . Lung cancer   . Diabetes mellitus    Past Surgical History  Procedure Laterality Date  . Total abdominal hysterectomy    . Thyroidectomy, partial    . Tonsillectomy    . Tubal ligation     Family History  Problem Relation Age of Onset  . Colon cancer Mother   . Cancer Mother     colon   History  Substance Use Topics  . Smoking status: Former Smoker -- 1.00 packs/day for 45 years    Types: Cigarettes    Quit date:  03/07/2013  . Smokeless tobacco: Never Used     Comment: pt. started back smoking 10/18/2012, half a pack a day  . Alcohol Use: No   OB History   Grav Para Term Preterm Abortions TAB SAB Ect Mult Living                 Review of Systems  Constitutional: Positive for fever and chills.  Eyes: Negative for visual disturbance.  Respiratory: Positive for shortness of breath. Negative for chest tightness.   Cardiovascular: Positive for chest pain.  Gastrointestinal: Positive for nausea. Negative for vomiting and abdominal pain.  Musculoskeletal: Positive for myalgias.  Neurological: Positive for headaches. Negative for dizziness and weakness.  All other systems reviewed and are negative.     Allergies  Sulfonamide derivatives; Codeine; and Penicillins  Home Medications   Current Outpatient Rx  Name  Route  Sig  Dispense  Refill  . acetaminophen (TYLENOL) 325 MG tablet   Oral   Take 650 mg by mouth every 6 (six) hours as needed for mild pain or headache.          . albuterol (PROVENTIL HFA;VENTOLIN HFA) 108 (90 BASE) MCG/ACT inhaler   Inhalation   Inhale 2 puffs into the lungs every 6 (six) hours as needed for wheezing or shortness of breath.         Marland Kitchen albuterol (PROVENTIL) (2.5 MG/3ML) 0.083% nebulizer solution   Nebulization   Take 3 mLs (2.5 mg total) by nebulization every 6 (six) hours as needed for wheezing or  shortness of breath.   150 mL   0   . aspirin 325 MG tablet   Oral   Take 325 mg by mouth every morning.          . benzonatate (TESSALON) 100 MG capsule   Oral   Take 1 capsule (100 mg total) by mouth 3 (three) times daily.   20 capsule   0   . budesonide-formoterol (SYMBICORT) 160-4.5 MCG/ACT inhaler   Inhalation   Inhale 2 puffs into the lungs 2 (two) times daily.         . CHANTIX STARTING MONTH PAK 0.5 MG X 11 & 1 MG X 42 tablet   Oral   Take 0.5 mg by mouth 2 (two) times daily.          . cholecalciferol (VITAMIN D) 1000 UNITS tablet    Oral   Take 1,000 Units by mouth every morning.          . Chromium 1000 MCG TABS   Oral   Take 1 tablet by mouth every morning.          . cyanocobalamin 1000 MCG tablet   Oral   Take 1,500 mcg by mouth every morning.         Marland Kitchen glimepiride (AMARYL) 2 MG tablet   Oral   Take 2 mg by mouth daily with breakfast.          . guaiFENesin (MUCINEX) 600 MG 12 hr tablet   Oral   Take 1,200 mg by mouth 2 (two) times daily.         . insulin aspart (NOVOLOG) 100 UNIT/ML injection   Subcutaneous   Inject 0-20 Units into the skin 3 (three) times daily before meals.   10 mL   11     250.00.  Please dispense with 0.14ml insulin syring ...   . ipratropium (ATROVENT) 0.02 % nebulizer solution   Nebulization   Take 2.5 mLs (0.5 mg total) by nebulization 4 (four) times daily.   150 mL   0   . KRILL OIL PO   Oral   Take 1 tablet by mouth every morning.          Marland Kitchen levothyroxine (SYNTHROID, LEVOTHROID) 125 MCG tablet   Oral   Take 125 mcg by mouth daily before breakfast.          . lidocaine-prilocaine (EMLA) cream   Topical   Apply 1 application topically as needed (skin numbing).         . LORazepam (ATIVAN) 1 MG tablet   Oral   Take 1 mg by mouth 4 (four) times daily as needed for anxiety.         . magnesium gluconate (MAGONATE) 500 MG tablet   Oral   Take 500 mg by mouth every morning.          . metFORMIN (GLUCOPHAGE) 1000 MG tablet   Oral   Take 1,000 mg by mouth 2 (two) times daily with a meal.         . Multiple Vitamin (MULTIVITAMIN WITH MINERALS) TABS tablet   Oral   Take 1 tablet by mouth every morning.         . prochlorperazine (COMPAZINE) 10 MG tablet   Oral   Take 10 mg by mouth every 6 (six) hours as needed for nausea or vomiting.         . sertraline (ZOLOFT) 100 MG tablet   Oral   Take 150 mg  by mouth at bedtime. Takes 1 1/2 tabs daily for total dose of 150 mg         . spironolactone (ALDACTONE) 25 MG tablet   Oral    Take 25 mg by mouth every morning.           BP 141/70  Pulse 132  Temp(Src) 98.4 F (36.9 C) (Oral)  Resp 18  SpO2 95% Physical Exam  Nursing note and vitals reviewed. Constitutional: She is oriented to person, place, and time. She appears well-developed and well-nourished. No distress.  HENT:  Head: Normocephalic and atraumatic.  Mouth/Throat: Oropharynx is clear and moist. No oropharyngeal exudate.  Eyes: Conjunctivae and EOM are normal. Pupils are equal, round, and reactive to light. Right eye exhibits no discharge. Left eye exhibits no discharge.  Neck: Normal range of motion. Neck supple. No tracheal deviation present.  Negative neck stiffness Negative nuchal rigidity Negative cervical lymphadenopathy Negative meningeal signs  Cardiovascular: Regular rhythm and normal heart sounds.  Tachycardia present.   Pulses:      Radial pulses are 2+ on the right side, and 2+ on the left side.       Dorsalis pedis pulses are 2+ on the right side, and 2+ on the left side.  Negative swelling or pitting edema noted to the lower extremities bilaterally  Cap refill < 3 seconds  Pulmonary/Chest: Effort normal and breath sounds normal. No respiratory distress. She has no wheezes. She has no rales. She exhibits no tenderness.  Patient able to speak in full sentences without difficulty Negative stridor Negative use of accessory muscles  Musculoskeletal: Normal range of motion.  Full ROM to upper and lower extremities without difficulty noted, negative ataxia noted.  Lymphadenopathy:    She has no cervical adenopathy.  Neurological: She is alert and oriented to person, place, and time. No cranial nerve deficit. She exhibits normal muscle tone. Coordination normal.  Cranial nerves III-XII grossly intact Strength 5+/5+ to upper and lower extremities bilaterally with resistance applied, equal distribution noted  Skin: Skin is warm and dry. No rash noted. She is not diaphoretic. No erythema.   Psychiatric: She has a normal mood and affect. Her behavior is normal. Thought content normal.    ED Course  Procedures (including critical care time) Labs Review Labs Reviewed - No data to display Imaging Review No results found.   EKG Interpretation None      MDM   Final diagnoses:  None    Patient presenting to the ED with fever, generalized bodyaches, headache that started approximately 3:00 PM this afternoon. Patient reports that her fever was approximately 102F orally - has been using Tylenol for relief. Reported Center of the chest tightness with associated shortness of breath. Patient has history of squamous cell lung carcinoma currently going to chemotherapy, last session 01/23/2014. This provider reviewed patient's chart. Patient was seen and assessed in ED setting of 12/28/2013 regarding fevers ranging from 101-102F. Patient was also found to be tachycardic during this time with a heart rate ranging in the 130s. Patient was admitted to hospital. Alert and oriented. GCS 15. Heart rate noted to be tachycardic, normal rhythm. Cap refill less than 3 seconds. Radial and DP pulses 2+ bilaterally. Lungs noted to be clear to auscultation-negative rhonchi or rales noted. Good lung expansion. Negative use of accessory muscles. Patient able to speak in full sentences without difficulty. Negative stridor. Negative swelling or pitting edema identified to lower extremities bilaterally. Chest xray negative for acute cardiopulmonary disease.  Plan:  Labs to be drawn basic, blood cultures, urine cultures. Since patient is a cancer patient she is in a hypercoagulable state with tachycardia and chest pain, history of lung cancer - CT angio of chest to be performed. Patient will need to be admitted. Discussed case with Junius Creamer, NP at change in shift. Transfer of care to Junius Creamer, NP at change in shift.   Jamse Mead, PA-C 01/26/14 1201

## 2014-01-26 NOTE — ED Provider Notes (Signed)
Medical screening examination/treatment/procedure(s) were conducted as a shared visit with non-physician practitioner(s) and myself.  I personally evaluated the patient during the encounter.   EKG Interpretation None      Admit to medicine for fever in setting of recent chemo. No neutropenia at this time  Hoy Morn, MD 01/26/14 [phone]

## 2014-01-26 NOTE — ED Provider Notes (Signed)
This is a 86 old female with a history of lung cancer, who is 5 days status post chemotherapy, who presents with fever.  Evaluation, as she has a moderately elevated white count.  CTA of the chest was performed due to high risk.  Negative for PE.  Patient will be admitted to monitor fevers and white count  Garald Balding, NP 01/26/14 [phone]

## 2014-01-26 NOTE — Progress Notes (Signed)
Patient seen and examined, database reviewed. Has lung cancer and 2 days after her last chemo session developed fevers, chills. She is not neutropenic. Source remains unclear. Continue broad-spectrum antibiotics for now. Tamiflu pending flu PCR results. Will continue to follow.  Domingo Mend, MD [facility] Pager: [phone]

## 2014-01-26 NOTE — H&P (Signed)
PCP: [name], NP    Chief Complaint:  fever  HPI: Janice Ball is a 58 y.o. female   has a past medical history of Anxiety; Hyperlipidemia; Hypothyroidism; COPD (chronic obstructive pulmonary disease); Arthritis; Radiation (06/30/09-08/15/09); Diabetes mellitus; Lung cancer (dx'd 05/2009); and Lung cancer (dx'd 09/2013).   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 She have had chemo and radiation therapy. currently on 5 cycle of gemzar. Patient had her last treatment on 4/08. In about 24-48 hours after infusion she developed myalgia's and fever up to 102. Patient presented to ER and was found to be tachycardia up to 120's.. She endorsed being short of breath. CTA of the chest did not show PE  And no acute infiltrates. Patient denies any other localizing complaints. She had some nausea. No chest pain, but mild tightness. She had some mild cough no urinary complaints. hospitalist was called for admission. Of note patient have been recently hospitalized for CAP.  Review of Systems:     Pertinent positives include:Fevers, chills, fatigue, myalgia, nausea, shortness of breath at rest. non-productive cough,   Constitutional:  No weight loss, night sweats,  weight loss  HEENT:  No headaches, Difficulty swallowing,Tooth/dental problems,Sore throat,  No sneezing, itching, ear ache, nasal congestion, post nasal drip,  Cardio-vascular:  No chest pain, Orthopnea, PND, anasarca, dizziness, palpitations.no Bilateral lower extremity swelling  GI:  No heartburn, indigestion, abdominal pain, vomiting, diarrhea, change in bowel habits, loss of appetite, melena, blood in stool, hematemesis Resp:  no No dyspnea on exertion, No excess mucus, no productive cough, No No coughing up of blood.No change in color of mucus.No wheezing. Skin:  no rash or lesions. No jaundice GU:  no dysuria, change in color of urine, no urgency or frequency. No straining to urinate.   No flank pain.  Musculoskeletal:  No joint pain or no joint swelling. No decreased range of motion. No back pain.  Psych:  No change in mood or affect. No depression or anxiety. No memory loss.  Neuro: no localizing neurological complaints, no tingling, no weakness, no double vision, no gait abnormality, no slurred speech, no confusion  Otherwise ROS are negative except for above, 10 systems were reviewed  Past Medical History: Past Medical History  Diagnosis Date  . Anxiety   . Hyperlipidemia   . Hypothyroidism   . COPD (chronic obstructive pulmonary disease)   . Arthritis     thumbs  . 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 Diabetes mellitus   . Lung cancer dx'd 05/2009  . Lung cancer dx'd 09/2013    recurrence in LN   Past Surgical History  Procedure Laterality Date  . Total abdominal hysterectomy    . Thyroidectomy, partial    . Tonsillectomy    . Tubal ligation       Medications: Prior to Admission medications   Medication Sig Start Date End Date Taking? Authorizing Provider  acetaminophen (TYLENOL) 325 MG tablet Take 650 mg by mouth every 6 (six) hours as needed for mild pain or headache.    Yes Historical Provider, MD  albuterol (PROVENTIL HFA;VENTOLIN HFA) 108 (90 BASE) MCG/ACT inhaler Inhale 2 puffs into the lungs every 6 (six) hours as needed for wheezing or shortness of breath.   Yes Historical Provider, MD  albuterol (PROVENTIL) (2.5 MG/3ML) 0.083% nebulizer solution Take 3 mLs (2.5 mg total) by nebulization every 6 (six) hours as needed for wheezing or shortness of breath. 01/01/14  Yes Janece Canterbury, MD  aspirin 325 MG tablet Take 325 mg by mouth every morning.    Yes Historical Provider, MD  benzonatate (TESSALON) 100 MG capsule Take 1 capsule (100 mg total) by mouth 3 (three) times daily. 01/01/14  Yes Janece Canterbury, MD  budesonide-formoterol [facility]) 160-4.5 MCG/ACT inhaler Inhale 2 puffs into the lungs 2 (two) times daily.   Yes  Historical Provider, MD  CHANTIX STARTING MONTH PAK 0.5 MG X 11 & 1 MG X 42 tablet Take 0.5 mg by mouth 2 (two) times daily.  01/08/14  Yes Historical Provider, MD  cholecalciferol (VITAMIN D) 1000 UNITS tablet Take 1,000 Units by mouth every morning.    Yes Historical Provider, MD  Chromium 1000 MCG TABS Take 1 tablet by mouth every morning.    Yes Historical Provider, MD  cyanocobalamin 1000 MCG tablet Take 1,500 mcg by mouth every morning.   Yes Historical Provider, MD  glimepiride (AMARYL) 2 MG tablet Take 2 mg by mouth daily with breakfast.  12/21/13  Yes Historical Provider, MD  guaiFENesin (MUCINEX) 600 MG 12 hr tablet Take 1,200 mg by mouth 2 (two) times daily.   Yes Historical Provider, MD  insulin aspart (NOVOLOG) 100 UNIT/ML injection Inject 0-20 Units into the skin 3 (three) times daily before meals. 01/01/14  Yes Janece Canterbury, MD  ipratropium (ATROVENT) 0.02 % nebulizer solution Take 2.5 mLs (0.5 mg total) by nebulization 4 (four) times daily. 01/01/14  Yes Janece Canterbury, MD  KRILL OIL PO Take 1 tablet by mouth every morning.    Yes Historical Provider, MD  levothyroxine (SYNTHROID, LEVOTHROID) 125 MCG tablet Take 125 mcg by mouth daily before breakfast.    Yes Historical Provider, MD  lidocaine-prilocaine (EMLA) cream Apply 1 application topically as needed (skin numbing).   Yes Historical Provider, MD  LORazepam (ATIVAN) 1 MG tablet Take 1 mg by mouth 4 (four) times daily as needed for anxiety.   Yes Historical Provider, MD  magnesium gluconate (MAGONATE) 500 MG tablet Take 500 mg by mouth every morning.    Yes Historical Provider, MD  metFORMIN (GLUCOPHAGE) 1000 MG tablet Take 1,000 mg by mouth 2 (two) times daily with a meal.   Yes Historical Provider, MD  Multiple Vitamin (MULTIVITAMIN WITH MINERALS) TABS tablet Take 1 tablet by mouth every morning.   Yes Historical Provider, MD  prochlorperazine (COMPAZINE) 10 MG tablet Take 10 mg by mouth every 6 (six) hours as needed for nausea  or vomiting.   Yes Historical Provider, MD  sertraline (ZOLOFT) 100 MG tablet Take 150 mg by mouth at bedtime. Takes 1 1/2 tabs daily for total dose of 150 mg   Yes Historical Provider, MD  spironolactone (ALDACTONE) 25 MG tablet Take 25 mg by mouth every morning.    Yes Historical Provider, MD    Allergies:   Allergies  Allergen Reactions  . Sulfonamide Derivatives Rash  . Codeine Other (See Comments)    hallucinations  . Penicillins Other (See Comments)    Childhood allergy; reaction unknown    Social History:  Ambulatory  independently   Lives at  Home with daughter   reports that she quit smoking about 10 months ago. Her smoking use included Cigarettes. She has a 45 pack-year smoking history. She has never used smokeless tobacco. She reports that she does not drink alcohol or use illicit drugs.   Family History: family history includes Cancer in her mother; Colon cancer in her mother; Diabetes type II in her father.  Physical Exam: Patient Vitals for the past 24 hrs:  BP Temp Temp src Pulse Resp SpO2  01/26/14 0439 133/60 mmHg - - 123 18 93 %  01/26/14 0219 139/68 mmHg - - 127 - 95 %  01/26/14 0218 139/68 mmHg 101.8 F (38.8 C) Rectal 126 18 95 %  01/26/14 0150 137/65 mmHg - - 125 - 95 %  01/25/14 2316 141/70 mmHg 98.4 F (36.9 C) Oral 132 18 95 %    1. General:  in No Acute distress 2. Psychological: Alert and   Oriented 3. Head/ENT:     Dry Mucous Membranes                          Head Non traumatic, neck supple                          Normal  Dentition 4. SKIN:   decreased Skin turgor,  Skin clean Dry and intact no rash 5. Heart: Regular rate and rhythm no Murmur, Rub or gallop 6. Lungs: ocasional wheezes no crackles   7. Abdomen: Soft, non-tender, Non distended obese 8. Lower extremities: no clubbing, cyanosis, or edema 9. Neurologically Grossly intact, moving all 4 extremities equally 10. MSK: Normal range of motion  body mass index is unknown because  there is no weight on file.   Labs on Admission:   Recent Labs  01/23/14 1012 01/26/14 0011  NA 137 134*  K 4.0 4.1  CL  --  94*  CO2 23 25  GLUCOSE 216* 196*  BUN 6.9* 10  CREATININE 0.7 0.65  CALCIUM 8.7 9.0    Recent Labs  01/23/14 1012 01/26/14 0011  AST 32 50*  ALT 35 54*  ALKPHOS 66 67  BILITOT 0.20 0.8  PROT 6.7 7.0  ALBUMIN 3.4* 3.5   No results found for this basename: LIPASE, AMYLASE,  in the last 72 hours  Recent Labs  01/23/14 1011 01/26/14 0011  WBC 3.1* 10.6*  NEUTROABS 1.7 9.7*  HGB 11.0* 11.4*  HCT 32.9* 34.1*  MCV 88.4 87.9  PLT 139* 99*    Recent Labs  01/26/14 0114  TROPONINI <0.30   No results found for this basename: TSH, T4TOTAL, FREET3, T3FREE, THYROIDAB,  in the last 72 hours No results found for this basename: VITAMINB12, FOLATE, FERRITIN, TIBC, IRON, RETICCTPCT,  in the last 72 hours Lab Results  Component Value Date   HGBA1C 7.3* 12/29/2013    The CrCl is unknown because both a height and weight (above a minimum accepted value) are required for this calculation. ABG    Component Value Date/Time   TCO2 23 07/14/2012 1048     No results found for this basename: DDIMER     Other results:  I have pearsonaly reviewed this: ECG REPORT  Rate: 128  Rhythm: sinus tachycardia ST&T Change: no ischemic changes  UA no evidence of infection    Cultures:    Component Value Date/Time   SDES URINE, CLEAN CATCH 12/28/2013 2252   SPECREQUEST NONE 12/28/2013 2252   CULT  Value: NO GROWTH Performed at [facility] 12/28/2013 2252   REPTSTATUS 12/30/2013 FINAL 12/28/2013 2252       Radiological Exams on Admission: Dg Chest 2 View  01/26/2014   CLINICAL DATA:  Fever, shortness of breath, history of COPD, lung cancer on chemotherapy.  EXAM: CHEST  2 VIEW  COMPARISON:  12/28/2013  FINDINGS: Power port type  central venous catheter is unchanged in position. Heart size and pulmonary vascularity are normal. Scarring and superior  retraction in the right hilum likely representing postradiation change. Focal nodular density in the right mid lung laterally likely corresponds to rib fracture on previous CT. No focal airspace disease or consolidation. No pneumothorax.  IMPRESSION: Unchanged appearance of the chest since previous study consistent with history of previous lung cancer. No evidence of active pulmonary disease.   Electronically Signed   By: Lucienne Capers M.D.   On: 01/26/2014 01:41   Ct Angio Chest Pe W/cm &/or Wo Cm  01/26/2014   CLINICAL DATA:  Tachycardia. Chest pain. Body aches. Nausea. Ongoing chemotherapy for metastatic lung cancer.  EXAM: CT ANGIOGRAPHY CHEST WITH CONTRAST  TECHNIQUE: Multidetector CT imaging of the chest was performed using the standard protocol during bolus administration of intravenous contrast. Multiplanar CT image reconstructions and MIPs were obtained to evaluate the vascular anatomy.  CONTRAST:  169mL OMNIPAQUE IOHEXOL 350 MG/ML SOLN  COMPARISON:  12/29/2013  FINDINGS: Technically adequate study with good opacification of the central and segmental pulmonary arteries. No focal filling defects demonstrated. No evidence of significant pulmonary embolus. Again demonstrated is a right hilar mass with scarring in the right hilum and right upper lung consistent with postradiation therapy changes. The mass and infiltration appear similar to previous study. Subcarinal and right paratracheal lymphadenopathy is similar to previous study. No developing consolidation in the lungs. Visualized upper abdominal organs are unremarkable. Old right rib fractures. No destructive bone lesions appreciated. Thoracic scoliosis convex towards the left.  Review of the MIP images confirms the above findings.  IMPRESSION: No evidence of significant pulmonary embolus. Unchanged appearance of mass and radiation changes in the right hilum and right upper lung. Prominent right paratracheal and subcarinal lymph nodes. No  significant changes since prior study.   Electronically Signed   By: Lucienne Capers M.D.   On: 01/26/2014 04:19    Chart has been reviewed  Assessment/Plan 58 yo female with TCO squamous cell lung cancer here with SIRS.   Present on Admission:  SIRS - no admit to step down. His IV antibiotics and IV fluids. Obtain lactic acid and procalcitonin . CARCINOMA, LUNG, SQUAMOUS CELL - would alert Dr. Earlie Server about patient being here in AM . DIABETES MELLITUS, TYPE II - sliding scale will hold metformin and by mouth meds  . Febrile illness, acute - given myalgias with evaluate for viral illness including influenza and empricaly treat with tamiflu . Sinus tachycardia - patient has history of sinus tachycardia in the past. Appears to be somewhat dehydrated we'll administer IV fluids  . Dehydration - IV fluids   Prophylaxis:   Lovenox, Protonix  CODE STATUS:FULL CODE  Other plan as per orders.  I have spent a total of 55 min on this admission  Horace Lukas 01/26/2014, 6:21 AM

## 2014-01-26 NOTE — Progress Notes (Signed)
ANTIBIOTIC CONSULT NOTE - INITIAL  Pharmacy Consult for Vancomycin and Cefepime Indication: rule out sepsis  Allergies  Allergen Reactions  . Sulfonamide Derivatives Rash  . Codeine Other (See Comments)    hallucinations  . Penicillins Other (See Comments)    Childhood allergy; reaction unknown    Patient Measurements: Height: 5\' 8"  (172.7 cm) Weight: 190 lb 0.6 oz (86.2 kg) IBW/kg (Calculated) : 63.9  Vital Signs: Temp: 98.4 F (36.9 C) (04/11 0900) Temp src: Oral (04/11 0900) BP: 108/55 mmHg (04/11 0726) Pulse Rate: 120 (04/11 0726) Intake/Output from previous day:   Intake/Output from this shift:    Labs:  Recent Labs  01/23/14 1011 01/23/14 1012 01/26/14 0011  WBC 3.1*  --  10.6*  HGB 11.0*  --  11.4*  PLT 139*  --  99*  CREATININE  --  0.7 0.65   Estimated Creatinine Clearance: 87 ml/min (by C-G formula based on Cr of 0.65).  76 ml/min rounding SCr up to 0.8 No results found for this basename: VANCOTROUGH, VANCOPEAK, VANCORANDOM, GENTTROUGH, GENTPEAK, GENTRANDOM, TOBRATROUGH, TOBRAPEAK, TOBRARND, AMIKACINPEAK, AMIKACINTROU, AMIKACIN,  in the last 72 hours   Microbiology: Recent Results (from the past 720 hour(s))  CULTURE, BLOOD (ROUTINE X 2)     Status: None   Collection Time    12/28/13 10:22 PM      Result Value Ref Range Status   Specimen Description BLOOD LEFT ARM   Final   Special Requests BOTTLES DRAWN AEROBIC AND ANAEROBIC 5CC   Final   Culture  Setup Time     Final   Value: 12/29/2013 00:46     Performed at [facility]   Culture     Final   Value: NO GROWTH 5 DAYS     Performed at [facility]   Report Status 01/04/2014 FINAL   Final  CULTURE, BLOOD (ROUTINE X 2)     Status: None   Collection Time    12/28/13 10:40 PM      Result Value Ref Range Status   Specimen Description BLOOD RIGHT ARM   Final   Special Requests BOTTLES DRAWN AEROBIC AND ANAEROBIC 5CC   Final   Culture  Setup Time     Final   Value: 12/29/2013  00:46     Performed at [facility]   Culture     Final   Value: NO GROWTH 5 DAYS     Performed at [facility]   Report Status 01/04/2014 FINAL   Final  URINE CULTURE     Status: None   Collection Time    12/28/13 10:52 PM      Result Value Ref Range Status   Specimen Description URINE, CLEAN CATCH   Final   Special Requests NONE   Final   Culture  Setup Time     Final   Value: 12/29/2013 04:17     Performed at [place]     Final   Value: NO GROWTH     Performed at [facility]   Culture     Final   Value: NO GROWTH     Performed at [facility]   Report Status 12/30/2013 FINAL   Final  TECHNOLOGIST REVIEW     Status: None   Collection Time    01/23/14 10:11 AM      Result Value Ref Range Status   Technologist Review Rare Meta and Myelocyte present   Final  Medical History: Past Medical History  Diagnosis Date  . Anxiety   . Hyperlipidemia   . Hypothyroidism   . COPD (chronic obstructive pulmonary disease)   . Arthritis     thumbs  . 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 Diabetes mellitus   . Lung cancer dx'd 05/2009  . Lung cancer dx'd 09/2013    recurrence in LN    Medications:  Scheduled:  . aspirin  325 mg Oral q morning - 10a  . benzonatate  100 mg Oral TID  . budesonide-formoterol  2 puff Inhalation BID  . ceFEPime (MAXIPIME) IV  2 g Intravenous Q12H  . docusate sodium  100 mg Oral BID  . enoxaparin (LOVENOX) injection  40 mg Subcutaneous Q24H  . guaiFENesin  1,200 mg Oral BID  . insulin aspart  0-15 Units Subcutaneous TID WC  . insulin aspart  0-5 Units Subcutaneous QHS  . ipratropium  0.5 mg Nebulization QID  . levothyroxine  125 mcg Oral QAC breakfast  . magnesium gluconate  500 mg Oral q morning - 10a  . oseltamivir  75 mg Oral BID  . sertraline  150 mg Oral QHS  . sodium chloride  3 mL Intravenous Q12H  . spironolactone  25 mg Oral q morning - 10a  . vancomycin  1,250  mg Intravenous Once  . vancomycin  1,000 mg Intravenous Q12H  . varenicline  1 tablet Oral BID   Infusions:  . sodium chloride     Assessment: 58 yo female with squamous cell lung cancer on chemo, last treatment 4/8. Presents 4/11 with myalgias and fever 102. Starting broad spectrum abx with vancomycin and cefepime for sepsis.  4/11 >> Vanc >> 4/11 >> Cefepime >>  Tmax: 102 (per pt) WBC: 10.6 Renal: SCr 0.65, CrCl 76CG, 85N using 0.8  Cultures: 4/11 blood x2: 4/11 urine:  Goal of Therapy:  Vancomycin trough level 15-20 mcg/ml Cefepime dose per renal function  Plan:   Vancomycin 1250mg  IV x 1 now, then 1g IV q12h Check trough at steady state Cefepime 2g IV q12h Follow up renal function & cultures, clinical course  Peggyann Juba, PharmD, BCPS Pager: [phone] 01/26/2014,9:14 AM

## 2014-01-26 NOTE — ED Provider Notes (Signed)
Medical screening examination/treatment/procedure(s) were performed by non-physician practitioner and as supervising physician I was immediately available for consultation/collaboration.   EKG Interpretation None        Hoy Morn, MD 01/26/14 [phone]

## 2014-01-27 DIAGNOSIS — J189 Pneumonia, unspecified organism: Secondary | ICD-10-CM

## 2014-01-27 LAB — COMPREHENSIVE METABOLIC PANEL
ALT: 34 U/L (ref 0–35)
AST: 22 U/L (ref 0–37)
Albumin: 2.9 g/dL — ABNORMAL LOW (ref 3.5–5.2)
Alkaline Phosphatase: 56 U/L (ref 39–117)
BUN: 9 mg/dL (ref 6–23)
CO2: 26 mEq/L (ref 19–32)
CREATININE: 0.6 mg/dL (ref 0.50–1.10)
Calcium: 8.1 mg/dL — ABNORMAL LOW (ref 8.4–10.5)
Chloride: 102 mEq/L (ref 96–112)
GFR calc Af Amer: >90 mL/min (ref >90)
GFR calc non Af Amer: >90 mL/min (ref >90)
Glucose, Bld: 160 mg/dL — ABNORMAL HIGH (ref 70–99)
Potassium: 4 mEq/L (ref 3.7–5.3)
Sodium: 138 mEq/L (ref 137–147)
Total Bilirubin: 0.3 mg/dL (ref 0.3–1.2)
Total Protein: 6.1 g/dL (ref 6.0–8.3)

## 2014-01-27 LAB — DIFFERENTIAL
Basophils Absolute: 0 10*3/uL (ref 0.0–0.1)
Basophils Relative: 0 % (ref 0–1)
EOS ABS: 0.2 10*3/uL (ref 0.0–0.7)
Eosinophils Relative: 2 % (ref 0–5)
LYMPHS ABS: 0.7 10*3/uL (ref 0.7–4.0)
Lymphocytes Relative: 11 % — ABNORMAL LOW (ref 12–46)
Monocytes Absolute: 0 10*3/uL — ABNORMAL LOW (ref 0.1–1.0)
Monocytes Relative: 0 % — ABNORMAL LOW (ref 3–12)
NEUTROS PCT: 87 % — AB (ref 43–77)
Neutro Abs: 5.5 10*3/uL (ref 1.7–7.7)

## 2014-01-27 LAB — GLUCOSE, CAPILLARY
GLUCOSE-CAPILLARY: 207 mg/dL — AB (ref 70–99)
Glucose-Capillary: 244 mg/dL — ABNORMAL HIGH (ref 70–99)
Glucose-Capillary: 182 mg/dL — ABNORMAL HIGH (ref 70–99)
Glucose-Capillary: 214 mg/dL — ABNORMAL HIGH (ref 70–99)
Glucose-Capillary: 186 mg/dL — ABNORMAL HIGH (ref 70–99)

## 2014-01-27 LAB — CBC
HCT: 27.3 % — ABNORMAL LOW (ref 36.0–46.0)
HEMOGLOBIN: 9.1 g/dL — AB (ref 12.0–15.0)
MCH: 29.9 pg (ref 26.0–34.0)
MCHC: 33.3 g/dL (ref 30.0–36.0)
MCV: 89.8 fL (ref 78.0–100.0)
Platelets: 107 10*3/uL — ABNORMAL LOW (ref 150–400)
RBC: 3.04 MIL/uL — AB (ref 3.87–5.11)
RDW: 17.9 % — ABNORMAL HIGH (ref 11.5–15.5)
WBC: 6.4 10*3/uL (ref 4.0–10.5)

## 2014-01-27 LAB — MAGNESIUM: Magnesium: 1.8 mg/dL (ref 1.5–2.5)

## 2014-01-27 LAB — CULTURE, BLOOD (ROUTINE X 2)

## 2014-01-27 LAB — PHOSPHORUS: PHOSPHORUS: 4.3 mg/dL (ref 2.3–4.6)

## 2014-01-27 LAB — URINE CULTURE
COLONY COUNT: NO GROWTH
Culture: NO GROWTH

## 2014-01-27 LAB — TSH: TSH: 17.16 u[IU]/mL — AB (ref 0.350–4.500)

## 2014-01-27 MED ORDER — BIOTENE DRY MOUTH MT LIQD
15.0000 mL | Freq: Two times a day (BID) | OROMUCOSAL | Status: DC
  Administered 2014-01-27 – 2014-01-28 (×2): 15 mL via OROMUCOSAL

## 2014-01-27 MED ORDER — VANCOMYCIN HCL IN DEXTROSE 1-5 GM/200ML-% IV SOLN
1000.0000 mg | Freq: Three times a day (TID) | INTRAVENOUS | Status: DC
  Administered 2014-01-27: 1000 mg via INTRAVENOUS
  Filled 2014-01-27: qty 200

## 2014-01-27 NOTE — Progress Notes (Signed)
[facility] PROGRESS NOTE  Janice Ball DVV:[id number] DOB: 1956-04-21 DOA: 01/25/2014 PCP: [name], NP  Assessment/Plan: Acute Febrile Illness -Etiology remains unclear. -Not neutropenic. -Urine/Blood cx remain negative. -Flu PCR negative (DC tamiflu). -Will stop broad spectrum abx to see if fever recurs, in which case may need to consider port removal.  Lung Cancer -Will alert Dr. [name] via [place].  DM -Follow CBGs on SSI.  SIRS -Resolved.   Code Status: Full Family Communication: Patient only  Disposition Plan: Home when ready   Consultants:  None   Antibiotics:  None   Subjective: Feels well other than leg cramps occasionally  Objective: Filed Vitals:   01/27/14 0740 01/27/14 0800 01/27/14 1200 01/27/14 1505  BP:  103/66 125/61   Pulse:  89 97   Temp:  97.7 F (36.5 C) 98.3 F (36.8 C)   TempSrc:  Oral Oral   Resp:  18 16   Height:      Weight:      SpO2: 98% 96%  99%    Intake/Output Summary (Last 24 hours) at 01/27/14 1516 Last data filed at 01/27/14 1300  Gross per 24 hour  Intake   1070 ml  Output   2100 ml  Net  -1030 ml   Filed Weights   01/26/14 0900 01/27/14 0400  Weight: 86.2 kg (190 lb 0.6 oz) 86 kg (189 lb 9.5 oz)    Exam:   General:  AA Ox3  Cardiovascular: RRR  Respiratory: CTA B  Abdomen: S/NT/ND/+BS  Extremities: no C/C/E   Neurologic:  Non-focal.  Data Reviewed: Basic Metabolic Panel:  Recent Labs Lab 01/23/14 1012 01/26/14 0011 01/26/14 1050 01/27/14 0421  NA 137 134*  --  138  K 4.0 4.1  --  4.0  CL  --  94*  --  102  CO2 23 25  --  26  GLUCOSE 216* 196*  --  160*  BUN 6.9* 10  --  9  CREATININE 0.7 0.65 0.75 0.60  CALCIUM 8.7 9.0  --  8.1*  MG  --   --   --  1.8  PHOS  --   --   --  4.3   Liver Function Tests:  Recent Labs Lab 01/23/14 1012 01/26/14 0011 01/27/14 0421  AST 32 50* 22  ALT 35 54* 34  ALKPHOS 66 67 56  BILITOT 0.20 0.8 0.3  PROT 6.7 7.0 6.1  ALBUMIN  3.4* 3.5 2.9*   No results found for this basename: LIPASE, AMYLASE,  in the last 168 hours No results found for this basename: AMMONIA,  in the last 168 hours CBC:  Recent Labs Lab 01/23/14 1011 01/26/14 0011 01/26/14 1050 01/27/14 0421  WBC 3.1* 10.6* 8.8 6.4  NEUTROABS 1.7 9.7*  --  5.5  HGB 11.0* 11.4* 9.1* 9.1*  HCT 32.9* 34.1* 27.2* 27.3*  MCV 88.4 87.9 88.9 89.8  PLT 139* 99* 95* 107*   Cardiac Enzymes:  Recent Labs Lab 01/26/14 0114 01/26/14 1050 01/26/14 1600 01/26/14 2250  TROPONINI <0.30 <0.30 <0.30 <0.30   BNP (last 3 results) No results found for this basename: PROBNP,  in the last 8760 hours CBG:  Recent Labs Lab 01/26/14 1220 01/26/14 1543 01/27/14 0027 01/27/14 0724 01/27/14 1259  GLUCAP 241* 268* 244* 182* 214*    Recent Results (from the past 240 hour(s))  TECHNOLOGIST REVIEW     Status: None   Collection Time    01/23/14 10:11 AM      Result Value  Ref Range Status   Technologist Review Rare Meta and Myelocyte present   Final  CULTURE, BLOOD (ROUTINE X 2)     Status: None   Collection Time    01/26/14  1:40 AM      Result Value Ref Range Status   Specimen Description BLOOD RIGHT CHEST PORT   Final   Special Requests BOTTLES DRAWN AEROBIC AND ANAEROBIC 5CC   Final   Culture  Setup Time     Final   Value: 01/26/2014 13:31     Performed at [facility]   Culture     Final   Value:        BLOOD CULTURE RECEIVED NO GROWTH TO DATE CULTURE WILL BE HELD FOR 5 DAYS BEFORE ISSUING A FINAL NEGATIVE REPORT     Performed at [facility]   Report Status PENDING   Incomplete  CULTURE, BLOOD (ROUTINE X 2)     Status: None   Collection Time    01/26/14  1:40 AM      Result Value Ref Range Status   Specimen Description BLOOD LEFT ARM   Final   Special Requests BOTTLES DRAWN AEROBIC AND ANAEROBIC 5CC   Final   Culture  Setup Time     Final   Value: 01/26/2014 13:31     Performed at [facility]   Culture     Final   Value:  BACILLUS SPECIES     Note: Standardized susceptibility testing for this organism is not available.     Note: Gram Stain Report Called to,Read Back By and Verified With: MICHELLE REEVES 01/26/14 @ 10:30PM BY RUSCOE A.     Performed at [facility]   Report Status 01/27/2014 FINAL   Final  URINE CULTURE     Status: None   Collection Time    01/26/14  4:37 AM      Result Value Ref Range Status   Specimen Description URINE, CLEAN CATCH   Final   Special Requests Immunocompromised   Final   Culture  Setup Time     Final   Value: 01/26/2014 14:46     Performed at [place]     Final   Value: NO GROWTH     Performed at [facility]   Culture     Final   Value: NO GROWTH     Performed at [facility]   Report Status 01/27/2014 FINAL   Final  MRSA PCR SCREENING     Status: None   Collection Time    01/26/14  9:13 AM      Result Value Ref Range Status   MRSA by PCR NEGATIVE  NEGATIVE Final   Comment:            The GeneXpert MRSA Assay (FDA     approved for NASAL specimens     only), is one component of a     comprehensive MRSA colonization     surveillance program. It is not     intended to diagnose MRSA     infection nor to guide or     monitor treatment for     MRSA infections.     Studies: Dg Chest 2 View  01/26/2014   CLINICAL DATA:  Fever, shortness of breath, history of COPD, lung cancer on chemotherapy.  EXAM: CHEST  2 VIEW  COMPARISON:  12/28/2013  FINDINGS: Power port type central venous catheter is unchanged in  position. Heart size and pulmonary vascularity are normal. Scarring and superior retraction in the right hilum likely representing postradiation change. Focal nodular density in the right mid lung laterally likely corresponds to rib fracture on previous CT. No focal airspace disease or consolidation. No pneumothorax.  IMPRESSION: Unchanged appearance of the chest since previous study consistent with history of previous  lung cancer. No evidence of active pulmonary disease.   Electronically Signed   By: Lucienne Capers M.D.   On: 01/26/2014 01:41   Ct Angio Chest Pe W/cm &/or Wo Cm  01/26/2014   CLINICAL DATA:  Tachycardia. Chest pain. Body aches. Nausea. Ongoing chemotherapy for metastatic lung cancer.  EXAM: CT ANGIOGRAPHY CHEST WITH CONTRAST  TECHNIQUE: Multidetector CT imaging of the chest was performed using the standard protocol during bolus administration of intravenous contrast. Multiplanar CT image reconstructions and MIPs were obtained to evaluate the vascular anatomy.  CONTRAST:  150mL OMNIPAQUE IOHEXOL 350 MG/ML SOLN  COMPARISON:  12/29/2013  FINDINGS: Technically adequate study with good opacification of the central and segmental pulmonary arteries. No focal filling defects demonstrated. No evidence of significant pulmonary embolus. Again demonstrated is a right hilar mass with scarring in the right hilum and right upper lung consistent with postradiation therapy changes. The mass and infiltration appear similar to previous study. Subcarinal and right paratracheal lymphadenopathy is similar to previous study. No developing consolidation in the lungs. Visualized upper abdominal organs are unremarkable. Old right rib fractures. No destructive bone lesions appreciated. Thoracic scoliosis convex towards the left.  Review of the MIP images confirms the above findings.  IMPRESSION: No evidence of significant pulmonary embolus. Unchanged appearance of mass and radiation changes in the right hilum and right upper lung. Prominent right paratracheal and subcarinal lymph nodes. No significant changes since prior study.   Electronically Signed   By: Lucienne Capers M.D.   On: 01/26/2014 04:19    Scheduled Meds: . antiseptic oral rinse  15 mL Mouth Rinse BID  . aspirin  325 mg Oral q morning - 10a  . benzonatate  100 mg Oral TID  . budesonide-formoterol  2 puff Inhalation BID  . docusate sodium  100 mg Oral BID  .  enoxaparin (LOVENOX) injection  40 mg Subcutaneous Q24H  . guaiFENesin  1,200 mg Oral BID  . insulin aspart  0-15 Units Subcutaneous TID WC  . insulin aspart  0-5 Units Subcutaneous QHS  . ipratropium  0.5 mg Nebulization QID  . levothyroxine  125 mcg Oral QAC breakfast  . magnesium gluconate  500 mg Oral q morning - 10a  . sertraline  150 mg Oral QHS  . sodium chloride  3 mL Intravenous Q12H  . spironolactone  25 mg Oral q morning - 10a  . varenicline  0.5 mg Oral BID WC  . [START ON 01/29/2014] varenicline  1 mg Oral BID WC   Continuous Infusions:   Principal Problem:   Febrile illness, acute Active Problems:   CARCINOMA, LUNG, SQUAMOUS CELL   DIABETES MELLITUS, TYPE II   Sinus tachycardia   Dehydration   SIRS (systemic inflammatory response syndrome)    Time spent: 35 minutes.  Greater than 50% of this time was spent in direct contact with the patient coordinating care.    [facility] Pager [phone]  If 7PM-7AM, please contact night-coverage at [email or web address], password [facility] 01/27/2014, 3:16 PM  LOS: 2 days

## 2014-01-28 LAB — CBC
HEMATOCRIT: 27 % — AB (ref 36.0–46.0)
Hemoglobin: 8.9 g/dL — ABNORMAL LOW (ref 12.0–15.0)
MCH: 29.4 pg (ref 26.0–34.0)
MCHC: 33 g/dL (ref 30.0–36.0)
MCV: 89.1 fL (ref 78.0–100.0)
Platelets: 97 10*3/uL — ABNORMAL LOW (ref 150–400)
RBC: 3.03 MIL/uL — ABNORMAL LOW (ref 3.87–5.11)
RDW: 17.6 % — AB (ref 11.5–15.5)
WBC: 4.4 10*3/uL (ref 4.0–10.5)

## 2014-01-28 LAB — BASIC METABOLIC PANEL
BUN: 8 mg/dL (ref 6–23)
CHLORIDE: 99 meq/L (ref 96–112)
CO2: 28 mEq/L (ref 19–32)
CREATININE: 0.65 mg/dL (ref 0.50–1.10)
Calcium: 8.2 mg/dL — ABNORMAL LOW (ref 8.4–10.5)
GFR calc non Af Amer: >90 mL/min (ref >90)
Glucose, Bld: 187 mg/dL — ABNORMAL HIGH (ref 70–99)
POTASSIUM: 4.1 meq/L (ref 3.7–5.3)
Sodium: 136 mEq/L — ABNORMAL LOW (ref 137–147)

## 2014-01-28 LAB — GLUCOSE, CAPILLARY: Glucose-Capillary: 241 mg/dL — ABNORMAL HIGH (ref 70–99)

## 2014-01-28 LAB — PROCALCITONIN: Procalcitonin: 0.13 ng/mL

## 2014-01-28 MED ORDER — HEPARIN SOD (PORK) LOCK FLUSH 100 UNIT/ML IV SOLN
500.0000 [IU] | INTRAVENOUS | Status: AC | PRN
  Administered 2014-01-28: 500 [IU]

## 2014-01-28 MED ORDER — HYDROCODONE-ACETAMINOPHEN 5-325 MG PO TABS: 1.0000 | ORAL_TABLET | ORAL | Status: DC | PRN

## 2014-01-28 MED ORDER — SODIUM CHLORIDE 0.9 % IJ SOLN
10.0000 mL | INTRAMUSCULAR | Status: DC | PRN
  Administered 2014-01-28 (×2): 10 mL

## 2014-01-28 MED ORDER — MOXIFLOXACIN HCL 400 MG PO TABS: 400.0000 mg | ORAL_TABLET | Freq: Every day | ORAL | Status: DC

## 2014-01-30 ENCOUNTER — Other Ambulatory Visit: Payer: Medicare Other

## 2014-01-30 ENCOUNTER — Ambulatory Visit: Payer: Medicare Other

## 2014-02-01 LAB — CULTURE, BLOOD (ROUTINE X 2): Culture: NO GROWTH

## 2014-02-06 ENCOUNTER — Ambulatory Visit (HOSPITAL_BASED_OUTPATIENT_CLINIC_OR_DEPARTMENT_OTHER): Payer: Medicare Other | Admitting: Internal Medicine

## 2014-02-06 ENCOUNTER — Ambulatory Visit: Payer: Medicare Other

## 2014-02-06 ENCOUNTER — Other Ambulatory Visit (HOSPITAL_BASED_OUTPATIENT_CLINIC_OR_DEPARTMENT_OTHER): Payer: Medicare Other

## 2014-02-06 ENCOUNTER — Encounter: Payer: Self-pay | Admitting: Internal Medicine

## 2014-02-06 ENCOUNTER — Telehealth: Payer: Self-pay | Admitting: Internal Medicine

## 2014-02-06 ENCOUNTER — Other Ambulatory Visit: Payer: Medicare Other

## 2014-02-06 VITALS — BP 126/58 | HR 106 | Temp 97.9°F | Resp 20 | Ht 66.0 in | Wt 185.8 lb

## 2014-02-06 DIAGNOSIS — C3410 Malignant neoplasm of upper lobe, unspecified bronchus or lung: Secondary | ICD-10-CM

## 2014-02-06 DIAGNOSIS — C3490 Malignant neoplasm of unspecified part of unspecified bronchus or lung: Secondary | ICD-10-CM

## 2014-02-06 LAB — CBC WITH DIFFERENTIAL/PLATELET
BASO%: 0.9 % (ref 0.0–2.0)
Basophils Absolute: 0 10*3/uL (ref 0.0–0.1)
EOS%: 3.3 % (ref 0.0–7.0)
Eosinophils Absolute: 0.2 10*3/uL (ref 0.0–0.5)
HCT: 35.1 % (ref 34.8–46.6)
HEMOGLOBIN: 11.5 g/dL — AB (ref 11.6–15.9)
LYMPH#: 0.6 10*3/uL — AB (ref 0.9–3.3)
LYMPH%: 13 % — ABNORMAL LOW (ref 14.0–49.7)
MCH: 29.9 pg (ref 25.1–34.0)
MCHC: 32.6 g/dL (ref 31.5–36.0)
MCV: 91.6 fL (ref 79.5–101.0)
MONO#: 0.4 10*3/uL (ref 0.1–0.9)
MONO%: 7.6 % (ref 0.0–14.0)
NEUT#: 3.7 10*3/uL (ref 1.5–6.5)
NEUT%: 75.2 % (ref 38.4–76.8)
Platelets: 369 10*3/uL (ref 145–400)
RBC: 3.84 10*6/uL (ref 3.70–5.45)
RDW: 20.7 % — ABNORMAL HIGH (ref 11.2–14.5)
WBC: 5 10*3/uL (ref 3.9–10.3)

## 2014-02-06 LAB — COMPREHENSIVE METABOLIC PANEL (CC13)
ALBUMIN: 3.6 g/dL (ref 3.5–5.0)
ALK PHOS: 66 U/L (ref 40–150)
ALT: 32 U/L (ref 0–55)
AST: 33 U/L (ref 5–34)
Anion Gap: 12 mEq/L — ABNORMAL HIGH (ref 3–11)
BUN: 8.6 mg/dL (ref 7.0–26.0)
CHLORIDE: 104 meq/L (ref 98–109)
CO2: 22 mEq/L (ref 22–29)
Calcium: 8.6 mg/dL (ref 8.4–10.4)
Creatinine: 0.8 mg/dL (ref 0.6–1.1)
Glucose: 253 mg/dl — ABNORMAL HIGH (ref 70–140)
POTASSIUM: 4.1 meq/L (ref 3.5–5.1)
SODIUM: 139 meq/L (ref 136–145)
Total Bilirubin: 0.24 mg/dL (ref 0.20–1.20)
Total Protein: 6.9 g/dL (ref 6.4–8.3)

## 2014-02-06 NOTE — Telephone Encounter (Signed)
gv adn rpinted appt sched and avs for pt for June...gv pt barium

## 2014-02-06 NOTE — Progress Notes (Signed)
[place] [phone]   [phone]  OFFICE PROGRESS NOTE  [name], NP [place] [address]  DIAGNOSIS: Recurrent non-small cell lung cancer, squamous cell carcinoma initially diagnosis stage IIIa (T1 N2 MX ) in August of 2010.   PRIOR THERAPY:  #1 status post concurrent chemoradiation with weekly carboplatin and paclitaxel, last dose of chemotherapy given 08/05/2011.  #2 status post consolidation chemotherapy with carboplatin paclitaxel last dose given 10/27/2009.  #3 Concurrent chemoradiation with weekly carboplatin for an AUC of 2 and paclitaxel at 45 mg per meter squared given concurrent with radiotherapy, last dose was given 09/20/2011.  #4 Systemic chemotherapy with gemcitabine 1000 mg meter squared on days 1 and 8 every 3 weeks status post 3 cycles with stable disease, last dose was given 12/20/2011.   CURRENT THERAPY: Systemic chemotherapy again with single agent gemcitabine 1000 mg/M2 on days 1 and 8 every 3 weeks. First cycle 11/07/2013. Status post 4 cycles.  INTERVAL HISTORY: Janice Ball 58 y.o. female returns to the clinic today for routine three-month follow up visit. She tolerated the last cycle of her systemic chemotherapy with gemcitabine fairly well with no significant complaints, but she was admitted to [facility] at least twice recently after her chemotherapy with questionable pneumonia. She is feeling better today with no fever or chills. She denied having any significant weight loss or night sweats. She has no chest pain, shortness of breath, cough or hemoptysis.  She denied any significant nausea or vomiting. She is here for evaluation and discussion of her treatment options but the patient is considering taking a break off chemotherapy.  MEDICAL HISTORY: Past Medical History  Diagnosis Date  . Anxiety   . Hyperlipidemia   . Hypothyroidism   . COPD (chronic obstructive pulmonary  disease)   . Arthritis     thumbs  . 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 Diabetes mellitus   . Lung cancer dx'd 05/2009  . Lung cancer dx'd 09/2013    recurrence in LN    ALLERGIES:  is allergic to sulfonamide derivatives; codeine; and penicillins.  MEDICATIONS:  Current Outpatient Prescriptions  Medication Sig Dispense Refill  . acetaminophen (TYLENOL) 325 MG tablet Take 650 mg by mouth every 6 (six) hours as needed for mild pain or headache.       . albuterol (PROVENTIL HFA;VENTOLIN HFA) 108 (90 BASE) MCG/ACT inhaler Inhale 2 puffs into the lungs every 6 (six) hours as needed for wheezing or shortness of breath.      Marland Kitchen albuterol (PROVENTIL) (2.5 MG/3ML) 0.083% nebulizer solution Take 3 mLs (2.5 mg total) by nebulization every 6 (six) hours as needed for wheezing or shortness of breath.  150 mL  0  . aspirin 325 MG tablet Take 325 mg by mouth every morning.       . benzonatate (TESSALON) 100 MG capsule Take 1 capsule (100 mg total) by mouth 3 (three) times daily.  20 capsule  0  . budesonide-formoterol (SYMBICORT) 160-4.5 MCG/ACT inhaler Inhale 2 puffs into the lungs 2 (two) times daily.      . CHANTIX STARTING MONTH PAK 0.5 MG X 11 & 1 MG X 42 tablet Take 0.5 mg by mouth 2 (two) times daily.       . cholecalciferol (VITAMIN D) 1000 UNITS tablet Take 1,000 Units by mouth every morning.       . Chromium 1000 MCG TABS Take 1 tablet by mouth  every morning.       . cyanocobalamin 1000 MCG tablet Take 1,500 mcg by mouth every morning.      Marland Kitchen glimepiride (AMARYL) 2 MG tablet Take 2 mg by mouth daily with breakfast.       . guaiFENesin (MUCINEX) 600 MG 12 hr tablet Take 1,200 mg by mouth 2 (two) times daily.      . insulin aspart (NOVOLOG) 100 UNIT/ML injection Inject 0-20 Units into the skin 3 (three) times daily before meals.  10 mL  11  . ipratropium (ATROVENT) 0.02 % nebulizer solution Take 2.5 mLs (0.5 mg total) by nebulization 4 (four) times daily.  150 mL  0  . KRILL OIL  PO Take 1 tablet by mouth every morning.       Marland Kitchen levothyroxine (SYNTHROID, LEVOTHROID) 125 MCG tablet Take 125 mcg by mouth daily before breakfast.       . lidocaine-prilocaine (EMLA) cream Apply 1 application topically as needed (skin numbing).      . LORazepam (ATIVAN) 1 MG tablet Take 1 mg by mouth 4 (four) times daily as needed for anxiety.      . magnesium gluconate (MAGONATE) 500 MG tablet Take 500 mg by mouth every morning.       . metFORMIN (GLUCOPHAGE) 1000 MG tablet Take 1,000 mg by mouth 2 (two) times daily with a meal.      . Multiple Vitamin (MULTIVITAMIN WITH MINERALS) TABS tablet Take 1 tablet by mouth every morning.      . prochlorperazine (COMPAZINE) 10 MG tablet Take 10 mg by mouth every 6 (six) hours as needed for nausea or vomiting.      . sertraline (ZOLOFT) 100 MG tablet Take 150 mg by mouth at bedtime. Takes 1 1/2 tabs daily for total dose of 150 mg      . spironolactone (ALDACTONE) 25 MG tablet Take 25 mg by mouth every morning.       Marland Kitchen HYDROcodone-acetaminophen (NORCO/VICODIN) 5-325 MG per tablet Take 1-2 tablets by mouth every 4 (four) hours as needed for moderate pain.  30 tablet  0   No current facility-administered medications for this visit.    SURGICAL HISTORY:  Past Surgical History  Procedure Laterality Date  . Total abdominal hysterectomy    . Thyroidectomy, partial    . Tonsillectomy    . Tubal ligation      REVIEW OF SYSTEMS:  Constitutional: negative Eyes: negative Ears, nose, mouth, throat, and face: negative Respiratory: negative Cardiovascular: negative Gastrointestinal: negative Genitourinary:negative Integument/breast: negative Hematologic/lymphatic: negative Musculoskeletal:negative Neurological: negative Behavioral/Psych: negative Endocrine: negative Allergic/Immunologic: negative   PHYSICAL EXAMINATION: General appearance: alert, cooperative and no distress Head: Normocephalic, without obvious abnormality, atraumatic Neck: no  adenopathy, no JVD, supple, symmetrical, trachea midline and thyroid not enlarged, symmetric, no tenderness/mass/nodules Lymph nodes: Cervical, supraclavicular, and axillary nodes normal. Resp: clear to auscultation bilaterally Back: symmetric, no curvature. ROM normal. No CVA tenderness. Cardio: regular rate and rhythm, S1, S2 normal, no murmur, click, rub or gallop GI: soft, non-tender; bowel sounds normal; no masses,  no organomegaly Extremities: extremities normal, atraumatic, no cyanosis or edema Neurologic: Alert and oriented X 3, normal strength and tone. Normal symmetric reflexes. Normal coordination and gait  ECOG PERFORMANCE STATUS: 1 - Symptomatic but completely ambulatory  Blood pressure 126/58, pulse 106, temperature 97.9 F (36.6 C), temperature source Oral, resp. rate 20, height 5\' 6"  (1.676 m), weight 185 lb 12.8 oz (84.278 kg).  LABORATORY DATA: Lab Results  Component Value Date  WBC 5.0 02/06/2014   HGB 11.5* 02/06/2014   HCT 35.1 02/06/2014   MCV 91.6 02/06/2014   PLT 369 02/06/2014      Chemistry      Component Value Date/Time   NA 136* 01/28/2014 0548   NA 137 01/23/2014 1012   NA 141 04/04/2012 0945   K 4.1 01/28/2014 0548   K 4.0 01/23/2014 1012   K 4.7 04/04/2012 0945   CL 99 01/28/2014 0548   CL 101 04/06/2013 1041   CL 100 04/04/2012 0945   CO2 28 01/28/2014 0548   CO2 23 01/23/2014 1012   CO2 29 04/04/2012 0945   BUN 8 01/28/2014 0548   BUN 6.9* 01/23/2014 1012   BUN 16 04/04/2012 0945   CREATININE 0.65 01/28/2014 0548   CREATININE 0.7 01/23/2014 1012   CREATININE 0.9 04/04/2012 0945      Component Value Date/Time   CALCIUM 8.2* 01/28/2014 0548   CALCIUM 8.7 01/23/2014 1012   CALCIUM 8.4 04/04/2012 0945   ALKPHOS 56 01/27/2014 0421   ALKPHOS 66 01/23/2014 1012   ALKPHOS 61 04/04/2012 0945   AST 22 01/27/2014 0421   AST 32 01/23/2014 1012   AST 31 04/04/2012 0945   ALT 34 01/27/2014 0421   ALT 35 01/23/2014 1012   ALT 30 04/04/2012 0945   BILITOT 0.3 01/27/2014 0421    BILITOT 0.20 01/23/2014 1012   BILITOT 0.50 04/04/2012 0945       RADIOGRAPHIC STUDIES:  ASSESSMENT AND PLAN: This is a very pleasant 58 years old white female with recurrent non-small cell lung cancer status post concurrent chemoradiation as well as consolidation chemotherapy and she is currently on systemic chemotherapy with single agent gemcitabine status post 4 cycles. The patient is tolerating her treatment with gemcitabine fairly well except for recent admissions to [facility] with questionable pneumonia twice in the last few weeks. I have a lengthy discussion with the patient today about her current disease status and treatment options. I gave the patient the option of continuing her systemic chemotherapy with single agent gemcitabine versus taken break off treatment for the next 2-3 months. She is interested in taken a chemotherapy holiday. I would see her back for follow up visit in 2 months with repeat CT scan of the chest, abdomen and pelvis for restaging of her disease.  If she has any evidence for disease progression on the upcoming scan, the patient may be considered for treatment with immunotherapy. The patient voices understanding of current disease status and treatment options and is in agreement with the current care plan.  All questions were answered. The patient knows to call the clinic with any problems, questions or concerns. We can certainly see the patient much sooner if necessary.   Disclaimer: This note was dictated with voice recognition software. Similar sounding words can inadvertently be transcribed and may not be corrected upon review.

## 2014-02-13 ENCOUNTER — Ambulatory Visit: Payer: Medicare Other

## 2014-02-13 ENCOUNTER — Other Ambulatory Visit: Payer: Medicare Other

## 2014-02-19 ENCOUNTER — Ambulatory Visit: Payer: Medicare Other | Admitting: Internal Medicine

## 2014-02-23 NOTE — Discharge Summary (Signed)
Physician Discharge Summary  Janice Ball:[id number] DOB: Mar 21, 1956 DOA: 01/25/2014  PCP: [name], NP  Admit date: 01/25/2014 Discharge date: 02/27/2014  Time spent: 45 minutes  Recommendations for Outpatient Follow-up:  -Was discharged home.   Discharge Diagnoses:  Principal Problem:   Febrile illness, acute Active Problems:   CARCINOMA, LUNG, SQUAMOUS CELL   DIABETES MELLITUS, TYPE II   Sinus tachycardia   Dehydration   SIRS (systemic inflammatory response syndrome)   Discharge Condition: Stable and improved  Filed Weights   01/26/14 0900 01/27/14 0400 01/27/14 1644  Weight: 86.2 kg (190 lb 0.6 oz) 86 kg (189 lb 9.5 oz) 86.8 kg (191 lb 5.8 oz)    History of present illness:  Janice Ball is a 58 y.o. female  has a past medical history of Anxiety; Hyperlipidemia; Hypothyroidism; COPD (chronic obstructive pulmonary disease); Arthritis; Radiation (06/30/09-08/15/09); Diabetes mellitus; Lung cancer (dx'd 05/2009); and Lung cancer (dx'd 09/2013).  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 She have had chemo and radiation therapy. currently on 5 cycle of gemzar. Patient had her last treatment on 4/08. In about 24-48 hours after infusion she developed myalgia's and fever up to 102. Patient presented to ER and was found to be tachycardia up to 120's.. She endorsed being short of breath. CTA of the chest did not show PE And no acute infiltrates. Patient denies any other localizing complaints. She had some nausea. No chest pain, but mild tightness. She had some mild cough no urinary complaints. hospitalist was called for admission. Of note patient have been recently hospitalized for CAP.   Hospital Course:   Acute Febrile Illness  -Etiology remains unclear.  -Not neutropenic.  -Urine/Blood cx remain negative.  -Flu PCR negative (DC tamiflu).   Lung Cancer  -FU with Dr. [name]  DM  -Follow CBGs on SSI.   SIRS    -Resolved.   Procedures:  None   Consultations:  None  Discharge Instructions  Discharge Orders   Future Appointments Provider Department Dept Phone   03/05/2014 10:15 AM [facility] Nurse [facility] [phone]   03/05/2014 11:30 AM Brand Males, MD [facility] [phone]   04/08/2014 10:00 AM [facility] [phone]   04/08/2014 10:30 AM [facility] [phone]   Liquids only 4 hours prior to your exam. Any medications can be taken as usual. Please arrive 15 min prior to your scheduled exam time.   04/15/2014 10:30 AM Curt Bears, MD [facility] [phone]   Future Orders Complete By Expires   Discontinue IV  As directed    Increase activity slowly  As directed        Medication List         acetaminophen 325 MG tablet  Commonly known as:  TYLENOL  Take 650 mg by mouth every 6 (six) hours as needed for mild pain or headache.     albuterol 108 (90 BASE) MCG/ACT inhaler  Commonly known as:  PROVENTIL HFA;VENTOLIN HFA  Inhale 2 puffs into the lungs every 6 (six) hours as needed for wheezing or shortness of breath.     albuterol (2.5 MG/3ML) 0.083% nebulizer solution  Commonly known as:  PROVENTIL  Take 3 mLs (2.5 mg total) by nebulization every 6 (six) hours as needed for wheezing or shortness of breath.     aspirin 325 MG tablet  Take 325 mg by mouth every morning.     benzonatate 100 MG capsule  Commonly known as:  TESSALON  Take 1 capsule (100 mg total) by mouth 3 (three) times daily.     budesonide-formoterol 160-4.5 MCG/ACT inhaler  Commonly known as:  SYMBICORT  Inhale 2 puffs into the lungs 2 (two) times daily.     CHANTIX STARTING MONTH PAK 0.5 MG X 11 & 1 MG X 42 tablet  Generic drug:  varenicline  Take 0.5 mg by mouth 2 (two) times daily.     cholecalciferol 1000 UNITS tablet   Commonly known as:  VITAMIN D  Take 1,000 Units by mouth every morning.     Chromium 1000 MCG Tabs  Take 1 tablet by mouth every morning.     cyanocobalamin 1000 MCG tablet  Take 1,500 mcg by mouth every morning.     glimepiride 2 MG tablet  Commonly known as:  AMARYL  Take 2 mg by mouth daily with breakfast.     guaiFENesin 600 MG 12 hr tablet  Commonly known as:  MUCINEX  Take 1,200 mg by mouth 2 (two) times daily.     HYDROcodone-acetaminophen 5-325 MG per tablet  Commonly known as:  NORCO/VICODIN  Take 1-2 tablets by mouth every 4 (four) hours as needed for moderate pain.     insulin aspart 100 UNIT/ML injection  Commonly known as:  novoLOG  Inject 0-20 Units into the skin 3 (three) times daily before meals.     ipratropium 0.02 % nebulizer solution  Commonly known as:  ATROVENT  Take 2.5 mLs (0.5 mg total) by nebulization 4 (four) times daily.     KRILL OIL PO  Take 1 tablet by mouth every morning.     levothyroxine 125 MCG tablet  Commonly known as:  SYNTHROID, LEVOTHROID  Take 125 mcg by mouth daily before breakfast.     lidocaine-prilocaine cream  Commonly known as:  EMLA  Apply 1 application topically as needed (skin numbing).     LORazepam 1 MG tablet  Commonly known as:  ATIVAN  Take 1 mg by mouth 4 (four) times daily as needed for anxiety.     magnesium gluconate 500 MG tablet  Commonly known as:  MAGONATE  Take 500 mg by mouth every morning.     metFORMIN 1000 MG tablet  Commonly known as:  GLUCOPHAGE  Take 1,000 mg by mouth 2 (two) times daily with a meal.     multivitamin with minerals Tabs tablet  Take 1 tablet by mouth every morning.     prochlorperazine 10 MG tablet  Commonly known as:  COMPAZINE  Take 10 mg by mouth every 6 (six) hours as needed for nausea or vomiting.     sertraline 100 MG tablet  Commonly known as:  ZOLOFT  Take 150 mg by mouth at bedtime. Takes 1 1/2 tabs daily for total dose of 150 mg     spironolactone 25 MG  tablet  Commonly known as:  ALDACTONE  Take 25 mg by mouth every morning.       Allergies  Allergen Reactions  . Sulfonamide Derivatives Rash  . Codeine Other (See Comments)    hallucinations  . Penicillins Other (See Comments)    Childhood allergy; reaction unknown       Follow-up Information   Follow up with [name], NP. Schedule an appointment as soon as possible for a visit in 2 weeks.   Specialty:  Nurse Practitioner   Contact information:  [address] [place] [address] [phone]        The results of significant diagnostics from this hospitalization (including imaging, microbiology, ancillary and laboratory) are listed below for reference.    Significant Diagnostic Studies: Dg Chest 2 View  01/26/2014   CLINICAL DATA:  Fever, shortness of breath, history of COPD, lung cancer on chemotherapy.  EXAM: CHEST  2 VIEW  COMPARISON:  12/28/2013  FINDINGS: Power port type central venous catheter is unchanged in position. Heart size and pulmonary vascularity are normal. Scarring and superior retraction in the right hilum likely representing postradiation change. Focal nodular density in the right mid lung laterally likely corresponds to rib fracture on previous CT. No focal airspace disease or consolidation. No pneumothorax.  IMPRESSION: Unchanged appearance of the chest since previous study consistent with history of previous lung cancer. No evidence of active pulmonary disease.   Electronically Signed   By: Lucienne Capers M.D.   On: 01/26/2014 01:41   Ct Angio Chest Pe W/cm &/or Wo Cm  01/26/2014   CLINICAL DATA:  Tachycardia. Chest pain. Body aches. Nausea. Ongoing chemotherapy for metastatic lung cancer.  EXAM: CT ANGIOGRAPHY CHEST WITH CONTRAST  TECHNIQUE: Multidetector CT imaging of the chest was performed using the standard protocol during bolus administration of intravenous contrast. Multiplanar CT image reconstructions and MIPs were obtained to  evaluate the vascular anatomy.  CONTRAST:  116mL OMNIPAQUE IOHEXOL 350 MG/ML SOLN  COMPARISON:  12/29/2013  FINDINGS: Technically adequate study with good opacification of the central and segmental pulmonary arteries. No focal filling defects demonstrated. No evidence of significant pulmonary embolus. Again demonstrated is a right hilar mass with scarring in the right hilum and right upper lung consistent with postradiation therapy changes. The mass and infiltration appear similar to previous study. Subcarinal and right paratracheal lymphadenopathy is similar to previous study. No developing consolidation in the lungs. Visualized upper abdominal organs are unremarkable. Old right rib fractures. No destructive bone lesions appreciated. Thoracic scoliosis convex towards the left.  Review of the MIP images confirms the above findings.  IMPRESSION: No evidence of significant pulmonary embolus. Unchanged appearance of mass and radiation changes in the right hilum and right upper lung. Prominent right paratracheal and subcarinal lymph nodes. No significant changes since prior study.   Electronically Signed   By: Lucienne Capers M.D.   On: 01/26/2014 04:19    Microbiology: No results found for this or any previous visit (from the past 240 hour(s)).   Labs: Basic Metabolic Panel: No results found for this basename: NA, K, CL, CO2, GLUCOSE, BUN, CREATININE, CALCIUM, MG, PHOS,  in the last 168 hours Liver Function Tests: No results found for this basename: AST, ALT, ALKPHOS, BILITOT, PROT, ALBUMIN,  in the last 168 hours No results found for this basename: LIPASE, AMYLASE,  in the last 168 hours No results found for this basename: AMMONIA,  in the last 168 hours CBC: No results found for this basename: WBC, NEUTROABS, HGB, HCT, MCV, PLT,  in the last 168 hours Cardiac Enzymes: No results found for this basename: CKTOTAL, CKMB, CKMBINDEX, TROPONINI,  in the last 168 hours BNP: BNP (last 3 results) No  results found for this basename: PROBNP,  in the last 8760 hours CBG: No results found for this basename: GLUCAP,  in the last 168 hours     Signed:  [facility] Pager: [phone] 02/23/2014, 2:20 PM

## 2014-03-05 ENCOUNTER — Ambulatory Visit (INDEPENDENT_AMBULATORY_CARE_PROVIDER_SITE_OTHER): Payer: Medicare Other | Admitting: Internal Medicine

## 2014-03-05 ENCOUNTER — Ambulatory Visit (HOSPITAL_BASED_OUTPATIENT_CLINIC_OR_DEPARTMENT_OTHER): Payer: Medicare Other

## 2014-03-05 ENCOUNTER — Encounter: Payer: Self-pay | Admitting: Internal Medicine

## 2014-03-05 VITALS — BP 139/69 | HR 110 | Temp 97.0°F

## 2014-03-05 VITALS — BP 138/72 | HR 112 | Ht 68.0 in | Wt 186.4 lb

## 2014-03-05 DIAGNOSIS — J449 Chronic obstructive pulmonary disease, unspecified: Secondary | ICD-10-CM

## 2014-03-05 DIAGNOSIS — Z23 Encounter for immunization: Secondary | ICD-10-CM

## 2014-03-05 DIAGNOSIS — Z95828 Presence of other vascular implants and grafts: Secondary | ICD-10-CM

## 2014-03-05 DIAGNOSIS — Z452 Encounter for adjustment and management of vascular access device: Secondary | ICD-10-CM

## 2014-03-05 DIAGNOSIS — C3410 Malignant neoplasm of upper lobe, unspecified bronchus or lung: Secondary | ICD-10-CM

## 2014-03-05 MED ORDER — SODIUM CHLORIDE 0.9 % IJ SOLN
10.0000 mL | INTRAMUSCULAR | Status: DC | PRN
  Administered 2014-03-05: 10 mL via INTRAVENOUS
  Filled 2014-03-05: qty 10

## 2014-03-05 MED ORDER — BUDESONIDE-FORMOTEROL FUMARATE 160-4.5 MCG/ACT IN AERO: 2.0000 | INHALATION_SPRAY | Freq: Two times a day (BID) | RESPIRATORY_TRACT | Status: DC

## 2014-03-05 MED ORDER — ALBUTEROL SULFATE HFA 108 (90 BASE) MCG/ACT IN AERS: 2.0000 | INHALATION_SPRAY | Freq: Four times a day (QID) | RESPIRATORY_TRACT | Status: DC | PRN

## 2014-03-05 MED ORDER — BENZONATATE 100 MG PO CAPS: 200.0000 mg | ORAL_CAPSULE | Freq: Every day | ORAL | Status: DC | PRN

## 2014-03-05 MED ORDER — HEPARIN SOD (PORK) LOCK FLUSH 100 UNIT/ML IV SOLN
500.0000 [IU] | Freq: Once | INTRAVENOUS | Status: AC
  Administered 2014-03-05: 500 [IU] via INTRAVENOUS
  Filled 2014-03-05: qty 5

## 2014-03-05 MED ORDER — IPRATROPIUM BROMIDE 0.02 % IN SOLN: 0.5000 mg | Freq: Four times a day (QID) | RESPIRATORY_TRACT | Status: DC

## 2014-03-05 NOTE — Patient Instructions (Signed)
#  COPD - currently stable  -  continue your symbicort and atrovent mdi/nebs;  will do refill - restart tessalon perles 200mg  once daily as needed for cough; will do script - call us anytime for a flare up - Have PREVNAR vaccine 03/05/2014   #followup  6 mnths or sooner if neeed CAT score at followup

## 2014-03-05 NOTE — Progress Notes (Signed)
Subjective:    Patient ID: Janice Ball, female    DOB: 19-Jan-1956, 58 y.o.   MRN: [id number]  HPI  Gold stage 2 COPD - MM genotype and class II dyspnea - July 2012: PFT today that showed preserved lung fxn -mild COPD - FEV1 2.19 L/M (80%), ratio of 66. No sign. Change with SABA . DLCO 65%  #Rec AECOPD - June 2012-outpatient treatment with antibiotics and prednisone. - August 2013-outpatient treatment with doxycycline and prednisone - December 2013-treatment with azithromycin and prednisone by primary care physician - sept 2014 - abx and 1 shot of steroid at [place] office [facility], NP - Nov 2014 - levaquin and steroid 5 day  #Smoking history - Quit 2010.  - Relapse summer 2012 but quit in 1 month - Relapsed May 2013 and quit with the help of Chantix. Quit July 2013 - Relapsed January 2014; due to social stressor; quit again May 2014  #Chronic postnasal drainage and spring allergies  - nasal steroids  #Lung cancer  DIAGNOSIS: Recurrent non-small cell lung cancer, squamous cell carcinoma initially diagnosis stage IIIa (T1 N2 MX ) in August of 2010.   #1 status post concurrent chemoradiation with weekly carboplatin and paclitaxel, last dose of chemotherapy given 08/05/2011.   #2 status post consolidation chemotherapy with carboplatin paclitaxel last dose given 10/27/2009.   #3 Concurrent chemoradiation with weekly carboplatin for an AUC of 2 and paclitaxel at 45 mg per meter squared given concurrent with radiotherapy, last dose was given  09/20/2011.   #4 Systemic chemotherapy with gemcitabine 1000 mg meter squared on days 1 and 8 every 3 weeks status post 3 cycles with stable disease. last dose was given 12/20/2011.   CURRENT THERAPY:  Systemic chemotherapy again with single agent gemcitabine 1000 mg/M2 on days 1 and 8 every 3 weeks. First cycle 11/07/2013. Status post 4 cycles as of 02/06/14          OV 08/29/2013  SMoking:   reports that she quit smoking about 5 months  ago. Her smoking use included Cigarettes. She has a 45 pack-year smoking history. She has never used smokeless tobacco.  COPD:: Since I last saw her in April 2014. She had a flareup of COPD in September 2014 and was treated with antibiotics not otherwise specified and 1 injection of steroid by her primary care physician. After that was doing well but just over a week ago started having worsening cough, wheeze and shortness of breath and was started on Levaquin on 08/23/2013 and was advised to take it for 10 days. He's had partial improvement but still has chest tightness. She still continues with Levaquin. She has not taken prednisone although she feels that she could benefit potentially from this. She is open to being a research subject on copd trials  Hoarseness of voice: Resolved    OV 03/05/2014  Chief Complaint  Patient presents with  . Follow-up    Pt states breathing is unchanged. C/o cough producing white and clear mucous. Denies CP.    SMoking:  reports that she quit smoking about a year ago. Her smoking use included Cigarettes. She has a 45 pack-year smoking history. She has never used smokeless tobacco.   Lung cancer: Since I saw her in November 2014 she has had recurrent non-small cell lung cancer. She was subjected to chemotherapy for cycle starting January 2015. She did not overall handle this chemotherapy well. She had a hospitalization in April for pneumonia and subsequently for generalized fatigue according to her  history. She has followup scan pending with Dr. [name] in one month.  COPD: Has eos 200 or > 3%. This is a marker for risks for recurrent COPD exacerbation. At this point in time she is  not in COPD exacerbation. COPD stable. COPD Score is 10. She does want tessalon perles for cough. She wants refills. She maintains compliance. She has not had but will hve PREVNAR today  CAT COPD Symptom and Quality of Life Score ([employer]) 03/03/12 02/07/2013   03/05/2014   Never Cough -> Cough all the time 4 3 3   No phlegm in chest -> Chest is full of phlegm 3 3 2   No chest tightness -> Chest feels very tight 2 1 0  No dyspnea for 1 flight stairs/hill -> Very dyspneic for 1 flight of stairs 4 3 2   No limitations for ADL at home -> Very limited with ADL at home 3 2 1   Confident leaving home -> Not at all confident leaving home 0 0 0  Sleep soundly -> Do not sleep soundly because of lung condition 1 0 0  Lots of Energy -> No energy at all 4 2 2   TOTAL Score (max 40)  21 14 10       Past, Family, Social reviewed: 69-year-old grandchild diagnosed with rare spinal and brain cancer   Review of Systems  Constitutional: Negative for fever and unexpected weight change.  HENT: Positive for rhinorrhea and sneezing. Negative for congestion, dental problem, ear pain, nosebleeds, postnasal drip, sinus pressure, sore throat and trouble swallowing.   Eyes: Negative for redness and itching.  Respiratory: Positive for cough. Negative for chest tightness, shortness of breath and wheezing.   Cardiovascular: Negative for palpitations and leg swelling.  Gastrointestinal: Negative for nausea and vomiting.  Genitourinary: Negative for dysuria.  Musculoskeletal: Negative for joint swelling.  Skin: Negative for rash.  Neurological: Negative for headaches.  Hematological: Does not bruise/bleed easily.  Psychiatric/Behavioral: Negative for dysphoric mood. The patient is not nervous/anxious.        Objective:   Physical Exam  Vitals reviewed. Constitutional: She is oriented to person, place, and time. She appears well-developed and well-nourished. No distress.  HENT:  Head: Normocephalic and atraumatic.  Right Ear: External ear normal.  Left Ear: External ear normal.  Mouth/Throat: Oropharynx is clear and moist. No oropharyngeal exudate.  Eyes: Conjunctivae and EOM are normal. Pupils are equal, round, and reactive to light. Right eye exhibits no discharge. Left  eye exhibits no discharge. No scleral icterus.  Neck: Normal range of motion. Neck supple. No JVD present. No tracheal deviation present. No thyromegaly present.  Cardiovascular: Normal rate, regular rhythm, normal heart sounds and intact distal pulses.  Exam reveals no gallop and no friction rub.   No murmur heard. Pulmonary/Chest: Effort normal and breath sounds normal. No respiratory distress. She has no wheezes. She has no rales. She exhibits no tenderness.  Abdominal: Soft. Bowel sounds are normal. She exhibits no distension and no mass. There is no tenderness. There is no rebound and no guarding.  Musculoskeletal: Normal range of motion. She exhibits no edema and no tenderness.  Lymphadenopathy:    She has no cervical adenopathy.  Neurological: She is alert and oriented to person, place, and time. She has normal reflexes. No cranial nerve deficit. She exhibits normal muscle tone. Coordination normal.  Skin: Skin is warm and dry. No rash noted. She is not diaphoretic. No erythema. No pallor.  Psychiatric: She has a normal mood and  affect. Her behavior is normal. Judgment and thought content normal.          Assessment & Plan:  #COPD - currently stable  -  continue your symbicort and atrovent mdi/nebs;  will do refill - restart tessalon perles 200mg  once daily as needed for cough; will do script - call us anytime for a flare up - Have PREVNAR vaccine 03/05/2014   #followup  6 mnths or sooner if neeed CAT score at followup

## 2014-03-05 NOTE — Patient Instructions (Signed)

## 2014-03-07 ENCOUNTER — Other Ambulatory Visit: Payer: Self-pay | Admitting: Nurse Practitioner

## 2014-03-07 DIAGNOSIS — IMO0002 Reserved for concepts with insufficient information to code with codable children: Secondary | ICD-10-CM

## 2014-03-07 DIAGNOSIS — Z1231 Encounter for screening mammogram for malignant neoplasm of breast: Secondary | ICD-10-CM

## 2014-03-11 DIAGNOSIS — J449 Chronic obstructive pulmonary disease, unspecified: Secondary | ICD-10-CM | POA: Insufficient documentation

## 2014-03-11 DIAGNOSIS — Z23 Encounter for immunization: Secondary | ICD-10-CM | POA: Insufficient documentation

## 2014-03-11 NOTE — Assessment & Plan Note (Signed)
#  COPD - currently stable  -  continue your symbicort and atrovent mdi/nebs;  will do refill - restart tessalon perles 200mg  once daily as needed for cough; will do script - call us anytime for a flare up - Have PREVNAR vaccine 03/05/2014   #followup  6 mnths or sooner if neeed CAT score at followup

## 2014-03-14 ENCOUNTER — Encounter: Payer: Self-pay | Admitting: Internal Medicine

## 2014-03-19 ENCOUNTER — Ambulatory Visit
Admission: RE | Admit: 2014-03-19 | Discharge: 2014-03-19 | Disposition: A | Discharge status: Home / Self Care | Payer: Medicare Other | Source: Ambulatory Visit | Attending: Nurse Practitioner | Admitting: Nurse Practitioner

## 2014-03-19 DIAGNOSIS — IMO0002 Reserved for concepts with insufficient information to code with codable children: Secondary | ICD-10-CM

## 2014-03-19 DIAGNOSIS — Z1231 Encounter for screening mammogram for malignant neoplasm of breast: Secondary | ICD-10-CM

## 2014-04-08 ENCOUNTER — Ambulatory Visit (HOSPITAL_COMMUNITY)
Admission: RE | Admit: 2014-04-08 | Discharge: 2014-04-08 | Disposition: A | Discharge status: Home / Self Care | Payer: Medicare Other | Source: Ambulatory Visit | Attending: Internal Medicine | Admitting: Internal Medicine

## 2014-04-08 ENCOUNTER — Other Ambulatory Visit (HOSPITAL_BASED_OUTPATIENT_CLINIC_OR_DEPARTMENT_OTHER): Payer: Medicare Other

## 2014-04-08 ENCOUNTER — Telehealth: Payer: Self-pay | Admitting: *Deleted

## 2014-04-08 DIAGNOSIS — Z4789 Encounter for other orthopedic aftercare: Secondary | ICD-10-CM | POA: Insufficient documentation

## 2014-04-08 DIAGNOSIS — C3410 Malignant neoplasm of upper lobe, unspecified bronchus or lung: Secondary | ICD-10-CM

## 2014-04-08 DIAGNOSIS — Z9221 Personal history of antineoplastic chemotherapy: Secondary | ICD-10-CM | POA: Insufficient documentation

## 2014-04-08 DIAGNOSIS — K769 Liver disease, unspecified: Secondary | ICD-10-CM | POA: Insufficient documentation

## 2014-04-08 DIAGNOSIS — C3490 Malignant neoplasm of unspecified part of unspecified bronchus or lung: Secondary | ICD-10-CM

## 2014-04-08 DIAGNOSIS — M799 Soft tissue disorder, unspecified: Secondary | ICD-10-CM | POA: Insufficient documentation

## 2014-04-08 LAB — CBC WITH DIFFERENTIAL/PLATELET
BASO%: 1.3 % (ref 0.0–2.0)
Basophils Absolute: 0.1 10*3/uL (ref 0.0–0.1)
EOS ABS: 0.2 10*3/uL (ref 0.0–0.5)
EOS%: 3.1 % (ref 0.0–7.0)
HCT: 39.8 % (ref 34.8–46.6)
HGB: 13 g/dL (ref 11.6–15.9)
LYMPH%: 18.5 % (ref 14.0–49.7)
MCH: 27.6 pg (ref 25.1–34.0)
MCHC: 32.6 g/dL (ref 31.5–36.0)
MCV: 84.6 fL (ref 79.5–101.0)
MONO#: 0.5 10*3/uL (ref 0.1–0.9)
MONO%: 8.9 % (ref 0.0–14.0)
NEUT%: 68.2 % (ref 38.4–76.8)
NEUTROS ABS: 4.2 10*3/uL (ref 1.5–6.5)
PLATELETS: 200 10*3/uL (ref 145–400)
RBC: 4.7 10*6/uL (ref 3.70–5.45)
RDW: 15.6 % — AB (ref 11.2–14.5)
WBC: 6.2 10*3/uL (ref 3.9–10.3)
lymph#: 1.1 10*3/uL (ref 0.9–3.3)

## 2014-04-08 LAB — COMPREHENSIVE METABOLIC PANEL (CC13)
ALK PHOS: 69 U/L (ref 40–150)
ALT: 23 U/L (ref 0–55)
AST: 22 U/L (ref 5–34)
Albumin: 3.7 g/dL (ref 3.5–5.0)
Anion Gap: 9 mEq/L (ref 3–11)
BUN: 11.4 mg/dL (ref 7.0–26.0)
CO2: 28 mEq/L (ref 22–29)
Calcium: 9.2 mg/dL (ref 8.4–10.4)
Chloride: 102 mEq/L (ref 98–109)
Creatinine: 0.7 mg/dL (ref 0.6–1.1)
Glucose: 204 mg/dl — ABNORMAL HIGH (ref 70–140)
POTASSIUM: 4.1 meq/L (ref 3.5–5.1)
SODIUM: 140 meq/L (ref 136–145)
TOTAL PROTEIN: 7.5 g/dL (ref 6.4–8.3)
Total Bilirubin: 0.27 mg/dL (ref 0.20–1.20)

## 2014-04-08 MED ORDER — IOHEXOL 300 MG/ML  SOLN
100.0000 mL | Freq: Once | INTRAMUSCULAR | Status: AC | PRN
  Administered 2014-04-08: 100 mL via INTRAVENOUS

## 2014-04-08 NOTE — Telephone Encounter (Signed)
Janice Ball called reporting today's CT Chest abdomen and pelvis report is ready.  Will notify Dr. [name].

## 2014-04-15 ENCOUNTER — Ambulatory Visit (HOSPITAL_BASED_OUTPATIENT_CLINIC_OR_DEPARTMENT_OTHER): Payer: Medicare Other

## 2014-04-15 ENCOUNTER — Ambulatory Visit (HOSPITAL_BASED_OUTPATIENT_CLINIC_OR_DEPARTMENT_OTHER): Payer: Medicare Other | Admitting: Internal Medicine

## 2014-04-15 ENCOUNTER — Encounter: Payer: Self-pay | Admitting: Internal Medicine

## 2014-04-15 ENCOUNTER — Telehealth: Payer: Self-pay | Admitting: Internal Medicine

## 2014-04-15 VITALS — BP 151/73 | HR 125 | Temp 98.2°F | Resp 18 | Ht 68.0 in | Wt 182.8 lb

## 2014-04-15 DIAGNOSIS — C3410 Malignant neoplasm of upper lobe, unspecified bronchus or lung: Secondary | ICD-10-CM

## 2014-04-15 DIAGNOSIS — C3490 Malignant neoplasm of unspecified part of unspecified bronchus or lung: Secondary | ICD-10-CM

## 2014-04-15 DIAGNOSIS — J449 Chronic obstructive pulmonary disease, unspecified: Secondary | ICD-10-CM

## 2014-04-15 MED ORDER — SODIUM CHLORIDE 0.9 % IJ SOLN
10.0000 mL | INTRAMUSCULAR | Status: DC | PRN
  Administered 2014-04-15: 10 mL via INTRAVENOUS
  Filled 2014-04-15: qty 10

## 2014-04-15 MED ORDER — RIVAROXABAN (XARELTO) VTE STARTER PACK (15 & 20 MG): ORAL_TABLET | ORAL | Status: DC

## 2014-04-15 MED ORDER — HEPARIN SOD (PORK) LOCK FLUSH 100 UNIT/ML IV SOLN
500.0000 [IU] | Freq: Once | INTRAVENOUS | Status: AC
  Administered 2014-04-15: 500 [IU] via INTRAVENOUS
  Filled 2014-04-15: qty 5

## 2014-04-15 NOTE — Progress Notes (Signed)
[facility] [phone]   [phone]  OFFICE PROGRESS NOTE  [name], NP [place] [address]  DIAGNOSIS: Recurrent non-small cell lung cancer, squamous cell carcinoma initially diagnosis stage IIIa (T1 N2 MX ) in August of 2010.   PRIOR THERAPY:  #1 status post concurrent chemoradiation with weekly carboplatin and paclitaxel, last dose of chemotherapy given 08/05/2011.  #2 status post consolidation chemotherapy with carboplatin paclitaxel last dose given 10/27/2009.  #3 Concurrent chemoradiation with weekly carboplatin for an AUC of 2 and paclitaxel at 45 mg per meter squared given concurrent with radiotherapy, last dose was given 09/20/2011.  #4 Systemic chemotherapy with gemcitabine 1000 mg meter squared on days 1 and 8 every 3 weeks status post 3 cycles with stable disease, last dose was given 12/20/2011.  #5 Systemic chemotherapy again with single agent gemcitabine 1000 mg/M2 on days 1 and 8 every 3 weeks. First cycle 11/07/2013. Status post 4 cycles.  CURRENT THERAPY: Observation.  INTERVAL HISTORY: Janice Ball 58 y.o. female returns to the clinic today for routine three-month follow up visit. The patient has been observation for the last few months with no significant complaints. She is feeling better today with no fever or chills. She denied having any significant weight loss or night sweats. She has no chest pain, shortness of breath, cough or hemoptysis.  She denied any significant nausea or vomiting. She had repeat CT scan of the chest, abdomen and pelvis performed recently and she is here for evaluation and discussion of her scan results.  MEDICAL HISTORY: Past Medical History  Diagnosis Date  . Anxiety   . Hyperlipidemia   . Hypothyroidism   . COPD (chronic obstructive pulmonary disease)   . Arthritis     thumbs  . 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 Diabetes mellitus   . Lung  cancer dx'd 05/2009  . Lung cancer dx'd 09/2013    recurrence in LN    ALLERGIES:  is allergic to sulfonamide derivatives; codeine; and penicillins.  MEDICATIONS:  Current Outpatient Prescriptions  Medication Sig Dispense Refill  . acetaminophen (TYLENOL) 325 MG tablet Take 650 mg by mouth every 6 (six) hours as needed for mild pain or headache.       . albuterol (PROVENTIL HFA;VENTOLIN HFA) 108 (90 BASE) MCG/ACT inhaler Inhale 2 puffs into the lungs every 6 (six) hours as needed for wheezing or shortness of breath.  1 Inhaler  2  . albuterol (PROVENTIL) (2.5 MG/3ML) 0.083% nebulizer solution Take 3 mLs (2.5 mg total) by nebulization every 6 (six) hours as needed for wheezing or shortness of breath.  150 mL  0  . alendronate (FOSAMAX) 10 MG tablet Take 10 mg by mouth daily.       Marland Kitchen aspirin 325 MG tablet Take 325 mg by mouth every morning.       . benzonatate (TESSALON) 100 MG capsule Take 2 capsules (200 mg total) by mouth daily as needed for cough.  30 capsule  2  . budesonide-formoterol (SYMBICORT) 160-4.5 MCG/ACT inhaler Inhale 2 puffs into the lungs 2 (two) times daily.  1 Inhaler  5  . CHANTIX STARTING MONTH PAK 0.5 MG X 11 & 1 MG X 42 tablet Take 0.5 mg by mouth 2 (two) times daily.       . cholecalciferol (VITAMIN D) 1000 UNITS tablet Take 1,000 Units by mouth every morning.       . Chromium 1000 MCG  TABS Take 1 tablet by mouth every morning.       . cyanocobalamin 1000 MCG tablet Take 1,500 mcg by mouth every morning.      Marland Kitchen glimepiride (AMARYL) 2 MG tablet Take 2 mg by mouth daily with breakfast.       . guaiFENesin (MUCINEX) 600 MG 12 hr tablet Take 1,200 mg by mouth 2 (two) times daily.      Marland Kitchen HYDROcodone-acetaminophen (NORCO/VICODIN) 5-325 MG per tablet Take 1-2 tablets by mouth every 4 (four) hours as needed for moderate pain.  30 tablet  0  . insulin aspart (NOVOLOG) 100 UNIT/ML injection Inject 0-20 Units into the skin 3 (three) times daily before meals.  10 mL  11  .  ipratropium (ATROVENT) 0.02 % nebulizer solution Take 2.5 mLs (0.5 mg total) by nebulization 4 (four) times daily.  150 mL  4  . KRILL OIL PO Take 1 tablet by mouth every morning.       Marland Kitchen levothyroxine (SYNTHROID, LEVOTHROID) 125 MCG tablet Take 125 mcg by mouth daily before breakfast.       . lidocaine-prilocaine (EMLA) cream Apply 1 application topically as needed (skin numbing).      . LORazepam (ATIVAN) 1 MG tablet Take 1 mg by mouth 3 (three) times daily as needed for anxiety.       . magnesium gluconate (MAGONATE) 500 MG tablet Take 500 mg by mouth every morning.       . metFORMIN (GLUCOPHAGE) 1000 MG tablet Take 1,000 mg by mouth 2 (two) times daily with a meal.      . Multiple Vitamin (MULTIVITAMIN WITH MINERALS) TABS tablet Take 1 tablet by mouth every morning.      . prochlorperazine (COMPAZINE) 10 MG tablet Take 10 mg by mouth every 6 (six) hours as needed for nausea or vomiting.      . sertraline (ZOLOFT) 100 MG tablet Take 150 mg by mouth at bedtime. Takes 1 1/2 tabs daily for total dose of 150 mg      . spironolactone (ALDACTONE) 25 MG tablet Take 25 mg by mouth every morning.        No current facility-administered medications for this visit.    SURGICAL HISTORY:  Past Surgical History  Procedure Laterality Date  . Total abdominal hysterectomy    . Thyroidectomy, partial    . Tonsillectomy    . Tubal ligation      REVIEW OF SYSTEMS:  Constitutional: negative Eyes: negative Ears, nose, mouth, throat, and face: negative Respiratory: negative Cardiovascular: negative Gastrointestinal: negative Genitourinary:negative Integument/breast: negative Hematologic/lymphatic: negative Musculoskeletal:negative Neurological: negative Behavioral/Psych: negative Endocrine: negative Allergic/Immunologic: negative   PHYSICAL EXAMINATION: General appearance: alert, cooperative and no distress Head: Normocephalic, without obvious abnormality, atraumatic Neck: no adenopathy, no JVD,  supple, symmetrical, trachea midline and thyroid not enlarged, symmetric, no tenderness/mass/nodules Lymph nodes: Cervical, supraclavicular, and axillary nodes normal. Resp: clear to auscultation bilaterally Back: symmetric, no curvature. ROM normal. No CVA tenderness. Cardio: regular rate and rhythm, S1, S2 normal, no murmur, click, rub or gallop GI: soft, non-tender; bowel sounds normal; no masses,  no organomegaly Extremities: extremities normal, atraumatic, no cyanosis or edema Neurologic: Alert and oriented X 3, normal strength and tone. Normal symmetric reflexes. Normal coordination and gait  ECOG PERFORMANCE STATUS: 1 - Symptomatic but completely ambulatory  Blood pressure 151/73, pulse 125, temperature 98.2 F (36.8 C), temperature source Oral, resp. rate 18, height 5\' 8"  (1.727 m), weight 182 lb 12.8 oz (82.918 kg).  LABORATORY DATA: Lab  Results  Component Value Date   WBC 6.2 04/08/2014   HGB 13.0 04/08/2014   HCT 39.8 04/08/2014   MCV 84.6 04/08/2014   PLT 200 04/08/2014      Chemistry      Component Value Date/Time   NA 140 04/08/2014 1004   NA 136* 01/28/2014 0548   NA 141 04/04/2012 0945   K 4.1 04/08/2014 1004   K 4.1 01/28/2014 0548   K 4.7 04/04/2012 0945   CL 99 01/28/2014 0548   CL 101 04/06/2013 1041   CL 100 04/04/2012 0945   CO2 28 04/08/2014 1004   CO2 28 01/28/2014 0548   CO2 29 04/04/2012 0945   BUN 11.4 04/08/2014 1004   BUN 8 01/28/2014 0548   BUN 16 04/04/2012 0945   CREATININE 0.7 04/08/2014 1004   CREATININE 0.65 01/28/2014 0548   CREATININE 0.9 04/04/2012 0945      Component Value Date/Time   CALCIUM 9.2 04/08/2014 1004   CALCIUM 8.2* 01/28/2014 0548   CALCIUM 8.4 04/04/2012 0945   ALKPHOS 69 04/08/2014 1004   ALKPHOS 56 01/27/2014 0421   ALKPHOS 61 04/04/2012 0945   AST 22 04/08/2014 1004   AST 22 01/27/2014 0421   AST 31 04/04/2012 0945   ALT 23 04/08/2014 1004   ALT 34 01/27/2014 0421   ALT 30 04/04/2012 0945   BILITOT 0.27 04/08/2014 1004   BILITOT 0.3  01/27/2014 0421   BILITOT 0.50 04/04/2012 0945       RADIOGRAPHIC STUDIES: Ct Chest W Contrast  04/08/2014   CLINICAL DATA:  History of lung cancer diagnosed 05/2009. On chemotherapy.  EXAM: CT CHEST, ABDOMEN, AND PELVIS WITH CONTRAST  TECHNIQUE: Multidetector CT imaging of the chest, abdomen and pelvis was performed following the standard protocol during bolus administration of intravenous contrast.  CONTRAST:  170mL OMNIPAQUE IOHEXOL 300 MG/ML  SOLN  COMPARISON:  Chest CT 01/26/2014.  PET CT 10/24/2013.  FINDINGS:   CT CHEST FINDINGS  Right anterior chest wall Port-A-Cath is present with tip terminating in the superior vena cava. No significant interval change centrally necrotic 1.9 x 1.5 cm soft tissue mass within the right paratracheal location (image 19; series 2). Unchanged 0.8 cm subcarinal lymph node (image 26; series 2). Normal heart size. No pericardial effusion. Aorta and main pulmonary artery normal in caliber. Small filling defect along the central venous catheter within the superior vena cava (image 12; series 2).  Central airways are patent. No significant interval change in right perihilar consolidation most compatible with radiation change. Left lung is clear. No pleural effusion or pneumothorax.    CT ABDOMEN AND PELVIS FINDINGS  Liver is diffusely low in attenuation compatible with hepatic steatosis. No focal hepatic lesion is identified. Gallbladder, spleen, pancreas and left adrenal gland are unremarkable. Stable small right adrenal nodule. Kidneys enhance symmetrically with contrast. No hydronephrosis.  Normal caliber abdominal aorta with scattered calcified atherosclerotic plaque. No retroperitoneal lymphadenopathy. Urinary bladder is unremarkable.  Stool is present throughout the colon. No abnormal bowel wall thickening or evidence for bowel obstruction. No free fluid or free intraperitoneal air.  Healing right lateral fourth rib fracture. No aggressive or acute appearing osseous  lesions.    IMPRESSION: 1. Filling defect within the superior vena cava at the confluence of the right internal jugular and brachiocephalic veins adjacent to the central venous catheter, potentially representing small associated thrombus or fibrin sheath. 2. Re- demonstrated consolidation/ radiation change within the right hilar region, overall not significantly changed from recent prior  examinations. 3. Grossly similar centrally necrotic soft tissue within the right peritracheal region and subcentimeter subcarinal lymph node. 4. Hepatic steatosis These results will be called to the ordering clinician or representative by the Radiologist Assistant, and communication documented in the PACS or zVision Dashboard.   Electronically Signed   By: Lovey Newcomer M.D.   On: 04/08/2014 13:00   Mm Digital Screening Bilateral  03/20/2014   CLINICAL DATA:  Screening.  EXAM: DIGITAL SCREENING BILATERAL MAMMOGRAM WITH CAD  COMPARISON:  Previous exam(s).  ACR Breast Density Category b: There are scattered areas of fibroglandular density.  FINDINGS: There are no findings suspicious for malignancy. Images were processed with CAD.  IMPRESSION: No mammographic evidence of malignancy. A result letter of this screening mammogram will be mailed directly to the patient.  RECOMMENDATION: Screening mammogram in one year. (Code:SM-B-01Y)  BI-RADS CATEGORY  1: Negative.   Electronically Signed   By: Hassan Rowan M.D.   On: 03/20/2014 09:05   ASSESSMENT AND PLAN: This is a very pleasant 58 years old white female with recurrent non-small cell lung cancer status post concurrent chemoradiation as well as consolidation chemotherapy and she is currently on systemic chemotherapy with single agent gemcitabine status post 4 cycles. Her recent CT scan of the chest, abdomen and pelvis showed no evidence for disease progression but there was filling defect within the superior vena cava at the confluence of the right internal jugular and brachiocephalic  veins adjacent to the central venous catheter suspicious for small associated thrombus or fibrin sheath. I discussed the scan results with the patient today. I recommended for her to continue on observation for the lung cancer was repeat CT scan of the chest in 3 months. For the questionable catheter thrombus, I gave the patient the option of close monitoring versus stopping treatment with anticoagulation with Xarelto 15 mg by mouth twice a day initially for 3 weeks followed by 20 mg by mouth daily for 3 months. I discussed with the patient adverse effect of this treatment including increased risk for bleeding. She would like to proceed with the treatment as planned. I have a lengthy discussion with the patient today about her current disease status and treatment options. I gave the patient the option of continuing her systemic chemotherapy with single agent gemcitabine versus taken break off treatment for the next 2-3 months. She is interested in taken a chemotherapy holiday. She was advised to call immediately if she has any concerning symptoms in the interval. All questions were answered. The patient knows to call the clinic with any problems, questions or concerns. We can certainly see the patient much sooner if necessary.   Disclaimer: This note was dictated with voice recognition software. Similar sounding words can inadvertently be transcribed and may not be corrected upon review.

## 2014-04-15 NOTE — Telephone Encounter (Signed)
gave pt appt for lab and MD for September 2015

## 2014-04-15 NOTE — Patient Instructions (Signed)

## 2014-04-16 ENCOUNTER — Telehealth: Payer: Self-pay | Admitting: *Deleted

## 2014-04-16 ENCOUNTER — Encounter: Payer: Self-pay | Admitting: Internal Medicine

## 2014-04-16 ENCOUNTER — Encounter: Payer: Self-pay | Admitting: *Deleted

## 2014-04-16 NOTE — Telephone Encounter (Signed)
Note to ebony regarding insurance coverage of xarelto starter pack.

## 2014-04-16 NOTE — Progress Notes (Signed)
Faxed xarelto starter pack pa form to [facility]

## 2014-04-16 NOTE — Progress Notes (Signed)
RECEIVED A FAX FROM [facility] CONCERNING A PRIOR AUTHORIZATION FOR XARELTO STARTER PACK. THIS REQUEST WAS PLACED IN THE MANAGED CARE BIN.

## 2014-04-18 ENCOUNTER — Encounter: Payer: Self-pay | Admitting: Hematology and Oncology

## 2014-04-18 ENCOUNTER — Encounter: Payer: Self-pay | Admitting: Internal Medicine

## 2014-04-18 NOTE — Progress Notes (Signed)
PLEASE DISREGARD FMLA NOTE

## 2014-04-18 NOTE — Progress Notes (Signed)
Put daughter's fmla form on nurse's desk.

## 2014-04-18 NOTE — Progress Notes (Signed)
[facility], [id number], approved xarelto starter pack from 04/16/14-04/17/15

## 2014-05-20 ENCOUNTER — Other Ambulatory Visit: Payer: Self-pay | Admitting: Internal Medicine

## 2014-05-21 ENCOUNTER — Encounter: Payer: Medicare Other | Admitting: Internal Medicine

## 2014-05-21 ENCOUNTER — Telehealth: Payer: Self-pay | Admitting: *Deleted

## 2014-05-21 ENCOUNTER — Encounter: Payer: Self-pay | Admitting: Internal Medicine

## 2014-05-21 ENCOUNTER — Other Ambulatory Visit: Payer: Self-pay | Admitting: *Deleted

## 2014-05-21 DIAGNOSIS — I8290 Acute embolism and thrombosis of unspecified vein: Secondary | ICD-10-CM

## 2014-05-21 MED ORDER — RIVAROXABAN 20 MG PO TABS: 20.0000 mg | ORAL_TABLET | Freq: Every day | ORAL | Status: DC

## 2014-05-21 NOTE — Progress Notes (Signed)
Faxed xarelto pa form to [facility]

## 2014-05-21 NOTE — Telephone Encounter (Signed)
Received prior authorization request for Xarelto 20mg  tab.  Gave request to care management.

## 2014-06-10 ENCOUNTER — Telehealth: Payer: Self-pay | Admitting: Internal Medicine

## 2014-06-10 NOTE — Telephone Encounter (Signed)
pt called to sched flush...done...pt aware of d.t

## 2014-06-11 ENCOUNTER — Telehealth: Payer: Self-pay | Admitting: Internal Medicine

## 2014-06-11 NOTE — Telephone Encounter (Signed)
pt called to sched flush..done per pt requst...pt aware of new d.t

## 2014-06-11 NOTE — Telephone Encounter (Signed)
pt lmonvm to cx and r/s 8/25 flush appt - called pt to r/s but was not able to reach her - lm for pt to call back to r/s 8/25 flush appt

## 2014-06-13 ENCOUNTER — Ambulatory Visit (HOSPITAL_BASED_OUTPATIENT_CLINIC_OR_DEPARTMENT_OTHER): Payer: Medicare Other

## 2014-06-13 VITALS — BP 129/71 | HR 109 | Temp 97.9°F

## 2014-06-13 DIAGNOSIS — C3410 Malignant neoplasm of upper lobe, unspecified bronchus or lung: Secondary | ICD-10-CM

## 2014-06-13 DIAGNOSIS — Z452 Encounter for adjustment and management of vascular access device: Secondary | ICD-10-CM

## 2014-06-13 DIAGNOSIS — Z95828 Presence of other vascular implants and grafts: Secondary | ICD-10-CM

## 2014-06-13 MED ORDER — SODIUM CHLORIDE 0.9 % IJ SOLN
10.0000 mL | INTRAMUSCULAR | Status: DC | PRN
  Administered 2014-06-13: 10 mL via INTRAVENOUS
  Filled 2014-06-13: qty 10

## 2014-06-13 MED ORDER — HEPARIN SOD (PORK) LOCK FLUSH 100 UNIT/ML IV SOLN
500.0000 [IU] | Freq: Once | INTRAVENOUS | Status: AC
Start: 2014-06-13 — End: 2014-06-13
  Administered 2014-06-13: 500 [IU] via INTRAVENOUS
  Filled 2014-06-13: qty 5

## 2014-06-13 NOTE — Patient Instructions (Signed)

## 2014-07-12 ENCOUNTER — Other Ambulatory Visit: Payer: Medicare Other

## 2014-07-15 ENCOUNTER — Ambulatory Visit (HOSPITAL_COMMUNITY)
Admission: RE | Admit: 2014-07-15 | Discharge: 2014-07-15 | Disposition: A | Discharge status: Home / Self Care | Payer: Medicare Other | Source: Ambulatory Visit | Attending: Internal Medicine | Admitting: Internal Medicine

## 2014-07-15 ENCOUNTER — Other Ambulatory Visit (HOSPITAL_BASED_OUTPATIENT_CLINIC_OR_DEPARTMENT_OTHER): Payer: Medicare Other

## 2014-07-15 ENCOUNTER — Encounter (HOSPITAL_COMMUNITY): Payer: Self-pay

## 2014-07-15 DIAGNOSIS — R599 Enlarged lymph nodes, unspecified: Secondary | ICD-10-CM | POA: Diagnosis not present

## 2014-07-15 DIAGNOSIS — K7689 Other specified diseases of liver: Secondary | ICD-10-CM | POA: Diagnosis not present

## 2014-07-15 DIAGNOSIS — J90 Pleural effusion, not elsewhere classified: Secondary | ICD-10-CM | POA: Insufficient documentation

## 2014-07-15 DIAGNOSIS — M810 Age-related osteoporosis without current pathological fracture: Secondary | ICD-10-CM | POA: Insufficient documentation

## 2014-07-15 DIAGNOSIS — C3410 Malignant neoplasm of upper lobe, unspecified bronchus or lung: Secondary | ICD-10-CM

## 2014-07-15 DIAGNOSIS — E278 Other specified disorders of adrenal gland: Secondary | ICD-10-CM | POA: Diagnosis not present

## 2014-07-15 DIAGNOSIS — Z923 Personal history of irradiation: Secondary | ICD-10-CM | POA: Insufficient documentation

## 2014-07-15 DIAGNOSIS — I700 Atherosclerosis of aorta: Secondary | ICD-10-CM | POA: Diagnosis not present

## 2014-07-15 DIAGNOSIS — I2510 Atherosclerotic heart disease of native coronary artery without angina pectoris: Secondary | ICD-10-CM | POA: Insufficient documentation

## 2014-07-15 DIAGNOSIS — J9819 Other pulmonary collapse: Secondary | ICD-10-CM | POA: Diagnosis not present

## 2014-07-15 DIAGNOSIS — M4120 Other idiopathic scoliosis, site unspecified: Secondary | ICD-10-CM | POA: Diagnosis not present

## 2014-07-15 DIAGNOSIS — C3490 Malignant neoplasm of unspecified part of unspecified bronchus or lung: Secondary | ICD-10-CM

## 2014-07-15 DIAGNOSIS — R9389 Abnormal findings on diagnostic imaging of other specified body structures: Secondary | ICD-10-CM | POA: Diagnosis not present

## 2014-07-15 LAB — COMPREHENSIVE METABOLIC PANEL (CC13)
ALK PHOS: 74 U/L (ref 40–150)
ALT: 10 U/L (ref 0–55)
ANION GAP: 10 meq/L (ref 3–11)
AST: 14 U/L (ref 5–34)
Albumin: 3.4 g/dL — ABNORMAL LOW (ref 3.5–5.0)
BUN: 7.1 mg/dL (ref 7.0–26.0)
CO2: 26 mEq/L (ref 22–29)
CREATININE: 0.7 mg/dL (ref 0.6–1.1)
Calcium: 9.1 mg/dL (ref 8.4–10.4)
Chloride: 104 mEq/L (ref 98–109)
GLUCOSE: 180 mg/dL — AB (ref 70–140)
Potassium: 4.3 mEq/L (ref 3.5–5.1)
SODIUM: 141 meq/L (ref 136–145)
TOTAL PROTEIN: 7.3 g/dL (ref 6.4–8.3)
Total Bilirubin: 0.24 mg/dL (ref 0.20–1.20)

## 2014-07-15 LAB — CBC WITH DIFFERENTIAL/PLATELET
BASO%: 0.9 % (ref 0.0–2.0)
Basophils Absolute: 0.1 10*3/uL (ref 0.0–0.1)
EOS ABS: 0.2 10*3/uL (ref 0.0–0.5)
EOS%: 3 % (ref 0.0–7.0)
HEMATOCRIT: 38 % (ref 34.8–46.6)
HGB: 12.4 g/dL (ref 11.6–15.9)
LYMPH#: 1.1 10*3/uL (ref 0.9–3.3)
LYMPH%: 15.4 % (ref 14.0–49.7)
MCH: 26.8 pg (ref 25.1–34.0)
MCHC: 32.6 g/dL (ref 31.5–36.0)
MCV: 82.3 fL (ref 79.5–101.0)
MONO#: 0.6 10*3/uL (ref 0.1–0.9)
MONO%: 7.8 % (ref 0.0–14.0)
NEUT%: 72.9 % (ref 38.4–76.8)
NEUTROS ABS: 5.4 10*3/uL (ref 1.5–6.5)
Platelets: 183 10*3/uL (ref 145–400)
RBC: 4.62 10*6/uL (ref 3.70–5.45)
RDW: 14.9 % — ABNORMAL HIGH (ref 11.2–14.5)
WBC: 7.4 10*3/uL (ref 3.9–10.3)

## 2014-07-15 MED ORDER — IOHEXOL 300 MG/ML  SOLN: 100.0000 mL | Freq: Once | INTRAMUSCULAR | Status: DC | PRN

## 2014-07-15 MED ORDER — IOHEXOL 300 MG/ML  SOLN
80.0000 mL | Freq: Once | INTRAMUSCULAR | Status: AC | PRN
  Administered 2014-07-15: 80 mL via INTRAVENOUS

## 2014-07-16 ENCOUNTER — Encounter: Payer: Self-pay | Admitting: Internal Medicine

## 2014-07-16 ENCOUNTER — Telehealth: Payer: Self-pay | Admitting: *Deleted

## 2014-07-16 ENCOUNTER — Ambulatory Visit (HOSPITAL_BASED_OUTPATIENT_CLINIC_OR_DEPARTMENT_OTHER): Payer: Medicare Other | Admitting: Internal Medicine

## 2014-07-16 VITALS — BP 127/65 | HR 102 | Temp 98.0°F | Resp 20 | Ht 68.0 in | Wt 184.0 lb

## 2014-07-16 DIAGNOSIS — C3491 Malignant neoplasm of unspecified part of right bronchus or lung: Secondary | ICD-10-CM

## 2014-07-16 DIAGNOSIS — R079 Chest pain, unspecified: Secondary | ICD-10-CM

## 2014-07-16 DIAGNOSIS — C3410 Malignant neoplasm of upper lobe, unspecified bronchus or lung: Secondary | ICD-10-CM

## 2014-07-16 DIAGNOSIS — R599 Enlarged lymph nodes, unspecified: Secondary | ICD-10-CM

## 2014-07-16 MED ORDER — OXYCODONE-ACETAMINOPHEN 5-325 MG PO TABS: 1.0000 | ORAL_TABLET | Freq: Four times a day (QID) | ORAL | Status: DC | PRN

## 2014-07-16 MED ORDER — HYDROCODONE-ACETAMINOPHEN 5-325 MG PO TABS: 1.0000 | ORAL_TABLET | ORAL | Status: DC | PRN

## 2014-07-16 NOTE — Progress Notes (Signed)
[facility] [phone]   [phone]  OFFICE PROGRESS NOTE  [name], NP [place] [address]  DIAGNOSIS: Recurrent non-small cell lung cancer, squamous cell carcinoma initially diagnosis stage IIIa (T1 N2 MX ) in August of 2010.   PRIOR THERAPY:  #1 status post concurrent chemoradiation with weekly carboplatin and paclitaxel, last dose of chemotherapy given 08/05/2011.  #2 status post consolidation chemotherapy with carboplatin paclitaxel last dose given 10/27/2009.  #3 Concurrent chemoradiation with weekly carboplatin for an AUC of 2 and paclitaxel at 45 mg per meter squared given concurrent with radiotherapy, last dose was given 09/20/2011.  #4 Systemic chemotherapy with gemcitabine 1000 mg meter squared on days 1 and 8 every 3 weeks status post 3 cycles with stable disease, last dose was given 12/20/2011.  #5 Systemic chemotherapy again with single agent gemcitabine 1000 mg/M2 on days 1 and 8 every 3 weeks. First cycle 11/07/2013. Status post 4 cycles.  CURRENT THERAPY: Systemic chemotherapy again with single agent gemcitabine 1000 mg/M2 on days 1 and 8 every 3 weeks. First dose 07/24/2014.  INTERVAL HISTORY: Janice Ball 58 y.o. female returns to the clinic today for routine three-month follow up visit. The patient has been observation for the last few months with no significant complaints except for increasing chest congestion and pain in the central part of the chest as well as wheezes started few weeks ago. She denied having any fever or chills. She denied having any significant weight loss or night sweats.  She denied any significant nausea or vomiting. She had repeat CT scan of the chest, abdomen and pelvis performed recently and she is here for evaluation and discussion of her scan results.  MEDICAL HISTORY: Past Medical History  Diagnosis Date  . Anxiety   . Hyperlipidemia   . Hypothyroidism   . COPD  (chronic obstructive pulmonary disease)   . Arthritis     thumbs  . 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 Diabetes mellitus   . Lung cancer dx'd 05/2009  . Lung cancer dx'd 09/2013    recurrence in LN    ALLERGIES:  is allergic to sulfonamide derivatives; codeine; and penicillins.  MEDICATIONS:  Current Outpatient Prescriptions  Medication Sig Dispense Refill  . acetaminophen (TYLENOL) 325 MG tablet Take 650 mg by mouth every 6 (six) hours as needed for mild pain or headache.       . albuterol (PROVENTIL HFA;VENTOLIN HFA) 108 (90 BASE) MCG/ACT inhaler Inhale 2 puffs into the lungs every 6 (six) hours as needed for wheezing or shortness of breath.  1 Inhaler  2  . albuterol (PROVENTIL) (2.5 MG/3ML) 0.083% nebulizer solution Take 3 mLs (2.5 mg total) by nebulization every 6 (six) hours as needed for wheezing or shortness of breath.  150 mL  0  . alendronate (FOSAMAX) 10 MG tablet Take 10 mg by mouth daily.       Marland Kitchen aspirin 325 MG tablet Take 325 mg by mouth every morning.       . benzonatate (TESSALON) 100 MG capsule Take 2 capsules (200 mg total) by mouth daily as needed for cough.  30 capsule  2  . budesonide-formoterol (SYMBICORT) 160-4.5 MCG/ACT inhaler Inhale 2 puffs into the lungs 2 (two) times daily.  1 Inhaler  5  . CHANTIX STARTING MONTH PAK 0.5 MG X 11 & 1 MG X 42 tablet Take 0.5 mg by mouth 2 (two) times daily.       Marland Kitchen  cholecalciferol (VITAMIN D) 1000 UNITS tablet Take 1,000 Units by mouth every morning.       . Chromium 1000 MCG TABS Take 1 tablet by mouth every morning.       . cyanocobalamin 1000 MCG tablet Take 1,500 mcg by mouth every morning.      Marland Kitchen glimepiride (AMARYL) 2 MG tablet Take 2 mg by mouth daily with breakfast.       . guaiFENesin (MUCINEX) 600 MG 12 hr tablet Take 1,200 mg by mouth 2 (two) times daily.      Marland Kitchen HYDROcodone-acetaminophen (NORCO/VICODIN) 5-325 MG per tablet Take 1-2 tablets by mouth every 4 (four) hours as needed for moderate pain.  30  tablet  0  . insulin aspart (NOVOLOG) 100 UNIT/ML injection Inject 0-20 Units into the skin 3 (three) times daily before meals.  10 mL  11  . ipratropium (ATROVENT) 0.02 % nebulizer solution Take 2.5 mLs (0.5 mg total) by nebulization 4 (four) times daily.  150 mL  4  . KRILL OIL PO Take 1 tablet by mouth every morning.       Marland Kitchen levothyroxine (SYNTHROID, LEVOTHROID) 125 MCG tablet Take 125 mcg by mouth daily before breakfast.       . lidocaine-prilocaine (EMLA) cream Apply 1 application topically as needed (skin numbing).      . LORazepam (ATIVAN) 1 MG tablet Take 1 mg by mouth 3 (three) times daily as needed for anxiety.       . magnesium gluconate (MAGONATE) 500 MG tablet Take 500 mg by mouth every morning.       . metFORMIN (GLUCOPHAGE) 1000 MG tablet Take 1,000 mg by mouth 2 (two) times daily with a meal.      . Multiple Vitamin (MULTIVITAMIN WITH MINERALS) TABS tablet Take 1 tablet by mouth every morning.      . prochlorperazine (COMPAZINE) 10 MG tablet Take 10 mg by mouth every 6 (six) hours as needed for nausea or vomiting.      . rivaroxaban (XARELTO) 20 MG TABS tablet Take 1 tablet (20 mg total) by mouth daily with supper.  30 tablet  2  . sertraline (ZOLOFT) 100 MG tablet Take 150 mg by mouth at bedtime. Takes 1 1/2 tabs daily for total dose of 150 mg      . spironolactone (ALDACTONE) 25 MG tablet Take 25 mg by mouth every morning.        No current facility-administered medications for this visit.    SURGICAL HISTORY:  Past Surgical History  Procedure Laterality Date  . Total abdominal hysterectomy    . Thyroidectomy, partial    . Tonsillectomy    . Tubal ligation      REVIEW OF SYSTEMS:  Constitutional: negative Eyes: negative Ears, nose, mouth, throat, and face: negative Respiratory: negative Cardiovascular: negative Gastrointestinal: negative Genitourinary:negative Integument/breast: negative Hematologic/lymphatic: negative Musculoskeletal:negative Neurological:  negative Behavioral/Psych: negative Endocrine: negative Allergic/Immunologic: negative   PHYSICAL EXAMINATION: General appearance: alert, cooperative and no distress Head: Normocephalic, without obvious abnormality, atraumatic Neck: no adenopathy, no JVD, supple, symmetrical, trachea midline and thyroid not enlarged, symmetric, no tenderness/mass/nodules Lymph nodes: Cervical, supraclavicular, and axillary nodes normal. Resp: clear to auscultation bilaterally Back: symmetric, no curvature. ROM normal. No CVA tenderness. Cardio: regular rate and rhythm, S1, S2 normal, no murmur, click, rub or gallop GI: soft, non-tender; bowel sounds normal; no masses,  no organomegaly Extremities: extremities normal, atraumatic, no cyanosis or edema Neurologic: Alert and oriented X 3, normal strength and tone. Normal symmetric reflexes.  Normal coordination and gait  ECOG PERFORMANCE STATUS: 1 - Symptomatic but completely ambulatory  Blood pressure 127/65, pulse 102, temperature 98 F (36.7 C), temperature source Oral, resp. rate 20, height 5\' 8"  (1.727 m), weight 184 lb (83.462 kg).  LABORATORY DATA: Lab Results  Component Value Date   WBC 7.4 07/15/2014   HGB 12.4 07/15/2014   HCT 38.0 07/15/2014   MCV 82.3 07/15/2014   PLT 183 07/15/2014      Chemistry      Component Value Date/Time   NA 141 07/15/2014 0942   NA 136* 01/28/2014 0548   NA 141 04/04/2012 0945   K 4.3 07/15/2014 0942   K 4.1 01/28/2014 0548   K 4.7 04/04/2012 0945   CL 99 01/28/2014 0548   CL 101 04/06/2013 1041   CL 100 04/04/2012 0945   CO2 26 07/15/2014 0942   CO2 28 01/28/2014 0548   CO2 29 04/04/2012 0945   BUN 7.1 07/15/2014 0942   BUN 8 01/28/2014 0548   BUN 16 04/04/2012 0945   CREATININE 0.7 07/15/2014 0942   CREATININE 0.65 01/28/2014 0548   CREATININE 0.9 04/04/2012 0945      Component Value Date/Time   CALCIUM 9.1 07/15/2014 0942   CALCIUM 8.2* 01/28/2014 0548   CALCIUM 8.4 04/04/2012 0945   ALKPHOS 74 07/15/2014 0942    ALKPHOS 56 01/27/2014 0421   ALKPHOS 61 04/04/2012 0945   AST 14 07/15/2014 0942   AST 22 01/27/2014 0421   AST 31 04/04/2012 0945   ALT 10 07/15/2014 0942   ALT 34 01/27/2014 0421   ALT 30 04/04/2012 0945   BILITOT 0.24 07/15/2014 0942   BILITOT 0.3 01/27/2014 0421   BILITOT 0.50 04/04/2012 0945       RADIOGRAPHIC STUDIES: Ct Chest W Contrast  07/15/2014   CLINICAL DATA:  Restaging lung cancer.  EXAM: CT CHEST WITH CONTRAST  TECHNIQUE: Multidetector CT imaging of the chest was performed during intravenous contrast administration.  CONTRAST:  3mL OMNIPAQUE IOHEXOL 300 MG/ML  SOLN  COMPARISON:  04/08/2014 and 01/26/2013  FINDINGS: Chest wall:  No breast masses, supraclavicular or axillary lymphadenopathy. Small scattered nodes are noted. The bony thorax is intact. No destructive bone lesions. Stable scoliosis and osteoporosis.  Mediastinum:  The heart is normal in size. No pericardial effusion. The aorta is normal in caliber. Stable atherosclerotic calcifications at the aortic arch involving the major branch vessels. Coronary artery calcifications are stable. The esophagus is grossly normal.  Slight overall increase and low-attenuation soft tissue density in the precarinal area. This measures approximately 5.0 x 2.6 cm and previously measured 2.9 x 1.6 cm. This is worrisome for progressive tumor. A repeat PET-CT may be helpful for further evaluation. There is also a slightly enlarging subcarinal lymph node which previously measured 12 x 6.5 mm and now measures 14.5 x 8 mm small right hilar lymph nodes are stable. Stable dense radiation changes in the right paramediastinal along and right mediastinum.  Lungs:  Stable radiation changes involving the right lung. No obvious pulmonary lesions to suggest pulmonary metastatic disease. There is a small right pleural effusion with minimal overlying atelectasis. This is new.  Upper abdomen:  Diffuse fatty infiltration of the liver is again demonstrated. No focal hepatic  lesions are identified. Stable small right adrenal gland nodule consistent with a benign adenoma.  IMPRESSION: 1. Progressive low-attenuation soft tissue density in the mediastinum and enlarging subcarinal lymph node worrisome for progressive mediastinal tumor. A followup PET-CT may be helpful for  further evaluation. 2. Stable radiation changes involving the right paramediastinal long. 3. No findings for metastatic pulmonary disease. 4. New small right pleural effusion.   Electronically Signed   By: Kalman Jewels M.D.   On: 07/15/2014 12:24   ASSESSMENT AND PLAN: This is a very pleasant 58 years old white female with recurrent non-small cell lung cancer status post concurrent chemoradiation as well as consolidation chemotherapy and she is currently on systemic chemotherapy with single agent gemcitabine status post 4 cycles. She has been observation for the last 6 months but the recent CT scan of the chest showed evidence for disease recurrence especially in the mediastinum is enlarging subcarinal lymph node suspicious for disease progression. I discussed the scan results with the patient today. I gave her the option of repeating a PET scan for further evaluation of her disease versus proceeding with treatment as the CT scan of the chest is very clear for the disease recurrence. The patient doesn't want to waste any more time money for the PET scan. She would like to resume her systemic chemotherapy. I gave her the option of treatment with immunotherapy with Nivolumab versus resuming her previous chemotherapy with single agent gemcitabine. The patient would like to resume her treatment with gemcitabine. This will be given at a dose of 1000 mg/M2 on days 1 and 8 every 3 weeks. I reminded the patient was adverse effect of this treatment including but not limited to alopecia, myelosuppression, nausea and vomiting, liver or renal dysfunction. She is expected to start the first cycle of this treatment on  07/24/2014. She will come back for followup visit in 4 weeks with the start of cycle #2. 4 pain management she was given prescription for Percocet 5/325 mg by mouth every 6 hours as needed. She was advised to call immediately if she has any concerning symptoms in the interval.  All questions were answered. The patient knows to call the clinic with any problems, questions or concerns. We can certainly see the patient much sooner if necessary.   Disclaimer: This note was dictated with voice recognition software. Similar sounding words can inadvertently be transcribed and may not be corrected upon review.

## 2014-07-16 NOTE — Telephone Encounter (Signed)
Per staff message and POF I have scheduled appts. Advised scheduler of appts. JMW  

## 2014-07-24 ENCOUNTER — Ambulatory Visit (HOSPITAL_BASED_OUTPATIENT_CLINIC_OR_DEPARTMENT_OTHER): Payer: Medicare Other

## 2014-07-24 ENCOUNTER — Other Ambulatory Visit (HOSPITAL_BASED_OUTPATIENT_CLINIC_OR_DEPARTMENT_OTHER): Payer: Medicare Other

## 2014-07-24 VITALS — BP 131/52 | HR 102 | Temp 98.4°F | Resp 18

## 2014-07-24 DIAGNOSIS — Z5111 Encounter for antineoplastic chemotherapy: Secondary | ICD-10-CM

## 2014-07-24 DIAGNOSIS — C3411 Malignant neoplasm of upper lobe, right bronchus or lung: Secondary | ICD-10-CM

## 2014-07-24 DIAGNOSIS — C3490 Malignant neoplasm of unspecified part of unspecified bronchus or lung: Secondary | ICD-10-CM

## 2014-07-24 LAB — COMPREHENSIVE METABOLIC PANEL (CC13)
ALT: 11 U/L (ref 0–55)
ANION GAP: 9 meq/L (ref 3–11)
AST: 13 U/L (ref 5–34)
Albumin: 3.3 g/dL — ABNORMAL LOW (ref 3.5–5.0)
Alkaline Phosphatase: 77 U/L (ref 40–150)
BUN: 8.5 mg/dL (ref 7.0–26.0)
CHLORIDE: 103 meq/L (ref 98–109)
CO2: 26 meq/L (ref 22–29)
CREATININE: 0.7 mg/dL (ref 0.6–1.1)
Calcium: 8.8 mg/dL (ref 8.4–10.4)
Glucose: 235 mg/dl — ABNORMAL HIGH (ref 70–140)
Potassium: 4.2 mEq/L (ref 3.5–5.1)
Sodium: 138 mEq/L (ref 136–145)
Total Bilirubin: 0.25 mg/dL (ref 0.20–1.20)
Total Protein: 7.2 g/dL (ref 6.4–8.3)

## 2014-07-24 LAB — CBC WITH DIFFERENTIAL/PLATELET
BASO%: 0.7 % (ref 0.0–2.0)
BASOS ABS: 0.1 10*3/uL (ref 0.0–0.1)
EOS%: 2.3 % (ref 0.0–7.0)
Eosinophils Absolute: 0.2 10*3/uL (ref 0.0–0.5)
HEMATOCRIT: 36.9 % (ref 34.8–46.6)
HEMOGLOBIN: 12 g/dL (ref 11.6–15.9)
LYMPH#: 1 10*3/uL (ref 0.9–3.3)
LYMPH%: 14.7 % (ref 14.0–49.7)
MCH: 26.7 pg (ref 25.1–34.0)
MCHC: 32.5 g/dL (ref 31.5–36.0)
MCV: 82 fL (ref 79.5–101.0)
MONO#: 0.5 10*3/uL (ref 0.1–0.9)
MONO%: 6.6 % (ref 0.0–14.0)
NEUT#: 5.3 10*3/uL (ref 1.5–6.5)
NEUT%: 75.7 % (ref 38.4–76.8)
PLATELETS: 177 10*3/uL (ref 145–400)
RBC: 4.5 10*6/uL (ref 3.70–5.45)
RDW: 14.9 % — ABNORMAL HIGH (ref 11.2–14.5)
WBC: 6.9 10*3/uL (ref 3.9–10.3)

## 2014-07-24 MED ORDER — PROCHLORPERAZINE MALEATE 10 MG PO TABS
10.0000 mg | ORAL_TABLET | Freq: Once | ORAL | Status: AC
  Administered 2014-07-24: 10 mg via ORAL

## 2014-07-24 MED ORDER — PROCHLORPERAZINE MALEATE 10 MG PO TABS
ORAL_TABLET | ORAL | Status: AC
  Filled 2014-07-24: qty 1

## 2014-07-24 MED ORDER — SODIUM CHLORIDE 0.9 % IV SOLN
1000.0000 mg/m2 | Freq: Once | INTRAVENOUS | Status: AC
  Administered 2014-07-24: 2014 mg via INTRAVENOUS
  Filled 2014-07-24: qty 52.97

## 2014-07-24 MED ORDER — SODIUM CHLORIDE 0.9 % IJ SOLN
10.0000 mL | INTRAMUSCULAR | Status: DC | PRN
  Administered 2014-07-24: 10 mL
  Filled 2014-07-24: qty 10

## 2014-07-24 MED ORDER — SODIUM CHLORIDE 0.9 % IV SOLN
Freq: Once | INTRAVENOUS | Status: AC
  Administered 2014-07-24: 13:00:00 via INTRAVENOUS

## 2014-07-24 MED ORDER — HEPARIN SOD (PORK) LOCK FLUSH 100 UNIT/ML IV SOLN
500.0000 [IU] | Freq: Once | INTRAVENOUS | Status: AC | PRN
Start: 2014-07-24 — End: 2014-07-24
  Administered 2014-07-24: 500 [IU]
  Filled 2014-07-24: qty 5

## 2014-07-24 NOTE — Patient Instructions (Signed)
[facility] Discharge Instructions for Patients Receiving Chemotherapy  Today you received the following chemotherapy agents: gemzar  To help prevent nausea and vomiting after your treatment, we encourage you to take your nausea medication.  Take it as often as prescribed.     If you develop nausea and vomiting that is not controlled by your nausea medication, call the clinic. If it is after clinic hours your family physician or the after hours number for the clinic or go to the Emergency Department.   BELOW ARE SYMPTOMS THAT SHOULD BE REPORTED IMMEDIATELY:  *FEVER GREATER THAN 100.5 F  *CHILLS WITH OR WITHOUT FEVER  NAUSEA AND VOMITING THAT IS NOT CONTROLLED WITH YOUR NAUSEA MEDICATION  *UNUSUAL SHORTNESS OF BREATH  *UNUSUAL BRUISING OR BLEEDING  TENDERNESS IN MOUTH AND THROAT WITH OR WITHOUT PRESENCE OF ULCERS  *URINARY PROBLEMS  *BOWEL PROBLEMS  UNUSUAL RASH Items with * indicate a potential emergency and should be followed up as soon as possible.  Feel free to call the clinic you have any questions or concerns. The clinic phone number is [phone].   I have been informed and understand all the instructions given to me. I know to contact the clinic, my physician, or go to the Emergency Department if any problems should occur. I do not have any questions at this time, but understand that I may call the clinic during office hours   should I have any questions or need assistance in obtaining follow up care.    __________________________________________  _____________  __________ Signature of Patient or Authorized Representative            Date                   Time    __________________________________________ Nurse's Signature

## 2014-07-31 ENCOUNTER — Other Ambulatory Visit (HOSPITAL_BASED_OUTPATIENT_CLINIC_OR_DEPARTMENT_OTHER): Payer: Medicare Other

## 2014-07-31 ENCOUNTER — Ambulatory Visit (HOSPITAL_BASED_OUTPATIENT_CLINIC_OR_DEPARTMENT_OTHER): Payer: Medicare Other

## 2014-07-31 VITALS — BP 130/50 | HR 98 | Temp 98.5°F | Resp 18

## 2014-07-31 DIAGNOSIS — C3411 Malignant neoplasm of upper lobe, right bronchus or lung: Secondary | ICD-10-CM

## 2014-07-31 DIAGNOSIS — Z5111 Encounter for antineoplastic chemotherapy: Secondary | ICD-10-CM

## 2014-07-31 DIAGNOSIS — C3490 Malignant neoplasm of unspecified part of unspecified bronchus or lung: Secondary | ICD-10-CM

## 2014-07-31 LAB — COMPREHENSIVE METABOLIC PANEL (CC13)
ALBUMIN: 3.4 g/dL — AB (ref 3.5–5.0)
ALT: 40 U/L (ref 0–55)
AST: 33 U/L (ref 5–34)
Alkaline Phosphatase: 78 U/L (ref 40–150)
Anion Gap: 11 mEq/L (ref 3–11)
BUN: 6.3 mg/dL — AB (ref 7.0–26.0)
CALCIUM: 8.9 mg/dL (ref 8.4–10.4)
CO2: 23 mEq/L (ref 22–29)
Chloride: 104 mEq/L (ref 98–109)
Creatinine: 0.7 mg/dL (ref 0.6–1.1)
Glucose: 187 mg/dl — ABNORMAL HIGH (ref 70–140)
POTASSIUM: 4.1 meq/L (ref 3.5–5.1)
Sodium: 138 mEq/L (ref 136–145)
Total Bilirubin: 0.27 mg/dL (ref 0.20–1.20)
Total Protein: 7.4 g/dL (ref 6.4–8.3)

## 2014-07-31 LAB — CBC WITH DIFFERENTIAL/PLATELET
BASO%: 1.4 % (ref 0.0–2.0)
BASOS ABS: 0.1 10*3/uL (ref 0.0–0.1)
EOS ABS: 0 10*3/uL (ref 0.0–0.5)
EOS%: 0.7 % (ref 0.0–7.0)
HCT: 35.9 % (ref 34.8–46.6)
HEMOGLOBIN: 11.5 g/dL — AB (ref 11.6–15.9)
LYMPH%: 24.7 % (ref 14.0–49.7)
MCH: 26.1 pg (ref 25.1–34.0)
MCHC: 32 g/dL (ref 31.5–36.0)
MCV: 81.6 fL (ref 79.5–101.0)
MONO#: 0.4 10*3/uL (ref 0.1–0.9)
MONO%: 9.8 % (ref 0.0–14.0)
NEUT%: 63.4 % (ref 38.4–76.8)
NEUTROS ABS: 2.5 10*3/uL (ref 1.5–6.5)
Platelets: 160 10*3/uL (ref 145–400)
RBC: 4.4 10*6/uL (ref 3.70–5.45)
RDW: 15 % — AB (ref 11.2–14.5)
WBC: 4 10*3/uL (ref 3.9–10.3)
lymph#: 1 10*3/uL (ref 0.9–3.3)

## 2014-07-31 MED ORDER — PROCHLORPERAZINE MALEATE 10 MG PO TABS
ORAL_TABLET | ORAL | Status: AC
  Filled 2014-07-31: qty 1

## 2014-07-31 MED ORDER — SODIUM CHLORIDE 0.9 % IV SOLN
Freq: Once | INTRAVENOUS | Status: AC
  Administered 2014-07-31: 13:00:00 via INTRAVENOUS

## 2014-07-31 MED ORDER — SODIUM CHLORIDE 0.9 % IJ SOLN
10.0000 mL | INTRAMUSCULAR | Status: DC | PRN
  Administered 2014-07-31: 10 mL
  Filled 2014-07-31: qty 10

## 2014-07-31 MED ORDER — SODIUM CHLORIDE 0.9 % IV SOLN
1000.0000 mg/m2 | Freq: Once | INTRAVENOUS | Status: AC
  Administered 2014-07-31: 2014 mg via INTRAVENOUS
  Filled 2014-07-31: qty 52.97

## 2014-07-31 MED ORDER — HEPARIN SOD (PORK) LOCK FLUSH 100 UNIT/ML IV SOLN
500.0000 [IU] | Freq: Once | INTRAVENOUS | Status: AC | PRN
  Administered 2014-07-31: 500 [IU]
  Filled 2014-07-31: qty 5

## 2014-07-31 MED ORDER — PROCHLORPERAZINE MALEATE 10 MG PO TABS
10.0000 mg | ORAL_TABLET | Freq: Once | ORAL | Status: AC
  Administered 2014-07-31: 10 mg via ORAL

## 2014-07-31 NOTE — Patient Instructions (Signed)
[facility] Discharge Instructions for Patients Receiving Chemotherapy  Today you received the following chemotherapy agents Gemzar.  To help prevent nausea and vomiting after your treatment, we encourage you to take your nausea medication as prescribed.   If you develop nausea and vomiting that is not controlled by your nausea medication, call the clinic.   BELOW ARE SYMPTOMS THAT SHOULD BE REPORTED IMMEDIATELY:  *FEVER GREATER THAN 100.5 F  *CHILLS WITH OR WITHOUT FEVER  NAUSEA AND VOMITING THAT IS NOT CONTROLLED WITH YOUR NAUSEA MEDICATION  *UNUSUAL SHORTNESS OF BREATH  *UNUSUAL BRUISING OR BLEEDING  TENDERNESS IN MOUTH AND THROAT WITH OR WITHOUT PRESENCE OF ULCERS  *URINARY PROBLEMS  *BOWEL PROBLEMS  UNUSUAL RASH Items with * indicate a potential emergency and should be followed up as soon as possible.  Feel free to call the clinic you have any questions or concerns. The clinic phone number is [phone].

## 2014-08-01 ENCOUNTER — Other Ambulatory Visit: Payer: Self-pay | Admitting: *Deleted

## 2014-08-01 DIAGNOSIS — C3491 Malignant neoplasm of unspecified part of right bronchus or lung: Secondary | ICD-10-CM

## 2014-08-01 MED ORDER — PROCHLORPERAZINE MALEATE 10 MG PO TABS: 10.0000 mg | ORAL_TABLET | Freq: Four times a day (QID) | ORAL | Status: DC | PRN

## 2014-08-07 ENCOUNTER — Other Ambulatory Visit (HOSPITAL_BASED_OUTPATIENT_CLINIC_OR_DEPARTMENT_OTHER): Payer: Medicare Other

## 2014-08-07 DIAGNOSIS — C3491 Malignant neoplasm of unspecified part of right bronchus or lung: Secondary | ICD-10-CM

## 2014-08-07 DIAGNOSIS — C3411 Malignant neoplasm of upper lobe, right bronchus or lung: Secondary | ICD-10-CM

## 2014-08-07 LAB — COMPREHENSIVE METABOLIC PANEL (CC13)
ALT: 27 U/L (ref 0–55)
AST: 23 U/L (ref 5–34)
Albumin: 3.4 g/dL — ABNORMAL LOW (ref 3.5–5.0)
Alkaline Phosphatase: 69 U/L (ref 40–150)
Anion Gap: 10 mEq/L (ref 3–11)
BILIRUBIN TOTAL: 0.26 mg/dL (ref 0.20–1.20)
BUN: 9.9 mg/dL (ref 7.0–26.0)
CHLORIDE: 101 meq/L (ref 98–109)
CO2: 27 mEq/L (ref 22–29)
Calcium: 8.9 mg/dL (ref 8.4–10.4)
Creatinine: 0.7 mg/dL (ref 0.6–1.1)
Glucose: 152 mg/dl — ABNORMAL HIGH (ref 70–140)
Potassium: 4.2 mEq/L (ref 3.5–5.1)
SODIUM: 139 meq/L (ref 136–145)
Total Protein: 7 g/dL (ref 6.4–8.3)

## 2014-08-07 LAB — CBC WITH DIFFERENTIAL/PLATELET
BASO%: 0.7 % (ref 0.0–2.0)
Basophils Absolute: 0 10*3/uL (ref 0.0–0.1)
EOS ABS: 0 10*3/uL (ref 0.0–0.5)
EOS%: 0.4 % (ref 0.0–7.0)
HCT: 34.1 % — ABNORMAL LOW (ref 34.8–46.6)
HGB: 11.5 g/dL — ABNORMAL LOW (ref 11.6–15.9)
LYMPH%: 33 % (ref 14.0–49.7)
MCH: 27.2 pg (ref 25.1–34.0)
MCHC: 33.7 g/dL (ref 31.5–36.0)
MCV: 80.6 fL (ref 79.5–101.0)
MONO#: 0.3 10*3/uL (ref 0.1–0.9)
MONO%: 10 % (ref 0.0–14.0)
NEUT%: 55.9 % (ref 38.4–76.8)
NEUTROS ABS: 1.5 10*3/uL (ref 1.5–6.5)
NRBC: 0 % (ref 0–0)
Platelets: 72 10*3/uL — ABNORMAL LOW (ref 145–400)
RBC: 4.23 10*6/uL (ref 3.70–5.45)
RDW: 14.7 % — AB (ref 11.2–14.5)
WBC: 2.7 10*3/uL — ABNORMAL LOW (ref 3.9–10.3)
lymph#: 0.9 10*3/uL (ref 0.9–3.3)

## 2014-08-14 ENCOUNTER — Telehealth: Payer: Self-pay | Admitting: *Deleted

## 2014-08-14 ENCOUNTER — Other Ambulatory Visit (HOSPITAL_BASED_OUTPATIENT_CLINIC_OR_DEPARTMENT_OTHER): Payer: Medicare Other

## 2014-08-14 ENCOUNTER — Encounter: Payer: Self-pay | Admitting: Physician Assistant

## 2014-08-14 ENCOUNTER — Ambulatory Visit (HOSPITAL_BASED_OUTPATIENT_CLINIC_OR_DEPARTMENT_OTHER): Payer: Medicare Other | Admitting: Physician Assistant

## 2014-08-14 ENCOUNTER — Ambulatory Visit (HOSPITAL_BASED_OUTPATIENT_CLINIC_OR_DEPARTMENT_OTHER): Payer: Medicare Other

## 2014-08-14 ENCOUNTER — Telehealth: Payer: Self-pay | Admitting: Physician Assistant

## 2014-08-14 VITALS — BP 129/61 | HR 116 | Temp 98.5°F | Resp 18 | Ht 68.0 in | Wt 181.8 lb

## 2014-08-14 DIAGNOSIS — R5383 Other fatigue: Secondary | ICD-10-CM

## 2014-08-14 DIAGNOSIS — R0789 Other chest pain: Secondary | ICD-10-CM

## 2014-08-14 DIAGNOSIS — Z5111 Encounter for antineoplastic chemotherapy: Secondary | ICD-10-CM

## 2014-08-14 DIAGNOSIS — R079 Chest pain, unspecified: Secondary | ICD-10-CM

## 2014-08-14 DIAGNOSIS — C3490 Malignant neoplasm of unspecified part of unspecified bronchus or lung: Secondary | ICD-10-CM

## 2014-08-14 DIAGNOSIS — R0602 Shortness of breath: Secondary | ICD-10-CM

## 2014-08-14 DIAGNOSIS — C3411 Malignant neoplasm of upper lobe, right bronchus or lung: Secondary | ICD-10-CM

## 2014-08-14 DIAGNOSIS — C3491 Malignant neoplasm of unspecified part of right bronchus or lung: Secondary | ICD-10-CM

## 2014-08-14 LAB — COMPREHENSIVE METABOLIC PANEL (CC13)
ALK PHOS: 73 U/L (ref 40–150)
ALT: 28 U/L (ref 0–55)
AST: 31 U/L (ref 5–34)
Albumin: 3.6 g/dL (ref 3.5–5.0)
Anion Gap: 10 mEq/L (ref 3–11)
BILIRUBIN TOTAL: 0.28 mg/dL (ref 0.20–1.20)
BUN: 7.9 mg/dL (ref 7.0–26.0)
CO2: 26 mEq/L (ref 22–29)
CREATININE: 0.8 mg/dL (ref 0.6–1.1)
Calcium: 8.8 mg/dL (ref 8.4–10.4)
Chloride: 103 mEq/L (ref 98–109)
Glucose: 222 mg/dl — ABNORMAL HIGH (ref 70–140)
Potassium: 4.1 mEq/L (ref 3.5–5.1)
Sodium: 139 mEq/L (ref 136–145)
Total Protein: 7.2 g/dL (ref 6.4–8.3)

## 2014-08-14 LAB — CBC WITH DIFFERENTIAL/PLATELET
BASO%: 0.7 % (ref 0.0–2.0)
BASOS ABS: 0 10*3/uL (ref 0.0–0.1)
EOS%: 2.4 % (ref 0.0–7.0)
Eosinophils Absolute: 0.1 10*3/uL (ref 0.0–0.5)
HEMATOCRIT: 36.4 % (ref 34.8–46.6)
HEMOGLOBIN: 11.7 g/dL (ref 11.6–15.9)
LYMPH%: 18.1 % (ref 14.0–49.7)
MCH: 27.1 pg (ref 25.1–34.0)
MCHC: 32.1 g/dL (ref 31.5–36.0)
MCV: 84.3 fL (ref 79.5–101.0)
MONO#: 0.5 10*3/uL (ref 0.1–0.9)
MONO%: 8.4 % (ref 0.0–14.0)
NEUT#: 3.8 10*3/uL (ref 1.5–6.5)
NEUT%: 70.4 % (ref 38.4–76.8)
Platelets: 314 10*3/uL (ref 145–400)
RBC: 4.32 10*6/uL (ref 3.70–5.45)
RDW: 16.9 % — ABNORMAL HIGH (ref 11.2–14.5)
WBC: 5.5 10*3/uL (ref 3.9–10.3)
lymph#: 1 10*3/uL (ref 0.9–3.3)

## 2014-08-14 MED ORDER — OXYCODONE-ACETAMINOPHEN 5-325 MG PO TABS: 1.0000 | ORAL_TABLET | Freq: Four times a day (QID) | ORAL | Status: DC | PRN

## 2014-08-14 MED ORDER — SODIUM CHLORIDE 0.9 % IV SOLN
Freq: Once | INTRAVENOUS | Status: AC
  Administered 2014-08-14: 13:00:00 via INTRAVENOUS

## 2014-08-14 MED ORDER — SODIUM CHLORIDE 0.9 % IV SOLN
1000.0000 mg/m2 | Freq: Once | INTRAVENOUS | Status: AC
  Administered 2014-08-14: 2014 mg via INTRAVENOUS
  Filled 2014-08-14: qty 52.97

## 2014-08-14 MED ORDER — HEPARIN SOD (PORK) LOCK FLUSH 100 UNIT/ML IV SOLN
500.0000 [IU] | Freq: Once | INTRAVENOUS | Status: AC | PRN
  Administered 2014-08-14: 500 [IU]
  Filled 2014-08-14: qty 5

## 2014-08-14 MED ORDER — SODIUM CHLORIDE 0.9 % IJ SOLN
10.0000 mL | INTRAMUSCULAR | Status: DC | PRN
  Administered 2014-08-14: 10 mL
  Filled 2014-08-14: qty 10

## 2014-08-14 MED ORDER — PROCHLORPERAZINE MALEATE 10 MG PO TABS
10.0000 mg | ORAL_TABLET | Freq: Once | ORAL | Status: AC
  Administered 2014-08-14: 10 mg via ORAL

## 2014-08-14 MED ORDER — PROCHLORPERAZINE MALEATE 10 MG PO TABS
ORAL_TABLET | ORAL | Status: AC
  Filled 2014-08-14: qty 1

## 2014-08-14 NOTE — Telephone Encounter (Signed)
Per staff message and POF I have scheduled appts. Advised scheduler of appts. JMW  

## 2014-08-14 NOTE — Patient Instructions (Signed)
[facility] Discharge Instructions for Patients Receiving Chemotherapy  Today you received the following chemotherapy agents Gemzar.  To help prevent nausea and vomiting after your treatment, we encourage you to take your nausea medication as prescribed.   If you develop nausea and vomiting that is not controlled by your nausea medication, call the clinic.   BELOW ARE SYMPTOMS THAT SHOULD BE REPORTED IMMEDIATELY:  *FEVER GREATER THAN 100.5 F  *CHILLS WITH OR WITHOUT FEVER  NAUSEA AND VOMITING THAT IS NOT CONTROLLED WITH YOUR NAUSEA MEDICATION  *UNUSUAL SHORTNESS OF BREATH  *UNUSUAL BRUISING OR BLEEDING  TENDERNESS IN MOUTH AND THROAT WITH OR WITHOUT PRESENCE OF ULCERS  *URINARY PROBLEMS  *BOWEL PROBLEMS  UNUSUAL RASH Items with * indicate a potential emergency and should be followed up as soon as possible.  Feel free to call the clinic you have any questions or concerns. The clinic phone number is [phone].

## 2014-08-14 NOTE — Progress Notes (Addendum)
[place] [phone]   [phone]  OFFICE PROGRESS NOTE  [name], NP [place] [address]  DIAGNOSIS: Recurrent non-small cell lung cancer, squamous cell carcinoma initially diagnosis stage IIIa (T1 N2 MX ) in August of 2010.   PRIOR THERAPY:  #1 status post concurrent chemoradiation with weekly carboplatin and paclitaxel, last dose of chemotherapy given 08/05/2011.  #2 status post consolidation chemotherapy with carboplatin paclitaxel last dose given 10/27/2009.  #3 Concurrent chemoradiation with weekly carboplatin for an AUC of 2 and paclitaxel at 45 mg per meter squared given concurrent with radiotherapy, last dose was given 09/20/2011.  #4 Systemic chemotherapy with gemcitabine 1000 mg meter squared on days 1 and 8 every 3 weeks status post 3 cycles with stable disease, last dose was given 12/20/2011.  #5 Systemic chemotherapy again with single agent gemcitabine 1000 mg/M2 on days 1 and 8 every 3 weeks. First cycle 11/07/2013. Status post 4 cycles.  CURRENT THERAPY: Systemic chemotherapy again with single agent gemcitabine 1000 mg/M2 on days 1 and 8 every 3 weeks. First dose 07/24/2014.  INTERVAL HISTORY: Janice Ball 58 y.o. female returns to the clinic today for routinea symptom management visit. She resumed systemic chemotherapy with single agent gemcitabine. She is tolerating chemotherapy relatively well the exception of some nausea that is well-managed with Compazine. She reports some occasional low-grade temperatures with a MAXIMUM TEMPERATURE of 100. She has a cough that is nonproductive. She does report some occasional mid chest pain that is occasionally "like a hot poker like pain in the heart area. It lasts approximately 5 minutes or less and does not radiate to the jaw or left arm. She denied any diaphoresis or nausea. Her last EKG was in the spring and was normal as far she knows. She denies any cardiac  history but does have history of diabetes.  She denied having any significant weight loss or night sweats.  She denied any significant nausea or vomiting. She presents to proceed with start of cycle #2 of her resumed chemotherapy of single agent gemcitabine  MEDICAL HISTORY: Past Medical History  Diagnosis Date  . Anxiety   . Hyperlipidemia   . Hypothyroidism   . COPD (chronic obstructive pulmonary disease)   . Arthritis     thumbs  . 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 Diabetes mellitus   . Lung cancer dx'd 05/2009  . Lung cancer dx'd 09/2013    recurrence in LN    ALLERGIES:  is allergic to sulfonamide derivatives; codeine; penicillins; and vicodin.  MEDICATIONS:  Current Outpatient Prescriptions  Medication Sig Dispense Refill  . acetaminophen (TYLENOL) 325 MG tablet Take 650 mg by mouth every 6 (six) hours as needed for mild pain or headache.       . albuterol (PROVENTIL HFA;VENTOLIN HFA) 108 (90 BASE) MCG/ACT inhaler Inhale 2 puffs into the lungs every 6 (six) hours as needed for wheezing or shortness of breath.  1 Inhaler  2  . alendronate (FOSAMAX) 10 MG tablet Take 10 mg by mouth daily.       Marland Kitchen aspirin 325 MG tablet Take 325 mg by mouth every morning.       . budesonide-formoterol (SYMBICORT) 160-4.5 MCG/ACT inhaler Inhale 2 puffs into the lungs 2 (two) times daily.  1 Inhaler  5  . CHANTIX STARTING MONTH PAK 0.5 MG X 11 & 1 MG X 42 tablet Take 0.5 mg by mouth 2 (two) times daily.       Marland Kitchen  cholecalciferol (VITAMIN D) 1000 UNITS tablet Take 1,000 Units by mouth every morning.       . Chromium 1000 MCG TABS Take 1 tablet by mouth every morning.       . cyanocobalamin 1000 MCG tablet Take 1,500 mcg by mouth every morning.      Marland Kitchen glimepiride (AMARYL) 2 MG tablet Take 2 mg by mouth daily with breakfast.       . guaiFENesin (MUCINEX) 600 MG 12 hr tablet Take 1,200 mg by mouth 2 (two) times daily.      Marland Kitchen ipratropium (ATROVENT) 0.02 % nebulizer solution Take 2.5 mLs  (0.5 mg total) by nebulization 4 (four) times daily.  150 mL  4  . KRILL OIL PO Take 1 tablet by mouth every morning.       Marland Kitchen levothyroxine (SYNTHROID, LEVOTHROID) 125 MCG tablet Take 125 mcg by mouth daily before breakfast.       . lidocaine-prilocaine (EMLA) cream Apply 1 application topically as needed (skin numbing).      . LORazepam (ATIVAN) 1 MG tablet Take 1 mg by mouth 3 (three) times daily as needed for anxiety.       . magnesium gluconate (MAGONATE) 500 MG tablet Take 500 mg by mouth every morning.       . metFORMIN (GLUCOPHAGE) 1000 MG tablet Take 1,000 mg by mouth 2 (two) times daily with a meal.      . Multiple Vitamin (MULTIVITAMIN WITH MINERALS) TABS tablet Take 1 tablet by mouth every morning.      Marland Kitchen oxyCODONE-acetaminophen (PERCOCET/ROXICET) 5-325 MG per tablet Take 1 tablet by mouth every 6 (six) hours as needed for severe pain.  30 tablet  0  . prochlorperazine (COMPAZINE) 10 MG tablet Take 1 tablet (10 mg total) by mouth every 6 (six) hours as needed for nausea or vomiting.  30 tablet  0  . sertraline (ZOLOFT) 100 MG tablet Take 150 mg by mouth at bedtime. Takes 1 1/2 tabs daily for total dose of 150 mg      . spironolactone (ALDACTONE) 25 MG tablet Take 25 mg by mouth every morning.       Marland Kitchen albuterol (PROVENTIL) (2.5 MG/3ML) 0.083% nebulizer solution Take 3 mLs (2.5 mg total) by nebulization every 6 (six) hours as needed for wheezing or shortness of breath.  150 mL  0  . benzonatate (TESSALON) 100 MG capsule Take 2 capsules (200 mg total) by mouth daily as needed for cough.  30 capsule  2  . insulin aspart (NOVOLOG) 100 UNIT/ML injection Inject 0-20 Units into the skin 3 (three) times daily before meals.  10 mL  11  . predniSONE (DELTASONE) 10 MG tablet       . rivaroxaban (XARELTO) 20 MG TABS tablet Take 1 tablet (20 mg total) by mouth daily with supper.  30 tablet  2   No current facility-administered medications for this visit.   Facility-Administered Medications Ordered  in Other Visits  Medication Dose Route Frequency Provider Last Rate Last Dose  . sodium chloride 0.9 % injection 10 mL  10 mL Intracatheter PRN Curt Bears, MD   10 mL at 08/14/14 1429    SURGICAL HISTORY:  Past Surgical History  Procedure Laterality Date  . Total abdominal hysterectomy    . Thyroidectomy, partial    . Tonsillectomy    . Tubal ligation      REVIEW OF SYSTEMS:  Constitutional: negative Eyes: negative Ears, nose, mouth, throat, and face: negative Respiratory: negative Cardiovascular: positive  for chest pain Gastrointestinal: negative Genitourinary:negative Integument/breast: negative Hematologic/lymphatic: negative Musculoskeletal:negative Neurological: negative Behavioral/Psych: negative Endocrine: negative Allergic/Immunologic: negative   PHYSICAL EXAMINATION: General appearance: alert, cooperative and no distress Head: Normocephalic, without obvious abnormality, atraumatic Neck: no adenopathy, no JVD, supple, symmetrical, trachea midline and thyroid not enlarged, symmetric, no tenderness/mass/nodules Lymph nodes: Cervical, supraclavicular, and axillary nodes normal. Resp: clear to auscultation bilaterally Back: symmetric, no curvature. ROM normal. No CVA tenderness. Cardio: regular rate and rhythm, S1, S2 normal, no murmur, click, rub or gallop GI: soft, non-tender; bowel sounds normal; no masses,  no organomegaly Extremities: extremities normal, atraumatic, no cyanosis or edema Neurologic: Alert and oriented X 3, normal strength and tone. Normal symmetric reflexes. Normal coordination and gait  ECOG PERFORMANCE STATUS: 1 - Symptomatic but completely ambulatory  Blood pressure 129/61, pulse 116, temperature 98.5 F (36.9 C), temperature source Oral, resp. rate 18, height 5\' 8"  (1.727 m), weight 181 lb 12.8 oz (82.464 kg), SpO2 95.00%.  LABORATORY DATA: Lab Results  Component Value Date   WBC 5.5 08/14/2014   HGB 11.7 08/14/2014   HCT 36.4  08/14/2014   MCV 84.3 08/14/2014   PLT 314 08/14/2014      Chemistry      Component Value Date/Time   NA 139 08/14/2014 1128   NA 136* 01/28/2014 0548   NA 141 04/04/2012 0945   K 4.1 08/14/2014 1128   K 4.1 01/28/2014 0548   K 4.7 04/04/2012 0945   CL 99 01/28/2014 0548   CL 101 04/06/2013 1041   CL 100 04/04/2012 0945   CO2 26 08/14/2014 1128   CO2 28 01/28/2014 0548   CO2 29 04/04/2012 0945   BUN 7.9 08/14/2014 1128   BUN 8 01/28/2014 0548   BUN 16 04/04/2012 0945   CREATININE 0.8 08/14/2014 1128   CREATININE 0.65 01/28/2014 0548   CREATININE 0.9 04/04/2012 0945      Component Value Date/Time   CALCIUM 8.8 08/14/2014 1128   CALCIUM 8.2* 01/28/2014 0548   CALCIUM 8.4 04/04/2012 0945   ALKPHOS 73 08/14/2014 1128   ALKPHOS 56 01/27/2014 0421   ALKPHOS 61 04/04/2012 0945   AST 31 08/14/2014 1128   AST 22 01/27/2014 0421   AST 31 04/04/2012 0945   ALT 28 08/14/2014 1128   ALT 34 01/27/2014 0421   ALT 30 04/04/2012 0945   BILITOT 0.28 08/14/2014 1128   BILITOT 0.3 01/27/2014 0421   BILITOT 0.50 04/04/2012 0945     An EKG was performed and revealed sinus tachycardia with a ventricular rate of 109 bpm there was rightward axis and was read as a borderline ECG.  RADIOGRAPHIC STUDIES: Ct Chest W Contrast  07/15/2014   CLINICAL DATA:  Restaging lung cancer.  EXAM: CT CHEST WITH CONTRAST  TECHNIQUE: Multidetector CT imaging of the chest was performed during intravenous contrast administration.  CONTRAST:  45mL OMNIPAQUE IOHEXOL 300 MG/ML  SOLN  COMPARISON:  04/08/2014 and 01/26/2013  FINDINGS: Chest wall:  No breast masses, supraclavicular or axillary lymphadenopathy. Small scattered nodes are noted. The bony thorax is intact. No destructive bone lesions. Stable scoliosis and osteoporosis.  Mediastinum:  The heart is normal in size. No pericardial effusion. The aorta is normal in caliber. Stable atherosclerotic calcifications at the aortic arch involving the major branch vessels. Coronary artery  calcifications are stable. The esophagus is grossly normal.  Slight overall increase and low-attenuation soft tissue density in the precarinal area. This measures approximately 5.0 x 2.6 cm and previously measured 2.9 x 1.6  cm. This is worrisome for progressive tumor. A repeat PET-CT may be helpful for further evaluation. There is also a slightly enlarging subcarinal lymph node which previously measured 12 x 6.5 mm and now measures 14.5 x 8 mm small right hilar lymph nodes are stable. Stable dense radiation changes in the right paramediastinal along and right mediastinum.  Lungs:  Stable radiation changes involving the right lung. No obvious pulmonary lesions to suggest pulmonary metastatic disease. There is a small right pleural effusion with minimal overlying atelectasis. This is new.  Upper abdomen:  Diffuse fatty infiltration of the liver is again demonstrated. No focal hepatic lesions are identified. Stable small right adrenal gland nodule consistent with a benign adenoma.  IMPRESSION: 1. Progressive low-attenuation soft tissue density in the mediastinum and enlarging subcarinal lymph node worrisome for progressive mediastinal tumor. A followup PET-CT may be helpful for further evaluation. 2. Stable radiation changes involving the right paramediastinal long. 3. No findings for metastatic pulmonary disease. 4. New small right pleural effusion.   Electronically Signed   By: Kalman Jewels M.D.   On: 07/15/2014 12:24   ASSESSMENT AND PLAN: This is a very pleasant 58 years old white female with recurrent non-small cell lung cancer status post concurrent chemoradiation as well as consolidation chemotherapy and she is currently on systemic chemotherapy with single agent gemcitabine status post 4 cycles. She has been observation for the last 6 months but the recent CT scan of the chest showed evidence for disease recurrence especially in the mediastinum is enlarging subcarinal lymph node suspicious for disease  progression. She has resumed chemotherapy with single agent gemcitabine at 1000 mg/m given on days 1 and 8 every 3 weeks. She is status post 1 cycle. Patient was discussed with an also seen by Dr. [name] to also reviewed her ECG. She'll proceed with cycle #2 of her resumed chemotherapy with single agent gemcitabine. She is cautioned should she can to need to have persistent or worsening chest pain to present to the emergency room by EMS if necessary for further evaluation and management. He'll follow-up in 3 weeks prior to cycle #3 of her single agent chemotherapy with gemcitabine. She will be due for restaging CT scan after cycle #3  She was advised to call immediately if she has any concerning symptoms in the interval.  All questions were answered. The patient knows to call the clinic with any problems, questions or concerns. We can certainly see the patient much sooner if necessary.  Carlton Adam PA-C  ADDENDUM: Hematology/Oncology Attending: I had a face to face encounter with the patient. I recommended her care plan. This is a very pleasant 58 years old white female with recurrent non-small cell lung cancer, squamous cell carcinoma. She is currently undergoing systemic chemotherapy with single agent gemcitabine status post 1 cycle and tolerated the first cycle of her treatment fairly well except for mild fatigue. She denied having any significant nausea or vomiting. She has occasional substernal chest pain but an EKG today was unremarkable. I recommended for her to proceed with cycle #2 today as scheduled. She was advised to go immediately to the emergency department if she continued to have persistent chest pain or worsening shortness of breath. She would come back for followup visit in 3 weeks with the next cycle of her treatment. She was advised to call immediately if she has any concerning symptoms in the interval.  Disclaimer: This note was dictated with voice recognition software.  Similar sounding words can inadvertently be  transcribed and may not be corrected upon review. Eilleen Kempf., MD 08/18/2014

## 2014-08-14 NOTE — Telephone Encounter (Signed)
Gave avs & cal for Nov. Sent [facility] mess to add tx.

## 2014-08-17 NOTE — Patient Instructions (Signed)
Continue labs and chemotherapy is scheduled Follow-up in 3 weeks

## 2014-08-18 ENCOUNTER — Emergency Department (HOSPITAL_COMMUNITY)
Admission: EM | Admit: 2014-08-18 | Discharge: 2014-08-19 | Disposition: A | Discharge status: Home / Self Care | Payer: Medicare Other | Attending: Emergency Medicine | Admitting: Emergency Medicine

## 2014-08-18 ENCOUNTER — Encounter (HOSPITAL_COMMUNITY): Payer: Self-pay | Admitting: Oncology

## 2014-08-18 DIAGNOSIS — E119 Type 2 diabetes mellitus without complications: Secondary | ICD-10-CM | POA: Insufficient documentation

## 2014-08-18 DIAGNOSIS — M1990 Unspecified osteoarthritis, unspecified site: Secondary | ICD-10-CM | POA: Diagnosis not present

## 2014-08-18 DIAGNOSIS — Z7982 Long term (current) use of aspirin: Secondary | ICD-10-CM | POA: Diagnosis not present

## 2014-08-18 DIAGNOSIS — R079 Chest pain, unspecified: Secondary | ICD-10-CM | POA: Diagnosis not present

## 2014-08-18 DIAGNOSIS — J449 Chronic obstructive pulmonary disease, unspecified: Secondary | ICD-10-CM | POA: Diagnosis not present

## 2014-08-18 DIAGNOSIS — Z87891 Personal history of nicotine dependence: Secondary | ICD-10-CM | POA: Diagnosis not present

## 2014-08-18 DIAGNOSIS — Z880 Allergy status to penicillin: Secondary | ICD-10-CM | POA: Diagnosis not present

## 2014-08-18 DIAGNOSIS — Z85118 Personal history of other malignant neoplasm of bronchus and lung: Secondary | ICD-10-CM | POA: Diagnosis not present

## 2014-08-18 DIAGNOSIS — R509 Fever, unspecified: Secondary | ICD-10-CM | POA: Insufficient documentation

## 2014-08-18 DIAGNOSIS — E039 Hypothyroidism, unspecified: Secondary | ICD-10-CM | POA: Insufficient documentation

## 2014-08-18 DIAGNOSIS — Z923 Personal history of irradiation: Secondary | ICD-10-CM | POA: Diagnosis not present

## 2014-08-18 DIAGNOSIS — Z7901 Long term (current) use of anticoagulants: Secondary | ICD-10-CM | POA: Diagnosis not present

## 2014-08-18 DIAGNOSIS — Z794 Long term (current) use of insulin: Secondary | ICD-10-CM | POA: Diagnosis not present

## 2014-08-18 DIAGNOSIS — Z79899 Other long term (current) drug therapy: Secondary | ICD-10-CM | POA: Insufficient documentation

## 2014-08-18 DIAGNOSIS — F419 Anxiety disorder, unspecified: Secondary | ICD-10-CM | POA: Insufficient documentation

## 2014-08-18 LAB — CBC WITH DIFFERENTIAL/PLATELET
BASOS PCT: 1 % (ref 0–1)
Basophils Absolute: 0.1 10*3/uL (ref 0.0–0.1)
EOS ABS: 0.1 10*3/uL (ref 0.0–0.7)
Eosinophils Relative: 2 % (ref 0–5)
HCT: 32.5 % — ABNORMAL LOW (ref 36.0–46.0)
HEMOGLOBIN: 10.5 g/dL — AB (ref 12.0–15.0)
LYMPHS ABS: 1 10*3/uL (ref 0.7–4.0)
Lymphocytes Relative: 18 % (ref 12–46)
MCH: 27.3 pg (ref 26.0–34.0)
MCHC: 32.3 g/dL (ref 30.0–36.0)
MCV: 84.4 fL (ref 78.0–100.0)
Monocytes Absolute: 0.1 10*3/uL (ref 0.1–1.0)
Monocytes Relative: 1 % — ABNORMAL LOW (ref 3–12)
NEUTROS PCT: 78 % — AB (ref 43–77)
Neutro Abs: 4.3 10*3/uL (ref 1.7–7.7)
PLATELETS: 360 10*3/uL (ref 150–400)
RBC: 3.85 MIL/uL — AB (ref 3.87–5.11)
RDW: 16.5 % — ABNORMAL HIGH (ref 11.5–15.5)
WBC: 5.6 10*3/uL (ref 4.0–10.5)

## 2014-08-18 LAB — BASIC METABOLIC PANEL
ANION GAP: 15 (ref 5–15)
BUN: 13 mg/dL (ref 6–23)
CHLORIDE: 98 meq/L (ref 96–112)
CO2: 25 mEq/L (ref 19–32)
Calcium: 8.7 mg/dL (ref 8.4–10.5)
Creatinine, Ser: 0.59 mg/dL (ref 0.50–1.10)
Glucose, Bld: 195 mg/dL — ABNORMAL HIGH (ref 70–99)
POTASSIUM: 4.1 meq/L (ref 3.7–5.3)
SODIUM: 138 meq/L (ref 137–147)

## 2014-08-18 LAB — I-STAT CG4 LACTIC ACID, ED: LACTIC ACID, VENOUS: 2.34 mmol/L — AB (ref 0.5–2.2)

## 2014-08-18 LAB — I-STAT TROPONIN, ED: TROPONIN I, POC: 0 ng/mL (ref 0.00–0.08)

## 2014-08-18 MED ORDER — ASPIRIN 81 MG PO CHEW
324.0000 mg | CHEWABLE_TABLET | Freq: Once | ORAL | Status: AC
  Administered 2014-08-18: 324 mg via ORAL
  Filled 2014-08-18: qty 4

## 2014-08-18 MED ORDER — HYDROMORPHONE HCL 1 MG/ML IJ SOLN
0.5000 mg | Freq: Once | INTRAMUSCULAR | Status: AC
  Administered 2014-08-18: 0.5 mg via INTRAVENOUS
  Filled 2014-08-18: qty 1

## 2014-08-18 MED ORDER — ONDANSETRON HCL 4 MG/2ML IJ SOLN
4.0000 mg | Freq: Once | INTRAMUSCULAR | Status: AC
  Administered 2014-08-18: 4 mg via INTRAVENOUS
  Filled 2014-08-18: qty 2

## 2014-08-18 NOTE — ED Notes (Signed)
Pt states that she began having central and left chest pain aching in nature at 1930.  Pt also c/o fever and body aches.  Pt's last chemo w/ gemzar 10/28.

## 2014-08-19 ENCOUNTER — Emergency Department (HOSPITAL_COMMUNITY): Payer: Medicare Other

## 2014-08-19 ENCOUNTER — Encounter (HOSPITAL_COMMUNITY): Payer: Self-pay

## 2014-08-19 LAB — I-STAT TROPONIN, ED: Troponin i, poc: 0 ng/mL (ref 0.00–0.08)

## 2014-08-19 MED ORDER — HEPARIN SOD (PORK) LOCK FLUSH 100 UNIT/ML IV SOLN
500.0000 [IU] | Freq: Once | INTRAVENOUS | Status: AC
  Administered 2014-08-19: 500 [IU]
  Filled 2014-08-19: qty 5

## 2014-08-19 MED ORDER — IOHEXOL 350 MG/ML SOLN
100.0000 mL | Freq: Once | INTRAVENOUS | Status: AC | PRN
  Administered 2014-08-19: 100 mL via INTRAVENOUS

## 2014-08-19 NOTE — Discharge Instructions (Signed)

## 2014-08-19 NOTE — ED Provider Notes (Signed)
CSN: [id number]     Arrival date & time 08/18/14  2113 History   First MD Initiated Contact with Patient 08/18/14 2155     Chief Complaint  Patient presents with  . Chest Pain  . Fever     (Consider location/radiation/quality/duration/timing/severity/associated sxs/prior Treatment) HPI Comments: Patient presents to the emergency department with chief complaint of chest pain. She states the pain started around 7:30 tonight. States pain is mostly in the left side of her chest. She describes it as a sharp sensation. She also complains of generalized fevers, chills, body aches. She is currently being treated for non-small cell lung cancer with chemotherapy by Dr. [name]. She states that her pain is 6 out of 10. She reports that her last chemotherapy was 3 days ago. She reports some hemoptysis 3 days ago, but this has stopped. She has not taken anything to alleviate her symptoms. There are no aggravating or alleviating factors.  The history is provided by the patient. No language interpreter was used.    Past Medical History  Diagnosis Date  . Anxiety   . Hyperlipidemia   . Hypothyroidism   . COPD (chronic obstructive pulmonary disease)   . Arthritis     thumbs  . 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 Diabetes mellitus   . Lung cancer dx'd 05/2009  . Lung cancer dx'd 09/2013    recurrence in LN   Past Surgical History  Procedure Laterality Date  . Thyroidectomy, partial    . Tonsillectomy    . Tubal ligation    . Partial hysterectomy     Family History  Problem Relation Age of Onset  . Colon cancer Mother   . Cancer Mother     colon  . Diabetes type II Father    History  Substance Use Topics  . Smoking status: Former Smoker -- 1.00 packs/day for 45 years    Types: Cigarettes    Quit date: 02/12/2014  . Smokeless tobacco: Never Used     Comment: pt. started back smoking 10/18/2012, half a pack a day  . Alcohol Use: No   OB History    No data available      Review of Systems  Constitutional: Positive for fever. Negative for chills.  Respiratory: Positive for cough. Negative for shortness of breath.   Cardiovascular: Positive for chest pain.  Gastrointestinal: Negative for nausea, vomiting, diarrhea and constipation.  Genitourinary: Negative for dysuria.  All other systems reviewed and are negative.     Allergies  Sulfonamide derivatives; Codeine; Penicillins; and Vicodin  Home Medications   Prior to Admission medications   Medication Sig Start Date End Date Taking? Authorizing Provider  acetaminophen (TYLENOL) 325 MG tablet Take 650 mg by mouth every 6 (six) hours as needed for mild pain or headache.    Yes Historical Provider, MD  albuterol (PROVENTIL HFA;VENTOLIN HFA) 108 (90 BASE) MCG/ACT inhaler Inhale 2 puffs into the lungs every 6 (six) hours as needed for wheezing or shortness of breath. 03/05/14  Yes Brand Males, MD  alendronate (FOSAMAX) 10 MG tablet Take 10 mg by mouth daily.  03/20/14  Yes Historical Provider, MD  aspirin 325 MG tablet Take 325 mg by mouth at bedtime.    Yes Historical Provider, MD  budesonide-formoterol (SYMBICORT) 160-4.5 MCG/ACT inhaler Inhale 2 puffs into the lungs 2 (two) times daily. 03/05/14  Yes Brand Males, MD  cholecalciferol (VITAMIN D) 1000 UNITS tablet Take 1,000 Units by mouth every morning.  Yes Historical Provider, MD  Chromium 1000 MCG TABS Take 1 tablet by mouth every morning.    Yes Historical Provider, MD  cyanocobalamin 1000 MCG tablet Take 1,000 mcg by mouth every morning.    Yes Historical Provider, MD  glimepiride (AMARYL) 2 MG tablet Take 2 mg by mouth daily with breakfast.  12/21/13  Yes Historical Provider, MD  guaiFENesin (MUCINEX) 600 MG 12 hr tablet Take 1,200 mg by mouth 2 (two) times daily.   Yes Historical Provider, MD  ipratropium (ATROVENT) 0.02 % nebulizer solution Take 2.5 mLs (0.5 mg total) by nebulization 4 (four) times daily. 03/05/14  Yes Brand Males, MD   KRILL OIL PO Take 1 tablet by mouth every morning.    Yes Historical Provider, MD  levothyroxine (SYNTHROID, LEVOTHROID) 125 MCG tablet Take 125 mcg by mouth daily before breakfast.    Yes Historical Provider, MD  LORazepam (ATIVAN) 1 MG tablet Take 1 mg by mouth 3 (three) times daily as needed for anxiety.    Yes Historical Provider, MD  magnesium gluconate (MAGONATE) 500 MG tablet Take 500 mg by mouth every morning.    Yes Historical Provider, MD  metFORMIN (GLUCOPHAGE) 1000 MG tablet Take 1,000 mg by mouth 2 (two) times daily with a meal.   Yes Historical Provider, MD  Multiple Vitamin (MULTIVITAMIN WITH MINERALS) TABS tablet Take 1 tablet by mouth every morning.   Yes Historical Provider, MD  OVER THE COUNTER MEDICATION Take 1 capsule by mouth daily. MSM. For energy and joint pain   Yes Historical Provider, MD  prochlorperazine (COMPAZINE) 10 MG tablet Take 1 tablet (10 mg total) by mouth every 6 (six) hours as needed for nausea or vomiting. 08/01/14  Yes Curt Bears, MD  sertraline (ZOLOFT) 100 MG tablet Take 150 mg by mouth at bedtime.    Yes Historical Provider, MD  spironolactone (ALDACTONE) 25 MG tablet Take 25 mg by mouth every morning.    Yes Historical Provider, MD  varenicline (CHANTIX) 1 MG tablet Take 1 mg by mouth 2 (two) times daily.   Yes Historical Provider, MD  albuterol (PROVENTIL) (2.5 MG/3ML) 0.083% nebulizer solution Take 3 mLs (2.5 mg total) by nebulization every 6 (six) hours as needed for wheezing or shortness of breath. 01/01/14   Janece Canterbury, MD  benzonatate (TESSALON) 100 MG capsule Take 2 capsules (200 mg total) by mouth daily as needed for cough. 03/05/14   Brand Males, MD  CHANTIX STARTING MONTH PAK 0.5 MG X 11 & 1 MG X 42 tablet Take 0.5 mg by mouth 2 (two) times daily.  01/08/14   Historical Provider, MD  insulin aspart (NOVOLOG) 100 UNIT/ML injection Inject 0-20 Units into the skin 3 (three) times daily before meals. 01/01/14   Janece Canterbury, MD   lidocaine-prilocaine (EMLA) cream Apply 1 application topically as needed (skin numbing).    Historical Provider, MD  oxyCODONE-acetaminophen (PERCOCET/ROXICET) 5-325 MG per tablet Take 1 tablet by mouth every 6 (six) hours as needed for severe pain. 08/14/14   Carlton Adam, PA-C  predniSONE (DELTASONE) 10 MG tablet  05/02/14   Historical Provider, MD  rivaroxaban (XARELTO) 20 MG TABS tablet Take 1 tablet (20 mg total) by mouth daily with supper. 05/21/14   Curt Bears, MD   BP 130/62 mmHg  Pulse 102  Temp(Src) 98.2 F (36.8 C) (Oral)  Resp 16  SpO2 92% Physical Exam  Constitutional: She is oriented to person, place, and time. She appears well-developed and well-nourished.  HENT:  Head: Normocephalic  and atraumatic.  Eyes: Conjunctivae and EOM are normal. Pupils are equal, round, and reactive to light.  Neck: Normal range of motion. Neck supple.  Cardiovascular: Normal rate and regular rhythm.  Exam reveals no gallop and no friction rub.   No murmur heard. Pulmonary/Chest: Effort normal and breath sounds normal. No respiratory distress. She has no wheezes. She has no rales. She exhibits no tenderness.  Abdominal: Soft. Bowel sounds are normal. She exhibits no distension and no mass. There is no tenderness. There is no rebound and no guarding.  Musculoskeletal: Normal range of motion. She exhibits no edema or tenderness.  Neurological: She is alert and oriented to person, place, and time.  Skin: Skin is warm and dry.  Psychiatric: She has a normal mood and affect. Her behavior is normal. Judgment and thought content normal.  Nursing note and vitals reviewed.   ED Course  Procedures (including critical care time) Labs Review Results for orders placed or performed during the hospital encounter of 08/18/14  CBC with Differential  Result Value Ref Range   WBC 5.6 4.0 - 10.5 K/uL   RBC 3.85 (L) 3.87 - 5.11 MIL/uL   Hemoglobin 10.5 (L) 12.0 - 15.0 g/dL   HCT 32.5 (L) 36.0 - 46.0  %   MCV 84.4 78.0 - 100.0 fL   MCH 27.3 26.0 - 34.0 pg   MCHC 32.3 30.0 - 36.0 g/dL   RDW 16.5 (H) 11.5 - 15.5 %   Platelets 360 150 - 400 K/uL   Neutrophils Relative % 78 (H) 43 - 77 %   Neutro Abs 4.3 1.7 - 7.7 K/uL   Lymphocytes Relative 18 12 - 46 %   Lymphs Abs 1.0 0.7 - 4.0 K/uL   Monocytes Relative 1 (L) 3 - 12 %   Monocytes Absolute 0.1 0.1 - 1.0 K/uL   Eosinophils Relative 2 0 - 5 %   Eosinophils Absolute 0.1 0.0 - 0.7 K/uL   Basophils Relative 1 0 - 1 %   Basophils Absolute 0.1 0.0 - 0.1 K/uL  Basic metabolic panel  Result Value Ref Range   Sodium 138 137 - 147 mEq/L   Potassium 4.1 3.7 - 5.3 mEq/L   Chloride 98 96 - 112 mEq/L   CO2 25 19 - 32 mEq/L   Glucose, Bld 195 (H) 70 - 99 mg/dL   BUN 13 6 - 23 mg/dL   Creatinine, Ser 0.59 0.50 - 1.10 mg/dL   Calcium 8.7 8.4 - 10.5 mg/dL   GFR calc non Af Amer >90 >90 mL/min   GFR calc Af Amer >90 >90 mL/min   Anion gap 15 5 - 15  I-Stat Troponin, ED (not at [facility])  Result Value Ref Range   Troponin i, poc 0.00 0.00 - 0.08 ng/mL   Comment 3          I-Stat CG4 Lactic Acid, ED  Result Value Ref Range   Lactic Acid, Venous 2.34 (H) 0.5 - 2.2 mmol/L  I-Stat Troponin, ED (not at [facility])  Result Value Ref Range   Troponin i, poc 0.00 0.00 - 0.08 ng/mL   Comment 3           Ct Angio Chest Pe W/cm &/or Wo Cm  08/19/2014   CLINICAL DATA:  Central and left chest pain. Fever. Body aches. History of lung cancer, completed radiation. Ongoing chemotherapy.  EXAM: CT ANGIOGRAPHY CHEST WITH CONTRAST  TECHNIQUE: Multidetector CT imaging of the chest was performed using the standard protocol during bolus  administration of intravenous contrast. Multiplanar CT image reconstructions and MIPs were obtained to evaluate the vascular anatomy.  CONTRAST:  138mL OMNIPAQUE IOHEXOL 350 MG/ML SOLN  COMPARISON:  07/15/2014  FINDINGS: Technically adequate study with good opacification of the central and segmental pulmonary arteries. No focal filling defects.  No evidence of significant pulmonary embolus. There is a enlarged subcarinal lymph node and right hilar mass lesion. There is increased soft tissue in around these lesions, likely representing radiation change. Spiculation around the right hilar mass likely also due to radiation. Since the previous study, there is increased consolidation in the right upper lung which may be due to radiation change or postobstructive pneumonia. Neoplastic progression is not excluded. Increased soft tissue infiltration in the upper mediastinum.  Normal heart size. Normal caliber thoracic aorta. No aortic dissection. Great vessel origins are patent. Vascular calcifications present. Coronary artery calcifications. Right central venous catheter. No focal consolidation or airspace disease on the left. No pneumothorax. No pleural effusions.  Visualized portions of the upper abdominal organs demonstrate diffuse fatty infiltration of the liver.  Review of the MIP images confirms the above findings.  IMPRESSION: No evidence of significant pulmonary embolus. 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 Spiculation in the right hilum with increased density in the right hilum and upper mediastinum, likely due to radiation changes. Increasing consolidation in the right upper lung may be due to radiation or postobstructive change. Progression of neoplasm is not excluded.   Electronically Signed   By: Lucienne Capers M.D.   On: 08/19/2014 00:40     Imaging Review Ct Angio Chest Pe W/cm &/or Wo Cm  08/19/2014   CLINICAL DATA:  Central and left chest pain. Fever. Body aches. History of lung cancer, completed radiation. Ongoing chemotherapy.  EXAM: CT ANGIOGRAPHY CHEST WITH CONTRAST  TECHNIQUE: Multidetector CT imaging of the chest was performed using the standard protocol during bolus administration of intravenous contrast. Multiplanar CT image reconstructions and MIPs were obtained to evaluate the vascular anatomy.  CONTRAST:   151mL OMNIPAQUE IOHEXOL 350 MG/ML SOLN  COMPARISON:  07/15/2014  FINDINGS: Technically adequate study with good opacification of the central and segmental pulmonary arteries. No focal filling defects. No evidence of significant pulmonary embolus. There is a enlarged subcarinal lymph node and right hilar mass lesion. There is increased soft tissue in around these lesions, likely representing radiation change. Spiculation around the right hilar mass likely also due to radiation. Since the previous study, there is increased consolidation in the right upper lung which may be due to radiation change or postobstructive pneumonia. Neoplastic progression is not excluded. Increased soft tissue infiltration in the upper mediastinum.  Normal heart size. Normal caliber thoracic aorta. No aortic dissection. Great vessel origins are patent. Vascular calcifications present. Coronary artery calcifications. Right central venous catheter. No focal consolidation or airspace disease on the left. No pneumothorax. No pleural effusions.  Visualized portions of the upper abdominal organs demonstrate diffuse fatty infiltration of the liver.  Review of the MIP images confirms the above findings.  IMPRESSION: No evidence of significant pulmonary embolus. 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 Spiculation in the right hilum with increased density in the right hilum and upper mediastinum, likely due to radiation changes. Increasing consolidation in the right upper lung may be due to radiation or postobstructive change. Progression of neoplasm is not excluded.   Electronically Signed   By: Lucienne Capers M.D.   On: 08/19/2014 00:40     EKG Interpretation None  MDM   Final diagnoses:  Chest pain    Patient with chest pain. She has a history of lung cancer. She is currently being treated with chemotherapy. She is afebrile. She is mildly tachycardic.  No SOB.  Will check labs and CT.  CT is negative for  pneumonia, negative for PE.  EKG reviewed with Dr. Wilson Singer, no significant changes.  Troponin is negative.  Delta troponin negative.  Doubt ACS.  Likely chest pain related to lung cancer and possibly mass effect.  Discussed the patient in detail with Dr. Wilson Singer, who agrees that the patient can be discharged to home.  Patient agrees with the plan.  Follow-up with Dr. [name].  Patient is stable and ready for discharge.   Montine Circle, PA-C 08/19/14 0122  Virgel Manifold, MD 08/22/14 [phone]

## 2014-08-21 ENCOUNTER — Ambulatory Visit (HOSPITAL_BASED_OUTPATIENT_CLINIC_OR_DEPARTMENT_OTHER): Payer: Medicare Other

## 2014-08-21 ENCOUNTER — Other Ambulatory Visit (HOSPITAL_BASED_OUTPATIENT_CLINIC_OR_DEPARTMENT_OTHER): Payer: Medicare Other

## 2014-08-21 DIAGNOSIS — C3411 Malignant neoplasm of upper lobe, right bronchus or lung: Secondary | ICD-10-CM

## 2014-08-21 DIAGNOSIS — Z5111 Encounter for antineoplastic chemotherapy: Secondary | ICD-10-CM

## 2014-08-21 DIAGNOSIS — C3490 Malignant neoplasm of unspecified part of unspecified bronchus or lung: Secondary | ICD-10-CM

## 2014-08-21 DIAGNOSIS — C3491 Malignant neoplasm of unspecified part of right bronchus or lung: Secondary | ICD-10-CM

## 2014-08-21 LAB — COMPREHENSIVE METABOLIC PANEL (CC13)
ALK PHOS: 63 U/L (ref 40–150)
ALT: 49 U/L (ref 0–55)
AST: 41 U/L — ABNORMAL HIGH (ref 5–34)
Albumin: 3.4 g/dL — ABNORMAL LOW (ref 3.5–5.0)
Anion Gap: 12 mEq/L — ABNORMAL HIGH (ref 3–11)
BILIRUBIN TOTAL: 0.21 mg/dL (ref 0.20–1.20)
BUN: 7.2 mg/dL (ref 7.0–26.0)
CO2: 22 meq/L (ref 22–29)
CREATININE: 0.8 mg/dL (ref 0.6–1.1)
Calcium: 8.4 mg/dL (ref 8.4–10.4)
Chloride: 105 mEq/L (ref 98–109)
Glucose: 253 mg/dl — ABNORMAL HIGH (ref 70–140)
Potassium: 4 mEq/L (ref 3.5–5.1)
SODIUM: 140 meq/L (ref 136–145)
TOTAL PROTEIN: 6.8 g/dL (ref 6.4–8.3)

## 2014-08-21 LAB — CBC WITH DIFFERENTIAL/PLATELET
BASO%: 1.6 % (ref 0.0–2.0)
Basophils Absolute: 0 10*3/uL (ref 0.0–0.1)
EOS%: 0.4 % (ref 0.0–7.0)
Eosinophils Absolute: 0 10*3/uL (ref 0.0–0.5)
HEMATOCRIT: 34 % — AB (ref 34.8–46.6)
HGB: 11 g/dL — ABNORMAL LOW (ref 11.6–15.9)
LYMPH%: 34.2 % (ref 14.0–49.7)
MCH: 27.1 pg (ref 25.1–34.0)
MCHC: 32.4 g/dL (ref 31.5–36.0)
MCV: 83.7 fL (ref 79.5–101.0)
MONO#: 0.4 10*3/uL (ref 0.1–0.9)
MONO%: 15.6 % — AB (ref 0.0–14.0)
NEUT#: 1.2 10*3/uL — ABNORMAL LOW (ref 1.5–6.5)
NEUT%: 48.2 % (ref 38.4–76.8)
PLATELETS: 197 10*3/uL (ref 145–400)
RBC: 4.06 10*6/uL (ref 3.70–5.45)
RDW: 16.1 % — ABNORMAL HIGH (ref 11.2–14.5)
WBC: 2.6 10*3/uL — AB (ref 3.9–10.3)
lymph#: 0.9 10*3/uL (ref 0.9–3.3)

## 2014-08-21 LAB — TECHNOLOGIST REVIEW: Technologist Review: 2

## 2014-08-21 MED ORDER — PROCHLORPERAZINE MALEATE 10 MG PO TABS
10.0000 mg | ORAL_TABLET | Freq: Once | ORAL | Status: AC
  Administered 2014-08-21: 10 mg via ORAL

## 2014-08-21 MED ORDER — HEPARIN SOD (PORK) LOCK FLUSH 100 UNIT/ML IV SOLN
500.0000 [IU] | Freq: Once | INTRAVENOUS | Status: AC | PRN
  Administered 2014-08-21: 500 [IU]
  Filled 2014-08-21: qty 5

## 2014-08-21 MED ORDER — SODIUM CHLORIDE 0.9 % IV SOLN
1000.0000 mg/m2 | Freq: Once | INTRAVENOUS | Status: AC
  Administered 2014-08-21: 2014 mg via INTRAVENOUS
  Filled 2014-08-21: qty 52.97

## 2014-08-21 MED ORDER — SODIUM CHLORIDE 0.9 % IV SOLN
Freq: Once | INTRAVENOUS | Status: AC
  Administered 2014-08-21: 11:00:00 via INTRAVENOUS

## 2014-08-21 MED ORDER — PROCHLORPERAZINE MALEATE 10 MG PO TABS
ORAL_TABLET | ORAL | Status: AC
  Filled 2014-08-21: qty 1

## 2014-08-21 MED ORDER — SODIUM CHLORIDE 0.9 % IJ SOLN
10.0000 mL | INTRAMUSCULAR | Status: DC | PRN
  Administered 2014-08-21: 10 mL
  Filled 2014-08-21: qty 10

## 2014-08-21 NOTE — Progress Notes (Signed)
OK to treat with CBC and CMET from today per Dr [name]

## 2014-08-21 NOTE — Patient Instructions (Signed)
[place] Discharge Instructions for Patients Receiving Chemotherapy  Today you received the following chemotherapy agents Gemzar  To help prevent nausea and vomiting after your treatment, we encourage you to take your nausea medication as needed   If you develop nausea and vomiting that is not controlled by your nausea medication, call the clinic.   BELOW ARE SYMPTOMS THAT SHOULD BE REPORTED IMMEDIATELY:  *FEVER GREATER THAN 100.5 F  *CHILLS WITH OR WITHOUT FEVER  NAUSEA AND VOMITING THAT IS NOT CONTROLLED WITH YOUR NAUSEA MEDICATION  *UNUSUAL SHORTNESS OF BREATH  *UNUSUAL BRUISING OR BLEEDING  TENDERNESS IN MOUTH AND THROAT WITH OR WITHOUT PRESENCE OF ULCERS  *URINARY PROBLEMS  *BOWEL PROBLEMS  UNUSUAL RASH Items with * indicate a potential emergency and should be followed up as soon as possible.  Feel free to call the clinic you have any questions or concerns. The clinic phone number is [phone].

## 2014-08-28 ENCOUNTER — Other Ambulatory Visit (HOSPITAL_BASED_OUTPATIENT_CLINIC_OR_DEPARTMENT_OTHER): Payer: Medicare Other

## 2014-08-28 DIAGNOSIS — C3491 Malignant neoplasm of unspecified part of right bronchus or lung: Secondary | ICD-10-CM

## 2014-08-28 DIAGNOSIS — C3411 Malignant neoplasm of upper lobe, right bronchus or lung: Secondary | ICD-10-CM

## 2014-08-28 LAB — CBC WITH DIFFERENTIAL/PLATELET
BASO%: 0.6 % (ref 0.0–2.0)
BASOS ABS: 0 10*3/uL (ref 0.0–0.1)
EOS%: 0.3 % (ref 0.0–7.0)
Eosinophils Absolute: 0 10*3/uL (ref 0.0–0.5)
HEMATOCRIT: 33.1 % — AB (ref 34.8–46.6)
HEMOGLOBIN: 10.9 g/dL — AB (ref 11.6–15.9)
LYMPH#: 1 10*3/uL (ref 0.9–3.3)
LYMPH%: 30.9 % (ref 14.0–49.7)
MCH: 27.5 pg (ref 25.1–34.0)
MCHC: 32.9 g/dL (ref 31.5–36.0)
MCV: 83.6 fL (ref 79.5–101.0)
MONO#: 0.5 10*3/uL (ref 0.1–0.9)
MONO%: 15 % — AB (ref 0.0–14.0)
NEUT%: 53.2 % (ref 38.4–76.8)
NEUTROS ABS: 1.7 10*3/uL (ref 1.5–6.5)
Platelets: 71 10*3/uL — ABNORMAL LOW (ref 145–400)
RBC: 3.96 10*6/uL (ref 3.70–5.45)
RDW: 16.6 % — ABNORMAL HIGH (ref 11.2–14.5)
WBC: 3.1 10*3/uL — ABNORMAL LOW (ref 3.9–10.3)

## 2014-08-28 LAB — COMPREHENSIVE METABOLIC PANEL (CC13)
ALBUMIN: 3.5 g/dL (ref 3.5–5.0)
ALT: 43 U/L (ref 0–55)
AST: 38 U/L — ABNORMAL HIGH (ref 5–34)
Alkaline Phosphatase: 65 U/L (ref 40–150)
Anion Gap: 10 mEq/L (ref 3–11)
BUN: 9 mg/dL (ref 7.0–26.0)
CHLORIDE: 104 meq/L (ref 98–109)
CO2: 25 mEq/L (ref 22–29)
Calcium: 8.6 mg/dL (ref 8.4–10.4)
Creatinine: 0.8 mg/dL (ref 0.6–1.1)
GLUCOSE: 173 mg/dL — AB (ref 70–140)
POTASSIUM: 3.8 meq/L (ref 3.5–5.1)
Sodium: 139 mEq/L (ref 136–145)
Total Bilirubin: 0.28 mg/dL (ref 0.20–1.20)
Total Protein: 6.9 g/dL (ref 6.4–8.3)

## 2014-08-28 LAB — TECHNOLOGIST REVIEW

## 2014-08-29 ENCOUNTER — Other Ambulatory Visit: Payer: Self-pay | Admitting: Internal Medicine

## 2014-08-29 DIAGNOSIS — C3491 Malignant neoplasm of unspecified part of right bronchus or lung: Secondary | ICD-10-CM

## 2014-08-30 ENCOUNTER — Ambulatory Visit (HOSPITAL_COMMUNITY): Admission: RE | Admit: 2014-08-30 | Payer: Medicare Other | Source: Ambulatory Visit

## 2014-09-02 ENCOUNTER — Encounter (HOSPITAL_COMMUNITY): Payer: Self-pay

## 2014-09-02 ENCOUNTER — Ambulatory Visit (HOSPITAL_COMMUNITY)
Admission: RE | Admit: 2014-09-02 | Discharge: 2014-09-02 | Disposition: A | Discharge status: Home / Self Care | Payer: Medicare Other | Source: Ambulatory Visit | Attending: Physician Assistant | Admitting: Physician Assistant

## 2014-09-02 DIAGNOSIS — Z9221 Personal history of antineoplastic chemotherapy: Secondary | ICD-10-CM | POA: Diagnosis not present

## 2014-09-02 DIAGNOSIS — C3491 Malignant neoplasm of unspecified part of right bronchus or lung: Secondary | ICD-10-CM | POA: Diagnosis not present

## 2014-09-02 MED ORDER — IOHEXOL 300 MG/ML  SOLN: 100.0000 mL | Freq: Once | INTRAMUSCULAR | Status: AC | PRN

## 2014-09-04 ENCOUNTER — Other Ambulatory Visit (HOSPITAL_BASED_OUTPATIENT_CLINIC_OR_DEPARTMENT_OTHER): Payer: Medicare Other

## 2014-09-04 ENCOUNTER — Telehealth: Payer: Self-pay | Admitting: *Deleted

## 2014-09-04 ENCOUNTER — Encounter: Payer: Self-pay | Admitting: Internal Medicine

## 2014-09-04 ENCOUNTER — Ambulatory Visit (HOSPITAL_BASED_OUTPATIENT_CLINIC_OR_DEPARTMENT_OTHER): Payer: Medicare Other | Admitting: Internal Medicine

## 2014-09-04 ENCOUNTER — Ambulatory Visit: Payer: Medicare Other

## 2014-09-04 ENCOUNTER — Telehealth: Payer: Self-pay | Admitting: Internal Medicine

## 2014-09-04 VITALS — BP 144/67 | HR 109 | Temp 98.2°F | Resp 19 | Ht 68.0 in | Wt 184.7 lb

## 2014-09-04 DIAGNOSIS — C3491 Malignant neoplasm of unspecified part of right bronchus or lung: Secondary | ICD-10-CM

## 2014-09-04 DIAGNOSIS — C3411 Malignant neoplasm of upper lobe, right bronchus or lung: Secondary | ICD-10-CM

## 2014-09-04 LAB — COMPREHENSIVE METABOLIC PANEL (CC13)
ALBUMIN: 3.6 g/dL (ref 3.5–5.0)
ALT: 18 U/L (ref 0–55)
ANION GAP: 11 meq/L (ref 3–11)
AST: 21 U/L (ref 5–34)
Alkaline Phosphatase: 72 U/L (ref 40–150)
BUN: 7.8 mg/dL (ref 7.0–26.0)
CO2: 25 meq/L (ref 22–29)
Calcium: 8.5 mg/dL (ref 8.4–10.4)
Chloride: 104 mEq/L (ref 98–109)
Creatinine: 0.7 mg/dL (ref 0.6–1.1)
GLUCOSE: 170 mg/dL — AB (ref 70–140)
Potassium: 4.2 mEq/L (ref 3.5–5.1)
SODIUM: 141 meq/L (ref 136–145)
TOTAL PROTEIN: 7 g/dL (ref 6.4–8.3)
Total Bilirubin: 0.22 mg/dL (ref 0.20–1.20)

## 2014-09-04 LAB — CBC WITH DIFFERENTIAL/PLATELET
BASO%: 1.4 % (ref 0.0–2.0)
Basophils Absolute: 0.1 10*3/uL (ref 0.0–0.1)
EOS%: 3.3 % (ref 0.0–7.0)
Eosinophils Absolute: 0.2 10*3/uL (ref 0.0–0.5)
HCT: 35.8 % (ref 34.8–46.6)
HGB: 11.6 g/dL (ref 11.6–15.9)
LYMPH%: 12.5 % — ABNORMAL LOW (ref 14.0–49.7)
MCH: 27.5 pg (ref 25.1–34.0)
MCHC: 32.3 g/dL (ref 31.5–36.0)
MCV: 85.3 fL (ref 79.5–101.0)
MONO#: 0.5 10*3/uL (ref 0.1–0.9)
MONO%: 9.1 % (ref 0.0–14.0)
NEUT%: 73.7 % (ref 38.4–76.8)
NEUTROS ABS: 4.4 10*3/uL (ref 1.5–6.5)
Platelets: 317 10*3/uL (ref 145–400)
RBC: 4.2 10*6/uL (ref 3.70–5.45)
RDW: 18.7 % — AB (ref 11.2–14.5)
WBC: 5.9 10*3/uL (ref 3.9–10.3)
lymph#: 0.7 10*3/uL — ABNORMAL LOW (ref 0.9–3.3)

## 2014-09-04 MED ORDER — OXYCODONE-ACETAMINOPHEN 5-325 MG PO TABS: 1.0000 | ORAL_TABLET | Freq: Four times a day (QID) | ORAL | Status: DC | PRN

## 2014-09-04 NOTE — Telephone Encounter (Signed)
Pt confirmed labs/ov per 11/18 POF, gave pt AVS..... KJ

## 2014-09-04 NOTE — Progress Notes (Signed)
[place] Telephone:([phone]) [phone]   [phone]) [phone]  OFFICE PROGRESS NOTE  [name], NP [place] [address]  DIAGNOSIS: Recurrent non-small cell lung cancer, squamous cell carcinoma initially diagnosis stage IIIa (T1 N2 MX ) in August of 2010.   PRIOR THERAPY:  #1 status post concurrent chemoradiation with weekly carboplatin and paclitaxel, last dose of chemotherapy given 08/05/2011.  #2 status post consolidation chemotherapy with carboplatin paclitaxel last dose given 10/27/2009.  #3 Concurrent chemoradiation with weekly carboplatin for an AUC of 2 and paclitaxel at 45 mg per meter squared given concurrent with radiotherapy, last dose was given 09/20/2011.  #4 Systemic chemotherapy with gemcitabine 1000 mg meter squared on days 1 and 8 every 3 weeks status post 3 cycles with stable disease, last dose was given 12/20/2011.  #5 Systemic chemotherapy again with single agent gemcitabine 1000 mg/M2 on days 1 and 8 every 3 weeks. First cycle 11/07/2013. Status post 4 cycles. #6  Systemic chemotherapy again with single agent gemcitabine 1000 mg/M2 on days 1 and 8 every 3 weeks. First dose 07/24/2014. Discontinued today secondary to intolerance.   CURRENT THERAPY: immunotherapy with Nivolumab 3 MG/KG every 2 weeks. First dose 09/18/2014.  INTERVAL HISTORY: Janice Ball 58 y.o. female returns to the clinic today for follow-up visit. The patient completed 3 cycles of systemic chemotherapy with single agent gemcitabine. She has significant fatigue and weakness as well as intermittent chest pain. She was seen at the emergency department a few times recently because of her chest pain but no clear etiology. She denied having any significant shortness of breath, cough or hemoptysis. She denied having any fever or chills. She denied having any significant weight loss or night sweats.  She denied any significant nausea or vomiting. She had repeat CT  scan of the chest, abdomen and pelvis performed recently and she is here for evaluation and discussion of her scan results.   MEDICAL HISTORY: Past Medical History  Diagnosis Date  . Anxiety   . Hyperlipidemia   . Hypothyroidism   . COPD (chronic obstructive pulmonary disease)   . Arthritis     thumbs  . 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 Diabetes mellitus   . Lung cancer dx'd 05/2009  . Lung cancer dx'd 09/2013    recurrence in LN    ALLERGIES:  is allergic to sulfonamide derivatives; codeine; penicillins; and vicodin.  MEDICATIONS:  Current Outpatient Prescriptions  Medication Sig Dispense Refill  . acetaminophen (TYLENOL) 325 MG tablet Take 650 mg by mouth every 6 (six) hours as needed for mild pain or headache.     . alendronate (FOSAMAX) 10 MG tablet Take 10 mg by mouth daily.     Marland Kitchen aspirin 325 MG tablet Take 325 mg by mouth at bedtime.     . budesonide-formoterol (SYMBICORT) 160-4.5 MCG/ACT inhaler Inhale 2 puffs into the lungs 2 (two) times daily. 1 Inhaler 5  . cholecalciferol (VITAMIN D) 1000 UNITS tablet Take 1,000 Units by mouth every morning.     . Chromium 1000 MCG TABS Take 1 tablet by mouth every morning.     . cyanocobalamin 1000 MCG tablet Take 1,000 mcg by mouth every morning.     Marland Kitchen glimepiride (AMARYL) 2 MG tablet Take 2 mg by mouth 2 (two) times daily.     Marland Kitchen guaiFENesin (MUCINEX) 600 MG 12 hr tablet Take 1,200 mg by mouth 2 (two) times daily.    Marland Kitchen ipratropium (  ATROVENT) 0.02 % nebulizer solution Take 2.5 mLs (0.5 mg total) by nebulization 4 (four) times daily. 150 mL 4  . KRILL OIL PO Take 1 tablet by mouth every morning.     Marland Kitchen levothyroxine (SYNTHROID, LEVOTHROID) 125 MCG tablet Take 125 mcg by mouth daily before breakfast.     . lidocaine-prilocaine (EMLA) cream Apply 1 application topically as needed (skin numbing).    . LORazepam (ATIVAN) 1 MG tablet Take 1 mg by mouth 3 (three) times daily as needed for anxiety.     . magnesium gluconate  (MAGONATE) 500 MG tablet Take 500 mg by mouth every morning.     . metFORMIN (GLUCOPHAGE) 1000 MG tablet Take 1,000 mg by mouth 2 (two) times daily with a meal.    . Multiple Vitamin (MULTIVITAMIN WITH MINERALS) TABS tablet Take 1 tablet by mouth every morning.    Marland Kitchen OVER THE COUNTER MEDICATION Take 1 capsule by mouth daily. MSM. For energy and joint pain    . oxyCODONE-acetaminophen (PERCOCET/ROXICET) 5-325 MG per tablet Take 1 tablet by mouth every 6 (six) hours as needed for severe pain. 30 tablet 0  . prochlorperazine (COMPAZINE) 10 MG tablet TAKE 1 TABLET (10 MG TOTAL) BY MOUTH EVERY 6 (SIX) HOURS AS NEEDED FOR NAUSEA OR VOMITING. 30 tablet 0  . sertraline (ZOLOFT) 100 MG tablet Take 150 mg by mouth at bedtime.     Marland Kitchen spironolactone (ALDACTONE) 25 MG tablet Take 25 mg by mouth every morning.     . varenicline (CHANTIX PAK) 0.5 MG X 11 & 1 MG X 42 tablet Take 0.5 mg by mouth 2 (two) times daily. Take one 0.5 mg tablet by mouth once daily for 3 days, then increase to one 0.5 mg tablet twice daily for 4 days, then increase to one 1 mg tablet twice daily.    . varenicline (CHANTIX) 1 MG tablet Take 1 mg by mouth 2 (two) times daily.    Marland Kitchen PROAIR HFA 108 (90 BASE) MCG/ACT inhaler   2   No current facility-administered medications for this visit.    SURGICAL HISTORY:  Past Surgical History  Procedure Laterality Date  . Thyroidectomy, partial    . Tonsillectomy    . Tubal ligation    . Partial hysterectomy      REVIEW OF SYSTEMS:  Constitutional: negative Eyes: negative Ears, nose, mouth, throat, and face: negative Respiratory: negative Cardiovascular: negative Gastrointestinal: negative Genitourinary:negative Integument/breast: negative Hematologic/lymphatic: negative Musculoskeletal:negative Neurological: negative Behavioral/Psych: negative Endocrine: negative Allergic/Immunologic: negative   PHYSICAL EXAMINATION: General appearance: alert, cooperative and no distress Head:  Normocephalic, without obvious abnormality, atraumatic Neck: no adenopathy, no JVD, supple, symmetrical, trachea midline and thyroid not enlarged, symmetric, no tenderness/mass/nodules Lymph nodes: Cervical, supraclavicular, and axillary nodes normal. Resp: clear to auscultation bilaterally Back: symmetric, no curvature. ROM normal. No CVA tenderness. Cardio: regular rate and rhythm, S1, S2 normal, no murmur, click, rub or gallop GI: soft, non-tender; bowel sounds normal; no masses,  no organomegaly Extremities: extremities normal, atraumatic, no cyanosis or edema Neurologic: Alert and oriented X 3, normal strength and tone. Normal symmetric reflexes. Normal coordination and gait  ECOG PERFORMANCE STATUS: 1 - Symptomatic but completely ambulatory  Blood pressure 144/67, pulse 109, temperature 98.2 F (36.8 C), resp. rate 19, height 5\' 8"  (1.727 m), weight 184 lb 11.2 oz (83.779 kg).  LABORATORY DATA: Lab Results  Component Value Date   WBC 5.9 09/04/2014   HGB 11.6 09/04/2014   HCT 35.8 09/04/2014   MCV 85.3 09/04/2014  PLT 317 09/04/2014      Chemistry      Component Value Date/Time   NA 141 09/04/2014 0949   NA 138 08/18/2014 2204   NA 141 04/04/2012 0945   K 4.2 09/04/2014 0949   K 4.1 08/18/2014 2204   K 4.7 04/04/2012 0945   CL 98 08/18/2014 2204   CL 101 04/06/2013 1041   CL 100 04/04/2012 0945   CO2 25 09/04/2014 0949   CO2 25 08/18/2014 2204   CO2 29 04/04/2012 0945   BUN 7.8 09/04/2014 0949   BUN 13 08/18/2014 2204   BUN 16 04/04/2012 0945   CREATININE 0.7 09/04/2014 0949   CREATININE 0.59 08/18/2014 2204   CREATININE 0.9 04/04/2012 0945      Component Value Date/Time   CALCIUM 8.5 09/04/2014 0949   CALCIUM 8.7 08/18/2014 2204   CALCIUM 8.4 04/04/2012 0945   ALKPHOS 72 09/04/2014 0949   ALKPHOS 56 01/27/2014 0421   ALKPHOS 61 04/04/2012 0945   AST 21 09/04/2014 0949   AST 22 01/27/2014 0421   AST 31 04/04/2012 0945   ALT 18 09/04/2014 0949   ALT 34  01/27/2014 0421   ALT 30 04/04/2012 0945   BILITOT 0.22 09/04/2014 0949   BILITOT 0.3 01/27/2014 0421   BILITOT 0.50 04/04/2012 0945       RADIOGRAPHIC STUDIES: Ct Chest W Contrast  09/03/2014   CLINICAL DATA:  58 year old female with history of non-small cell lung cancer diagnosed in 2010, with recurrence in 2014. Chemotherapy complete.  EXAM: CT CHEST, ABDOMEN, AND PELVIS WITH CONTRAST  TECHNIQUE: Multidetector CT imaging of the chest, abdomen and pelvis was performed following the standard protocol during bolus administration of intravenous contrast.  CONTRAST:  100 mL of Omnipaque 300.  COMPARISON:  Chest CT 08/19/2014. CT of the chest abdomen and pelvis 04/08/2014.  FINDINGS: CT CHEST FINDINGS  Mediastinum: Again noted is mass like soft tissue in the right hilar region, in part related to postradiation masslike fibrosis, however, there does appear to be some associated abnormal soft tissue from recurrence and lymphadenopathy. Specifically, there is a 1.3 cm right hilar lymph node (image 21 of series 2), there is masslike soft tissue immediately anterior to the carina which is amorphous in appearance and very infiltrative, extending posterior to the superior vena cava, and therefore difficult to discretely measure, but estimated to measure approximately 5.0 x 2.8 cm (image 19 of series 2), similar to the most recent prior examination, 12 mm short axis subcarinal lymph node, and 11 mm short axis high right paratracheal lymph node. Partially calcified low right paratracheal lymph node again incidentally noted. Right internal jugular single-lumen porta cath with tip terminating in the right atrium. Heart size is normal. Small amount of pericardial fluid and/or thickening anteriorly, unchanged, and unlikely to be of hemodynamic significance at this time. No associated pericardial calcification. There is atherosclerosis of the thoracic aorta, the great vessels of the mediastinum and the coronary arteries,  including calcified atherosclerotic plaque in the left main and left anterior descending. Coronary arteries. Esophagus is remarkable for some circumferential thickening in the mid esophagus, which could suggest esophagitis.  Lungs/Pleura: As mentioned above, there is extensive architectural distortion and mass like soft tissue thickening in the right hilar and perihilar region. This is largely similar to prior studies, and is likely predominantly related to progressive masslike fibrosis in the setting of prior radiation therapy, however, some component of local recurrence of disease is difficult to entirely exclude on today's examination. This  area measures up to 4.7 x 2.6 cm (image 21 of series 2). No other new suspicious appearing pulmonary nodules or masses are otherwise noted. No acute consolidative airspace disease. No pleural effusions. Small amount of chronic pleural thickening in the posterior aspect of the upper right hemithorax is unchanged.  Musculoskeletal: Multiple old healed right-sided rib fractures are again noted. There are no aggressive appearing lytic or blastic lesions noted in the visualized portions of the skeleton.  CT ABDOMEN AND PELVIS FINDINGS  Hepatobiliary: No focal cystic or solid hepatic lesions. No intra or extrahepatic biliary ductal dilatation. Gallbladder is normal in appearance.  Pancreas: Unremarkable.  Spleen: Unremarkable.  Adrenals/Urinary Tract: 10 x 9 mm nodule in the it lateral limb of the right adrenal gland is incompletely characterized, but unchanged over numerous prior examinations, and not hypermetabolic on prior PET-CT, suggestive of a benign lesion such as a tiny adenoma. Left adrenal gland is normal in appearance. Bilateral kidneys are normal in appearance. No hydroureteronephrosis. Urinary bladder is normal in appearance.  Stomach/Bowel: Normal appearance of the stomach. No pathologic dilatation of small bowel or colon.  Vascular/Lymphatic: Extensive atherosclerosis  throughout the abdominal and pelvic vasculature, without evidence of aneurysm or dissection. No lymphadenopathy noted in the abdomen or pelvis.  Reproductive: Status post hysterectomy. Ovaries are not confidently identified may be surgically absent or atrophic.  Other: No significant volume of ascites.  No pneumoperitoneum.  Musculoskeletal: There are no aggressive appearing lytic or blastic lesions noted in the visualized portions of the skeleton.  IMPRESSION: 1. Posttreatment related changes of radiation therapy in the perihilar aspect of the right lung, most confluent in the posterior right upper lobe where there is an area of masslike fibrosis which is very similar to prior examinations. However, there is persistent right hilar, subcarinal and right paratracheal lymphadenopathy, as well as a confluent area of masslike soft tissue immediately anterior to the carina which is very infiltrative in appearance. These findings are highly concerning for recurrent disease, and although similar to the most recent prior chest CTs, these have clearly increased compared to more remote prior study from 04/08/2014. 2. No signs of metastatic disease in the abdomen or pelvis. 3. Circumferential thickening of the mid thoracic esophagus may indicate esophagitis, potentially treatment related. 4. Atherosclerosis, including left main and left anterior descending coronary artery disease. Please note that although the presence of coronary artery calcium documents the presence of coronary artery disease, the severity of this disease and any potential stenosis cannot be assessed on this non-gated CT examination. Assessment for potential risk factor modification, dietary therapy or pharmacologic therapy may be warranted, if clinically indicated. 5. Additional incidental findings, as above.   Electronically Signed   By: Vinnie Langton M.D.   On: 09/03/2014 09:27   Ct Angio Chest Pe W/cm &/or Wo Cm  08/19/2014   CLINICAL DATA:   Central and left chest pain. Fever. Body aches. History of lung cancer, completed radiation. Ongoing chemotherapy.  EXAM: CT ANGIOGRAPHY CHEST WITH CONTRAST  TECHNIQUE: Multidetector CT imaging of the chest was performed using the standard protocol during bolus administration of intravenous contrast. Multiplanar CT image reconstructions and MIPs were obtained to evaluate the vascular anatomy.  CONTRAST:  147mL OMNIPAQUE IOHEXOL 350 MG/ML SOLN  COMPARISON:  07/15/2014  FINDINGS: Technically adequate study with good opacification of the central and segmental pulmonary arteries. No focal filling defects. No evidence of significant pulmonary embolus. There is a enlarged subcarinal lymph node and right hilar mass lesion. There is increased soft  tissue in around these lesions, likely representing radiation change. Spiculation around the right hilar mass likely also due to radiation. Since the previous study, there is increased consolidation in the right upper lung which may be due to radiation change or postobstructive pneumonia. Neoplastic progression is not excluded. Increased soft tissue infiltration in the upper mediastinum.  Normal heart size. Normal caliber thoracic aorta. No aortic dissection. Great vessel origins are patent. Vascular calcifications present. Coronary artery calcifications. Right central venous catheter. No focal consolidation or airspace disease on the left. No pneumothorax. No pleural effusions.  Visualized portions of the upper abdominal organs demonstrate diffuse fatty infiltration of the liver.  Review of the MIP images confirms the above findings.  IMPRESSION: No evidence of significant pulmonary embolus. 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 Spiculation in the right hilum with increased density in the right hilum and upper mediastinum, likely due to radiation changes. Increasing consolidation in the right upper lung may be due to radiation or postobstructive change.  Progression of neoplasm is not excluded.   Electronically Signed   By: Lucienne Capers M.D.   On: 08/19/2014 00:40   Ct Abdomen Pelvis W Contrast  09/03/2014   CLINICAL DATA:  58 year old female with history of non-small cell lung cancer diagnosed in 2010, with recurrence in 2014. Chemotherapy complete.  EXAM: CT CHEST, ABDOMEN, AND PELVIS WITH CONTRAST  TECHNIQUE: Multidetector CT imaging of the chest, abdomen and pelvis was performed following the standard protocol during bolus administration of intravenous contrast.  CONTRAST:  100 mL of Omnipaque 300.  COMPARISON:  Chest CT 08/19/2014. CT of the chest abdomen and pelvis 04/08/2014.  FINDINGS: CT CHEST FINDINGS  Mediastinum: Again noted is mass like soft tissue in the right hilar region, in part related to postradiation masslike fibrosis, however, there does appear to be some associated abnormal soft tissue from recurrence and lymphadenopathy. Specifically, there is a 1.3 cm right hilar lymph node (image 21 of series 2), there is masslike soft tissue immediately anterior to the carina which is amorphous in appearance and very infiltrative, extending posterior to the superior vena cava, and therefore difficult to discretely measure, but estimated to measure approximately 5.0 x 2.8 cm (image 19 of series 2), similar to the most recent prior examination, 12 mm short axis subcarinal lymph node, and 11 mm short axis high right paratracheal lymph node. Partially calcified low right paratracheal lymph node again incidentally noted. Right internal jugular single-lumen porta cath with tip terminating in the right atrium. Heart size is normal. Small amount of pericardial fluid and/or thickening anteriorly, unchanged, and unlikely to be of hemodynamic significance at this time. No associated pericardial calcification. There is atherosclerosis of the thoracic aorta, the great vessels of the mediastinum and the coronary arteries, including calcified atherosclerotic  plaque in the left main and left anterior descending. Coronary arteries. Esophagus is remarkable for some circumferential thickening in the mid esophagus, which could suggest esophagitis.  Lungs/Pleura: As mentioned above, there is extensive architectural distortion and mass like soft tissue thickening in the right hilar and perihilar region. This is largely similar to prior studies, and is likely predominantly related to progressive masslike fibrosis in the setting of prior radiation therapy, however, some component of local recurrence of disease is difficult to entirely exclude on today's examination. This area measures up to 4.7 x 2.6 cm (image 21 of series 2). No other new suspicious appearing pulmonary nodules or masses are otherwise noted. No acute consolidative airspace disease. No pleural effusions. Small  amount of chronic pleural thickening in the posterior aspect of the upper right hemithorax is unchanged.  Musculoskeletal: Multiple old healed right-sided rib fractures are again noted. There are no aggressive appearing lytic or blastic lesions noted in the visualized portions of the skeleton.  CT ABDOMEN AND PELVIS FINDINGS  Hepatobiliary: No focal cystic or solid hepatic lesions. No intra or extrahepatic biliary ductal dilatation. Gallbladder is normal in appearance.  Pancreas: Unremarkable.  Spleen: Unremarkable.  Adrenals/Urinary Tract: 10 x 9 mm nodule in the it lateral limb of the right adrenal gland is incompletely characterized, but unchanged over numerous prior examinations, and not hypermetabolic on prior PET-CT, suggestive of a benign lesion such as a tiny adenoma. Left adrenal gland is normal in appearance. Bilateral kidneys are normal in appearance. No hydroureteronephrosis. Urinary bladder is normal in appearance.  Stomach/Bowel: Normal appearance of the stomach. No pathologic dilatation of small bowel or colon.  Vascular/Lymphatic: Extensive atherosclerosis throughout the abdominal and pelvic  vasculature, without evidence of aneurysm or dissection. No lymphadenopathy noted in the abdomen or pelvis.  Reproductive: Status post hysterectomy. Ovaries are not confidently identified may be surgically absent or atrophic.  Other: No significant volume of ascites.  No pneumoperitoneum.  Musculoskeletal: There are no aggressive appearing lytic or blastic lesions noted in the visualized portions of the skeleton.  IMPRESSION: 1. Posttreatment related changes of radiation therapy in the perihilar aspect of the right lung, most confluent in the posterior right upper lobe where there is an area of masslike fibrosis which is very similar to prior examinations. However, there is persistent right hilar, subcarinal and right paratracheal lymphadenopathy, as well as a confluent area of masslike soft tissue immediately anterior to the carina which is very infiltrative in appearance. These findings are highly concerning for recurrent disease, and although similar to the most recent prior chest CTs, these have clearly increased compared to more remote prior study from 04/08/2014. 2. No signs of metastatic disease in the abdomen or pelvis. 3. Circumferential thickening of the mid thoracic esophagus may indicate esophagitis, potentially treatment related. 4. Atherosclerosis, including left main and left anterior descending coronary artery disease. Please note that although the presence of coronary artery calcium documents the presence of coronary artery disease, the severity of this disease and any potential stenosis cannot be assessed on this non-gated CT examination. Assessment for potential risk factor modification, dietary therapy or pharmacologic therapy may be warranted, if clinically indicated. 5. Additional incidental findings, as above.   Electronically Signed   By: Vinnie Langton M.D.   On: 09/03/2014 09:27   ASSESSMENT AND PLAN: This is a very pleasant 58 years old white female with recurrent non-small cell lung  cancer status post concurrent chemoradiation as well as consolidation chemotherapy and she is currently on systemic chemotherapy with single agent gemcitabine status post 4 cycles.  She was treated again with a course of systemic chemotherapy with single agent gemcitabine for 3 cycles but the patient has rough time tolerating her treatment with significant fatigue and weakness. The recent CT scan of the chest, abdomen and pelvis showed stable disease but the patient continues to have the persistent right upper lobe area of mass fibrosis and consolidation as well as persistent right hilar, subcarinal and right paratracheal lymphadenopathy as well as, one area of masslike soft tissue immediately anterior to the carina. I discussed the scan results and give the patient the option of continuing her current treatment with gemcitabine versus switching her treatment with immunotherapy with Nivolumab 3 MG/KG every  2 weeks. The patient is concerned about continuing treatment with gemcitabine because of the significant fatigue and weakness and she would like to consider the immunotherapy. I discussed with her the adverse effect of Nivolumab including but not limited to immune mediated colitis, skin rash, hepatitis, nephritis, endocrine dysfunction. She is expected to start the first cycle of this treatment after Thanksgiving She will come back for followup visit in 3 weeks for reevaluation with the start of cycle #2. For pain management,  she was given refill for Percocet 5/325 mg by mouth every 6 hours as needed. She was advised to call immediately if she has any concerning symptoms in the interval.  All questions were answered. The patient knows to call the clinic with any problems, questions or concerns. We can certainly see the patient much sooner if necessary.   Disclaimer: This note was dictated with voice recognition software. Similar sounding words can inadvertently be transcribed and may not be  corrected upon review.

## 2014-09-04 NOTE — Telephone Encounter (Signed)
Per staff message and POF I have scheduled appts. Advised scheduler of appts and to move labs. JMW  

## 2014-09-05 ENCOUNTER — Encounter: Payer: Self-pay | Admitting: Internal Medicine

## 2014-09-05 ENCOUNTER — Ambulatory Visit (INDEPENDENT_AMBULATORY_CARE_PROVIDER_SITE_OTHER): Payer: Medicare Other | Admitting: Internal Medicine

## 2014-09-05 VITALS — BP 150/76 | HR 114 | Ht 67.0 in | Wt 188.0 lb

## 2014-09-05 DIAGNOSIS — J449 Chronic obstructive pulmonary disease, unspecified: Secondary | ICD-10-CM

## 2014-09-05 NOTE — Progress Notes (Signed)
Subjective:    Patient ID: Janice Ball, female    DOB: July 24, 1956, 58 y.o.   MRN: [id number]  HPI   Gold stage 2 COPD - MM genotype and class II dyspnea - July 2012: PFT today that showed preserved lung fxn -mild COPD - FEV1 2.19 L/M (80%), ratio of 66. No sign. Change with SABA . DLCO 65%  #Rec AECOPD - eos 200 or > 3%. This is a marker for risks for recurrent COPD exacerbation - June 2012-outpatient treatment with antibiotics and prednisone. - August 2013-outpatient treatment with doxycycline and prednisone - December 2013-treatment with azithromycin and prednisone by primary care physician - sept 2014 - abx and 1 shot of steroid at [place] office [facility], NP - Nov 2014 - levaquin and steroid 5 day  #Smoking history - Quit 2010.  - Relapse summer 2012 but quit in 1 month - Relapsed May 2013 and quit with the help of Chantix. Quit July 2013 - Relapsed January 2014; due to social stressor; quit again May 2014  #Chronic postnasal drainage and spring allergies  - nasal steroids  #Lung cancer  DIAGNOSIS: Recurrent non-small cell lung cancer, squamous cell carcinoma initially diagnosis stage IIIa (T1 N2 MX ) in August of 2010.   #1 status post concurrent chemoradiation with weekly carboplatin and paclitaxel, last dose of chemotherapy given 08/05/2011.   #2 status post consolidation chemotherapy with carboplatin paclitaxel last dose given 10/27/2009.   #3 Concurrent chemoradiation with weekly carboplatin for an AUC of 2 and paclitaxel at 45 mg per meter squared given concurrent with radiotherapy, last dose was given  09/20/2011.   #4 Systemic chemotherapy with gemcitabine 1000 mg meter squared on days 1 and 8 every 3 weeks status post 3 cycles with stable disease. last dose was given 12/20/2011.   CURRENT THERAPY:  Systemic chemotherapy again with single agent gemcitabine 1000 mg/M2 on days 1 and 8 every 3 weeks. First cycle 11/07/2013. Status post 4 cycles as of  02/06/14          OV 08/29/2013  SMoking:   reports that she quit smoking about 5 months ago. Her smoking use included Cigarettes. She has a 45 pack-year smoking history. She has never used smokeless tobacco.  COPD:: Since I last saw her in April 2014. She had a flareup of COPD in September 2014 and was treated with antibiotics not otherwise specified and 1 injection of steroid by her primary care physician. After that was doing well but just over a week ago started having worsening cough, wheeze and shortness of breath and was started on Levaquin on 08/23/2013 and was advised to take it for 10 days. He's had partial improvement but still has chest tightness. She still continues with Levaquin. She has not taken prednisone although she feels that she could benefit potentially from this. She is open to being a research subject on copd trials  Hoarseness of voice: Resolved    OV 03/05/2014  Chief Complaint  Patient presents with  . Follow-up    Pt states breathing is unchanged. C/o cough producing white and clear mucous. Denies CP.    SMoking:  reports that she quit smoking about a year ago. Her smoking use included Cigarettes. She has a 45 pack-year smoking history. She has never used smokeless tobacco.   Lung cancer: Since I saw her in November 2014 she has had recurrent non-small cell lung cancer. She was subjected to chemotherapy for cycle starting January 2015. She did not overall handle this chemotherapy  well. She had a hospitalization in April for pneumonia and subsequently for generalized fatigue according to her history. She has followup scan pending with Dr. [name] in one month.  COPD: Has eos 200 or > 3%. This is a marker for risks for recurrent COPD exacerbation. At this point in time she is  not in COPD exacerbation. COPD stable. COPD Score is 10. She does want tessalon perles for cough. She wants refills. She maintains compliance. She has not had but will hve PREVNAR  today  REC #COPD - currently stable  -  continue your symbicort and atrovent mdi/nebs;  will do refill - restart tessalon perles 200mg  once daily as needed for cough; will do script - call us anytime for a flare up - Have PREVNAR vaccine 03/05/2014   #followup  6 mnths or sooner if neeed CAT score at followup   OV 09/05/2014 Chief Complaint  Patient presents with  . Follow-up    Pt states she has become slightly more SOB since last OV in 02/2014. Pt states she thinks her tumor has grown. Pt c/o mild cough with intermittent mucus prod with clear mucus. Pt denies CP/tightness.     Follow-up moderate COPD.  COPD has been stable. COPD cat score is 16 and similar to baseline. She is up-to-date with her vaccines. She is compliant with her COPD medication regimen. She's not had any interim COPD exacerbations or admissions to the hospital.  In terms of past medical history: She had a CT scan of the chest for lung cancer on 09/02/2014. She tells me that this is all in remission but the report suggests some possible recurrence around her airways. Review of her oncologist note from 09/04/2014. Shows that the patient is aware of possible persistent recurrent disease. She is currently on chemotherapy and it looks like she will continue with the same   CAT COPD Symptom and Quality of Life Score ([employer]) 03/03/12 02/07/2013  03/05/2014  09/05/2014   Never Cough -> Cough all the time 4 3 3 4   No phlegm in chest -> Chest is full of phlegm 3 3 2 3   No chest tightness -> Chest feels very tight 2 1 0 1  No dyspnea for 1 flight stairs/hill -> Very dyspneic for 1 flight of stairs 4 3 2 3   No limitations for ADL at home -> Very limited with ADL at home 3 2 1 1   Confident leaving home -> Not at all confident leaving home 0 0 0 0  Sleep soundly -> Do not sleep soundly because of lung condition 1 0 0 0  Lots of Energy -> No energy at all 4 2 2 4   TOTAL Score (max 40)  21 14 10 16                Review of Systems  Constitutional: Negative for fever and unexpected weight change.  HENT: Negative for congestion, dental problem, ear pain, nosebleeds, postnasal drip, rhinorrhea, sinus pressure, sneezing, sore throat and trouble swallowing.   Eyes: Negative for redness and itching.  Respiratory: Positive for cough and shortness of breath. Negative for chest tightness and wheezing.   Cardiovascular: Negative for palpitations and leg swelling.  Gastrointestinal: Positive for nausea. Negative for vomiting.  Genitourinary: Negative for dysuria.  Musculoskeletal: Negative for joint swelling.  Skin: Negative for rash.  Neurological: Negative for headaches.  Hematological: Does not bruise/bleed easily.  Psychiatric/Behavioral: Negative for dysphoric mood. The patient is not nervous/anxious.  Objective:   Physical Exam  Constitutional: She is oriented to person, place, and time. She appears well-developed and well-nourished. No distress.  HENT:  Head: Normocephalic and atraumatic.  Right Ear: External ear normal.  Left Ear: External ear normal.  Mouth/Throat: Oropharynx is clear and moist. No oropharyngeal exudate.  Eyes: Conjunctivae and EOM are normal. Pupils are equal, round, and reactive to light. Right eye exhibits no discharge. Left eye exhibits no discharge. No scleral icterus.  Neck: Normal range of motion. Neck supple. No JVD present. No tracheal deviation present. No thyromegaly present.  Cardiovascular: Normal rate, regular rhythm, normal heart sounds and intact distal pulses.  Exam reveals no gallop and no friction rub.   No murmur heard. Pulmonary/Chest: Effort normal and breath sounds normal. No respiratory distress. She has no wheezes. She has no rales. She exhibits no tenderness.  Abdominal: Soft. Bowel sounds are normal. She exhibits no distension and no mass. There is no tenderness. There is no rebound and no guarding.  Musculoskeletal: Normal range of  motion. She exhibits no edema or tenderness.  Lymphadenopathy:    She has no cervical adenopathy.  Neurological: She is alert and oriented to person, place, and time. She has normal reflexes. No cranial nerve deficit. She exhibits normal muscle tone. Coordination normal.  Skin: Skin is warm and dry. No rash noted. She is not diaphoretic. No erythema. No pallor.  Psychiatric: She has a normal mood and affect. Her behavior is normal. Judgment and thought content normal.  Vitals reviewed.   Filed Vitals:   09/05/14 1518  BP: 150/76  Pulse: 114  Height: 5\' 7"  (1.702 m)  Weight: 188 lb (85.276 kg)  SpO2: 96%         Assessment & Plan:     ICD-9-CM ICD-10-CM   1. COPD, moderate 496 J44.9    #COPD - currently stable  -  continue your symbicort and atrovent mdi/nebs;  will  ensurerefill -Glad you are uptodate with pulmonary vaccines  #followup  6 mnths or sooner if neeed CAT score at followup    Dr. Brand Males, M.D., [employer]C.P Pulmonary and Critical Care Medicine Staff Physician [facility] Pager: [phone], If no answer or between  15:00h - 7:00h: call [phone]  09/05/2014 3:55 PM

## 2014-09-05 NOTE — Patient Instructions (Addendum)
#  COPD - currently stable  -  continue your symbicort and atrovent mdi/nebs;  will  ensurerefill -Glad you are uptodate with pulmonary vaccines  #followup  6 mnths or sooner if neeed CAT score at followup

## 2014-09-18 ENCOUNTER — Telehealth: Payer: Self-pay | Admitting: *Deleted

## 2014-09-18 ENCOUNTER — Ambulatory Visit (HOSPITAL_BASED_OUTPATIENT_CLINIC_OR_DEPARTMENT_OTHER): Payer: Medicare Other

## 2014-09-18 ENCOUNTER — Other Ambulatory Visit (HOSPITAL_BASED_OUTPATIENT_CLINIC_OR_DEPARTMENT_OTHER): Payer: Medicare Other

## 2014-09-18 DIAGNOSIS — C3411 Malignant neoplasm of upper lobe, right bronchus or lung: Secondary | ICD-10-CM

## 2014-09-18 DIAGNOSIS — C3491 Malignant neoplasm of unspecified part of right bronchus or lung: Secondary | ICD-10-CM

## 2014-09-18 DIAGNOSIS — Z5112 Encounter for antineoplastic immunotherapy: Secondary | ICD-10-CM

## 2014-09-18 LAB — CBC WITH DIFFERENTIAL/PLATELET
BASO%: 1.3 % (ref 0.0–2.0)
Basophils Absolute: 0.1 10*3/uL (ref 0.0–0.1)
EOS%: 2.4 % (ref 0.0–7.0)
Eosinophils Absolute: 0.2 10*3/uL (ref 0.0–0.5)
HCT: 38.4 % (ref 34.8–46.6)
HGB: 12.3 g/dL (ref 11.6–15.9)
LYMPH#: 1.1 10*3/uL (ref 0.9–3.3)
LYMPH%: 15.1 % (ref 14.0–49.7)
MCH: 27.5 pg (ref 25.1–34.0)
MCHC: 32 g/dL (ref 31.5–36.0)
MCV: 85.9 fL (ref 79.5–101.0)
MONO#: 0.8 10*3/uL (ref 0.1–0.9)
MONO%: 10.9 % (ref 0.0–14.0)
NEUT#: 5.3 10*3/uL (ref 1.5–6.5)
NEUT%: 70.3 % (ref 38.4–76.8)
Platelets: 175 10*3/uL (ref 145–400)
RBC: 4.47 10*6/uL (ref 3.70–5.45)
RDW: 17.9 % — AB (ref 11.2–14.5)
WBC: 7.5 10*3/uL (ref 3.9–10.3)

## 2014-09-18 LAB — COMPREHENSIVE METABOLIC PANEL (CC13)
ALT: 14 U/L (ref 0–55)
ANION GAP: 12 meq/L — AB (ref 3–11)
AST: 16 U/L (ref 5–34)
Albumin: 3.6 g/dL (ref 3.5–5.0)
Alkaline Phosphatase: 75 U/L (ref 40–150)
BUN: 6.9 mg/dL — ABNORMAL LOW (ref 7.0–26.0)
CALCIUM: 9 mg/dL (ref 8.4–10.4)
CO2: 25 meq/L (ref 22–29)
Chloride: 103 mEq/L (ref 98–109)
Creatinine: 0.7 mg/dL (ref 0.6–1.1)
Glucose: 188 mg/dl — ABNORMAL HIGH (ref 70–140)
Potassium: 4.3 mEq/L (ref 3.5–5.1)
SODIUM: 140 meq/L (ref 136–145)
TOTAL PROTEIN: 7.2 g/dL (ref 6.4–8.3)
Total Bilirubin: 0.27 mg/dL (ref 0.20–1.20)

## 2014-09-18 MED ORDER — HEPARIN SOD (PORK) LOCK FLUSH 100 UNIT/ML IV SOLN
500.0000 [IU] | Freq: Once | INTRAVENOUS | Status: AC | PRN
  Administered 2014-09-18: 500 [IU]
  Filled 2014-09-18: qty 5

## 2014-09-18 MED ORDER — SODIUM CHLORIDE 0.9 % IJ SOLN
10.0000 mL | INTRAMUSCULAR | Status: DC | PRN
  Administered 2014-09-18: 10 mL
  Filled 2014-09-18: qty 10

## 2014-09-18 MED ORDER — NIVOLUMAB CHEMO INJECTION 100 MG/10ML
2.9000 mg/kg | Freq: Once | INTRAVENOUS | Status: AC
  Administered 2014-09-18: 240 mg via INTRAVENOUS
  Filled 2014-09-18: qty 24

## 2014-09-18 MED ORDER — SODIUM CHLORIDE 0.9 % IV SOLN
Freq: Once | INTRAVENOUS | Status: AC
  Administered 2014-09-18: 10:00:00 via INTRAVENOUS

## 2014-09-18 NOTE — Telephone Encounter (Signed)
Pt in the lobby wanting to speak to this RN.  She states she feels like her percocet is causing her eyes to get puffy.  She has been on percocet since 9/29.  Per Dr Vista Mink, it is proabably not r/t percocet, take zytrec and continue to monitor.  Will re-evaluate at next f/u visit.  Call if anything changes sooner.  Pt verbalized understanding

## 2014-09-18 NOTE — Patient Instructions (Signed)
Janice Ball Discharge Instructions for Patients Receiving Chemotherapy  Today you received the following chemotherapy agents Nivolomab.  To help prevent nausea and vomiting after your treatment, we encourage you to take your nausea medication as prescribed   If you develop nausea and vomiting that is not controlled by your nausea medication, call the clinic.   BELOW ARE SYMPTOMS THAT SHOULD BE REPORTED IMMEDIATELY:  *FEVER GREATER THAN 100.5 F  *CHILLS WITH OR WITHOUT FEVER  NAUSEA AND VOMITING THAT IS NOT CONTROLLED WITH YOUR NAUSEA MEDICATION  *UNUSUAL SHORTNESS OF BREATH  *UNUSUAL BRUISING OR BLEEDING  TENDERNESS IN MOUTH AND THROAT WITH OR WITHOUT PRESENCE OF ULCERS  *URINARY PROBLEMS  *BOWEL PROBLEMS  UNUSUAL RASH Items with * indicate a potential emergency and should be followed up as soon as possible.  Feel free to call the clinic you have any questions or concerns. The clinic phone number is [phone].   Nivolumab injection What is this medicine? NIVOLUMAB (nye VOL ue mab) is used to treat certain types of melanoma and lung cancer. This medicine may be used for other purposes; ask your health care provider or pharmacist if you have questions. COMMON BRAND NAME(S): Opdivo What should I tell my health care provider before I take this medicine? They need to know if you have any of these conditions: -eye disease, vision problems -history of pancreatitis -immune system problems -inflammatory bowel disease -kidney disease -liver disease -lung disease -lupus -myasthenia gravis -multiple sclerosis -organ transplant -stomach or intestine problems -thyroid disease -tingling of the fingers or toes, or other nerve disorder -an unusual or allergic reaction to nivolumab, other medicines, foods, dyes, or preservatives -pregnant or trying to get pregnant -breast-feeding How should I use this medicine? This medicine is for infusion into a vein. It  is given by a health care professional in a hospital or clinic setting. A special MedGuide will be given to you before each treatment. Be sure to read this information carefully each time. Talk to your pediatrician regarding the use of this medicine in children. Special care may be needed. Overdosage: If you think you've taken too much of this medicine contact a poison control center or emergency room at once. Overdosage: If you think you have taken too much of this medicine contact a poison control center or emergency room at once. NOTE: This medicine is only for you. Do not share this medicine with others. What if I miss a dose? It is important not to miss your dose. Call your doctor or health care professional if you are unable to keep an appointment. What may interact with this medicine? Interactions have not been studied. This list may not describe all possible interactions. Give your health care provider a list of all the medicines, herbs, non-prescription drugs, or dietary supplements you use. Also tell them if you smoke, drink alcohol, or use illegal drugs. Some items may interact with your medicine. What should I watch for while using this medicine? Tell your doctor or healthcare professional if your symptoms do not start to get better or if they get worse. Your condition will be monitored carefully while you are receiving this medicine. You may need blood work done while you are taking this medicine. What side effects may I notice from receiving this medicine? Side effects that you should report to your doctor or health care professional as soon as possible: -allergic reactions like skin rash, itching or hives, swelling of the face, lips, or tongue -black, tarry stools -bloody  or watery diarrhea -changes in vision -chills -cough -depressed mood -eye pain -feeling anxious -fever -general ill feeling or flu-like symptoms -hair loss -loss of appetite -low blood counts - this  medicine may decrease the number of white blood cells, red blood cells and platelets. You may be at increased risk for infections and bleeding -pain, tingling, numbness in the hands or feet -redness, blistering, peeling or loosening of the skin, including inside the mouth -red pinpoint spots on skin -signs of decreased platelets or bleeding - bruising, pinpoint red spots on the skin, black, tarry stools, blood in the urine -signs of decreased red blood cells - unusually weak or tired, feeling faint or lightheaded, falls -signs of infection - fever or chills, cough, sore throat, pain or trouble passing urine -signs and symptoms of a dangerous change in heartbeat or heart rhythm like chest pain; dizziness; fast or irregular heartbeat; palpitations; feeling faint or lightheaded, falls; breathing problems -signs and symptoms of high blood sugar such as dizziness; dry mouth; dry skin; fruity breath; nausea; stomach pain; increased hunger or thirst; increased urination -signs and symptoms of kidney injury like trouble passing urine or change in the amount of urine -signs and symptoms of liver injury like dark yellow or brown urine; general ill feeling or flu-like symptoms; light-colored stools; loss of appetite; nausea; right upper belly pain; unusually weak or tired; yellowing of the eyes or skin -signs and symptoms of increased potassium like muscle weakness; chest pain; or fast, irregular heartbeat -signs and symptoms of low potassium like muscle cramps or muscle pain; chest pain; dizziness; feeling faint or lightheaded, falls; palpitations; breathing problems; or fast, irregular heartbeat -swelling of the ankles, feet, hands -weight gainSide effects that usually do not require medical attention (report to your doctor or health care professional if they continue or are bothersome): -constipation -general ill feeling or flu-like symptoms -hair loss -loss of appetite -nausea, vomiting This list may  not describe all possible side effects. Call your doctor for medical advice about side effects. You may report side effects to FDA at 1-800-FDA-1088. Where should I keep my medicine? This drug is given in a hospital or clinic and will not be stored at home. NOTE: This sheet is a summary. It may not cover all possible information. If you have questions about this medicine, talk to your doctor, pharmacist, or health care provider.  2015, [employer]. (2013-12-24 13:18:19)

## 2014-09-20 ENCOUNTER — Emergency Department (HOSPITAL_COMMUNITY)
Admission: EM | Admit: 2014-09-20 | Discharge: 2014-09-20 | Disposition: A | Discharge status: Home / Self Care | Payer: No Typology Code available for payment source | Attending: Emergency Medicine | Admitting: Emergency Medicine

## 2014-09-20 ENCOUNTER — Emergency Department (HOSPITAL_COMMUNITY): Payer: No Typology Code available for payment source

## 2014-09-20 ENCOUNTER — Encounter (HOSPITAL_COMMUNITY): Payer: Self-pay | Admitting: Emergency Medicine

## 2014-09-20 DIAGNOSIS — E119 Type 2 diabetes mellitus without complications: Secondary | ICD-10-CM | POA: Insufficient documentation

## 2014-09-20 DIAGNOSIS — M533 Sacrococcygeal disorders, not elsewhere classified: Secondary | ICD-10-CM

## 2014-09-20 DIAGNOSIS — S3992XA Unspecified injury of lower back, initial encounter: Secondary | ICD-10-CM | POA: Insufficient documentation

## 2014-09-20 DIAGNOSIS — J449 Chronic obstructive pulmonary disease, unspecified: Secondary | ICD-10-CM | POA: Diagnosis not present

## 2014-09-20 DIAGNOSIS — Z85118 Personal history of other malignant neoplasm of bronchus and lung: Secondary | ICD-10-CM | POA: Diagnosis not present

## 2014-09-20 DIAGNOSIS — Y998 Other external cause status: Secondary | ICD-10-CM | POA: Diagnosis not present

## 2014-09-20 DIAGNOSIS — Z7951 Long term (current) use of inhaled steroids: Secondary | ICD-10-CM | POA: Diagnosis not present

## 2014-09-20 DIAGNOSIS — Z87891 Personal history of nicotine dependence: Secondary | ICD-10-CM | POA: Diagnosis not present

## 2014-09-20 DIAGNOSIS — S139XXA Sprain of joints and ligaments of unspecified parts of neck, initial encounter: Secondary | ICD-10-CM | POA: Diagnosis not present

## 2014-09-20 DIAGNOSIS — F419 Anxiety disorder, unspecified: Secondary | ICD-10-CM | POA: Insufficient documentation

## 2014-09-20 DIAGNOSIS — Z880 Allergy status to penicillin: Secondary | ICD-10-CM | POA: Diagnosis not present

## 2014-09-20 DIAGNOSIS — Z7982 Long term (current) use of aspirin: Secondary | ICD-10-CM | POA: Diagnosis not present

## 2014-09-20 DIAGNOSIS — Y92410 Unspecified street and highway as the place of occurrence of the external cause: Secondary | ICD-10-CM | POA: Diagnosis not present

## 2014-09-20 DIAGNOSIS — E039 Hypothyroidism, unspecified: Secondary | ICD-10-CM | POA: Insufficient documentation

## 2014-09-20 DIAGNOSIS — Y9389 Activity, other specified: Secondary | ICD-10-CM | POA: Insufficient documentation

## 2014-09-20 DIAGNOSIS — M1990 Unspecified osteoarthritis, unspecified site: Secondary | ICD-10-CM | POA: Insufficient documentation

## 2014-09-20 DIAGNOSIS — Z923 Personal history of irradiation: Secondary | ICD-10-CM | POA: Insufficient documentation

## 2014-09-20 DIAGNOSIS — S199XXA Unspecified injury of neck, initial encounter: Secondary | ICD-10-CM | POA: Diagnosis present

## 2014-09-20 DIAGNOSIS — Z79899 Other long term (current) drug therapy: Secondary | ICD-10-CM | POA: Diagnosis not present

## 2014-09-20 MED ORDER — CYCLOBENZAPRINE HCL 10 MG PO TABS: 10.0000 mg | ORAL_TABLET | Freq: Two times a day (BID) | ORAL | Status: DC | PRN
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
## 2014-09-20 NOTE — ED Provider Notes (Signed)
Patient signed out to me by Alyse Low, PA-C with plan to follow-up on radiographs.  Patient well-appearing and in mild distress from pain.  PE: Constitutional: well-developed, well-nourished, no apparent distress HENT: normocephalic, atraumatic Cardiovascular: normal rate and rhythm, distal pulses intact Pulmonary/Chest: effort normal; breath sounds clear and equal bilaterally; no wheezes or rales Abdominal: soft and nontender Musculoskeletal: full ROM, pain to palpation of right lateral neck. No cervical spinous process tenderness. Mild pain and right shoulder, pain to coccygeal sacral region. Lymphadenopathy: no cervical adenopathy Neurological: Fully alert answering questions appropriately in full, clear sentences. Motor strength 5 out of 5 in all major muscle groups of upper and lower extremities. Gait normal, distal sensation intact. Skin: warm and dry, no rash, no diaphoresis Psychiatric: normal mood and affect, normal behavior   Radiographs unremarkable for any acute pathology. Recommended doughnut pillow, we'll prescribe Flexeril. I encouraged follow-up with her primary care provider. Patient states she has a supply of Percocet, and I encouraged use Percocet and ibuprofen for pain. I discussed return precautions with patient and encourage her to call or return to the ER should she have any questions or concerns.  BP 143/77 mmHg  Pulse 110  Temp(Src) 98.2 F (36.8 C) (Oral)  Resp 18  SpO2 100%  Signed,  Dahlia Bailiff, PA-C 7:35 AM   Carrie Mew, PA-C 09/21/14 [id number]  Charlesetta Shanks, MD 09/26/14 [phone]
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
## 2014-10-02 ENCOUNTER — Encounter: Payer: Self-pay | Admitting: Physician Assistant

## 2014-10-02 ENCOUNTER — Other Ambulatory Visit (HOSPITAL_BASED_OUTPATIENT_CLINIC_OR_DEPARTMENT_OTHER): Payer: Medicare Other

## 2014-10-02 ENCOUNTER — Ambulatory Visit (HOSPITAL_BASED_OUTPATIENT_CLINIC_OR_DEPARTMENT_OTHER): Payer: Medicare Other | Admitting: Physician Assistant

## 2014-10-02 ENCOUNTER — Ambulatory Visit (HOSPITAL_BASED_OUTPATIENT_CLINIC_OR_DEPARTMENT_OTHER): Payer: Medicare Other

## 2014-10-02 VITALS — BP 137/75 | HR 116 | Temp 98.2°F | Resp 18 | Ht 67.0 in | Wt 184.0 lb

## 2014-10-02 DIAGNOSIS — C3481 Malignant neoplasm of overlapping sites of right bronchus and lung: Secondary | ICD-10-CM

## 2014-10-02 DIAGNOSIS — C3411 Malignant neoplasm of upper lobe, right bronchus or lung: Secondary | ICD-10-CM

## 2014-10-02 DIAGNOSIS — Z79899 Other long term (current) drug therapy: Secondary | ICD-10-CM

## 2014-10-02 DIAGNOSIS — C3491 Malignant neoplasm of unspecified part of right bronchus or lung: Secondary | ICD-10-CM

## 2014-10-02 DIAGNOSIS — Z5112 Encounter for antineoplastic immunotherapy: Secondary | ICD-10-CM

## 2014-10-02 LAB — CBC WITH DIFFERENTIAL/PLATELET
BASO%: 1.3 % (ref 0.0–2.0)
Basophils Absolute: 0.1 10*3/uL (ref 0.0–0.1)
EOS ABS: 0.3 10*3/uL (ref 0.0–0.5)
EOS%: 3.1 % (ref 0.0–7.0)
HEMATOCRIT: 36.3 % (ref 34.8–46.6)
HGB: 11.5 g/dL — ABNORMAL LOW (ref 11.6–15.9)
LYMPH#: 0.9 10*3/uL (ref 0.9–3.3)
LYMPH%: 10.9 % — ABNORMAL LOW (ref 14.0–49.7)
MCH: 26.8 pg (ref 25.1–34.0)
MCHC: 31.8 g/dL (ref 31.5–36.0)
MCV: 84.4 fL (ref 79.5–101.0)
MONO#: 0.5 10*3/uL (ref 0.1–0.9)
MONO%: 5.7 % (ref 0.0–14.0)
NEUT#: 6.9 10*3/uL — ABNORMAL HIGH (ref 1.5–6.5)
NEUT%: 79 % — AB (ref 38.4–76.8)
Platelets: 217 10*3/uL (ref 145–400)
RBC: 4.3 10*6/uL (ref 3.70–5.45)
RDW: 18.7 % — AB (ref 11.2–14.5)
WBC: 8.7 10*3/uL (ref 3.9–10.3)

## 2014-10-02 LAB — COMPREHENSIVE METABOLIC PANEL (CC13)
ALT: 12 U/L (ref 0–55)
AST: 16 U/L (ref 5–34)
Albumin: 3.4 g/dL — ABNORMAL LOW (ref 3.5–5.0)
Alkaline Phosphatase: 71 U/L (ref 40–150)
Anion Gap: 12 mEq/L — ABNORMAL HIGH (ref 3–11)
BILIRUBIN TOTAL: 0.29 mg/dL (ref 0.20–1.20)
BUN: 7.8 mg/dL (ref 7.0–26.0)
CO2: 27 meq/L (ref 22–29)
Calcium: 8.6 mg/dL (ref 8.4–10.4)
Chloride: 100 mEq/L (ref 98–109)
Creatinine: 0.8 mg/dL (ref 0.6–1.1)
EGFR: 85 mL/min/{1.73_m2} — ABNORMAL LOW (ref >90)
Glucose: 201 mg/dl — ABNORMAL HIGH (ref 70–140)
Potassium: 4.1 mEq/L (ref 3.5–5.1)
Sodium: 138 mEq/L (ref 136–145)
Total Protein: 7.1 g/dL (ref 6.4–8.3)

## 2014-10-02 LAB — TSH CHCC: TSH: 5.639 m(IU)/L — ABNORMAL HIGH (ref 0.308–3.960)

## 2014-10-02 MED ORDER — SODIUM CHLORIDE 0.9 % IV SOLN
Freq: Once | INTRAVENOUS | Status: AC
  Administered 2014-10-02: 14:00:00 via INTRAVENOUS

## 2014-10-02 MED ORDER — HEPARIN SOD (PORK) LOCK FLUSH 100 UNIT/ML IV SOLN
500.0000 [IU] | Freq: Once | INTRAVENOUS | Status: AC | PRN
  Administered 2014-10-02: 500 [IU]
  Filled 2014-10-02: qty 5

## 2014-10-02 MED ORDER — SODIUM CHLORIDE 0.9 % IV SOLN: 3.0000 mg/kg | Freq: Once | INTRAVENOUS | Status: DC

## 2014-10-02 MED ORDER — SODIUM CHLORIDE 0.9 % IV SOLN
2.9000 mg/kg | Freq: Once | INTRAVENOUS | Status: AC
  Administered 2014-10-02: 240 mg via INTRAVENOUS
  Filled 2014-10-02: qty 20

## 2014-10-02 MED ORDER — SODIUM CHLORIDE 0.9 % IJ SOLN
10.0000 mL | INTRAMUSCULAR | Status: DC | PRN
Start: 2014-10-02 — End: 2014-10-02
  Administered 2014-10-02: 10 mL
  Filled 2014-10-02: qty 10

## 2014-10-02 MED ORDER — OXYCODONE-ACETAMINOPHEN 5-325 MG PO TABS: 1.0000 | ORAL_TABLET | Freq: Four times a day (QID) | ORAL | Status: DC | PRN
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
## 2014-10-02 NOTE — Patient Instructions (Signed)
Take your inhalers and nebulizer treatments as prescribed Follow-up in 2 weeks

## 2014-10-02 NOTE — Patient Instructions (Signed)
[facility] Discharge Instructions for Patients Receiving Chemotherapy  Today you received the following chemotherapy agents: Nivolumab.  To help prevent nausea and vomiting after your treatment, we encourage you to take your nausea medication as needed.    If you develop nausea and vomiting that is not controlled by your nausea medication, call the clinic.   If you develop diarrhea, take Imodium. For diarrhea that is not controlled by  Imodium, call your doctor.  BELOW ARE SYMPTOMS THAT SHOULD BE REPORTED IMMEDIATELY:  *FEVER GREATER THAN 100.5 F  *CHILLS WITH OR WITHOUT FEVER  NAUSEA AND VOMITING THAT IS NOT CONTROLLED WITH YOUR NAUSEA MEDICATION  *UNUSUAL SHORTNESS OF BREATH  *UNUSUAL BRUISING OR BLEEDING  TENDERNESS IN MOUTH AND THROAT WITH OR WITHOUT PRESENCE OF ULCERS  *URINARY PROBLEMS  *BOWEL PROBLEMS  UNUSUAL RASH Items with * indicate a potential emergency and should be followed up as soon as possible.  Feel free to call the clinic should you have any questions or concerns. The clinic phone number is [phone].

## 2014-10-03 ENCOUNTER — Other Ambulatory Visit: Payer: Self-pay | Admitting: Internal Medicine

## 2014-10-03 DIAGNOSIS — C3400 Malignant neoplasm of unspecified main bronchus: Secondary | ICD-10-CM

## 2014-10-08 ENCOUNTER — Other Ambulatory Visit: Payer: Self-pay | Admitting: Internal Medicine

## 2014-10-09 ENCOUNTER — Emergency Department (HOSPITAL_COMMUNITY): Payer: Medicare Other

## 2014-10-09 ENCOUNTER — Encounter (HOSPITAL_COMMUNITY): Payer: Self-pay | Admitting: Emergency Medicine

## 2014-10-09 ENCOUNTER — Emergency Department (HOSPITAL_COMMUNITY)
Admission: EM | Admit: 2014-10-09 | Discharge: 2014-10-09 | Disposition: A | Discharge status: Home / Self Care | Payer: Medicare Other | Attending: Emergency Medicine | Admitting: Emergency Medicine

## 2014-10-09 DIAGNOSIS — R0609 Other forms of dyspnea: Secondary | ICD-10-CM

## 2014-10-09 DIAGNOSIS — F419 Anxiety disorder, unspecified: Secondary | ICD-10-CM | POA: Diagnosis not present

## 2014-10-09 DIAGNOSIS — E785 Hyperlipidemia, unspecified: Secondary | ICD-10-CM | POA: Insufficient documentation

## 2014-10-09 DIAGNOSIS — J441 Chronic obstructive pulmonary disease with (acute) exacerbation: Secondary | ICD-10-CM | POA: Insufficient documentation

## 2014-10-09 DIAGNOSIS — Z7982 Long term (current) use of aspirin: Secondary | ICD-10-CM | POA: Insufficient documentation

## 2014-10-09 DIAGNOSIS — M1990 Unspecified osteoarthritis, unspecified site: Secondary | ICD-10-CM | POA: Insufficient documentation

## 2014-10-09 DIAGNOSIS — R220 Localized swelling, mass and lump, head: Secondary | ICD-10-CM

## 2014-10-09 DIAGNOSIS — Z87891 Personal history of nicotine dependence: Secondary | ICD-10-CM | POA: Diagnosis not present

## 2014-10-09 DIAGNOSIS — C3411 Malignant neoplasm of upper lobe, right bronchus or lung: Secondary | ICD-10-CM | POA: Diagnosis not present

## 2014-10-09 DIAGNOSIS — E039 Hypothyroidism, unspecified: Secondary | ICD-10-CM | POA: Diagnosis not present

## 2014-10-09 DIAGNOSIS — R000 Tachycardia, unspecified: Secondary | ICD-10-CM | POA: Insufficient documentation

## 2014-10-09 DIAGNOSIS — Z880 Allergy status to penicillin: Secondary | ICD-10-CM | POA: Insufficient documentation

## 2014-10-09 DIAGNOSIS — R0602 Shortness of breath: Secondary | ICD-10-CM

## 2014-10-09 DIAGNOSIS — E119 Type 2 diabetes mellitus without complications: Secondary | ICD-10-CM | POA: Diagnosis not present

## 2014-10-09 DIAGNOSIS — Z79899 Other long term (current) drug therapy: Secondary | ICD-10-CM | POA: Insufficient documentation

## 2014-10-09 LAB — COMPREHENSIVE METABOLIC PANEL
ALT: 13 U/L (ref 0–35)
ANION GAP: 10 (ref 5–15)
AST: 22 U/L (ref 0–37)
Albumin: 3.7 g/dL (ref 3.5–5.2)
Alkaline Phosphatase: 72 U/L (ref 39–117)
BILIRUBIN TOTAL: 0.5 mg/dL (ref 0.3–1.2)
BUN: 6 mg/dL (ref 6–23)
CHLORIDE: 102 meq/L (ref 96–112)
CO2: 22 mmol/L (ref 19–32)
Calcium: 8.5 mg/dL (ref 8.4–10.5)
Creatinine, Ser: 0.53 mg/dL (ref 0.50–1.10)
GFR calc non Af Amer: >90 mL/min (ref >90)
GLUCOSE: 168 mg/dL — AB (ref 70–99)
POTASSIUM: 3.5 mmol/L (ref 3.5–5.1)
SODIUM: 134 mmol/L — AB (ref 135–145)
Total Protein: 7.4 g/dL (ref 6.0–8.3)

## 2014-10-09 LAB — URINALYSIS, ROUTINE W REFLEX MICROSCOPIC
Bilirubin Urine: NEGATIVE
Glucose, UA: NEGATIVE mg/dL
HGB URINE DIPSTICK: NEGATIVE
KETONES UR: NEGATIVE mg/dL
Leukocytes, UA: NEGATIVE
NITRITE: NEGATIVE
PH: 6 (ref 5.0–8.0)
Protein, ur: NEGATIVE mg/dL
Specific Gravity, Urine: 1.005 (ref 1.005–1.030)
Urobilinogen, UA: 0.2 mg/dL (ref 0.0–1.0)

## 2014-10-09 LAB — CBC WITH DIFFERENTIAL/PLATELET
Basophils Absolute: 0 10*3/uL (ref 0.0–0.1)
Basophils Relative: 0 % (ref 0–1)
Eosinophils Absolute: 0.2 10*3/uL (ref 0.0–0.7)
Eosinophils Relative: 2 % (ref 0–5)
HCT: 34.6 % — ABNORMAL LOW (ref 36.0–46.0)
Hemoglobin: 10.8 g/dL — ABNORMAL LOW (ref 12.0–15.0)
LYMPHS ABS: 0.8 10*3/uL (ref 0.7–4.0)
Lymphocytes Relative: 11 % — ABNORMAL LOW (ref 12–46)
MCH: 27 pg (ref 26.0–34.0)
MCHC: 31.2 g/dL (ref 30.0–36.0)
MCV: 86.5 fL (ref 78.0–100.0)
MONOS PCT: 7 % (ref 3–12)
Monocytes Absolute: 0.5 10*3/uL (ref 0.1–1.0)
NEUTROS PCT: 80 % — AB (ref 43–77)
Neutro Abs: 5.9 10*3/uL (ref 1.7–7.7)
PLATELETS: 218 10*3/uL (ref 150–400)
RBC: 4 MIL/uL (ref 3.87–5.11)
RDW: 17.3 % — ABNORMAL HIGH (ref 11.5–15.5)
WBC: 7.4 10*3/uL (ref 4.0–10.5)

## 2014-10-09 LAB — BRAIN NATRIURETIC PEPTIDE: B NATRIURETIC PEPTIDE 5: 23.5 pg/mL (ref 0.0–100.0)

## 2014-10-09 LAB — I-STAT CG4 LACTIC ACID, ED: Lactic Acid, Venous: 2.53 mmol/L — ABNORMAL HIGH (ref 0.5–2.2)

## 2014-10-09 MED ORDER — SODIUM CHLORIDE 0.9 % IV BOLUS (SEPSIS)
500.0000 mL | Freq: Once | INTRAVENOUS | Status: AC
  Administered 2014-10-09: 500 mL via INTRAVENOUS

## 2014-10-09 MED ORDER — IOHEXOL 350 MG/ML SOLN
100.0000 mL | Freq: Once | INTRAVENOUS | Status: AC | PRN
  Administered 2014-10-09: 100 mL via INTRAVENOUS

## 2014-10-09 MED ORDER — IPRATROPIUM-ALBUTEROL 0.5-2.5 (3) MG/3ML IN SOLN
3.0000 mL | Freq: Once | RESPIRATORY_TRACT | Status: AC
  Administered 2014-10-09: 3 mL via RESPIRATORY_TRACT
  Filled 2014-10-09: qty 3

## 2014-10-09 NOTE — ED Notes (Addendum)
Pt c/o SOB that started today, states worse with exertion. Denies pain but states when she turns her head " It cuts everything off". Ask what gets cutoff and she said her breathing. Pt has COPD and states she has had 2 breathing tx w/o relief.

## 2014-10-09 NOTE — ED Notes (Signed)
Pt received immunotherapy on 12/2.

## 2014-10-09 NOTE — ED Provider Notes (Signed)
CSN: [id number]     Arrival date & time 10/09/14  1408 History   First MD Initiated Contact with Patient 10/09/14 1459     Chief Complaint  Patient presents with  . chemo card, lung ca   . Shortness of Breath    Patient is a 58 y.o. female presenting with shortness of breath. The history is provided by the patient. No language interpreter was used.  Shortness of Breath  Ms. Janice Ball is here for evaluation of progressive dyspnea.  She reports 2-3 days of increased shortness of breath, cough and dyspnea on exertion. She feels like this is coming from her chest and her throat. She denies any chest pain, abdominal pain, leg pain or swelling. She reports temperatures of 99.6 at home. She is currently getting treated for lung cancer with chemotherapy. She has no history of blood clots and she takes no blood thinners. She states that her face has been swollen for the last couple of weeks, the swelling is worse in the mornings. Her sxs are moderate, worse with exertion, worsening. Her last treatment was December 16. She is followed by Dr. Inda Merlin.  Past Medical History  Diagnosis Date  . Anxiety   . Hyperlipidemia   . Hypothyroidism   . COPD (chronic obstructive pulmonary disease)   . Arthritis     thumbs  . 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 Diabetes mellitus   . Lung cancer dx'd 05/2009  . Lung cancer dx'd 09/2013    recurrence in LN   Past Surgical History  Procedure Laterality Date  . Thyroidectomy, partial    . Tonsillectomy    . Tubal ligation    . Partial hysterectomy     Family History  Problem Relation Age of Onset  . Colon cancer Mother   . Cancer Mother     colon  . Diabetes type II Father    History  Substance Use Topics  . Smoking status: Former Smoker -- 1.00 packs/day for 45 years    Types: Cigarettes    Quit date: 02/12/2014  . Smokeless tobacco: Never Used     Comment: pt. started back smoking 10/18/2012, half a pack a day  . Alcohol Use: No    OB History    No data available     Review of Systems  Respiratory: Positive for shortness of breath.   All other systems reviewed and are negative.     Allergies  Sulfonamide derivatives; Codeine; Dilaudid; Penicillins; and Vicodin  Home Medications   Prior to Admission medications   Medication Sig Start Date End Date Taking? Authorizing Provider  acetaminophen (TYLENOL) 325 MG tablet Take 650 mg by mouth every 6 (six) hours as needed for mild pain or headache.     Historical Provider, MD  alendronate (FOSAMAX) 10 MG tablet Take 10 mg by mouth daily.  03/20/14   Historical Provider, MD  aspirin 325 MG tablet Take 325 mg by mouth at bedtime.     Historical Provider, MD  cholecalciferol (VITAMIN D) 1000 UNITS tablet Take 1,000 Units by mouth every morning.     Historical Provider, MD  Chromium 1000 MCG TABS Take 1 tablet by mouth every morning.     Historical Provider, MD  cyanocobalamin 1000 MCG tablet Take 1,000 mcg by mouth every morning.     Historical Provider, MD  cyclobenzaprine (FLEXERIL) 10 MG tablet Take 1 tablet (10 mg total) by mouth 2 (two) times daily as needed for muscle spasms. 09/20/14  Carrie Mew, PA-C  glimepiride (AMARYL) 2 MG tablet Take 2 mg by mouth daily.  12/21/13   Historical Provider, MD  guaiFENesin (MUCINEX) 600 MG 12 hr tablet Take 1,200 mg by mouth 2 (two) times daily.    Historical Provider, MD  ipratropium (ATROVENT) 0.02 % nebulizer solution Take 2.5 mLs (0.5 mg total) by nebulization 4 (four) times daily. 03/05/14   Brand Males, MD  KRILL OIL PO Take 1 tablet by mouth every morning.     Historical Provider, MD  levothyroxine (SYNTHROID, LEVOTHROID) 125 MCG tablet Take 125 mcg by mouth daily before breakfast.     Historical Provider, MD  lidocaine-prilocaine (EMLA) cream Apply 1 application topically as needed (skin numbing).    Historical Provider, MD  LORazepam (ATIVAN) 1 MG tablet Take 1 mg by mouth 3 (three) times daily as needed for  anxiety.     Historical Provider, MD  magnesium gluconate (MAGONATE) 500 MG tablet Take 500 mg by mouth every morning.     Historical Provider, MD  metFORMIN (GLUCOPHAGE) 1000 MG tablet Take 1,000 mg by mouth 2 (two) times daily with a meal.    Historical Provider, MD  Multiple Vitamin (MULTIVITAMIN WITH MINERALS) TABS tablet Take 1 tablet by mouth every morning.    Historical Provider, MD  OVER THE COUNTER MEDICATION Take 1 capsule by mouth daily. MSM. For energy and joint pain    Historical Provider, MD  oxyCODONE-acetaminophen (PERCOCET/ROXICET) 5-325 MG per tablet Take 1 tablet by mouth every 6 (six) hours as needed for severe pain. 10/02/14   Carlton Adam, PA-C  PROAIR HFA 108 (90 BASE) MCG/ACT inhaler  08/14/14   Historical Provider, MD  PROAIR HFA 108 (90 BASE) MCG/ACT inhaler INHALE 2 PUFFS INTO THE LUNGS EVERY 6 (SIX) HOURS AS NEEDED FOR WHEEZING OR SHORTNESS OF BREATH. 10/08/14   Brand Males, MD  prochlorperazine (COMPAZINE) 10 MG tablet TAKE 1 TABLET (10 MG TOTAL) BY MOUTH EVERY 6 (SIX) HOURS AS NEEDED FOR NAUSEA OR VOMITING. 10/03/14   Curt Bears, MD  sertraline (ZOLOFT) 100 MG tablet Take 150 mg by mouth at bedtime.     Historical Provider, MD  spironolactone (ALDACTONE) 25 MG tablet Take 25 mg by mouth every morning.     Historical Provider, MD  SYMBICORT 160-4.5 MCG/ACT inhaler INHALE 2 PUFFS INTO THE LUNGS 2 (TWO) TIMES DAILY. 10/08/14   Brand Males, MD  varenicline (CHANTIX) 1 MG tablet Take 1 mg by mouth 2 (two) times daily.    Historical Provider, MD   BP 126/74 mmHg  Pulse 117  Temp(Src) 97.3 F (36.3 C) (Oral)  Resp 20  SpO2 94% Physical Exam  Constitutional: She is oriented to person, place, and time. She appears well-developed and well-nourished.  HENT:  Head: Normocephalic and atraumatic.  Dry mucous membranes.  Mild swelling and erythema of periorbital region.  Cardiovascular: Regular rhythm.   No murmur heard. tachycardic  Pulmonary/Chest:   Tachypneic, occasional rhonchi bilaterally  Abdominal: Soft. There is no tenderness. There is no rebound and no guarding.  Musculoskeletal: She exhibits no edema or tenderness.  Neurological: She is alert and oriented to person, place, and time.  Skin: Skin is warm and dry.  Psychiatric: She has a normal mood and affect. Her behavior is normal.  Nursing note and vitals reviewed.   ED Course  Procedures (including critical care time) Labs Review Labs Reviewed  CBC WITH DIFFERENTIAL - Abnormal; Notable for the following:    Hemoglobin 10.8 (*)  HCT 34.6 (*)    RDW 17.3 (*)    Neutrophils Relative % 80 (*)    Lymphocytes Relative 11 (*)    All other components within normal limits  COMPREHENSIVE METABOLIC PANEL - Abnormal; Notable for the following:    Sodium 134 (*)    Glucose, Bld 168 (*)    All other components within normal limits  URINALYSIS, ROUTINE W REFLEX MICROSCOPIC - Abnormal; Notable for the following:    APPearance CLOUDY (*)    All other components within normal limits  I-STAT CG4 LACTIC ACID, ED - Abnormal; Notable for the following:    Lactic Acid, Venous 2.53 (*)    All other components within normal limits  BRAIN NATRIURETIC PEPTIDE    Imaging Review Dg Chest Port 1 View  10/09/2014   CLINICAL DATA:  Shortness of breath for 3 days, productive cough, history of lung carcinoma  EXAM: PORTABLE CHEST - 1 VIEW  COMPARISON:  09/02/2014  FINDINGS: Cardiac shadow is within normal limits. A right chest wall port is again seen with the catheter tip at the cavoatrial junction. Left lung remains clear. There are its right suprahilar changes again noted consistent with the patient's given clinical history. No new effusion or focal infiltrate is seen.  IMPRESSION: Stable changes when compared with the prior CT examination consistent with the patient's given clinical history No acute abnormality is noted.   Electronically Signed   By: Inez Catalina M.D.   On: 10/09/2014 14:51      EKG Interpretation   Date/Time:  Wednesday October 09 2014 14:12:31 EST Ventricular Rate:  126 PR Interval:  227 QRS Duration: 90 QT Interval:  338 QTC Calculation: 489 R Axis:   125 Text Interpretation:  Sinus tachycardia Prolonged PR interval Low voltage  with right axis deviation Borderline prolonged QT interval Baseline wander  in lead(s) V2 V3 V4 V5 Confirmed by Hazle Coca [phone]) on 10/09/2014  3:12:01 PM      MDM   Final diagnoses:  Dyspnea on exertion  Malignant neoplasm of upper lobe of right lung  Facial swelling    Patient here for evaluation of progressive dyspnea on exertion with history of active lung cancer currently receiving chemotherapy. Attempted albuterol treatment without any relief in her symptoms, there is no significant wheezing on exam, feel COPD exacerbation is unlikely a source of her symptoms. There is no current evidence of pneumonia, CHF, or PE. CT scan obtained which demonstrates progressive disease. In terms of facial swelling, question some element of early SVC syndrome. Discussed with oncologist on call the patient's history, exam and course. He recommends that she is evaluated in the office tomorrow. Message sent to patient's primary oncologist. Discussed with patient's importance of follow-up as well as return precautions. Patient is noted to be tachycardic in the emergency department, on review of prior visits it appears that she has been tachycardic on multiple visits to her oncology office as well as the emergency department in the past month.  Quintella Reichert, MD 10/09/14 1827

## 2014-10-09 NOTE — ED Notes (Signed)
Pt reports worsening SOB over past three days. Pt hx of lung cancer and COPD. Last chemo treatment 10/02/2014.

## 2014-10-09 NOTE — ED Notes (Signed)
[facility] aware protocols in process, lactic acid of 2.53, awaiting DG results at present time.

## 2014-10-09 NOTE — ED Notes (Signed)
Pt reports some relief post neb treatment.

## 2014-10-09 NOTE — ED Notes (Signed)
Pt assisted via wheelchair to nearby restroom to void per request.

## 2014-10-09 NOTE — Discharge Instructions (Signed)
Please contact the Oncology office in the morning to get rechecked tomorrow.  Return to the Emergency Department if you develop worsening symptoms or new concerning symptoms.     Shortness of Breath Shortness of breath means you have trouble breathing. It could also mean that you have a medical problem. You should get immediate medical care for shortness of breath. CAUSES   Not enough oxygen in the air such as with high altitudes or a smoke-filled room.  Certain lung diseases, infections, or problems.  Heart disease or conditions, such as angina or heart failure.  Low red blood cells (anemia).  Poor physical fitness, which can cause shortness of breath when you exercise.  Chest or back injuries or stiffness.  Being overweight.  Smoking.  Anxiety, which can make you feel like you are not getting enough air. DIAGNOSIS  Serious medical problems can often be found during your physical exam. Tests may also be done to determine why you are having shortness of breath. Tests may include:  Chest X-rays.  Lung function tests.  Blood tests.  An electrocardiogram (ECG).  An ambulatory electrocardiogram. An ambulatory ECG records your heartbeat patterns over a 24-hour period.  Exercise testing.  A transthoracic echocardiogram (TTE). During echocardiography, sound waves are used to evaluate how blood flows through your heart.  A transesophageal echocardiogram (TEE).  Imaging scans. Your health care provider may not be able to find a cause for your shortness of breath after your exam. In this case, it is important to have a follow-up exam with your health care provider as directed.  TREATMENT  Treatment for shortness of breath depends on the cause of your symptoms and can vary greatly. HOME CARE INSTRUCTIONS   Do not smoke. Smoking is a common cause of shortness of breath. If you smoke, ask for help to quit.  Avoid being around chemicals or things that may bother your breathing,  such as paint fumes and dust.  Rest as needed. Slowly resume your usual activities.  If medicines were prescribed, take them as directed for the full length of time directed. This includes oxygen and any inhaled medicines.  Keep all follow-up appointments as directed by your health care provider. SEEK MEDICAL CARE IF:   Your condition does not improve in the time expected.  You have a hard time doing your normal activities even with rest.  You have any new symptoms. SEEK IMMEDIATE MEDICAL CARE IF:   Your shortness of breath gets worse.  You feel light-headed, faint, or develop a cough not controlled with medicines.  You start coughing up blood.  You have pain with breathing.  You have chest pain or pain in your arms, shoulders, or abdomen.  You have a fever.  You are unable to walk up stairs or exercise the way you normally do. MAKE SURE YOU:  Understand these instructions.  Will watch your condition.  Will get help right away if you are not doing well or get worse. Document Released: 06/29/2001 Document Revised: 10/09/2013 Document Reviewed: 12/20/2011 [facility] Patient Information 2015 [place], [place]. This information is not intended to replace advice given to you by your health care provider. Make sure you discuss any questions you have with your health care provider.

## 2014-10-10 ENCOUNTER — Other Ambulatory Visit: Payer: Self-pay | Admitting: Medical Oncology

## 2014-10-10 ENCOUNTER — Ambulatory Visit: Payer: Medicare Other | Admitting: Physician Assistant

## 2014-10-10 ENCOUNTER — Telehealth: Payer: Self-pay | Admitting: Medical Oncology

## 2014-10-10 ENCOUNTER — Ambulatory Visit (HOSPITAL_BASED_OUTPATIENT_CLINIC_OR_DEPARTMENT_OTHER): Payer: Medicare Other | Admitting: Internal Medicine

## 2014-10-10 ENCOUNTER — Encounter: Payer: Self-pay | Admitting: Internal Medicine

## 2014-10-10 VITALS — BP 155/66 | HR 116 | Temp 98.2°F | Resp 18 | Ht 67.0 in | Wt 184.8 lb

## 2014-10-10 DIAGNOSIS — R609 Edema, unspecified: Secondary | ICD-10-CM

## 2014-10-10 DIAGNOSIS — C3411 Malignant neoplasm of upper lobe, right bronchus or lung: Secondary | ICD-10-CM

## 2014-10-10 DIAGNOSIS — C3481 Malignant neoplasm of overlapping sites of right bronchus and lung: Secondary | ICD-10-CM

## 2014-10-10 MED ORDER — METHYLPREDNISOLONE (PAK) 4 MG PO TABS: ORAL_TABLET | ORAL | Status: DC

## 2014-10-10 NOTE — Telephone Encounter (Signed)
Appt given to pt for today

## 2014-10-10 NOTE — Telephone Encounter (Signed)
-----   Message from Curt Bears, MD sent at 10/09/2014  7:33 PM EST ----- I can see her 12/24 if needed. ----- Message -----    From: Quintella Reichert, MD    Sent: 10/09/2014   5:45 PM      To: Curt Bears, MD  Pt seen in ED 12/23 for progressive DOE and facial swelling.  CT scan performed with progression of disease.  Would like her to get rechecked on 12/24.   Thanks, Kathlee Nations
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
## 2014-10-14 ENCOUNTER — Ambulatory Visit (HOSPITAL_BASED_OUTPATIENT_CLINIC_OR_DEPARTMENT_OTHER): Payer: Medicare Other | Admitting: Oncology

## 2014-10-14 ENCOUNTER — Telehealth: Payer: Self-pay | Admitting: Oncology

## 2014-10-14 ENCOUNTER — Encounter: Payer: Self-pay | Admitting: Oncology

## 2014-10-14 ENCOUNTER — Other Ambulatory Visit (HOSPITAL_BASED_OUTPATIENT_CLINIC_OR_DEPARTMENT_OTHER): Payer: Medicare Other

## 2014-10-14 VITALS — BP 146/70 | HR 120 | Temp 98.2°F | Resp 20 | Ht 67.0 in | Wt 182.6 lb

## 2014-10-14 DIAGNOSIS — Z79899 Other long term (current) drug therapy: Secondary | ICD-10-CM

## 2014-10-14 DIAGNOSIS — C3411 Malignant neoplasm of upper lobe, right bronchus or lung: Secondary | ICD-10-CM

## 2014-10-14 DIAGNOSIS — R609 Edema, unspecified: Secondary | ICD-10-CM

## 2014-10-14 DIAGNOSIS — C3481 Malignant neoplasm of overlapping sites of right bronchus and lung: Secondary | ICD-10-CM

## 2014-10-14 LAB — COMPREHENSIVE METABOLIC PANEL (CC13)
ALK PHOS: 79 U/L (ref 40–150)
ALT: 23 U/L (ref 0–55)
AST: 19 U/L (ref 5–34)
Albumin: 3.6 g/dL (ref 3.5–5.0)
Anion Gap: 13 mEq/L — ABNORMAL HIGH (ref 3–11)
BUN: 14.3 mg/dL (ref 7.0–26.0)
CO2: 28 mEq/L (ref 22–29)
CREATININE: 0.8 mg/dL (ref 0.6–1.1)
Calcium: 8.9 mg/dL (ref 8.4–10.4)
Chloride: 99 mEq/L (ref 98–109)
EGFR: 79 mL/min/{1.73_m2} — AB (ref >90)
GLUCOSE: 179 mg/dL — AB (ref 70–140)
Potassium: 4.1 mEq/L (ref 3.5–5.1)
Sodium: 140 mEq/L (ref 136–145)
Total Bilirubin: 0.26 mg/dL (ref 0.20–1.20)
Total Protein: 7.5 g/dL (ref 6.4–8.3)

## 2014-10-14 LAB — CBC WITH DIFFERENTIAL/PLATELET
BASO%: 0.7 % (ref 0.0–2.0)
BASOS ABS: 0.1 10*3/uL (ref 0.0–0.1)
EOS ABS: 0.2 10*3/uL (ref 0.0–0.5)
EOS%: 1.3 % (ref 0.0–7.0)
HCT: 38.9 % (ref 34.8–46.6)
HGB: 12.3 g/dL (ref 11.6–15.9)
LYMPH#: 1.1 10*3/uL (ref 0.9–3.3)
LYMPH%: 8.2 % — ABNORMAL LOW (ref 14.0–49.7)
MCH: 26.6 pg (ref 25.1–34.0)
MCHC: 31.6 g/dL (ref 31.5–36.0)
MCV: 84.3 fL (ref 79.5–101.0)
MONO#: 1 10*3/uL — ABNORMAL HIGH (ref 0.1–0.9)
MONO%: 7.2 % (ref 0.0–14.0)
NEUT%: 82.6 % — ABNORMAL HIGH (ref 38.4–76.8)
NEUTROS ABS: 10.9 10*3/uL — AB (ref 1.5–6.5)
Platelets: 252 10*3/uL (ref 145–400)
RBC: 4.61 10*6/uL (ref 3.70–5.45)
RDW: 18 % — AB (ref 11.2–14.5)
WBC: 13.2 10*3/uL — AB (ref 3.9–10.3)

## 2014-10-14 LAB — TSH CHCC: TSH: 6.652 m[IU]/L — AB (ref 0.308–3.960)

## 2014-10-14 NOTE — Telephone Encounter (Signed)
Gave avs & cal for Jan.
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
## 2014-10-15 ENCOUNTER — Encounter: Payer: Self-pay | Admitting: Radiation Oncology

## 2014-10-15 ENCOUNTER — Ambulatory Visit
Admission: RE | Admit: 2014-10-15 | Discharge: 2014-10-15 | Disposition: A | Discharge status: Home / Self Care | Payer: Medicare Other | Source: Ambulatory Visit | Attending: Radiation Oncology | Admitting: Radiation Oncology

## 2014-10-15 VITALS — BP 138/81 | HR 115 | Resp 16 | Ht 66.0 in | Wt 181.1 lb

## 2014-10-15 DIAGNOSIS — Z85118 Personal history of other malignant neoplasm of bronchus and lung: Secondary | ICD-10-CM | POA: Insufficient documentation

## 2014-10-15 DIAGNOSIS — Z923 Personal history of irradiation: Secondary | ICD-10-CM | POA: Insufficient documentation

## 2014-10-15 DIAGNOSIS — I871 Compression of vein: Secondary | ICD-10-CM

## 2014-10-15 DIAGNOSIS — Z9221 Personal history of antineoplastic chemotherapy: Secondary | ICD-10-CM | POA: Insufficient documentation

## 2014-10-15 DIAGNOSIS — C3411 Malignant neoplasm of upper lobe, right bronchus or lung: Secondary | ICD-10-CM | POA: Insufficient documentation

## 2014-10-15 NOTE — Progress Notes (Signed)
Thoracic Location of Tumor / Histology: Recurrent non-small cell lung cancer, squamous cell carcinoma  Patient presented to the emergency department on 10/09/2014 complaining of swelling of her face as well as SOB.    Tobacco/Marijuana/Snuff/ETOH use: current everyday smoker  Past/Anticipated interventions by cardiothoracic surgery, if any: no  Past/Anticipated interventions by medical oncology, if any: yes, PRIOR THERAPY:  #1 status post concurrent chemoradiation with weekly carboplatin and paclitaxel, last dose of chemotherapy given 08/05/2011.  #2 status post consolidation chemotherapy with carboplatin paclitaxel last dose given 10/27/2009.  #3 Concurrent chemoradiation with weekly carboplatin for an AUC of 2 and paclitaxel at 45 mg per meter squared given concurrent with radiotherapy, last dose was given 09/20/2011.  #4 Systemic chemotherapy with gemcitabine 1000 mg meter squared on days 1 and 8 every 3 weeks status post 3 cycles with stable disease, last dose was given 12/20/2011.  #5 Systemic chemotherapy again with single agent gemcitabine 1000 mg/M2 on days 1 and 8 every 3 weeks. First cycle 11/07/2013. Status post 4 cycles. #6 Systemic chemotherapy again with single agent gemcitabine 1000 mg/M2 on days 1 and 8 every 3 weeks. First dose 07/24/2014. Discontinued today secondary to intolerance.   CURRENT THERAPY: immunotherapy with Nivolumab 3 MG/KG every 2 weeks. First dose 09/18/2014. She is status post 2 cycles  Signs/Symptoms  Weight changes, if any: no  Respiratory complaints, if any: yes, SOB  Hemoptysis, if any: no  Pain issues, if any:  no  SAFETY ISSUES:  Prior radiation? yes, 2010 and 2012  Pacemaker/ICD? no   Possible current pregnancy?no  Is the patient on methotrexate? no  Current Complaints / other details:  58 year old female. Referred back by Dr. [name] for reconsult and evaluation of SVC.

## 2014-10-15 NOTE — Progress Notes (Signed)
See progress note for additional details.
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
## 2014-10-15 NOTE — Progress Notes (Signed)
Reports that she has a power port. Denies having a pacemaker. Hx of radiation therapy noted.

## 2014-10-16 ENCOUNTER — Ambulatory Visit: Payer: Medicare Other

## 2014-10-16 ENCOUNTER — Telehealth: Payer: Self-pay | Admitting: *Deleted

## 2014-10-16 ENCOUNTER — Other Ambulatory Visit: Payer: Self-pay | Admitting: Radiology

## 2014-10-16 NOTE — Telephone Encounter (Signed)
Called patient to inform of consult with Interventional Radiology for SVC Stent for tomorrow (10/17/14) @ [facility], patient is aware of this appt.

## 2014-10-17 ENCOUNTER — Ambulatory Visit (HOSPITAL_COMMUNITY)
Admission: RE | Admit: 2014-10-17 | Discharge: 2014-10-17 | Disposition: A | Discharge status: Home / Self Care | Payer: Medicare Other | Source: Ambulatory Visit | Attending: Radiation Oncology | Admitting: Radiation Oncology

## 2014-10-17 ENCOUNTER — Encounter (HOSPITAL_COMMUNITY): Payer: Self-pay

## 2014-10-17 ENCOUNTER — Other Ambulatory Visit: Payer: Self-pay | Admitting: Radiation Oncology

## 2014-10-17 DIAGNOSIS — E119 Type 2 diabetes mellitus without complications: Secondary | ICD-10-CM | POA: Insufficient documentation

## 2014-10-17 DIAGNOSIS — M7989 Other specified soft tissue disorders: Secondary | ICD-10-CM | POA: Diagnosis present

## 2014-10-17 DIAGNOSIS — I871 Compression of vein: Secondary | ICD-10-CM | POA: Diagnosis not present

## 2014-10-17 DIAGNOSIS — M13849 Other specified arthritis, unspecified hand: Secondary | ICD-10-CM | POA: Insufficient documentation

## 2014-10-17 DIAGNOSIS — Z79899 Other long term (current) drug therapy: Secondary | ICD-10-CM | POA: Insufficient documentation

## 2014-10-17 DIAGNOSIS — Z9221 Personal history of antineoplastic chemotherapy: Secondary | ICD-10-CM | POA: Diagnosis not present

## 2014-10-17 DIAGNOSIS — Z7952 Long term (current) use of systemic steroids: Secondary | ICD-10-CM | POA: Diagnosis not present

## 2014-10-17 DIAGNOSIS — C3491 Malignant neoplasm of unspecified part of right bronchus or lung: Secondary | ICD-10-CM | POA: Insufficient documentation

## 2014-10-17 DIAGNOSIS — F17210 Nicotine dependence, cigarettes, uncomplicated: Secondary | ICD-10-CM | POA: Insufficient documentation

## 2014-10-17 DIAGNOSIS — Z7982 Long term (current) use of aspirin: Secondary | ICD-10-CM | POA: Diagnosis not present

## 2014-10-17 DIAGNOSIS — J441 Chronic obstructive pulmonary disease with (acute) exacerbation: Secondary | ICD-10-CM | POA: Insufficient documentation

## 2014-10-17 DIAGNOSIS — Z791 Long term (current) use of non-steroidal anti-inflammatories (NSAID): Secondary | ICD-10-CM | POA: Diagnosis not present

## 2014-10-17 DIAGNOSIS — C3411 Malignant neoplasm of upper lobe, right bronchus or lung: Secondary | ICD-10-CM

## 2014-10-17 DIAGNOSIS — Z923 Personal history of irradiation: Secondary | ICD-10-CM | POA: Diagnosis not present

## 2014-10-17 DIAGNOSIS — E039 Hypothyroidism, unspecified: Secondary | ICD-10-CM | POA: Insufficient documentation

## 2014-10-17 DIAGNOSIS — Z7983 Long term (current) use of bisphosphonates: Secondary | ICD-10-CM | POA: Diagnosis not present

## 2014-10-17 DIAGNOSIS — E785 Hyperlipidemia, unspecified: Secondary | ICD-10-CM | POA: Diagnosis not present

## 2014-10-17 LAB — APTT: aPTT: 29 seconds (ref 24–37)

## 2014-10-17 LAB — CBC WITH DIFFERENTIAL/PLATELET
Basophils Absolute: 0.1 10*3/uL (ref 0.0–0.1)
Basophils Relative: 1 % (ref 0–1)
EOS ABS: 0.2 10*3/uL (ref 0.0–0.7)
Eosinophils Relative: 2 % (ref 0–5)
HCT: 38.3 % (ref 36.0–46.0)
HEMOGLOBIN: 12 g/dL (ref 12.0–15.0)
Lymphocytes Relative: 12 % (ref 12–46)
Lymphs Abs: 1.1 10*3/uL (ref 0.7–4.0)
MCH: 26.7 pg (ref 26.0–34.0)
MCHC: 31.3 g/dL (ref 30.0–36.0)
MCV: 85.3 fL (ref 78.0–100.0)
MONOS PCT: 7 % (ref 3–12)
Monocytes Absolute: 0.6 10*3/uL (ref 0.1–1.0)
Neutro Abs: 7 10*3/uL (ref 1.7–7.7)
Neutrophils Relative %: 78 % — ABNORMAL HIGH (ref 43–77)
Platelets: 212 10*3/uL (ref 150–400)
RBC: 4.49 MIL/uL (ref 3.87–5.11)
RDW: 16.5 % — ABNORMAL HIGH (ref 11.5–15.5)
WBC: 8.9 10*3/uL (ref 4.0–10.5)

## 2014-10-17 LAB — BASIC METABOLIC PANEL
Anion gap: 9 (ref 5–15)
BUN: 12 mg/dL (ref 6–23)
CHLORIDE: 103 meq/L (ref 96–112)
CO2: 25 mmol/L (ref 19–32)
Calcium: 8 mg/dL — ABNORMAL LOW (ref 8.4–10.5)
Creatinine, Ser: 0.52 mg/dL (ref 0.50–1.10)
GFR calc Af Amer: >90 mL/min (ref >90)
GFR calc non Af Amer: >90 mL/min (ref >90)
GLUCOSE: 200 mg/dL — AB (ref 70–99)
Potassium: 3.8 mmol/L (ref 3.5–5.1)
Sodium: 137 mmol/L (ref 135–145)

## 2014-10-17 LAB — GLUCOSE, CAPILLARY: GLUCOSE-CAPILLARY: 209 mg/dL — AB (ref 70–99)

## 2014-10-17 LAB — PROTIME-INR
INR: 1.11 (ref 0.00–1.49)
Prothrombin Time: 14.5 seconds (ref 11.6–15.2)

## 2014-10-17 MED ORDER — IOHEXOL 300 MG/ML  SOLN
80.0000 mL | Freq: Once | INTRAMUSCULAR | Status: AC | PRN
  Administered 2014-10-17: 20 mL via INTRAVENOUS

## 2014-10-17 MED ORDER — HEPARIN SOD (PORK) LOCK FLUSH 100 UNIT/ML IV SOLN
INTRAVENOUS | Status: AC
  Filled 2014-10-17: qty 5

## 2014-10-17 MED ORDER — FENTANYL CITRATE 0.05 MG/ML IJ SOLN
INTRAMUSCULAR | Status: AC | PRN
  Administered 2014-10-17 (×3): 50 ug via INTRAVENOUS

## 2014-10-17 MED ORDER — SODIUM CHLORIDE 0.9 % IV SOLN
INTRAVENOUS | Status: DC
  Administered 2014-10-17: 11:00:00 via INTRAVENOUS

## 2014-10-17 MED ORDER — VANCOMYCIN HCL IN DEXTROSE 1-5 GM/200ML-% IV SOLN
1000.0000 mg | Freq: Once | INTRAVENOUS | Status: AC
  Administered 2014-10-17: 1000 mg via INTRAVENOUS
  Filled 2014-10-17: qty 200

## 2014-10-17 MED ORDER — MIDAZOLAM HCL 2 MG/2ML IJ SOLN
INTRAMUSCULAR | Status: AC | PRN
  Administered 2014-10-17 (×2): 1 mg via INTRAVENOUS
  Administered 2014-10-17: 2 mg via INTRAVENOUS

## 2014-10-17 MED ORDER — FENTANYL CITRATE 0.05 MG/ML IJ SOLN
INTRAMUSCULAR | Status: AC
  Filled 2014-10-17: qty 4

## 2014-10-17 MED ORDER — HEPARIN SOD (PORK) LOCK FLUSH 100 UNIT/ML IV SOLN: 500.0000 [IU] | Freq: Once | INTRAVENOUS | Status: DC

## 2014-10-17 MED ORDER — LIDOCAINE HCL 1 % IJ SOLN
INTRAMUSCULAR | Status: AC
  Filled 2014-10-17: qty 20

## 2014-10-17 MED ORDER — MIDAZOLAM HCL 2 MG/2ML IJ SOLN
INTRAMUSCULAR | Status: AC
  Filled 2014-10-17: qty 4
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
## 2014-10-17 NOTE — Discharge Instructions (Signed)
Arteriogram Care After These instructions give you information on caring for yourself after your procedure. Your doctor may also give you more specific instructions. Call your doctor if you have any problems or questions after your procedure. HOME CARE  Keep your leg straight for at least 6 hours.  Do not bathe, swim, or use a hot tub until directed by your doctor. You can shower.  Do not lift anything heavier than 10 pounds (about a gallon of milk) for 2 days.  Do not walk a lot, run, or drive for 2 days.  Return to normal activities in 2 days or as told by your doctor. Finding out the results of your test Ask when your test results will be ready. Make sure you get your test results. GET HELP RIGHT AWAY IF:   You have fever.  You have more pain in your leg.  The leg that was cut is:  Bleeding.  Puffy (swollen) or red.  Cold.  Pale or changes color.  Weak.  Tingly or numb. If you go to the Emergency Room, tell your nurse that you have had an arteriogram. Take this paper with you to show the nurse. MAKE SURE YOU:  Understand these instructions.  Will watch your condition.  Will get help right away if you are not doing well or get worse. Document Released: 12/31/2008 Document Revised: 10/09/2013 Document Reviewed: 12/31/2008 [facility] Patient Information 2015 [place], [place]. This information is not intended to replace advice given to you by your health care provider. Make sure you discuss any questions you have with your health care provider.         Conscious Sedation, Adult, Care After Refer to this sheet in the next few weeks. These instructions provide you with information on caring for yourself after your procedure. Your health care provider may also give you more specific instructions. Your treatment has been planned according to current medical practices, but problems sometimes occur. Call your health care provider if you have any problems or questions after  your procedure. WHAT TO EXPECT AFTER THE PROCEDURE  After your procedure:  You may feel sleepy, clumsy, and have poor balance for several hours.  Vomiting may occur if you eat too soon after the procedure. HOME CARE INSTRUCTIONS  Do not participate in any activities where you could become injured for at least 24 hours. Do not:  Drive.  Swim.  Ride a bicycle.  Operate heavy machinery.  Cook.  Use power tools.  Climb ladders.  Work from a high place.  Do not make important decisions or sign legal documents until you are improved.  If you vomit, drink water, juice, or soup when you can drink without vomiting. Make sure you have little or no nausea before eating solid foods.  Only take over-the-counter or prescription medicines for pain, discomfort, or fever as directed by your health care provider.  Make sure you and your family fully understand everything about the medicines given to you, including what side effects may occur.  You should not drink alcohol, take sleeping pills, or take medicines that cause drowsiness for at least 24 hours.  If you smoke, do not smoke without supervision.  If you are feeling better, you may resume normal activities 24 hours after you were sedated.  Keep all appointments with your health care provider. SEEK MEDICAL CARE IF:  Your skin is pale or bluish in color.  You continue to feel nauseous or vomit.  Your pain is getting worse and is not  helped by medicine.  You have bleeding or swelling.  You are still sleepy or feeling clumsy after 24 hours. SEEK IMMEDIATE MEDICAL CARE IF:  You develop a rash.  You have difficulty breathing.  You develop any type of allergic problem.  You have a fever. MAKE SURE YOU:  Understand these instructions.  Will watch your condition.  Will get help right away if you are not doing well or get worse. Document Released: 07/25/2013 Document Reviewed: 07/25/2013 [facility] Patient Information  2015 [place], [place]. This information is not intended to replace advice given to you by your health care provider. Make sure you discuss any questions you have with your health care provider.

## 2014-10-17 NOTE — Procedures (Signed)
Interventional Radiology Procedure Note  Procedure: 1.) SVC and bilateral innominant venograms 2.) SVC stent (12x60 mm) 3.) Port revision Complications: None immediate Recommendations: - Bedrest x 2 hrs - Routine line care for port  Signed,  Criselda Peaches, MD

## 2014-10-22 ENCOUNTER — Telehealth: Payer: Self-pay | Admitting: Emergency Medicine

## 2014-10-22 NOTE — Telephone Encounter (Incomplete)
lmovm to get at status update on pt condition post SVC stenting.

## 2014-10-24 ENCOUNTER — Telehealth: Payer: Self-pay

## 2014-10-24 ENCOUNTER — Other Ambulatory Visit: Payer: Self-pay | Admitting: Physician Assistant

## 2014-10-24 NOTE — Telephone Encounter (Signed)
Pt called, spoke with her PCP office, requested lab results be faxed to Dr. Toy Cookey fax: [phone]. Faxed lab results from 10/14/14. Confirmation received.

## 2014-10-24 NOTE — Telephone Encounter (Signed)
-----   Message from Carlton Adam, PA-C sent at 10/24/2014  9:27 AM EST ----- Abnormal results, please call  and notify patient to follow up with her primary care physician regarding her elevated TSH of 6.652, previously 5.639

## 2014-10-24 NOTE — Telephone Encounter (Signed)
Called to inform pt she had an elevated TSH on 10/14/14, informed her of the last 2 levels and to follow up with her PCP. Informed pt if her PCP needed [place] to fax results to call our office. Pt verbalized all understanding and denies any questions or concerns.

## 2014-10-30 ENCOUNTER — Ambulatory Visit: Payer: Medicare Other

## 2014-11-03 ENCOUNTER — Encounter (HOSPITAL_COMMUNITY): Payer: Self-pay | Admitting: *Deleted

## 2014-11-03 ENCOUNTER — Emergency Department (HOSPITAL_COMMUNITY): Payer: Medicare Other

## 2014-11-03 ENCOUNTER — Observation Stay (HOSPITAL_COMMUNITY)
Admission: EM | Admit: 2014-11-03 | Discharge: 2014-11-05 | Disposition: A | Discharge status: Home / Self Care | Payer: Medicare Other | Attending: Internal Medicine | Admitting: Internal Medicine

## 2014-11-03 DIAGNOSIS — Z882 Allergy status to sulfonamides status: Secondary | ICD-10-CM | POA: Insufficient documentation

## 2014-11-03 DIAGNOSIS — E46 Unspecified protein-calorie malnutrition: Secondary | ICD-10-CM | POA: Diagnosis not present

## 2014-11-03 DIAGNOSIS — Z885 Allergy status to narcotic agent status: Secondary | ICD-10-CM | POA: Diagnosis not present

## 2014-11-03 DIAGNOSIS — F419 Anxiety disorder, unspecified: Secondary | ICD-10-CM | POA: Diagnosis not present

## 2014-11-03 DIAGNOSIS — D630 Anemia in neoplastic disease: Secondary | ICD-10-CM | POA: Insufficient documentation

## 2014-11-03 DIAGNOSIS — Z7982 Long term (current) use of aspirin: Secondary | ICD-10-CM | POA: Diagnosis not present

## 2014-11-03 DIAGNOSIS — I871 Compression of vein: Secondary | ICD-10-CM | POA: Diagnosis not present

## 2014-11-03 DIAGNOSIS — Z880 Allergy status to penicillin: Secondary | ICD-10-CM | POA: Insufficient documentation

## 2014-11-03 DIAGNOSIS — E039 Hypothyroidism, unspecified: Secondary | ICD-10-CM | POA: Diagnosis present

## 2014-11-03 DIAGNOSIS — Z9221 Personal history of antineoplastic chemotherapy: Secondary | ICD-10-CM | POA: Insufficient documentation

## 2014-11-03 DIAGNOSIS — C3400 Malignant neoplasm of unspecified main bronchus: Secondary | ICD-10-CM

## 2014-11-03 DIAGNOSIS — E119 Type 2 diabetes mellitus without complications: Secondary | ICD-10-CM | POA: Diagnosis not present

## 2014-11-03 DIAGNOSIS — Z923 Personal history of irradiation: Secondary | ICD-10-CM | POA: Diagnosis not present

## 2014-11-03 DIAGNOSIS — J449 Chronic obstructive pulmonary disease, unspecified: Secondary | ICD-10-CM | POA: Diagnosis present

## 2014-11-03 DIAGNOSIS — R000 Tachycardia, unspecified: Secondary | ICD-10-CM | POA: Diagnosis not present

## 2014-11-03 DIAGNOSIS — E785 Hyperlipidemia, unspecified: Secondary | ICD-10-CM | POA: Insufficient documentation

## 2014-11-03 DIAGNOSIS — C3411 Malignant neoplasm of upper lobe, right bronchus or lung: Principal | ICD-10-CM | POA: Diagnosis present

## 2014-11-03 DIAGNOSIS — R079 Chest pain, unspecified: Secondary | ICD-10-CM | POA: Diagnosis present

## 2014-11-03 DIAGNOSIS — R0600 Dyspnea, unspecified: Secondary | ICD-10-CM

## 2014-11-03 DIAGNOSIS — F17210 Nicotine dependence, cigarettes, uncomplicated: Secondary | ICD-10-CM | POA: Diagnosis not present

## 2014-11-03 LAB — I-STAT TROPONIN, ED: Troponin i, poc: 0 ng/mL (ref 0.00–0.08)

## 2014-11-03 LAB — CBC
HEMATOCRIT: 37.4 % (ref 36.0–46.0)
Hemoglobin: 12.1 g/dL (ref 12.0–15.0)
MCH: 27.1 pg (ref 26.0–34.0)
MCHC: 32.4 g/dL (ref 30.0–36.0)
MCV: 83.9 fL (ref 78.0–100.0)
Platelets: 192 10*3/uL (ref 150–400)
RBC: 4.46 MIL/uL (ref 3.87–5.11)
RDW: 15.5 % (ref 11.5–15.5)
WBC: 7.2 10*3/uL (ref 4.0–10.5)

## 2014-11-03 LAB — TROPONIN I
Troponin I: <0.03 ng/mL (ref <0.031)
Troponin I: <0.03 ng/mL (ref <0.031)

## 2014-11-03 LAB — BASIC METABOLIC PANEL
ANION GAP: 14 (ref 5–15)
BUN: 8 mg/dL (ref 6–23)
CO2: 22 mmol/L (ref 19–32)
Calcium: 8.3 mg/dL — ABNORMAL LOW (ref 8.4–10.5)
Chloride: 99 mEq/L (ref 96–112)
Creatinine, Ser: 0.71 mg/dL (ref 0.50–1.10)
GFR calc Af Amer: >90 mL/min (ref >90)
Glucose, Bld: 188 mg/dL — ABNORMAL HIGH (ref 70–99)
POTASSIUM: 3.8 mmol/L (ref 3.5–5.1)
Sodium: 135 mmol/L (ref 135–145)

## 2014-11-03 LAB — GLUCOSE, CAPILLARY
GLUCOSE-CAPILLARY: 186 mg/dL — AB (ref 70–99)
Glucose-Capillary: 83 mg/dL (ref 70–99)

## 2014-11-03 LAB — TSH: TSH: 11.19 u[IU]/mL — AB (ref 0.350–4.500)

## 2014-11-03 LAB — T4, FREE: Free T4: 0.95 ng/dL (ref 0.80–1.80)

## 2014-11-03 LAB — BRAIN NATRIURETIC PEPTIDE: B NATRIURETIC PEPTIDE 5: 22.1 pg/mL (ref 0.0–100.0)

## 2014-11-03 MED ORDER — ACETAMINOPHEN 325 MG PO TABS: 650.0000 mg | ORAL_TABLET | Freq: Four times a day (QID) | ORAL | Status: DC | PRN

## 2014-11-03 MED ORDER — SPIRONOLACTONE 25 MG PO TABS
25.0000 mg | ORAL_TABLET | Freq: Every morning | ORAL | Status: DC
  Administered 2014-11-04 – 2014-11-05 (×2): 25 mg via ORAL
  Filled 2014-11-03 (×2): qty 1

## 2014-11-03 MED ORDER — ALBUTEROL SULFATE (2.5 MG/3ML) 0.083% IN NEBU: 2.5000 mg | INHALATION_SOLUTION | RESPIRATORY_TRACT | Status: DC | PRN

## 2014-11-03 MED ORDER — PROCHLORPERAZINE MALEATE 10 MG PO TABS: 10.0000 mg | ORAL_TABLET | Freq: Four times a day (QID) | ORAL | Status: DC | PRN

## 2014-11-03 MED ORDER — GUAIFENESIN ER 600 MG PO TB12
1200.0000 mg | ORAL_TABLET | Freq: Two times a day (BID) | ORAL | Status: DC
  Administered 2014-11-03 – 2014-11-05 (×4): 1200 mg via ORAL
  Filled 2014-11-03 (×4): qty 2

## 2014-11-03 MED ORDER — ONDANSETRON HCL 4 MG PO TABS: 4.0000 mg | ORAL_TABLET | Freq: Four times a day (QID) | ORAL | Status: DC | PRN

## 2014-11-03 MED ORDER — IOHEXOL 350 MG/ML SOLN
100.0000 mL | Freq: Once | INTRAVENOUS | Status: AC | PRN
  Administered 2014-11-03: 100 mL via INTRAVENOUS

## 2014-11-03 MED ORDER — SODIUM CHLORIDE 0.9 % IV SOLN
INTRAVENOUS | Status: AC
  Administered 2014-11-03: 18:00:00 via INTRAVENOUS

## 2014-11-03 MED ORDER — IPRATROPIUM BROMIDE 0.02 % IN SOLN
0.5000 mg | Freq: Four times a day (QID) | RESPIRATORY_TRACT | Status: DC
  Administered 2014-11-03: 0.5 mg via RESPIRATORY_TRACT
  Filled 2014-11-03: qty 2.5

## 2014-11-03 MED ORDER — INSULIN ASPART 100 UNIT/ML ~~LOC~~ SOLN
0.0000 [IU] | Freq: Three times a day (TID) | SUBCUTANEOUS | Status: DC
  Administered 2014-11-04: 2 [IU] via SUBCUTANEOUS
  Administered 2014-11-04 – 2014-11-05 (×3): 3 [IU] via SUBCUTANEOUS

## 2014-11-03 MED ORDER — IPRATROPIUM-ALBUTEROL 0.5-2.5 (3) MG/3ML IN SOLN
3.0000 mL | Freq: Four times a day (QID) | RESPIRATORY_TRACT | Status: DC
  Administered 2014-11-04 – 2014-11-05 (×4): 3 mL via RESPIRATORY_TRACT
  Filled 2014-11-03 (×6): qty 3

## 2014-11-03 MED ORDER — ALBUTEROL SULFATE (2.5 MG/3ML) 0.083% IN NEBU
2.5000 mg | INHALATION_SOLUTION | Freq: Four times a day (QID) | RESPIRATORY_TRACT | Status: DC
  Administered 2014-11-03: 2.5 mg via RESPIRATORY_TRACT
  Filled 2014-11-03: qty 3

## 2014-11-03 MED ORDER — LEVOTHYROXINE SODIUM 125 MCG PO TABS
125.0000 ug | ORAL_TABLET | Freq: Every day | ORAL | Status: DC
  Administered 2014-11-04 – 2014-11-05 (×2): 125 ug via ORAL
  Filled 2014-11-03 (×2): qty 1

## 2014-11-03 MED ORDER — ACETAMINOPHEN 650 MG RE SUPP: 650.0000 mg | Freq: Four times a day (QID) | RECTAL | Status: DC | PRN

## 2014-11-03 MED ORDER — FENTANYL CITRATE 0.05 MG/ML IJ SOLN
50.0000 ug | Freq: Once | INTRAMUSCULAR | Status: AC
  Administered 2014-11-03: 50 ug via INTRAVENOUS
  Filled 2014-11-03: qty 2

## 2014-11-03 MED ORDER — SERTRALINE HCL 100 MG PO TABS
150.0000 mg | ORAL_TABLET | Freq: Every day | ORAL | Status: DC
  Administered 2014-11-03 – 2014-11-04 (×2): 150 mg via ORAL
  Filled 2014-11-03 (×2): qty 1

## 2014-11-03 MED ORDER — NITROGLYCERIN 0.4 MG SL SUBL
0.4000 mg | SUBLINGUAL_TABLET | SUBLINGUAL | Status: DC | PRN
  Administered 2014-11-03: 0.4 mg via SUBLINGUAL
  Filled 2014-11-03: qty 1

## 2014-11-03 MED ORDER — LORAZEPAM 1 MG PO TABS
1.0000 mg | ORAL_TABLET | Freq: Three times a day (TID) | ORAL | Status: DC | PRN
Start: 2014-11-03 — End: 2014-11-05
  Administered 2014-11-04 (×2): 1 mg via ORAL
  Filled 2014-11-03 (×2): qty 1

## 2014-11-03 MED ORDER — ASPIRIN 325 MG PO TABS
325.0000 mg | ORAL_TABLET | Freq: Every day | ORAL | Status: DC
  Administered 2014-11-03 – 2014-11-05 (×3): 325 mg via ORAL
  Filled 2014-11-03 (×3): qty 1

## 2014-11-03 MED ORDER — ENOXAPARIN SODIUM 40 MG/0.4ML ~~LOC~~ SOLN
40.0000 mg | SUBCUTANEOUS | Status: DC
  Administered 2014-11-03 – 2014-11-04 (×2): 40 mg via SUBCUTANEOUS
  Filled 2014-11-03 (×3): qty 0.4

## 2014-11-03 MED ORDER — ASPIRIN 81 MG PO CHEW
324.0000 mg | CHEWABLE_TABLET | Freq: Once | ORAL | Status: AC
  Administered 2014-11-03: 324 mg via ORAL
  Filled 2014-11-03: qty 4

## 2014-11-03 MED ORDER — VARENICLINE TARTRATE 1 MG PO TABS
1.0000 mg | ORAL_TABLET | Freq: Two times a day (BID) | ORAL | Status: DC
  Administered 2014-11-03 – 2014-11-05 (×4): 1 mg via ORAL
  Filled 2014-11-03 (×5): qty 1

## 2014-11-03 MED ORDER — BUDESONIDE-FORMOTEROL FUMARATE 160-4.5 MCG/ACT IN AERO
2.0000 | INHALATION_SPRAY | Freq: Two times a day (BID) | RESPIRATORY_TRACT | Status: DC
  Administered 2014-11-03 – 2014-11-05 (×4): 2 via RESPIRATORY_TRACT
  Filled 2014-11-03: qty 6

## 2014-11-03 MED ORDER — SODIUM CHLORIDE 0.9 % IJ SOLN
3.0000 mL | Freq: Two times a day (BID) | INTRAMUSCULAR | Status: DC
  Administered 2014-11-04 (×2): 3 mL via INTRAVENOUS

## 2014-11-03 MED ORDER — MORPHINE SULFATE 2 MG/ML IJ SOLN
2.0000 mg | INTRAMUSCULAR | Status: DC | PRN
  Administered 2014-11-03 – 2014-11-04 (×3): 2 mg via INTRAVENOUS
  Filled 2014-11-03 (×3): qty 1

## 2014-11-03 MED ORDER — DOCUSATE SODIUM 100 MG PO CAPS
100.0000 mg | ORAL_CAPSULE | Freq: Two times a day (BID) | ORAL | Status: DC
  Administered 2014-11-03 – 2014-11-05 (×4): 100 mg via ORAL
  Filled 2014-11-03 (×4): qty 1

## 2014-11-03 MED ORDER — ONDANSETRON HCL 4 MG/2ML IJ SOLN
4.0000 mg | Freq: Four times a day (QID) | INTRAMUSCULAR | Status: DC | PRN
  Administered 2014-11-05: 4 mg via INTRAVENOUS
  Filled 2014-11-03: qty 2

## 2014-11-03 MED ORDER — OXYCODONE HCL 5 MG PO TABS
5.0000 mg | ORAL_TABLET | ORAL | Status: DC | PRN
Start: 2014-11-03 — End: 2014-11-05
  Administered 2014-11-04 – 2014-11-05 (×3): 5 mg via ORAL
  Filled 2014-11-03 (×3): qty 1

## 2014-11-03 MED ORDER — PANTOPRAZOLE SODIUM 40 MG PO TBEC
40.0000 mg | DELAYED_RELEASE_TABLET | Freq: Every day | ORAL | Status: DC
  Administered 2014-11-03 – 2014-11-04 (×2): 40 mg via ORAL
  Filled 2014-11-03 (×3): qty 1

## 2014-11-03 MED ORDER — ADULT MULTIVITAMIN W/MINERALS CH
1.0000 | ORAL_TABLET | Freq: Every morning | ORAL | Status: DC
  Administered 2014-11-04 – 2014-11-05 (×2): 1 via ORAL
  Filled 2014-11-03 (×2): qty 1

## 2014-11-03 NOTE — ED Notes (Addendum)
Pt reports chest pain and sob intermittent over last few days. But consistent since last night. Sob worse with exertion. Had ?pulmonary artery stent placed 2 weeks ago. Sts "I'm not sure if the stent is even working, it feels the same as it did before the stent. 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 Denies n/v, dizziness.

## 2014-11-03 NOTE — ED Provider Notes (Signed)
TIME SEEN:  12:20 PM  CHIEF COMPLAINT: Chest pain, shortness of breath   HPI: Pt is a 59 y.o. 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 Chest pain is worse with lying flat. Shortness of breath is worse with exertion. No pleuritic chest pain. No fevers or cough. No wheezing. No lower external swelling or pain. No prior history of PE or DVT. No prior history of stress test or cardiac catheterization. She still smokes tobacco products occasionally. Denies associated nausea, diaphoresis, dizziness.   ROS: See HPI Constitutional: no fever  Eyes: no drainage  ENT: no runny nose   Cardiovascular:   chest pain  Resp:  SOB  GI: no vomiting GU: no dysuria Integumentary: no rash  Allergy: no hives  Musculoskeletal: no leg swelling  Neurological: no slurred speech ROS otherwise negative  PAST MEDICAL HISTORY/PAST SURGICAL HISTORY:  Past Medical History  Diagnosis Date  . Anxiety   . Hyperlipidemia   . Hypothyroidism   . COPD (chronic obstructive pulmonary disease)   . Arthritis     thumbs  . 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 Diabetes mellitus   . Lung cancer dx'd 05/2009  . Lung cancer dx'd 09/2013    recurrence in LN  . 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 Authorizing Provider  acetaminophen (TYLENOL) 325 MG tablet Take 650 mg by mouth every 6 (six) hours  as needed for mild pain or headache.     Historical Provider, MD  alendronate (FOSAMAX) 10 MG tablet Take 10 mg by mouth daily.  03/20/14   Historical Provider, MD  aspirin 325 MG tablet Take 325 mg by mouth daily with breakfast.     Historical Provider, MD  cholecalciferol (VITAMIN D) 1000 UNITS tablet Take 1,000 Units by mouth every morning.     Historical Provider, MD  cyanocobalamin 1000 MCG tablet Take 1,000 mcg by mouth every morning.     Historical Provider, MD  glimepiride (AMARYL) 2 MG tablet Take 2 mg by mouth daily with breakfast.  12/21/13   Historical Provider, MD  guaiFENesin (MUCINEX) 600 MG 12 hr tablet Take 1,200 mg by mouth 2 (two) times daily.    Historical Provider, MD  ibuprofen (ADVIL,MOTRIN) 200 MG tablet Take 400 mg by mouth every 6 (six) hours as needed for headache or moderate pain.    Historical Provider, MD  ipratropium (ATROVENT) 0.02 % nebulizer solution Take 2.5 mLs (0.5 mg total) by nebulization 4 (four) times daily. 03/05/14   Brand Males, MD  levofloxacin (LEVAQUIN) 500 MG tablet Take 500 mg by mouth daily. Takes for 10 days 10/06/14   Historical Provider, MD  levothyroxine (SYNTHROID, LEVOTHROID) 125 MCG tablet Take 125 mcg by mouth daily before breakfast.     Historical Provider, MD  LORazepam (ATIVAN) 1 MG tablet Take 1 mg by mouth 3 (three) times daily as needed for anxiety.     Historical Provider, MD  magnesium gluconate (MAGONATE) 500  MG tablet Take 500 mg by mouth every morning.     Historical Provider, MD  metFORMIN (GLUCOPHAGE) 1000 MG tablet Take 1,000 mg by mouth 2 (two) times daily with a meal.    Historical Provider, MD  Multiple Vitamin (MULTIVITAMIN WITH MINERALS) TABS tablet Take 1 tablet by mouth every morning.    Historical Provider, MD  OVER THE COUNTER MEDICATION Take 1 capsule by mouth daily. MSM. For energy and joint pain    Historical Provider, MD  oxyCODONE-acetaminophen (PERCOCET/ROXICET) 5-325 MG per tablet Take 1 tablet by mouth every 6  (six) hours as needed for severe pain. 10/02/14   Carlton Adam, PA-C  [place]    Historical Provider, MD  PROAIR HFA 108 (90 BASE) MCG/ACT inhaler INHALE 2 PUFFS INTO THE LUNGS EVERY 6 (SIX) HOURS AS NEEDED FOR WHEEZING OR SHORTNESS OF BREATH. 10/08/14   Brand Males, MD  prochlorperazine (COMPAZINE) 10 MG tablet TAKE 1 TABLET (10 MG TOTAL) BY MOUTH EVERY 6 (SIX) HOURS AS NEEDED FOR NAUSEA OR VOMITING. 10/03/14   Curt Bears, MD  sertraline (ZOLOFT) 100 MG tablet Take 150 mg by mouth at bedtime.     Historical Provider, MD  spironolactone (ALDACTONE) 25 MG tablet Take 25 mg by mouth every morning.     Historical Provider, MD  SYMBICORT 160-4.5 MCG/ACT inhaler INHALE 2 PUFFS INTO THE LUNGS 2 (TWO) TIMES DAILY. 10/08/14   Brand Males, MD  varenicline (CHANTIX) 1 MG tablet Take 1 mg by mouth 2 (two) times daily.    Historical Provider, MD    ALLERGIES:  Allergies  Allergen Reactions  . Sulfonamide Derivatives Rash  . Codeine Other (See Comments)    hallucinations  . Dilaudid [Hydromorphone Hcl] Nausea And Vomiting and Other (See Comments)    Room started spinning.  Pt has been okay with low doses and nausea meds administered first  . Penicillins Other (See Comments)    Childhood allergy; reaction unknown  . Vicodin [Hydrocodone-Acetaminophen] Rash    SOCIAL HISTORY:  History  Substance Use Topics  . Smoking status: Current Some Day Smoker -- 0.50 packs/day for 45 years    Types: Cigarettes  . Smokeless tobacco: Never Used     Comment: pt. started back smoking 10/18/2012, half a pack a day  . Alcohol Use: No    FAMILY HISTORY: Family History  Problem Relation Age of Onset  . Colon cancer Mother   . Cancer Mother     colon  . Diabetes type II Father     EXAM: BP 131/76 mmHg  Pulse 115  Temp(Src) 97.8 F (36.6 C) (Oral)  Resp 16  SpO2 95% CONSTITUTIONAL: Alert and oriented and responds appropriately to questions. Well-appearing;  well-nourished HEAD: Normocephalic EYES: Conjunctivae clear, PERRL ENT: normal nose; no rhinorrhea; moist mucous membranes; pharynx without lesions noted NECK: Supple, no meningismus, no LAD  CARD: Regular rate and rhythm; S1 and S2 appreciated; no murmurs, no clicks, no rubs, no gallops RESP: Normal chest excursion without splinting or tachypnea; breath sounds clear and equal bilaterally; no wheezes, no rhonchi, no rales, no hypoxia, chest wall is nontender to palpation ABD/GI: Normal bowel sounds; non-distended; soft, non-tender, no rebound, no guarding BACK:  The back appears normal and is non-tender to palpation, there is no CVA tenderness EXT: Normal ROM in all joints; non-tender to palpation; no edema; normal capillary refill; no cyanosis, no signs of swelling of the face are upper extremities, 2+ DP and radial pulses bilaterally, no calf tenderness  or swelling    SKIN: Normal color for age and race; warm NEURO: Moves all extremities equally PSYCH: The patient's mood and manner are appropriate. Grooming and personal hygiene are appropriate.  MEDICAL DECISION MAKING: Patient here with chest pain and shortness of breath. She has multiple risk factors for ACS as well as pulmonary embolus. No signs of SVC syndrome based on exam currently. We'll obtain labs, CT of her chest. We'll give aspirin and nitroglycerin. I feel patient will need admission.  ED PROGRESS: CT scan shows worsening right upper lobe mass, multiple new small enlarging nodules in both lungs suspicious for early metastases and progressive right hilar metastatic adenopathy. No emboli, infiltrate or edema. Troponin negative. Chest pain did not improve with nitroglycerin. We'll give morphine. Discussed with patient at length that I feel her pain may be more likely secondary to her worsening lung cancer but there is always a possibility this could be cardiac in nature given her risk factors. Have offered admission versus second troponin  and discharge with close outpatient follow-up. She reports she would prefer admission. Spoke with Dr. [place] Pink with hospitalist service who agrees for observation, telemetry admission.     EKG Interpretation  Date/Time:  Sunday November 03 2014 11:48:48 EST Ventricular Rate:  115 PR Interval:  155 QRS Duration: 80 QT Interval:  338 QTC Calculation: 467 R Axis:   128 Text Interpretation:  Sinus tachycardia Left posterior fascicular block Baseline wander in lead(s) V2 No significant change since last tracing Confirmed by WARD,  DO, KRISTEN [phone]) on 11/03/2014 12:04:57 PM          [place], DO 11/03/14 1457

## 2014-11-03 NOTE — H&P (Signed)
[facility] History and Physical  [name] GEZ:[id number] DOB: [birth year] DOA: 11/03/2014   PCP: [name], NP  Specialists: Dr. [name] is her oncologist. Dr. Tammi Klippel is her radiation oncologist. Dr. Chase Caller is her pulmonologist.  Chief Complaint: Chest pain since yesterday  HPI: Janice Ball is a 59 y.o. female with a past medical history of lung cancer, SVC syndrome, status post SVC stent placement on 10/17/2014, diabetes mellitus type 2, COPD, hypothyroidism, who was in her usual state of health till yesterday when she started noticing pain in the central part of her chest towards the left side. There was no radiation. The pain would come and go. At its worst it was 5 out of 10 in intensity. She had a cough which is chronic for her. No expectoration. No bloody sputum. She's had chills but no fever. No dizziness, but has been nauseated. Denies any vomiting. When she takes a deep breath she feels a strange sensation in her chest. She is unable to describe this. Denies being on oxygen at home. Denies any history of heart disease. No history of stress test.  Home Medications: Prior to Admission medications   Medication Sig Start Date End Date Taking? Authorizing Provider  acetaminophen (TYLENOL) 325 MG tablet Take 650 mg by mouth every 6 (six) hours as needed for mild pain or headache.    Yes Historical Provider, MD  albuterol (PROVENTIL HFA;VENTOLIN HFA) 108 (90 BASE) MCG/ACT inhaler Inhale 1 puff into the lungs every 6 (six) hours as needed for wheezing or shortness of breath.   Yes Historical Provider, MD  alendronate (FOSAMAX) 10 MG tablet Take 10 mg by mouth daily.  03/20/14  Yes Historical Provider, MD  aspirin 325 MG tablet Take 325 mg by mouth daily with breakfast.    Yes Historical Provider, MD  budesonide-formoterol (SYMBICORT) 160-4.5 MCG/ACT inhaler Inhale 2 puffs into the lungs 2 (two) times daily.   Yes Historical Provider, MD  cholecalciferol (VITAMIN D) 1000  UNITS tablet Take 1,000 Units by mouth every morning.    Yes Historical Provider, MD  cyanocobalamin 1000 MCG tablet Take 1,000 mcg by mouth every morning.    Yes Historical Provider, MD  glimepiride (AMARYL) 2 MG tablet Take 2 mg by mouth daily with breakfast.  12/21/13  Yes Historical Provider, MD  guaiFENesin (MUCINEX) 600 MG 12 hr tablet Take 1,200 mg by mouth 2 (two) times daily.   Yes Historical Provider, MD  ibuprofen (ADVIL,MOTRIN) 200 MG tablet Take 400 mg by mouth every 6 (six) hours as needed for headache or moderate pain.   Yes Historical Provider, MD  ipratropium (ATROVENT) 0.02 % nebulizer solution Take 2.5 mLs (0.5 mg total) by nebulization 4 (four) times daily. 03/05/14  Yes Brand Males, MD  levothyroxine (SYNTHROID, LEVOTHROID) 125 MCG tablet Take 125 mcg by mouth daily before breakfast.    Yes Historical Provider, MD  LORazepam (ATIVAN) 1 MG tablet Take 1 mg by mouth 3 (three) times daily as needed for anxiety.    Yes Historical Provider, MD  magnesium gluconate (MAGONATE) 500 MG tablet Take 500 mg by mouth every morning.    Yes Historical Provider, MD  metFORMIN (GLUCOPHAGE) 1000 MG tablet Take 1,000 mg by mouth 2 (two) times daily with a meal.   Yes Historical Provider, MD  Multiple Vitamin (MULTIVITAMIN WITH MINERALS) TABS tablet Take 1 tablet by mouth every morning.   Yes Historical Provider, MD  OVER THE COUNTER MEDICATION Take 1 capsule by mouth daily. MSM. For energy and joint  pain   Yes Historical Provider, MD  oxyCODONE-acetaminophen (PERCOCET/ROXICET) 5-325 MG per tablet Take 1 tablet by mouth every 6 (six) hours as needed for severe pain. 10/02/14  Yes Carlton Adam, PA-C  prochlorperazine (COMPAZINE) 10 MG tablet Take 10 mg by mouth every 6 (six) hours as needed for nausea or vomiting.   Yes Historical Provider, MD  sertraline (ZOLOFT) 100 MG tablet Take 150 mg by mouth at bedtime.    Yes Historical Provider, MD  spironolactone (ALDACTONE) 25 MG tablet Take 25 mg by  mouth every morning.    Yes Historical Provider, MD  varenicline (CHANTIX) 1 MG tablet Take 1 mg by mouth 2 (two) times daily.   Yes Historical Provider, MD  [place]    Historical Provider, MD  PROAIR HFA 108 (90 BASE) MCG/ACT inhaler INHALE 2 PUFFS INTO THE LUNGS EVERY 6 (SIX) HOURS AS NEEDED FOR WHEEZING OR SHORTNESS OF BREATH. Patient not taking: Reported on 11/03/2014 10/08/14   Brand Males, MD  prochlorperazine (COMPAZINE) 10 MG tablet TAKE 1 TABLET (10 MG TOTAL) BY MOUTH EVERY 6 (SIX) HOURS AS NEEDED FOR NAUSEA OR VOMITING. Patient not taking: Reported on 11/03/2014 10/03/14   Curt Bears, MD  SYMBICORT 160-4.5 MCG/ACT inhaler INHALE 2 PUFFS INTO THE LUNGS 2 (TWO) TIMES DAILY. Patient not taking: Reported on 11/03/2014 10/08/14   Brand Males, MD    Allergies:  Allergies  Allergen Reactions  . Sulfonamide Derivatives Rash  . Codeine Other (See Comments)    hallucinations  . Dilaudid [Hydromorphone Hcl] Nausea And Vomiting and Other (See Comments)    Room started spinning.  Pt has been okay with low doses and nausea meds administered first  . Penicillins Other (See Comments)    Childhood allergy; reaction unknown  . Vicodin [Hydrocodone-Acetaminophen] Rash    Past Medical History: Past Medical History  Diagnosis Date  . Anxiety   . Hyperlipidemia   . Hypothyroidism   . COPD (chronic obstructive pulmonary disease)   . Arthritis     thumbs  . 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 Diabetes mellitus   . Lung cancer dx'd 05/2009  . Lung cancer dx'd 09/2013    recurrence in LN  . 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 Thyroidectomy, partial    . Tonsillectomy    . Tubal ligation    . Partial hysterectomy      Social History: She lives in [place] with her daughter. Smokes occasionally. No alcohol use. Independent  with daily activities.  Family History:  Family History  Problem Relation Age of Onset  . Colon cancer Mother   . Cancer Mother     colon  . Diabetes type II Father      Review of Systems - History obtained from the patient General ROS: positive for  - chills Psychological ROS: positive for - anxiety Ophthalmic ROS: negative ENT ROS: negative Allergy and Immunology ROS: negative Hematological and Lymphatic ROS: negative Endocrine ROS: negative Respiratory ROS: as in hpi Cardiovascular ROS: as in hpi Gastrointestinal ROS: no abdominal pain, change in bowel habits, or black or bloody stools Genito-Urinary ROS: no dysuria, trouble voiding, or hematuria Musculoskeletal ROS: negative Neurological ROS: no TIA or stroke symptoms Dermatological ROS: negative  Physical Examination  Filed Vitals:   11/03/14 1200 11/03/14 1230 11/03/14 1300 11/03/14 1500  BP: 150/107 154/77 148/75 126/97  Pulse: 113 113 109 110  Temp:      TempSrc:      Resp: 12 18 22 22   SpO2: 93% 92% 89% 93%    BP 126/97 mmHg  Pulse 110  Temp(Src) 97.8 F (36.6 C) (Oral)  Resp 22  SpO2 93%  General appearance: alert, cooperative, appears stated age, no distress and moderately obese Head: Normocephalic, without obvious abnormality, atraumatic Eyes: conjunctivae/corneas clear. PERRL, EOM's intact.  Throat: lips, mucosa, and tongue normal; teeth and gums normal Neck: no adenopathy, no carotid bruit, no JVD, supple, symmetrical, trachea midline and thyroid not enlarged, symmetric, no tenderness/mass/nodules Resp: Scattered wheezing bilaterally. No crackles. No rhonchi. Chest wall: no tenderness Cardio: S1, S2 is tachycardia, regular. No S3, S4. No rubs, or bruit. No pedal edema. GI: soft, non-tender; bowel sounds normal; no masses,  no organomegaly Extremities: extremities normal, atraumatic, no cyanosis or edema Pulses: 2+ and symmetric Skin: Skin color, texture, turgor normal. No rashes or lesions Lymph  nodes: Enlarged cervical lymph nodes on the left. Neurologic: Alert and oriented 3. No focal neurological deficits are present.  Laboratory Data: Results for orders placed or performed during the hospital encounter of 11/03/14 (from the past 48 hour(s))  CBC     Status: None   Collection Time: 11/03/14 12:22 PM  Result Value Ref Range   WBC 7.2 4.0 - 10.5 K/uL   RBC 4.46 3.87 - 5.11 MIL/uL   Hemoglobin 12.1 12.0 - 15.0 g/dL   HCT 37.4 36.0 - 46.0 %   MCV 83.9 78.0 - 100.0 fL   MCH 27.1 26.0 - 34.0 pg   MCHC 32.4 30.0 - 36.0 g/dL   RDW 15.5 11.5 - 15.5 %   Platelets 192 150 - 400 K/uL  Basic metabolic panel     Status: Abnormal   Collection Time: 11/03/14 12:22 PM  Result Value Ref Range   Sodium 135 135 - 145 mmol/L    Comment: Please note change in reference range.   Potassium 3.8 3.5 - 5.1 mmol/L    Comment: Please note change in reference range.   Chloride 99 96 - 112 mEq/L   CO2 22 19 - 32 mmol/L   Glucose, Bld 188 (H) 70 - 99 mg/dL   BUN 8 6 - 23 mg/dL   Creatinine, Ser 0.71 0.50 - 1.10 mg/dL   Calcium 8.3 (L) 8.4 - 10.5 mg/dL   GFR calc non Af Amer >90 >90 mL/min   GFR calc Af Amer >90 >90 mL/min    Comment: (NOTE) The eGFR has been calculated using the CKD EPI equation. This calculation has not been validated in all clinical situations. eGFR's persistently <90 mL/min signify possible Chronic Kidney Disease.    Anion gap 14 5 - 15  BNP (order ONLY if patient complains of dyspnea/SOB AND you have documented it for THIS visit)     Status: None   Collection Time: 11/03/14 12:22 PM  Result Value Ref Range   B Natriuretic Peptide 22.1 0.0 - 100.0 pg/mL    Comment: Please note change in reference range.  I-stat troponin, ED (not at [facility])     Status: None   Collection Time: 11/03/14 12:30 PM  Result Value Ref Range   Troponin i, poc 0.00 0.00 - 0.08 ng/mL   Comment 3            Comment: Due to the release kinetics of cTnI, a negative result within the first hours of  the onset of symptoms does not  rule out myocardial infarction with certainty. If myocardial infarction is still suspected, repeat the test at appropriate intervals.     Radiology Reports: Ct Angio Chest Pe W/cm &/or Wo Cm  11/03/2014   CLINICAL DATA:  Increased chest pain and SOB s/p recent stent placement. 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 History of COPD and lung cancer (receives chemo).Nurse note: Pt reports chest pain and sob intermittent over last few days. But consistent since last night. Sob worse with exertion. Had ?pulmonary artery stent placed 2 weeks ago. Sts "I'm not sure if the stent is even working, it feels the same as it did before the stent. 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 Denies n/v, dizziness.  EXAM: CT ANGIOGRAPHY CHEST WITH CONTRAST  TECHNIQUE: Multidetector CT imaging of the chest was performed using the standard protocol during bolus administration of intravenous contrast. Multiplanar CT image reconstructions and MIPs were obtained to evaluate the vascular anatomy.  CONTRAST:  100 cc Omnipaque 350  COMPARISON:  Chest CT dated 09/02/2014.  FINDINGS: The previously demonstrated right hilar mass is larger. The posterior extension previously measured 4.7 x 2.6 cm and currently measures 6.4 x 3.3 cm with increased vascular and bronchial encasement. The previously demonstrated 5.0 x 2.8 cm precarinal mediastinal component currently measures 5.5 x 2.3 cm. By my measurements, this previously measure 5.0 x 2.1 cm and corresponding dimensions.  A previously demonstrated 6 mm short axis right hilar lymph node is increased in size, currently A 10.5 mm short axis on image number 43. No enlarged lymph nodes elsewhere in the chest or upper abdomen.  There is a minimal pericardial effusion with a maximum thickness of 6 mm. There are multiple small, sub cm nodules in both lungs. Some of these are new and some are enlarging. An old, healed right anterolateral rib fracture  is again demonstrated. No bone metastases are visualized.  The pulmonary arteries are normally opacified with no pulmonary arterial filling defects seen. Moderate thoracic scoliosis is noted.  Review of the MIP images confirms the above findings.  IMPRESSION: 1.  No pulmonary emboli.  2. Enlarging right hilar and mediastinal lung cancer with increased vascular and bronchial encasement.  3. Multiple sub cm new and enlarging nodules in both lungs. These are suspicious for early metastases.  4.  Progressive right hilar metastatic adenopathy.   Electronically Signed   By: Enrique Sack M.D.   On: 11/03/2014 14:10    Electrocardiogram: Sinus tachycardia at 115 bpm. Normal axis. Intervals are normal. No concerning ST or T-wave changes.  Problem List  Principal Problem:   Chest pain Active Problems:   Primary cancer of right upper lobe of lung   Hypothyroidism   DM type 2 (diabetes mellitus, type 2)   COPD, moderate   SVC syndrome   Assessment: This is an unfortunate 59 year old Caucasian female with the history lung cancer, who presents with chest pain. EKG was nonischemic. CT scan does not show acute pulmonary embolism. However, there is progression of her cancer. She recently underwent stent placement in her superior vena cava for SVC syndrome. Pain appears to be secondary to cancer. GI etiology is also possibility. Despite 24 hours of pain her troponin is normal, so this is unlikely to be acute coronary syndrome. She is concerned about her increasing tumor size.  Plan: #1 chest pain: Most likely due to her cancer. However, we'll observe her overnight and rule her out for acute coronary syndrome by serial troponins. EKG will be repeated in  the morning. Echocardiogram will be obtained. Pain medications will be provided for the pain. She may benefit from a PPI which will be initiated.  #2 History of for SVC syndrome, status post recent SVC stent placement on December 31: I discussed this issue with the  interventional radiologist. He reviewed the CT angiogram and saw that the SVC was opened. So it is unlikely this is causing her symptoms. Continue to monitor closely. She doesn't have any symptoms or signs of SVC syndrome, otherwise.  #3 history of lung cancer: Her lung tumor appears to be increasing in size. Her last immunotherapy was in December. It was held due to the onset of SVC syndrome. She was also seen by radiation oncologist recently. She is not thought to be a candidate for further radiation treatment based on the notes. Today's CT scan does show progression of disease. This will need to be discussed further with her oncologist in the morning. Her cancer is thought to be the main reason for her pain.  #4 Sinus Tachycardia: She does have a known history of hypothyroidism. Recheck TSH and free T4. We'll also follow up on echocardiogram. Monitor her on telemetry for now.  #5 Diabetes mellitus type 2: Place her on sliding scale coverage.  #6 history of COPD: Continue with her nebulizer treatments. Few scattered wheezing heard on examination, but mostly clear.  #7 History of tobacco abuse: She still smokes occasionally. Continue with her Chantix.  DVT Prophylaxis: Lovenox Code Status: Full code Family Communication: Discussed with the patient  Disposition Plan: Observe to telemetry   Further management decisions will depend on results of further testing and patient's response to treatment.   [facility] [place]  [facility] Pager [phone]  If 7PM-7AM, please contact night-coverage [email or web address] Password [facility]  11/03/2014, 3:33 PM

## 2014-11-03 NOTE — ED Notes (Signed)
Patient transported to CT 

## 2014-11-04 DIAGNOSIS — E119 Type 2 diabetes mellitus without complications: Secondary | ICD-10-CM | POA: Diagnosis not present

## 2014-11-04 DIAGNOSIS — D630 Anemia in neoplastic disease: Secondary | ICD-10-CM

## 2014-11-04 DIAGNOSIS — E039 Hypothyroidism, unspecified: Secondary | ICD-10-CM | POA: Diagnosis not present

## 2014-11-04 DIAGNOSIS — J449 Chronic obstructive pulmonary disease, unspecified: Secondary | ICD-10-CM | POA: Diagnosis not present

## 2014-11-04 DIAGNOSIS — I517 Cardiomegaly: Secondary | ICD-10-CM

## 2014-11-04 DIAGNOSIS — I313 Pericardial effusion (noninflammatory): Secondary | ICD-10-CM

## 2014-11-04 DIAGNOSIS — R0602 Shortness of breath: Secondary | ICD-10-CM

## 2014-11-04 DIAGNOSIS — C3411 Malignant neoplasm of upper lobe, right bronchus or lung: Secondary | ICD-10-CM | POA: Diagnosis not present

## 2014-11-04 DIAGNOSIS — E46 Unspecified protein-calorie malnutrition: Secondary | ICD-10-CM

## 2014-11-04 DIAGNOSIS — I871 Compression of vein: Secondary | ICD-10-CM

## 2014-11-04 LAB — COMPREHENSIVE METABOLIC PANEL
ALK PHOS: 69 U/L (ref 39–117)
ALT: 13 U/L (ref 0–35)
AST: 19 U/L (ref 0–37)
Albumin: 3.4 g/dL — ABNORMAL LOW (ref 3.5–5.2)
Anion gap: 8 (ref 5–15)
BUN: 8 mg/dL (ref 6–23)
CO2: 27 mmol/L (ref 19–32)
Calcium: 8.2 mg/dL — ABNORMAL LOW (ref 8.4–10.5)
Chloride: 100 mEq/L (ref 96–112)
Creatinine, Ser: 0.61 mg/dL (ref 0.50–1.10)
GFR calc Af Amer: >90 mL/min (ref >90)
GFR calc non Af Amer: >90 mL/min (ref >90)
GLUCOSE: 173 mg/dL — AB (ref 70–99)
POTASSIUM: 3.8 mmol/L (ref 3.5–5.1)
SODIUM: 135 mmol/L (ref 135–145)
Total Bilirubin: 0.6 mg/dL (ref 0.3–1.2)
Total Protein: 7 g/dL (ref 6.0–8.3)

## 2014-11-04 LAB — GLUCOSE, CAPILLARY
GLUCOSE-CAPILLARY: 163 mg/dL — AB (ref 70–99)
Glucose-Capillary: 180 mg/dL — ABNORMAL HIGH (ref 70–99)
Glucose-Capillary: 149 mg/dL — ABNORMAL HIGH (ref 70–99)

## 2014-11-04 LAB — CBC
HEMATOCRIT: 36.6 % (ref 36.0–46.0)
Hemoglobin: 11.6 g/dL — ABNORMAL LOW (ref 12.0–15.0)
MCH: 26.7 pg (ref 26.0–34.0)
MCHC: 31.7 g/dL (ref 30.0–36.0)
MCV: 84.3 fL (ref 78.0–100.0)
Platelets: 215 10*3/uL (ref 150–400)
RBC: 4.34 MIL/uL (ref 3.87–5.11)
RDW: 15.4 % (ref 11.5–15.5)
WBC: 7 10*3/uL (ref 4.0–10.5)

## 2014-11-04 LAB — TROPONIN I

## 2014-11-04 NOTE — Progress Notes (Signed)
  Echocardiogram 2D Echocardiogram has been performed.  Janice Ball 11/04/2014, 5:11 PM

## 2014-11-04 NOTE — Progress Notes (Addendum)
[facility] PROGRESS NOTE  Janice Ball ZOX:[id number] DOB: [birth year] DOA: 11/03/2014 PCP: [name], NP  Assessment/Plan: Principal Problem:   Chest pain Active Problems:   Primary cancer of right upper lobe of lung   Hypothyroidism   DM type 2 (diabetes mellitus, type 2)   COPD, moderate   SVC syndrome   History of for SVC syndrome, status post recent SVC stent placement on December 31: Dr. [place] Pink discussed this issue with the interventional radiologist. He reviewed the CT angiogram and saw that the SVC was opened. So it is unlikely this is causing her chest pain. Continue to monitor closely. She doesn't have any symptoms or signs of SVC syndrome, otherwise.   Non Small Cell Lung Cancer, recurrent Appreciate oncology input This is a very pleasant 59 years old white female with recurrent non-small cell lung cancer, off Nivolumab due to progression of the disease  She is not a radiation candidate unless more symptomatic SCV syndrome were noted She had a SVC stent on 10/17/14 by IR  Chest pain, ruled out for pulmonary embolism, ACS, DC telemetry today Likely due to tumor  CT angiogram on 1/17 is negative for pulmonary embolism. Tumor appears to be increasing in size. SCV stent is open 2 D echo pending Troponins were normal Continue PPI/pain meds  Diabetes mellitus type 2: Place her on sliding scale coverage.  history of COPD: Continue with her nebulizer treatments. Few scattered wheezing heard on examination, but mostly clear.  History of tobacco abuse: She still smokes occasionally. Continue with her Chantix.  Anemia of neoplastic disease Due to recent chemotherapy, malnutrition. No bleeding issues noted No transfusion is indicated at this time Monitor counts closely Transfuse blood to maintain a Hb of 8 g or if the patient is acutely bleeding  Malnutrition Consider Nutrition evaluation   Code Status: full Family Communication: family updated about  patient's clinical progress Disposition Plan:  Anticipate discharge tomorrow    Brief narrative: Janice Ball is a 59 y.o. female with a past medical history of lung cancer, SVC syndrome, status post SVC stent placement on 10/17/2014, diabetes mellitus type 2, COPD, hypothyroidism, who was in her usual state of health till yesterday when she started noticing pain in the central part of her chest towards the left side. There was no radiation. The pain would come and go. At its worst it was 5 out of 10 in intensity. She had a cough which is chronic for her. No expectoration. No bloody sputum. She's had chills but no fever. No dizziness, but has been nauseated. Denies any vomiting. When she takes a deep breath she feels a strange sensation in her chest. She is unable to describe this. Denies being on oxygen at home. Denies any history of heart disease. No history of stress test.   Consultants:  Oncology  Procedures:  None  Antibiotics: None  HPI/Subjective: Patient complaining of chest pain and shortness of breath, also very anxious and requesting Ativan  Objective: Filed Vitals:   11/03/14 2033 11/03/14 2112 11/04/14 0450 11/04/14 0740  BP:  139/84 159/94   Pulse:  113 109   Temp:  97.9 F (36.6 C) 98.3 F (36.8 C)   TempSrc:  Oral Oral   Resp:  22 20   Height:      Weight:      SpO2: 97% 97% 93% 95%    Intake/Output Summary (Last 24 hours) at 11/04/14 0943 Last data filed at 11/04/14 0451  Gross per 24 hour  Intake  1357.5 ml  Output    600 ml  Net  757.5 ml    Exam:  General: alert & oriented x 3 In NAD  Cardiovascular: RRR, nl S1 s2  Respiratory: Decreased breath sounds at the bases, scattered rhonchi, no crackles  Abdomen: soft +BS NT/ND, no masses palpable  Extremities: No cyanosis and no edema      Data Reviewed: Basic Metabolic Panel:  Recent Labs Lab 11/03/14 1222 11/04/14 0435  NA 135 135  K 3.8 3.8  CL 99 100  CO2 22 27  GLUCOSE 188* 173*   BUN 8 8  CREATININE 0.71 0.61  CALCIUM 8.3* 8.2*    Liver Function Tests:  Recent Labs Lab 11/04/14 0435  AST 19  ALT 13  ALKPHOS 69  BILITOT 0.6  PROT 7.0  ALBUMIN 3.4*   No results for input(s): LIPASE, AMYLASE in the last 168 hours. No results for input(s): AMMONIA in the last 168 hours.  CBC:  Recent Labs Lab 11/03/14 1222 11/04/14 0435  WBC 7.2 7.0  HGB 12.1 11.6*  HCT 37.4 36.6  MCV 83.9 84.3  PLT 192 215    Cardiac Enzymes:  Recent Labs Lab 11/03/14 1627 11/03/14 2148 11/04/14 0435  TROPONINI <0.03 <0.03 <0.03   BNP (last 3 results) No results for input(s): PROBNP in the last 8760 hours.   CBG:  Recent Labs Lab 11/03/14 1659 11/03/14 2122 11/04/14 0721  GLUCAP 83 186* 163*    No results found for this or any previous visit (from the past 240 hour(s)).   Studies: Ct Angio Chest Pe W/cm &/or Wo Cm  11/03/2014   CLINICAL DATA:  Increased chest pain and SOB s/p recent stent placement. 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 History of COPD and lung cancer (receives chemo).Nurse note: Pt reports chest pain and sob intermittent over last few days. But consistent since last night. Sob worse with exertion. Had ?pulmonary artery stent placed 2 weeks ago. Sts "I'm not sure if the stent is even working, it feels the same as it did before the stent. 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 Denies n/v, dizziness.  EXAM: CT ANGIOGRAPHY CHEST WITH CONTRAST  TECHNIQUE: Multidetector CT imaging of the chest was performed using the standard protocol during bolus administration of intravenous contrast. Multiplanar CT image reconstructions and MIPs were obtained to evaluate the vascular anatomy.  CONTRAST:  100 cc Omnipaque 350  COMPARISON:  Chest CT dated 09/02/2014.  FINDINGS: The previously demonstrated right hilar mass is larger. The posterior extension previously measured 4.7 x 2.6 cm and currently measures 6.4 x 3.3 cm with increased vascular and  bronchial encasement. The previously demonstrated 5.0 x 2.8 cm precarinal mediastinal component currently measures 5.5 x 2.3 cm. By my measurements, this previously measure 5.0 x 2.1 cm and corresponding dimensions.  A previously demonstrated 6 mm short axis right hilar lymph node is increased in size, currently A 10.5 mm short axis on image number 43. No enlarged lymph nodes elsewhere in the chest or upper abdomen.  There is a minimal pericardial effusion with a maximum thickness of 6 mm. There are multiple small, sub cm nodules in both lungs. Some of these are new and some are enlarging. An old, healed right anterolateral rib fracture is again demonstrated. No bone metastases are visualized.  The pulmonary arteries are normally opacified with no pulmonary arterial filling defects seen. Moderate thoracic scoliosis is noted.  Review of the MIP images confirms the above findings.  IMPRESSION: 1.  No pulmonary emboli.  2. Enlarging right hilar and mediastinal lung cancer with increased vascular and bronchial encasement.  3. Multiple sub cm new and enlarging nodules in both lungs. These are suspicious for early metastases.  4.  Progressive right hilar metastatic adenopathy.   Electronically Signed   By: Enrique Sack M.D.   On: 11/03/2014 14:10   Ct Angio Chest Pe W/cm &/or Wo Cm  10/09/2014   CLINICAL DATA:  Worsening shortness of breath for 3 days. History of lung cancer and COPD, last chemotherapy treatment October 02, 2014  EXAM: CT ANGIOGRAPHY CHEST WITH CONTRAST  TECHNIQUE: Multidetector CT imaging of the chest was performed using the standard protocol during bolus administration of intravenous contrast. Multiplanar CT image reconstructions and MIPs were obtained to evaluate the vascular anatomy.  CONTRAST:  162mL OMNIPAQUE IOHEXOL 350 MG/ML SOLN  COMPARISON:  Chest radiograph October 09, 2014 at 1442 hr and CT of the chest, abdomen and pelvis September 02, 2014  FINDINGS: Adequate pulmonary arterial contrast  opacification. Main pulmonary artery is not enlarged. No acute pulmonary embolism to the level of the subsegmental branches. Encasement of the RIGHT upper lobe pulmonary artery due to RIGHT hilar/upper lobe mass, previously measuring 2.6 by 4.7 cm, now approximately 6.7 x 3.6 cm, with effaced RIGHT upper lobe bronchi, worse. Surrounding consolidation, centrilobular nodular densities. No pleural effusions. No pneumothorax.  Heart size is normal. Pericardial thickening without effusion. Thoracic aorta is normal in course and caliber with moderate calcific atherosclerosis. RIGHT hilar lymphadenopathy is confluent from the mass, difficult to discretely identified.  Included view of the abdomen is nonsuspicious. Probable thyroidectomy. Scoliosis without destructive bony lesions.  Review of the MIP images confirms the above findings.  IMPRESSION: No acute pulmonary embolism.  Increasing size of RIGHT upper lobe mass consistent with disease progression, surrounding centrilobular nodular densities concerning for lymphangitic spread of disease, possible radiation pneumonitis in the proper clinical setting. RIGHT upper lobe bronchi effacement.   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 Her initial severe plethora has responded to steroids, however she continues to experience swelling of her face, neck and shoulders. Additionally, she has numerous new collateral vessels developing across her chest.  She presents today for superior vena cavagram with possible angioplasty versus stenting. Additionally, she has a right IJ portacatheter which she will continue to need. The catheter will likely be revised if a stent is placed.  EXAM: IR ULTRASOUND GUIDANCE VASC ACCESS RIGHT; CENTRAL VENOUS CATHETER; SUPERIOR VENA  CAVOGRAM; IR TRANSCATH PLC STENT INITIAL VEIN INC ANGIOPLASTY  Date: 10/17/2014  PROCEDURE: 1. Ultrasound-guided puncture of the right internal jugular vein 2. Ultrasound-guided puncture of the right common femoral vein 3. Catheterization of the superior vena cava with superior vena cavagram 4. Catheterization of the left innominate vein with venogram 5. Catheterization of the right innominate vein with venogram 6. Placement of a 12 x 60 mm self expanding Nitinol stent across the SVC stenosis 7. Revision of existing port catheter. The catheter tip was externalized an Re internalized essentially through the stent and into the upper right atrium. Interventional Radiologist:  Criselda Peaches, MD  ANESTHESIA/SEDATION: Moderate (conscious) sedation was used. 4 mg Versed, 200 mcg Fentanyl were administered intravenously. The patient's vital signs were monitored continuously by radiology nursing throughout the procedure.  Sedation Time: 75 minutes  MEDICATIONS: 1 g vancomycin administered intravenously  FLUOROSCOPY TIME:  8 min 6 seconds  787 mGy  CONTRAST:  57mL OMNIPAQUE IOHEXOL 300 MG/ML  SOLN  TECHNIQUE: Informed consent was obtained from the patient following explanation of the procedure, risks, benefits and alternatives. The patient understands, agrees and consents for the procedure. All questions were addressed. A time out was performed.  Maximal barrier sterile technique utilized including caps, mask, sterile gowns, sterile gloves, large sterile drape, hand hygiene, and chlorhexidine skin prep.  The right neck was interrogated with ultrasound. The internal jugular vein remains patent. An image was obtained and stored for the medical record. Local anesthesia was attained by infiltration with 1% lidocaine. Using fluoroscopy and a hemostat marker, the course of the existing port catheter was identified and marked. A small dermatotomy was then made overlying the course of the catheter. The right internal jugular  vein was punctured using a 21 gauge micropuncture needle. A micro wire and micro sheath were advanced into the superior vena cava.  Attention was next turned to the right groin. The right common femoral vein was interrogated with ultrasound and found to be patent. An image was obtained and stored. Local anesthesia was attained by infiltration with 1% lidocaine. A small dermatotomy was made. Under real-time sonographic guidance, the right femoral vein was punctured with a 21 gauge micropuncture needle. With the assistance of a 5 [place] transitional micro sheath, a 0.035 Bentson wire was advanced into the inferior vena cava. The micro sheath was then exchanged for a working 7 [place] vascular sheath.  A pigtail catheter was advanced to the superior vena cava any superior vena cavagram was performed. The pigtail catheter was next advanced into the left innominate vein and a left innominate venogram was performed. The catheter was then advanced into the right innominate vein and a right innominate venogram was performed.  There is high-grade stenosis of the mid SVC encircling the existing port catheter. The bilateral innominate veins remain patent. Numerous small collateral vessels are identified. The stenosis appears to be secondary to external compression from the patient's known mass.  Decision was made to proceed with stenting.  Attention was returned to the neck. The existing port catheter was dissected an extracted from the right internal jugular vein. This catheter was wrapped in a sterile towel and left externalized for the time being.  The pigtail catheter was removed over a Rosen wire. A 12 x 60 mm Cordis smart stent was selected and position across the region of stenosis. The catheter was successfully deployed and post dilated to 8 mm using an 8 x 40 mm Conquest balloon catheter which was hand inflated.  Attention was again turned to the right neck. Using fluoroscopy an intermittently coughing contrast. The  existing right IJ micropuncture sheath was carefully withdrawn until it lay above the SVC stent. Eight fixed J wire was then carefully advanced through the existing stent. A J-wire was maintained centrally within the stent what was viewed in multiple obliquities to confirm that it was indeed passing through the lumen of the stent and not around the stent. The wire was then advanced through the right atrium and into the inferior vena cava. An 8 French peel-away sheath was carefully advanced over the wire under real-time fluoroscopic guidance to ensure that it did not impinge the existing stent. Once the peel-away sheath was in place, the externalized catheter was carefully flushed, clean then advanced to the peel-away sheath. The tip of the catheter  is in the mid aspect of the right atrium. This likely a just upward when the patient resumes a more upright position.  The incision was closed with interrupted inverted subdermal Vicryl sutures in the epidermis sealed with Dermabond.  The right groin sheath was pulled and hemostasis attained by gentle manual pressure.  COMPLICATIONS: None  IMPRESSION: 1. Successful stenting of the superior vena cava stenosis using a 12 x 60 mm Cordis self expanding smart stent. 2. Revision of the existing right IJ approach port catheter. The catheter now courses through the newly placed stent and the tip terminates in the mid to upper right atrium. The portacatheter was flushed and remains well functioning. Signed,  Criselda Peaches, MD  [facility]  [facility]   Electronically Signed   By: Jacqulynn Cadet M.D.   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 Her initial severe plethora has responded to steroids, however she continues to experience swelling of her face,  neck and shoulders. Additionally, she has numerous new collateral vessels developing across her chest.  She presents today for superior vena cavagram with possible angioplasty versus stenting. Additionally, she has a right IJ portacatheter which she will continue to need. The catheter will likely be revised if a stent is placed.  EXAM: IR ULTRASOUND GUIDANCE VASC ACCESS RIGHT; CENTRAL VENOUS CATHETER; SUPERIOR VENA CAVOGRAM; IR TRANSCATH PLC STENT INITIAL VEIN INC ANGIOPLASTY  Date: 10/17/2014  PROCEDURE: 1. Ultrasound-guided puncture of the right internal jugular vein 2. Ultrasound-guided puncture of the right common femoral vein 3. Catheterization of the superior vena cava with superior vena cavagram 4. Catheterization of the left innominate vein with venogram 5. Catheterization of the right innominate vein with venogram 6. Placement of a 12 x 60 mm self expanding Nitinol stent across the SVC stenosis 7. Revision of existing port catheter. The catheter tip was externalized an Re internalized essentially through the stent and into the upper right atrium. Interventional Radiologist:  Criselda Peaches, MD  ANESTHESIA/SEDATION: Moderate (conscious) sedation was used. 4 mg Versed, 200 mcg Fentanyl were administered intravenously. The patient's vital signs were monitored continuously by radiology nursing throughout the procedure.  Sedation Time: 75 minutes  MEDICATIONS: 1 g vancomycin administered intravenously  FLUOROSCOPY TIME:  8 min 6 seconds  787 mGy  CONTRAST:  72mL OMNIPAQUE IOHEXOL 300 MG/ML  SOLN  TECHNIQUE: Informed consent was obtained from the patient following explanation of the procedure, risks, benefits and alternatives. The patient understands, agrees and consents for the procedure. All questions were addressed. A time out was performed.  Maximal barrier sterile technique utilized including caps, mask, sterile gowns, sterile gloves, large sterile drape, hand hygiene, and chlorhexidine skin prep.  The  right neck was interrogated with ultrasound. The internal jugular vein remains patent. An image was obtained and stored for the medical record. Local anesthesia was attained by infiltration with 1% lidocaine. Using fluoroscopy and a hemostat marker, the course of the existing port catheter was identified and marked. A small dermatotomy was then made overlying the course of the catheter. The right internal jugular vein was punctured using a 21 gauge micropuncture needle. A micro wire and micro sheath were advanced into the superior vena cava.  Attention was next turned to the right groin. The right common femoral vein was interrogated with ultrasound and found to  be patent. An image was obtained and stored. Local anesthesia was attained by infiltration with 1% lidocaine. A small dermatotomy was made. Under real-time sonographic guidance, the right femoral vein was punctured with a 21 gauge micropuncture needle. With the assistance of a 5 [place] transitional micro sheath, a 0.035 Bentson wire was advanced into the inferior vena cava. The micro sheath was then exchanged for a working 7 [place] vascular sheath.  A pigtail catheter was advanced to the superior vena cava any superior vena cavagram was performed. The pigtail catheter was next advanced into the left innominate vein and a left innominate venogram was performed. The catheter was then advanced into the right innominate vein and a right innominate venogram was performed.  There is high-grade stenosis of the mid SVC encircling the existing port catheter. The bilateral innominate veins remain patent. Numerous small collateral vessels are identified. The stenosis appears to be secondary to external compression from the patient's known mass.  Decision was made to proceed with stenting.  Attention was returned to the neck. The existing port catheter was dissected an extracted from the right internal jugular vein. This catheter was wrapped in a sterile towel and left  externalized for the time being.  The pigtail catheter was removed over a Rosen wire. A 12 x 60 mm Cordis smart stent was selected and position across the region of stenosis. The catheter was successfully deployed and post dilated to 8 mm using an 8 x 40 mm Conquest balloon catheter which was hand inflated.  Attention was again turned to the right neck. Using fluoroscopy an intermittently coughing contrast. The existing right IJ micropuncture sheath was carefully withdrawn until it lay above the SVC stent. Eight fixed J wire was then carefully advanced through the existing stent. A J-wire was maintained centrally within the stent what was viewed in multiple obliquities to confirm that it was indeed passing through the lumen of the stent and not around the stent. The wire was then advanced through the right atrium and into the inferior vena cava. An 8 French peel-away sheath was carefully advanced over the wire under real-time fluoroscopic guidance to ensure that it did not impinge the existing stent. Once the peel-away sheath was in place, the externalized catheter was carefully flushed, clean then advanced to the peel-away sheath. The tip of the catheter is in the mid aspect of the right atrium. This likely a just upward when the patient resumes a more upright position.  The incision was closed with interrupted inverted subdermal Vicryl sutures in the epidermis sealed with Dermabond.  The right groin sheath was pulled and hemostasis attained by gentle manual pressure.  COMPLICATIONS: None  IMPRESSION: 1. Successful stenting of the superior vena cava stenosis using a 12 x 60 mm Cordis self expanding smart stent. 2. Revision of the existing right IJ approach port catheter. The catheter now courses through the newly placed stent and the tip terminates in the mid to upper right atrium. The portacatheter was flushed and remains well functioning. Signed,  Criselda Peaches, MD  [facility]  [facility]   Electronically Signed   By: Jacqulynn Cadet M.D.   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 Her initial severe plethora has responded to steroids, however she continues to experience swelling of her face,  neck and shoulders. Additionally, she has numerous new collateral vessels developing across her chest.  She presents today for superior vena cavagram with possible angioplasty versus stenting. Additionally, she has a right IJ portacatheter which she will continue to need. The catheter will likely be revised if a stent is placed.  EXAM: IR ULTRASOUND GUIDANCE VASC ACCESS RIGHT; CENTRAL VENOUS CATHETER; SUPERIOR VENA CAVOGRAM; IR TRANSCATH PLC STENT INITIAL VEIN INC ANGIOPLASTY  Date: 10/17/2014  PROCEDURE: 1. Ultrasound-guided puncture of the right internal jugular vein 2. Ultrasound-guided puncture of the right common femoral vein 3. Catheterization of the superior vena cava with superior vena cavagram 4. Catheterization of the left innominate vein with venogram 5. Catheterization of the right innominate vein with venogram 6. Placement of a 12 x 60 mm self expanding Nitinol stent across the SVC stenosis 7. Revision of existing port catheter. The catheter tip was externalized an Re internalized essentially through the stent and into the upper right atrium. Interventional Radiologist:  Criselda Peaches, MD  ANESTHESIA/SEDATION: Moderate (conscious) sedation was used. 4 mg Versed, 200 mcg Fentanyl were administered intravenously. The patient's vital signs were monitored continuously by radiology nursing throughout the procedure.  Sedation Time: 75 minutes  MEDICATIONS: 1 g vancomycin administered intravenously  FLUOROSCOPY TIME:  8 min 6 seconds  787 mGy  CONTRAST:  62mL OMNIPAQUE IOHEXOL 300 MG/ML   SOLN  TECHNIQUE: Informed consent was obtained from the patient following explanation of the procedure, risks, benefits and alternatives. The patient understands, agrees and consents for the procedure. All questions were addressed. A time out was performed.  Maximal barrier sterile technique utilized including caps, mask, sterile gowns, sterile gloves, large sterile drape, hand hygiene, and chlorhexidine skin prep.  The right neck was interrogated with ultrasound. The internal jugular vein remains patent. An image was obtained and stored for the medical record. Local anesthesia was attained by infiltration with 1% lidocaine. Using fluoroscopy and a hemostat marker, the course of the existing port catheter was identified and marked. A small dermatotomy was then made overlying the course of the catheter. The right internal jugular vein was punctured using a 21 gauge micropuncture needle. A micro wire and micro sheath were advanced into the superior vena cava.  Attention was next turned to the right groin. The right common femoral vein was interrogated with ultrasound and found to be patent. An image was obtained and stored. Local anesthesia was attained by infiltration with 1% lidocaine. A small dermatotomy was made. Under real-time sonographic guidance, the right femoral vein was punctured with a 21 gauge micropuncture needle. With the assistance of a 5 [place] transitional micro sheath, a 0.035 Bentson wire was advanced into the inferior vena cava. The micro sheath was then exchanged for a working 7 [place] vascular sheath.  A pigtail catheter was advanced to the superior vena cava any superior vena cavagram was performed. The pigtail catheter was next advanced into the left innominate vein and a left innominate venogram was performed. The catheter was then advanced into the right innominate vein and a right innominate venogram was performed.  There is high-grade stenosis of the mid SVC encircling the existing port  catheter. The bilateral innominate veins remain patent. Numerous small collateral vessels are identified. The stenosis appears to be secondary to external compression from the patient's known mass.  Decision was made to proceed with stenting.  Attention was returned to the neck. The existing port catheter was dissected an extracted from the right internal jugular vein. This catheter was wrapped  in a sterile towel and left externalized for the time being.  The pigtail catheter was removed over a Rosen wire. A 12 x 60 mm Cordis smart stent was selected and position across the region of stenosis. The catheter was successfully deployed and post dilated to 8 mm using an 8 x 40 mm Conquest balloon catheter which was hand inflated.  Attention was again turned to the right neck. Using fluoroscopy an intermittently coughing contrast. The existing right IJ micropuncture sheath was carefully withdrawn until it lay above the SVC stent. Eight fixed J wire was then carefully advanced through the existing stent. A J-wire was maintained centrally within the stent what was viewed in multiple obliquities to confirm that it was indeed passing through the lumen of the stent and not around the stent. The wire was then advanced through the right atrium and into the inferior vena cava. An 8 French peel-away sheath was carefully advanced over the wire under real-time fluoroscopic guidance to ensure that it did not impinge the existing stent. Once the peel-away sheath was in place, the externalized catheter was carefully flushed, clean then advanced to the peel-away sheath. The tip of the catheter is in the mid aspect of the right atrium. This likely a just upward when the patient resumes a more upright position.  The incision was closed with interrupted inverted subdermal Vicryl sutures in the epidermis sealed with Dermabond.  The right groin sheath was pulled and hemostasis attained by gentle manual pressure.  COMPLICATIONS: None   IMPRESSION: 1. Successful stenting of the superior vena cava stenosis using a 12 x 60 mm Cordis self expanding smart stent. 2. Revision of the existing right IJ approach port catheter. The catheter now courses through the newly placed stent and the tip terminates in the mid to upper right atrium. The portacatheter was flushed and remains well functioning. Signed,  Criselda Peaches, MD  [facility]  [facility]   Electronically Signed   By: Jacqulynn Cadet M.D.   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 Her initial severe plethora has responded to steroids, however she continues to experience swelling of her face, neck and shoulders. Additionally, she has numerous new collateral vessels developing across her chest.  She presents today for superior vena cavagram with possible angioplasty versus stenting. Additionally, she has a right IJ portacatheter which she will continue to need. The catheter will likely be revised if a stent is placed.  EXAM: IR ULTRASOUND GUIDANCE VASC ACCESS RIGHT; CENTRAL VENOUS CATHETER; SUPERIOR VENA CAVOGRAM; IR TRANSCATH PLC STENT INITIAL VEIN INC ANGIOPLASTY  Date: 10/17/2014  PROCEDURE: 1. Ultrasound-guided puncture of the right internal jugular vein 2. Ultrasound-guided puncture of the right common femoral vein 3. Catheterization of the superior vena cava with superior vena cavagram 4. Catheterization of the left innominate vein with venogram 5. Catheterization of the right innominate vein with venogram 6. Placement of a 12 x 60 mm self expanding Nitinol stent across the SVC stenosis 7. Revision of existing port catheter. The catheter tip was externalized an Re internalized essentially through the stent and into the upper right atrium. Interventional  Radiologist:  Criselda Peaches, MD  ANESTHESIA/SEDATION: Moderate (conscious) sedation was used. 4 mg Versed, 200 mcg Fentanyl were administered intravenously. The patient's vital signs were monitored continuously by radiology  nursing throughout the procedure.  Sedation Time: 75 minutes  MEDICATIONS: 1 g vancomycin administered intravenously  FLUOROSCOPY TIME:  8 min 6 seconds  787 mGy  CONTRAST:  70mL OMNIPAQUE IOHEXOL 300 MG/ML  SOLN  TECHNIQUE: Informed consent was obtained from the patient following explanation of the procedure, risks, benefits and alternatives. The patient understands, agrees and consents for the procedure. All questions were addressed. A time out was performed.  Maximal barrier sterile technique utilized including caps, mask, sterile gowns, sterile gloves, large sterile drape, hand hygiene, and chlorhexidine skin prep.  The right neck was interrogated with ultrasound. The internal jugular vein remains patent. An image was obtained and stored for the medical record. Local anesthesia was attained by infiltration with 1% lidocaine. Using fluoroscopy and a hemostat marker, the course of the existing port catheter was identified and marked. A small dermatotomy was then made overlying the course of the catheter. The right internal jugular vein was punctured using a 21 gauge micropuncture needle. A micro wire and micro sheath were advanced into the superior vena cava.  Attention was next turned to the right groin. The right common femoral vein was interrogated with ultrasound and found to be patent. An image was obtained and stored. Local anesthesia was attained by infiltration with 1% lidocaine. A small dermatotomy was made. Under real-time sonographic guidance, the right femoral vein was punctured with a 21 gauge micropuncture needle. With the assistance of a 5 [place] transitional micro sheath, a 0.035 Bentson wire was advanced into the inferior vena cava. The micro sheath was then exchanged for  a working 7 [place] vascular sheath.  A pigtail catheter was advanced to the superior vena cava any superior vena cavagram was performed. The pigtail catheter was next advanced into the left innominate vein and a left innominate venogram was performed. The catheter was then advanced into the right innominate vein and a right innominate venogram was performed.  There is high-grade stenosis of the mid SVC encircling the existing port catheter. The bilateral innominate veins remain patent. Numerous small collateral vessels are identified. The stenosis appears to be secondary to external compression from the patient's known mass.  Decision was made to proceed with stenting.  Attention was returned to the neck. The existing port catheter was dissected an extracted from the right internal jugular vein. This catheter was wrapped in a sterile towel and left externalized for the time being.  The pigtail catheter was removed over a Rosen wire. A 12 x 60 mm Cordis smart stent was selected and position across the region of stenosis. The catheter was successfully deployed and post dilated to 8 mm using an 8 x 40 mm Conquest balloon catheter which was hand inflated.  Attention was again turned to the right neck. Using fluoroscopy an intermittently coughing contrast. The existing right IJ micropuncture sheath was carefully withdrawn until it lay above the SVC stent. Eight fixed J wire was then carefully advanced through the existing stent. A J-wire was maintained centrally within the stent what was viewed in multiple obliquities to confirm that it was indeed passing through the lumen of the stent and not around the stent. The wire was then advanced through the right atrium and into the inferior vena cava. An 8 French peel-away sheath was carefully advanced over the wire under real-time fluoroscopic guidance to ensure that it did not impinge the existing stent. Once the peel-away sheath was in place, the externalized catheter was  carefully flushed, clean then advanced to the peel-away sheath.  The tip of the catheter is in the mid aspect of the right atrium. This likely a just upward when the patient resumes a more upright position.  The incision was closed with interrupted inverted subdermal Vicryl sutures in the epidermis sealed with Dermabond.  The right groin sheath was pulled and hemostasis attained by gentle manual pressure.  COMPLICATIONS: None  IMPRESSION: 1. Successful stenting of the superior vena cava stenosis using a 12 x 60 mm Cordis self expanding smart stent. 2. Revision of the existing right IJ approach port catheter. The catheter now courses through the newly placed stent and the tip terminates in the mid to upper right atrium. The portacatheter was flushed and remains well functioning. Signed,  Criselda Peaches, MD  [facility]  [facility]   Electronically Signed   By: Jacqulynn Cadet M.D.   On: 10/17/2014 19:00   Dg Chest Port 1 View  10/09/2014   CLINICAL DATA:  Shortness of breath for 3 days, productive cough, history of lung carcinoma  EXAM: PORTABLE CHEST - 1 VIEW  COMPARISON:  09/02/2014  FINDINGS: Cardiac shadow is within normal limits. A right chest wall port is again seen with the catheter tip at the cavoatrial junction. Left lung remains clear. There are its right suprahilar changes again noted consistent with the patient's given clinical history. No new effusion or focal infiltrate is seen.  IMPRESSION: Stable changes when compared with the prior CT examination consistent with the patient's given clinical history No acute abnormality is noted.   Electronically Signed   By: Inez Catalina M.D.   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 Her initial severe  plethora has responded to steroids, however she continues to experience swelling of her face, neck and shoulders. Additionally, she has numerous new collateral vessels developing across her chest.  She presents today for superior vena cavagram with possible angioplasty versus stenting. Additionally, she has a right IJ portacatheter which she will continue to need. The catheter will likely be revised if a stent is placed.  EXAM: IR ULTRASOUND GUIDANCE VASC ACCESS RIGHT; CENTRAL VENOUS CATHETER; SUPERIOR VENA CAVOGRAM; IR TRANSCATH PLC STENT INITIAL VEIN INC ANGIOPLASTY  Date: 10/17/2014  PROCEDURE: 1. Ultrasound-guided puncture of the right internal jugular vein 2. Ultrasound-guided puncture of the right common femoral vein 3. Catheterization of the superior vena cava with superior vena cavagram 4. Catheterization of the left innominate vein with venogram 5. Catheterization of the right innominate vein with venogram 6. Placement of a 12 x 60 mm self expanding Nitinol stent across the SVC stenosis 7. Revision of existing port catheter. The catheter tip was externalized an Re internalized essentially through the stent and into the upper right atrium. Interventional Radiologist:  Criselda Peaches, MD  ANESTHESIA/SEDATION: Moderate (conscious) sedation was used. 4 mg Versed, 200 mcg Fentanyl were administered intravenously. The patient's vital signs were monitored continuously by radiology nursing throughout the procedure.  Sedation Time: 75 minutes  MEDICATIONS: 1 g vancomycin administered intravenously  FLUOROSCOPY TIME:  8 min 6 seconds  787 mGy  CONTRAST:  31mL OMNIPAQUE IOHEXOL 300 MG/ML  SOLN  TECHNIQUE: Informed consent was obtained from the patient following explanation of the procedure, risks, benefits and alternatives. The patient understands, agrees and consents for the procedure. All questions were  addressed. A time out was performed.  Maximal barrier sterile technique utilized including caps, mask,  sterile gowns, sterile gloves, large sterile drape, hand hygiene, and chlorhexidine skin prep.  The right neck was interrogated with ultrasound. The internal jugular vein remains patent. An image was obtained and stored for the medical record. Local anesthesia was attained by infiltration with 1% lidocaine. Using fluoroscopy and a hemostat marker, the course of the existing port catheter was identified and marked. A small dermatotomy was then made overlying the course of the catheter. The right internal jugular vein was punctured using a 21 gauge micropuncture needle. A micro wire and micro sheath were advanced into the superior vena cava.  Attention was next turned to the right groin. The right common femoral vein was interrogated with ultrasound and found to be patent. An image was obtained and stored. Local anesthesia was attained by infiltration with 1% lidocaine. A small dermatotomy was made. Under real-time sonographic guidance, the right femoral vein was punctured with a 21 gauge micropuncture needle. With the assistance of a 5 [place] transitional micro sheath, a 0.035 Bentson wire was advanced into the inferior vena cava. The micro sheath was then exchanged for a working 7 [place] vascular sheath.  A pigtail catheter was advanced to the superior vena cava any superior vena cavagram was performed. The pigtail catheter was next advanced into the left innominate vein and a left innominate venogram was performed. The catheter was then advanced into the right innominate vein and a right innominate venogram was performed.  There is high-grade stenosis of the mid SVC encircling the existing port catheter. The bilateral innominate veins remain patent. Numerous small collateral vessels are identified. The stenosis appears to be secondary to external compression from the patient's known mass.  Decision was made to proceed with stenting.  Attention was returned to the neck. The existing port catheter was dissected an  extracted from the right internal jugular vein. This catheter was wrapped in a sterile towel and left externalized for the time being.  The pigtail catheter was removed over a Rosen wire. A 12 x 60 mm Cordis smart stent was selected and position across the region of stenosis. The catheter was successfully deployed and post dilated to 8 mm using an 8 x 40 mm Conquest balloon catheter which was hand inflated.  Attention was again turned to the right neck. Using fluoroscopy an intermittently coughing contrast. The existing right IJ micropuncture sheath was carefully withdrawn until it lay above the SVC stent. Eight fixed J wire was then carefully advanced through the existing stent. A J-wire was maintained centrally within the stent what was viewed in multiple obliquities to confirm that it was indeed passing through the lumen of the stent and not around the stent. The wire was then advanced through the right atrium and into the inferior vena cava. An 8 French peel-away sheath was carefully advanced over the wire under real-time fluoroscopic guidance to ensure that it did not impinge the existing stent. Once the peel-away sheath was in place, the externalized catheter was carefully flushed, clean then advanced to the peel-away sheath. The tip of the catheter is in the mid aspect of the right atrium. This likely a just upward when the patient resumes a more upright position.  The incision was closed with interrupted inverted subdermal Vicryl sutures in the epidermis sealed with Dermabond.  The right groin sheath was pulled and hemostasis attained by gentle manual pressure.  COMPLICATIONS: None  IMPRESSION: 1. Successful stenting of  the superior vena cava stenosis using a 12 x 60 mm Cordis self expanding smart stent. 2. Revision of the existing right IJ approach port catheter. The catheter now courses through the newly placed stent and the tip terminates in the mid to upper right atrium. The portacatheter was flushed and  remains well functioning. Signed,  Criselda Peaches, MD  [facility]  [facility]   Electronically Signed   By: Jacqulynn Cadet M.D.   On: 10/17/2014 19:00    Scheduled Meds: . aspirin  325 mg Oral Q breakfast  . budesonide-formoterol  2 puff Inhalation BID  . docusate sodium  100 mg Oral BID  . enoxaparin (LOVENOX) injection  40 mg Subcutaneous Q24H  . guaiFENesin  1,200 mg Oral BID  . insulin aspart  0-15 Units Subcutaneous TID WC  . ipratropium-albuterol  3 mL Nebulization QID  . levothyroxine  125 mcg Oral QAC breakfast  . multivitamin with minerals  1 tablet Oral q morning - 10a  . pantoprazole  40 mg Oral Q1200  . sertraline  150 mg Oral QHS  . sodium chloride  3 mL Intravenous Q12H  . spironolactone  25 mg Oral q morning - 10a  . varenicline  1 mg Oral BID WC   Continuous Infusions:   Principal Problem:   Chest pain Active Problems:   Primary cancer of right upper lobe of lung   Hypothyroidism   DM type 2 (diabetes mellitus, type 2)   COPD, moderate   SVC syndrome    Time spent: 40 minutes   [facility] Pager [phone]. If 7PM-7AM, please contact night-coverage at [email or web address], password [facility] 11/04/2014, 9:43 AM  LOS: 1 day

## 2014-11-04 NOTE — Progress Notes (Signed)
O2 Sats @ rest 96% O2 Sats post ambulation 96%

## 2014-11-04 NOTE — Progress Notes (Signed)
MICHAELLA IMAI   DOB:24-Jan-1956   ZH#:[id number]   MHD#:[id number]  Patient Care Team: [name], NP as PCP - General (Nurse Practitioner)  Subjective: Janice Ball is a 59 year old woman with a history of recurrent NSCLCa, s/p Nivolumab, s/p SVC stent on 12/31 as she is not a candidate for radiation, admitted on 1/17 with chest pain, intermittent, worse with deep inspiration. Patient is on home oxygen.  She had no fever or chills. No epistaxis noted. She denies any palpitations. She denied any nausea or vomiting. No diarrhea or constipation. She denied any focal or sensory deficits.She denied any headaches, vision changes. She denied any headaches, vision changes.  She denied any bleeding issues.  EKG and Tn were normal. 2 D Echo is pending. A CT angio was negative for PE but enlarging right hilar and mediastinal lung cancer with increased vascular and bronchial encasement, multiple sub cm new and enlarging nodules in both lungs suspicious for early metastases, and progressive right hilar metastatic adenopathy Based on these latest findings were were asked to see the patient in consultation  DIAGNOSIS: Recurrent non-small cell lung cancer, squamous cell carcinoma initially diagnosis stage IIIa (T1 N2 MX ) in August of 2010.   PRIOR THERAPY:  #1 status post concurrent chemoradiation with weekly carboplatin and paclitaxel, last dose of chemotherapy given 08/05/2011.  #2 status post consolidation chemotherapy with carboplatin paclitaxel last dose given 10/27/2009.  #3 Concurrent chemoradiation with weekly carboplatin for an AUC of 2 and paclitaxel at 45 mg per meter squared given concurrent with radiotherapy, last dose was given 09/20/2011.  #4 Systemic chemotherapy with gemcitabine 1000 mg meter squared on days 1 and 8 every 3 weeks status post 3 cycles with stable disease, last dose was given 12/20/2011.  #5 Systemic chemotherapy again with single agent gemcitabine 1000 mg/M2 on days 1 and 8  every 3 weeks. First cycle 11/07/2013. Status post 4 cycles. #6 Systemic chemotherapy again with single agent gemcitabine 1000 mg/M2 on days 1 and 8 every 3 weeks. First dose 07/24/2014. Discontinued on 10/10/14 secondary to intolerance.   CURRENT THERAPY: immunotherapy with Nivolumab 3 MG/KG every 2 weeks. First dose 09/18/2014. She is status post 2 cycles   Scheduled Meds: . aspirin  325 mg Oral Q breakfast  . budesonide-formoterol  2 puff Inhalation BID  . docusate sodium  100 mg Oral BID  . enoxaparin (LOVENOX) injection  40 mg Subcutaneous Q24H  . guaiFENesin  1,200 mg Oral BID  . insulin aspart  0-15 Units Subcutaneous TID WC  . ipratropium-albuterol  3 mL Nebulization QID  . levothyroxine  125 mcg Oral QAC breakfast  . multivitamin with minerals  1 tablet Oral q morning - 10a  . pantoprazole  40 mg Oral Q1200  . sertraline  150 mg Oral QHS  . sodium chloride  3 mL Intravenous Q12H  . spironolactone  25 mg Oral q morning - 10a  . varenicline  1 mg Oral BID WC   Continuous Infusions:  PRN Meds:acetaminophen **OR** acetaminophen, albuterol, LORazepam, morphine injection, ondansetron **OR** ondansetron (ZOFRAN) IV, oxyCODONE, prochlorperazine   Objective:  Filed Vitals:   11/04/14 0450  BP: 159/94  Pulse: 109  Temp: 98.3 F (36.8 C)  Resp: 20      Intake/Output Summary (Last 24 hours) at 11/04/14 0916 Last data filed at 11/04/14 0451  Gross per 24 hour  Intake 1357.5 ml  Output    600 ml  Net  757.5 ml     GENERAL:alert, no distress  and comfortable SKIN: skin color, texture, turgor are normal, no rashes or significant lesions EYES: normal, conjunctiva are pink and non-injected, sclera clear OROPHARYNX:no exudate, no erythema and lips, buccal mucosa, and tongue normal  NECK: no JVD. Left cervical nodes paplpable.  LYMPH:  no palpable lymphadenopathy in the cervical, axillary or inguinal LUNGS: clear to auscultation and percussion with normal breathing  effort HEART: regular rate & rhythm and no murmurs and no lower extremity edema ABDOMEN:abdomen soft, non-tender and normal bowel sounds Musculoskeletal:no cyanosis of digits and no clubbing  PSYCH: alert & oriented x 3 with fluent speech NEURO: no focal motor/sensory deficits    CBG (last 3)   Recent Labs  11/03/14 1659 11/03/14 2122 11/04/14 0721  GLUCAP 83 186* 163*     Labs:   Recent Labs Lab 11/03/14 1222 11/04/14 0435  WBC 7.2 7.0  HGB 12.1 11.6*  HCT 37.4 36.6  PLT 192 215  MCV 83.9 84.3  MCH 27.1 26.7  MCHC 32.4 31.7  RDW 15.5 15.4     Chemistries:    Recent Labs Lab 11/03/14 1222 11/04/14 0435  NA 135 135  K 3.8 3.8  CL 99 100  CO2 22 27  GLUCOSE 188* 173*  BUN 8 8  CREATININE 0.71 0.61  CALCIUM 8.3* 8.2*  AST  --  19  ALT  --  13  ALKPHOS  --  69  BILITOT  --  0.6    GFR Estimated Creatinine Clearance: 79.9 mL/min (by C-G formula based on Cr of 0.61).  Liver Function Tests:  Recent Labs Lab 11/04/14 0435  AST 19  ALT 13  ALKPHOS 69  BILITOT 0.6  PROT 7.0  ALBUMIN 3.4*    Urine Studies     Component Value Date/Time   COLORURINE YELLOW 10/09/2014 1435   APPEARANCEUR CLOUDY* 10/09/2014 1435   LABSPEC 1.005 10/09/2014 1435   PHURINE 6.0 10/09/2014 1435   GLUCOSEU NEGATIVE 10/09/2014 1435   HGBUR NEGATIVE 10/09/2014 1435   BILIRUBINUR NEGATIVE 10/09/2014 1435   KETONESUR NEGATIVE 10/09/2014 1435   PROTEINUR NEGATIVE 10/09/2014 1435   UROBILINOGEN 0.2 10/09/2014 1435   NITRITE NEGATIVE 10/09/2014 1435   LEUKOCYTESUR NEGATIVE 10/09/2014 1435    Coagulation profile No results for input(s): INR, PROTIME in the last 168 hours.  Cardiac Enzymes:  Recent Labs Lab 11/03/14 1627 11/03/14 2148 11/04/14 0435  TROPONINI <0.03 <0.03 <0.03   BNP: Invalid input(s): POCBNP CBG:  Recent Labs Lab 11/03/14 1659 11/03/14 2122 11/04/14 0721  GLUCAP 83 186* 163*   Thyroid function studies  Recent Labs  11/03/14 1626   TSH 11.190*   Microbiology No results found for this or any previous visit (from the past 240 hour(s)).     Imaging Studies:  Ct Angio Chest Pe W/cm &/or Wo Cm  11/03/2014   CLINICAL DATA:  Increased chest pain and SOB s/p recent stent placement. 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 History of COPD and lung cancer (receives chemo).Nurse note: Pt reports chest pain and sob intermittent over last few days. But consistent since last night. Sob worse with exertion. Had ?pulmonary artery stent placed 2 weeks ago. Sts "I'm not sure if the stent is even working, it feels the same as it did before the stent. 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 Denies n/v, dizziness.  EXAM: CT ANGIOGRAPHY CHEST WITH CONTRAST  TECHNIQUE: Multidetector CT imaging of the chest was performed using the standard protocol during bolus administration of intravenous contrast. Multiplanar CT image reconstructions  and MIPs were obtained to evaluate the vascular anatomy.  CONTRAST:  100 cc Omnipaque 350  COMPARISON:  Chest CT dated 09/02/2014.  FINDINGS: The previously demonstrated right hilar mass is larger. The posterior extension previously measured 4.7 x 2.6 cm and currently measures 6.4 x 3.3 cm with increased vascular and bronchial encasement. The previously demonstrated 5.0 x 2.8 cm precarinal mediastinal component currently measures 5.5 x 2.3 cm. By my measurements, this previously measure 5.0 x 2.1 cm and corresponding dimensions.  A previously demonstrated 6 mm short axis right hilar lymph node is increased in size, currently A 10.5 mm short axis on image number 43. No enlarged lymph nodes elsewhere in the chest or upper abdomen.  There is a minimal pericardial effusion with a maximum thickness of 6 mm. There are multiple small, sub cm nodules in both lungs. Some of these are new and some are enlarging. An old, healed right anterolateral rib fracture is again demonstrated. No bone metastases are visualized.   The pulmonary arteries are normally opacified with no pulmonary arterial filling defects seen. Moderate thoracic scoliosis is noted.  Review of the MIP images confirms the above findings.  IMPRESSION: 1.  No pulmonary emboli.  2. Enlarging right hilar and mediastinal lung cancer with increased vascular and bronchial encasement.  3. Multiple sub cm new and enlarging nodules in both lungs. These are suspicious for early metastases.  4.  Progressive right hilar metastatic adenopathy.   Electronically Signed   By: Enrique Sack M.D.   On: 11/03/2014 14:10    Assessment/Plan: 59 y.o.   Non Small Cell Lung Cancer, recurrent This is a very pleasant 59 years old white female with recurrent non-small cell lung cancer, off Nivolumab due to progression of the disease  She is not a radiation candidate unless more symptomatic SCV syndrome were noted She had a SVC stent on 10/17/14 by IR New CT angio on 1/17  Shows enlarging right hilar and mediastinal lung cancer with increased vascular and bronchial encasement, multiple sub cm new and enlarging nodules in both lungs suspicious for early metastases, and progressive right hilar metastatic adenopathy Sr. Jodeci Rini to see the patient today and evaluate options.  Chest pain Likely due to tumor  CT angiogram on 1/17  is negative for pulmonary embolism. Tumor appears to be increasing in size. SCV stent is open 2 D echo pending Troponins were normal Continue PPI/pain meds  Anemia of neoplastic disease Due to recent chemotherapy, malnutrition. No bleeding issues noted No transfusion is indicated at this time Monitor counts closely Transfuse blood to maintain a Hb of 8 g or if the patient is acutely bleeding  Malnutrition Consider Nutrition evaluation  DVT prophylaxis On Lovenox  Full Code   Other medical issues including COPD, DM, tobacco abuse, hypothyroidism as per admitting team     **Disclaimer: This note was dictated with voice recognition  software. Similar sounding words can inadvertently be transcribed and this note may contain transcription errors which may not have been corrected upon publication of note.Sharene Butters E, PA-C 11/04/2014  9:16 AM  ADDENDUM: Hematology/Oncology Attending: The patient is seen and examined. I agree with the above note. This is a very pleasant 59 years old white female with recurrent non-small cell lung cancer, squamous cell carcinoma status post several chemotherapy regimens and most recently treated with immunotherapy with Nivolumab status post 2 cycles. Unfortunately she continues to have evidence for disease progression and the patient presented 3 weeks ago with symptoms of SVC syndrome. She  underwent stent placement by interventional radiology and the swelling in her face and neck had improved. She was admitted with shortness of breath and chest pain. Repeat CT angiogram of the chest showed further evidence for disease progression. The patient is feeling a little bit better today.  I had a lengthy discussion with the patient about her current condition and treatment options. I think her chest pain and shortness of breath are related to her disease progression in the mediastinum and right lung. I will consider starting the patient on systemic chemotherapy with either carboplatin and gemcitabine or single agent docetaxel or Navelbine as soon as she is discharged from the hospital. The patient agreed to the current plan. Thank you so much for taking good care of Mrs. Ponton, I will continue to follow up the patient with you and assist in her management an as-needed basis.

## 2014-11-04 NOTE — Progress Notes (Signed)
UR completed 

## 2014-11-05 ENCOUNTER — Telehealth: Payer: Self-pay | Admitting: *Deleted

## 2014-11-05 LAB — GLUCOSE, CAPILLARY
GLUCOSE-CAPILLARY: 135 mg/dL — AB (ref 70–99)
Glucose-Capillary: 159 mg/dL — ABNORMAL HIGH (ref 70–99)

## 2014-11-05 MED ORDER — PANTOPRAZOLE SODIUM 40 MG PO TBEC: 40.0000 mg | DELAYED_RELEASE_TABLET | Freq: Every day | ORAL | Status: DC

## 2014-11-05 MED ORDER — OXYCODONE HCL 5 MG PO TABS
5.0000 mg | ORAL_TABLET | ORAL | Status: DC | PRN
Start: 2014-11-05 — End: 2014-11-28

## 2014-11-05 MED ORDER — DSS 100 MG PO CAPS: 100.0000 mg | ORAL_CAPSULE | Freq: Two times a day (BID) | ORAL | Status: DC

## 2014-11-05 NOTE — Care Management Note (Signed)
    Page 1 of 1   11/05/2014     11:59:58 AM CARE MANAGEMENT NOTE 11/05/2014  Patient:  Janice Ball, Janice Ball   Account Number:  [id number]  Date Initiated:  11/05/2014  Documentation initiated by:  Dessa Phi  Subjective/Objective Assessment:   59 y/o f admitted w/chest pain.     Action/Plan:   From home.   Anticipated DC Date:  11/05/2014   Anticipated DC Plan:  [place]  CM consult      Choice offered to / List presented to:             Status of service:  Completed, signed off Medicare Important Message given?   (If response is "NO", the following Medicare IM given date fields will be blank) Date Medicare IM given:   Medicare IM given by:   Date Additional Medicare IM given:   Additional Medicare IM given by:    Discharge Disposition:  HOME/SELF CARE  Per UR Regulation:  Reviewed for med. necessity/level of care/duration of stay  If discussed at [place] of Stay Meetings, dates discussed:    Comments:  11/05/14 Dessa Phi RN BSN NCM [phone] d/c home no needs or orders.

## 2014-11-05 NOTE — Discharge Summary (Signed)
Physician Discharge Summary  [name] MRN: [id number] DOB/AGE: [birth year] 59 y.o.  PCP: [name], NP   Admit date: 11/03/2014 Discharge date: 11/05/2014  Discharge Diagnoses:   Non Small Cell Lung Cancer, recurrent   Chest pain Active Problems:   Primary cancer of right upper lobe of lung   Hypothyroidism   DM type 2 (diabetes mellitus, type 2)   COPD, moderate   SVC syndrome  Follow-up recommendations Oncology may start systemic chemotherapy, follow-up with Dr. [name] ASAP Follow-up with PCP in 5-7 days Follow-up CBC, BMP in one week     Medication List    STOP taking these medications        ibuprofen 200 MG tablet  Commonly known as:  ADVIL,MOTRIN     oxyCODONE-acetaminophen 5-325 MG per tablet  Commonly known as:  PERCOCET/ROXICET     PRESCRIPTION MEDICATION      TAKE these medications        acetaminophen 325 MG tablet  Commonly known as:  TYLENOL  Take 650 mg by mouth every 6 (six) hours as needed for mild pain or headache.     alendronate 10 MG tablet  Commonly known as:  FOSAMAX  Take 10 mg by mouth daily.     aspirin 325 MG tablet  Take 325 mg by mouth daily with breakfast.     cholecalciferol 1000 UNITS tablet  Commonly known as:  VITAMIN D  Take 1,000 Units by mouth every morning.     cyanocobalamin 1000 MCG tablet  Take 1,000 mcg by mouth every morning.     DSS 100 MG Caps  Take 100 mg by mouth 2 (two) times daily.     glimepiride 2 MG tablet  Commonly known as:  AMARYL  Take 2 mg by mouth daily with breakfast.     guaiFENesin 600 MG 12 hr tablet  Commonly known as:  MUCINEX  Take 1,200 mg by mouth 2 (two) times daily.     ipratropium 0.02 % nebulizer solution  Commonly known as:  ATROVENT  Take 2.5 mLs (0.5 mg total) by nebulization 4 (four) times daily.     levothyroxine 125 MCG tablet  Commonly known as:  SYNTHROID, LEVOTHROID  Take 125 mcg by mouth daily before breakfast.     LORazepam 1 MG tablet   Commonly known as:  ATIVAN  Take 1 mg by mouth 3 (three) times daily as needed for anxiety.     magnesium gluconate 500 MG tablet  Commonly known as:  MAGONATE  Take 500 mg by mouth every morning.     metFORMIN 1000 MG tablet  Commonly known as:  GLUCOPHAGE  Take 1,000 mg by mouth 2 (two) times daily with a meal.     multivitamin with minerals Tabs tablet  Take 1 tablet by mouth every morning.     OVER THE COUNTER MEDICATION  Take 1 capsule by mouth daily. MSM. For energy and joint pain     oxyCODONE 5 MG immediate release tablet  Commonly known as:  Oxy IR/ROXICODONE  Take 1 tablet (5 mg total) by mouth every 4 (four) hours as needed for moderate pain.     pantoprazole 40 MG tablet  Commonly known as:  PROTONIX  Take 1 tablet (40 mg total) by mouth daily at 12 noon.     albuterol 108 (90 BASE) MCG/ACT inhaler  Commonly known as:  PROVENTIL HFA;VENTOLIN HFA  Inhale 1 puff into the lungs every 6 (six) hours as needed for wheezing  or shortness of breath.     PROAIR HFA 108 (90 BASE) MCG/ACT inhaler  Generic drug:  albuterol  INHALE 2 PUFFS INTO THE LUNGS EVERY 6 (SIX) HOURS AS NEEDED FOR WHEEZING OR SHORTNESS OF BREATH.     prochlorperazine 10 MG tablet  Commonly known as:  COMPAZINE  Take 10 mg by mouth every 6 (six) hours as needed for nausea or vomiting.     sertraline 100 MG tablet  Commonly known as:  ZOLOFT  Take 150 mg by mouth at bedtime.     spironolactone 25 MG tablet  Commonly known as:  ALDACTONE  Take 25 mg by mouth every morning.     budesonide-formoterol 160-4.5 MCG/ACT inhaler  Commonly known as:  SYMBICORT  Inhale 2 puffs into the lungs 2 (two) times daily.     SYMBICORT 160-4.5 MCG/ACT inhaler  Generic drug:  budesonide-formoterol  INHALE 2 PUFFS INTO THE LUNGS 2 (TWO) TIMES DAILY.     varenicline 1 MG tablet  Commonly known as:  CHANTIX  Take 1 mg by mouth 2 (two) times daily.        Discharge Condition: Stable Disposition: 01-Home or  Self Care   Consults: Oncology  Significant Diagnostic Studies: Ct Angio Chest Pe W/cm &/or Wo Cm  11/03/2014   CLINICAL DATA:  Increased chest pain and SOB s/p recent stent placement. 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 History of COPD and lung cancer (receives chemo).Nurse note: Pt reports chest pain and sob intermittent over last few days. But consistent since last night. Sob worse with exertion. Had ?pulmonary artery stent placed 2 weeks ago. Sts "I'm not sure if the stent is even working, it feels the same as it did before the stent. 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 Denies n/v, dizziness.  EXAM: CT ANGIOGRAPHY CHEST WITH CONTRAST  TECHNIQUE: Multidetector CT imaging of the chest was performed using the standard protocol during bolus administration of intravenous contrast. Multiplanar CT image reconstructions and MIPs were obtained to evaluate the vascular anatomy.  CONTRAST:  100 cc Omnipaque 350  COMPARISON:  Chest CT dated 09/02/2014.  FINDINGS: The previously demonstrated right hilar mass is larger. The posterior extension previously measured 4.7 x 2.6 cm and currently measures 6.4 x 3.3 cm with increased vascular and bronchial encasement. The previously demonstrated 5.0 x 2.8 cm precarinal mediastinal component currently measures 5.5 x 2.3 cm. By my measurements, this previously measure 5.0 x 2.1 cm and corresponding dimensions.  A previously demonstrated 6 mm short axis right hilar lymph node is increased in size, currently A 10.5 mm short axis on image number 43. No enlarged lymph nodes elsewhere in the chest or upper abdomen.  There is a minimal pericardial effusion with a maximum thickness of 6 mm. There are multiple small, sub cm nodules in both lungs. Some of these are new and some are enlarging. An old, healed right anterolateral rib fracture is again demonstrated. No bone metastases are visualized.  The pulmonary arteries are normally opacified with no pulmonary  arterial filling defects seen. Moderate thoracic scoliosis is noted.  Review of the MIP images confirms the above findings.  IMPRESSION: 1.  No pulmonary emboli.  2. Enlarging right hilar and mediastinal lung cancer with increased vascular and bronchial encasement.  3. Multiple sub cm new and enlarging nodules in both lungs. These are suspicious for early metastases.  4.  Progressive right hilar metastatic adenopathy.   Electronically Signed   By: Georg Ruddle.D.  On: 11/03/2014 14:10   Ct Angio Chest Pe W/cm &/or Wo Cm  10/09/2014   CLINICAL DATA:  Worsening shortness of breath for 3 days. History of lung cancer and COPD, last chemotherapy treatment October 02, 2014  EXAM: CT ANGIOGRAPHY CHEST WITH CONTRAST  TECHNIQUE: Multidetector CT imaging of the chest was performed using the standard protocol during bolus administration of intravenous contrast. Multiplanar CT image reconstructions and MIPs were obtained to evaluate the vascular anatomy.  CONTRAST:  155m OMNIPAQUE IOHEXOL 350 MG/ML SOLN  COMPARISON:  Chest radiograph October 09, 2014 at 1442 hr and CT of the chest, abdomen and pelvis September 02, 2014  FINDINGS: Adequate pulmonary arterial contrast opacification. Main pulmonary artery is not enlarged. No acute pulmonary embolism to the level of the subsegmental branches. Encasement of the RIGHT upper lobe pulmonary artery due to RIGHT hilar/upper lobe mass, previously measuring 2.6 by 4.7 cm, now approximately 6.7 x 3.6 cm, with effaced RIGHT upper lobe bronchi, worse. Surrounding consolidation, centrilobular nodular densities. No pleural effusions. No pneumothorax.  Heart size is normal. Pericardial thickening without effusion. Thoracic aorta is normal in course and caliber with moderate calcific atherosclerosis. RIGHT hilar lymphadenopathy is confluent from the mass, difficult to discretely identified.  Included view of the abdomen is nonsuspicious. Probable thyroidectomy. Scoliosis without  destructive bony lesions.  Review of the MIP images confirms the above findings.  IMPRESSION: No acute pulmonary embolism.  Increasing size of RIGHT upper lobe mass consistent with disease progression, surrounding centrilobular nodular densities concerning for lymphangitic spread of disease, possible radiation pneumonitis in the proper clinical setting. RIGHT upper lobe bronchi effacement.   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 Her initial severe plethora has responded to steroids, however she continues to experience swelling of her face, neck and shoulders. Additionally, she has numerous new collateral vessels developing across her chest.  She presents today for superior vena cavagram with possible angioplasty versus stenting. Additionally, she has a right IJ portacatheter which she will continue to need. The catheter will likely be revised if a stent is placed.  EXAM: IR ULTRASOUND GUIDANCE VASC ACCESS RIGHT; CENTRAL VENOUS CATHETER; SUPERIOR VENA CAVOGRAM; IR TRANSCATH PLC STENT INITIAL VEIN INC ANGIOPLASTY  Date: 10/17/2014  PROCEDURE: 1. Ultrasound-guided puncture of the right internal jugular vein 2. Ultrasound-guided puncture of the right common femoral vein 3. Catheterization of the superior vena cava with superior vena cavagram 4. Catheterization of the left innominate vein with venogram 5. Catheterization of the right innominate vein with venogram 6. Placement of a 12 x 60 mm self expanding Nitinol stent across the SVC stenosis 7. Revision of existing port catheter. The catheter tip was externalized an Re internalized essentially through the stent and into the upper right atrium. Interventional Radiologist:  HCriselda Peaches MD  ANESTHESIA/SEDATION: Moderate (conscious) sedation  was used. 4 mg Versed, 200 mcg Fentanyl were administered intravenously. The patient's vital signs were monitored continuously by radiology nursing throughout the procedure.  Sedation Time: 75 minutes  MEDICATIONS: 1 g vancomycin administered intravenously  FLUOROSCOPY TIME:  8 min 6 seconds  787 mGy  CONTRAST:  245mOMNIPAQUE IOHEXOL 300 MG/ML  SOLN  TECHNIQUE: Informed consent was obtained from the patient following explanation of the procedure, risks, benefits and alternatives. The patient understands, agrees and consents  for the procedure. All questions were addressed. A time out was performed.  Maximal barrier sterile technique utilized including caps, mask, sterile gowns, sterile gloves, large sterile drape, hand hygiene, and chlorhexidine skin prep.  The right neck was interrogated with ultrasound. The internal jugular vein remains patent. An image was obtained and stored for the medical record. Local anesthesia was attained by infiltration with 1% lidocaine. Using fluoroscopy and a hemostat marker, the course of the existing port catheter was identified and marked. A small dermatotomy was then made overlying the course of the catheter. The right internal jugular vein was punctured using a 21 gauge micropuncture needle. A micro wire and micro sheath were advanced into the superior vena cava.  Attention was next turned to the right groin. The right common femoral vein was interrogated with ultrasound and found to be patent. An image was obtained and stored. Local anesthesia was attained by infiltration with 1% lidocaine. A small dermatotomy was made. Under real-time sonographic guidance, the right femoral vein was punctured with a 21 gauge micropuncture needle. With the assistance of a 5 [place] transitional micro sheath, a 0.035 Bentson wire was advanced into the inferior vena cava. The micro sheath was then exchanged for a working 7 [place] vascular sheath.  A pigtail catheter was advanced to the superior vena  cava any superior vena cavagram was performed. The pigtail catheter was next advanced into the left innominate vein and a left innominate venogram was performed. The catheter was then advanced into the right innominate vein and a right innominate venogram was performed.  There is high-grade stenosis of the mid SVC encircling the existing port catheter. The bilateral innominate veins remain patent. Numerous small collateral vessels are identified. The stenosis appears to be secondary to external compression from the patient's known mass.  Decision was made to proceed with stenting.  Attention was returned to the neck. The existing port catheter was dissected an extracted from the right internal jugular vein. This catheter was wrapped in a sterile towel and left externalized for the time being.  The pigtail catheter was removed over a Rosen wire. A 12 x 60 mm Cordis smart stent was selected and position across the region of stenosis. The catheter was successfully deployed and post dilated to 8 mm using an 8 x 40 mm Conquest balloon catheter which was hand inflated.  Attention was again turned to the right neck. Using fluoroscopy an intermittently coughing contrast. The existing right IJ micropuncture sheath was carefully withdrawn until it lay above the SVC stent. Eight fixed J wire was then carefully advanced through the existing stent. A J-wire was maintained centrally within the stent what was viewed in multiple obliquities to confirm that it was indeed passing through the lumen of the stent and not around the stent. The wire was then advanced through the right atrium and into the inferior vena cava. An 8 French peel-away sheath was carefully advanced over the wire under real-time fluoroscopic guidance to ensure that it did not impinge the existing stent. Once the peel-away sheath was in place, the externalized catheter was carefully flushed, clean then advanced to the peel-away sheath. The tip of the catheter is  in the mid aspect of the right atrium. This likely a just upward when the patient resumes a more upright position.  The incision was closed with interrupted inverted subdermal Vicryl sutures in the epidermis sealed with Dermabond.  The right groin sheath was pulled and hemostasis attained by gentle manual pressure.  COMPLICATIONS: None  IMPRESSION: 1. Successful stenting of the superior vena cava stenosis using a 12 x 60 mm Cordis self expanding smart stent. 2. Revision of the existing right IJ approach port catheter. The catheter now courses through the newly placed stent and the tip terminates in the mid to upper right atrium. The portacatheter was flushed and remains well functioning. Signed,  Criselda Peaches, MD  [facility]  [facility]   Electronically Signed   By: Jacqulynn Cadet M.D.   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 Her initial severe plethora has responded to steroids, however she continues to experience swelling of her face, neck and shoulders. Additionally, she has numerous new collateral vessels developing across her chest.  She presents today for superior vena cavagram with possible angioplasty versus stenting. Additionally, she has a right IJ portacatheter which she will continue to need. The catheter will likely be revised if a stent is placed.  EXAM: IR ULTRASOUND GUIDANCE VASC ACCESS RIGHT; CENTRAL VENOUS CATHETER; SUPERIOR VENA CAVOGRAM; IR TRANSCATH PLC STENT INITIAL VEIN INC ANGIOPLASTY  Date: 10/17/2014  PROCEDURE: 1. Ultrasound-guided puncture of the right internal jugular vein 2. Ultrasound-guided puncture of the right common femoral vein 3. Catheterization of the superior vena cava with superior vena cavagram 4. Catheterization of the left innominate vein with  venogram 5. Catheterization of the right innominate vein with venogram 6. Placement of a 12 x 60 mm self expanding Nitinol stent across the SVC stenosis 7. Revision of existing port catheter. The catheter tip was externalized an Re internalized essentially through the stent and into the upper right atrium. Interventional Radiologist:  Criselda Peaches, MD  ANESTHESIA/SEDATION: Moderate (conscious) sedation was used. 4 mg Versed, 200 mcg Fentanyl were administered intravenously. The patient's vital signs were monitored continuously by radiology nursing throughout the procedure.  Sedation Time: 75 minutes  MEDICATIONS: 1 g vancomycin administered intravenously  FLUOROSCOPY TIME:  8 min 6 seconds  787 mGy  CONTRAST:  28m OMNIPAQUE IOHEXOL 300 MG/ML  SOLN  TECHNIQUE: Informed consent was obtained from the patient following explanation of the procedure, risks, benefits and alternatives. The patient understands, agrees and consents for the procedure. All questions were addressed. A time out was performed.  Maximal barrier sterile technique utilized including caps, mask, sterile gowns, sterile gloves, large sterile drape, hand hygiene, and chlorhexidine skin prep.  The right neck was interrogated with ultrasound. The internal jugular vein remains patent. An image was obtained and stored for the medical record. Local anesthesia was attained by infiltration with 1% lidocaine. Using fluoroscopy and a hemostat marker, the course of the existing port catheter was identified and marked. A small dermatotomy was then made overlying the course of the catheter. The right internal jugular vein was punctured using a 21 gauge micropuncture needle. A micro wire and micro sheath were advanced into the superior vena cava.  Attention was next turned to the right groin. The right common femoral vein was interrogated with ultrasound and found to be patent. An image was obtained and stored. Local anesthesia was attained by infiltration  with 1% lidocaine. A small dermatotomy was made. Under real-time sonographic guidance, the right femoral vein was punctured with a 21 gauge micropuncture needle. With the assistance of a 5 [place]itional micro sheath, a 0.035 Bentson wire was advanced into the inferior  vena cava. The micro sheath was then exchanged for a working 7 [place] vascular sheath.  A pigtail catheter was advanced to the superior vena cava any superior vena cavagram was performed. The pigtail catheter was next advanced into the left innominate vein and a left innominate venogram was performed. The catheter was then advanced into the right innominate vein and a right innominate venogram was performed.  There is high-grade stenosis of the mid SVC encircling the existing port catheter. The bilateral innominate veins remain patent. Numerous small collateral vessels are identified. The stenosis appears to be secondary to external compression from the patient's known mass.  Decision was made to proceed with stenting.  Attention was returned to the neck. The existing port catheter was dissected an extracted from the right internal jugular vein. This catheter was wrapped in a sterile towel and left externalized for the time being.  The pigtail catheter was removed over a Rosen wire. A 12 x 60 mm Cordis smart stent was selected and position across the region of stenosis. The catheter was successfully deployed and post dilated to 8 mm using an 8 x 40 mm Conquest balloon catheter which was hand inflated.  Attention was again turned to the right neck. Using fluoroscopy an intermittently coughing contrast. The existing right IJ micropuncture sheath was carefully withdrawn until it lay above the SVC stent. Eight fixed J wire was then carefully advanced through the existing stent. A J-wire was maintained centrally within the stent what was viewed in multiple obliquities to confirm that it was indeed passing through the lumen of the stent and not around  the stent. The wire was then advanced through the right atrium and into the inferior vena cava. An 8 French peel-away sheath was carefully advanced over the wire under real-time fluoroscopic guidance to ensure that it did not impinge the existing stent. Once the peel-away sheath was in place, the externalized catheter was carefully flushed, clean then advanced to the peel-away sheath. The tip of the catheter is in the mid aspect of the right atrium. This likely a just upward when the patient resumes a more upright position.  The incision was closed with interrupted inverted subdermal Vicryl sutures in the epidermis sealed with Dermabond.  The right groin sheath was pulled and hemostasis attained by gentle manual pressure.  COMPLICATIONS: None  IMPRESSION: 1. Successful stenting of the superior vena cava stenosis using a 12 x 60 mm Cordis self expanding smart stent. 2. Revision of the existing right IJ approach port catheter. The catheter now courses through the newly placed stent and the tip terminates in the mid to upper right atrium. The portacatheter was flushed and remains well functioning. Signed,  Criselda Peaches, MD  [facility]  [facility]   Electronically Signed   By: Jacqulynn Cadet M.D.   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 Her initial severe plethora has responded to steroids, however she continues to experience swelling of her face, neck and shoulders. Additionally, she has numerous new collateral vessels developing across her chest.  She presents today for superior vena cavagram with possible angioplasty versus stenting. Additionally, she has a right IJ portacatheter which she will continue to need. The catheter will likely be revised if a stent is placed.  EXAM:  IR ULTRASOUND GUIDANCE VASC  ACCESS RIGHT; CENTRAL VENOUS CATHETER; SUPERIOR VENA CAVOGRAM; IR TRANSCATH PLC STENT INITIAL VEIN INC ANGIOPLASTY  Date: 10/17/2014  PROCEDURE: 1. Ultrasound-guided puncture of the right internal jugular vein 2. Ultrasound-guided puncture of the right common femoral vein 3. Catheterization of the superior vena cava with superior vena cavagram 4. Catheterization of the left innominate vein with venogram 5. Catheterization of the right innominate vein with venogram 6. Placement of a 12 x 60 mm self expanding Nitinol stent across the SVC stenosis 7. Revision of existing port catheter. The catheter tip was externalized an Re internalized essentially through the stent and into the upper right atrium. Interventional Radiologist:  Criselda Peaches, MD  ANESTHESIA/SEDATION: Moderate (conscious) sedation was used. 4 mg Versed, 200 mcg Fentanyl were administered intravenously. The patient's vital signs were monitored continuously by radiology nursing throughout the procedure.  Sedation Time: 75 minutes  MEDICATIONS: 1 g vancomycin administered intravenously  FLUOROSCOPY TIME:  8 min 6 seconds  787 mGy  CONTRAST:  2m OMNIPAQUE IOHEXOL 300 MG/ML  SOLN  TECHNIQUE: Informed consent was obtained from the patient following explanation of the procedure, risks, benefits and alternatives. The patient understands, agrees and consents for the procedure. All questions were addressed. A time out was performed.  Maximal barrier sterile technique utilized including caps, mask, sterile gowns, sterile gloves, large sterile drape, hand hygiene, and chlorhexidine skin prep.  The right neck was interrogated with ultrasound. The internal jugular vein remains patent. An image was obtained and stored for the medical record. Local anesthesia was attained by infiltration with 1% lidocaine. Using fluoroscopy and a hemostat marker, the course of the existing port catheter was identified and marked. A small dermatotomy  was then made overlying the course of the catheter. The right internal jugular vein was punctured using a 21 gauge micropuncture needle. A micro wire and micro sheath were advanced into the superior vena cava.  Attention was next turned to the right groin. The right common femoral vein was interrogated with ultrasound and found to be patent. An image was obtained and stored. Local anesthesia was attained by infiltration with 1% lidocaine. A small dermatotomy was made. Under real-time sonographic guidance, the right femoral vein was punctured with a 21 gauge micropuncture needle. With the assistance of a 5 FPakistantransitional micro sheath, a 0.035 Bentson wire was advanced into the inferior vena cava. The micro sheath was then exchanged for a working 7 [place] sheath.  A pigtail catheter was advanced to the superior vena cava any superior vena cavagram was performed. The pigtail catheter was next advanced into the left innominate vein and a left innominate venogram was performed. The catheter was then advanced into the right innominate vein and a right innominate venogram was performed.  There is high-grade stenosis of the mid SVC encircling the existing port catheter. The bilateral innominate veins remain patent. Numerous small collateral vessels are identified. The stenosis appears to be secondary to external compression from the patient's known mass.  Decision was made to proceed with stenting.  Attention was returned to the neck. The existing port catheter was dissected an extracted from the right internal jugular vein. This catheter was wrapped in a sterile towel and left externalized for the time being.  The pigtail catheter was removed over a Rosen wire. A 12 x 60 mm Cordis smart stent was selected and position across the region of stenosis. The catheter was successfully deployed and post dilated to 8 mm using an 8 x 40 mm Conquest balloon catheter  which was hand inflated.  Attention was again turned  to the right neck. Using fluoroscopy an intermittently coughing contrast. The existing right IJ micropuncture sheath was carefully withdrawn until it lay above the SVC stent. Eight fixed J wire was then carefully advanced through the existing stent. A J-wire was maintained centrally within the stent what was viewed in multiple obliquities to confirm that it was indeed passing through the lumen of the stent and not around the stent. The wire was then advanced through the right atrium and into the inferior vena cava. An 8 French peel-away sheath was carefully advanced over the wire under real-time fluoroscopic guidance to ensure that it did not impinge the existing stent. Once the peel-away sheath was in place, the externalized catheter was carefully flushed, clean then advanced to the peel-away sheath. The tip of the catheter is in the mid aspect of the right atrium. This likely a just upward when the patient resumes a more upright position.  The incision was closed with interrupted inverted subdermal Vicryl sutures in the epidermis sealed with Dermabond.  The right groin sheath was pulled and hemostasis attained by gentle manual pressure.  COMPLICATIONS: None  IMPRESSION: 1. Successful stenting of the superior vena cava stenosis using a 12 x 60 mm Cordis self expanding smart stent. 2. Revision of the existing right IJ approach port catheter. The catheter now courses through the newly placed stent and the tip terminates in the mid to upper right atrium. The portacatheter was flushed and remains well functioning. Signed,  Criselda Peaches, MD  [facility]  [facility] Radiology   Electronically Signed   By: Jacqulynn Cadet M.D.   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 Her initial severe plethora  has responded to steroids, however she continues to experience swelling of her face, neck and shoulders. Additionally, she has numerous new collateral vessels developing across her chest.  She presents today for superior vena cavagram with possible angioplasty versus stenting. Additionally, she has a right IJ portacatheter which she will continue to need. The catheter will likely be revised if a stent is placed.  EXAM: IR ULTRASOUND GUIDANCE VASC ACCESS RIGHT; CENTRAL VENOUS CATHETER; SUPERIOR VENA CAVOGRAM; IR TRANSCATH PLC STENT INITIAL VEIN INC ANGIOPLASTY  Date: 10/17/2014  PROCEDURE: 1. Ultrasound-guided puncture of the right internal jugular vein 2. Ultrasound-guided puncture of the right common femoral vein 3. Catheterization of the superior vena cava with superior vena cavagram 4. Catheterization of the left innominate vein with venogram 5. Catheterization of the right innominate vein with venogram 6. Placement of a 12 x 60 mm self expanding Nitinol stent across the SVC stenosis 7. Revision of existing port catheter. The catheter tip was externalized an Re internalized essentially through the stent and into the upper right atrium. Interventional Radiologist:  Criselda Peaches, MD  ANESTHESIA/SEDATION: Moderate (conscious) sedation was used. 4 mg Versed, 200 mcg Fentanyl were administered intravenously. The patient's vital signs were monitored continuously by radiology nursing throughout the procedure.  Sedation Time: 75 minutes  MEDICATIONS: 1 g vancomycin administered intravenously  FLUOROSCOPY TIME:  8 min 6 seconds  787 mGy  CONTRAST:  38m OMNIPAQUE IOHEXOL 300 MG/ML  SOLN  TECHNIQUE: Informed consent was obtained from the patient following explanation of the procedure, risks, benefits and alternatives. The patient understands,  agrees and consents for the procedure. All questions were addressed. A time out was performed.  Maximal barrier sterile technique utilized including caps, mask, sterile gowns,  sterile gloves, large sterile drape, hand hygiene, and chlorhexidine skin prep.  The right neck was interrogated with ultrasound. The internal jugular vein remains patent. An image was obtained and stored for the medical record. Local anesthesia was attained by infiltration with 1% lidocaine. Using fluoroscopy and a hemostat marker, the course of the existing port catheter was identified and marked. A small dermatotomy was then made overlying the course of the catheter. The right internal jugular vein was punctured using a 21 gauge micropuncture needle. A micro wire and micro sheath were advanced into the superior vena cava.  Attention was next turned to the right groin. The right common femoral vein was interrogated with ultrasound and found to be patent. An image was obtained and stored. Local anesthesia was attained by infiltration with 1% lidocaine. A small dermatotomy was made. Under real-time sonographic guidance, the right femoral vein was punctured with a 21 gauge micropuncture needle. With the assistance of a 5 [place] transitional micro sheath, a 0.035 Bentson wire was advanced into the inferior vena cava. The micro sheath was then exchanged for a working 7 [place] vascular sheath.  A pigtail catheter was advanced to the superior vena cava any superior vena cavagram was performed. The pigtail catheter was next advanced into the left innominate vein and a left innominate venogram was performed. The catheter was then advanced into the right innominate vein and a right innominate venogram was performed.  There is high-grade stenosis of the mid SVC encircling the existing port catheter. The bilateral innominate veins remain patent. Numerous small collateral vessels are identified. The stenosis appears to be secondary to external compression from the patient's known mass.  Decision was made to proceed with stenting.  Attention was returned to the neck. The existing port catheter was dissected an extracted from  the right internal jugular vein. This catheter was wrapped in a sterile towel and left externalized for the time being.  The pigtail catheter was removed over a Rosen wire. A 12 x 60 mm Cordis smart stent was selected and position across the region of stenosis. The catheter was successfully deployed and post dilated to 8 mm using an 8 x 40 mm Conquest balloon catheter which was hand inflated.  Attention was again turned to the right neck. Using fluoroscopy an intermittently coughing contrast. The existing right IJ micropuncture sheath was carefully withdrawn until it lay above the SVC stent. Eight fixed J wire was then carefully advanced through the existing stent. A J-wire was maintained centrally within the stent what was viewed in multiple obliquities to confirm that it was indeed passing through the lumen of the stent and not around the stent. The wire was then advanced through the right atrium and into the inferior vena cava. An 8 French peel-away sheath was carefully advanced over the wire under real-time fluoroscopic guidance to ensure that it did not impinge the existing stent. Once the peel-away sheath was in place, the externalized catheter was carefully flushed, clean then advanced to the peel-away sheath. The tip of the catheter is in the mid aspect of the right atrium. This likely a just upward when the patient resumes a more upright position.  The incision was closed with interrupted inverted subdermal Vicryl sutures in the epidermis sealed with Dermabond.  The right groin sheath was pulled and hemostasis attained by gentle manual pressure.  COMPLICATIONS: None  IMPRESSION: 1. Successful stenting of the superior vena cava stenosis using a 12 x 60 mm Cordis self expanding smart stent. 2. Revision of the existing right IJ approach port catheter. The catheter now courses through the newly placed stent and the tip terminates in the mid to upper right atrium. The portacatheter was flushed and remains well  functioning. Signed,  Criselda Peaches, MD  [facility]  [facility] Radiology   Electronically Signed   By: Jacqulynn Cadet M.D.   On: 10/17/2014 19:00   Dg Chest Port 1 View  10/09/2014   CLINICAL DATA:  Shortness of breath for 3 days, productive cough, history of lung carcinoma  EXAM: PORTABLE CHEST - 1 VIEW  COMPARISON:  09/02/2014  FINDINGS: Cardiac shadow is within normal limits. A right chest wall port is again seen with the catheter tip at the cavoatrial junction. Left lung remains clear. There are its right suprahilar changes again noted consistent with the patient's given clinical history. No new effusion or focal infiltrate is seen.  IMPRESSION: Stable changes when compared with the prior CT examination consistent with the patient's given clinical history No acute abnormality is noted.   Electronically Signed   By: Inez Catalina M.D.   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 Her initial severe plethora has responded to steroids, however she continues to experience swelling of her face, neck and shoulders. Additionally, she has numerous new collateral vessels developing across her chest.  She presents today for superior vena cavagram with possible angioplasty versus stenting. Additionally, she has a right IJ portacatheter which she will continue to need. The catheter will likely be revised if a stent is placed.  EXAM: IR ULTRASOUND GUIDANCE VASC ACCESS RIGHT; CENTRAL VENOUS CATHETER; SUPERIOR VENA CAVOGRAM; IR TRANSCATH PLC STENT INITIAL VEIN INC ANGIOPLASTY  Date: 10/17/2014  PROCEDURE: 1. Ultrasound-guided puncture of the right internal jugular vein 2. Ultrasound-guided puncture of the right common femoral vein 3. Catheterization of the superior vena cava  with superior vena cavagram 4. Catheterization of the left innominate vein with venogram 5. Catheterization of the right innominate vein with venogram 6. Placement of a 12 x 60 mm self expanding Nitinol stent across the SVC stenosis 7. Revision of existing port catheter. The catheter tip was externalized an Re internalized essentially through the stent and into the upper right atrium. Interventional Radiologist:  Criselda Peaches, MD  ANESTHESIA/SEDATION: Moderate (conscious) sedation was used. 4 mg Versed, 200 mcg Fentanyl were administered intravenously. The patient's vital signs were monitored continuously by radiology nursing throughout the procedure.  Sedation Time: 75 minutes  MEDICATIONS: 1 g vancomycin administered intravenously  FLUOROSCOPY TIME:  8 min 6 seconds  787 mGy  CONTRAST:  67m OMNIPAQUE IOHEXOL 300 MG/ML  SOLN  TECHNIQUE: Informed consent was obtained from the patient following explanation of the procedure, risks, benefits and alternatives. The patient understands, agrees and consents for the procedure. All questions were addressed. A time out was performed.  Maximal barrier sterile technique utilized including caps, mask, sterile gowns, sterile gloves, large sterile drape, hand hygiene, and chlorhexidine skin prep.  The right neck was interrogated with ultrasound. The internal jugular vein remains patent. An image was obtained and stored for the medical record. Local anesthesia was attained by infiltration  with 1% lidocaine. Using fluoroscopy and a hemostat marker, the course of the existing port catheter was identified and marked. A small dermatotomy was then made overlying the course of the catheter. The right internal jugular vein was punctured using a 21 gauge micropuncture needle. A micro wire and micro sheath were advanced into the superior vena cava.  Attention was next turned to the right groin. The right common femoral vein was interrogated with ultrasound and found to be patent. An  image was obtained and stored. Local anesthesia was attained by infiltration with 1% lidocaine. A small dermatotomy was made. Under real-time sonographic guidance, the right femoral vein was punctured with a 21 gauge micropuncture needle. With the assistance of a 5 [place] transitional micro sheath, a 0.035 Bentson wire was advanced into the inferior vena cava. The micro sheath was then exchanged for a working 7 [place] vascular sheath.  A pigtail catheter was advanced to the superior vena cava any superior vena cavagram was performed. The pigtail catheter was next advanced into the left innominate vein and a left innominate venogram was performed. The catheter was then advanced into the right innominate vein and a right innominate venogram was performed.  There is high-grade stenosis of the mid SVC encircling the existing port catheter. The bilateral innominate veins remain patent. Numerous small collateral vessels are identified. The stenosis appears to be secondary to external compression from the patient's known mass.  Decision was made to proceed with stenting.  Attention was returned to the neck. The existing port catheter was dissected an extracted from the right internal jugular vein. This catheter was wrapped in a sterile towel and left externalized for the time being.  The pigtail catheter was removed over a Rosen wire. A 12 x 60 mm Cordis smart stent was selected and position across the region of stenosis. The catheter was successfully deployed and post dilated to 8 mm using an 8 x 40 mm Conquest balloon catheter which was hand inflated.  Attention was again turned to the right neck. Using fluoroscopy an intermittently coughing contrast. The existing right IJ micropuncture sheath was carefully withdrawn until it lay above the SVC stent. Eight fixed J wire was then carefully advanced through the existing stent. A J-wire was maintained centrally within the stent what was viewed in multiple obliquities to  confirm that it was indeed passing through the lumen of the stent and not around the stent. The wire was then advanced through the right atrium and into the inferior vena cava. An 8 French peel-away sheath was carefully advanced over the wire under real-time fluoroscopic guidance to ensure that it did not impinge the existing stent. Once the peel-away sheath was in place, the externalized catheter was carefully flushed, clean then advanced to the peel-away sheath. The tip of the catheter is in the mid aspect of the right atrium. This likely a just upward when the patient resumes a more upright position.  The incision was closed with interrupted inverted subdermal Vicryl sutures in the epidermis sealed with Dermabond.  The right groin sheath was pulled and hemostasis attained by gentle manual pressure.  COMPLICATIONS: None  IMPRESSION: 1. Successful stenting of the superior vena cava stenosis using a 12 x 60 mm Cordis self expanding smart stent. 2. Revision of the existing right IJ approach port catheter. The catheter now courses through the newly placed stent and the tip terminates in the mid to upper right atrium. The portacatheter was flushed and remains well functioning. Signed,  Antonietta Jewel.  Laurence Ferrari, MD  [facility]  [facility] Radiology   Electronically Signed   By: Jacqulynn Cadet M.D.   On: 10/17/2014 19:00       Microbiology: No results found for this or any previous visit (from the past 240 hour(s)).   Labs: Results for orders placed or performed during the hospital encounter of 11/03/14 (from the past 48 hour(s))  CBC     Status: None   Collection Time: 11/03/14 12:22 PM  Result Value Ref Range   WBC 7.2 4.0 - 10.5 K/uL   RBC 4.46 3.87 - 5.11 MIL/uL   Hemoglobin 12.1 12.0 - 15.0 g/dL   HCT 37.4 36.0 - 46.0 %   MCV 83.9 78.0 - 100.0 fL   MCH 27.1 26.0 - 34.0 pg   MCHC 32.4 30.0 - 36.0 g/dL   RDW 15.5 11.5 - 15.5 %   Platelets 192 150 - 400 K/uL   Basic metabolic panel     Status: Abnormal   Collection Time: 11/03/14 12:22 PM  Result Value Ref Range   Sodium 135 135 - 145 mmol/L    Comment: Please note change in reference range.   Potassium 3.8 3.5 - 5.1 mmol/L    Comment: Please note change in reference range.   Chloride 99 96 - 112 mEq/L   CO2 22 19 - 32 mmol/L   Glucose, Bld 188 (H) 70 - 99 mg/dL   BUN 8 6 - 23 mg/dL   Creatinine, Ser 0.71 0.50 - 1.10 mg/dL   Calcium 8.3 (L) 8.4 - 10.5 mg/dL   GFR calc non Af Amer >90 >90 mL/min   GFR calc Af Amer >90 >90 mL/min    Comment: (NOTE) The eGFR has been calculated using the CKD EPI equation. This calculation has not been validated in all clinical situations. eGFR's persistently <90 mL/min signify possible Chronic Kidney Disease.    Anion gap 14 5 - 15  BNP (order ONLY if patient complains of dyspnea/SOB AND you have documented it for THIS visit)     Status: None   Collection Time: 11/03/14 12:22 PM  Result Value Ref Range   B Natriuretic Peptide 22.1 0.0 - 100.0 pg/mL    Comment: Please note change in reference range.  I-stat troponin, ED (not at [facility])     Status: None   Collection Time: 11/03/14 12:30 PM  Result Value Ref Range   Troponin i, poc 0.00 0.00 - 0.08 ng/mL   Comment 3            Comment: Due to the release kinetics of cTnI, a negative result within the first hours of the onset of symptoms does not rule out myocardial infarction with certainty. If myocardial infarction is still suspected, repeat the test at appropriate intervals.   TSH     Status: Abnormal   Collection Time: 11/03/14  4:26 PM  Result Value Ref Range   TSH 11.190 (H) 0.350 - 4.500 uIU/mL    Comment: Performed at [facility]  Troponin I     Status: None   Collection Time: 11/03/14  4:27 PM  Result Value Ref Range   Troponin I <0.03 <0.031 ng/mL    Comment:        NO INDICATION OF MYOCARDIAL INJURY. Please note change in reference range.   T4, free     Status: None    Collection Time: 11/03/14  4:27 PM  Result Value Ref Range   Free T4 0.95 0.80 -  1.80 ng/dL    Comment: Performed at [facility]  Glucose, capillary     Status: None   Collection Time: 11/03/14  4:59 PM  Result Value Ref Range   Glucose-Capillary 83 70 - 99 mg/dL  Glucose, capillary     Status: Abnormal   Collection Time: 11/03/14  9:22 PM  Result Value Ref Range   Glucose-Capillary 186 (H) 70 - 99 mg/dL  Troponin I     Status: None   Collection Time: 11/03/14  9:48 PM  Result Value Ref Range   Troponin I <0.03 <0.031 ng/mL    Comment:        NO INDICATION OF MYOCARDIAL INJURY. Please note change in reference range.   Troponin I     Status: None   Collection Time: 11/04/14  4:35 AM  Result Value Ref Range   Troponin I <0.03 <0.031 ng/mL    Comment:        NO INDICATION OF MYOCARDIAL INJURY. Please note change in reference range.   Comprehensive metabolic panel     Status: Abnormal   Collection Time: 11/04/14  4:35 AM  Result Value Ref Range   Sodium 135 135 - 145 mmol/L    Comment: Please note change in reference range.   Potassium 3.8 3.5 - 5.1 mmol/L    Comment: Please note change in reference range.   Chloride 100 96 - 112 mEq/L   CO2 27 19 - 32 mmol/L   Glucose, Bld 173 (H) 70 - 99 mg/dL   BUN 8 6 - 23 mg/dL   Creatinine, Ser 0.61 0.50 - 1.10 mg/dL   Calcium 8.2 (L) 8.4 - 10.5 mg/dL   Total Protein 7.0 6.0 - 8.3 g/dL   Albumin 3.4 (L) 3.5 - 5.2 g/dL   AST 19 0 - 37 U/L   ALT 13 0 - 35 U/L   Alkaline Phosphatase 69 39 - 117 U/L   Total Bilirubin 0.6 0.3 - 1.2 mg/dL   GFR calc non Af Amer >90 >90 mL/min   GFR calc Af Amer >90 >90 mL/min    Comment: (NOTE) The eGFR has been calculated using the CKD EPI equation. This calculation has not been validated in all clinical situations. eGFR's persistently <90 mL/min signify possible Chronic Kidney Disease.    Anion gap 8 5 - 15  CBC     Status: Abnormal   Collection Time: 11/04/14  4:35 AM  Result Value  Ref Range   WBC 7.0 4.0 - 10.5 K/uL   RBC 4.34 3.87 - 5.11 MIL/uL   Hemoglobin 11.6 (L) 12.0 - 15.0 g/dL   HCT 36.6 36.0 - 46.0 %   MCV 84.3 78.0 - 100.0 fL   MCH 26.7 26.0 - 34.0 pg   MCHC 31.7 30.0 - 36.0 g/dL   RDW 15.4 11.5 - 15.5 %   Platelets 215 150 - 400 K/uL  Glucose, capillary     Status: Abnormal   Collection Time: 11/04/14  7:21 AM  Result Value Ref Range   Glucose-Capillary 163 (H) 70 - 99 mg/dL  Glucose, capillary     Status: Abnormal   Collection Time: 11/04/14 11:36 AM  Result Value Ref Range   Glucose-Capillary 180 (H) 70 - 99 mg/dL  Glucose, capillary     Status: Abnormal   Collection Time: 11/04/14  5:25 PM  Result Value Ref Range   Glucose-Capillary 149 (H) 70 - 99 mg/dL  Glucose, capillary     Status: Abnormal   Collection  Time: 11/04/14  9:43 PM  Result Value Ref Range   Glucose-Capillary 135 (H) 70 - 99 mg/dL  Glucose, capillary     Status: Abnormal   Collection Time: 11/05/14  7:21 AM  Result Value Ref Range   Glucose-Capillary 159 (H) 70 - 99 mg/dL    History of present illness Janice Ball is a 59 year old woman with a history of recurrent NSCLCa, s/p Nivolumab, s/p SVC stent on 12/31 as she is not a candidate for radiation, admitted on 1/17 with chest pain, intermittent, worse with deep inspiration. Patient is on home oxygen. She had no fever or chills. No epistaxis noted. She denies any palpitations. She denied any nausea or vomiting. No diarrhea or constipation. She denied any focal or sensory deficits.She denied any headaches, vision changes. She denied any headaches, vision changes. She denied any bleeding issues. EKG and Tn were normal. 2 D Echo is pending. A CT angio was negative for PE but enlarging right hilar and mediastinal lung cancer with increased vascular and bronchial encasement, multiple sub cm new and enlarging nodules in both lungs suspicious for early metastases, and progressive right hilar metastatic adenopathy Based on these latest  findings , oncology consultation was obtained   Hospital course Non Small Cell Lung Cancer, recurrent This is a very pleasant 59 years old white female with recurrent non-small cell lung cancer, off Nivolumab due to progression of the disease  She is not a radiation candidate unless more symptomatic SCV syndrome were noted She had a SVC stent on 10/17/14 by IR New CT angio on 1/17 Shows enlarging right hilar and mediastinal lung cancer with increased vascular and bronchial encasement, multiple sub cm new and enlarging nodules in both lungs suspicious for early metastases, and progressive right hilar metastatic adenopathy Patient may be a candidate for restarting of chemotherapy, patient to follow-up with oncology Dr. [name] ASAP   Chest pain Likely due to tumor burden CT angiogram on 1/17 is negative for pulmonary embolism. Tumor appears to be increasing in size. 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 No bleeding issues noted No transfusion is indicated at this time Monitor counts closely Transfuse blood to maintain a Hb of 8 g or if the patient is acutely bleeding  Malnutrition Consider Nutrition evaluation  Diabetes mellitus type 2: Resume outpatient regimen  history of COPD: Continue with her nebulizer treatments. Few scattered wheezing heard on examination, but mostly clear. Continue outpatient nebulizer treatments   History of tobacco abuse: She still smokes occasionally. Continue with her Chantix.  Discharge Exam: *  Blood pressure 135/77, pulse 108, temperature 99.2 F (37.3 C), temperature source Oral, resp. rate 19, height 5' 6"  (1.676 m), weight 80.2 kg (176 lb 12.9 oz), SpO2 93 %.   General:  alert & oriented x 3 In NAD   Cardiovascular: RRR, nl S1 s2   Respiratory: Decreased breath sounds at the bases, scattered rhonchi, no crackles   Abdomen: soft +BS NT/ND, no masses palpable   Extremities: No cyanosis and no edema       Discharge Instructions    Diet - low sodium heart healthy    Complete by:  As directed      Increase activity slowly    Complete by:  As  directed            Follow-up Information    Follow up with [facility], NP. Schedule an appointment as soon as possible for a visit in 1 week.   Specialty:  Nurse Practitioner   Contact information:   [place] [address] [phone]       Follow up with Eilleen Kempf., MD. Schedule an appointment as soon as possible for a visit in 3 days.   Specialty:  Oncology   Contact information:   [address] [place] [address] [phone]       Signed: Reyne Dumas 11/05/2014, 10:34 AM

## 2014-11-05 NOTE — Telephone Encounter (Signed)
Call from Janice Ball reporting she was discharged today and Dr. [name] instructed her to call to discuss being Janice Ball dup to chemotherapy.  Scheduled for F/U tomorrow.  Dr. Holley Dexter notified of patient request and current schedule.  Verbal order to keep appointment and he will discuss treatment with her tomorrow.  Patient says she will noy=t come for lab.  "I am bruised enough."  Noted pending cbc-diff and CMET for tomorrow.  These test were drawn 11-04-2013 and results visible.  Will cancel lab appointment as patient indicated she will not come in for lab work.

## 2014-11-06 ENCOUNTER — Encounter: Payer: Self-pay | Admitting: Internal Medicine

## 2014-11-06 ENCOUNTER — Ambulatory Visit (HOSPITAL_BASED_OUTPATIENT_CLINIC_OR_DEPARTMENT_OTHER): Payer: Medicare Other | Admitting: Internal Medicine

## 2014-11-06 ENCOUNTER — Telehealth: Payer: Self-pay | Admitting: Internal Medicine

## 2014-11-06 ENCOUNTER — Other Ambulatory Visit: Payer: Medicare Other

## 2014-11-06 VITALS — BP 152/69 | HR 117 | Temp 97.6°F | Resp 18 | Ht 66.0 in | Wt 178.0 lb

## 2014-11-06 DIAGNOSIS — C3411 Malignant neoplasm of upper lobe, right bronchus or lung: Secondary | ICD-10-CM

## 2014-11-06 MED ORDER — PROCHLORPERAZINE MALEATE 10 MG PO TABS: 10.0000 mg | ORAL_TABLET | Freq: Four times a day (QID) | ORAL | Status: DC | PRN

## 2014-11-06 MED ORDER — LIDOCAINE-PRILOCAINE 2.5-2.5 % EX CREA: 1.0000 "application " | TOPICAL_CREAM | CUTANEOUS | Status: DC | PRN

## 2014-11-06 MED ORDER — METHYLPREDNISOLONE (PAK) 4 MG PO TABS: ORAL_TABLET | ORAL | Status: DC

## 2014-11-06 NOTE — Progress Notes (Signed)
[place] [phone]   [phone]  OFFICE PROGRESS NOTE  [name], NP [place] [address]  DIAGNOSIS: Recurrent non-small cell lung cancer, squamous cell carcinoma initially diagnosis stage IIIa (T1 N2 MX ) in August of 2010.   PRIOR THERAPY:  #1 status post concurrent chemoradiation with weekly carboplatin and paclitaxel, last dose of chemotherapy given 08/05/2011.  #2 status post consolidation chemotherapy with carboplatin paclitaxel last dose given 10/27/2009.  #3 Concurrent chemoradiation with weekly carboplatin for an AUC of 2 and paclitaxel at 45 mg per meter squared given concurrent with radiotherapy, last dose was given 09/20/2011.  #4 Systemic chemotherapy with gemcitabine 1000 mg meter squared on days 1 and 8 every 3 weeks status post 3 cycles with stable disease, last dose was given 12/20/2011.  #5 Systemic chemotherapy again with single agent gemcitabine 1000 mg/M2 on days 1 and 8 every 3 weeks. First cycle 11/07/2013. Status post 4 cycles. #6  Systemic chemotherapy again with single agent gemcitabine 1000 mg/M2 on days 1 and 8 every 3 weeks. First dose 07/24/2014. Discontinued today secondary to intolerance. #7  Immunotherapy with Nivolumab 3 MG/KG every 2 weeks. First dose 09/18/2014. She is status post 2 cycles. Last dose was given 10/02/2014 discontinued secondary to disease progression with significant SVC syndrome.  CURRENT THERAPY: Systemic chemotherapy with carboplatin for AUC of 5 on day 1 and gemcitabine 1000 MG/M2 on days 1 and 8 every 3 weeks. First dose 11/07/2014.  INTERVAL HISTORY: Janice Ball 59 y.o. female returns to the clinic today for hospital follow-up visit. The patient was recently admitted to [facility] complaining of chest pain and shortness of breath. She had cardiac evaluation that was unremarkable. CT angiogram of the chest during her admission showed further  evidence for disease progression. The patient was recently treated with 2 weeks of immunotherapy with Nivolumab but this treatment was discontinued secondary to disease progression and significant SVC syndrome. She was not a candidate for any further radiotherapy. She was seen by interventional radiology and had a stent placed in the SVC which improved the swelling of the face and neck area. She denied having any significant weight loss or night sweats. She denied having any nausea or vomiting. She has no fever or chills. She is here today for evaluation and discussion of her treatment options.  MEDICAL HISTORY: Past Medical History  Diagnosis Date  . Anxiety   . Hyperlipidemia   . Hypothyroidism   . COPD (chronic obstructive pulmonary disease)   . Arthritis     thumbs  . 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 Diabetes mellitus   . Lung cancer dx'd 05/2009  . Lung cancer dx'd 09/2013    recurrence in LN  . 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     ALLERGIES:  is allergic to sulfonamide derivatives; codeine; dilaudid; penicillins; and vicodin.  MEDICATIONS:  Current Outpatient Prescriptions  Medication Sig Dispense Refill  . acetaminophen (TYLENOL) 325 MG tablet Take 650 mg by mouth every 6 (six) hours as needed for mild pain or headache.     . albuterol (PROVENTIL HFA;VENTOLIN HFA) 108 (90 BASE) MCG/ACT inhaler Inhale 1 puff into the lungs every 6 (six) hours as needed for wheezing or shortness of breath.    Marland Kitchen alendronate (FOSAMAX) 10 MG tablet Take 10 mg by mouth daily.     Marland Kitchen aspirin 325 MG tablet Take 325  mg by mouth daily with breakfast.     . budesonide-formoterol (SYMBICORT) 160-4.5 MCG/ACT inhaler Inhale 2 puffs into the lungs 2 (two) times daily.    . cholecalciferol (VITAMIN D) 1000 UNITS tablet Take 1,000 Units by mouth every morning.     . cyanocobalamin 1000 MCG tablet Take 1,000 mcg by mouth every morning.     .  docusate sodium 100 MG CAPS Take 100 mg by mouth 2 (two) times daily. 10 capsule 0  . glimepiride (AMARYL) 2 MG tablet Take 2 mg by mouth daily with breakfast.     . guaiFENesin (MUCINEX) 600 MG 12 hr tablet Take 1,200 mg by mouth 2 (two) times daily.    Marland Kitchen ipratropium (ATROVENT) 0.02 % nebulizer solution Take 2.5 mLs (0.5 mg total) by nebulization 4 (four) times daily. 150 mL 4  . levothyroxine (SYNTHROID, LEVOTHROID) 125 MCG tablet Take 125 mcg by mouth daily before breakfast.     . LORazepam (ATIVAN) 1 MG tablet Take 1 mg by mouth 3 (three) times daily as needed for anxiety.     . magnesium gluconate (MAGONATE) 500 MG tablet Take 500 mg by mouth every morning.     . metFORMIN (GLUCOPHAGE) 1000 MG tablet Take 1,000 mg by mouth 2 (two) times daily with a meal.    . Multiple Vitamin (MULTIVITAMIN WITH MINERALS) TABS tablet Take 1 tablet by mouth every morning.    Marland Kitchen OVER THE COUNTER MEDICATION Take 1 capsule by mouth daily. MSM. For energy and joint pain    . oxyCODONE (OXY IR/ROXICODONE) 5 MG immediate release tablet Take 1 tablet (5 mg total) by mouth every 4 (four) hours as needed for moderate pain. 60 tablet 0  . pantoprazole (PROTONIX) 40 MG tablet Take 1 tablet (40 mg total) by mouth daily at 12 noon. 30 tablet 2  . PROAIR HFA 108 (90 BASE) MCG/ACT inhaler INHALE 2 PUFFS INTO THE LUNGS EVERY 6 (SIX) HOURS AS NEEDED FOR WHEEZING OR SHORTNESS OF BREATH. 8.5 each 2  . prochlorperazine (COMPAZINE) 10 MG tablet Take 10 mg by mouth every 6 (six) hours as needed for nausea or vomiting.    . sertraline (ZOLOFT) 100 MG tablet Take 150 mg by mouth at bedtime.     Marland Kitchen spironolactone (ALDACTONE) 25 MG tablet Take 25 mg by mouth every morning.     . SYMBICORT 160-4.5 MCG/ACT inhaler INHALE 2 PUFFS INTO THE LUNGS 2 (TWO) TIMES DAILY. 10.2 g 2  . varenicline (CHANTIX) 1 MG tablet Take 1 mg by mouth 2 (two) times daily.     No current facility-administered medications for this visit.    SURGICAL HISTORY:    Past Surgical History  Procedure Laterality Date  . Thyroidectomy, partial    . Tonsillectomy    . Tubal ligation    . Partial hysterectomy      REVIEW OF SYSTEMS:  Constitutional: positive for fatigue Eyes: negative Ears, nose, mouth, throat, and face: negative Respiratory: positive for cough and dyspnea on exertion Cardiovascular: negative Gastrointestinal: negative Genitourinary:negative Integument/breast: negative Hematologic/lymphatic: negative Musculoskeletal:negative Neurological: negative Behavioral/Psych: negative Endocrine: negative Allergic/Immunologic: negative   PHYSICAL EXAMINATION: General appearance: alert, cooperative, no distress and Swelling of her face and puffiness of her eye Head: Normocephalic, without obvious abnormality, atraumatic Neck: no adenopathy, no JVD, supple, symmetrical, trachea midline and thyroid not enlarged, symmetric, no tenderness/mass/nodules Lymph nodes: Cervical, supraclavicular, and axillary nodes normal. Resp: wheezes bilaterally Back: symmetric, no curvature. ROM normal. No CVA tenderness. Cardio: regular rate and rhythm, S1,  S2 normal, no murmur, click, rub or gallop GI: soft, non-tender; bowel sounds normal; no masses,  no organomegaly Extremities: extremities normal, atraumatic, no cyanosis or edema Neurologic: Alert and oriented X 3, normal strength and tone. Normal symmetric reflexes. Normal coordination and gait  ECOG PERFORMANCE STATUS: 1 - Symptomatic but completely ambulatory  Blood pressure 152/69, pulse 117, temperature 97.6 F (36.4 C), temperature source Oral, resp. rate 18, height 5\' 6"  (1.676 m), weight 178 lb (80.74 kg), SpO2 96 %.  LABORATORY DATA: Lab Results  Component Value Date   WBC 7.0 11/04/2014   HGB 11.6* 11/04/2014   HCT 36.6 11/04/2014   MCV 84.3 11/04/2014   PLT 215 11/04/2014      Chemistry      Component Value Date/Time   NA 135 11/04/2014 0435   NA 140 10/14/2014 1349   NA 141  04/04/2012 0945   K 3.8 11/04/2014 0435   K 4.1 10/14/2014 1349   K 4.7 04/04/2012 0945   CL 100 11/04/2014 0435   CL 101 04/06/2013 1041   CL 100 04/04/2012 0945   CO2 27 11/04/2014 0435   CO2 28 10/14/2014 1349   CO2 29 04/04/2012 0945   BUN 8 11/04/2014 0435   BUN 14.3 10/14/2014 1349   BUN 16 04/04/2012 0945   CREATININE 0.61 11/04/2014 0435   CREATININE 0.8 10/14/2014 1349   CREATININE 0.9 04/04/2012 0945      Component Value Date/Time   CALCIUM 8.2* 11/04/2014 0435   CALCIUM 8.9 10/14/2014 1349   CALCIUM 8.4 04/04/2012 0945   ALKPHOS 69 11/04/2014 0435   ALKPHOS 79 10/14/2014 1349   ALKPHOS 61 04/04/2012 0945   AST 19 11/04/2014 0435   AST 19 10/14/2014 1349   AST 31 04/04/2012 0945   ALT 13 11/04/2014 0435   ALT 23 10/14/2014 1349   ALT 30 04/04/2012 0945   BILITOT 0.6 11/04/2014 0435   BILITOT 0.26 10/14/2014 1349   BILITOT 0.50 04/04/2012 0945       RADIOGRAPHIC STUDIES: Ct Angio Chest Pe W/cm &/or Wo Cm  11/03/2014   CLINICAL DATA:  Increased chest pain and SOB s/p recent stent placement. 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 History of COPD and lung cancer (receives chemo).Nurse note: Pt reports chest pain and sob intermittent over last few days. But consistent since last night. Sob worse with exertion. Had ?pulmonary artery stent placed 2 weeks ago. Sts "I'm not sure if the stent is even working, it feels the same as it did before the stent. 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 Denies n/v, dizziness.  EXAM: CT ANGIOGRAPHY CHEST WITH CONTRAST  TECHNIQUE: Multidetector CT imaging of the chest was performed using the standard protocol during bolus administration of intravenous contrast. Multiplanar CT image reconstructions and MIPs were obtained to evaluate the vascular anatomy.  CONTRAST:  100 cc Omnipaque 350  COMPARISON:  Chest CT dated 09/02/2014.  FINDINGS: The previously demonstrated right hilar mass is larger. The posterior extension  previously measured 4.7 x 2.6 cm and currently measures 6.4 x 3.3 cm with increased vascular and bronchial encasement. The previously demonstrated 5.0 x 2.8 cm precarinal mediastinal component currently measures 5.5 x 2.3 cm. By my measurements, this previously measure 5.0 x 2.1 cm and corresponding dimensions.  A previously demonstrated 6 mm short axis right hilar lymph node is increased in size, currently A 10.5 mm short axis on image number 43. No enlarged lymph nodes elsewhere in the chest or upper abdomen.  There is a minimal pericardial effusion with a maximum thickness of 6 mm. There are multiple small, sub cm nodules in both lungs. Some of these are new and some are enlarging. An old, healed right anterolateral rib fracture is again demonstrated. No bone metastases are visualized.  The pulmonary arteries are normally opacified with no pulmonary arterial filling defects seen. Moderate thoracic scoliosis is noted.  Review of the MIP images confirms the above findings.  IMPRESSION: 1.  No pulmonary emboli.  2. Enlarging right hilar and mediastinal lung cancer with increased vascular and bronchial encasement.  3. Multiple sub cm new and enlarging nodules in both lungs. These are suspicious for early metastases.  4.  Progressive right hilar metastatic adenopathy.   Electronically Signed   By: Enrique Sack M.D.   On: 11/03/2014 14:10   Ct Angio Chest Pe W/cm &/or Wo Cm  10/09/2014   CLINICAL DATA:  Worsening shortness of breath for 3 days. History of lung cancer and COPD, last chemotherapy treatment October 02, 2014  EXAM: CT ANGIOGRAPHY CHEST WITH CONTRAST  TECHNIQUE: Multidetector CT imaging of the chest was performed using the standard protocol during bolus administration of intravenous contrast. Multiplanar CT image reconstructions and MIPs were obtained to evaluate the vascular anatomy.  CONTRAST:  159mL OMNIPAQUE IOHEXOL 350 MG/ML SOLN  COMPARISON:  Chest radiograph October 09, 2014 at 1442 hr and CT of  the chest, abdomen and pelvis September 02, 2014  FINDINGS: Adequate pulmonary arterial contrast opacification. Main pulmonary artery is not enlarged. No acute pulmonary embolism to the level of the subsegmental branches. Encasement of the RIGHT upper lobe pulmonary artery due to RIGHT hilar/upper lobe mass, previously measuring 2.6 by 4.7 cm, now approximately 6.7 x 3.6 cm, with effaced RIGHT upper lobe bronchi, worse. Surrounding consolidation, centrilobular nodular densities. No pleural effusions. No pneumothorax.  Heart size is normal. Pericardial thickening without effusion. Thoracic aorta is normal in course and caliber with moderate calcific atherosclerosis. RIGHT hilar lymphadenopathy is confluent from the mass, difficult to discretely identified.  Included view of the abdomen is nonsuspicious. Probable thyroidectomy. Scoliosis without destructive bony lesions.  Review of the MIP images confirms the above findings.  IMPRESSION: No acute pulmonary embolism.  Increasing size of RIGHT upper lobe mass consistent with disease progression, surrounding centrilobular nodular densities concerning for lymphangitic spread of disease, possible radiation pneumonitis in the proper clinical setting. RIGHT upper lobe bronchi effacement.   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 Her initial severe plethora has responded to steroids, however she continues to experience swelling of her face, neck and shoulders. Additionally, she has numerous new collateral vessels developing across her chest.  She presents today for superior vena cavagram with possible angioplasty versus stenting. Additionally, she has a right IJ portacatheter which she will continue to need. The catheter will likely be revised if a stent is  placed.  EXAM: IR ULTRASOUND GUIDANCE VASC ACCESS RIGHT; CENTRAL VENOUS CATHETER; SUPERIOR VENA CAVOGRAM; IR TRANSCATH PLC STENT INITIAL VEIN INC ANGIOPLASTY  Date: 10/17/2014  PROCEDURE: 1. Ultrasound-guided puncture of the right internal jugular vein 2. Ultrasound-guided puncture of the right common femoral vein 3. Catheterization of the superior vena cava with superior vena cavagram 4. Catheterization of the left innominate vein with venogram 5. Catheterization of the right  innominate vein with venogram 6. Placement of a 12 x 60 mm self expanding Nitinol stent across the SVC stenosis 7. Revision of existing port catheter. The catheter tip was externalized an Re internalized essentially through the stent and into the upper right atrium. Interventional Radiologist:  Criselda Peaches, MD  ANESTHESIA/SEDATION: Moderate (conscious) sedation was used. 4 mg Versed, 200 mcg Fentanyl were administered intravenously. The patient's vital signs were monitored continuously by radiology nursing throughout the procedure.  Sedation Time: 75 minutes  MEDICATIONS: 1 g vancomycin administered intravenously  FLUOROSCOPY TIME:  8 min 6 seconds  787 mGy  CONTRAST:  4mL OMNIPAQUE IOHEXOL 300 MG/ML  SOLN  TECHNIQUE: Informed consent was obtained from the patient following explanation of the procedure, risks, benefits and alternatives. The patient understands, agrees and consents for the procedure. All questions were addressed. A time out was performed.  Maximal barrier sterile technique utilized including caps, mask, sterile gowns, sterile gloves, large sterile drape, hand hygiene, and chlorhexidine skin prep.  The right neck was interrogated with ultrasound. The internal jugular vein remains patent. An image was obtained and stored for the medical record. Local anesthesia was attained by infiltration with 1% lidocaine. Using fluoroscopy and a hemostat marker, the course of the existing port catheter was identified and marked. A  small dermatotomy was then made overlying the course of the catheter. The right internal jugular vein was punctured using a 21 gauge micropuncture needle. A micro wire and micro sheath were advanced into the superior vena cava.  Attention was next turned to the right groin. The right common femoral vein was interrogated with ultrasound and found to be patent. An image was obtained and stored. Local anesthesia was attained by infiltration with 1% lidocaine. A small dermatotomy was made. Under real-time sonographic guidance, the right femoral vein was punctured with a 21 gauge micropuncture needle. With the assistance of a 5 [place] transitional micro sheath, a 0.035 Bentson wire was advanced into the inferior vena cava. The micro sheath was then exchanged for a working 7 [place] vascular sheath.  A pigtail catheter was advanced to the superior vena cava any superior vena cavagram was performed. The pigtail catheter was next advanced into the left innominate vein and a left innominate venogram was performed. The catheter was then advanced into the right innominate vein and a right innominate venogram was performed.  There is high-grade stenosis of the mid SVC encircling the existing port catheter. The bilateral innominate veins remain patent. Numerous small collateral vessels are identified. The stenosis appears to be secondary to external compression from the patient's known mass.  Decision was made to proceed with stenting.  Attention was returned to the neck. The existing port catheter was dissected an extracted from the right internal jugular vein. This catheter was wrapped in a sterile towel and left externalized for the time being.  The pigtail catheter was removed over a Rosen wire. A 12 x 60 mm Cordis smart stent was selected and position across the region of stenosis. The catheter was successfully deployed and post dilated to 8 mm using an 8 x 40 mm Conquest balloon catheter which was hand inflated.  Attention  was again turned to the right neck. Using fluoroscopy an intermittently coughing contrast. The existing right IJ micropuncture sheath was carefully withdrawn until it lay above the SVC stent. Eight fixed J wire was then carefully advanced through the existing stent. A J-wire was maintained centrally within the stent what was viewed in multiple obliquities to confirm  that it was indeed passing through the lumen of the stent and not around the stent. The wire was then advanced through the right atrium and into the inferior vena cava. An 8 French peel-away sheath was carefully advanced over the wire under real-time fluoroscopic guidance to ensure that it did not impinge the existing stent. Once the peel-away sheath was in place, the externalized catheter was carefully flushed, clean then advanced to the peel-away sheath. The tip of the catheter is in the mid aspect of the right atrium. This likely a just upward when the patient resumes a more upright position.  The incision was closed with interrupted inverted subdermal Vicryl sutures in the epidermis sealed with Dermabond.  The right groin sheath was pulled and hemostasis attained by gentle manual pressure.  COMPLICATIONS: None  IMPRESSION: 1. Successful stenting of the superior vena cava stenosis using a 12 x 60 mm Cordis self expanding smart stent. 2. Revision of the existing right IJ approach port catheter. The catheter now courses through the newly placed stent and the tip terminates in the mid to upper right atrium. The portacatheter was flushed and remains well functioning. Signed,  Criselda Peaches, MD  [facility]  [facility]   Electronically Signed   By: Jacqulynn Cadet M.D.   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 Her initial severe  plethora has responded to steroids, however she continues to experience swelling of her face, neck and shoulders. Additionally, she has numerous new collateral vessels developing across her chest.  She presents today for superior vena cavagram with possible angioplasty versus stenting. Additionally, she has a right IJ portacatheter which she will continue to need. The catheter will likely be revised if a stent is placed.  EXAM: IR ULTRASOUND GUIDANCE VASC ACCESS RIGHT; CENTRAL VENOUS CATHETER; SUPERIOR VENA CAVOGRAM; IR TRANSCATH PLC STENT INITIAL VEIN INC ANGIOPLASTY  Date: 10/17/2014  PROCEDURE: 1. Ultrasound-guided puncture of the right internal jugular vein 2. Ultrasound-guided puncture of the right common femoral vein 3. Catheterization of the superior vena cava with superior vena cavagram 4. Catheterization of the left innominate vein with venogram 5. Catheterization of the right innominate vein with venogram 6. Placement of a 12 x 60 mm self expanding Nitinol stent across the SVC stenosis 7. Revision of existing port catheter. The catheter tip was externalized an Re internalized essentially through the stent and into the upper right atrium. Interventional Radiologist:  Criselda Peaches, MD  ANESTHESIA/SEDATION: Moderate (conscious) sedation was used. 4 mg Versed, 200 mcg Fentanyl were administered intravenously. The patient's vital signs were monitored continuously by radiology nursing throughout the procedure.  Sedation Time: 75 minutes  MEDICATIONS: 1 g vancomycin administered intravenously  FLUOROSCOPY TIME:  8 min 6 seconds  787 mGy  CONTRAST:  32mL OMNIPAQUE IOHEXOL 300 MG/ML  SOLN  TECHNIQUE: Informed consent was obtained from the patient following explanation of the procedure, risks, benefits and alternatives. The patient understands, agrees and consents for the procedure. All questions were addressed. A time out was performed.  Maximal barrier sterile technique utilized including caps, mask,  sterile gowns, sterile gloves, large sterile drape, hand hygiene, and chlorhexidine skin prep.  The right neck was interrogated with ultrasound. The internal jugular vein remains patent. An image was obtained and stored for the medical record. Local anesthesia was attained  by infiltration with 1% lidocaine. Using fluoroscopy and a hemostat marker, the course of the existing port catheter was identified and marked. A small dermatotomy was then made overlying the course of the catheter. The right internal jugular vein was punctured using a 21 gauge micropuncture needle. A micro wire and micro sheath were advanced into the superior vena cava.  Attention was next turned to the right groin. The right common femoral vein was interrogated with ultrasound and found to be patent. An image was obtained and stored. Local anesthesia was attained by infiltration with 1% lidocaine. A small dermatotomy was made. Under real-time sonographic guidance, the right femoral vein was punctured with a 21 gauge micropuncture needle. With the assistance of a 5 [place] transitional micro sheath, a 0.035 Bentson wire was advanced into the inferior vena cava. The micro sheath was then exchanged for a working 7 [place] vascular sheath.  A pigtail catheter was advanced to the superior vena cava any superior vena cavagram was performed. The pigtail catheter was next advanced into the left innominate vein and a left innominate venogram was performed. The catheter was then advanced into the right innominate vein and a right innominate venogram was performed.  There is high-grade stenosis of the mid SVC encircling the existing port catheter. The bilateral innominate veins remain patent. Numerous small collateral vessels are identified. The stenosis appears to be secondary to external compression from the patient's known mass.  Decision was made to proceed with stenting.  Attention was returned to the neck. The existing port catheter was dissected an  extracted from the right internal jugular vein. This catheter was wrapped in a sterile towel and left externalized for the time being.  The pigtail catheter was removed over a Rosen wire. A 12 x 60 mm Cordis smart stent was selected and position across the region of stenosis. The catheter was successfully deployed and post dilated to 8 mm using an 8 x 40 mm Conquest balloon catheter which was hand inflated.  Attention was again turned to the right neck. Using fluoroscopy an intermittently coughing contrast. The existing right IJ micropuncture sheath was carefully withdrawn until it lay above the SVC stent. Eight fixed J wire was then carefully advanced through the existing stent. A J-wire was maintained centrally within the stent what was viewed in multiple obliquities to confirm that it was indeed passing through the lumen of the stent and not around the stent. The wire was then advanced through the right atrium and into the inferior vena cava. An 8 French peel-away sheath was carefully advanced over the wire under real-time fluoroscopic guidance to ensure that it did not impinge the existing stent. Once the peel-away sheath was in place, the externalized catheter was carefully flushed, clean then advanced to the peel-away sheath. The tip of the catheter is in the mid aspect of the right atrium. This likely a just upward when the patient resumes a more upright position.  The incision was closed with interrupted inverted subdermal Vicryl sutures in the epidermis sealed with Dermabond.  The right groin sheath was pulled and hemostasis attained by gentle manual pressure.  COMPLICATIONS: None  IMPRESSION: 1. Successful stenting of the superior vena cava stenosis using a 12 x 60 mm Cordis self expanding smart stent. 2. Revision of the existing right IJ approach port catheter. The catheter now courses through the newly placed stent and the tip terminates in the mid to upper right atrium. The portacatheter was flushed and  remains well functioning. Signed,  Criselda Peaches, MD  [facility]  [facility] Radiology   Electronically Signed   By: Jacqulynn Cadet M.D.   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 Her initial severe plethora has responded to steroids, however she continues to experience swelling of her face, neck and shoulders. Additionally, she has numerous new collateral vessels developing across her chest.  She presents today for superior vena cavagram with possible angioplasty versus stenting. Additionally, she has a right IJ portacatheter which she will continue to need. The catheter will likely be revised if a stent is placed.  EXAM: IR ULTRASOUND GUIDANCE VASC ACCESS RIGHT; CENTRAL VENOUS CATHETER; SUPERIOR VENA CAVOGRAM; IR TRANSCATH PLC STENT INITIAL VEIN INC ANGIOPLASTY  Date: 10/17/2014  PROCEDURE: 1. Ultrasound-guided puncture of the right internal jugular vein 2. Ultrasound-guided puncture of the right common femoral vein 3. Catheterization of the superior vena cava with superior vena cavagram 4. Catheterization of the left innominate vein with venogram 5. Catheterization of the right innominate vein with venogram 6. Placement of a 12 x 60 mm self expanding Nitinol stent across the SVC stenosis 7. Revision of existing port catheter. The catheter tip was externalized an Re internalized essentially through the stent and into the upper right atrium. Interventional Radiologist:  Criselda Peaches, MD  ANESTHESIA/SEDATION: Moderate (conscious) sedation was used. 4 mg Versed, 200 mcg Fentanyl were administered intravenously. The patient's vital signs were monitored continuously by radiology nursing throughout the procedure.  Sedation Time: 75 minutes  MEDICATIONS: 1 g vancomycin administered  intravenously  FLUOROSCOPY TIME:  8 min 6 seconds  787 mGy  CONTRAST:  53mL OMNIPAQUE IOHEXOL 300 MG/ML  SOLN  TECHNIQUE: Informed consent was obtained from the patient following explanation of the procedure, risks, benefits and alternatives. The patient understands, agrees and consents for the procedure. All questions were addressed. A time out was performed.  Maximal barrier sterile technique utilized including caps, mask, sterile gowns, sterile gloves, large sterile drape, hand hygiene, and chlorhexidine skin prep.  The right neck was interrogated with ultrasound. The internal jugular vein remains patent. An image was obtained and stored for the medical record. Local anesthesia was attained by infiltration with 1% lidocaine. Using fluoroscopy and a hemostat marker, the course of the existing port catheter was identified and marked. A small dermatotomy was then made overlying the course of the catheter. The right internal jugular vein was punctured using a 21 gauge micropuncture needle. A micro wire and micro sheath were advanced into the superior vena cava.  Attention was next turned to the right groin. The right common femoral vein was interrogated with ultrasound and found to be patent. An image was obtained and stored. Local anesthesia was attained by infiltration with 1% lidocaine. A small dermatotomy was made. Under real-time sonographic guidance, the right femoral vein was punctured with a 21 gauge micropuncture needle. With the assistance of a 5 [place] transitional micro sheath, a 0.035 Bentson wire was advanced into the inferior vena cava. The micro sheath was then exchanged for a working 7 [place] vascular sheath.  A pigtail catheter was advanced to the superior vena cava any superior vena cavagram was performed. The pigtail catheter was next advanced into the left innominate vein and a left innominate venogram was performed. The catheter was then advanced into the right innominate vein  and a right  innominate venogram was performed.  There is high-grade stenosis of the mid SVC encircling the existing port catheter. The bilateral innominate veins remain patent. Numerous small collateral vessels are identified. The stenosis appears to be secondary to external compression from the patient's known mass.  Decision was made to proceed with stenting.  Attention was returned to the neck. The existing port catheter was dissected an extracted from the right internal jugular vein. This catheter was wrapped in a sterile towel and left externalized for the time being.  The pigtail catheter was removed over a Rosen wire. A 12 x 60 mm Cordis smart stent was selected and position across the region of stenosis. The catheter was successfully deployed and post dilated to 8 mm using an 8 x 40 mm Conquest balloon catheter which was hand inflated.  Attention was again turned to the right neck. Using fluoroscopy an intermittently coughing contrast. The existing right IJ micropuncture sheath was carefully withdrawn until it lay above the SVC stent. Eight fixed J wire was then carefully advanced through the existing stent. A J-wire was maintained centrally within the stent what was viewed in multiple obliquities to confirm that it was indeed passing through the lumen of the stent and not around the stent. The wire was then advanced through the right atrium and into the inferior vena cava. An 8 French peel-away sheath was carefully advanced over the wire under real-time fluoroscopic guidance to ensure that it did not impinge the existing stent. Once the peel-away sheath was in place, the externalized catheter was carefully flushed, clean then advanced to the peel-away sheath. The tip of the catheter is in the mid aspect of the right atrium. This likely a just upward when the patient resumes a more upright position.  The incision was closed with interrupted inverted subdermal Vicryl sutures in the epidermis sealed with Dermabond.  The  right groin sheath was pulled and hemostasis attained by gentle manual pressure.  COMPLICATIONS: None  IMPRESSION: 1. Successful stenting of the superior vena cava stenosis using a 12 x 60 mm Cordis self expanding smart stent. 2. Revision of the existing right IJ approach port catheter. The catheter now courses through the newly placed stent and the tip terminates in the mid to upper right atrium. The portacatheter was flushed and remains well functioning. Signed,  Criselda Peaches, MD  [facility]  [facility] Radiology   Electronically Signed   By: Jacqulynn Cadet M.D.   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 Her initial severe plethora has responded to steroids, however she continues to experience swelling of her face, neck and shoulders. Additionally, she has numerous new collateral vessels developing across her chest.  She presents today for superior vena cavagram with possible angioplasty versus stenting. Additionally, she has a right IJ portacatheter which she will continue to need. The catheter will likely be revised if a stent is placed.  EXAM: IR ULTRASOUND GUIDANCE VASC ACCESS RIGHT; CENTRAL VENOUS CATHETER; SUPERIOR VENA CAVOGRAM; IR TRANSCATH PLC STENT INITIAL VEIN INC ANGIOPLASTY  Date: 10/17/2014  PROCEDURE: 1. Ultrasound-guided puncture of the right internal jugular vein 2. Ultrasound-guided puncture of the right common femoral vein 3. Catheterization of the superior vena cava with superior vena cavagram 4. Catheterization of the left innominate vein with venogram 5.  Catheterization of the right innominate vein with venogram 6. Placement of a 12 x 60 mm self expanding Nitinol stent across the SVC stenosis 7. Revision of existing port catheter. The catheter tip was externalized  an Re internalized essentially through the stent and into the upper right atrium. Interventional Radiologist:  Criselda Peaches, MD  ANESTHESIA/SEDATION: Moderate (conscious) sedation was used. 4 mg Versed, 200 mcg Fentanyl were administered intravenously. The patient's vital signs were monitored continuously by radiology nursing throughout the procedure.  Sedation Time: 75 minutes  MEDICATIONS: 1 g vancomycin administered intravenously  FLUOROSCOPY TIME:  8 min 6 seconds  787 mGy  CONTRAST:  60mL OMNIPAQUE IOHEXOL 300 MG/ML  SOLN  TECHNIQUE: Informed consent was obtained from the patient following explanation of the procedure, risks, benefits and alternatives. The patient understands, agrees and consents for the procedure. All questions were addressed. A time out was performed.  Maximal barrier sterile technique utilized including caps, mask, sterile gowns, sterile gloves, large sterile drape, hand hygiene, and chlorhexidine skin prep.  The right neck was interrogated with ultrasound. The internal jugular vein remains patent. An image was obtained and stored for the medical record. Local anesthesia was attained by infiltration with 1% lidocaine. Using fluoroscopy and a hemostat marker, the course of the existing port catheter was identified and marked. A small dermatotomy was then made overlying the course of the catheter. The right internal jugular vein was punctured using a 21 gauge micropuncture needle. A micro wire and micro sheath were advanced into the superior vena cava.  Attention was next turned to the right groin. The right common femoral vein was interrogated with ultrasound and found to be patent. An image was obtained and stored. Local anesthesia was attained by infiltration with 1% lidocaine. A small dermatotomy was made. Under real-time sonographic guidance, the right femoral vein was punctured with a 21 gauge micropuncture needle. With the assistance of a 5 [place] transitional micro sheath, a  0.035 Bentson wire was advanced into the inferior vena cava. The micro sheath was then exchanged for a working 7 [place] vascular sheath.  A pigtail catheter was advanced to the superior vena cava any superior vena cavagram was performed. The pigtail catheter was next advanced into the left innominate vein and a left innominate venogram was performed. The catheter was then advanced into the right innominate vein and a right innominate venogram was performed.  There is high-grade stenosis of the mid SVC encircling the existing port catheter. The bilateral innominate veins remain patent. Numerous small collateral vessels are identified. The stenosis appears to be secondary to external compression from the patient's known mass.  Decision was made to proceed with stenting.  Attention was returned to the neck. The existing port catheter was dissected an extracted from the right internal jugular vein. This catheter was wrapped in a sterile towel and left externalized for the time being.  The pigtail catheter was removed over a Rosen wire. A 12 x 60 mm Cordis smart stent was selected and position across the region of stenosis. The catheter was successfully deployed and post dilated to 8 mm using an 8 x 40 mm Conquest balloon catheter which was hand inflated.  Attention was again turned to the right neck. Using fluoroscopy an intermittently coughing contrast. The existing right IJ micropuncture sheath was carefully withdrawn until it lay above the SVC stent. Eight fixed J wire was then carefully advanced through the existing stent. A J-wire was maintained centrally within the stent what was viewed in  multiple obliquities to confirm that it was indeed passing through the lumen of the stent and not around the stent. The wire was then advanced through the right atrium and into the inferior vena cava. An 8 French peel-away sheath was carefully advanced over the wire under real-time fluoroscopic guidance to ensure that it did not  impinge the existing stent. Once the peel-away sheath was in place, the externalized catheter was carefully flushed, clean then advanced to the peel-away sheath. The tip of the catheter is in the mid aspect of the right atrium. This likely a just upward when the patient resumes a more upright position.  The incision was closed with interrupted inverted subdermal Vicryl sutures in the epidermis sealed with Dermabond.  The right groin sheath was pulled and hemostasis attained by gentle manual pressure.  COMPLICATIONS: None  IMPRESSION: 1. Successful stenting of the superior vena cava stenosis using a 12 x 60 mm Cordis self expanding smart stent. 2. Revision of the existing right IJ approach port catheter. The catheter now courses through the newly placed stent and the tip terminates in the mid to upper right atrium. The portacatheter was flushed and remains well functioning. Signed,  Criselda Peaches, MD  [facility]  [facility] Radiology   Electronically Signed   By: Jacqulynn Cadet M.D.   On: 10/17/2014 19:00   Dg Chest Port 1 View  10/09/2014   CLINICAL DATA:  Shortness of breath for 3 days, productive cough, history of lung carcinoma  EXAM: PORTABLE CHEST - 1 VIEW  COMPARISON:  09/02/2014  FINDINGS: Cardiac shadow is within normal limits. A right chest wall port is again seen with the catheter tip at the cavoatrial junction. Left lung remains clear. There are its right suprahilar changes again noted consistent with the patient's given clinical history. No new effusion or focal infiltrate is seen.  IMPRESSION: Stable changes when compared with the prior CT examination consistent with the patient's given clinical history No acute abnormality is noted.   Electronically Signed   By: Inez Catalina M.D.   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 Her initial severe plethora has responded to steroids, however she continues to experience swelling of her face, neck and shoulders. Additionally, she has numerous new collateral vessels developing across her chest.  She presents today for superior vena cavagram with possible angioplasty versus stenting. Additionally, she has a right IJ portacatheter which she will continue to need. The catheter will likely be revised if a stent is placed.  EXAM: IR ULTRASOUND GUIDANCE VASC ACCESS RIGHT; CENTRAL VENOUS CATHETER; SUPERIOR VENA CAVOGRAM; IR TRANSCATH PLC STENT INITIAL VEIN INC ANGIOPLASTY  Date: 10/17/2014  PROCEDURE: 1. Ultrasound-guided puncture of the right internal jugular vein 2. Ultrasound-guided puncture of the right common femoral vein 3. Catheterization of the superior vena cava with superior vena cavagram 4. Catheterization of the left innominate vein with venogram 5. Catheterization of the right innominate vein with venogram 6. Placement of a 12 x 60 mm self expanding Nitinol stent across the SVC stenosis 7. Revision of existing port catheter. The catheter tip was externalized an Re internalized essentially through the stent and into the upper right atrium. Interventional Radiologist:  Criselda Peaches, MD  ANESTHESIA/SEDATION: Moderate (conscious) sedation was used. 4 mg Versed, 200  mcg Fentanyl were administered intravenously. The patient's vital signs were monitored continuously by radiology nursing throughout the procedure.  Sedation Time: 75 minutes  MEDICATIONS: 1 g vancomycin administered intravenously  FLUOROSCOPY TIME:  8 min 6 seconds  787 mGy  CONTRAST:  80mL OMNIPAQUE IOHEXOL 300 MG/ML  SOLN  TECHNIQUE: Informed consent was obtained from the patient following explanation of the procedure, risks, benefits and alternatives. The patient understands, agrees and consents for the procedure. All questions were addressed. A  time out was performed.  Maximal barrier sterile technique utilized including caps, mask, sterile gowns, sterile gloves, large sterile drape, hand hygiene, and chlorhexidine skin prep.  The right neck was interrogated with ultrasound. The internal jugular vein remains patent. An image was obtained and stored for the medical record. Local anesthesia was attained by infiltration with 1% lidocaine. Using fluoroscopy and a hemostat marker, the course of the existing port catheter was identified and marked. A small dermatotomy was then made overlying the course of the catheter. The right internal jugular vein was punctured using a 21 gauge micropuncture needle. A micro wire and micro sheath were advanced into the superior vena cava.  Attention was next turned to the right groin. The right common femoral vein was interrogated with ultrasound and found to be patent. An image was obtained and stored. Local anesthesia was attained by infiltration with 1% lidocaine. A small dermatotomy was made. Under real-time sonographic guidance, the right femoral vein was punctured with a 21 gauge micropuncture needle. With the assistance of a 5 [place] transitional micro sheath, a 0.035 Bentson wire was advanced into the inferior vena cava. The micro sheath was then exchanged for a working 7 [place] vascular sheath.  A pigtail catheter was advanced to the superior vena cava any superior vena cavagram was performed. The pigtail catheter was next advanced into the left innominate vein and a left innominate venogram was performed. The catheter was then advanced into the right innominate vein and a right innominate venogram was performed.  There is high-grade stenosis of the mid SVC encircling the existing port catheter. The bilateral innominate veins remain patent. Numerous small collateral vessels are identified. The stenosis appears to be secondary to external compression from the patient's known mass.  Decision was made to proceed with  stenting.  Attention was returned to the neck. The existing port catheter was dissected an extracted from the right internal jugular vein. This catheter was wrapped in a sterile towel and left externalized for the time being.  The pigtail catheter was removed over a Rosen wire. A 12 x 60 mm Cordis smart stent was selected and position across the region of stenosis. The catheter was successfully deployed and post dilated to 8 mm using an 8 x 40 mm Conquest balloon catheter which was hand inflated.  Attention was again turned to the right neck. Using fluoroscopy an intermittently coughing contrast. The existing right IJ micropuncture sheath was carefully withdrawn until it lay above the SVC stent. Eight fixed J wire was then carefully advanced through the existing stent. A J-wire was maintained centrally within the stent what was viewed in multiple obliquities to confirm that it was indeed passing through the lumen of the stent and not around the stent. The wire was then advanced through the right atrium and into the inferior vena cava. An 8 French peel-away sheath was carefully advanced over the wire under real-time fluoroscopic guidance to ensure that it did not impinge the existing stent. Once the peel-away sheath was in  place, the externalized catheter was carefully flushed, clean then advanced to the peel-away sheath. The tip of the catheter is in the mid aspect of the right atrium. This likely a just upward when the patient resumes a more upright position.  The incision was closed with interrupted inverted subdermal Vicryl sutures in the epidermis sealed with Dermabond.  The right groin sheath was pulled and hemostasis attained by gentle manual pressure.  COMPLICATIONS: None  IMPRESSION: 1. Successful stenting of the superior vena cava stenosis using a 12 x 60 mm Cordis self expanding smart stent. 2. Revision of the existing right IJ approach port catheter. The catheter now courses through the newly placed stent  and the tip terminates in the mid to upper right atrium. The portacatheter was flushed and remains well functioning. Signed,  Criselda Peaches, MD  [facility]  [facility] Radiology   Electronically Signed   By: Jacqulynn Cadet M.D.   On: 10/17/2014 19:00   ASSESSMENT AND PLAN: This is a very pleasant 59 years old white female with recurrent non-small cell lung cancer status post concurrent chemoradiation as well as consolidation chemotherapy and she is currently on systemic chemotherapy with single agent gemcitabine status post 4 cycles.  She was treated again with a course of systemic chemotherapy with single agent gemcitabine for 3 cycles but the patient has rough time tolerating her treatment with significant fatigue and weakness. The recent CT scan of the chest, abdomen and pelvis showed stable disease but the patient continues to have the persistent right upper lobe area of mass fibrosis and consolidation as well as persistent right hilar, subcarinal and right paratracheal lymphadenopathy as well as, one area of masslike soft tissue immediately anterior to the carina. She was started on treatment with Nivolumab status post 2 cycles. She tolerated the treatment well but she presented with evidence for disease progression in the chest. This treatment was discontinued. 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 Also the most recent CT angiogram of the chest showed further disease progression. I had a lengthy discussion with the patient today about her current disease status and treatment options. I gave the patient the option of palliative care and hospice referral versus consideration of systemic chemotherapy with carboplatin for AUC of 5 on day 1 and gemcitabine 1000 MG/M2 on days 1 and 8 every 3 weeks. She is interested in chemotherapy. I discussed with her the adverse effect of this treatment including but not  limited to mild alopecia, nausea and vomiting, myelosuppression, peripheral neuropathy, liver or renal dysfunction. The patient would like to proceed with the treatment as soon as possible. She is expected to start the first cycle of this treatment tomorrow. She would come back for follow-up visit in 3 weeks with the next cycle of her treatment. I started her on Medrol Dosepak for the shortness of breath and lack of appetite. She was advised to call immediately if she has any concerning symptoms in the interval.  All questions were answered. The patient knows to call the clinic with any problems, questions or concerns. We can certainly see the patient much sooner if necessary.   Disclaimer: This note was dictated with voice recognition software. Similar sounding words can inadvertently be transcribed and may not be corrected upon review.

## 2014-11-06 NOTE — Telephone Encounter (Signed)
Gave avs & calendar for January/Febraury. Sent message to schedule treatment.

## 2014-11-07 ENCOUNTER — Ambulatory Visit (HOSPITAL_BASED_OUTPATIENT_CLINIC_OR_DEPARTMENT_OTHER): Payer: Medicare Other

## 2014-11-07 ENCOUNTER — Telehealth: Payer: Self-pay | Admitting: *Deleted

## 2014-11-07 ENCOUNTER — Other Ambulatory Visit (HOSPITAL_BASED_OUTPATIENT_CLINIC_OR_DEPARTMENT_OTHER): Payer: Medicare Other

## 2014-11-07 DIAGNOSIS — C3491 Malignant neoplasm of unspecified part of right bronchus or lung: Secondary | ICD-10-CM

## 2014-11-07 DIAGNOSIS — C3411 Malignant neoplasm of upper lobe, right bronchus or lung: Secondary | ICD-10-CM

## 2014-11-07 DIAGNOSIS — Z5111 Encounter for antineoplastic chemotherapy: Secondary | ICD-10-CM

## 2014-11-07 LAB — CBC WITH DIFFERENTIAL/PLATELET
BASO%: 1.4 % (ref 0.0–2.0)
Basophils Absolute: 0.1 10*3/uL (ref 0.0–0.1)
EOS ABS: 0.2 10*3/uL (ref 0.0–0.5)
EOS%: 2.5 % (ref 0.0–7.0)
HCT: 39.4 % (ref 34.8–46.6)
HGB: 12.3 g/dL (ref 11.6–15.9)
LYMPH%: 12.6 % — ABNORMAL LOW (ref 14.0–49.7)
MCH: 25.8 pg (ref 25.1–34.0)
MCHC: 31.3 g/dL — ABNORMAL LOW (ref 31.5–36.0)
MCV: 82.5 fL (ref 79.5–101.0)
MONO#: 0.5 10*3/uL (ref 0.1–0.9)
MONO%: 6.9 % (ref 0.0–14.0)
NEUT#: 5.8 10*3/uL (ref 1.5–6.5)
NEUT%: 76.6 % (ref 38.4–76.8)
Platelets: 240 10*3/uL (ref 145–400)
RBC: 4.78 10*6/uL (ref 3.70–5.45)
RDW: 16 % — ABNORMAL HIGH (ref 11.2–14.5)
WBC: 7.6 10*3/uL (ref 3.9–10.3)
lymph#: 1 10*3/uL (ref 0.9–3.3)

## 2014-11-07 LAB — COMPREHENSIVE METABOLIC PANEL (CC13)
ALK PHOS: 77 U/L (ref 40–150)
ALT: 13 U/L (ref 0–55)
AST: 24 U/L (ref 5–34)
Albumin: 3.4 g/dL — ABNORMAL LOW (ref 3.5–5.0)
Anion Gap: 11 mEq/L (ref 3–11)
BUN: 8 mg/dL (ref 7.0–26.0)
CO2: 23 mEq/L (ref 22–29)
Calcium: 8.1 mg/dL — ABNORMAL LOW (ref 8.4–10.4)
Chloride: 100 mEq/L (ref 98–109)
Creatinine: 0.7 mg/dL (ref 0.6–1.1)
EGFR: >90 mL/min/{1.73_m2} (ref >90)
Glucose: 264 mg/dl — ABNORMAL HIGH (ref 70–140)
POTASSIUM: 4.4 meq/L (ref 3.5–5.1)
Sodium: 135 mEq/L — ABNORMAL LOW (ref 136–145)
TOTAL PROTEIN: 7.3 g/dL (ref 6.4–8.3)
Total Bilirubin: 0.27 mg/dL (ref 0.20–1.20)

## 2014-11-07 MED ORDER — SODIUM CHLORIDE 0.9 % IJ SOLN
10.0000 mL | INTRAMUSCULAR | Status: DC | PRN
  Administered 2014-11-07: 10 mL
  Filled 2014-11-07: qty 10

## 2014-11-07 MED ORDER — CARBOPLATIN CHEMO INTRADERMAL TEST DOSE 100MCG/0.02ML
100.0000 ug | Freq: Once | INTRADERMAL | Status: AC
  Administered 2014-11-07: 100 ug via INTRADERMAL
  Filled 2014-11-07: qty 0.01

## 2014-11-07 MED ORDER — SODIUM CHLORIDE 0.9 % IV SOLN
Freq: Once | INTRAVENOUS | Status: AC
  Administered 2014-11-07: 13:00:00 via INTRAVENOUS

## 2014-11-07 MED ORDER — GEMCITABINE HCL CHEMO INJECTION 1 GM/26.3ML
1000.0000 mg/m2 | Freq: Once | INTRAVENOUS | Status: AC
  Administered 2014-11-07: 1938 mg via INTRAVENOUS
  Filled 2014-11-07: qty 50.97

## 2014-11-07 MED ORDER — DEXAMETHASONE SODIUM PHOSPHATE 20 MG/5ML IJ SOLN
20.0000 mg | Freq: Once | INTRAMUSCULAR | Status: AC
  Administered 2014-11-07: 20 mg via INTRAVENOUS

## 2014-11-07 MED ORDER — DEXAMETHASONE SODIUM PHOSPHATE 20 MG/5ML IJ SOLN
INTRAMUSCULAR | Status: AC
  Filled 2014-11-07: qty 5

## 2014-11-07 MED ORDER — SODIUM CHLORIDE 0.9 % IV SOLN
601.5000 mg | Freq: Once | INTRAVENOUS | Status: AC
  Administered 2014-11-07: 600 mg via INTRAVENOUS
  Filled 2014-11-07: qty 60

## 2014-11-07 MED ORDER — ONDANSETRON 16 MG/50ML IVPB (CHCC)
INTRAVENOUS | Status: AC
Start: 2014-11-07 — End: 2014-11-07
  Filled 2014-11-07: qty 16

## 2014-11-07 MED ORDER — ONDANSETRON 16 MG/50ML IVPB (CHCC)
16.0000 mg | Freq: Once | INTRAVENOUS | Status: AC
  Administered 2014-11-07: 16 mg via INTRAVENOUS

## 2014-11-07 MED ORDER — HEPARIN SOD (PORK) LOCK FLUSH 100 UNIT/ML IV SOLN
500.0000 [IU] | Freq: Once | INTRAVENOUS | Status: AC | PRN
  Administered 2014-11-07: 500 [IU]
  Filled 2014-11-07: qty 5

## 2014-11-07 NOTE — Patient Instructions (Signed)
[facility] Discharge Instructions for Patients Receiving Chemotherapy  Today you received the following chemotherapy agents: Gemzar and Carboplatin.  To help prevent nausea and vomiting after your treatment, we encourage you to take your nausea medication:  Compazine 10 mg every 6 hours as needed.   If you develop nausea and vomiting that is not controlled by your nausea medication, call the clinic.   BELOW ARE SYMPTOMS THAT SHOULD BE REPORTED IMMEDIATELY:  *FEVER GREATER THAN 100.5 F  *CHILLS WITH OR WITHOUT FEVER  NAUSEA AND VOMITING THAT IS NOT CONTROLLED WITH YOUR NAUSEA MEDICATION  *UNUSUAL SHORTNESS OF BREATH  *UNUSUAL BRUISING OR BLEEDING  TENDERNESS IN MOUTH AND THROAT WITH OR WITHOUT PRESENCE OF ULCERS  *URINARY PROBLEMS  *BOWEL PROBLEMS  UNUSUAL RASH Items with * indicate a potential emergency and should be followed up as soon as possible.  Feel free to call the clinic you have any questions or concerns. The clinic phone number is [phone].

## 2014-11-07 NOTE — Telephone Encounter (Signed)
Per staff message and POF I have scheduled appts. Advised scheduler of appts. JMW  

## 2014-11-08 ENCOUNTER — Ambulatory Visit: Payer: Medicare Other

## 2014-11-14 ENCOUNTER — Ambulatory Visit (HOSPITAL_BASED_OUTPATIENT_CLINIC_OR_DEPARTMENT_OTHER): Payer: Medicare Other

## 2014-11-14 ENCOUNTER — Other Ambulatory Visit: Payer: Self-pay | Admitting: Medical Oncology

## 2014-11-14 ENCOUNTER — Other Ambulatory Visit (HOSPITAL_BASED_OUTPATIENT_CLINIC_OR_DEPARTMENT_OTHER): Payer: Medicare Other

## 2014-11-14 DIAGNOSIS — R739 Hyperglycemia, unspecified: Secondary | ICD-10-CM

## 2014-11-14 DIAGNOSIS — R7309 Other abnormal glucose: Secondary | ICD-10-CM

## 2014-11-14 DIAGNOSIS — C3411 Malignant neoplasm of upper lobe, right bronchus or lung: Secondary | ICD-10-CM

## 2014-11-14 DIAGNOSIS — Z5111 Encounter for antineoplastic chemotherapy: Secondary | ICD-10-CM

## 2014-11-14 DIAGNOSIS — C3491 Malignant neoplasm of unspecified part of right bronchus or lung: Secondary | ICD-10-CM

## 2014-11-14 LAB — COMPREHENSIVE METABOLIC PANEL (CC13)
ALBUMIN: 3.3 g/dL — AB (ref 3.5–5.0)
ALK PHOS: 75 U/L (ref 40–150)
ALT: 21 U/L (ref 0–55)
ANION GAP: 11 meq/L (ref 3–11)
AST: 23 U/L (ref 5–34)
BUN: 9.6 mg/dL (ref 7.0–26.0)
CO2: 20 meq/L — AB (ref 22–29)
CREATININE: 0.8 mg/dL (ref 0.6–1.1)
Calcium: 7.9 mg/dL — ABNORMAL LOW (ref 8.4–10.4)
Chloride: 103 mEq/L (ref 98–109)
EGFR: 79 mL/min/{1.73_m2} — AB (ref >90)
Glucose: 407 mg/dl — ABNORMAL HIGH (ref 70–140)
Potassium: 3.8 mEq/L (ref 3.5–5.1)
Sodium: 134 mEq/L — ABNORMAL LOW (ref 136–145)
TOTAL PROTEIN: 6.8 g/dL (ref 6.4–8.3)
Total Bilirubin: 0.33 mg/dL (ref 0.20–1.20)

## 2014-11-14 LAB — CBC WITH DIFFERENTIAL/PLATELET
BASO%: 2 % (ref 0.0–2.0)
Basophils Absolute: 0.1 10*3/uL (ref 0.0–0.1)
EOS ABS: 0 10*3/uL (ref 0.0–0.5)
EOS%: 1.3 % (ref 0.0–7.0)
HEMATOCRIT: 37.1 % (ref 34.8–46.6)
HGB: 11.6 g/dL (ref 11.6–15.9)
LYMPH#: 0.7 10*3/uL — AB (ref 0.9–3.3)
LYMPH%: 21.4 % (ref 14.0–49.7)
MCH: 26 pg (ref 25.1–34.0)
MCHC: 31.2 g/dL — ABNORMAL LOW (ref 31.5–36.0)
MCV: 83.1 fL (ref 79.5–101.0)
MONO#: 0.1 10*3/uL (ref 0.1–0.9)
MONO%: 2.8 % (ref 0.0–14.0)
NEUT#: 2.3 10*3/uL (ref 1.5–6.5)
NEUT%: 72.5 % (ref 38.4–76.8)
Platelets: 118 10*3/uL — ABNORMAL LOW (ref 145–400)
RBC: 4.46 10*6/uL (ref 3.70–5.45)
RDW: 15.5 % — AB (ref 11.2–14.5)
WBC: 3.2 10*3/uL — AB (ref 3.9–10.3)

## 2014-11-14 MED ORDER — SODIUM CHLORIDE 0.9 % IV SOLN
1000.0000 mg/m2 | Freq: Once | INTRAVENOUS | Status: AC
  Administered 2014-11-14: 1938 mg via INTRAVENOUS
  Filled 2014-11-14: qty 50.97

## 2014-11-14 MED ORDER — PROCHLORPERAZINE MALEATE 10 MG PO TABS
ORAL_TABLET | ORAL | Status: AC
  Filled 2014-11-14: qty 1

## 2014-11-14 MED ORDER — HEPARIN SOD (PORK) LOCK FLUSH 100 UNIT/ML IV SOLN
500.0000 [IU] | Freq: Once | INTRAVENOUS | Status: AC | PRN
  Administered 2014-11-14: 500 [IU]
  Filled 2014-11-14: qty 5

## 2014-11-14 MED ORDER — INSULIN REGULAR HUMAN 100 UNIT/ML IJ SOLN
20.0000 [IU] | INTRAMUSCULAR | Status: DC
  Administered 2014-11-14: 20 [IU] via SUBCUTANEOUS
  Filled 2014-11-14: qty 0.2

## 2014-11-14 MED ORDER — SODIUM CHLORIDE 0.9 % IV SOLN
Freq: Once | INTRAVENOUS | Status: AC
  Administered 2014-11-14: 15:00:00 via INTRAVENOUS

## 2014-11-14 MED ORDER — PROCHLORPERAZINE MALEATE 10 MG PO TABS
10.0000 mg | ORAL_TABLET | Freq: Once | ORAL | Status: AC
  Administered 2014-11-14: 10 mg via ORAL

## 2014-11-14 MED ORDER — SODIUM CHLORIDE 0.9 % IJ SOLN
10.0000 mL | INTRAMUSCULAR | Status: DC | PRN
  Administered 2014-11-14: 10 mL
  Filled 2014-11-14: qty 10

## 2014-11-14 NOTE — Progress Notes (Signed)
[id number].  Dr. Earlie Server notified and order received to give pt 20 units of Humulin R insulin SQ now.  Pt to continue to monitor glucose at home per MD orders.

## 2014-11-14 NOTE — Progress Notes (Signed)
Per Dr [name] give pt insulin regular 20 units now for blood glucose

## 2014-11-14 NOTE — Patient Instructions (Signed)
[facility] Discharge Instructions for Patients Receiving Chemotherapy  Today you received the following chemotherapy agents: Gemzar   To help prevent nausea and vomiting after your treatment, we encourage you to take your nausea medication:  Compazine 10 mg every 6 hours as needed.   If you develop nausea and vomiting that is not controlled by your nausea medication, call the clinic.   BELOW ARE SYMPTOMS THAT SHOULD BE REPORTED IMMEDIATELY:  *FEVER GREATER THAN 100.5 F  *CHILLS WITH OR WITHOUT FEVER  NAUSEA AND VOMITING THAT IS NOT CONTROLLED WITH YOUR NAUSEA MEDICATION  *UNUSUAL SHORTNESS OF BREATH  *UNUSUAL BRUISING OR BLEEDING  TENDERNESS IN MOUTH AND THROAT WITH OR WITHOUT PRESENCE OF ULCERS  *URINARY PROBLEMS  *BOWEL PROBLEMS  UNUSUAL RASH Items with * indicate a potential emergency and should be followed up as soon as possible.  Feel free to call the clinic you have any questions or concerns. The clinic phone number is [phone].

## 2014-11-19 ENCOUNTER — Telehealth: Payer: Self-pay | Admitting: Medical Oncology

## 2014-11-19 ENCOUNTER — Telehealth: Payer: Self-pay | Admitting: *Deleted

## 2014-11-19 NOTE — Telephone Encounter (Signed)
states she is coughing up blood about every hour, " dime size now" , was nickel sized- Color is " like color of period blood clot". Her oxygen sats ar 95% on room air. She reports she is feeling weak today.Per [name] I instructed pt to go to ED if she is coughing up fresh blood or having worsening SOB. She voiced understanding.

## 2014-11-19 NOTE — Telephone Encounter (Signed)
Call received in [place] from pt stating "I just coughed up a blood clot."  Per inquiry by this RN pt states:  " I just felt like I needed to cough and up came this clot in the tissue"  "About the size of a nickel "  Shape was " flat ".  Pt can cough now ( per request by this RN while on the phone ) with no production of blood or sputum.  Pt has no other complaints other then above concern.  This RN discussed above not unusual for her known diagnosis and current chemotherapy regimen.  Informed pt  this note will be sent to MD for review and if further concerns she would be contacted.  Recommended to pt to note above on her calender and call if above occurs again.  Return call number for pt given as [phone].  THIS NOTE WILL BE SENT TO MD AND RN AT DESK.

## 2014-11-21 ENCOUNTER — Inpatient Hospital Stay (HOSPITAL_COMMUNITY): Admit: 2014-11-21 | Payer: Self-pay

## 2014-11-21 ENCOUNTER — Other Ambulatory Visit: Payer: Self-pay | Admitting: Nurse Practitioner

## 2014-11-21 ENCOUNTER — Ambulatory Visit (HOSPITAL_COMMUNITY)
Admission: RE | Admit: 2014-11-21 | Discharge: 2014-11-21 | Disposition: A | Discharge status: Home / Self Care | Payer: Medicare Other | Source: Ambulatory Visit | Attending: Internal Medicine | Admitting: Internal Medicine

## 2014-11-21 ENCOUNTER — Ambulatory Visit (HOSPITAL_BASED_OUTPATIENT_CLINIC_OR_DEPARTMENT_OTHER): Payer: Medicare Other

## 2014-11-21 ENCOUNTER — Ambulatory Visit (HOSPITAL_BASED_OUTPATIENT_CLINIC_OR_DEPARTMENT_OTHER): Payer: Medicare Other | Admitting: Nurse Practitioner

## 2014-11-21 ENCOUNTER — Other Ambulatory Visit (HOSPITAL_BASED_OUTPATIENT_CLINIC_OR_DEPARTMENT_OTHER): Payer: Medicare Other

## 2014-11-21 VITALS — BP 124/62 | HR 107 | Temp 98.2°F | Resp 18

## 2014-11-21 DIAGNOSIS — R042 Hemoptysis: Secondary | ICD-10-CM

## 2014-11-21 DIAGNOSIS — D61810 Antineoplastic chemotherapy induced pancytopenia: Secondary | ICD-10-CM

## 2014-11-21 DIAGNOSIS — T451X5A Adverse effect of antineoplastic and immunosuppressive drugs, initial encounter: Secondary | ICD-10-CM

## 2014-11-21 DIAGNOSIS — C3411 Malignant neoplasm of upper lobe, right bronchus or lung: Secondary | ICD-10-CM

## 2014-11-21 DIAGNOSIS — E039 Hypothyroidism, unspecified: Secondary | ICD-10-CM

## 2014-11-21 DIAGNOSIS — Z79899 Other long term (current) drug therapy: Secondary | ICD-10-CM

## 2014-11-21 DIAGNOSIS — D61818 Other pancytopenia: Secondary | ICD-10-CM

## 2014-11-21 DIAGNOSIS — C3481 Malignant neoplasm of overlapping sites of right bronchus and lung: Secondary | ICD-10-CM

## 2014-11-21 LAB — CBC WITH DIFFERENTIAL/PLATELET
BASO%: 0 % (ref 0.0–2.0)
Basophils Absolute: 0 10*3/uL (ref 0.0–0.1)
EOS ABS: 0 10*3/uL (ref 0.0–0.5)
EOS%: 1.8 % (ref 0.0–7.0)
HCT: 31.2 % — ABNORMAL LOW (ref 34.8–46.6)
HGB: 10.1 g/dL — ABNORMAL LOW (ref 11.6–15.9)
LYMPH%: 64.3 % — AB (ref 14.0–49.7)
MCH: 26.3 pg (ref 25.1–34.0)
MCHC: 32.4 g/dL (ref 31.5–36.0)
MCV: 81.3 fL (ref 79.5–101.0)
MONO#: 0 10*3/uL — ABNORMAL LOW (ref 0.1–0.9)
MONO%: 1.8 % (ref 0.0–14.0)
NEUT#: 0.2 10*3/uL — CL (ref 1.5–6.5)
NEUT%: 32.1 % — ABNORMAL LOW (ref 38.4–76.8)
NRBC: 0 % (ref 0–0)
PLATELETS: 6 10*3/uL — AB (ref 145–400)
RBC: 3.84 10*6/uL (ref 3.70–5.45)
RDW: 13.9 % (ref 11.2–14.5)
WBC: 0.6 10*3/uL — CL (ref 3.9–10.3)
lymph#: 0.4 10*3/uL — ABNORMAL LOW (ref 0.9–3.3)

## 2014-11-21 LAB — COMPREHENSIVE METABOLIC PANEL (CC13)
ALK PHOS: 73 U/L (ref 40–150)
ALT: 25 U/L (ref 0–55)
AST: 23 U/L (ref 5–34)
Albumin: 3.3 g/dL — ABNORMAL LOW (ref 3.5–5.0)
Anion Gap: 14 mEq/L — ABNORMAL HIGH (ref 3–11)
BUN: 8.1 mg/dL (ref 7.0–26.0)
CHLORIDE: 100 meq/L (ref 98–109)
CO2: 24 meq/L (ref 22–29)
CREATININE: 0.8 mg/dL (ref 0.6–1.1)
Calcium: 8.1 mg/dL — ABNORMAL LOW (ref 8.4–10.4)
EGFR: 82 mL/min/{1.73_m2} — ABNORMAL LOW (ref >90)
GLUCOSE: 309 mg/dL — AB (ref 70–140)
Potassium: 3.9 mEq/L (ref 3.5–5.1)
SODIUM: 138 meq/L (ref 136–145)
TOTAL PROTEIN: 6.7 g/dL (ref 6.4–8.3)
Total Bilirubin: 0.25 mg/dL (ref 0.20–1.20)

## 2014-11-21 LAB — ABO/RH: ABO/RH(D): A NEG

## 2014-11-21 LAB — TSH CHCC: TSH: 4.196 m(IU)/L — ABNORMAL HIGH (ref 0.308–3.960)

## 2014-11-21 MED ORDER — ACETAMINOPHEN 325 MG PO TABS
650.0000 mg | ORAL_TABLET | Freq: Once | ORAL | Status: AC
  Administered 2014-11-21: 650 mg via ORAL

## 2014-11-21 MED ORDER — DIPHENHYDRAMINE HCL 25 MG PO CAPS
25.0000 mg | ORAL_CAPSULE | Freq: Once | ORAL | Status: AC
  Administered 2014-11-21: 25 mg via ORAL

## 2014-11-21 MED ORDER — TBO-FILGRASTIM 480 MCG/0.8ML ~~LOC~~ SOSY
480.0000 ug | PREFILLED_SYRINGE | Freq: Once | SUBCUTANEOUS | Status: DC
  Filled 2014-11-21: qty 0.8

## 2014-11-21 MED ORDER — SODIUM CHLORIDE 0.9 % IV SOLN
250.0000 mL | Freq: Once | INTRAVENOUS | Status: AC
  Administered 2014-11-21: 250 mL via INTRAVENOUS

## 2014-11-21 MED ORDER — HEPARIN SOD (PORK) LOCK FLUSH 100 UNIT/ML IV SOLN
500.0000 [IU] | Freq: Every day | INTRAVENOUS | Status: AC | PRN
  Administered 2014-11-21: 500 [IU]
  Filled 2014-11-21: qty 5

## 2014-11-21 MED ORDER — TBO-FILGRASTIM 480 MCG/0.8ML ~~LOC~~ SOSY
480.0000 ug | PREFILLED_SYRINGE | Freq: Once | SUBCUTANEOUS | Status: AC
  Administered 2014-11-21: 480 ug via SUBCUTANEOUS
  Filled 2014-11-21: qty 0.8

## 2014-11-21 MED ORDER — ACETAMINOPHEN 325 MG PO TABS
ORAL_TABLET | ORAL | Status: AC
  Filled 2014-11-21: qty 2

## 2014-11-21 MED ORDER — SODIUM CHLORIDE 0.9 % IJ SOLN
10.0000 mL | INTRAMUSCULAR | Status: AC | PRN
  Administered 2014-11-21: 10 mL
  Filled 2014-11-21: qty 10

## 2014-11-21 MED ORDER — DIPHENHYDRAMINE HCL 25 MG PO CAPS
ORAL_CAPSULE | ORAL | Status: AC
Start: 2014-11-21 — End: 2014-11-21
  Filled 2014-11-21: qty 2

## 2014-11-21 NOTE — Patient Instructions (Signed)
Filgrastim, G-CSF injection What is this medicine? FILGRASTIM, G-CSF (fil GRA stim) is a granulocyte colony-stimulating factor that stimulates the growth of neutrophils, a type of white blood cell (WBC) important in the body's fight against infection. It is used to reduce the incidence of fever and infection in patients with certain types of cancer who are receiving chemotherapy that affects the bone marrow, to stimulate blood cell production for removal of WBCs from the body prior to a bone marrow transplantation, to reduce the incidence of fever and infection in patients who have severe chronic neutropenia, and to improve survival outcomes following high-dose radiation exposure that is toxic to the bone marrow. This medicine may be used for other purposes; ask your health care provider or pharmacist if you have questions. COMMON BRAND NAME(S): Neupogen What should I tell my health care provider before I take this medicine? They need to know if you have any of these conditions: -latex allergy -ongoing radiation therapy -sickle cell disease -an unusual or allergic reaction to filgrastim, pegfilgrastim, other medicines, foods, dyes, or preservatives -pregnant or trying to get pregnant -breast-feeding How should I use this medicine? This medicine is for injection under the skin. If you get this medicine at home, you will be taught how to prepare and give this medicine. Refer to the Instructions for Use that come with your medication packaging. Use exactly as directed. Take your medicine at regular intervals. Do not take your medicine more often than directed. It is important that you put your used needles and syringes in a special sharps container. Do not put them in a trash can. If you do not have a sharps container, call your pharmacist or healthcare provider to get one. Talk to your pediatrician regarding the use of this medicine in children. While this drug may be prescribed for children as young  as 7 months for selected conditions, precautions do apply. Overdosage: If you think you have taken too much of this medicine contact a poison control center or emergency room at once. NOTE: This medicine is only for you. Do not share this medicine with others. What if I miss a dose? It is important not to miss your dose. Call your doctor or health care professional if you miss a dose. What may interact with this medicine? This medicine may interact with the following medications: -medicines that may cause a release of neutrophils, such as lithium This list may not describe all possible interactions. Give your health care provider a list of all the medicines, herbs, non-prescription drugs, or dietary supplements you use. Also tell them if you smoke, drink alcohol, or use illegal drugs. Some items may interact with your medicine. What should I watch for while using this medicine? You may need blood work done while you are taking this medicine. What side effects may I notice from receiving this medicine? Side effects that you should report to your doctor or health care professional as soon as possible: -allergic reactions like skin rash, itching or hives, swelling of the face, lips, or tongue -dizziness or feeling faint -fever -pain, redness, or irritation at site where injected -pinpoint red spots on the skin -shortness of breath or breathing problems -stomach or side pain, or pain at the shoulder -swelling -tiredness -trouble passing urine -unusual bleeding or bruising Side effects that usually do not require medical attention (report to your doctor or health care professional if they continue or are bothersome): -bone pain -headache -muscle pain This list may not describe all possible side  effects. Call your doctor for medical advice about side effects. You may report side effects to FDA at 1-800-FDA-1088. Where should I keep my medicine? Keep out of the reach of children. Store in a  refrigerator between 2 and 8 degrees C (36 and 46 degrees F). Do not freeze. Keep in carton to protect from light. Throw away this medicine if vials or syringes are left out of the refrigerator for more than 24 hours. Throw away any unused medicine after the expiration date. NOTE: This sheet is a summary. It may not cover all possible information. If you have questions about this medicine, talk to your doctor, pharmacist, or health care provider.  2015, [employer]. (2014-01-18 17:00:01) Platelet Transfusion Information This is information about transfusions of platelets. Platelets are tiny cells made by the bone marrow and found in the blood. When a blood vessel is damaged, platelets rush to the damaged area to help form a clot. This begins the healing process. When platelets get very low, your blood may have trouble clotting. This may be from:  Illness.  Blood disorder.  Chemotherapy to treat cancer. Often, lower platelet counts do not cause problems.  Platelets usually last for 7 to 10 days. If they are not used in an injury, they are broken down by the liver or spleen. Symptoms of low platelet count include:  Nosebleeds.  Bleeding gums.  Heavy periods.  Bruising and tiny blood spots in the skin.  Pinpoint spots of bleeding (petechiae).  Larger bruises (purpura).  Bleeding can be more serious if it happens in the brain or bowel. Platelet transfusions are often used to keep the platelet count at an acceptable level. Serious bleeding due to low platelets is uncommon. RISKS AND COMPLICATIONS Severe side effects from platelet transfusions are uncommon. Minor reactions may include:  Itching.  Rashes.  High temperature and shivering. Medications are available to stop transfusion reactions. Let your health care provider know if you develop any of the above problems.  If you are having platelet transfusions frequently, they may get less effective. This is called becoming  refractory to platelets. It is uncommon. This can happen from non-immune causes and immune causes. Non-immune causes include:  High temperatures.  Some medications.  An enlarged spleen. Immune causes happen when your body discovers the platelets are not your own and begins making antibodies against them. The antibodies kill the platelets quickly. Even with platelet transfusions, you may still notice problems with bleeding or bruising. Let your health care providers know about this. Other things can be done to help if this happens.  BEFORE THE PROCEDURE   Your health care provider will check your platelet count regularly.  If the platelet count is too low, it may be necessary to have a platelet transfusion.  This is more important before certain procedures with a risk of bleeding, such as a spinal tap.  Platelet transfusion reduces the risk of bleeding during or after the procedure.  Except in emergencies, giving a transfusion requires a written consent. Before blood is taken from a donor, a complete history is taken to make sure the person has no history of previous diseases, nor engages in risky social behavior. Examples of this are intravenous drug use or sexual activity with multiple partners. This could lead to infected blood or blood products being used. This history is taken in spite of the extensive testing to make sure the blood is safe. All blood products transfused are tested to make sure it is a Orthoptist  for the person getting the blood. It is also checked for infections. Blood is the safest it has ever been. The risk of getting an infection is very low. PROCEDURE  The platelets are stored in small plastic bags that are kept at a low temperature.  Each bag is called a unit and sometimes two units are given. They are given through an intravenous line by drip infusion over about one-half hour.  Usually blood is collected from multiple people to get enough to transfuse.  Sometimes,  the platelets are collected from a single person. This is done using a special machine that separates the platelets from the blood. The machine is called an apheresis machine. Platelets collected in this way are called apheresed platelets. Apheresed platelets reduce the risk of becoming sensitive to the platelets. This lowers the chances of having a transfusion reaction.  As it only takes a short time to give the platelets, this treatment can be given in an outpatient department. Platelets can also be given before or after other treatments. SEEK IMMEDIATE MEDICAL CARE IF: You have any of the following symptoms over the next 12 hours or several days:  Shaking chills.  Fever with a temperature greater than 102F (38.9C) develops.  Back pain or muscle pain.  People around you feel you are not acting correctly, or you are confused.  Blood in the urine or bowel movements, or bleeding from any place in your body.  Shortness of breath, or difficulty breathing.  Dizziness.  Fainting.  You break out in a rash or develop hives.  Decrease in the amount of urine you are putting out, or the urine turns a dark color or changes to pink, red, or brown.  A severe headache or stiff neck.  Bruising more easily. Document Released: 08/01/2007 Document Revised: 02/18/2014 Document Reviewed: 08/01/2007 [facility] Patient Information 2015 [place], [place]. This information is not intended to replace advice given to you by your health care provider. Make sure you discuss any questions you have with your health care provider.

## 2014-11-21 NOTE — Progress Notes (Signed)
Patient presented to the [facility] today for a lab draw.  Patient was noted to be neutropenic with an ANC of 0.2 and platelet count down to 6.  Patient is complaining of some occasional hemoptysis.  Patient will be given Granix 480 g subcutaneously both today and tomorrow; and will also receive 2 units of platelets this afternoon as well.  Gave patient a copy of all of her labs today.

## 2014-11-22 ENCOUNTER — Ambulatory Visit (HOSPITAL_BASED_OUTPATIENT_CLINIC_OR_DEPARTMENT_OTHER): Payer: Medicare Other

## 2014-11-22 ENCOUNTER — Other Ambulatory Visit: Payer: Self-pay | Admitting: Nurse Practitioner

## 2014-11-22 ENCOUNTER — Encounter: Payer: Self-pay | Admitting: Nurse Practitioner

## 2014-11-22 VITALS — BP 142/63 | HR 110 | Temp 98.1°F

## 2014-11-22 DIAGNOSIS — T451X5A Adverse effect of antineoplastic and immunosuppressive drugs, initial encounter: Principal | ICD-10-CM

## 2014-11-22 DIAGNOSIS — D61810 Antineoplastic chemotherapy induced pancytopenia: Secondary | ICD-10-CM | POA: Insufficient documentation

## 2014-11-22 DIAGNOSIS — R042 Hemoptysis: Secondary | ICD-10-CM | POA: Insufficient documentation

## 2014-11-22 DIAGNOSIS — C3411 Malignant neoplasm of upper lobe, right bronchus or lung: Secondary | ICD-10-CM

## 2014-11-22 LAB — PREPARE PLATELET PHERESIS
Unit division: 0
Unit division: 0

## 2014-11-22 MED ORDER — TBO-FILGRASTIM 480 MCG/0.8ML ~~LOC~~ SOSY
480.0000 ug | PREFILLED_SYRINGE | Freq: Once | SUBCUTANEOUS | Status: AC
  Administered 2014-11-22: 480 ug via SUBCUTANEOUS
  Filled 2014-11-22: qty 0.8

## 2014-11-22 NOTE — Assessment & Plan Note (Signed)
Patient received cycle 1, day 8 of her carboplatin/gemcitabine chemotherapy regimen on 11/14/2014.  She is scheduled to return on 11/28/2014 for cycle 2, day 1 of the same regimen.

## 2014-11-22 NOTE — Assessment & Plan Note (Signed)
Patient has developed hemoptysis within the past 24 hours.  Labs drawn today reveal a platelet count of only 6.  Patient will receive 2 units platelet transfusion this afternoon; and this will hopefully resolve all proptosis issues.  Patient denies any dyspnea, pain with inspiration, or other new issues.

## 2014-11-22 NOTE — Progress Notes (Signed)
SYMPTOM MANAGEMENT CLINIC   HPI: Janice Ball 59 y.o. female diagnosed with lung cancer.  Patient is status post chemotherapy, radiation therapy, and Nivolumab immunotherapy-recently discontinued due to disease progression.  Currently undergoing carboplatin/gemcitabine chemotherapy regimen.  Patient presented as a walk-in today to the [place] with complaint of new onset hemoptysis within the past 24 hours.  She denies any specific increased dyspnea, pain with inspiration, chest pain, or chest pressure.  She denies any URI symptoms or recent fever/chills.  Patient also denies any worsening issues with either easy bleeding or bruising otherwise.   HPI  CURRENT THERAPY: Upcoming Treatment Dates - LUNG Carboplatin D1 / Gemcitabine D1,8 q21d Days with orders from any treatment category:  11/28/2014      SCHEDULING COMMUNICATION      CBC with Differential      Comprehensive metabolic panel      ondansetron (ZOFRAN) IVPB 16 mg      Dexamethasone Sodium Phosphate (DECADRON) injection 20 mg      CARBOplatin CHEMO intradermal Test Dose 100 mcg/0.51m      Gemcitabine HCl (GEMZAR) 1,938 mg in sodium chloride 0.9 % 100 mL chemo infusion      CARBOplatin (PARAPLATIN) in sodium chloride 0.9 % 100 mL chemo infusion      sodium chloride 0.9 % injection 10 mL      heparin lock flush 100 unit/mL      heparin lock flush 100 unit/mL      alteplase (CATHFLO ACTIVASE) injection 2 mg      sodium chloride 0.9 % injection 3 mL      Cold Pack 1 packet      0.9 %  sodium chloride infusion      TREATMENT CONDITIONS 12/05/2014      SCHEDULING COMMUNICATION      CBC with Differential      Comprehensive metabolic panel      prochlorperazine (COMPAZINE) tablet 10 mg      Gemcitabine HCl (GEMZAR) 1,938 mg in sodium chloride 0.9 % 100 mL chemo infusion      sodium chloride 0.9 % injection 10 mL      heparin lock flush 100 unit/mL      heparin lock flush 100 unit/mL      alteplase (CATHFLO ACTIVASE)  injection 2 mg      sodium chloride 0.9 % injection 3 mL      Cold Pack 1 packet      0.9 %  sodium chloride infusion      TREATMENT CONDITIONS 12/19/2014      SCHEDULING COMMUNICATION      CBC with Differential      Comprehensive metabolic panel      ondansetron (ZOFRAN) IVPB 16 mg      Dexamethasone Sodium Phosphate (DECADRON) injection 20 mg      CARBOplatin CHEMO intradermal Test Dose 100 mcg/0.082m     Gemcitabine HCl (GEMZAR) 1,938 mg in sodium chloride 0.9 % 100 mL chemo infusion      CARBOplatin (PARAPLATIN) in sodium chloride 0.9 % 100 mL chemo infusion      sodium chloride 0.9 % injection 10 mL      heparin lock flush 100 unit/mL      heparin lock flush 100 unit/mL      alteplase (CATHFLO ACTIVASE) injection 2 mg      sodium chloride 0.9 % injection 3 mL      Cold Pack 1 packet      0.9 %  sodium chloride infusion      TREATMENT CONDITIONS    ROS  Past Medical History  Diagnosis Date  . Anxiety   . Hyperlipidemia   . Hypothyroidism   . COPD (chronic obstructive pulmonary disease)   . Arthritis     thumbs  . 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 Diabetes mellitus   . Lung cancer dx'd 05/2009  . Lung cancer dx'd 09/2013    recurrence in LN  . 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 Thyroidectomy, partial    . Tonsillectomy    . Tubal ligation    . Partial hysterectomy      has Primary cancer of right upper lobe of lung; Hypothyroidism; DM type 2 (diabetes mellitus, type 2); HYPERLIPIDEMIA; ANXIETY; COPD with acute exacerbation; OSTEOPOROSIS; Nonspecific (abnormal) findings on radiological and other examination of body structure; COPD exacerbation; Smoking; Fatigue; Hoarseness of voice; Nasal drainage; CAP (community acquired pneumonia); Sinus tachycardia; Lactic acidosis; Febrile illness, acute; Dehydration; SIRS (systemic inflammatory response  syndrome); Need for prophylactic vaccination against Streptococcus pneumoniae (pneumococcus); COPD, moderate; Chest pain; SVC syndrome; Hemoptysis; and Pancytopenia due to antineoplastic chemotherapy on her problem list.    is allergic to sulfonamide derivatives; codeine; dilaudid; penicillins; and vicodin.    Medication List       This list is accurate as of: 11/21/14 11:59 PM.  Always use your most recent med list.               acetaminophen 325 MG tablet  Commonly known as:  TYLENOL  Take 650 mg by mouth every 6 (six) hours as needed for mild pain or headache.     alendronate 10 MG tablet  Commonly known as:  FOSAMAX  Take 10 mg by mouth daily.     aspirin 325 MG tablet  Take 325 mg by mouth daily with breakfast.     cholecalciferol 1000 UNITS tablet  Commonly known as:  VITAMIN D  Take 1,000 Units by mouth every morning.     cyanocobalamin 1000 MCG tablet  Take 1,000 mcg by mouth every morning.     DSS 100 MG Caps  Take 100 mg by mouth 2 (two) times daily.     glimepiride 2 MG tablet  Commonly known as:  AMARYL  Take 2 mg by mouth daily with breakfast.     guaiFENesin 600 MG 12 hr tablet  Commonly known as:  MUCINEX  Take 1,200 mg by mouth 2 (two) times daily.     ipratropium 0.02 % nebulizer solution  Commonly known as:  ATROVENT  Take 2.5 mLs (0.5 mg total) by nebulization 4 (four) times daily.     levothyroxine 125 MCG tablet  Commonly known as:  SYNTHROID, LEVOTHROID  Take 125 mcg by mouth daily before breakfast.     lidocaine-prilocaine cream  Commonly known as:  EMLA  Apply 1 application topically as needed. Apply to port 1 hr before chemo     LORazepam 1 MG tablet  Commonly known as:  ATIVAN  Take 1 mg by mouth 3 (three) times daily as needed for anxiety.     magnesium gluconate 500 MG tablet  Commonly known as:  MAGONATE  Take 500 mg by mouth every morning.     metFORMIN 1000 MG tablet  Commonly known as:  GLUCOPHAGE  Take 1,000 mg by mouth  2 (two) times daily  with a meal.     methylPREDNIsolone 4 MG tablet  Commonly known as:  MEDROL DOSPACK  follow package directions     multivitamin with minerals Tabs tablet  Take 1 tablet by mouth every morning.     OVER THE COUNTER MEDICATION  Take 1 capsule by mouth daily. MSM. For energy and joint pain     oxyCODONE 5 MG immediate release tablet  Commonly known as:  Oxy IR/ROXICODONE  Take 1 tablet (5 mg total) by mouth every 4 (four) hours as needed for moderate pain.     pantoprazole 40 MG tablet  Commonly known as:  PROTONIX  Take 1 tablet (40 mg total) by mouth daily at 12 noon.     albuterol 108 (90 BASE) MCG/ACT inhaler  Commonly known as:  PROVENTIL HFA;VENTOLIN HFA  Inhale 1 puff into the lungs every 6 (six) hours as needed for wheezing or shortness of breath.     PROAIR HFA 108 (90 BASE) MCG/ACT inhaler  Generic drug:  albuterol  INHALE 2 PUFFS INTO THE LUNGS EVERY 6 (SIX) HOURS AS NEEDED FOR WHEEZING OR SHORTNESS OF BREATH.     prochlorperazine 10 MG tablet  Commonly known as:  COMPAZINE  Take 1 tablet (10 mg total) by mouth every 6 (six) hours as needed for nausea or vomiting.     sertraline 100 MG tablet  Commonly known as:  ZOLOFT  Take 150 mg by mouth at bedtime.     spironolactone 25 MG tablet  Commonly known as:  ALDACTONE  Take 25 mg by mouth every morning.     budesonide-formoterol 160-4.5 MCG/ACT inhaler  Commonly known as:  SYMBICORT  Inhale 2 puffs into the lungs 2 (two) times daily.     SYMBICORT 160-4.5 MCG/ACT inhaler  Generic drug:  budesonide-formoterol  INHALE 2 PUFFS INTO THE LUNGS 2 (TWO) TIMES DAILY.     varenicline 1 MG tablet  Commonly known as:  CHANTIX  Take 1 mg by mouth 2 (two) times daily.         PHYSICAL EXAMINATION  Vitals: BP 124/62, 107, temp 98.2, 96%  Physical Exam  Constitutional: She is oriented to person, place, and time. Vital signs are normal. She appears unhealthy.  HENT:  Head: Normocephalic and  atraumatic.  Mouth/Throat: Oropharynx is clear and moist.  Eyes: Conjunctivae and EOM are normal. Pupils are equal, round, and reactive to light. Right eye exhibits no discharge. Left eye exhibits no discharge. No scleral icterus.  Neck: Normal range of motion. Neck supple.  Pulmonary/Chest: Effort normal and breath sounds normal. No respiratory distress.  Patient observed with occasional cough and bright red blood hemoptysis.  No acute respiratory distress.  Musculoskeletal: Normal range of motion.  Neurological: She is alert and oriented to person, place, and time. Gait normal.  Skin: Skin is warm and dry. There is pallor.  Psychiatric: Affect normal.  Nursing note and vitals reviewed.   LABORATORY DATA:. Appointment on 11/21/2014  Component Date Value Ref Range Status  . WBC 11/21/2014 0.6* 3.9 - 10.3 10e3/uL Final  . NEUT# 11/21/2014 0.2* 1.5 - 6.5 10e3/uL Final  . HGB 11/21/2014 10.1* 11.6 - 15.9 g/dL Final  . HCT 11/21/2014 31.2* 34.8 - 46.6 % Final  . Platelets 11/21/2014 6* 145 - 400 10e3/uL Final  . MCV 11/21/2014 81.3  79.5 - 101.0 fL Final  . MCH 11/21/2014 26.3  25.1 - 34.0 pg Final  . MCHC 11/21/2014 32.4  31.5 - 36.0 g/dL Final  . RBC  11/21/2014 3.84  3.70 - 5.45 10e6/uL Final  . RDW 11/21/2014 13.9  11.2 - 14.5 % Final  . lymph# 11/21/2014 0.4* 0.9 - 3.3 10e3/uL Final  . MONO# 11/21/2014 0.0* 0.1 - 0.9 10e3/uL Final  . Eosinophils Absolute 11/21/2014 0.0  0.0 - 0.5 10e3/uL Final  . Basophils Absolute 11/21/2014 0.0  0.0 - 0.1 10e3/uL Final  . NEUT% 11/21/2014 32.1* 38.4 - 76.8 % Final  . LYMPH% 11/21/2014 64.3* 14.0 - 49.7 % Final  . MONO% 11/21/2014 1.8  0.0 - 14.0 % Final  . EOS% 11/21/2014 1.8  0.0 - 7.0 % Final  . BASO% 11/21/2014 0.0  0.0 - 2.0 % Final  . nRBC 11/21/2014 0  0 - 0 % Final  . TSH 11/21/2014 4.196* 0.308 - 3.960 m(IU)/L Final  . Sodium 11/21/2014 138  136 - 145 mEq/L Final  . Potassium 11/21/2014 3.9  3.5 - 5.1 mEq/L Final  . Chloride  11/21/2014 100  98 - 109 mEq/L Final  . CO2 11/21/2014 24  22 - 29 mEq/L Final  . Glucose 11/21/2014 309* 70 - 140 mg/dl Final  . BUN 11/21/2014 8.1  7.0 - 26.0 mg/dL Final  . Creatinine 11/21/2014 0.8  0.6 - 1.1 mg/dL Final  . Total Bilirubin 11/21/2014 0.25  0.20 - 1.20 mg/dL Final  . Alkaline Phosphatase 11/21/2014 73  40 - 150 U/L Final  . AST 11/21/2014 23  5 - 34 U/L Final  . ALT 11/21/2014 25  0 - 55 U/L Final  . Total Protein 11/21/2014 6.7  6.4 - 8.3 g/dL Final  . Albumin 11/21/2014 3.3* 3.5 - 5.0 g/dL Final  . Calcium 11/21/2014 8.1* 8.4 - 10.4 mg/dL Final  . Anion Gap 11/21/2014 14* 3 - 11 mEq/L Final  . EGFR 11/21/2014 82* >90 ml/min/1.73 m2 Final   eGFR is calculated using the CKD-EPI Creatinine Equation (2009)  Hospital Outpatient Visit on 11/21/2014  Component Date Value Ref Range Status  . Unit Number 11/21/2014 [id number]   Final  . Blood Component Type 11/21/2014 PLTP LR2 PAS   Final  . Unit division 11/21/2014 00   Final  . Status of Unit 11/21/2014 ISSUED,FINAL   Final  . Transfusion Status 11/21/2014 OK TO TRANSFUSE   Final  . Unit Number 11/21/2014 [id number]   Final  . Blood Component Type 11/21/2014 PLTP LR2 PAS   Final  . Unit division 11/21/2014 00   Final  . Status of Unit 11/21/2014 ISSUED,FINAL   Final  . Transfusion Status 11/21/2014 OK TO TRANSFUSE   Final  . ABO/RH(D) 11/21/2014 A NEG   Final     RADIOGRAPHIC STUDIES: No results found.  ASSESSMENT/PLAN:    Hemoptysis Patient has developed hemoptysis within the past 24 hours.  Labs drawn today reveal a platelet count of only 6.  Patient will receive 2 units platelet transfusion this afternoon; and this will hopefully resolve all proptosis issues.  Patient denies any dyspnea, pain with inspiration, or other new issues.   Primary cancer of right upper lobe of lung Patient received cycle 1, day 8 of her carboplatin/gemcitabine chemotherapy regimen on 11/14/2014.  She is scheduled to return on  11/28/2014 for cycle 2, day 1 of the same regimen.   Pancytopenia due to antineoplastic chemotherapy Labs drawn today reveal significant neutropenia with an ANC of 0.2.  Hemoglobin remains fairly stable at 10.1.  Pleasant count has dropped down to 6.  Did briefly review all neutropenia guidelines with the patient this afternoon.  Patient  will receive Granix 180 g subcutaneously both today and tomorrow for growth factor support.  Also, patient is complaining of new onset hemoptysis within the past 24 hours.  Patient will receive 2 units of platelets this afternoon as well.   Patient stated understanding of all instructions; and was in agreement with this plan of care. The patient knows to call the clinic with any problems, questions or concerns.   Review/collaboration with Dr. [name] regarding all aspects of patient's visit today.   Total time spent with patient was 15 minutes;  with greater than 75 percent of that time spent in face to face counseling regarding patient's symptoms,  and coordination of care and follow up.  Disclaimer: This note was dictated with voice recognition software. Similar sounding words can inadvertently be transcribed and may not be corrected upon review.   Drue Second, NP 11/22/2014

## 2014-11-22 NOTE — Assessment & Plan Note (Signed)
Labs drawn today reveal significant neutropenia with an ANC of 0.2.  Hemoglobin remains fairly stable at 10.1.  Pleasant count has dropped down to 6.  Did briefly review all neutropenia guidelines with the patient this afternoon.  Patient will receive Granix 180 g subcutaneously both today and tomorrow for growth factor support.  Also, patient is complaining of new onset hemoptysis within the past 24 hours.  Patient will receive 2 units of platelets this afternoon as well.

## 2014-11-26 ENCOUNTER — Other Ambulatory Visit: Payer: Self-pay | Admitting: Pharmacist

## 2014-11-28 ENCOUNTER — Ambulatory Visit (HOSPITAL_COMMUNITY): Admission: RE | Admit: 2014-11-28 | Payer: Medicare Other | Source: Ambulatory Visit

## 2014-11-28 ENCOUNTER — Ambulatory Visit (HOSPITAL_BASED_OUTPATIENT_CLINIC_OR_DEPARTMENT_OTHER): Payer: Medicare Other

## 2014-11-28 ENCOUNTER — Encounter: Payer: Self-pay | Admitting: Physician Assistant

## 2014-11-28 ENCOUNTER — Other Ambulatory Visit (HOSPITAL_BASED_OUTPATIENT_CLINIC_OR_DEPARTMENT_OTHER): Payer: Medicare Other

## 2014-11-28 ENCOUNTER — Ambulatory Visit (HOSPITAL_BASED_OUTPATIENT_CLINIC_OR_DEPARTMENT_OTHER): Payer: Medicare Other | Admitting: Physician Assistant

## 2014-11-28 VITALS — BP 144/55 | HR 131 | Temp 97.6°F | Resp 20 | Ht 66.0 in | Wt 175.5 lb

## 2014-11-28 DIAGNOSIS — Z5111 Encounter for antineoplastic chemotherapy: Secondary | ICD-10-CM

## 2014-11-28 DIAGNOSIS — C3411 Malignant neoplasm of upper lobe, right bronchus or lung: Secondary | ICD-10-CM

## 2014-11-28 DIAGNOSIS — R600 Localized edema: Secondary | ICD-10-CM

## 2014-11-28 DIAGNOSIS — C3491 Malignant neoplasm of unspecified part of right bronchus or lung: Secondary | ICD-10-CM

## 2014-11-28 LAB — CBC WITH DIFFERENTIAL/PLATELET
BASO%: 0.9 % (ref 0.0–2.0)
BASOS ABS: 0.1 10*3/uL (ref 0.0–0.1)
EOS%: 1.8 % (ref 0.0–7.0)
Eosinophils Absolute: 0.1 10*3/uL (ref 0.0–0.5)
HEMATOCRIT: 34.6 % — AB (ref 34.8–46.6)
HGB: 11.1 g/dL — ABNORMAL LOW (ref 11.6–15.9)
LYMPH%: 19.4 % (ref 14.0–49.7)
MCH: 26.8 pg (ref 25.1–34.0)
MCHC: 32.1 g/dL (ref 31.5–36.0)
MCV: 83.6 fL (ref 79.5–101.0)
MONO#: 0.9 10*3/uL (ref 0.1–0.9)
MONO%: 15.7 % — ABNORMAL HIGH (ref 0.0–14.0)
NEUT%: 62.2 % (ref 38.4–76.8)
NEUTROS ABS: 3.4 10*3/uL (ref 1.5–6.5)
PLATELETS: 207 10*3/uL (ref 145–400)
RBC: 4.14 10*6/uL (ref 3.70–5.45)
RDW: 16.4 % — AB (ref 11.2–14.5)
WBC: 5.4 10*3/uL (ref 3.9–10.3)
lymph#: 1.1 10*3/uL (ref 0.9–3.3)
nRBC: 0 % (ref 0–0)

## 2014-11-28 LAB — COMPREHENSIVE METABOLIC PANEL (CC13)
ALK PHOS: 80 U/L (ref 40–150)
ALT: 18 U/L (ref 0–55)
AST: 20 U/L (ref 5–34)
Albumin: 3.6 g/dL (ref 3.5–5.0)
Anion Gap: 15 mEq/L — ABNORMAL HIGH (ref 3–11)
BILIRUBIN TOTAL: 0.23 mg/dL (ref 0.20–1.20)
BUN: 5.6 mg/dL — ABNORMAL LOW (ref 7.0–26.0)
CALCIUM: 8.3 mg/dL — AB (ref 8.4–10.4)
CHLORIDE: 103 meq/L (ref 98–109)
CO2: 22 mEq/L (ref 22–29)
Creatinine: 0.8 mg/dL (ref 0.6–1.1)
EGFR: 79 mL/min/{1.73_m2} — ABNORMAL LOW (ref >90)
Glucose: 219 mg/dl — ABNORMAL HIGH (ref 70–140)
Potassium: 3.8 mEq/L (ref 3.5–5.1)
Sodium: 140 mEq/L (ref 136–145)
Total Protein: 6.9 g/dL (ref 6.4–8.3)

## 2014-11-28 MED ORDER — SODIUM CHLORIDE 0.9 % IV SOLN
1000.0000 mg/m2 | Freq: Once | INTRAVENOUS | Status: AC
  Administered 2014-11-28: 1938 mg via INTRAVENOUS
  Filled 2014-11-28: qty 51

## 2014-11-28 MED ORDER — PROCHLORPERAZINE EDISYLATE 5 MG/ML IJ SOLN
10.0000 mg | Freq: Once | INTRAMUSCULAR | Status: AC
  Administered 2014-11-28: 10 mg via INTRAVENOUS

## 2014-11-28 MED ORDER — HEPARIN SOD (PORK) LOCK FLUSH 100 UNIT/ML IV SOLN
500.0000 [IU] | Freq: Once | INTRAVENOUS | Status: AC | PRN
  Administered 2014-11-28: 500 [IU]
  Filled 2014-11-28: qty 5

## 2014-11-28 MED ORDER — ONDANSETRON 16 MG/50ML IVPB (CHCC): 16.0000 mg | Freq: Once | INTRAVENOUS | Status: DC

## 2014-11-28 MED ORDER — PROCHLORPERAZINE EDISYLATE 5 MG/ML IJ SOLN
INTRAMUSCULAR | Status: AC
  Filled 2014-11-28: qty 2

## 2014-11-28 MED ORDER — PROCHLORPERAZINE MALEATE 10 MG PO TABS: 10.0000 mg | ORAL_TABLET | Freq: Four times a day (QID) | ORAL | Status: DC | PRN

## 2014-11-28 MED ORDER — SODIUM CHLORIDE 0.9 % IJ SOLN
10.0000 mL | INTRAMUSCULAR | Status: DC | PRN
  Administered 2014-11-28: 10 mL
  Filled 2014-11-28: qty 10

## 2014-11-28 MED ORDER — DEXAMETHASONE SODIUM PHOSPHATE 20 MG/5ML IJ SOLN: 20.0000 mg | Freq: Once | INTRAMUSCULAR | Status: DC

## 2014-11-28 MED ORDER — OXYCODONE HCL 5 MG PO TABS: 5.0000 mg | ORAL_TABLET | ORAL | Status: DC | PRN

## 2014-11-28 MED ORDER — CARBOPLATIN CHEMO INTRADERMAL TEST DOSE 100MCG/0.02ML
100.0000 ug | Freq: Once | INTRADERMAL | Status: AC
  Administered 2014-11-28: 100 ug via INTRADERMAL
  Filled 2014-11-28: qty 0.01

## 2014-11-28 NOTE — Progress Notes (Signed)
[facility] [phone]) [phone]   [phone]  OFFICE PROGRESS NOTE  [name], NP [place] [address]  DIAGNOSIS: Recurrent non-small cell lung cancer, squamous cell carcinoma initially diagnosis stage IIIa (T1 N2 MX ) in August of 2010.   PRIOR THERAPY:  #1 status post concurrent chemoradiation with weekly carboplatin and paclitaxel, last dose of chemotherapy given 08/05/2011.  #2 status post consolidation chemotherapy with carboplatin paclitaxel last dose given 10/27/2009.  #3 Concurrent chemoradiation with weekly carboplatin for an AUC of 2 and paclitaxel at 45 mg per meter squared given concurrent with radiotherapy, last dose was given 09/20/2011.  #4 Systemic chemotherapy with gemcitabine 1000 mg meter squared on days 1 and 8 every 3 weeks status post 3 cycles with stable disease, last dose was given 12/20/2011.  #5 Systemic chemotherapy again with single agent gemcitabine 1000 mg/M2 on days 1 and 8 every 3 weeks. First cycle 11/07/2013. Status post 4 cycles. #6  Systemic chemotherapy again with single agent gemcitabine 1000 mg/M2 on days 1 and 8 every 3 weeks. First dose 07/24/2014. Discontinued today secondary to intolerance. #7  Immunotherapy with Nivolumab 3 MG/KG every 2 weeks. First dose 09/18/2014. She is status post 2 cycles. Last dose was given 10/02/2014 discontinued secondary to disease progression with significant SVC syndrome.  CURRENT THERAPY: Systemic chemotherapy with carboplatin for AUC of 5 on day 1 and gemcitabine 1000 MG/M2 on days 1 and 8 every 3 weeks. First dose 11/07/2014. Status post 1 cycle.  INTERVAL HISTORY: Janice Ball 59 y.o. female returns to the clinic today for a follow-up visit. She presents to proceed with day 1 of cycle 2 of her systemic chemotherapy with carboplatin and gemcitabine.she requests refill prescriptions for her Compazine and oxycodone 5/325 mg.she complains of waking this  morning with her left arm and hand being swollen and warm. She denies any specific trauma. She was no other specific complaints today. She denied fever, chills, shortness of breath, cough or hemoptysis. Denied nausea, vomiting, diarrhea or constipation. She's had no significant weight loss or night sweats.  MEDICAL HISTORY: Past Medical History  Diagnosis Date  . Anxiety   . Hyperlipidemia   . Hypothyroidism   . COPD (chronic obstructive pulmonary disease)   . Arthritis     thumbs  . 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 Diabetes mellitus   . Lung cancer dx'd 05/2009  . Lung cancer dx'd 09/2013    recurrence in LN  . 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     ALLERGIES:  is allergic to sulfonamide derivatives; codeine; dilaudid; penicillins; and vicodin.  MEDICATIONS:  Current Outpatient Prescriptions  Medication Sig Dispense Refill  . acetaminophen (TYLENOL) 325 MG tablet Take 650 mg by mouth every 6 (six) hours as needed for mild pain or headache.     . albuterol (PROVENTIL HFA;VENTOLIN HFA) 108 (90 BASE) MCG/ACT inhaler Inhale 1 puff into the lungs every 6 (six) hours as needed for wheezing or shortness of breath.    Marland Kitchen alendronate (FOSAMAX) 10 MG tablet Take 10 mg by mouth daily.     Marland Kitchen aspirin 325 MG tablet Take 325 mg by mouth daily with breakfast.     . budesonide-formoterol (SYMBICORT) 160-4.5 MCG/ACT inhaler Inhale 2 puffs into the lungs 2 (two) times daily.    . cholecalciferol (VITAMIN D) 1000 UNITS tablet Take 1,000 Units by mouth every morning.     Marland Kitchen  cyanocobalamin 1000 MCG tablet Take 1,000 mcg by mouth every morning.     Marland Kitchen glimepiride (AMARYL) 2 MG tablet Take 2 mg by mouth daily with breakfast.     . guaiFENesin (MUCINEX) 600 MG 12 hr tablet Take 1,200 mg by mouth 2 (two) times daily.    Marland Kitchen ipratropium (ATROVENT) 0.02 % nebulizer solution Take 2.5 mLs (0.5 mg total) by nebulization 4 (four) times daily. 150 mL 4    . levothyroxine (SYNTHROID, LEVOTHROID) 125 MCG tablet Take 125 mcg by mouth daily before breakfast.     . lidocaine-prilocaine (EMLA) cream Apply 1 application topically as needed. Apply to port 1 hr before chemo 30 g 0  . LORazepam (ATIVAN) 1 MG tablet Take 1 mg by mouth 3 (three) times daily as needed for anxiety.     . magnesium gluconate (MAGONATE) 500 MG tablet Take 500 mg by mouth every morning.     . metFORMIN (GLUCOPHAGE) 1000 MG tablet Take 1,000 mg by mouth 2 (two) times daily with a meal.    . Multiple Vitamin (MULTIVITAMIN WITH MINERALS) TABS tablet Take 1 tablet by mouth every morning.    Marland Kitchen OVER THE COUNTER MEDICATION Take 1 capsule by mouth daily. MSM. For energy and joint pain    . oxyCODONE (OXY IR/ROXICODONE) 5 MG immediate release tablet Take 1 tablet (5 mg total) by mouth every 4 (four) hours as needed for moderate pain. 60 tablet 0  . PROAIR HFA 108 (90 BASE) MCG/ACT inhaler INHALE 2 PUFFS INTO THE LUNGS EVERY 6 (SIX) HOURS AS NEEDED FOR WHEEZING OR SHORTNESS OF BREATH. 8.5 each 2  . prochlorperazine (COMPAZINE) 10 MG tablet Take 1 tablet (10 mg total) by mouth every 6 (six) hours as needed for nausea or vomiting. 30 tablet 1  . sertraline (ZOLOFT) 100 MG tablet Take 150 mg by mouth at bedtime.     Marland Kitchen spironolactone (ALDACTONE) 25 MG tablet Take 25 mg by mouth every morning.     . docusate sodium 100 MG CAPS Take 100 mg by mouth 2 (two) times daily. (Patient not taking: Reported on 11/28/2014) 10 capsule 0   No current facility-administered medications for this visit.   Facility-Administered Medications Ordered in Other Visits  Medication Dose Route Frequency Provider Last Rate Last Dose  . sodium chloride 0.9 % injection 10 mL  10 mL Intracatheter PRN Curt Bears, MD   10 mL at 11/07/14 1558  . sodium chloride 0.9 % injection 10 mL  10 mL Intracatheter PRN Curt Bears, MD   10 mL at 11/28/14 1658    SURGICAL HISTORY:  Past Surgical History  Procedure Laterality  Date  . Thyroidectomy, partial    . Tonsillectomy    . Tubal ligation    . Partial hysterectomy      REVIEW OF SYSTEMS:  Constitutional: positive for fatigue Eyes: negative Ears, nose, mouth, throat, and face: negative Respiratory: negative Cardiovascular: negative Gastrointestinal: negative Genitourinary:negative Integument/breast: negative Hematologic/lymphatic: negative Musculoskeletal:positive for Swelling and warmth of the left upper extremity extending into the hand Neurological: negative Behavioral/Psych: negative Endocrine: negative Allergic/Immunologic: negative   PHYSICAL EXAMINATION: General appearance: alert, cooperative and no distress Head: Normocephalic, without obvious abnormality, atraumatic Neck: no adenopathy, no JVD, supple, symmetrical, trachea midline and thyroid not enlarged, symmetric, no tenderness/mass/nodules Lymph nodes: Cervical, supraclavicular, and axillary nodes normal. Resp: wheezes bilaterally and Minimal Back: symmetric, no curvature. ROM normal. No CVA tenderness. Cardio: regular rate and rhythm, S1, S2 normal, no murmur, click, rub or gallop GI: soft,  non-tender; bowel sounds normal; no masses,  no organomegaly Extremities: edema Janice Ball 2+ edema left upper extremity with warmth and tenderness particularly in the upper biceps area Neurologic: Alert and oriented X 3, normal strength and tone. Normal symmetric reflexes. Normal coordination and gait  ECOG PERFORMANCE STATUS: 1 - Symptomatic but completely ambulatory  Blood pressure 144/55, pulse 131, temperature 97.6 F (36.4 C), temperature source Oral, resp. rate 20, height 5\' 6"  (1.676 m), weight 175 lb 8 oz (79.606 kg).  LABORATORY DATA: Lab Results  Component Value Date   WBC 5.4 11/28/2014   HGB 11.1* 11/28/2014   HCT 34.6* 11/28/2014   MCV 83.6 11/28/2014   PLT 207 11/28/2014      Chemistry      Component Value Date/Time   NA 140 11/28/2014 1320   NA 135 11/04/2014 0435   NA  141 04/04/2012 0945   K 3.8 11/28/2014 1320   K 3.8 11/04/2014 0435   K 4.7 04/04/2012 0945   CL 100 11/04/2014 0435   CL 101 04/06/2013 1041   CL 100 04/04/2012 0945   CO2 22 11/28/2014 1320   CO2 27 11/04/2014 0435   CO2 29 04/04/2012 0945   BUN 5.6* 11/28/2014 1320   BUN 8 11/04/2014 0435   BUN 16 04/04/2012 0945   CREATININE 0.8 11/28/2014 1320   CREATININE 0.61 11/04/2014 0435   CREATININE 0.9 04/04/2012 0945      Component Value Date/Time   CALCIUM 8.3* 11/28/2014 1320   CALCIUM 8.2* 11/04/2014 0435   CALCIUM 8.4 04/04/2012 0945   ALKPHOS 80 11/28/2014 1320   ALKPHOS 69 11/04/2014 0435   ALKPHOS 61 04/04/2012 0945   AST 20 11/28/2014 1320   AST 19 11/04/2014 0435   AST 31 04/04/2012 0945   ALT 18 11/28/2014 1320   ALT 13 11/04/2014 0435   ALT 30 04/04/2012 0945   BILITOT 0.23 11/28/2014 1320   BILITOT 0.6 11/04/2014 0435   BILITOT 0.50 04/04/2012 0945       RADIOGRAPHIC STUDIES: Ct Angio Chest Pe W/cm &/or Wo Cm  11/03/2014   CLINICAL DATA:  Increased chest pain and SOB s/p recent stent placement. 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 History of COPD and lung cancer (receives chemo).Nurse note: Pt reports chest pain and sob intermittent over last few days. But consistent since last night. Sob worse with exertion. Had ?pulmonary artery stent placed 2 weeks ago. Sts "I'm not sure if the stent is even working, it feels the same as it did before the stent. 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 Denies n/v, dizziness.  EXAM: CT ANGIOGRAPHY CHEST WITH CONTRAST  TECHNIQUE: Multidetector CT imaging of the chest was performed using the standard protocol during bolus administration of intravenous contrast. Multiplanar CT image reconstructions and MIPs were obtained to evaluate the vascular anatomy.  CONTRAST:  100 cc Omnipaque 350  COMPARISON:  Chest CT dated 09/02/2014.  FINDINGS: The previously demonstrated right hilar mass is larger. The posterior extension  previously measured 4.7 x 2.6 cm and currently measures 6.4 x 3.3 cm with increased vascular and bronchial encasement. The previously demonstrated 5.0 x 2.8 cm precarinal mediastinal component currently measures 5.5 x 2.3 cm. By my measurements, this previously measure 5.0 x 2.1 cm and corresponding dimensions.  A previously demonstrated 6 mm short axis right hilar lymph node is increased in size, currently A 10.5 mm short axis on image number 43. No enlarged lymph nodes elsewhere in the chest or upper abdomen.  There  is a minimal pericardial effusion with a maximum thickness of 6 mm. There are multiple small, sub cm nodules in both lungs. Some of these are new and some are enlarging. An old, healed right anterolateral rib fracture is again demonstrated. No bone metastases are visualized.  The pulmonary arteries are normally opacified with no pulmonary arterial filling defects seen. Moderate thoracic scoliosis is noted.  Review of the MIP images confirms the above findings.  IMPRESSION: 1.  No pulmonary emboli.  2. Enlarging right hilar and mediastinal lung cancer with increased vascular and bronchial encasement.  3. Multiple sub cm new and enlarging nodules in both lungs. These are suspicious for early metastases.  4.  Progressive right hilar metastatic adenopathy.   Electronically Signed   By: Enrique Sack M.D.   On: 11/03/2014 14:10   ASSESSMENT AND PLAN: This is a very pleasant 59 years old white female with recurrent non-small cell lung cancer status post concurrent chemoradiation as well as consolidation chemotherapy and she is currently on systemic chemotherapy with single agent gemcitabine status post 4 cycles.  She was treated again with a course of systemic chemotherapy with single agent gemcitabine for 3 cycles but the patient has rough time tolerating her treatment with significant fatigue and weakness. The recent CT scan of the chest, abdomen and pelvis showed stable disease but the patient continues  to have the persistent right upper lobe area of mass fibrosis and consolidation as well as persistent right hilar, subcarinal and right paratracheal lymphadenopathy as well as, one area of masslike soft tissue immediately anterior to the carina. She was started on treatment with Nivolumab status post 2 cycles. She tolerated the treatment well but she presented with evidence for disease progression in the chest. This treatment was discontinued. 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 Also the most recent CT angiogram of the chest showed further disease progression. She is currently being treated with systemic chemotherapy with carboplatin for AUC of 5 on day 1 and gemcitabine 1000 MG/M2 on days 1 and 8 every 3 weeks. Status post 1 cycle. Patient was discussed with Dr. [name]. The segment left upper extremity edema is concerning for deep vein thrombosis. She'll be scheduled for left upper extremity Doppler to further evaluate this possibility. Should deep vein thromboses be present she will be started on appropriate anticoagulation therapy. She'll proceed with day 1 of cycle #2 of her systemic chemotherapy with carboplatin and gemcitabine today as scheduled. She will follow-up in 3 weeks prior to the start of cycle #3.  She was advised to call immediately if she has any concerning symptoms in the interval.  All questions were answered. The patient knows to call the clinic with any problems, questions or concerns. We can certainly see the patient much sooner if necessary.  Carlton Adam, PA-C    Disclaimer: This note was dictated with voice recognition software. Similar sounding words can inadvertently be transcribed and may not be corrected upon review.   Addendum: Patient had a reaction to her carboplatin test dose and carboplatin was not given. She received gemcitabine only for day 1 of cycle #2.  Wynetta Emery, Lonnette Shrode E, PA-C

## 2014-11-28 NOTE — Progress Notes (Signed)
Carbo test dose placed on right forearm.  RN checked site at 5 minutes with negative results.  At 15 minutes area was red and itching.  At 20 minutes area still red and itching.  RN notified Hilario Quarry RN and she agreed with findings.  Awilda Metro PA-C alerted.  After looking at site [place] gave order to hold carbo dose today.  Gemzar given per orders.  At thirty minutes site still red and slightly inflamed.  Pt states it isn't as itchy.

## 2014-11-29 ENCOUNTER — Ambulatory Visit (HOSPITAL_COMMUNITY)
Admission: RE | Admit: 2014-11-29 | Discharge: 2014-11-29 | Disposition: A | Discharge status: Home / Self Care | Payer: Medicare Other | Source: Ambulatory Visit | Attending: Physician Assistant | Admitting: Physician Assistant

## 2014-11-29 ENCOUNTER — Encounter: Payer: Self-pay | Admitting: Physician Assistant

## 2014-11-29 ENCOUNTER — Ambulatory Visit (HOSPITAL_COMMUNITY): Payer: Medicare Other

## 2014-11-29 ENCOUNTER — Telehealth: Payer: Self-pay | Admitting: Internal Medicine

## 2014-11-29 ENCOUNTER — Other Ambulatory Visit (HOSPITAL_COMMUNITY): Payer: Self-pay | Admitting: Nurse Practitioner

## 2014-11-29 ENCOUNTER — Encounter: Payer: Self-pay | Admitting: *Deleted

## 2014-11-29 DIAGNOSIS — M7989 Other specified soft tissue disorders: Secondary | ICD-10-CM

## 2014-11-29 DIAGNOSIS — I871 Compression of vein: Secondary | ICD-10-CM | POA: Diagnosis not present

## 2014-11-29 DIAGNOSIS — R0602 Shortness of breath: Secondary | ICD-10-CM | POA: Diagnosis not present

## 2014-11-29 NOTE — Progress Notes (Signed)
Pt instructed on Lovenox injection. Information packet given to patient and demonstrated injection. Pt verbalized understanding. 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

## 2014-11-29 NOTE — Progress Notes (Signed)
Patient returned today to have a left upper extremity Doppler to be evaluated for possible deep vein thrombosis. Vascular tech phoned me with positive results. Patient had DVT affecting the brachial veins in the left upper extremity. Patient returned to the oncology clinic and was started on Lovenox at 1.5 mg/kg which equaled 120 mg subcutaneously daily. We'll discuss with Dr. [name] when he returns to the office next week as to whether the patient will remain on Lovenox for the next 3-6 months versus transitioning to Xarelto. Patient is aware and is in agreement with the current plan. She was advised to seek emergency room evaluation should she have any concerning signs and symptoms occur over the weekend  Carlton Adam, PA-C 11/29/2014

## 2014-11-29 NOTE — Patient Instructions (Signed)
Return on 11/29/2014 for a Doppler of your left upper extremity to evaluate for possible deep vein thrombosis Continue labs and chemotherapy as scheduled Follow-up in 3 weeks

## 2014-11-29 NOTE — Telephone Encounter (Signed)
s.w. pt and advised on added appts....pt will pick up new sched at next visit

## 2014-11-29 NOTE — Progress Notes (Signed)
*  Preliminary Results* Left upper extremity venous duplex completed. Left upper extremity is positive for deep vein thrombosis involving the left brachial veins.  Preliminary results discussed with Awilda Metro, PA-C.  11/29/2014 2:44 PM  Maudry Mayhew, RVT, RDCS, RDMS

## 2014-11-30 ENCOUNTER — Inpatient Hospital Stay (HOSPITAL_COMMUNITY)
Admission: EM | Admit: 2014-11-30 | Discharge: 2014-12-06 | DRG: 253 | Disposition: A | Discharge status: Home / Self Care | Payer: Medicare Other | Attending: Internal Medicine | Admitting: Internal Medicine

## 2014-11-30 ENCOUNTER — Emergency Department (HOSPITAL_COMMUNITY): Payer: Medicare Other

## 2014-11-30 ENCOUNTER — Other Ambulatory Visit (HOSPITAL_COMMUNITY): Payer: Self-pay

## 2014-11-30 ENCOUNTER — Encounter (HOSPITAL_COMMUNITY): Payer: Self-pay | Admitting: Emergency Medicine

## 2014-11-30 ENCOUNTER — Other Ambulatory Visit: Payer: Self-pay

## 2014-11-30 DIAGNOSIS — J449 Chronic obstructive pulmonary disease, unspecified: Secondary | ICD-10-CM | POA: Diagnosis present

## 2014-11-30 DIAGNOSIS — Z87891 Personal history of nicotine dependence: Secondary | ICD-10-CM

## 2014-11-30 DIAGNOSIS — L0390 Cellulitis, unspecified: Secondary | ICD-10-CM | POA: Insufficient documentation

## 2014-11-30 DIAGNOSIS — I82A12 Acute embolism and thrombosis of left axillary vein: Secondary | ICD-10-CM | POA: Diagnosis present

## 2014-11-30 DIAGNOSIS — Z923 Personal history of irradiation: Secondary | ICD-10-CM | POA: Diagnosis not present

## 2014-11-30 DIAGNOSIS — I82290 Acute embolism and thrombosis of other thoracic veins: Secondary | ICD-10-CM | POA: Diagnosis present

## 2014-11-30 DIAGNOSIS — T45615A Adverse effect of thrombolytic drugs, initial encounter: Secondary | ICD-10-CM | POA: Diagnosis not present

## 2014-11-30 DIAGNOSIS — E039 Hypothyroidism, unspecified: Secondary | ICD-10-CM | POA: Diagnosis present

## 2014-11-30 DIAGNOSIS — E119 Type 2 diabetes mellitus without complications: Secondary | ICD-10-CM

## 2014-11-30 DIAGNOSIS — Z7982 Long term (current) use of aspirin: Secondary | ICD-10-CM

## 2014-11-30 DIAGNOSIS — Z79899 Other long term (current) drug therapy: Secondary | ICD-10-CM | POA: Diagnosis not present

## 2014-11-30 DIAGNOSIS — I82622 Acute embolism and thrombosis of deep veins of left upper extremity: Secondary | ICD-10-CM | POA: Diagnosis present

## 2014-11-30 DIAGNOSIS — R05 Cough: Secondary | ICD-10-CM

## 2014-11-30 DIAGNOSIS — E1165 Type 2 diabetes mellitus with hyperglycemia: Secondary | ICD-10-CM

## 2014-11-30 DIAGNOSIS — E785 Hyperlipidemia, unspecified: Secondary | ICD-10-CM | POA: Diagnosis present

## 2014-11-30 DIAGNOSIS — F419 Anxiety disorder, unspecified: Secondary | ICD-10-CM | POA: Diagnosis present

## 2014-11-30 DIAGNOSIS — I82B12 Acute embolism and thrombosis of left subclavian vein: Secondary | ICD-10-CM | POA: Diagnosis present

## 2014-11-30 DIAGNOSIS — L03114 Cellulitis of left upper limb: Secondary | ICD-10-CM | POA: Diagnosis not present

## 2014-11-30 DIAGNOSIS — R000 Tachycardia, unspecified: Secondary | ICD-10-CM | POA: Diagnosis present

## 2014-11-30 DIAGNOSIS — I82B11 Acute embolism and thrombosis of right subclavian vein: Secondary | ICD-10-CM | POA: Diagnosis present

## 2014-11-30 DIAGNOSIS — R0602 Shortness of breath: Secondary | ICD-10-CM

## 2014-11-30 DIAGNOSIS — R319 Hematuria, unspecified: Secondary | ICD-10-CM | POA: Diagnosis not present

## 2014-11-30 DIAGNOSIS — T45515A Adverse effect of anticoagulants, initial encounter: Secondary | ICD-10-CM | POA: Diagnosis not present

## 2014-11-30 DIAGNOSIS — R0902 Hypoxemia: Secondary | ICD-10-CM

## 2014-11-30 DIAGNOSIS — Z7951 Long term (current) use of inhaled steroids: Secondary | ICD-10-CM

## 2014-11-30 DIAGNOSIS — C3411 Malignant neoplasm of upper lobe, right bronchus or lung: Secondary | ICD-10-CM | POA: Diagnosis present

## 2014-11-30 DIAGNOSIS — I871 Compression of vein: Principal | ICD-10-CM | POA: Diagnosis present

## 2014-11-30 DIAGNOSIS — T451X5A Adverse effect of antineoplastic and immunosuppressive drugs, initial encounter: Secondary | ICD-10-CM

## 2014-11-30 DIAGNOSIS — F17200 Nicotine dependence, unspecified, uncomplicated: Secondary | ICD-10-CM

## 2014-11-30 DIAGNOSIS — D61810 Antineoplastic chemotherapy induced pancytopenia: Secondary | ICD-10-CM

## 2014-11-30 DIAGNOSIS — T85818A Embolism due to other internal prosthetic devices, implants and grafts, initial encounter: Secondary | ICD-10-CM

## 2014-11-30 DIAGNOSIS — I82C12 Acute embolism and thrombosis of left internal jugular vein: Secondary | ICD-10-CM | POA: Diagnosis present

## 2014-11-30 DIAGNOSIS — R059 Cough, unspecified: Secondary | ICD-10-CM

## 2014-11-30 LAB — PROTIME-INR
INR: 1.14 (ref 0.00–1.49)
PROTHROMBIN TIME: 14.7 s (ref 11.6–15.2)

## 2014-11-30 LAB — CBC
HCT: 32.7 % — ABNORMAL LOW (ref 36.0–46.0)
Hemoglobin: 10.3 g/dL — ABNORMAL LOW (ref 12.0–15.0)
MCH: 26.4 pg (ref 26.0–34.0)
MCHC: 31.5 g/dL (ref 30.0–36.0)
MCV: 83.8 fL (ref 78.0–100.0)
PLATELETS: 226 10*3/uL (ref 150–400)
RBC: 3.9 MIL/uL (ref 3.87–5.11)
RDW: 16.7 % — ABNORMAL HIGH (ref 11.5–15.5)
WBC: 4 10*3/uL (ref 4.0–10.5)

## 2014-11-30 LAB — MRSA PCR SCREENING: MRSA by PCR: NEGATIVE

## 2014-11-30 LAB — BRAIN NATRIURETIC PEPTIDE: B Natriuretic Peptide: 29.9 pg/mL (ref 0.0–100.0)

## 2014-11-30 LAB — BASIC METABOLIC PANEL
Anion gap: 9 (ref 5–15)
BUN: 7 mg/dL (ref 6–23)
CALCIUM: 8.1 mg/dL — AB (ref 8.4–10.5)
CO2: 26 mmol/L (ref 19–32)
Chloride: 102 mmol/L (ref 96–112)
Creatinine, Ser: 0.59 mg/dL (ref 0.50–1.10)
GFR calc Af Amer: >90 mL/min (ref >90)
GFR calc non Af Amer: >90 mL/min (ref >90)
GLUCOSE: 178 mg/dL — AB (ref 70–99)
Potassium: 4 mmol/L (ref 3.5–5.1)
SODIUM: 137 mmol/L (ref 135–145)

## 2014-11-30 LAB — APTT: aPTT: 30 seconds (ref 24–37)

## 2014-11-30 LAB — GLUCOSE, CAPILLARY: Glucose-Capillary: 171 mg/dL — ABNORMAL HIGH (ref 70–99)

## 2014-11-30 LAB — I-STAT TROPONIN, ED: Troponin i, poc: 0 ng/mL (ref 0.00–0.08)

## 2014-11-30 MED ORDER — ALBUTEROL SULFATE HFA 108 (90 BASE) MCG/ACT IN AERS: 1.0000 | INHALATION_SPRAY | Freq: Four times a day (QID) | RESPIRATORY_TRACT | Status: DC | PRN

## 2014-11-30 MED ORDER — INSULIN ASPART 100 UNIT/ML ~~LOC~~ SOLN
0.0000 [IU] | Freq: Three times a day (TID) | SUBCUTANEOUS | Status: DC
  Administered 2014-12-01 (×3): 2 [IU] via SUBCUTANEOUS
  Administered 2014-12-02: 3 [IU] via SUBCUTANEOUS
  Administered 2014-12-02: 2 [IU] via SUBCUTANEOUS
  Administered 2014-12-03: 1 [IU] via SUBCUTANEOUS
  Administered 2014-12-03 (×2): 2 [IU] via SUBCUTANEOUS
  Administered 2014-12-04: 3 [IU] via SUBCUTANEOUS
  Administered 2014-12-04 (×2): 2 [IU] via SUBCUTANEOUS
  Administered 2014-12-05: 1 [IU] via SUBCUTANEOUS
  Administered 2014-12-05: 7 [IU] via SUBCUTANEOUS
  Administered 2014-12-05: 1 [IU] via SUBCUTANEOUS
  Administered 2014-12-06: 2 [IU] via SUBCUTANEOUS
  Administered 2014-12-06: 3 [IU] via SUBCUTANEOUS

## 2014-11-30 MED ORDER — ALUM & MAG HYDROXIDE-SIMETH 200-200-20 MG/5ML PO SUSP: 30.0000 mL | Freq: Four times a day (QID) | ORAL | Status: DC | PRN

## 2014-11-30 MED ORDER — OXYCODONE HCL 5 MG PO TABS
5.0000 mg | ORAL_TABLET | ORAL | Status: DC | PRN
  Administered 2014-11-30 – 2014-12-06 (×18): 5 mg via ORAL
  Filled 2014-11-30 (×18): qty 1

## 2014-11-30 MED ORDER — SODIUM CHLORIDE 0.9 % IJ SOLN: 3.0000 mL | INTRAMUSCULAR | Status: DC | PRN

## 2014-11-30 MED ORDER — CETYLPYRIDINIUM CHLORIDE 0.05 % MT LIQD
7.0000 mL | Freq: Two times a day (BID) | OROMUCOSAL | Status: DC
  Administered 2014-11-30 – 2014-12-06 (×11): 7 mL via OROMUCOSAL

## 2014-11-30 MED ORDER — LEVOTHYROXINE SODIUM 125 MCG PO TABS
125.0000 ug | ORAL_TABLET | Freq: Every day | ORAL | Status: DC
  Administered 2014-12-01 – 2014-12-06 (×6): 125 ug via ORAL
  Filled 2014-11-30 (×8): qty 1

## 2014-11-30 MED ORDER — BUDESONIDE-FORMOTEROL FUMARATE 160-4.5 MCG/ACT IN AERO
2.0000 | INHALATION_SPRAY | Freq: Two times a day (BID) | RESPIRATORY_TRACT | Status: DC
  Administered 2014-12-01 – 2014-12-06 (×10): 2 via RESPIRATORY_TRACT
  Filled 2014-11-30 (×2): qty 6

## 2014-11-30 MED ORDER — HEPARIN (PORCINE) IN NACL 100-0.45 UNIT/ML-% IJ SOLN
950.0000 [IU]/h | INTRAMUSCULAR | Status: DC
  Administered 2014-11-30: 950 [IU]/h via INTRAVENOUS
  Filled 2014-11-30: qty 250

## 2014-11-30 MED ORDER — MORPHINE SULFATE 2 MG/ML IJ SOLN
2.0000 mg | INTRAMUSCULAR | Status: DC | PRN
Start: 2014-11-30 — End: 2014-12-06

## 2014-11-30 MED ORDER — ACETAMINOPHEN 325 MG PO TABS
650.0000 mg | ORAL_TABLET | Freq: Four times a day (QID) | ORAL | Status: DC | PRN
  Administered 2014-12-01: 650 mg via ORAL
  Filled 2014-11-30: qty 2

## 2014-11-30 MED ORDER — SODIUM CHLORIDE 0.9 % IV SOLN: 250.0000 mL | INTRAVENOUS | Status: DC | PRN

## 2014-11-30 MED ORDER — HEPARIN BOLUS VIA INFUSION
4000.0000 [IU] | Freq: Once | INTRAVENOUS | Status: AC
  Administered 2014-11-30: 4000 [IU] via INTRAVENOUS
  Filled 2014-11-30: qty 4000

## 2014-11-30 MED ORDER — GLIMEPIRIDE 2 MG PO TABS
2.0000 mg | ORAL_TABLET | Freq: Every day | ORAL | Status: DC
  Administered 2014-12-01 – 2014-12-06 (×6): 2 mg via ORAL
  Filled 2014-11-30 (×8): qty 1

## 2014-11-30 MED ORDER — GUAIFENESIN ER 600 MG PO TB12
1200.0000 mg | ORAL_TABLET | Freq: Two times a day (BID) | ORAL | Status: DC
  Administered 2014-11-30 – 2014-12-06 (×12): 1200 mg via ORAL
  Filled 2014-11-30 (×14): qty 2

## 2014-11-30 MED ORDER — ONDANSETRON HCL 4 MG PO TABS
4.0000 mg | ORAL_TABLET | Freq: Four times a day (QID) | ORAL | Status: DC | PRN
  Administered 2014-12-02: 4 mg via ORAL
  Filled 2014-11-30: qty 1

## 2014-11-30 MED ORDER — SODIUM CHLORIDE 0.9 % IJ SOLN
3.0000 mL | Freq: Two times a day (BID) | INTRAMUSCULAR | Status: DC
  Administered 2014-12-05: 3 mL via INTRAVENOUS

## 2014-11-30 MED ORDER — SERTRALINE HCL 50 MG PO TABS
150.0000 mg | ORAL_TABLET | Freq: Every day | ORAL | Status: DC
  Administered 2014-11-30 – 2014-12-05 (×6): 150 mg via ORAL
  Filled 2014-11-30 (×7): qty 1

## 2014-11-30 MED ORDER — LORAZEPAM 1 MG PO TABS
1.0000 mg | ORAL_TABLET | Freq: Three times a day (TID) | ORAL | Status: DC | PRN
  Administered 2014-11-30 – 2014-12-05 (×6): 1 mg via ORAL
  Filled 2014-11-30 (×6): qty 1

## 2014-11-30 MED ORDER — LEVALBUTEROL HCL 0.63 MG/3ML IN NEBU
0.6300 mg | INHALATION_SOLUTION | Freq: Four times a day (QID) | RESPIRATORY_TRACT | Status: DC | PRN
  Administered 2014-11-30 – 2014-12-04 (×6): 0.63 mg via RESPIRATORY_TRACT
  Filled 2014-11-30 (×6): qty 3

## 2014-11-30 MED ORDER — SPIRONOLACTONE 25 MG PO TABS
25.0000 mg | ORAL_TABLET | Freq: Every day | ORAL | Status: DC
  Administered 2014-12-01 – 2014-12-06 (×6): 25 mg via ORAL
  Filled 2014-11-30 (×7): qty 1

## 2014-11-30 MED ORDER — PROCHLORPERAZINE MALEATE 10 MG PO TABS
10.0000 mg | ORAL_TABLET | Freq: Four times a day (QID) | ORAL | Status: DC | PRN
  Filled 2014-11-30 (×2): qty 1

## 2014-11-30 MED ORDER — ONDANSETRON HCL 4 MG/2ML IJ SOLN: 4.0000 mg | Freq: Four times a day (QID) | INTRAMUSCULAR | Status: DC | PRN

## 2014-11-30 MED ORDER — ONDANSETRON HCL 4 MG/2ML IJ SOLN: 4.0000 mg | Freq: Three times a day (TID) | INTRAMUSCULAR | Status: DC | PRN

## 2014-11-30 MED ORDER — IOHEXOL 350 MG/ML SOLN
100.0000 mL | Freq: Once | INTRAVENOUS | Status: AC | PRN
  Administered 2014-11-30: 100 mL via INTRAVENOUS

## 2014-11-30 MED ORDER — HEPARIN (PORCINE) IN NACL 100-0.45 UNIT/ML-% IJ SOLN
12.0000 [IU]/kg/h | INTRAMUSCULAR | Status: DC
  Administered 2014-11-30: 12 [IU]/kg/h via INTRAVENOUS
  Filled 2014-11-30: qty 250

## 2014-11-30 MED ORDER — INSULIN ASPART 100 UNIT/ML ~~LOC~~ SOLN
0.0000 [IU] | Freq: Every day | SUBCUTANEOUS | Status: DC
  Administered 2014-12-04: 3 [IU] via SUBCUTANEOUS
  Administered 2014-12-05: 2 [IU] via SUBCUTANEOUS

## 2014-11-30 NOTE — ED Notes (Signed)
[facility] called@ 18:15

## 2014-11-30 NOTE — Progress Notes (Signed)
Pt arrived to floor via [facility]. A/o x 4. In NAD. VS stable at this time. Pt oriented to room.

## 2014-11-30 NOTE — ED Notes (Signed)
Unable to take report again.  Floor given number to return call.

## 2014-11-30 NOTE — ED Notes (Signed)
Hx of lung ca, had platelet transfusion a week ago for critical low platelet. Pt has had swelling in L arm on Thursday, pain has spread to neck. Pt also reports some sob and chest pain. Pt speaking in complete sentences with no difficulty.

## 2014-11-30 NOTE — Progress Notes (Signed)
ANTICOAGULATION CONSULT NOTE - Initial Consult  Pharmacy Consult for Heparin Indication: DVT  Allergies  Allergen Reactions  . Sulfonamide Derivatives Rash  . Codeine Other (See Comments)    hallucinations  . Dilaudid [Hydromorphone Hcl] Nausea And Vomiting and Other (See Comments)    Room started spinning.  Pt has been okay with low doses and nausea meds administered first  . Penicillins Other (See Comments)    Childhood allergy; reaction unknown  . Vicodin [Hydrocodone-Acetaminophen] Rash    Patient Measurements: Height: 5\' 7"  (170.2 cm) Weight: 179 lb 0.2 oz (81.2 kg) IBW/kg (Calculated) : 61.6  Vital Signs: Temp: 98.8 F (37.1 C) (02/13 2030) Temp Source: Oral (02/13 2030) BP: 153/78 mmHg (02/13 1900) Pulse Rate: 125 (02/13 1900)  Labs:  Recent Labs  11/28/14 1320 11/28/14 1320 11/30/14 1444 11/30/14 1450  HGB 11.1*  --   --  10.3*  HCT 34.6*  --   --  32.7*  PLT 207  --   --  226  APTT  --   --  30  --   LABPROT  --   --  14.7  --   INR  --   --  1.14  --   CREATININE  --  0.8  --  0.59    Estimated Creatinine Clearance: 81.9 mL/min (by C-G formula based on Cr of 0.59).   Medical History: Past Medical History  Diagnosis Date  . Anxiety   . Hyperlipidemia   . Hypothyroidism   . COPD (chronic obstructive pulmonary disease)   . Arthritis     thumbs  . 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 Diabetes mellitus   . Lung cancer dx'd 05/2009  . Lung cancer dx'd 09/2013    recurrence in LN  . 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     Assessment: 59 year old female with a history of small cell lung cancer undergoing chemotherapy started on Lovenox for DVT yesterday.  Now with an occlusion of her SVC stent -- ? Thrombolysis.  She was transferred from [facility] on heparin.  Goal of Therapy:  Heparin level 0.3-0.7 units/ml Monitor platelets by anticoagulation protocol: Yes   Plan:   Heparin 4000 units iv bolus Heparin drip at 950 units / hr (started at 1500 pm at [facility] Heparin level at 2300 pm Daily heparin level, CBC  Thank you. Anette Guarneri, PharmD [phone] 11/30/2014,8:32 PM

## 2014-11-30 NOTE — ED Notes (Signed)
Attempted to call report to [facility].  Told they will call back.  Told charge nurse needed to be made aware of transfer.

## 2014-11-30 NOTE — ED Provider Notes (Signed)
HeCSN: [id number]     Arrival date & time 11/30/14  1413 History   First MD Initiated Contact with Patient 11/30/14 1454     Chief Complaint  Patient presents with  . Arm Swelling  . Shortness of Breath  . chemo      (Consider location/radiation/quality/duration/timing/severity/associated sxs/prior Treatment) HPI Comments: The pt is a pleasant 59 y/o female with dx of Lung CA 5 years ago - has undergone SVC stenting 2/2 compression in past - has been on and off chemo for 5 years (port in R chest) and was dx with DVT in the LUE yesterday by [place] - she has been on lovenox starting yesterday but notes having increased pain and swelling in the arm today as well as feeling her voice change and feeling SOB and unable to lay down last night - she required blood transfusion due to chemo related dyscrasia last week.  She has no f/c/n/v.  She was coughing more last night.  Sx are severe, constant, gradually worsening.  Patient is a 58 y.o. female presenting with shortness of breath. The history is provided by the patient and medical records.  Shortness of Breath   Past Medical History  Diagnosis Date  . Anxiety   . Hyperlipidemia   . Hypothyroidism   . COPD (chronic obstructive pulmonary disease)   . Arthritis     thumbs  . 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 Diabetes mellitus   . Lung cancer dx'd 05/2009  . Lung cancer dx'd 09/2013    recurrence in LN  . 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 Thyroidectomy, partial    . Tonsillectomy    . Tubal ligation    . Partial hysterectomy     Family History  Problem Relation Age of Onset  . Colon cancer Mother   . Cancer Mother     colon  . Diabetes type II Father    History  Substance Use Topics  . Smoking status: Former Smoker -- 0.50 packs/day for 45 years    Types: Cigarettes    Quit date: 11/03/2014  . Smokeless  tobacco: Never Used     Comment: pt. started back smoking 10/18/2012, half a pack a day  . Alcohol Use: No   OB History    No data available     Review of Systems  Respiratory: Positive for shortness of breath.   All other systems reviewed and are negative.     Allergies  Sulfonamide derivatives; Codeine; Dilaudid; Penicillins; and Vicodin  Home Medications   Prior to Admission medications   Medication Sig Start Date End Date Taking? Authorizing Provider  acetaminophen (TYLENOL) 325 MG tablet Take 650 mg by mouth every 6 (six) hours as needed for mild pain or headache.    Yes Historical Provider, MD  albuterol (PROVENTIL HFA;VENTOLIN HFA) 108 (90 BASE) MCG/ACT inhaler Inhale 1 puff into the lungs every 6 (six) hours as needed for wheezing or shortness of breath.   Yes Historical Provider, MD  alendronate (FOSAMAX) 10 MG tablet Take 10 mg by mouth daily.  03/20/14  Yes Historical Provider, MD  aspirin 325 MG tablet Take 325 mg by mouth daily with breakfast.    Yes Historical Provider, MD  budesonide-formoterol (SYMBICORT) 160-4.5 MCG/ACT inhaler Inhale 2 puffs into the lungs 2 (two) times daily.   Yes Historical Provider, MD  cholecalciferol (VITAMIN D) 1000  UNITS tablet Take 1,000 Units by mouth every morning.    Yes Historical Provider, MD  cyanocobalamin 1000 MCG tablet Take 1,000 mcg by mouth every morning.    Yes Historical Provider, MD  enoxaparin (LOVENOX) 120 MG/0.8ML injection Inject 120 mg into the skin daily.   Yes Historical Provider, MD  glimepiride (AMARYL) 2 MG tablet Take 2 mg by mouth daily with breakfast.  12/21/13  Yes Historical Provider, MD  guaiFENesin (MUCINEX) 600 MG 12 hr tablet Take 1,200 mg by mouth 2 (two) times daily.   Yes Historical Provider, MD  ipratropium (ATROVENT) 0.02 % nebulizer solution Take 2.5 mLs (0.5 mg total) by nebulization 4 (four) times daily. 03/05/14  Yes Brand Males, MD  levothyroxine (SYNTHROID, LEVOTHROID) 125 MCG tablet Take 125 mcg  by mouth daily before breakfast.    Yes Historical Provider, MD  lidocaine-prilocaine (EMLA) cream Apply 1 application topically as needed. Apply to port 1 hr before chemo 11/06/14  Yes Curt Bears, MD  LORazepam (ATIVAN) 1 MG tablet Take 1 mg by mouth 3 (three) times daily as needed for anxiety.    Yes Historical Provider, MD  magnesium gluconate (MAGONATE) 500 MG tablet Take 500 mg by mouth every morning.    Yes Historical Provider, MD  metFORMIN (GLUCOPHAGE) 1000 MG tablet Take 1,000 mg by mouth 2 (two) times daily with a meal.   Yes Historical Provider, MD  Multiple Vitamin (MULTIVITAMIN WITH MINERALS) TABS tablet Take 1 tablet by mouth every morning.   Yes Historical Provider, MD  OVER THE COUNTER MEDICATION Take 1 capsule by mouth daily. MSM. For energy and joint pain   Yes Historical Provider, MD  oxyCODONE (OXY IR/ROXICODONE) 5 MG immediate release tablet Take 1 tablet (5 mg total) by mouth every 4 (four) hours as needed for moderate pain. 11/28/14  Yes Adrena E Johnson, PA-C  PROAIR HFA 108 (90 BASE) MCG/ACT inhaler INHALE 2 PUFFS INTO THE LUNGS EVERY 6 (SIX) HOURS AS NEEDED FOR WHEEZING OR SHORTNESS OF BREATH. 10/08/14  Yes Brand Males, MD  prochlorperazine (COMPAZINE) 10 MG tablet Take 1 tablet (10 mg total) by mouth every 6 (six) hours as needed for nausea or vomiting. 11/28/14  Yes Adrena E Johnson, PA-C  sertraline (ZOLOFT) 100 MG tablet Take 150 mg by mouth at bedtime.    Yes Historical Provider, MD  spironolactone (ALDACTONE) 25 MG tablet Take 25 mg by mouth every morning.    Yes Historical Provider, MD  docusate sodium 100 MG CAPS Take 100 mg by mouth 2 (two) times daily. Patient not taking: Reported on 11/28/2014 11/05/14   Reyne Dumas, MD   BP 118/70 mmHg  Pulse 120  Temp(Src) 98.1 F (36.7 C) (Oral)  Resp 24  SpO2 92% Physical Exam  Constitutional: She appears well-developed and well-nourished. No distress.  HENT:  Head: Normocephalic and atraumatic.  Mouth/Throat:  Oropharynx is clear and moist. No oropharyngeal exudate.  Eyes: Conjunctivae and EOM are normal. Pupils are equal, round, and reactive to light. Right eye exhibits no discharge. Left eye exhibits no discharge. No scleral icterus.  Neck: Normal range of motion. Neck supple. No JVD present. No thyromegaly present.  Cardiovascular: Regular rhythm, normal heart sounds and intact distal pulses.  Exam reveals no gallop and no friction rub.   No murmur heard. tachycardia  Pulmonary/Chest: Effort normal and breath sounds normal. No respiratory distress. She has no wheezes. She has no rales.  Abdominal: Soft. Bowel sounds are normal. She exhibits no distension and no mass. There is  no tenderness.  Musculoskeletal: Normal range of motion. She exhibits edema and tenderness.  ttp over the LUE from fingers to shoulder, dec ROM 2/2 pain and severe swelling / edema.  Lymphadenopathy:    She has no cervical adenopathy.  Neurological: She is alert. Coordination normal.  Skin: Skin is warm and dry. No rash noted. No erythema.  Psychiatric: She has a normal mood and affect. Her behavior is normal.  Nursing note and vitals reviewed.   ED Course  Procedures (including critical care time) Labs Review Labs Reviewed  CBC - Abnormal; Notable for the following:    Hemoglobin 10.3 (*)    HCT 32.7 (*)    RDW 16.7 (*)    All other components within normal limits  BASIC METABOLIC PANEL - Abnormal; Notable for the following:    Glucose, Bld 178 (*)    Calcium 8.1 (*)    All other components within normal limits  BRAIN NATRIURETIC PEPTIDE  APTT  PROTIME-INR  BASIC METABOLIC PANEL  CBC  I-STAT TROPOININ, ED    Imaging Review Ct Angio Chest Pe W/cm &/or Wo Cm  11/30/2014   ADDENDUM REPORT: 11/30/2014 16:59  ADDENDUM: Critical Value/emergent results were called by telephone at the time of interpretation on 11/30/2014 at 4:56 pm to Dr. Noemi Chapel , who verbally acknowledged these results.   Electronically  Signed   By: Genevie Ann M.D.   On: 11/30/2014 16:59   11/30/2014   CLINICAL DATA:  59 year old female with shortness of breath. Positive left upper extremity DVT. History of lung cancer and COPD.   Initial encounter.  EXAM: CT ANGIOGRAPHY CHEST WITH CONTRAST  TECHNIQUE: Multidetector CT imaging of the chest was performed using the standard protocol during bolus administration of intravenous contrast. Multiplanar CT image reconstructions and MIPs were obtained to evaluate the vascular anatomy.  CONTRAST:  156mL OMNIPAQUE IOHEXOL 350 MG/ML SOLN  COMPARISON:  Chest CTA 10/09/2014.  Common chest CTA 11/03/2014.  FINDINGS: Good contrast bolus timing in the pulmonary arterial tree. No pulmonary artery filling defect consistent with pulmonary embolus.  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 The right chest porta cath was injected common the catheter passed through a vascular stent of the SVC, which does also thrombosis (series 6, image 104). Some of the right upper lobe pulmonary arteries are significantly narrowed by tumor (image when 18).  At the same time mediastinal tumor bulk has slightly regressed since 10/09/2014 (such as on series 4, image 35 measuring 48 x 43 mm canal versus 57 x 59 mm at a comparable level previously).  No pericardial or pleural effusion. Narrowing of the carina and central right lung airways has not significantly changed. Spiculated peribronchovascular opacity in the right upper lobe is stable to slightly regressed. No areas of worsening ventilation.  Stable visualized aorta and great vessels with atherosclerosis. Stable visualized upper abdominal viscera. No acute osseous abnormality identified.  Review of the MIP images confirms the above findings.  IMPRESSION: 1. 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 And also, there is continued  narrowing of right upper lobe pulmonary arteries by mediastinal tumor. 2. Tumor bulky is stable to slightly regressed since December. 3. No new chest abnormality identified.  Electronically Signed: By: Genevie Ann M.D. On: 11/30/2014 16:53   Dg Chest Port 1 View  11/30/2014   CLINICAL DATA:  Lung cancer with bilateral upper extremity  edema  EXAM: PORTABLE CHEST - 1 VIEW  COMPARISON:  Chest radiograph October 09, 2014 and chest CT November 03, 2014  FINDINGS: The previously noted lung carcinoma in the right upper lobe is again present measuring approximately 4.2 x 3.4 cm. Is tumor appears to extend into the right hilum, unchanged. Lungs elsewhere clear. Heart size and pulmonary vascularity are normal. No adenopathy. Port-A-Cath tip is in the superior vena cava. No pneumothorax. There is a stent in the superior vena cava region, stable.  IMPRESSION: Persistent right upper lobe mass extending in the right hilum. Stent in superior vena cava. No new opacity. No change in cardiac silhouette.   Electronically Signed   By: Lowella Grip III M.D.   On: 11/30/2014 15:49    ED ECG REPORT  I personally interpreted this EKG   Date: 11/30/2014   Rate: 122  Rhythm: sinus tachycardia  QRS Axis: normal  Intervals: normal  ST/T Wave abnormalities: normal  Conduction Disutrbances:none  Narrative Interpretation:   Old EKG Reviewed: unchanged since last other than rate slowed.   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  Sat's of 92% on my initial exam.  Pt appears critically ill.  My bedside US shows compressible veins of the neck above the clavicle and good pulsations in the carotid artery  Will d/w Oncology.  D/w Dr. Benay Spice - available as needed, heparin as I have  already ordered, no other acute advise.  D/w Dr. [place] Crane with radiology - agrees SVC stent has thrombosed, will admit to hospitalist.  Heparin gtt.  D/w Dr. Wynelle Cleveland who will admit - will touch base with vascular as well.  Dr. Bridgett Larsson recommends IR,   D/w Dr. Vernard Gambles - will see in the AM in IR.    CRITICAL CARE Performed by: Johnna Acosta Total critical care time: 35 Critical care time was exclusive of separately billable procedures and treating other patients. Critical care was necessary to treat or prevent imminent or life-threatening deterioration. Critical care was time spent personally by me on the following activities: development of treatment plan with patient and/or surrogate as well as nursing, discussions with consultants, evaluation of patient's response to treatment, examination of patient, obtaining history from patient or surrogate, ordering and performing treatments and interventions, ordering and review of laboratory studies, ordering and review of radiographic studies, pulse oximetry and re-evaluation of patient's condition.  Meds given in ED:  Medications  heparin ADULT infusion 100 units/mL (25000 units/250 mL) (12 Units/kg/hr  79.6 kg Intravenous New Bag/Given 11/30/14 1624)  sodium chloride 0.9 % injection 3 mL (not administered)  sodium chloride 0.9 % injection 3 mL (not administered)  0.9 %  sodium chloride infusion (not administered)  ondansetron (ZOFRAN) tablet 4 mg (not administered)    Or  ondansetron (ZOFRAN) injection 4 mg (not administered)  alum & mag hydroxide-simeth (MAALOX/MYLANTA) 200-200-20 MG/5ML suspension 30 mL (not administered)  morphine 2 MG/ML injection 2 mg (not administered)  acetaminophen (TYLENOL) tablet 650 mg (not administered)  albuterol (PROVENTIL HFA;VENTOLIN HFA) 108 (90 BASE) MCG/ACT inhaler 1 puff (not administered)  budesonide-formoterol (SYMBICORT) 160-4.5 MCG/ACT inhaler 2 puff (not administered)  guaiFENesin (MUCINEX) 12 hr  tablet 1,200 mg (not administered)  levothyroxine (SYNTHROID, LEVOTHROID) tablet 125 mcg (  not administered)  LORazepam (ATIVAN) tablet 1 mg (not administered)  oxyCODONE (Oxy IR/ROXICODONE) immediate release tablet 5 mg (not administered)  prochlorperazine (COMPAZINE) tablet 10 mg (not administered)  sertraline (ZOLOFT) tablet 150 mg (not administered)  spironolactone (ALDACTONE) tablet 25 mg (not administered)  glimepiride (AMARYL) tablet 2 mg (not administered)  insulin aspart (novoLOG) injection 0-9 Units (not administered)  insulin aspart (novoLOG) injection 0-5 Units (not administered)  heparin bolus via infusion 4,000 Units (0 Units Intravenous Stopped 11/30/14 1632)  iohexol (OMNIPAQUE) 350 MG/ML injection 100 mL (100 mLs Intravenous Contrast Given 11/30/14 1607)    New Prescriptions   No medications on file      Johnna Acosta, MD 11/30/14 1840

## 2014-11-30 NOTE — Progress Notes (Signed)
Report on patient taken at 18. Still awaiting [facility] for transport. No ETA for pt at this time.

## 2014-11-30 NOTE — H&P (Signed)
[facility] History and Physical  Janice Ball VHQ:[id number] DOB: [birth year] DOA: 11/30/2014   PCP: [name], NP    Chief Complaint: Swelling of her neck and shortness of breath  HPI: Janice Ball is a 59 y.o. female with history of small cell lung cancer diagnosed 5 years ago currently undergoing chemotherapy, hypothyroidism, COPD, diabetes mellitus, SVC stent who presents with "swelling of her neck" and shortness of breath. She noticed her left arm was swollen a few days ago and underwent a venous duplex which revealed a DVT in the right subclavian, left internal jugular, left subclavian left axillary left basophilic  cephalic radial and ulnar veins and left brachial She was started on Lovenox yesterday.   General: The patient denies anorexia, fever, weight loss Cardiac: Denies chest pain, syncope, palpitations, pedal edema  Respiratory: + cough, shortness of breath, no wheezing GI: Denies severe indigestion/heartburn, abdominal pain, diarrhea and constipation + nausea and vomiting this past week- mild nausea current GU: Denies hematuria, incontinence, dysuria  Musculoskeletal: Denies arthritis  Skin: Denies suspicious skin lesions Neurologic: Denies focal weakness or numbness, change in vision Psychiatry: + depression - + anxiety. Hematologic: + bruising or bleeding   Past Medical History  Diagnosis Date  . Anxiety   . Hyperlipidemia   . Hypothyroidism   . COPD (chronic obstructive pulmonary disease)   . Arthritis     thumbs  . 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 Diabetes mellitus   . Lung cancer dx'd 05/2009  . Lung cancer dx'd 09/2013    recurrence in LN  . 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 Thyroidectomy, partial    . Tonsillectomy    . Tubal ligation    . Partial hysterectomy      Social History: She no longer smokes, does not  drink any alcohol Lives at lives at home with her husband    Allergies  Allergen Reactions  . Sulfonamide Derivatives Rash  . Codeine Other (See Comments)    hallucinations  . Dilaudid [Hydromorphone Hcl] Nausea And Vomiting and Other (See Comments)    Room started spinning.  Pt has been okay with low doses and nausea meds administered first  . Penicillins Other (See Comments)    Childhood allergy; reaction unknown  . Vicodin [Hydrocodone-Acetaminophen] Rash    Family History  Problem Relation Age of Onset  . Colon cancer Mother   . Cancer Mother     colon  . Diabetes type II Father       Prior to Admission medications   Medication Sig Start Date End Date Taking? Authorizing Provider  acetaminophen (TYLENOL) 325 MG tablet Take 650 mg by mouth every 6 (six) hours as needed for mild pain or headache.    Yes Historical Provider, MD  albuterol (PROVENTIL HFA;VENTOLIN HFA) 108 (90 BASE) MCG/ACT inhaler Inhale 1 puff into the lungs every 6 (six) hours as needed for wheezing or shortness of breath.   Yes Historical Provider, MD  alendronate (FOSAMAX) 10 MG tablet Take 10 mg by mouth daily.  03/20/14  Yes Historical Provider, MD  aspirin 325 MG tablet Take 325 mg by mouth daily with breakfast.    Yes Historical Provider, MD  budesonide-formoterol (SYMBICORT) 160-4.5 MCG/ACT inhaler Inhale 2 puffs into the lungs 2 (two) times daily.   Yes Historical Provider, MD  cholecalciferol (VITAMIN D) 1000 UNITS tablet Take 1,000 Units  by mouth every morning.    Yes Historical Provider, MD  cyanocobalamin 1000 MCG tablet Take 1,000 mcg by mouth every morning.    Yes Historical Provider, MD  enoxaparin (LOVENOX) 120 MG/0.8ML injection Inject 120 mg into the skin daily.   Yes Historical Provider, MD  glimepiride (AMARYL) 2 MG tablet Take 2 mg by mouth daily with breakfast.  12/21/13  Yes Historical Provider, MD  guaiFENesin (MUCINEX) 600 MG 12 hr tablet Take 1,200 mg by mouth 2 (two) times daily.   Yes  Historical Provider, MD  ipratropium (ATROVENT) 0.02 % nebulizer solution Take 2.5 mLs (0.5 mg total) by nebulization 4 (four) times daily. 03/05/14  Yes Brand Males, MD  levothyroxine (SYNTHROID, LEVOTHROID) 125 MCG tablet Take 125 mcg by mouth daily before breakfast.    Yes Historical Provider, MD  lidocaine-prilocaine (EMLA) cream Apply 1 application topically as needed. Apply to port 1 hr before chemo 11/06/14  Yes Curt Bears, MD  LORazepam (ATIVAN) 1 MG tablet Take 1 mg by mouth 3 (three) times daily as needed for anxiety.    Yes Historical Provider, MD  magnesium gluconate (MAGONATE) 500 MG tablet Take 500 mg by mouth every morning.    Yes Historical Provider, MD  metFORMIN (GLUCOPHAGE) 1000 MG tablet Take 1,000 mg by mouth 2 (two) times daily with a meal.   Yes Historical Provider, MD  Multiple Vitamin (MULTIVITAMIN WITH MINERALS) TABS tablet Take 1 tablet by mouth every morning.   Yes Historical Provider, MD  OVER THE COUNTER MEDICATION Take 1 capsule by mouth daily. MSM. For energy and joint pain   Yes Historical Provider, MD  oxyCODONE (OXY IR/ROXICODONE) 5 MG immediate release tablet Take 1 tablet (5 mg total) by mouth every 4 (four) hours as needed for moderate pain. 11/28/14  Yes Adrena E Johnson, PA-C  PROAIR HFA 108 (90 BASE) MCG/ACT inhaler INHALE 2 PUFFS INTO THE LUNGS EVERY 6 (SIX) HOURS AS NEEDED FOR WHEEZING OR SHORTNESS OF BREATH. 10/08/14  Yes Brand Males, MD  prochlorperazine (COMPAZINE) 10 MG tablet Take 1 tablet (10 mg total) by mouth every 6 (six) hours as needed for nausea or vomiting. 11/28/14  Yes Adrena E Johnson, PA-C  sertraline (ZOLOFT) 100 MG tablet Take 150 mg by mouth at bedtime.    Yes Historical Provider, MD  spironolactone (ALDACTONE) 25 MG tablet Take 25 mg by mouth every morning.    Yes Historical Provider, MD  docusate sodium 100 MG CAPS Take 100 mg by mouth 2 (two) times daily. Patient not taking: Reported on 11/28/2014 11/05/14   Reyne Dumas, MD      Physical Exam: Filed Vitals:   11/30/14 1421 11/30/14 1700  BP: 128/68 118/70  Pulse: 131 120  Temp: 98.1 F (36.7 C)   TempSrc: Oral   Resp: 20 24  SpO2: 94% 92%     General: Weight alert oriented 3, no acute distress HEENT: Normocephalic and Atraumatic, Mucous membranes pink                PERRLA; EOM intact; No scleral icterus,                 Nares: Patent, Oropharynx: Clear, Fair Dentition                 Neck: FROM, no cervical lymphadenopathy, thyromegaly, carotid bruit or JVD;  Breasts: deferred CHEST WALL: No tenderness  CHEST: Normal respiration, clear to auscultation bilaterally  HEART: Regular rate and rhythm; no murmurs rubs or gallops - tachycardic  with heart rates in the 1 teens to 120s BACK: No kyphosis or scoliosis; no CVA tenderness  GI: Positive Bowel Sounds, soft, non-tender; no masses, no organomegaly Rectal Exam: deferred MSK: No cyanosis, clubbing, or edema Genitalia: not examined  SKIN:  no rash or ulceration  CNS: Alert and Oriented x 4, Nonfocal exam, CN 2-12 intact  Labs on Admission:  Basic Metabolic Panel:  Recent Labs Lab 11/28/14 1320 11/30/14 1450  NA 140 137  K 3.8 4.0  CL  --  102  CO2 22 26  GLUCOSE 219* 178*  BUN 5.6* 7  CREATININE 0.8 0.59  CALCIUM 8.3* 8.1*   Liver Function Tests:  Recent Labs Lab 11/28/14 1320  AST 20  ALT 18  ALKPHOS 80  BILITOT 0.23  PROT 6.9  ALBUMIN 3.6   No results for input(s): LIPASE, AMYLASE in the last 168 hours. No results for input(s): AMMONIA in the last 168 hours. CBC:  Recent Labs Lab 11/28/14 1320 11/30/14 1450  WBC 5.4 4.0  NEUTROABS 3.4  --   HGB 11.1* 10.3*  HCT 34.6* 32.7*  MCV 83.6 83.8  PLT 207 226   Cardiac Enzymes: No results for input(s): CKTOTAL, CKMB, CKMBINDEX, TROPONINI in the last 168 hours.  BNP (last 3 results)  Recent Labs  10/09/14 1518 11/03/14 1222 11/30/14 1450  BNP 23.5 22.1 29.9    ProBNP (last 3 results) No results for input(s):  PROBNP in the last 8760 hours.  CBG: No results for input(s): GLUCAP in the last 168 hours.  Radiological Exams on Admission: Ct Angio Chest Pe W/cm &/or Wo Cm  11/30/2014   ADDENDUM REPORT: 11/30/2014 16:59  ADDENDUM: Critical Value/emergent results were called by telephone at the time of interpretation on 11/30/2014 at 4:56 pm to Dr. Noemi Chapel , who verbally acknowledged these results.   Electronically Signed   By: Genevie Ann M.D.   On: 11/30/2014 16:59   11/30/2014   CLINICAL DATA:  59 year old female with shortness of breath. Positive left upper extremity DVT. History of lung cancer and COPD.   Initial encounter.  EXAM: CT ANGIOGRAPHY CHEST WITH CONTRAST  TECHNIQUE: Multidetector CT imaging of the chest was performed using the standard protocol during bolus administration of intravenous contrast. Multiplanar CT image reconstructions and MIPs were obtained to evaluate the vascular anatomy.  CONTRAST:  173mL OMNIPAQUE IOHEXOL 350 MG/ML SOLN  COMPARISON:  Chest CTA 10/09/2014.  Common chest CTA 11/03/2014.  FINDINGS: Good contrast bolus timing in the pulmonary arterial tree. No pulmonary artery filling defect consistent with pulmonary embolus.  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 The right chest porta cath was injected common the catheter passed through a vascular stent of the SVC, which does also thrombosis (series 6, image 104). Some of the right upper lobe pulmonary arteries are significantly narrowed by tumor (image when 18).  At the same time mediastinal tumor bulk has slightly regressed since 10/09/2014 (such as on series 4, image 35 measuring 48 x 43 mm canal versus 57 x 59 mm at a comparable level previously).  No pericardial or pleural effusion. Narrowing of the carina and central right lung airways has not significantly changed. Spiculated peribronchovascular opacity in the right upper lobe is stable  to slightly regressed. No areas of worsening ventilation.  Stable visualized aorta and great vessels with atherosclerosis. Stable visualized upper abdominal viscera. No acute osseous abnormality identified.  Review of the MIP images confirms  the above findings.  IMPRESSION: 1. 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 2. Tumor bulky is stable to slightly regressed since December. 3. No new chest abnormality identified.  Electronically Signed: By: Genevie Ann M.D. On: 11/30/2014 16:53   Dg Chest Port 1 View  11/30/2014   CLINICAL DATA:  Lung cancer with bilateral upper extremity edema  EXAM: PORTABLE CHEST - 1 VIEW  COMPARISON:  Chest radiograph October 09, 2014 and chest CT November 03, 2014  FINDINGS: The previously noted lung carcinoma in the right upper lobe is again present measuring approximately 4.2 x 3.4 cm. Is tumor appears to extend into the right hilum, unchanged. Lungs elsewhere clear. Heart size and pulmonary vascularity are normal. No adenopathy. Port-A-Cath tip is in the superior vena cava. No pneumothorax. There is a stent in the superior vena cava region, stable.  IMPRESSION: Persistent right upper lobe mass extending in the right hilum. Stent in superior vena cava. No new opacity. No change in cardiac silhouette.   Electronically Signed   By: Lowella Grip III M.D.   On: 11/30/2014 15:49    EKG: Independently reviewed. 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 - will be discussing with interventional radiology to see if a thrombectomy thrombo-lysis can be performed-for now she has been started on IV heparin  Active Problems:   Primary cancer of right upper lobe of lung- small cell cancer -Outpatient management per oncology-he is been receiving chemotherapy  and is through with radiation    DM type 2 (diabetes mellitus, type 2) -We'll resume Amaryl-hold metformin-place on low-dose insulin sliding scale   Consulted: Interventional radiology   Code Status: Full code Family Communication:  DVT Prophylaxis: Heparin infusion  Time spent: 45 minutes  [place], MD [facility]  If 7PM-7AM, please contact night-coverage [email or web address] 11/30/2014, 5:56 PM

## 2014-12-01 ENCOUNTER — Inpatient Hospital Stay (HOSPITAL_COMMUNITY): Payer: Medicare Other

## 2014-12-01 LAB — CBC
HEMATOCRIT: 32 % — AB (ref 36.0–46.0)
HEMOGLOBIN: 10.2 g/dL — AB (ref 12.0–15.0)
MCH: 26.8 pg (ref 26.0–34.0)
MCHC: 31.9 g/dL (ref 30.0–36.0)
MCV: 84.2 fL (ref 78.0–100.0)
PLATELETS: 209 10*3/uL (ref 150–400)
RBC: 3.8 MIL/uL — AB (ref 3.87–5.11)
RDW: 16.7 % — ABNORMAL HIGH (ref 11.5–15.5)
WBC: 4.4 10*3/uL (ref 4.0–10.5)

## 2014-12-01 LAB — GLUCOSE, CAPILLARY
GLUCOSE-CAPILLARY: 160 mg/dL — AB (ref 70–99)
Glucose-Capillary: 171 mg/dL — ABNORMAL HIGH (ref 70–99)
Glucose-Capillary: 154 mg/dL — ABNORMAL HIGH (ref 70–99)
Glucose-Capillary: 169 mg/dL — ABNORMAL HIGH (ref 70–99)

## 2014-12-01 LAB — HEPARIN LEVEL (UNFRACTIONATED)
Heparin Unfractionated: <0.1 IU/mL — ABNORMAL LOW (ref 0.30–0.70)
Heparin Unfractionated: 0.44 IU/mL (ref 0.30–0.70)

## 2014-12-01 MED ORDER — SODIUM CHLORIDE 0.9 % IV SOLN
10.0000 mg | Freq: Once | INTRAVENOUS | Status: AC
  Administered 2014-12-01: 10 mg via INTRAVENOUS
  Filled 2014-12-01: qty 10

## 2014-12-01 MED ORDER — IOHEXOL 300 MG/ML  SOLN
100.0000 mL | Freq: Once | INTRAMUSCULAR | Status: AC | PRN
  Administered 2014-12-01: 55 mL via INTRAVENOUS

## 2014-12-01 MED ORDER — ENOXAPARIN SODIUM 80 MG/0.8ML ~~LOC~~ SOLN
1.0000 mg/kg | Freq: Two times a day (BID) | SUBCUTANEOUS | Status: DC
  Administered 2014-12-01: 80 mg via SUBCUTANEOUS
  Filled 2014-12-01 (×2): qty 0.8

## 2014-12-01 MED ORDER — LIDOCAINE HCL 1 % IJ SOLN
INTRAMUSCULAR | Status: AC
  Filled 2014-12-01: qty 20

## 2014-12-01 MED ORDER — HYDROCODONE-ACETAMINOPHEN 5-325 MG PO TABS
1.0000 | ORAL_TABLET | ORAL | Status: DC | PRN
Start: 2014-12-01 — End: 2014-12-06

## 2014-12-01 MED ORDER — MIDAZOLAM HCL 2 MG/2ML IJ SOLN
INTRAMUSCULAR | Status: AC | PRN
  Administered 2014-12-01: 1 mg via INTRAVENOUS

## 2014-12-01 MED ORDER — FENTANYL CITRATE 0.05 MG/ML IJ SOLN
INTRAMUSCULAR | Status: AC
  Filled 2014-12-01: qty 4

## 2014-12-01 MED ORDER — GUAIFENESIN-DM 100-10 MG/5ML PO SYRP
10.0000 mL | ORAL_SOLUTION | ORAL | Status: DC | PRN
  Administered 2014-12-01 – 2014-12-06 (×10): 10 mL via ORAL
  Filled 2014-12-01 (×11): qty 10

## 2014-12-01 MED ORDER — FENTANYL CITRATE 0.05 MG/ML IJ SOLN
INTRAMUSCULAR | Status: AC | PRN
  Administered 2014-12-01: 25 ug via INTRAVENOUS
  Administered 2014-12-01: 50 ug via INTRAVENOUS

## 2014-12-01 MED ORDER — LORAZEPAM 2 MG/ML IJ SOLN
INTRAMUSCULAR | Status: AC | PRN
  Administered 2014-12-01: 0.5 mg via INTRAVENOUS

## 2014-12-01 MED ORDER — HEPARIN BOLUS VIA INFUSION
2500.0000 [IU] | Freq: Once | INTRAVENOUS | Status: AC
  Administered 2014-12-01: 2500 [IU] via INTRAVENOUS
  Filled 2014-12-01: qty 2500

## 2014-12-01 MED ORDER — MIDAZOLAM HCL 2 MG/2ML IJ SOLN
INTRAMUSCULAR | Status: AC
  Filled 2014-12-01: qty 4

## 2014-12-01 MED ORDER — HEPARIN (PORCINE) IN NACL 100-0.45 UNIT/ML-% IJ SOLN
1250.0000 [IU]/h | INTRAMUSCULAR | Status: AC
  Administered 2014-12-01: 1250 [IU]/h via INTRAVENOUS
  Filled 2014-12-01 (×2): qty 250

## 2014-12-01 MED ORDER — ENOXAPARIN SODIUM 80 MG/0.8ML ~~LOC~~ SOLN
1.0000 mg/kg | Freq: Two times a day (BID) | SUBCUTANEOUS | Status: DC
  Filled 2014-12-01 (×3): qty 0.8

## 2014-12-01 NOTE — Progress Notes (Addendum)
ANTICOAGULATION CONSULT NOTE - Follow Up Consult  Pharmacy Consult for Heparin Indication: DVT  Allergies  Allergen Reactions  . Sulfonamide Derivatives Rash  . Codeine Other (See Comments)    hallucinations  . Dilaudid [Hydromorphone Hcl] Nausea And Vomiting and Other (See Comments)    Room started spinning.  Pt has been okay with low doses and nausea meds administered first  . Penicillins Other (See Comments)    Childhood allergy; reaction unknown  . Vicodin [Hydrocodone-Acetaminophen] Rash    Patient Measurements: Height: 5\' 7"  (170.2 cm) Weight: 179 lb 14.3 oz (81.6 kg) IBW/kg (Calculated) : 61.6 Heparin Dosing Weight: 77 kg  Vital Signs: Temp: 98.6 F (37 C) (02/14 0800) Temp Source: Oral (02/14 0800) BP: 129/66 mmHg (02/14 0800) Pulse Rate: 111 (02/14 0800)  Labs:  Recent Labs  11/28/14 1320 11/28/14 1320 11/30/14 1444 11/30/14 1450 11/30/14 2311 12/01/14 0428  HGB 11.1*  --   --  10.3*  --  10.2*  HCT 34.6*  --   --  32.7*  --  32.0*  PLT 207  --   --  226  --  209  APTT  --   --  30  --   --   --   LABPROT  --   --  14.7  --   --   --   INR  --   --  1.14  --   --   --   HEPARINUNFRC  --   --   --   --  <0.10* 0.44  CREATININE  --  0.8  --  0.59  --   --     Estimated Creatinine Clearance: 82.2 mL/min (by C-G formula based on Cr of 0.59).   Medications:  Prescriptions prior to admission  Medication Sig Dispense Refill Last Dose  . acetaminophen (TYLENOL) 325 MG tablet Take 650 mg by mouth every 6 (six) hours as needed for mild pain or headache.    11/29/2014 at Unknown time  . albuterol (PROVENTIL HFA;VENTOLIN HFA) 108 (90 BASE) MCG/ACT inhaler Inhale 1 puff into the lungs every 6 (six) hours as needed for wheezing or shortness of breath.   11/30/2014 at Unknown time  . alendronate (FOSAMAX) 10 MG tablet Take 10 mg by mouth daily.    11/30/2014 at Unknown time  . aspirin 325 MG tablet Take 325 mg by mouth daily with breakfast.    11/29/2014 at Unknown  time  . budesonide-formoterol (SYMBICORT) 160-4.5 MCG/ACT inhaler Inhale 2 puffs into the lungs 2 (two) times daily.   11/30/2014 at Unknown time  . cholecalciferol (VITAMIN D) 1000 UNITS tablet Take 1,000 Units by mouth every morning.    11/29/2014 at Unknown time  . cyanocobalamin 1000 MCG tablet Take 1,000 mcg by mouth every morning.    11/29/2014 at Unknown time  . enoxaparin (LOVENOX) 120 MG/0.8ML injection Inject 120 mg into the skin daily.   11/29/2014 at Unknown time  . glimepiride (AMARYL) 2 MG tablet Take 2 mg by mouth daily with breakfast.    11/29/2014 at Unknown time  . guaiFENesin (MUCINEX) 600 MG 12 hr tablet Take 1,200 mg by mouth 2 (two) times daily.   11/29/2014 at Unknown time  . ipratropium (ATROVENT) 0.02 % nebulizer solution Take 2.5 mLs (0.5 mg total) by nebulization 4 (four) times daily. 150 mL 4 11/30/2014 at Unknown time  . levothyroxine (SYNTHROID, LEVOTHROID) 125 MCG tablet Take 125 mcg by mouth daily before breakfast.    11/30/2014 at Unknown time  .  lidocaine-prilocaine (EMLA) cream Apply 1 application topically as needed. Apply to port 1 hr before chemo 30 g 0 11/28/2014  . LORazepam (ATIVAN) 1 MG tablet Take 1 mg by mouth 3 (three) times daily as needed for anxiety.    11/29/2014 at Unknown time  . magnesium gluconate (MAGONATE) 500 MG tablet Take 500 mg by mouth every morning.    11/29/2014 at Unknown time  . metFORMIN (GLUCOPHAGE) 1000 MG tablet Take 1,000 mg by mouth 2 (two) times daily with a meal.   11/29/2014 at Unknown time  . Multiple Vitamin (MULTIVITAMIN WITH MINERALS) TABS tablet Take 1 tablet by mouth every morning.   11/29/2014 at Unknown time  . OVER THE COUNTER MEDICATION Take 1 capsule by mouth daily. MSM. For energy and joint pain   a week  . oxyCODONE (OXY IR/ROXICODONE) 5 MG immediate release tablet Take 1 tablet (5 mg total) by mouth every 4 (four) hours as needed for moderate pain. 60 tablet 0 11/30/2014 at Unknown time  . PROAIR HFA 108 (90 BASE) MCG/ACT  inhaler INHALE 2 PUFFS INTO THE LUNGS EVERY 6 (SIX) HOURS AS NEEDED FOR WHEEZING OR SHORTNESS OF BREATH. 8.5 each 2 11/30/2014 at Unknown time  . prochlorperazine (COMPAZINE) 10 MG tablet Take 1 tablet (10 mg total) by mouth every 6 (six) hours as needed for nausea or vomiting. 30 tablet 1 11/28/2014  . sertraline (ZOLOFT) 100 MG tablet Take 150 mg by mouth at bedtime.    11/29/2014 at Unknown time  . spironolactone (ALDACTONE) 25 MG tablet Take 25 mg by mouth every morning.    11/29/2014 at Unknown time  . docusate sodium 100 MG CAPS Take 100 mg by mouth 2 (two) times daily. 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 Initial HL was undetectable, now HL is therapeutic at 0.44. No issues per nursing. Hgb 10.2, plts wnl.   Goal of Therapy:  Heparin level 0.3-0.7 units/ml Monitor platelets by anticoagulation protocol: Yes   Plan:  Continue heparin gtt at 1250 units/hr Check confirmatory level in 6 hrs at 1100 Monitor daily HL, CBC, s/s of bleed  Reginia Naas 12/01/2014,9:08 AM  Addendum: Pharmacy to dose Lovenox for DVT treatment after IR completes thrombectomy today. Will stop heparin gtt 1 hr before starting Lovenox.  Plan: Stop heparin gtt Start Lovenox 80mg  SQ Q12 1 hour after heparin stopped Monitor renal function, CBC, s/s of bleed

## 2014-12-01 NOTE — Procedures (Signed)
LUE and SVC venogram TPA assisted mechanical thrombectomy 30mm PTA of L innom vein and SVC stenoses No complication No blood loss. See complete dictation in [facility]

## 2014-12-01 NOTE — Sedation Documentation (Addendum)
Discussed decreasing sat with Dr Vernard Gambles.  O2 at 4l/[place]

## 2014-12-01 NOTE — Progress Notes (Signed)
Patient Demographics  Janice Ball, is a 59 y.o. female, DOB - May 28, 1956, JYN:[id number]  Admit date - 11/30/2014   Admitting Physician Debbe Odea, MD  Outpatient Primary MD for the patient is [name], NP  LOS - 1   Chief Complaint  Patient presents with  . Arm Swelling  . Shortness of Breath  . chemo         Subjective:   Janice Ball today has, No headache, No chest pain, No abdominal pain - No Nausea, No new weakness tingling or numbness, No Cough - SOB.    Assessment & Plan    1. 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 Currently on heparin drip, IR consulted to perform thrombectomy in the right SVC, postprocedure will switch her on Lovenox. If stable discharge in the morning with follow-up with oncologist Dr. [name]. Discussed over the phone with Dr. Bridgett Larsson vascular surgeon.   2. Right upper lobe non-small cell lung cancer. Undergoing repeat chemotherapy under Dr. [name]. Outpatient follow-up post discharge.   3. Baseline sinus tachycardia. Heart rate between 110 and 120 at baseline reviewed Dr. Worthy Flank office note. Stable.   4. Hypothyroidism. Continue Synthroid. Request PCP to check TSH 1 time on neck visit.   5. Diabetes mellitus type 2. Currently on sliding scale. We will give her home dose Amaryl once taking by mouth.  CBG (last 3)   Recent Labs  11/30/14 2159 12/01/14 0819  GLUCAP 171* 171*       Code Status: Full  Family Communication: None  Disposition Plan: Home   Procedures CT angiogram chest, venous duplex left arm, IR to perform thrombectomy on right SVC   Consults  IR, Vas Surg Dr Bridgett Larsson over the phone   Medications  Scheduled Meds: .  antiseptic oral rinse  7 mL Mouth Rinse BID  . budesonide-formoterol  2 puff Inhalation BID  . glimepiride  2 mg Oral Q breakfast  . guaiFENesin  1,200 mg Oral BID  . insulin aspart  0-5 Units Subcutaneous QHS  . insulin aspart  0-9 Units Subcutaneous TID WC  . levothyroxine  125 mcg Oral QAC breakfast  . sertraline  150 mg Oral QHS  . sodium chloride  3 mL Intravenous Q12H  . spironolactone  25 mg Oral Daily   Continuous Infusions: . heparin 1,250 Units/hr (12/01/14 0800)   PRN Meds:.sodium chloride, acetaminophen, alum & mag hydroxide-simeth, levalbuterol, LORazepam, morphine injection, ondansetron **OR** ondansetron (ZOFRAN) IV, oxyCODONE, prochlorperazine, sodium chloride  DVT Prophylaxis    Heparin  gtt  Lab Results  Component Value Date   PLT 209 12/01/2014    Antibiotics     Anti-infectives    None          Objective:   Filed Vitals:   11/30/14 2152 11/30/14 2313 12/01/14 0410 12/01/14 0800  BP:  140/83 134/71 129/66  Pulse:  117 121 111  Temp:  98.9 F (37.2 C) 99.1 F (37.3 C) 98.6 F (37 C)  TempSrc:  Oral Oral Oral  Resp:  18 23 16   Height:      Weight:      SpO2: 98% 99% 94% 96%  Wt Readings from Last 3 Encounters:  11/30/14 81.6 kg (179 lb 14.3 oz)  11/28/14 79.606 kg (175 lb 8 oz)  11/06/14 80.74 kg (178 lb)     Intake/Output Summary (Last 24 hours) at 12/01/14 0936 Last data filed at 12/01/14 0800  Gross per 24 hour  Intake 293.33 ml  Output    550 ml  Net -256.67 ml     Physical Exam  Awake Alert, Oriented X 3, No new F.N deficits, Normal affect [place].AT,PERRAL Supple Neck,No JVD, No cervical lymphadenopathy appriciated.  Symmetrical Chest wall movement, Good air movement bilaterally, CTAB RRR,No Gallops,Rubs or new Murmurs, No Parasternal Heave +ve B.Sounds, Abd Soft, No tenderness, No organomegaly appriciated, No rebound - guarding or rigidity. No Cyanosis, Clubbing or edema, No new Rash or bruise  , L arm swollen   Data  Review   Micro Results Recent Results (from the past 240 hour(s))  MRSA PCR Screening     Status: None   Collection Time: 11/30/14  8:52 PM  Result Value Ref Range Status   MRSA by PCR NEGATIVE NEGATIVE Final    Comment:        The GeneXpert MRSA Assay (FDA approved for NASAL specimens only), is one component of a comprehensive MRSA colonization surveillance program. It is not intended to diagnose MRSA infection nor to guide or monitor treatment for MRSA infections.     Radiology Reports Ct Angio Chest Pe W/cm &/or Wo Cm  11/30/2014   ADDENDUM REPORT: 11/30/2014 16:59  ADDENDUM: Critical Value/emergent results were called by telephone at the time of interpretation on 11/30/2014 at 4:56 pm to Dr. Noemi Chapel , who verbally acknowledged these results.   Electronically Signed   By: Genevie Ann M.D.   On: 11/30/2014 16:59   11/30/2014   CLINICAL DATA:  59 year old female with shortness of breath. Positive left upper extremity DVT. History of lung cancer and COPD.   Initial encounter.  EXAM: CT ANGIOGRAPHY CHEST WITH CONTRAST  TECHNIQUE: Multidetector CT imaging of the chest was performed using the standard protocol during bolus administration of intravenous contrast. Multiplanar CT image reconstructions and MIPs were obtained to evaluate the vascular anatomy.  CONTRAST:  11mL OMNIPAQUE IOHEXOL 350 MG/ML SOLN  COMPARISON:  Chest CTA 10/09/2014.  Common chest CTA 11/03/2014.  FINDINGS: Good contrast bolus timing in the pulmonary arterial tree. No pulmonary artery filling defect consistent with pulmonary embolus.  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 The right chest porta cath was injected common the catheter passed through a vascular stent of the SVC, which does also thrombosis (series 6, image 104). Some of the right upper lobe pulmonary arteries are significantly narrowed by tumor (image when 18).  At  the same time mediastinal tumor bulk has slightly regressed since 10/09/2014 (such as on series 4, image 35 measuring 48 x 43 mm canal versus 57 x 59 mm at a comparable level previously).  No pericardial or pleural effusion. Narrowing of the carina and central right lung airways has not significantly changed. Spiculated peribronchovascular opacity in the right upper lobe is stable to slightly regressed. No areas of worsening ventilation.  Stable visualized aorta and great vessels with atherosclerosis. Stable visualized upper abdominal viscera. No acute osseous abnormality identified.  Review of the MIP images confirms the above findings.  IMPRESSION: 1. 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 And also, there is  continued narrowing of right upper lobe pulmonary arteries by mediastinal tumor. 2. Tumor bulky is stable to slightly regressed since December. 3. No new chest abnormality identified.  Electronically Signed: By: Genevie Ann M.D. On: 11/30/2014 16:53   Ct Angio Chest Pe W/cm &/or Wo Cm  11/03/2014   CLINICAL DATA:  Increased chest pain and SOB s/p recent stent placement. 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 History of COPD and lung cancer (receives chemo).Nurse note: Pt reports chest pain and sob intermittent over last few days. But consistent since last night. Sob worse with exertion. Had ?pulmonary artery stent placed 2 weeks ago. Sts "I'm not sure if the stent is even working, it feels the same as it did before the stent. 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 Denies n/v, dizziness.  EXAM: CT ANGIOGRAPHY CHEST WITH CONTRAST  TECHNIQUE: Multidetector CT imaging of the chest was performed using the standard protocol during bolus administration of intravenous contrast. Multiplanar CT image reconstructions and MIPs were obtained to evaluate the vascular anatomy.  CONTRAST:  100 cc Omnipaque 350  COMPARISON:  Chest CT dated  09/02/2014.  FINDINGS: The previously demonstrated right hilar mass is larger. The posterior extension previously measured 4.7 x 2.6 cm and currently measures 6.4 x 3.3 cm with increased vascular and bronchial encasement. The previously demonstrated 5.0 x 2.8 cm precarinal mediastinal component currently measures 5.5 x 2.3 cm. By my measurements, this previously measure 5.0 x 2.1 cm and corresponding dimensions.  A previously demonstrated 6 mm short axis right hilar lymph node is increased in size, currently A 10.5 mm short axis on image number 43. No enlarged lymph nodes elsewhere in the chest or upper abdomen.  There is a minimal pericardial effusion with a maximum thickness of 6 mm. There are multiple small, sub cm nodules in both lungs. Some of these are new and some are enlarging. An old, healed right anterolateral rib fracture is again demonstrated. No bone metastases are visualized.  The pulmonary arteries are normally opacified with no pulmonary arterial filling defects seen. Moderate thoracic scoliosis is noted.  Review of the MIP images confirms the above findings.  IMPRESSION: 1.  No pulmonary emboli.  2. Enlarging right hilar and mediastinal lung cancer with increased vascular and bronchial encasement.  3. Multiple sub cm new and enlarging nodules in both lungs. These are suspicious for early metastases.  4.  Progressive right hilar metastatic adenopathy.   Electronically Signed   By: Enrique Sack M.D.   On: 11/03/2014 14:10   Dg Chest Port 1 View  11/30/2014   CLINICAL DATA:  Lung cancer with bilateral upper extremity edema  EXAM: PORTABLE CHEST - 1 VIEW  COMPARISON:  Chest radiograph October 09, 2014 and chest CT November 03, 2014  FINDINGS: The previously noted lung carcinoma in the right upper lobe is again present measuring approximately 4.2 x 3.4 cm. Is tumor appears to extend into the right hilum, unchanged. Lungs elsewhere clear. Heart size and pulmonary vascularity are normal. No adenopathy.  Port-A-Cath tip is in the superior vena cava. No pneumothorax. There is a stent in the superior vena cava region, stable.  IMPRESSION: Persistent right upper lobe mass extending in the right hilum. Stent in superior vena cava. No new opacity. No change in cardiac silhouette.   Electronically Signed   By: Lowella Grip III M.D.   On: 11/30/2014 15:49     CBC  Recent Labs Lab 11/28/14 1320 11/30/14 1450 12/01/14 0428  WBC 5.4  4.0 4.4  HGB 11.1* 10.3* 10.2*  HCT 34.6* 32.7* 32.0*  PLT 207 226 209  MCV 83.6 83.8 84.2  MCH 26.8 26.4 26.8  MCHC 32.1 31.5 31.9  RDW 16.4* 16.7* 16.7*  LYMPHSABS 1.1  --   --   MONOABS 0.9  --   --   EOSABS 0.1  --   --   BASOSABS 0.1  --   --     Chemistries   Recent Labs Lab 11/28/14 1320 11/30/14 1450  NA 140 137  K 3.8 4.0  CL  --  102  CO2 22 26  GLUCOSE 219* 178*  BUN 5.6* 7  CREATININE 0.8 0.59  CALCIUM 8.3* 8.1*  AST 20  --   ALT 18  --   ALKPHOS 80  --   BILITOT 0.23  --    ------------------------------------------------------------------------------------------------------------------ estimated creatinine clearance is 82.2 mL/min (by C-G formula based on Cr of 0.59). ------------------------------------------------------------------------------------------------------------------ No results for input(s): HGBA1C in the last 72 hours. ------------------------------------------------------------------------------------------------------------------ No results for input(s): CHOL, HDL, LDLCALC, TRIG, CHOLHDL, LDLDIRECT in the last 72 hours. ------------------------------------------------------------------------------------------------------------------ No results for input(s): TSH, T4TOTAL, T3FREE, THYROIDAB in the last 72 hours.  Invalid input(s): FREET3 ------------------------------------------------------------------------------------------------------------------ No results for input(s): VITAMINB12, FOLATE, FERRITIN, TIBC,  IRON, RETICCTPCT in the last 72 hours.  Coagulation profile  Recent Labs Lab 11/30/14 1444  INR 1.14    No results for input(s): DDIMER in the last 72 hours.  Cardiac Enzymes No results for input(s): CKMB, TROPONINI, MYOGLOBIN in the last 168 hours.  Invalid input(s): CK ------------------------------------------------------------------------------------------------------------------ Invalid input(s): POCBNP     Time Spent in minutes  35   Lala Lund K M.D on 12/01/2014 at 9:36 AM  Between 7am to 7pm - Pager - [phone]  After 7pm go to [email or web address] - [facility] Office  [phone]

## 2014-12-01 NOTE — Progress Notes (Signed)
Called to pt room to assess urine. Upon assessment it appeared pt has some blood in her urine. No change in vital signs. Dr. Candiss Norse called and new orders received to change next dose of Lovenox to 0900 tomorrow morning. Pharmacy called. Will continue to monitor.

## 2014-12-01 NOTE — Sedation Documentation (Signed)
O2 increased to 6L/[place], Dr Vernard Gambles aware.  Angiojet in use

## 2014-12-01 NOTE — Progress Notes (Signed)
ANTICOAGULATION CONSULT NOTE - Follow Up Consult  Pharmacy Consult for Heparin Indication: DVT  Allergies  Allergen Reactions  . Sulfonamide Derivatives Rash  . Codeine Other (See Comments)    hallucinations  . Dilaudid [Hydromorphone Hcl] Nausea And Vomiting and Other (See Comments)    Room started spinning.  Pt has been okay with low doses and nausea meds administered first  . Penicillins Other (See Comments)    Childhood allergy; reaction unknown  . Vicodin [Hydrocodone-Acetaminophen] Rash    Patient Measurements: Height: 5\' 7"  (170.2 cm) Weight: 179 lb 14.3 oz (81.6 kg) IBW/kg (Calculated) : 61.6 Heparin Dosing Weight: 77kg  Vital Signs: Temp: 98.9 F (37.2 C) (02/13 2313) Temp Source: Oral (02/13 2313) BP: 140/83 mmHg (02/13 2313) Pulse Rate: 117 (02/13 2313)  Labs:  Recent Labs  11/28/14 1320 11/28/14 1320 11/30/14 1444 11/30/14 1450 11/30/14 2311  HGB 11.1*  --   --  10.3*  --   HCT 34.6*  --   --  32.7*  --   PLT 207  --   --  226  --   APTT  --   --  30  --   --   LABPROT  --   --  14.7  --   --   INR  --   --  1.14  --   --   HEPARINUNFRC  --   --   --   --  <0.10*  CREATININE  --  0.8  --  0.59  --     Estimated Creatinine Clearance: 82.2 mL/min (by C-G formula based on Cr of 0.59).   Medications:  Heparin @ 950 units/hr  Assessment: 58yof started on heparin earlier today for her SVC stent thrombosis. Initial heparin level is undetectable. No issues with infusion per RN.  Goal of Therapy:  Heparin level 0.3-0.7 units/ml Monitor platelets by anticoagulation protocol: Yes   Plan:  1) Re-bolus heparin 2500 units x 1 2) Increase heparin to 1250 units/hr 3) Check 6 hour heparin level  Deboraha Sprang 12/01/2014,1:23 AM

## 2014-12-01 NOTE — Progress Notes (Signed)
Utilization Review Completed.Janice Ball T2/14/2016  

## 2014-12-01 NOTE — Sedation Documentation (Signed)
O2 increased to 4L/[place] SAt 93%

## 2014-12-01 NOTE — Progress Notes (Signed)
When patient was being assessed, dressing to left medial arm that was applied post IR thrombectomy procedure was saturated in serosanguineous drainage. IR paged, Dr. Jarvis Newcomer returned the call. New orders were given. Will continue to monitor patient.

## 2014-12-02 ENCOUNTER — Encounter (HOSPITAL_COMMUNITY): Payer: Self-pay | Admitting: *Deleted

## 2014-12-02 LAB — BASIC METABOLIC PANEL
Anion gap: 8 (ref 5–15)
BUN: <5 mg/dL — ABNORMAL LOW (ref 6–23)
CO2: 30 mmol/L (ref 19–32)
CREATININE: 0.59 mg/dL (ref 0.50–1.10)
Calcium: 7.9 mg/dL — ABNORMAL LOW (ref 8.4–10.5)
Chloride: 97 mmol/L (ref 96–112)
GFR calc Af Amer: >90 mL/min (ref >90)
Glucose, Bld: 173 mg/dL — ABNORMAL HIGH (ref 70–99)
Potassium: 3.7 mmol/L (ref 3.5–5.1)
Sodium: 135 mmol/L (ref 135–145)

## 2014-12-02 LAB — GLUCOSE, CAPILLARY
GLUCOSE-CAPILLARY: 192 mg/dL — AB (ref 70–99)
Glucose-Capillary: 152 mg/dL — ABNORMAL HIGH (ref 70–99)
Glucose-Capillary: 237 mg/dL — ABNORMAL HIGH (ref 70–99)
Glucose-Capillary: 137 mg/dL — ABNORMAL HIGH (ref 70–99)

## 2014-12-02 LAB — CBC
HEMATOCRIT: 29.5 % — AB (ref 36.0–46.0)
HEMOGLOBIN: 9.5 g/dL — AB (ref 12.0–15.0)
MCH: 26.3 pg (ref 26.0–34.0)
MCHC: 32.2 g/dL (ref 30.0–36.0)
MCV: 81.7 fL (ref 78.0–100.0)
PLATELETS: 181 10*3/uL (ref 150–400)
RBC: 3.61 MIL/uL — ABNORMAL LOW (ref 3.87–5.11)
RDW: 16.5 % — ABNORMAL HIGH (ref 11.5–15.5)
WBC: 5.2 10*3/uL (ref 4.0–10.5)

## 2014-12-02 LAB — HEPARIN LEVEL (UNFRACTIONATED): Heparin Unfractionated: 0.12 IU/mL — ABNORMAL LOW (ref 0.30–0.70)

## 2014-12-02 MED ORDER — HEPARIN (PORCINE) IN NACL 100-0.45 UNIT/ML-% IJ SOLN
1500.0000 [IU]/h | INTRAMUSCULAR | Status: DC
  Administered 2014-12-02: 1450 [IU]/h via INTRAVENOUS
  Administered 2014-12-02: 1250 [IU]/h via INTRAVENOUS
  Filled 2014-12-02 (×4): qty 250

## 2014-12-02 MED ORDER — FUROSEMIDE 10 MG/ML IJ SOLN
20.0000 mg | Freq: Once | INTRAMUSCULAR | Status: AC
  Administered 2014-12-02: 20 mg via INTRAVENOUS
  Filled 2014-12-02: qty 2

## 2014-12-02 MED ORDER — HEPARIN BOLUS VIA INFUSION
2000.0000 [IU] | Freq: Once | INTRAVENOUS | Status: AC
  Administered 2014-12-02: 2000 [IU] via INTRAVENOUS
  Filled 2014-12-02: qty 2000

## 2014-12-02 NOTE — Evaluation (Signed)
Physical Therapy Evaluation/Discharge Patient Details Name: Janice Ball MRN: [id number] DOB: 01/18/1956 Today's Date: 12/02/2014   History of Present Illness  Janice Ball is a 59 y.o. female with history of small cell lung cancer diagnosed 5 years ago currently undergoing chemotherapy, hypothyroidism, COPD, diabetes mellitus, SVC stent who presents with "swelling of her neck" and shortness of breath. She noticed her left arm was swollen a few days ago and underwent a venous duplex which revealed a DVT in the right subclavian, left internal jugular, left subclavian left axillary left basophilic cephalic radial and ulnar veins and left brachial She was started on Lovenox yesterday.  Clinical Impression  Patient evaluated by Physical Therapy with no further acute PT needs identified. All education has been completed and the patient has no further questions.  See below for any follow-up Physial Therapy or equipment needs. PT is signing off. Thank you for this referral.    Follow Up Recommendations No PT follow up    Equipment Recommendations  None recommended by PT    Recommendations for Other Services       Precautions / Restrictions Precautions Precautions: None Restrictions Weight Bearing Restrictions: No      Mobility  Bed Mobility Overal bed mobility: Independent                Transfers Overall transfer level: Independent Equipment used: None                Ambulation/Gait Ambulation/Gait assistance: Independent Ambulation Distance (Feet): 200 Feet Assistive device: None Gait Pattern/deviations: WFL(Within Functional Limits)   Gait velocity interpretation: at or above normal speed for age/gender General Gait Details: occasional devaition from path without LOB and pt able to correct without difficulty  Stairs            Wheelchair Mobility    Modified Rankin (Stroke Patients Only)       Balance Overall balance assessment: Independent                                            Pertinent Vitals/Pain Pain Assessment: No/denies pain    Home Living Family/patient expects to be discharged to:: Private residence Living Arrangements: Children Available Help at Discharge: Available 24 hours/day;Family Type of Home: House Home Access: Level entry     Home Layout: One level Home Equipment: None      Prior Function Level of Independence: Independent               Hand Dominance        Extremity/Trunk Assessment   Upper Extremity Assessment: Overall WFL for tasks assessed           Lower Extremity Assessment: Overall WFL for tasks assessed         Communication   Communication: No difficulties  Cognition Arousal/Alertness: Awake/alert Behavior During Therapy: WFL for tasks assessed/performed                        General Comments General comments (skin integrity, edema, etc.): O2 on RA before ambulation: 92%; during ambulation 92%; after ambulation 95% all on RA    Exercises        Assessment/Plan    PT Assessment Patent does not need any further PT services  PT Diagnosis     PT Problem List    PT Treatment Interventions  PT Goals (Current goals can be found in the Care Plan section) Acute Rehab PT Goals Patient Stated Goal: to go home    Frequency     Barriers to discharge        Co-evaluation               End of Session Equipment Utilized During Treatment: Gait belt Activity Tolerance: Patient tolerated treatment well Patient left: in chair;with call bell/phone within reach Nurse Communication: Mobility status;Other (comment) (O2 with ambulation)         Time: 1241-1301 PT Time Calculation (min) (ACUTE ONLY): 20 min   Charges:   PT Evaluation $Initial PT Evaluation Tier I: 1 Procedure PT Treatments $Gait Training: 8-22 mins   PT G Codes:       Laureen Abrahams, PT, DPT 12/02/2014 1:08 PM [phone]

## 2014-12-02 NOTE — Progress Notes (Signed)
Report called to RN on 3E. No s/s of acute distress noted, VS stable. Pt transferred via wheelchair accompanied by RN

## 2014-12-02 NOTE — Progress Notes (Signed)
Referring Physician(s): TRH  Subjective: Patient denies any hematuria today or any other signs of active bleeding.  She c/o ongoing shortness of breath, this may be worse from yesterday, but unsure because she has been bedrest. She denies any chest pain and states her left arm swelling has slightly improved from yesterday, she does have some soreness at access site.  Allergies: Sulfonamide derivatives; Codeine; Dilaudid; Penicillins; and Vicodin  Medications: Prior to Admission medications   Medication Sig Start Date End Date Taking? Authorizing Provider  acetaminophen (TYLENOL) 325 MG tablet Take 650 mg by mouth every 6 (six) hours as needed for mild pain or headache.    Yes Historical Provider, MD  albuterol (PROVENTIL HFA;VENTOLIN HFA) 108 (90 BASE) MCG/ACT inhaler Inhale 1 puff into the lungs every 6 (six) hours as needed for wheezing or shortness of breath.   Yes Historical Provider, MD  alendronate (FOSAMAX) 10 MG tablet Take 10 mg by mouth daily.  03/20/14  Yes Historical Provider, MD  aspirin 325 MG tablet Take 325 mg by mouth daily with breakfast.    Yes Historical Provider, MD  budesonide-formoterol (SYMBICORT) 160-4.5 MCG/ACT inhaler Inhale 2 puffs into the lungs 2 (two) times daily.   Yes Historical Provider, MD  cholecalciferol (VITAMIN D) 1000 UNITS tablet Take 1,000 Units by mouth every morning.    Yes Historical Provider, MD  cyanocobalamin 1000 MCG tablet Take 1,000 mcg by mouth every morning.    Yes Historical Provider, MD  enoxaparin (LOVENOX) 120 MG/0.8ML injection Inject 120 mg into the skin daily.   Yes Historical Provider, MD  glimepiride (AMARYL) 2 MG tablet Take 2 mg by mouth daily with breakfast.  12/21/13  Yes Historical Provider, MD  guaiFENesin (MUCINEX) 600 MG 12 hr tablet Take 1,200 mg by mouth 2 (two) times daily.   Yes Historical Provider, MD  ipratropium (ATROVENT) 0.02 % nebulizer solution Take 2.5 mLs (0.5 mg total) by nebulization 4 (four) times daily.  03/05/14  Yes Brand Males, MD  levothyroxine (SYNTHROID, LEVOTHROID) 125 MCG tablet Take 125 mcg by mouth daily before breakfast.    Yes Historical Provider, MD  lidocaine-prilocaine (EMLA) cream Apply 1 application topically as needed. Apply to port 1 hr before chemo 11/06/14  Yes Curt Bears, MD  LORazepam (ATIVAN) 1 MG tablet Take 1 mg by mouth 3 (three) times daily as needed for anxiety.    Yes Historical Provider, MD  magnesium gluconate (MAGONATE) 500 MG tablet Take 500 mg by mouth every morning.    Yes Historical Provider, MD  metFORMIN (GLUCOPHAGE) 1000 MG tablet Take 1,000 mg by mouth 2 (two) times daily with a meal.   Yes Historical Provider, MD  Multiple Vitamin (MULTIVITAMIN WITH MINERALS) TABS tablet Take 1 tablet by mouth every morning.   Yes Historical Provider, MD  OVER THE COUNTER MEDICATION Take 1 capsule by mouth daily. MSM. For energy and joint pain   Yes Historical Provider, MD  oxyCODONE (OXY IR/ROXICODONE) 5 MG immediate release tablet Take 1 tablet (5 mg total) by mouth every 4 (four) hours as needed for moderate pain. 11/28/14  Yes Adrena E Johnson, PA-C  PROAIR HFA 108 (90 BASE) MCG/ACT inhaler INHALE 2 PUFFS INTO THE LUNGS EVERY 6 (SIX) HOURS AS NEEDED FOR WHEEZING OR SHORTNESS OF BREATH. 10/08/14  Yes Brand Males, MD  prochlorperazine (COMPAZINE) 10 MG tablet Take 1 tablet (10 mg total) by mouth every 6 (six) hours as needed for nausea or vomiting. 11/28/14  Yes Carlton Adam, PA-C  sertraline (  ZOLOFT) 100 MG tablet Take 150 mg by mouth at bedtime.    Yes Historical Provider, MD  spironolactone (ALDACTONE) 25 MG tablet Take 25 mg by mouth every morning.    Yes Historical Provider, MD  docusate sodium 100 MG CAPS Take 100 mg by mouth 2 (two) times daily. Patient not taking: Reported on 11/28/2014 11/05/14   Reyne Dumas, MD     Vital Signs: BP 112/63 mmHg  Pulse 125  Temp(Src) 97.7 F (36.5 C) (Oral)  Resp 18  Ht 5\' 7"  (1.702 m)  Wt 174 lb 9.6 oz (79.198  kg)  BMI 27.34 kg/m2  SpO2 97%[place] 2L  Physical Exam General: A&Ox3, NAD, lying in bed, [place] 2L Ext: LUE swelling, access site dressing C/D/I-removed today, no oozing or hematoma, brachial and radial pulses intact, sensation intact  Imaging: Ct Angio Chest Pe W/cm &/or Wo Cm  11/30/2014   ADDENDUM REPORT: 11/30/2014 16:59  ADDENDUM: Critical Value/emergent results were called by telephone at the time of interpretation on 11/30/2014 at 4:56 pm to Dr. Noemi Chapel , who verbally acknowledged these results.   Electronically Signed   By: Genevie Ann M.D.   On: 11/30/2014 16:59   11/30/2014   CLINICAL DATA:  59 year old female with shortness of breath. Positive left upper extremity DVT. History of lung cancer and COPD.   Initial encounter.  EXAM: CT ANGIOGRAPHY CHEST WITH CONTRAST  TECHNIQUE: Multidetector CT imaging of the chest was performed using the standard protocol during bolus administration of intravenous contrast. Multiplanar CT image reconstructions and MIPs were obtained to evaluate the vascular anatomy.  CONTRAST:  176mL OMNIPAQUE IOHEXOL 350 MG/ML SOLN  COMPARISON:  Chest CTA 10/09/2014.  Common chest CTA 11/03/2014.  FINDINGS: Good contrast bolus timing in the pulmonary arterial tree. No pulmonary artery filling defect consistent with pulmonary embolus.  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 The right chest porta cath was injected common the catheter passed through a vascular stent of the SVC, which does also thrombosis (series 6, image 104). Some of the right upper lobe pulmonary arteries are significantly narrowed by tumor (image when 18).  At the same time mediastinal tumor bulk has slightly regressed since 10/09/2014 (such as on series 4, image 35 measuring 48 x 43 mm canal versus 57 x 59 mm at a comparable level previously).  No pericardial or pleural effusion. Narrowing of the carina and central right  lung airways has not significantly changed. Spiculated peribronchovascular opacity in the right upper lobe is stable to slightly regressed. No areas of worsening ventilation.  Stable visualized aorta and great vessels with atherosclerosis. Stable visualized upper abdominal viscera. No acute osseous abnormality identified.  Review of the MIP images confirms the above findings.  IMPRESSION: 1. 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 2. Tumor bulky is stable to slightly regressed since December. 3. No new chest abnormality identified.  Electronically Signed: By: Genevie Ann M.D. On: 11/30/2014 16:53   Ir Veno/ext/uni Left  12/01/2014   INDICATION: Right SVC clot and left arm brachial vein clot. 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 12 mm SVC stent placed 10/17/2014.  EXAM: THROMBOECTOMY MECHANICAL VENOUS; PTA VENOUS; LEFT EXTREMITY VENOGRAPHY; IR ULTRASOUND GUIDANCE VASC ACCESS RIGHT; IR ULTRASOUND GUIDANCE VASC ACCESS  LEFT; SUPERIOR VENA CAVOGRAM  COMPARISON:  CT 11/30/2014 and earlier studies  MEDICATIONS: Intravenous Fentanyl and Versed were administered as conscious sedation during continuous cardiorespiratory monitoring by the radiology RN, with a total moderate sedation time of 60 minutes.  CONTRAST:  104mL OMNIPAQUE IOHEXOL 300 MG/ML  SOLN  FLUOROSCOPY TIME:  5 minutes.  42 seconds.  COMPLICATIONS: None immediate  TECHNIQUE: Informed written consent was obtained from the patient after a discussion of the risks, benefits and alternatives to treatment. Questions regarding the procedure were encouraged and answered. A timeout was performed prior to the initiation of the procedure.  Survey ultrasound of the left upper extremity was performed. The brachial DVT was localized and a more peripheral  patent segment of the brachial vein was identified for access. Overlying skin site was marked.  The site was prepped and draped in the usual sterile fashion, and a sterile drape was applied covering the operative field. Maximum barrier sterile technique with sterile gowns and gloves were used for the procedure. A timeout was performed prior to the initiation of the procedure. Local anesthesia was provided with 1% lidocaine.  Under real-time ultrasound guidance, access to the left brachial vein was achieved with a micropuncture set. Left upper extremity venography was performed. The dilator was exchanged over a Bentson for a 6 [place] vascular sheath, through which a 5 [place] Kumpe catheter was advanced to the SVC. This was exchanged for a Angiojet catheter, which allowed pulse spray delivery of 10 mg tPA in100 mL sterile saline through the left upper extremity, subclavian and innominate vein clot. The Angiojet catheter would not advance across the stent into the SVC, so right IJ access was achieved. As before, the region was prepped and draped in usual sterile fashion and ultrasound-guided micropuncture access to the right IJ was achieved. Venography was performed. After the TPA had been allowed least 20 minutes post pulse spray, the Angiojet was again utilized in thrombectomy mode to extract the thrombus from the left upper extremity, subclavian and innominate veins. Residual occlusive thrombus in the central left subclavian and innominate vein was treated with additional passes of the Angiojet thrombectomy catheter. This was exchanged for a 10 mm Mustang balloon, advanced to the confluence of the left innominate vein and SVC for dilatation of a persistent stenosis. In similar fashion, the 10 mm balloon was advanced via the right IJ access for PTA of a proximal SVC stenosis. Final venography at both sites was performed. The catheters, guidewires and sheaths were removed and hemostasis achieved at the site with  manual compression. The patient tolerated the procedure well.  FINDINGS: Left upper extremity venography demonstrated occlusive clot in the central aspect of the left brachial vein extending through the axillary vein, left subclavian vein and left innominate vein. There is tapered narrowing of the innominate vein near its confluence with the SVC. There was non opacification of the SVC. The left upper extremity, subclavian, and innominate thrombus was treated with mechanical thrombectomy after pulse spray of tPA as detailed above. No significant residual clot on final venography.  Right IJ venography demonstrated patency of the SVC. The stent extends from the distal right innominate vein to the SVC, covering the inflow of the left innominate vein. No collateral filling was identified. There is mild narrowing of the stent at the confluence of the innominate veins.  The central left innominate vein stenosis responded well to 10 mm balloon angioplasty. Follow-up venography showed significant improvement in diameter of the vein at its confluence with the  SVC at the level of the sidewall stent. The proximal SVC stenosis responded readily to 10 mm balloon angioplasty and follow-up venography showed no extravasation, residual or recurrent stenosis.  IMPRESSION: 1. Extensive occlusive left upper extremity, subclavian, and innominate vein DVT, with successful TPA assisted mechanical thrombectomy. 2. The SVC stent remains patent. Non opacification on prior CT probably secondary to injection through the port catheter. 3. The SVC stent covers the inflow from the left innominate vein. Additionally, tapered narrowing of the central left innominate vein just proximal to its confluence with the SVC suggests involvement by the patient's known recurrent tumor. Although this region responded to 10 mm balloon angioplasty, she remains at risk for recurrence of thrombosis and should probably REMAIN ON LIFELONG ANTICOAGULATION if  tolerated. 4. Mild proximal SVC stent stenosis, with good response to 10 mm balloon angioplasty.   Electronically Signed   By: Lucrezia Europe M.D.   On: 12/01/2014 12:54   Ir Venocavagram Svc  12/01/2014   INDICATION: Right SVC clot and left arm brachial vein clot. 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 12 mm SVC stent placed 10/17/2014.  EXAM: THROMBOECTOMY MECHANICAL VENOUS; PTA VENOUS; LEFT EXTREMITY VENOGRAPHY; IR ULTRASOUND GUIDANCE VASC ACCESS RIGHT; IR ULTRASOUND GUIDANCE VASC ACCESS LEFT; SUPERIOR VENA CAVOGRAM  COMPARISON:  CT 11/30/2014 and earlier studies  MEDICATIONS: Intravenous Fentanyl and Versed were administered as conscious sedation during continuous cardiorespiratory monitoring by the radiology RN, with a total moderate sedation time of 60 minutes.  CONTRAST:  66mL OMNIPAQUE IOHEXOL 300 MG/ML  SOLN  FLUOROSCOPY TIME:  5 minutes.  42 seconds.  COMPLICATIONS: None immediate  TECHNIQUE: Informed written consent was obtained from the patient after a discussion of the risks, benefits and alternatives to treatment. Questions regarding the procedure were encouraged and answered. A timeout was performed prior to the initiation of the procedure.  Survey ultrasound of the left upper extremity was performed. The brachial DVT was localized and a more peripheral patent segment of the brachial vein was identified for access. Overlying skin site was marked.  The site was prepped and draped in the usual sterile fashion, and a sterile drape was applied covering the operative field. Maximum barrier sterile technique with sterile gowns and gloves were used for the procedure. A timeout was performed prior to the initiation of the procedure. Local anesthesia was provided with 1% lidocaine.  Under real-time ultrasound guidance, access to the left brachial vein was achieved with a micropuncture set. Left upper extremity venography was performed. The dilator was  exchanged over a Bentson for a 6 [place] vascular sheath, through which a 5 [place] Kumpe catheter was advanced to the SVC. This was exchanged for a Angiojet catheter, which allowed pulse spray delivery of 10 mg tPA in100 mL sterile saline through the left upper extremity, subclavian and innominate vein clot. The Angiojet catheter would not advance across the stent into the SVC, so right IJ access was achieved. As before, the region was prepped and draped in usual sterile fashion and ultrasound-guided micropuncture access to the right IJ was achieved. Venography was performed. After the TPA had been allowed least 20 minutes post pulse spray, the Angiojet was again utilized in thrombectomy mode to extract the thrombus from the left upper extremity, subclavian and innominate veins. Residual occlusive thrombus in the central left subclavian and innominate vein was treated with additional passes of the Angiojet thrombectomy catheter. This was exchanged for a 10 mm Mustang balloon, advanced  to the confluence of the left innominate vein and SVC for dilatation of a persistent stenosis. In similar fashion, the 10 mm balloon was advanced via the right IJ access for PTA of a proximal SVC stenosis. Final venography at both sites was performed. The catheters, guidewires and sheaths were removed and hemostasis achieved at the site with manual compression. The patient tolerated the procedure well.  FINDINGS: Left upper extremity venography demonstrated occlusive clot in the central aspect of the left brachial vein extending through the axillary vein, left subclavian vein and left innominate vein. There is tapered narrowing of the innominate vein near its confluence with the SVC. There was non opacification of the SVC. The left upper extremity, subclavian, and innominate thrombus was treated with mechanical thrombectomy after pulse spray of tPA as detailed above. No significant residual clot on final venography.  Right IJ venography  demonstrated patency of the SVC. The stent extends from the distal right innominate vein to the SVC, covering the inflow of the left innominate vein. No collateral filling was identified. There is mild narrowing of the stent at the confluence of the innominate veins.  The central left innominate vein stenosis responded well to 10 mm balloon angioplasty. Follow-up venography showed significant improvement in diameter of the vein at its confluence with the SVC at the level of the sidewall stent. The proximal SVC stenosis responded readily to 10 mm balloon angioplasty and follow-up venography showed no extravasation, residual or recurrent stenosis.  IMPRESSION: 1. Extensive occlusive left upper extremity, subclavian, and innominate vein DVT, with successful TPA assisted mechanical thrombectomy. 2. The SVC stent remains patent. Non opacification on prior CT probably secondary to injection through the port catheter. 3. The SVC stent covers the inflow from the left innominate vein. Additionally, tapered narrowing of the central left innominate vein just proximal to its confluence with the SVC suggests involvement by the patient's known recurrent tumor. Although this region responded to 10 mm balloon angioplasty, she remains at risk for recurrence of thrombosis and should probably REMAIN ON LIFELONG ANTICOAGULATION if tolerated. 4. Mild proximal SVC stent stenosis, with good response to 10 mm balloon angioplasty.   Electronically Signed   By: Lucrezia Europe M.D.   On: 12/01/2014 12:54   Ir Pta Venous Left  12/01/2014   INDICATION: Right SVC clot and left arm brachial vein clot. 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 12 mm SVC stent placed 10/17/2014.  EXAM: THROMBOECTOMY MECHANICAL VENOUS; PTA VENOUS; LEFT EXTREMITY VENOGRAPHY; IR ULTRASOUND GUIDANCE VASC ACCESS RIGHT; IR ULTRASOUND GUIDANCE VASC ACCESS LEFT; SUPERIOR VENA CAVOGRAM  COMPARISON:  CT 11/30/2014 and earlier  studies  MEDICATIONS: Intravenous Fentanyl and Versed were administered as conscious sedation during continuous cardiorespiratory monitoring by the radiology RN, with a total moderate sedation time of 60 minutes.  CONTRAST:  27mL OMNIPAQUE IOHEXOL 300 MG/ML  SOLN  FLUOROSCOPY TIME:  5 minutes.  42 seconds.  COMPLICATIONS: None immediate  TECHNIQUE: Informed written consent was obtained from the patient after a discussion of the risks, benefits and alternatives to treatment. Questions regarding the procedure were encouraged and answered. A timeout was performed prior to the initiation of the procedure.  Survey ultrasound of the left upper extremity was performed. The brachial DVT was localized and a more peripheral patent segment of the brachial vein was identified for access. Overlying skin site was marked.  The site was prepped and draped in the usual sterile fashion, and a sterile drape  was applied covering the operative field. Maximum barrier sterile technique with sterile gowns and gloves were used for the procedure. A timeout was performed prior to the initiation of the procedure. Local anesthesia was provided with 1% lidocaine.  Under real-time ultrasound guidance, access to the left brachial vein was achieved with a micropuncture set. Left upper extremity venography was performed. The dilator was exchanged over a Bentson for a 6 [place] vascular sheath, through which a 5 [place] Kumpe catheter was advanced to the SVC. This was exchanged for a Angiojet catheter, which allowed pulse spray delivery of 10 mg tPA in100 mL sterile saline through the left upper extremity, subclavian and innominate vein clot. The Angiojet catheter would not advance across the stent into the SVC, so right IJ access was achieved. As before, the region was prepped and draped in usual sterile fashion and ultrasound-guided micropuncture access to the right IJ was achieved. Venography was performed. After the TPA had been allowed least 20  minutes post pulse spray, the Angiojet was again utilized in thrombectomy mode to extract the thrombus from the left upper extremity, subclavian and innominate veins. Residual occlusive thrombus in the central left subclavian and innominate vein was treated with additional passes of the Angiojet thrombectomy catheter. This was exchanged for a 10 mm Mustang balloon, advanced to the confluence of the left innominate vein and SVC for dilatation of a persistent stenosis. In similar fashion, the 10 mm balloon was advanced via the right IJ access for PTA of a proximal SVC stenosis. Final venography at both sites was performed. The catheters, guidewires and sheaths were removed and hemostasis achieved at the site with manual compression. The patient tolerated the procedure well.  FINDINGS: Left upper extremity venography demonstrated occlusive clot in the central aspect of the left brachial vein extending through the axillary vein, left subclavian vein and left innominate vein. There is tapered narrowing of the innominate vein near its confluence with the SVC. There was non opacification of the SVC. The left upper extremity, subclavian, and innominate thrombus was treated with mechanical thrombectomy after pulse spray of tPA as detailed above. No significant residual clot on final venography.  Right IJ venography demonstrated patency of the SVC. The stent extends from the distal right innominate vein to the SVC, covering the inflow of the left innominate vein. No collateral filling was identified. There is mild narrowing of the stent at the confluence of the innominate veins.  The central left innominate vein stenosis responded well to 10 mm balloon angioplasty. Follow-up venography showed significant improvement in diameter of the vein at its confluence with the SVC at the level of the sidewall stent. The proximal SVC stenosis responded readily to 10 mm balloon angioplasty and follow-up venography showed no extravasation,  residual or recurrent stenosis.  IMPRESSION: 1. Extensive occlusive left upper extremity, subclavian, and innominate vein DVT, with successful TPA assisted mechanical thrombectomy. 2. The SVC stent remains patent. Non opacification on prior CT probably secondary to injection through the port catheter. 3. The SVC stent covers the inflow from the left innominate vein. Additionally, tapered narrowing of the central left innominate vein just proximal to its confluence with the SVC suggests involvement by the patient's known recurrent tumor. Although this region responded to 10 mm balloon angioplasty, she remains at risk for recurrence of thrombosis and should probably REMAIN ON LIFELONG ANTICOAGULATION if tolerated. 4. Mild proximal SVC stent stenosis, with good response to 10 mm balloon angioplasty.   Electronically Signed   By: Keturah Barre  Vernard Gambles M.D.   On: 12/01/2014 12:54   Ir Pta Venous Right  12/01/2014   INDICATION: Right SVC clot and left arm brachial vein clot. 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 12 mm SVC stent placed 10/17/2014.  EXAM: THROMBOECTOMY MECHANICAL VENOUS; PTA VENOUS; LEFT EXTREMITY VENOGRAPHY; IR ULTRASOUND GUIDANCE VASC ACCESS RIGHT; IR ULTRASOUND GUIDANCE VASC ACCESS LEFT; SUPERIOR VENA CAVOGRAM  COMPARISON:  CT 11/30/2014 and earlier studies  MEDICATIONS: Intravenous Fentanyl and Versed were administered as conscious sedation during continuous cardiorespiratory monitoring by the radiology RN, with a total moderate sedation time of 60 minutes.  CONTRAST:  101mL OMNIPAQUE IOHEXOL 300 MG/ML  SOLN  FLUOROSCOPY TIME:  5 minutes.  42 seconds.  COMPLICATIONS: None immediate  TECHNIQUE: Informed written consent was obtained from the patient after a discussion of the risks, benefits and alternatives to treatment. Questions regarding the procedure were encouraged and answered. A timeout was performed prior to the initiation of the procedure.  Survey  ultrasound of the left upper extremity was performed. The brachial DVT was localized and a more peripheral patent segment of the brachial vein was identified for access. Overlying skin site was marked.  The site was prepped and draped in the usual sterile fashion, and a sterile drape was applied covering the operative field. Maximum barrier sterile technique with sterile gowns and gloves were used for the procedure. A timeout was performed prior to the initiation of the procedure. Local anesthesia was provided with 1% lidocaine.  Under real-time ultrasound guidance, access to the left brachial vein was achieved with a micropuncture set. Left upper extremity venography was performed. The dilator was exchanged over a Bentson for a 6 [place] vascular sheath, through which a 5 [place] Kumpe catheter was advanced to the SVC. This was exchanged for a Angiojet catheter, which allowed pulse spray delivery of 10 mg tPA in100 mL sterile saline through the left upper extremity, subclavian and innominate vein clot. The Angiojet catheter would not advance across the stent into the SVC, so right IJ access was achieved. As before, the region was prepped and draped in usual sterile fashion and ultrasound-guided micropuncture access to the right IJ was achieved. Venography was performed. After the TPA had been allowed least 20 minutes post pulse spray, the Angiojet was again utilized in thrombectomy mode to extract the thrombus from the left upper extremity, subclavian and innominate veins. Residual occlusive thrombus in the central left subclavian and innominate vein was treated with additional passes of the Angiojet thrombectomy catheter. This was exchanged for a 10 mm Mustang balloon, advanced to the confluence of the left innominate vein and SVC for dilatation of a persistent stenosis. In similar fashion, the 10 mm balloon was advanced via the right IJ access for PTA of a proximal SVC stenosis. Final venography at both sites was  performed. The catheters, guidewires and sheaths were removed and hemostasis achieved at the site with manual compression. The patient tolerated the procedure well.  FINDINGS: Left upper extremity venography demonstrated occlusive clot in the central aspect of the left brachial vein extending through the axillary vein, left subclavian vein and left innominate vein. There is tapered narrowing of the innominate vein near its confluence with the SVC. There was non opacification of the SVC. The left upper extremity, subclavian, and innominate thrombus was treated with mechanical thrombectomy after pulse spray of tPA as detailed above. No significant residual clot on final venography.  Right IJ venography demonstrated patency of  the SVC. The stent extends from the distal right innominate vein to the SVC, covering the inflow of the left innominate vein. No collateral filling was identified. There is mild narrowing of the stent at the confluence of the innominate veins.  The central left innominate vein stenosis responded well to 10 mm balloon angioplasty. Follow-up venography showed significant improvement in diameter of the vein at its confluence with the SVC at the level of the sidewall stent. The proximal SVC stenosis responded readily to 10 mm balloon angioplasty and follow-up venography showed no extravasation, residual or recurrent stenosis.  IMPRESSION: 1. Extensive occlusive left upper extremity, subclavian, and innominate vein DVT, with successful TPA assisted mechanical thrombectomy. 2. The SVC stent remains patent. Non opacification on prior CT probably secondary to injection through the port catheter. 3. The SVC stent covers the inflow from the left innominate vein. Additionally, tapered narrowing of the central left innominate vein just proximal to its confluence with the SVC suggests involvement by the patient's known recurrent tumor. Although this region responded to 10 mm balloon angioplasty, she remains  at risk for recurrence of thrombosis and should probably REMAIN ON LIFELONG ANTICOAGULATION if tolerated. 4. Mild proximal SVC stent stenosis, with good response to 10 mm balloon angioplasty.   Electronically Signed   By: Lucrezia Europe M.D.   On: 12/01/2014 12:54   Ir Thrombect Veno Mech Mod Sed  12/01/2014   INDICATION: Right SVC clot and left arm brachial vein clot. 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 12 mm SVC stent placed 10/17/2014.  EXAM: THROMBOECTOMY MECHANICAL VENOUS; PTA VENOUS; LEFT EXTREMITY VENOGRAPHY; IR ULTRASOUND GUIDANCE VASC ACCESS RIGHT; IR ULTRASOUND GUIDANCE VASC ACCESS LEFT; SUPERIOR VENA CAVOGRAM  COMPARISON:  CT 11/30/2014 and earlier studies  MEDICATIONS: Intravenous Fentanyl and Versed were administered as conscious sedation during continuous cardiorespiratory monitoring by the radiology RN, with a total moderate sedation time of 60 minutes.  CONTRAST:  53mL OMNIPAQUE IOHEXOL 300 MG/ML  SOLN  FLUOROSCOPY TIME:  5 minutes.  42 seconds.  COMPLICATIONS: None immediate  TECHNIQUE: Informed written consent was obtained from the patient after a discussion of the risks, benefits and alternatives to treatment. Questions regarding the procedure were encouraged and answered. A timeout was performed prior to the initiation of the procedure.  Survey ultrasound of the left upper extremity was performed. The brachial DVT was localized and a more peripheral patent segment of the brachial vein was identified for access. Overlying skin site was marked.  The site was prepped and draped in the usual sterile fashion, and a sterile drape was applied covering the operative field. Maximum barrier sterile technique with sterile gowns and gloves were used for the procedure. A timeout was performed prior to the initiation of the procedure. Local anesthesia was provided with 1% lidocaine.  Under real-time ultrasound guidance, access to the left brachial vein was  achieved with a micropuncture set. Left upper extremity venography was performed. The dilator was exchanged over a Bentson for a 6 [place] vascular sheath, through which a 5 [place] Kumpe catheter was advanced to the SVC. This was exchanged for a Angiojet catheter, which allowed pulse spray delivery of 10 mg tPA in100 mL sterile saline through the left upper extremity, subclavian and innominate vein clot. The Angiojet catheter would not advance across the stent into the SVC, so right IJ access was achieved. As before, the region was prepped and draped in usual sterile fashion and ultrasound-guided micropuncture access  to the right IJ was achieved. Venography was performed. After the TPA had been allowed least 20 minutes post pulse spray, the Angiojet was again utilized in thrombectomy mode to extract the thrombus from the left upper extremity, subclavian and innominate veins. Residual occlusive thrombus in the central left subclavian and innominate vein was treated with additional passes of the Angiojet thrombectomy catheter. This was exchanged for a 10 mm Mustang balloon, advanced to the confluence of the left innominate vein and SVC for dilatation of a persistent stenosis. In similar fashion, the 10 mm balloon was advanced via the right IJ access for PTA of a proximal SVC stenosis. Final venography at both sites was performed. The catheters, guidewires and sheaths were removed and hemostasis achieved at the site with manual compression. The patient tolerated the procedure well.  FINDINGS: Left upper extremity venography demonstrated occlusive clot in the central aspect of the left brachial vein extending through the axillary vein, left subclavian vein and left innominate vein. There is tapered narrowing of the innominate vein near its confluence with the SVC. There was non opacification of the SVC. The left upper extremity, subclavian, and innominate thrombus was treated with mechanical thrombectomy after pulse  spray of tPA as detailed above. No significant residual clot on final venography.  Right IJ venography demonstrated patency of the SVC. The stent extends from the distal right innominate vein to the SVC, covering the inflow of the left innominate vein. No collateral filling was identified. There is mild narrowing of the stent at the confluence of the innominate veins.  The central left innominate vein stenosis responded well to 10 mm balloon angioplasty. Follow-up venography showed significant improvement in diameter of the vein at its confluence with the SVC at the level of the sidewall stent. The proximal SVC stenosis responded readily to 10 mm balloon angioplasty and follow-up venography showed no extravasation, residual or recurrent stenosis.  IMPRESSION: 1. Extensive occlusive left upper extremity, subclavian, and innominate vein DVT, with successful TPA assisted mechanical thrombectomy. 2. The SVC stent remains patent. Non opacification on prior CT probably secondary to injection through the port catheter. 3. The SVC stent covers the inflow from the left innominate vein. Additionally, tapered narrowing of the central left innominate vein just proximal to its confluence with the SVC suggests involvement by the patient's known recurrent tumor. Although this region responded to 10 mm balloon angioplasty, she remains at risk for recurrence of thrombosis and should probably REMAIN ON LIFELONG ANTICOAGULATION if tolerated. 4. Mild proximal SVC stent stenosis, with good response to 10 mm balloon angioplasty.   Electronically Signed   By: Lucrezia Europe M.D.   On: 12/01/2014 12:54   Ir US Guide Vasc Access Left  12/01/2014   INDICATION: Right SVC clot and left arm brachial vein clot. 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 12 mm SVC stent placed 10/17/2014.  EXAM: THROMBOECTOMY MECHANICAL VENOUS; PTA VENOUS; LEFT EXTREMITY VENOGRAPHY; IR ULTRASOUND GUIDANCE VASC ACCESS  RIGHT; IR ULTRASOUND GUIDANCE VASC ACCESS LEFT; SUPERIOR VENA CAVOGRAM  COMPARISON:  CT 11/30/2014 and earlier studies  MEDICATIONS: Intravenous Fentanyl and Versed were administered as conscious sedation during continuous cardiorespiratory monitoring by the radiology RN, with a total moderate sedation time of 60 minutes.  CONTRAST:  68mL OMNIPAQUE IOHEXOL 300 MG/ML  SOLN  FLUOROSCOPY TIME:  5 minutes.  42 seconds.  COMPLICATIONS: None immediate  TECHNIQUE: Informed written consent was obtained from the patient after a discussion of the  risks, benefits and alternatives to treatment. Questions regarding the procedure were encouraged and answered. A timeout was performed prior to the initiation of the procedure.  Survey ultrasound of the left upper extremity was performed. The brachial DVT was localized and a more peripheral patent segment of the brachial vein was identified for access. Overlying skin site was marked.  The site was prepped and draped in the usual sterile fashion, and a sterile drape was applied covering the operative field. Maximum barrier sterile technique with sterile gowns and gloves were used for the procedure. A timeout was performed prior to the initiation of the procedure. Local anesthesia was provided with 1% lidocaine.  Under real-time ultrasound guidance, access to the left brachial vein was achieved with a micropuncture set. Left upper extremity venography was performed. The dilator was exchanged over a Bentson for a 6 [place] vascular sheath, through which a 5 [place] Kumpe catheter was advanced to the SVC. This was exchanged for a Angiojet catheter, which allowed pulse spray delivery of 10 mg tPA in100 mL sterile saline through the left upper extremity, subclavian and innominate vein clot. The Angiojet catheter would not advance across the stent into the SVC, so right IJ access was achieved. As before, the region was prepped and draped in usual sterile fashion and ultrasound-guided  micropuncture access to the right IJ was achieved. Venography was performed. After the TPA had been allowed least 20 minutes post pulse spray, the Angiojet was again utilized in thrombectomy mode to extract the thrombus from the left upper extremity, subclavian and innominate veins. Residual occlusive thrombus in the central left subclavian and innominate vein was treated with additional passes of the Angiojet thrombectomy catheter. This was exchanged for a 10 mm Mustang balloon, advanced to the confluence of the left innominate vein and SVC for dilatation of a persistent stenosis. In similar fashion, the 10 mm balloon was advanced via the right IJ access for PTA of a proximal SVC stenosis. Final venography at both sites was performed. The catheters, guidewires and sheaths were removed and hemostasis achieved at the site with manual compression. The patient tolerated the procedure well.  FINDINGS: Left upper extremity venography demonstrated occlusive clot in the central aspect of the left brachial vein extending through the axillary vein, left subclavian vein and left innominate vein. There is tapered narrowing of the innominate vein near its confluence with the SVC. There was non opacification of the SVC. The left upper extremity, subclavian, and innominate thrombus was treated with mechanical thrombectomy after pulse spray of tPA as detailed above. No significant residual clot on final venography.  Right IJ venography demonstrated patency of the SVC. The stent extends from the distal right innominate vein to the SVC, covering the inflow of the left innominate vein. No collateral filling was identified. There is mild narrowing of the stent at the confluence of the innominate veins.  The central left innominate vein stenosis responded well to 10 mm balloon angioplasty. Follow-up venography showed significant improvement in diameter of the vein at its confluence with the SVC at the level of the sidewall stent. The  proximal SVC stenosis responded readily to 10 mm balloon angioplasty and follow-up venography showed no extravasation, residual or recurrent stenosis.  IMPRESSION: 1. Extensive occlusive left upper extremity, subclavian, and innominate vein DVT, with successful TPA assisted mechanical thrombectomy. 2. The SVC stent remains patent. Non opacification on prior CT probably secondary to injection through the port catheter. 3. The SVC stent covers the inflow from the left innominate  vein. Additionally, tapered narrowing of the central left innominate vein just proximal to its confluence with the SVC suggests involvement by the patient's known recurrent tumor. Although this region responded to 10 mm balloon angioplasty, she remains at risk for recurrence of thrombosis and should probably REMAIN ON LIFELONG ANTICOAGULATION if tolerated. 4. Mild proximal SVC stent stenosis, with good response to 10 mm balloon angioplasty.   Electronically Signed   By: Lucrezia Europe M.D.   On: 12/01/2014 12:54   Ir US Guide Vasc Access Right  12/01/2014   INDICATION: Right SVC clot and left arm brachial vein clot. 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 12 mm SVC stent placed 10/17/2014.  EXAM: THROMBOECTOMY MECHANICAL VENOUS; PTA VENOUS; LEFT EXTREMITY VENOGRAPHY; IR ULTRASOUND GUIDANCE VASC ACCESS RIGHT; IR ULTRASOUND GUIDANCE VASC ACCESS LEFT; SUPERIOR VENA CAVOGRAM  COMPARISON:  CT 11/30/2014 and earlier studies  MEDICATIONS: Intravenous Fentanyl and Versed were administered as conscious sedation during continuous cardiorespiratory monitoring by the radiology RN, with a total moderate sedation time of 60 minutes.  CONTRAST:  8mL OMNIPAQUE IOHEXOL 300 MG/ML  SOLN  FLUOROSCOPY TIME:  5 minutes.  42 seconds.  COMPLICATIONS: None immediate  TECHNIQUE: Informed written consent was obtained from the patient after a discussion of the risks, benefits and alternatives to treatment. Questions  regarding the procedure were encouraged and answered. A timeout was performed prior to the initiation of the procedure.  Survey ultrasound of the left upper extremity was performed. The brachial DVT was localized and a more peripheral patent segment of the brachial vein was identified for access. Overlying skin site was marked.  The site was prepped and draped in the usual sterile fashion, and a sterile drape was applied covering the operative field. Maximum barrier sterile technique with sterile gowns and gloves were used for the procedure. A timeout was performed prior to the initiation of the procedure. Local anesthesia was provided with 1% lidocaine.  Under real-time ultrasound guidance, access to the left brachial vein was achieved with a micropuncture set. Left upper extremity venography was performed. The dilator was exchanged over a Bentson for a 6 [place] vascular sheath, through which a 5 [place] Kumpe catheter was advanced to the SVC. This was exchanged for a Angiojet catheter, which allowed pulse spray delivery of 10 mg tPA in100 mL sterile saline through the left upper extremity, subclavian and innominate vein clot. The Angiojet catheter would not advance across the stent into the SVC, so right IJ access was achieved. As before, the region was prepped and draped in usual sterile fashion and ultrasound-guided micropuncture access to the right IJ was achieved. Venography was performed. After the TPA had been allowed least 20 minutes post pulse spray, the Angiojet was again utilized in thrombectomy mode to extract the thrombus from the left upper extremity, subclavian and innominate veins. Residual occlusive thrombus in the central left subclavian and innominate vein was treated with additional passes of the Angiojet thrombectomy catheter. This was exchanged for a 10 mm Mustang balloon, advanced to the confluence of the left innominate vein and SVC for dilatation of a persistent stenosis. In similar fashion,  the 10 mm balloon was advanced via the right IJ access for PTA of a proximal SVC stenosis. Final venography at both sites was performed. The catheters, guidewires and sheaths were removed and hemostasis achieved at the site with manual compression. The patient tolerated the procedure well.  FINDINGS: Left upper extremity venography demonstrated occlusive clot  in the central aspect of the left brachial vein extending through the axillary vein, left subclavian vein and left innominate vein. There is tapered narrowing of the innominate vein near its confluence with the SVC. There was non opacification of the SVC. The left upper extremity, subclavian, and innominate thrombus was treated with mechanical thrombectomy after pulse spray of tPA as detailed above. No significant residual clot on final venography.  Right IJ venography demonstrated patency of the SVC. The stent extends from the distal right innominate vein to the SVC, covering the inflow of the left innominate vein. No collateral filling was identified. There is mild narrowing of the stent at the confluence of the innominate veins.  The central left innominate vein stenosis responded well to 10 mm balloon angioplasty. Follow-up venography showed significant improvement in diameter of the vein at its confluence with the SVC at the level of the sidewall stent. The proximal SVC stenosis responded readily to 10 mm balloon angioplasty and follow-up venography showed no extravasation, residual or recurrent stenosis.  IMPRESSION: 1. Extensive occlusive left upper extremity, subclavian, and innominate vein DVT, with successful TPA assisted mechanical thrombectomy. 2. The SVC stent remains patent. Non opacification on prior CT probably secondary to injection through the port catheter. 3. The SVC stent covers the inflow from the left innominate vein. Additionally, tapered narrowing of the central left innominate vein just proximal to its confluence with the SVC suggests  involvement by the patient's known recurrent tumor. Although this region responded to 10 mm balloon angioplasty, she remains at risk for recurrence of thrombosis and should probably REMAIN ON LIFELONG ANTICOAGULATION if tolerated. 4. Mild proximal SVC stent stenosis, with good response to 10 mm balloon angioplasty.   Electronically Signed   By: Lucrezia Europe M.D.   On: 12/01/2014 12:54   Dg Chest Port 1 View  11/30/2014   CLINICAL DATA:  Lung cancer with bilateral upper extremity edema  EXAM: PORTABLE CHEST - 1 VIEW  COMPARISON:  Chest radiograph October 09, 2014 and chest CT November 03, 2014  FINDINGS: The previously noted lung carcinoma in the right upper lobe is again present measuring approximately 4.2 x 3.4 cm. Is tumor appears to extend into the right hilum, unchanged. Lungs elsewhere clear. Heart size and pulmonary vascularity are normal. No adenopathy. Port-A-Cath tip is in the superior vena cava. No pneumothorax. There is a stent in the superior vena cava region, stable.  IMPRESSION: Persistent right upper lobe mass extending in the right hilum. Stent in superior vena cava. No new opacity. No change in cardiac silhouette.   Electronically Signed   By: Lowella Grip III M.D.   On: 11/30/2014 15:49    Labs:  CBC:  Recent Labs  11/28/14 1320 11/30/14 1450 12/01/14 0428 12/02/14 0447  WBC 5.4 4.0 4.4 5.2  HGB 11.1* 10.3* 10.2* 9.5*  HCT 34.6* 32.7* 32.0* 29.5*  PLT 207 226 209 181    COAGS:  Recent Labs  10/17/14 1110 11/30/14 1444  INR 1.11 1.14  APTT 29 30    BMP:  Recent Labs  11/03/14 1222 11/04/14 0435  11/21/14 1245 11/28/14 1320 11/30/14 1450 12/02/14 0447  NA 135 135  < > 138 140 137 135  K 3.8 3.8  < > 3.9 3.8 4.0 3.7  CL 99 100  --   --   --  102 97  CO2 22 27  < > 24 22 26 30   GLUCOSE 188* 173*  < > 309* 219* 178* 173*  BUN  8 8  < > 8.1 5.6* 7 <5*  CALCIUM 8.3* 8.2*  < > 8.1* 8.3* 8.1* 7.9*  CREATININE 0.71 0.61  < > 0.8 0.8 0.59 0.59  GFRNONAA >90  >90  --   --   --  >90 >90  GFRAA >90 >90  --   --   --  >90 >90  < > = values in this interval not displayed.  LIVER FUNCTION TESTS:  Recent Labs  11/07/14 1205 11/14/14 1354 11/21/14 1245 11/28/14 1320  BILITOT 0.27 0.33 0.25 0.23  AST 24 23 23 20   ALT 13 21 25 18   ALKPHOS 77 75 73 80  PROT 7.3 6.8 6.7 6.9  ALBUMIN 3.4* 3.3* 3.3* 3.6    Assessment and Plan: SVC Syndrome  S/p LUE and SVC venogram with TPA assisted mechanical thrombectomy and PTA of left innominate vein and SVC stenosis, on IV heparin now, vitals stable compared to pre-procedure and H/H trend slightly down, however no active signs of bleeding.  History of SVC stent 10/17/14 History of recurrent non-small cell lung cancer RUL. Hematuria resolved, no active signs of bleeding, no chest pain, continued shortness of breath [place] 2L, LUE swelling slowly improving    Signed: Hedy Jacob 12/02/2014, 11:13 AM   I spent a total of 15 in face to face in clinical consultation/evaluation, greater than 50% of which was counseling/coordinating care for SVC syndrome.

## 2014-12-02 NOTE — Progress Notes (Signed)
ANTICOAGULATION CONSULT NOTE - Follow Up Consult  Pharmacy Consult for Heparin Indication: DVT  Allergies  Allergen Reactions  . Sulfonamide Derivatives Rash  . Codeine Other (See Comments)    hallucinations  . Dilaudid [Hydromorphone Hcl] Nausea And Vomiting and Other (See Comments)    Room started spinning.  Pt has been okay with low doses and nausea meds administered first  . Penicillins Other (See Comments)    Childhood allergy; reaction unknown  . Vicodin [Hydrocodone-Acetaminophen] Rash    Patient Measurements: Height: 5\' 7"  (170.2 cm) Weight: 174 lb 9.6 oz (79.198 kg) IBW/kg (Calculated) : 61.6 Heparin Dosing Weight: 77 kg  Vital Signs: Temp: 98.4 F (36.9 C) (02/15 1804) Temp Source: Oral (02/15 1804) BP: 119/59 mmHg (02/15 1804) Pulse Rate: 122 (02/15 1804)  Labs:  Recent Labs  11/30/14 1444  11/30/14 1450 11/30/14 2311 12/01/14 0428 12/02/14 0447 12/02/14 1733  HGB  --   < > 10.3*  --  10.2* 9.5*  --   HCT  --   --  32.7*  --  32.0* 29.5*  --   PLT  --   --  226  --  209 181  --   APTT 30  --   --   --   --   --   --   LABPROT 14.7  --   --   --   --   --   --   INR 1.14  --   --   --   --   --   --   HEPARINUNFRC  --   --   --  <0.10* 0.44  --  0.12*  CREATININE  --   --  0.59  --   --  0.59  --   < > = values in this interval not displayed.  Estimated Creatinine Clearance: 81 mL/min (by C-G formula based on Cr of 0.59).   Medications:  Prescriptions prior to admission  Medication Sig Dispense Refill Last Dose  . acetaminophen (TYLENOL) 325 MG tablet Take 650 mg by mouth every 6 (six) hours as needed for mild pain or headache.    11/29/2014 at Unknown time  . albuterol (PROVENTIL HFA;VENTOLIN HFA) 108 (90 BASE) MCG/ACT inhaler Inhale 1 puff into the lungs every 6 (six) hours as needed for wheezing or shortness of breath.   11/30/2014 at Unknown time  . alendronate (FOSAMAX) 10 MG tablet Take 10 mg by mouth daily.    11/30/2014 at Unknown time  .  aspirin 325 MG tablet Take 325 mg by mouth daily with breakfast.    11/29/2014 at Unknown time  . budesonide-formoterol (SYMBICORT) 160-4.5 MCG/ACT inhaler Inhale 2 puffs into the lungs 2 (two) times daily.   11/30/2014 at Unknown time  . cholecalciferol (VITAMIN D) 1000 UNITS tablet Take 1,000 Units by mouth every morning.    11/29/2014 at Unknown time  . cyanocobalamin 1000 MCG tablet Take 1,000 mcg by mouth every morning.    11/29/2014 at Unknown time  . enoxaparin (LOVENOX) 120 MG/0.8ML injection Inject 120 mg into the skin daily.   11/29/2014 at Unknown time  . glimepiride (AMARYL) 2 MG tablet Take 2 mg by mouth daily with breakfast.    11/29/2014 at Unknown time  . guaiFENesin (MUCINEX) 600 MG 12 hr tablet Take 1,200 mg by mouth 2 (two) times daily.   11/29/2014 at Unknown time  . ipratropium (ATROVENT) 0.02 % nebulizer solution Take 2.5 mLs (0.5 mg total) by nebulization 4 (four) times daily.  150 mL 4 11/30/2014 at Unknown time  . levothyroxine (SYNTHROID, LEVOTHROID) 125 MCG tablet Take 125 mcg by mouth daily before breakfast.    11/30/2014 at Unknown time  . lidocaine-prilocaine (EMLA) cream Apply 1 application topically as needed. Apply to port 1 hr before chemo 30 g 0 11/28/2014  . LORazepam (ATIVAN) 1 MG tablet Take 1 mg by mouth 3 (three) times daily as needed for anxiety.    11/29/2014 at Unknown time  . magnesium gluconate (MAGONATE) 500 MG tablet Take 500 mg by mouth every morning.    11/29/2014 at Unknown time  . metFORMIN (GLUCOPHAGE) 1000 MG tablet Take 1,000 mg by mouth 2 (two) times daily with a meal.   11/29/2014 at Unknown time  . Multiple Vitamin (MULTIVITAMIN WITH MINERALS) TABS tablet Take 1 tablet by mouth every morning.   11/29/2014 at Unknown time  . OVER THE COUNTER MEDICATION Take 1 capsule by mouth daily. MSM. For energy and joint pain   a week  . oxyCODONE (OXY IR/ROXICODONE) 5 MG immediate release tablet Take 1 tablet (5 mg total) by mouth every 4 (four) hours as needed for  moderate pain. 60 tablet 0 11/30/2014 at Unknown time  . PROAIR HFA 108 (90 BASE) MCG/ACT inhaler INHALE 2 PUFFS INTO THE LUNGS EVERY 6 (SIX) HOURS AS NEEDED FOR WHEEZING OR SHORTNESS OF BREATH. 8.5 each 2 11/30/2014 at Unknown time  . prochlorperazine (COMPAZINE) 10 MG tablet Take 1 tablet (10 mg total) by mouth every 6 (six) hours as needed for nausea or vomiting. 30 tablet 1 11/28/2014  . sertraline (ZOLOFT) 100 MG tablet Take 150 mg by mouth at bedtime.    11/29/2014 at Unknown time  . spironolactone (ALDACTONE) 25 MG tablet Take 25 mg by mouth every morning.    11/29/2014 at Unknown time  . docusate sodium 100 MG CAPS Take 100 mg by mouth 2 (two) times daily. 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 Initial HL was undetectable, now HL is therapeutic at 0.44. No issues per nursing. Hgb 10.2, plts wnl.   Pt had been consulted to transition from heparin to lovenox and then back to heparin and was started on heparin 1250 units/hr.  Initial HL is SUBtherapeutic at 0.12 on heparin 1250 units/hr, which had been a therapeutic regimen prior to the switch to lovenox.   Goal of Therapy:  Heparin level 0.3-0.7 units/ml Monitor platelets by anticoagulation protocol: Yes   Plan:  Bolus heparin 2000 units Increase heparin to 1450 units/hr Check 6 hr HL Monitor daily HL, CBC, s/s of bleed  Andrey Cota. Diona Foley, PharmD Clinical Pharmacist Pager [phone] 12/02/2014,6:40 PM

## 2014-12-02 NOTE — Progress Notes (Signed)
Patient Demographics  Janice Ball, is a 59 y.o. female, DOB - 12/02/1955, OYD:[id number]  Admit date - 11/30/2014   Admitting Physician Debbe Odea, MD  Outpatient Primary MD for the patient is [name], NP  LOS - 2   Chief Complaint  Patient presents with  . Arm Swelling  . Shortness of Breath  . chemo         Subjective:   Janice Ball today has, No headache, No chest pain, No abdominal pain - No Nausea, No new weakness tingling or numbness, No Cough - SOB.    Assessment & Plan    1. Right SVC Stent with suspected clot and new left arm brachial vein clot. 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 Status post venogram and TPA by IR, venogram revealed patent SVC tent on the right side and extensive left upper extremity clot burden requiring TPA, she developed some hematuria yesterday evening after her TPA and Lovenox.   We will give her heparin drip without bolus for 24 hours, if no further bleeding will switch her on Lovenox. If she remains stable discharge in the morning with follow-up with oncologist Dr. [name]. Discussed over the phone with Dr. Bridgett Larsson vascular surgeon on 12/01/2014.    2. Right upper lobe non-small cell lung cancer. Undergoing repeat chemotherapy under Dr. [name]. Outpatient follow-up post discharge.   3. Baseline sinus tachycardia. Heart rate between 110 and 120 at baseline reviewed Dr. Worthy Flank office note. Stable.   4. Hypothyroidism. Continue Synthroid. Request PCP to check TSH 1 time on neck visit.   5. Diabetes mellitus type 2. Currently on sliding scale. We will give her home dose Amaryl once taking by mouth.  CBG (last 3)   Recent Labs  12/01/14 1651 12/01/14 2139 12/02/14 0751  GLUCAP 169* 160*  152*       Code Status: Full  Family Communication: None  Disposition Plan: Home   Procedures CT angiogram chest, venous duplex left arm,  IR TPA-  IMPRESSION: 1. Extensive occlusive left upper extremity, subclavian, and innominate vein DVT, with successful TPA assisted mechanical thrombectomy. 2. The SVC stent remains patent. Non opacification on prior CT probably secondary to injection through the port catheter. 3. The SVC stent covers the inflow from the left innominate vein. Additionally, tapered narrowing of the central left innominate vein just proximal to its confluence with the SVC suggests involvement by the patient's known recurrent tumor. Although this region responded to 10 mm balloon angioplasty, she remains at risk for recurrence of thrombosis and should probably REMAIN ON LIFELONG ANTICOAGULATION if tolerated. 4. Mild proximal SVC stent stenosis, with good response to 10 mm balloon angioplasty.    Consults  IR, Vas Surg Dr Bridgett Larsson over the phone     Medications  Scheduled Meds: . antiseptic oral rinse  7 mL Mouth Rinse BID  . budesonide-formoterol  2 puff Inhalation BID  . glimepiride  2 mg Oral Q breakfast  . guaiFENesin  1,200 mg Oral BID  . insulin aspart  0-5 Units Subcutaneous QHS  . insulin aspart  0-9 Units Subcutaneous TID WC  . levothyroxine  125 mcg Oral QAC breakfast  . sertraline  150 mg Oral QHS  .  sodium chloride  3 mL Intravenous Q12H  . spironolactone  25 mg Oral Daily   Continuous Infusions: . heparin 1,250 Units/hr (12/02/14 0850)   PRN Meds:.sodium chloride, acetaminophen, alum & mag hydroxide-simeth, guaiFENesin-dextromethorphan, HYDROcodone-acetaminophen, levalbuterol, LORazepam, morphine injection, ondansetron **OR** ondansetron (ZOFRAN) IV, oxyCODONE, prochlorperazine, sodium chloride  DVT Prophylaxis    Heparin  gtt  Lab Results  Component Value Date   PLT 181 12/02/2014    Antibiotics     Anti-infectives    None           Objective:   Filed Vitals:   12/01/14 1953 12/01/14 2307 12/02/14 0431 12/02/14 0727  BP: 131/69 117/68 123/71 120/63  Pulse: 125 126 113 110  Temp: 98.8 F (37.1 C) 99.7 F (37.6 C) 98.7 F (37.1 C) 98.1 F (36.7 C)  TempSrc: Oral Oral Oral Oral  Resp: 20 21 16  38  Height:      Weight:      SpO2: 95% 95% 96% 95%    Wt Readings from Last 3 Encounters:  11/30/14 81.6 kg (179 lb 14.3 oz)  11/28/14 79.606 kg (175 lb 8 oz)  11/06/14 80.74 kg (178 lb)     Intake/Output Summary (Last 24 hours) at 12/02/14 0923 Last data filed at 12/02/14 0900  Gross per 24 hour  Intake    360 ml  Output   1150 ml  Net   -790 ml     Physical Exam  Awake Alert, Oriented X 3, No new F.N deficits, Normal affect [place].AT,PERRAL Supple Neck,No JVD, No cervical lymphadenopathy appriciated.  Symmetrical Chest wall movement, Good air movement bilaterally, CTAB RRR,No Gallops,Rubs or new Murmurs, No Parasternal Heave +ve B.Sounds, Abd Soft, No tenderness, No organomegaly appriciated, No rebound - guarding or rigidity. No Cyanosis, Clubbing or edema, No new Rash or bruise  , L arm swollen   Data Review   Micro Results Recent Results (from the past 240 hour(s))  MRSA PCR Screening     Status: None   Collection Time: 11/30/14  8:52 PM  Result Value Ref Range Status   MRSA by PCR NEGATIVE NEGATIVE Final    Comment:        The GeneXpert MRSA Assay (FDA approved for NASAL specimens only), is one component of a comprehensive MRSA colonization surveillance program. It is not intended to diagnose MRSA infection nor to guide or monitor treatment for MRSA infections.     Radiology Reports Ct Angio Chest Pe W/cm &/or Wo Cm  11/30/2014   ADDENDUM REPORT: 11/30/2014 16:59  ADDENDUM: Critical Value/emergent results were called by telephone at the time of interpretation on 11/30/2014 at 4:56 pm to Dr. Noemi Chapel , who verbally acknowledged these results.   Electronically Signed   By: Genevie Ann  M.D.   On: 11/30/2014 16:59   11/30/2014   CLINICAL DATA:  59 year old female with shortness of breath. Positive left upper extremity DVT. History of lung cancer and COPD.   Initial encounter.  EXAM: CT ANGIOGRAPHY CHEST WITH CONTRAST  TECHNIQUE: Multidetector CT imaging of the chest was performed using the standard protocol during bolus administration of intravenous contrast. Multiplanar CT image reconstructions and MIPs were obtained to evaluate the vascular anatomy.  CONTRAST:  153mL OMNIPAQUE IOHEXOL 350 MG/ML SOLN  COMPARISON:  Chest CTA 10/09/2014.  Common chest CTA 11/03/2014.  FINDINGS: Good contrast bolus timing in the pulmonary arterial tree. No pulmonary artery filling defect consistent with pulmonary embolus.  However, severe tumor infiltration of the right hilum, mediastinum, and right  peritracheal soft tissues has resulted in SVC thrombosis. The right chest porta cath was injected common the catheter passed through a vascular stent of the SVC, which does also thrombosis (series 6, image 104). Some of the right upper lobe pulmonary arteries are significantly narrowed by tumor (image when 18).  At the same time mediastinal tumor bulk has slightly regressed since 10/09/2014 (such as on series 4, image 35 measuring 48 x 43 mm canal versus 57 x 59 mm at a comparable level previously).  No pericardial or pleural effusion. Narrowing of the carina and central right lung airways has not significantly changed. Spiculated peribronchovascular opacity in the right upper lobe is stable to slightly regressed. No areas of worsening ventilation.  Stable visualized aorta and great vessels with atherosclerosis. Stable visualized upper abdominal viscera. No acute osseous abnormality identified.  Review of the MIP images confirms the above findings.  IMPRESSION: 1. 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 And also, there is continued narrowing of right  upper lobe pulmonary arteries by mediastinal tumor. 2. Tumor bulky is stable to slightly regressed since December. 3. No new chest abnormality identified.  Electronically Signed: By: Genevie Ann M.D. On: 11/30/2014 16:53   Ct Angio Chest Pe W/cm &/or Wo Cm  11/03/2014   CLINICAL DATA:  Increased chest pain and SOB s/p recent stent placement. 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 History of COPD and lung cancer (receives chemo).Nurse note: Pt reports chest pain and sob intermittent over last few days. But consistent since last night. Sob worse with exertion. Had ?pulmonary artery stent placed 2 weeks ago. Sts "I'm not sure if the stent is even working, it feels the same as it did before the stent. 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 Denies n/v, dizziness.  EXAM: CT ANGIOGRAPHY CHEST WITH CONTRAST  TECHNIQUE: Multidetector CT imaging of the chest was performed using the standard protocol during bolus administration of intravenous contrast. Multiplanar CT image reconstructions and MIPs were obtained to evaluate the vascular anatomy.  CONTRAST:  100 cc Omnipaque 350  COMPARISON:  Chest CT dated 09/02/2014.  FINDINGS: The previously demonstrated right hilar mass is larger. The posterior extension previously measured 4.7 x 2.6 cm and currently measures 6.4 x 3.3 cm with increased vascular and bronchial encasement. The previously demonstrated 5.0 x 2.8 cm precarinal mediastinal component currently measures 5.5 x 2.3 cm. By my measurements, this previously measure 5.0 x 2.1 cm and corresponding dimensions.  A previously demonstrated 6 mm short axis right hilar lymph node is increased in size, currently A 10.5 mm short axis on image number 43. No enlarged lymph nodes elsewhere in the chest or upper abdomen.  There is a minimal pericardial effusion with a maximum thickness of 6 mm. There are multiple small, sub cm nodules in both lungs. Some of these are new and some are enlarging. An old, healed right  anterolateral rib fracture is again demonstrated. No bone metastases are visualized.  The pulmonary arteries are normally opacified with no pulmonary arterial filling defects seen. Moderate thoracic scoliosis is noted.  Review of the MIP images confirms the above findings.  IMPRESSION: 1.  No pulmonary emboli.  2. Enlarging right hilar and mediastinal lung cancer with increased vascular and bronchial encasement.  3. Multiple sub cm new and enlarging nodules in both lungs. These are suspicious for early metastases.  4.  Progressive right hilar metastatic adenopathy.   Electronically Signed   By:  Enrique Sack M.D.   On: 11/03/2014 14:10   Ir Veno/ext/uni Left  12/01/2014   INDICATION: Right SVC clot and left arm brachial vein clot. 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 12 mm SVC stent placed 10/17/2014.  EXAM: THROMBOECTOMY MECHANICAL VENOUS; PTA VENOUS; LEFT EXTREMITY VENOGRAPHY; IR ULTRASOUND GUIDANCE VASC ACCESS RIGHT; IR ULTRASOUND GUIDANCE VASC ACCESS LEFT; SUPERIOR VENA CAVOGRAM  COMPARISON:  CT 11/30/2014 and earlier studies  MEDICATIONS: Intravenous Fentanyl and Versed were administered as conscious sedation during continuous cardiorespiratory monitoring by the radiology RN, with a total moderate sedation time of 60 minutes.  CONTRAST:  24mL OMNIPAQUE IOHEXOL 300 MG/ML  SOLN  FLUOROSCOPY TIME:  5 minutes.  42 seconds.  COMPLICATIONS: None immediate  TECHNIQUE: Informed written consent was obtained from the patient after a discussion of the risks, benefits and alternatives to treatment. Questions regarding the procedure were encouraged and answered. A timeout was performed prior to the initiation of the procedure.  Survey ultrasound of the left upper extremity was performed. The brachial DVT was localized and a more peripheral patent segment of the brachial vein was identified for access. Overlying skin site was marked.  The site was prepped and draped in  the usual sterile fashion, and a sterile drape was applied covering the operative field. Maximum barrier sterile technique with sterile gowns and gloves were used for the procedure. A timeout was performed prior to the initiation of the procedure. Local anesthesia was provided with 1% lidocaine.  Under real-time ultrasound guidance, access to the left brachial vein was achieved with a micropuncture set. Left upper extremity venography was performed. The dilator was exchanged over a Bentson for a 6 [place] vascular sheath, through which a 5 [place] Kumpe catheter was advanced to the SVC. This was exchanged for a Angiojet catheter, which allowed pulse spray delivery of 10 mg tPA in100 mL sterile saline through the left upper extremity, subclavian and innominate vein clot. The Angiojet catheter would not advance across the stent into the SVC, so right IJ access was achieved. As before, the region was prepped and draped in usual sterile fashion and ultrasound-guided micropuncture access to the right IJ was achieved. Venography was performed. After the TPA had been allowed least 20 minutes post pulse spray, the Angiojet was again utilized in thrombectomy mode to extract the thrombus from the left upper extremity, subclavian and innominate veins. Residual occlusive thrombus in the central left subclavian and innominate vein was treated with additional passes of the Angiojet thrombectomy catheter. This was exchanged for a 10 mm Mustang balloon, advanced to the confluence of the left innominate vein and SVC for dilatation of a persistent stenosis. In similar fashion, the 10 mm balloon was advanced via the right IJ access for PTA of a proximal SVC stenosis. Final venography at both sites was performed. The catheters, guidewires and sheaths were removed and hemostasis achieved at the site with manual compression. The patient tolerated the procedure well.  FINDINGS: Left upper extremity venography demonstrated occlusive clot in  the central aspect of the left brachial vein extending through the axillary vein, left subclavian vein and left innominate vein. There is tapered narrowing of the innominate vein near its confluence with the SVC. There was non opacification of the SVC. The left upper extremity, subclavian, and innominate thrombus was treated with mechanical thrombectomy after pulse spray of tPA as detailed above. No significant residual clot on final venography.  Right IJ venography demonstrated patency  of the SVC. The stent extends from the distal right innominate vein to the SVC, covering the inflow of the left innominate vein. No collateral filling was identified. There is mild narrowing of the stent at the confluence of the innominate veins.  The central left innominate vein stenosis responded well to 10 mm balloon angioplasty. Follow-up venography showed significant improvement in diameter of the vein at its confluence with the SVC at the level of the sidewall stent. The proximal SVC stenosis responded readily to 10 mm balloon angioplasty and follow-up venography showed no extravasation, residual or recurrent stenosis.  IMPRESSION: 1. Extensive occlusive left upper extremity, subclavian, and innominate vein DVT, with successful TPA assisted mechanical thrombectomy. 2. The SVC stent remains patent. Non opacification on prior CT probably secondary to injection through the port catheter. 3. The SVC stent covers the inflow from the left innominate vein. Additionally, tapered narrowing of the central left innominate vein just proximal to its confluence with the SVC suggests involvement by the patient's known recurrent tumor. Although this region responded to 10 mm balloon angioplasty, she remains at risk for recurrence of thrombosis and should probably REMAIN ON LIFELONG ANTICOAGULATION if tolerated. 4. Mild proximal SVC stent stenosis, with good response to 10 mm balloon angioplasty.   Electronically Signed   By: Lucrezia Europe M.D.    On: 12/01/2014 12:54   Ir Venocavagram Svc  12/01/2014   INDICATION: Right SVC clot and left arm brachial vein clot. 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 12 mm SVC stent placed 10/17/2014.  EXAM: THROMBOECTOMY MECHANICAL VENOUS; PTA VENOUS; LEFT EXTREMITY VENOGRAPHY; IR ULTRASOUND GUIDANCE VASC ACCESS RIGHT; IR ULTRASOUND GUIDANCE VASC ACCESS LEFT; SUPERIOR VENA CAVOGRAM  COMPARISON:  CT 11/30/2014 and earlier studies  MEDICATIONS: Intravenous Fentanyl and Versed were administered as conscious sedation during continuous cardiorespiratory monitoring by the radiology RN, with a total moderate sedation time of 60 minutes.  CONTRAST:  10mL OMNIPAQUE IOHEXOL 300 MG/ML  SOLN  FLUOROSCOPY TIME:  5 minutes.  42 seconds.  COMPLICATIONS: None immediate  TECHNIQUE: Informed written consent was obtained from the patient after a discussion of the risks, benefits and alternatives to treatment. Questions regarding the procedure were encouraged and answered. A timeout was performed prior to the initiation of the procedure.  Survey ultrasound of the left upper extremity was performed. The brachial DVT was localized and a more peripheral patent segment of the brachial vein was identified for access. Overlying skin site was marked.  The site was prepped and draped in the usual sterile fashion, and a sterile drape was applied covering the operative field. Maximum barrier sterile technique with sterile gowns and gloves were used for the procedure. A timeout was performed prior to the initiation of the procedure. Local anesthesia was provided with 1% lidocaine.  Under real-time ultrasound guidance, access to the left brachial vein was achieved with a micropuncture set. Left upper extremity venography was performed. The dilator was exchanged over a Bentson for a 6 [place] vascular sheath, through which a 5 [place] Kumpe catheter was advanced to the SVC. This was exchanged for a  Angiojet catheter, which allowed pulse spray delivery of 10 mg tPA in100 mL sterile saline through the left upper extremity, subclavian and innominate vein clot. The Angiojet catheter would not advance across the stent into the SVC, so right IJ access was achieved. As before, the region was prepped and draped in usual sterile fashion and ultrasound-guided micropuncture access to the  right IJ was achieved. Venography was performed. After the TPA had been allowed least 20 minutes post pulse spray, the Angiojet was again utilized in thrombectomy mode to extract the thrombus from the left upper extremity, subclavian and innominate veins. Residual occlusive thrombus in the central left subclavian and innominate vein was treated with additional passes of the Angiojet thrombectomy catheter. This was exchanged for a 10 mm Mustang balloon, advanced to the confluence of the left innominate vein and SVC for dilatation of a persistent stenosis. In similar fashion, the 10 mm balloon was advanced via the right IJ access for PTA of a proximal SVC stenosis. Final venography at both sites was performed. The catheters, guidewires and sheaths were removed and hemostasis achieved at the site with manual compression. The patient tolerated the procedure well.  FINDINGS: Left upper extremity venography demonstrated occlusive clot in the central aspect of the left brachial vein extending through the axillary vein, left subclavian vein and left innominate vein. There is tapered narrowing of the innominate vein near its confluence with the SVC. There was non opacification of the SVC. The left upper extremity, subclavian, and innominate thrombus was treated with mechanical thrombectomy after pulse spray of tPA as detailed above. No significant residual clot on final venography.  Right IJ venography demonstrated patency of the SVC. The stent extends from the distal right innominate vein to the SVC, covering the inflow of the left innominate  vein. No collateral filling was identified. There is mild narrowing of the stent at the confluence of the innominate veins.  The central left innominate vein stenosis responded well to 10 mm balloon angioplasty. Follow-up venography showed significant improvement in diameter of the vein at its confluence with the SVC at the level of the sidewall stent. The proximal SVC stenosis responded readily to 10 mm balloon angioplasty and follow-up venography showed no extravasation, residual or recurrent stenosis.  IMPRESSION: 1. Extensive occlusive left upper extremity, subclavian, and innominate vein DVT, with successful TPA assisted mechanical thrombectomy. 2. The SVC stent remains patent. Non opacification on prior CT probably secondary to injection through the port catheter. 3. The SVC stent covers the inflow from the left innominate vein. Additionally, tapered narrowing of the central left innominate vein just proximal to its confluence with the SVC suggests involvement by the patient's known recurrent tumor. Although this region responded to 10 mm balloon angioplasty, she remains at risk for recurrence of thrombosis and should probably REMAIN ON LIFELONG ANTICOAGULATION if tolerated. 4. Mild proximal SVC stent stenosis, with good response to 10 mm balloon angioplasty.   Electronically Signed   By: Lucrezia Europe M.D.   On: 12/01/2014 12:54   Ir Pta Venous Left  12/01/2014   INDICATION: Right SVC clot and left arm brachial vein clot. 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 12 mm SVC stent placed 10/17/2014.  EXAM: THROMBOECTOMY MECHANICAL VENOUS; PTA VENOUS; LEFT EXTREMITY VENOGRAPHY; IR ULTRASOUND GUIDANCE VASC ACCESS RIGHT; IR ULTRASOUND GUIDANCE VASC ACCESS LEFT; SUPERIOR VENA CAVOGRAM  COMPARISON:  CT 11/30/2014 and earlier studies  MEDICATIONS: Intravenous Fentanyl and Versed were administered as conscious sedation during continuous cardiorespiratory monitoring by  the radiology RN, with a total moderate sedation time of 60 minutes.  CONTRAST:  25mL OMNIPAQUE IOHEXOL 300 MG/ML  SOLN  FLUOROSCOPY TIME:  5 minutes.  42 seconds.  COMPLICATIONS: None immediate  TECHNIQUE: Informed written consent was obtained from the patient after a discussion of the risks, benefits and alternatives  to treatment. Questions regarding the procedure were encouraged and answered. A timeout was performed prior to the initiation of the procedure.  Survey ultrasound of the left upper extremity was performed. The brachial DVT was localized and a more peripheral patent segment of the brachial vein was identified for access. Overlying skin site was marked.  The site was prepped and draped in the usual sterile fashion, and a sterile drape was applied covering the operative field. Maximum barrier sterile technique with sterile gowns and gloves were used for the procedure. A timeout was performed prior to the initiation of the procedure. Local anesthesia was provided with 1% lidocaine.  Under real-time ultrasound guidance, access to the left brachial vein was achieved with a micropuncture set. Left upper extremity venography was performed. The dilator was exchanged over a Bentson for a 6 [place] vascular sheath, through which a 5 [place] Kumpe catheter was advanced to the SVC. This was exchanged for a Angiojet catheter, which allowed pulse spray delivery of 10 mg tPA in100 mL sterile saline through the left upper extremity, subclavian and innominate vein clot. The Angiojet catheter would not advance across the stent into the SVC, so right IJ access was achieved. As before, the region was prepped and draped in usual sterile fashion and ultrasound-guided micropuncture access to the right IJ was achieved. Venography was performed. After the TPA had been allowed least 20 minutes post pulse spray, the Angiojet was again utilized in thrombectomy mode to extract the thrombus from the left upper extremity, subclavian  and innominate veins. Residual occlusive thrombus in the central left subclavian and innominate vein was treated with additional passes of the Angiojet thrombectomy catheter. This was exchanged for a 10 mm Mustang balloon, advanced to the confluence of the left innominate vein and SVC for dilatation of a persistent stenosis. In similar fashion, the 10 mm balloon was advanced via the right IJ access for PTA of a proximal SVC stenosis. Final venography at both sites was performed. The catheters, guidewires and sheaths were removed and hemostasis achieved at the site with manual compression. The patient tolerated the procedure well.  FINDINGS: Left upper extremity venography demonstrated occlusive clot in the central aspect of the left brachial vein extending through the axillary vein, left subclavian vein and left innominate vein. There is tapered narrowing of the innominate vein near its confluence with the SVC. There was non opacification of the SVC. The left upper extremity, subclavian, and innominate thrombus was treated with mechanical thrombectomy after pulse spray of tPA as detailed above. No significant residual clot on final venography.  Right IJ venography demonstrated patency of the SVC. The stent extends from the distal right innominate vein to the SVC, covering the inflow of the left innominate vein. No collateral filling was identified. There is mild narrowing of the stent at the confluence of the innominate veins.  The central left innominate vein stenosis responded well to 10 mm balloon angioplasty. Follow-up venography showed significant improvement in diameter of the vein at its confluence with the SVC at the level of the sidewall stent. The proximal SVC stenosis responded readily to 10 mm balloon angioplasty and follow-up venography showed no extravasation, residual or recurrent stenosis.  IMPRESSION: 1. Extensive occlusive left upper extremity, subclavian, and innominate vein DVT, with successful  TPA assisted mechanical thrombectomy. 2. The SVC stent remains patent. Non opacification on prior CT probably secondary to injection through the port catheter. 3. The SVC stent covers the inflow from the left innominate vein. Additionally, tapered narrowing  of the central left innominate vein just proximal to its confluence with the SVC suggests involvement by the patient's known recurrent tumor. Although this region responded to 10 mm balloon angioplasty, she remains at risk for recurrence of thrombosis and should probably REMAIN ON LIFELONG ANTICOAGULATION if tolerated. 4. Mild proximal SVC stent stenosis, with good response to 10 mm balloon angioplasty.   Electronically Signed   By: Lucrezia Europe M.D.   On: 12/01/2014 12:54   Ir Pta Venous Right  12/01/2014   INDICATION: Right SVC clot and left arm brachial vein clot. 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 12 mm SVC stent placed 10/17/2014.  EXAM: THROMBOECTOMY MECHANICAL VENOUS; PTA VENOUS; LEFT EXTREMITY VENOGRAPHY; IR ULTRASOUND GUIDANCE VASC ACCESS RIGHT; IR ULTRASOUND GUIDANCE VASC ACCESS LEFT; SUPERIOR VENA CAVOGRAM  COMPARISON:  CT 11/30/2014 and earlier studies  MEDICATIONS: Intravenous Fentanyl and Versed were administered as conscious sedation during continuous cardiorespiratory monitoring by the radiology RN, with a total moderate sedation time of 60 minutes.  CONTRAST:  64mL OMNIPAQUE IOHEXOL 300 MG/ML  SOLN  FLUOROSCOPY TIME:  5 minutes.  42 seconds.  COMPLICATIONS: None immediate  TECHNIQUE: Informed written consent was obtained from the patient after a discussion of the risks, benefits and alternatives to treatment. Questions regarding the procedure were encouraged and answered. A timeout was performed prior to the initiation of the procedure.  Survey ultrasound of the left upper extremity was performed. The brachial DVT was localized and a more peripheral patent segment of the brachial vein was  identified for access. Overlying skin site was marked.  The site was prepped and draped in the usual sterile fashion, and a sterile drape was applied covering the operative field. Maximum barrier sterile technique with sterile gowns and gloves were used for the procedure. A timeout was performed prior to the initiation of the procedure. Local anesthesia was provided with 1% lidocaine.  Under real-time ultrasound guidance, access to the left brachial vein was achieved with a micropuncture set. Left upper extremity venography was performed. The dilator was exchanged over a Bentson for a 6 [place] vascular sheath, through which a 5 [place] Kumpe catheter was advanced to the SVC. This was exchanged for a Angiojet catheter, which allowed pulse spray delivery of 10 mg tPA in100 mL sterile saline through the left upper extremity, subclavian and innominate vein clot. The Angiojet catheter would not advance across the stent into the SVC, so right IJ access was achieved. As before, the region was prepped and draped in usual sterile fashion and ultrasound-guided micropuncture access to the right IJ was achieved. Venography was performed. After the TPA had been allowed least 20 minutes post pulse spray, the Angiojet was again utilized in thrombectomy mode to extract the thrombus from the left upper extremity, subclavian and innominate veins. Residual occlusive thrombus in the central left subclavian and innominate vein was treated with additional passes of the Angiojet thrombectomy catheter. This was exchanged for a 10 mm Mustang balloon, advanced to the confluence of the left innominate vein and SVC for dilatation of a persistent stenosis. In similar fashion, the 10 mm balloon was advanced via the right IJ access for PTA of a proximal SVC stenosis. Final venography at both sites was performed. The catheters, guidewires and sheaths were removed and hemostasis achieved at the site with manual compression. The patient tolerated the  procedure well.  FINDINGS: Left upper extremity venography demonstrated occlusive clot in the central aspect of the  left brachial vein extending through the axillary vein, left subclavian vein and left innominate vein. There is tapered narrowing of the innominate vein near its confluence with the SVC. There was non opacification of the SVC. The left upper extremity, subclavian, and innominate thrombus was treated with mechanical thrombectomy after pulse spray of tPA as detailed above. No significant residual clot on final venography.  Right IJ venography demonstrated patency of the SVC. The stent extends from the distal right innominate vein to the SVC, covering the inflow of the left innominate vein. No collateral filling was identified. There is mild narrowing of the stent at the confluence of the innominate veins.  The central left innominate vein stenosis responded well to 10 mm balloon angioplasty. Follow-up venography showed significant improvement in diameter of the vein at its confluence with the SVC at the level of the sidewall stent. The proximal SVC stenosis responded readily to 10 mm balloon angioplasty and follow-up venography showed no extravasation, residual or recurrent stenosis.  IMPRESSION: 1. Extensive occlusive left upper extremity, subclavian, and innominate vein DVT, with successful TPA assisted mechanical thrombectomy. 2. The SVC stent remains patent. Non opacification on prior CT probably secondary to injection through the port catheter. 3. The SVC stent covers the inflow from the left innominate vein. Additionally, tapered narrowing of the central left innominate vein just proximal to its confluence with the SVC suggests involvement by the patient's known recurrent tumor. Although this region responded to 10 mm balloon angioplasty, she remains at risk for recurrence of thrombosis and should probably REMAIN ON LIFELONG ANTICOAGULATION if tolerated. 4. Mild proximal SVC stent stenosis, with  good response to 10 mm balloon angioplasty.   Electronically Signed   By: Lucrezia Europe M.D.   On: 12/01/2014 12:54   Ir Thrombect Veno Mech Mod Sed  12/01/2014   INDICATION: Right SVC clot and left arm brachial vein clot. 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 12 mm SVC stent placed 10/17/2014.  EXAM: THROMBOECTOMY MECHANICAL VENOUS; PTA VENOUS; LEFT EXTREMITY VENOGRAPHY; IR ULTRASOUND GUIDANCE VASC ACCESS RIGHT; IR ULTRASOUND GUIDANCE VASC ACCESS LEFT; SUPERIOR VENA CAVOGRAM  COMPARISON:  CT 11/30/2014 and earlier studies  MEDICATIONS: Intravenous Fentanyl and Versed were administered as conscious sedation during continuous cardiorespiratory monitoring by the radiology RN, with a total moderate sedation time of 60 minutes.  CONTRAST:  78mL OMNIPAQUE IOHEXOL 300 MG/ML  SOLN  FLUOROSCOPY TIME:  5 minutes.  42 seconds.  COMPLICATIONS: None immediate  TECHNIQUE: Informed written consent was obtained from the patient after a discussion of the risks, benefits and alternatives to treatment. Questions regarding the procedure were encouraged and answered. A timeout was performed prior to the initiation of the procedure.  Survey ultrasound of the left upper extremity was performed. The brachial DVT was localized and a more peripheral patent segment of the brachial vein was identified for access. Overlying skin site was marked.  The site was prepped and draped in the usual sterile fashion, and a sterile drape was applied covering the operative field. Maximum barrier sterile technique with sterile gowns and gloves were used for the procedure. A timeout was performed prior to the initiation of the procedure. Local anesthesia was provided with 1% lidocaine.  Under real-time ultrasound guidance, access to the left brachial vein was achieved with a micropuncture set. Left upper extremity venography was performed. The dilator was exchanged over a Bentson for a 6 [place] vascular  sheath, through which a 5 [place]  Kumpe catheter was advanced to the SVC. This was exchanged for a Angiojet catheter, which allowed pulse spray delivery of 10 mg tPA in100 mL sterile saline through the left upper extremity, subclavian and innominate vein clot. The Angiojet catheter would not advance across the stent into the SVC, so right IJ access was achieved. As before, the region was prepped and draped in usual sterile fashion and ultrasound-guided micropuncture access to the right IJ was achieved. Venography was performed. After the TPA had been allowed least 20 minutes post pulse spray, the Angiojet was again utilized in thrombectomy mode to extract the thrombus from the left upper extremity, subclavian and innominate veins. Residual occlusive thrombus in the central left subclavian and innominate vein was treated with additional passes of the Angiojet thrombectomy catheter. This was exchanged for a 10 mm Mustang balloon, advanced to the confluence of the left innominate vein and SVC for dilatation of a persistent stenosis. In similar fashion, the 10 mm balloon was advanced via the right IJ access for PTA of a proximal SVC stenosis. Final venography at both sites was performed. The catheters, guidewires and sheaths were removed and hemostasis achieved at the site with manual compression. The patient tolerated the procedure well.  FINDINGS: Left upper extremity venography demonstrated occlusive clot in the central aspect of the left brachial vein extending through the axillary vein, left subclavian vein and left innominate vein. There is tapered narrowing of the innominate vein near its confluence with the SVC. There was non opacification of the SVC. The left upper extremity, subclavian, and innominate thrombus was treated with mechanical thrombectomy after pulse spray of tPA as detailed above. No significant residual clot on final venography.  Right IJ venography demonstrated patency of the SVC. The stent  extends from the distal right innominate vein to the SVC, covering the inflow of the left innominate vein. No collateral filling was identified. There is mild narrowing of the stent at the confluence of the innominate veins.  The central left innominate vein stenosis responded well to 10 mm balloon angioplasty. Follow-up venography showed significant improvement in diameter of the vein at its confluence with the SVC at the level of the sidewall stent. The proximal SVC stenosis responded readily to 10 mm balloon angioplasty and follow-up venography showed no extravasation, residual or recurrent stenosis.  IMPRESSION: 1. Extensive occlusive left upper extremity, subclavian, and innominate vein DVT, with successful TPA assisted mechanical thrombectomy. 2. The SVC stent remains patent. Non opacification on prior CT probably secondary to injection through the port catheter. 3. The SVC stent covers the inflow from the left innominate vein. Additionally, tapered narrowing of the central left innominate vein just proximal to its confluence with the SVC suggests involvement by the patient's known recurrent tumor. Although this region responded to 10 mm balloon angioplasty, she remains at risk for recurrence of thrombosis and should probably REMAIN ON LIFELONG ANTICOAGULATION if tolerated. 4. Mild proximal SVC stent stenosis, with good response to 10 mm balloon angioplasty.   Electronically Signed   By: Lucrezia Europe M.D.   On: 12/01/2014 12:54   Ir US Guide Vasc Access Left  12/01/2014   INDICATION: Right SVC clot and left arm brachial vein clot. 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 12 mm SVC stent placed 10/17/2014.  EXAM: THROMBOECTOMY MECHANICAL VENOUS; PTA VENOUS; LEFT EXTREMITY VENOGRAPHY; IR ULTRASOUND GUIDANCE VASC ACCESS RIGHT; IR ULTRASOUND GUIDANCE VASC ACCESS LEFT; SUPERIOR VENA CAVOGRAM  COMPARISON:  CT  11/30/2014 and earlier studies  MEDICATIONS: Intravenous  Fentanyl and Versed were administered as conscious sedation during continuous cardiorespiratory monitoring by the radiology RN, with a total moderate sedation time of 60 minutes.  CONTRAST:  70mL OMNIPAQUE IOHEXOL 300 MG/ML  SOLN  FLUOROSCOPY TIME:  5 minutes.  42 seconds.  COMPLICATIONS: None immediate  TECHNIQUE: Informed written consent was obtained from the patient after a discussion of the risks, benefits and alternatives to treatment. Questions regarding the procedure were encouraged and answered. A timeout was performed prior to the initiation of the procedure.  Survey ultrasound of the left upper extremity was performed. The brachial DVT was localized and a more peripheral patent segment of the brachial vein was identified for access. Overlying skin site was marked.  The site was prepped and draped in the usual sterile fashion, and a sterile drape was applied covering the operative field. Maximum barrier sterile technique with sterile gowns and gloves were used for the procedure. A timeout was performed prior to the initiation of the procedure. Local anesthesia was provided with 1% lidocaine.  Under real-time ultrasound guidance, access to the left brachial vein was achieved with a micropuncture set. Left upper extremity venography was performed. The dilator was exchanged over a Bentson for a 6 [place] vascular sheath, through which a 5 [place] Kumpe catheter was advanced to the SVC. This was exchanged for a Angiojet catheter, which allowed pulse spray delivery of 10 mg tPA in100 mL sterile saline through the left upper extremity, subclavian and innominate vein clot. The Angiojet catheter would not advance across the stent into the SVC, so right IJ access was achieved. As before, the region was prepped and draped in usual sterile fashion and ultrasound-guided micropuncture access to the right IJ was achieved. Venography was performed. After the TPA had been allowed least 20 minutes post pulse spray, the  Angiojet was again utilized in thrombectomy mode to extract the thrombus from the left upper extremity, subclavian and innominate veins. Residual occlusive thrombus in the central left subclavian and innominate vein was treated with additional passes of the Angiojet thrombectomy catheter. This was exchanged for a 10 mm Mustang balloon, advanced to the confluence of the left innominate vein and SVC for dilatation of a persistent stenosis. In similar fashion, the 10 mm balloon was advanced via the right IJ access for PTA of a proximal SVC stenosis. Final venography at both sites was performed. The catheters, guidewires and sheaths were removed and hemostasis achieved at the site with manual compression. The patient tolerated the procedure well.  FINDINGS: Left upper extremity venography demonstrated occlusive clot in the central aspect of the left brachial vein extending through the axillary vein, left subclavian vein and left innominate vein. There is tapered narrowing of the innominate vein near its confluence with the SVC. There was non opacification of the SVC. The left upper extremity, subclavian, and innominate thrombus was treated with mechanical thrombectomy after pulse spray of tPA as detailed above. No significant residual clot on final venography.  Right IJ venography demonstrated patency of the SVC. The stent extends from the distal right innominate vein to the SVC, covering the inflow of the left innominate vein. No collateral filling was identified. There is mild narrowing of the stent at the confluence of the innominate veins.  The central left innominate vein stenosis responded well to 10 mm balloon angioplasty. Follow-up venography showed significant improvement in diameter of the vein at its confluence with the SVC at the level of the sidewall stent.  The proximal SVC stenosis responded readily to 10 mm balloon angioplasty and follow-up venography showed no extravasation, residual or recurrent  stenosis.  IMPRESSION: 1. Extensive occlusive left upper extremity, subclavian, and innominate vein DVT, with successful TPA assisted mechanical thrombectomy. 2. The SVC stent remains patent. Non opacification on prior CT probably secondary to injection through the port catheter. 3. The SVC stent covers the inflow from the left innominate vein. Additionally, tapered narrowing of the central left innominate vein just proximal to its confluence with the SVC suggests involvement by the patient's known recurrent tumor. Although this region responded to 10 mm balloon angioplasty, she remains at risk for recurrence of thrombosis and should probably REMAIN ON LIFELONG ANTICOAGULATION if tolerated. 4. Mild proximal SVC stent stenosis, with good response to 10 mm balloon angioplasty.   Electronically Signed   By: Lucrezia Europe M.D.   On: 12/01/2014 12:54   Ir US Guide Vasc Access Right  12/01/2014   INDICATION: Right SVC clot and left arm brachial vein clot. 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 12 mm SVC stent placed 10/17/2014.  EXAM: THROMBOECTOMY MECHANICAL VENOUS; PTA VENOUS; LEFT EXTREMITY VENOGRAPHY; IR ULTRASOUND GUIDANCE VASC ACCESS RIGHT; IR ULTRASOUND GUIDANCE VASC ACCESS LEFT; SUPERIOR VENA CAVOGRAM  COMPARISON:  CT 11/30/2014 and earlier studies  MEDICATIONS: Intravenous Fentanyl and Versed were administered as conscious sedation during continuous cardiorespiratory monitoring by the radiology RN, with a total moderate sedation time of 60 minutes.  CONTRAST:  56mL OMNIPAQUE IOHEXOL 300 MG/ML  SOLN  FLUOROSCOPY TIME:  5 minutes.  42 seconds.  COMPLICATIONS: None immediate  TECHNIQUE: Informed written consent was obtained from the patient after a discussion of the risks, benefits and alternatives to treatment. Questions regarding the procedure were encouraged and answered. A timeout was performed prior to the initiation of the procedure.  Survey ultrasound of the  left upper extremity was performed. The brachial DVT was localized and a more peripheral patent segment of the brachial vein was identified for access. Overlying skin site was marked.  The site was prepped and draped in the usual sterile fashion, and a sterile drape was applied covering the operative field. Maximum barrier sterile technique with sterile gowns and gloves were used for the procedure. A timeout was performed prior to the initiation of the procedure. Local anesthesia was provided with 1% lidocaine.  Under real-time ultrasound guidance, access to the left brachial vein was achieved with a micropuncture set. Left upper extremity venography was performed. The dilator was exchanged over a Bentson for a 6 [place] vascular sheath, through which a 5 [place] Kumpe catheter was advanced to the SVC. This was exchanged for a Angiojet catheter, which allowed pulse spray delivery of 10 mg tPA in100 mL sterile saline through the left upper extremity, subclavian and innominate vein clot. The Angiojet catheter would not advance across the stent into the SVC, so right IJ access was achieved. As before, the region was prepped and draped in usual sterile fashion and ultrasound-guided micropuncture access to the right IJ was achieved. Venography was performed. After the TPA had been allowed least 20 minutes post pulse spray, the Angiojet was again utilized in thrombectomy mode to extract the thrombus from the left upper extremity, subclavian and innominate veins. Residual occlusive thrombus in the central left subclavian and innominate vein was treated with additional passes of the Angiojet thrombectomy catheter. This was exchanged for a 10 mm Mustang balloon, advanced to the confluence of the  left innominate vein and SVC for dilatation of a persistent stenosis. In similar fashion, the 10 mm balloon was advanced via the right IJ access for PTA of a proximal SVC stenosis. Final venography at both sites was performed. The  catheters, guidewires and sheaths were removed and hemostasis achieved at the site with manual compression. The patient tolerated the procedure well.  FINDINGS: Left upper extremity venography demonstrated occlusive clot in the central aspect of the left brachial vein extending through the axillary vein, left subclavian vein and left innominate vein. There is tapered narrowing of the innominate vein near its confluence with the SVC. There was non opacification of the SVC. The left upper extremity, subclavian, and innominate thrombus was treated with mechanical thrombectomy after pulse spray of tPA as detailed above. No significant residual clot on final venography.  Right IJ venography demonstrated patency of the SVC. The stent extends from the distal right innominate vein to the SVC, covering the inflow of the left innominate vein. No collateral filling was identified. There is mild narrowing of the stent at the confluence of the innominate veins.  The central left innominate vein stenosis responded well to 10 mm balloon angioplasty. Follow-up venography showed significant improvement in diameter of the vein at its confluence with the SVC at the level of the sidewall stent. The proximal SVC stenosis responded readily to 10 mm balloon angioplasty and follow-up venography showed no extravasation, residual or recurrent stenosis.  IMPRESSION: 1. Extensive occlusive left upper extremity, subclavian, and innominate vein DVT, with successful TPA assisted mechanical thrombectomy. 2. The SVC stent remains patent. Non opacification on prior CT probably secondary to injection through the port catheter. 3. The SVC stent covers the inflow from the left innominate vein. Additionally, tapered narrowing of the central left innominate vein just proximal to its confluence with the SVC suggests involvement by the patient's known recurrent tumor. Although this region responded to 10 mm balloon angioplasty, she remains at risk for  recurrence of thrombosis and should probably REMAIN ON LIFELONG ANTICOAGULATION if tolerated. 4. Mild proximal SVC stent stenosis, with good response to 10 mm balloon angioplasty.   Electronically Signed   By: Lucrezia Europe M.D.   On: 12/01/2014 12:54   Dg Chest Port 1 View  11/30/2014   CLINICAL DATA:  Lung cancer with bilateral upper extremity edema  EXAM: PORTABLE CHEST - 1 VIEW  COMPARISON:  Chest radiograph October 09, 2014 and chest CT November 03, 2014  FINDINGS: The previously noted lung carcinoma in the right upper lobe is again present measuring approximately 4.2 x 3.4 cm. Is tumor appears to extend into the right hilum, unchanged. Lungs elsewhere clear. Heart size and pulmonary vascularity are normal. No adenopathy. Port-A-Cath tip is in the superior vena cava. No pneumothorax. There is a stent in the superior vena cava region, stable.  IMPRESSION: Persistent right upper lobe mass extending in the right hilum. Stent in superior vena cava. No new opacity. No change in cardiac silhouette.   Electronically Signed   By: Lowella Grip III M.D.   On: 11/30/2014 15:49     CBC  Recent Labs Lab 11/28/14 1320 11/30/14 1450 12/01/14 0428 12/02/14 0447  WBC 5.4 4.0 4.4 5.2  HGB 11.1* 10.3* 10.2* 9.5*  HCT 34.6* 32.7* 32.0* 29.5*  PLT 207 226 209 181  MCV 83.6 83.8 84.2 81.7  MCH 26.8 26.4 26.8 26.3  MCHC 32.1 31.5 31.9 32.2  RDW 16.4* 16.7* 16.7* 16.5*  LYMPHSABS 1.1  --   --   --  MONOABS 0.9  --   --   --   EOSABS 0.1  --   --   --   BASOSABS 0.1  --   --   --     Chemistries   Recent Labs Lab 11/28/14 1320 11/30/14 1450 12/02/14 0447  NA 140 137 135  K 3.8 4.0 3.7  CL  --  102 97  CO2 22 26 30   GLUCOSE 219* 178* 173*  BUN 5.6* 7 <5*  CREATININE 0.8 0.59 0.59  CALCIUM 8.3* 8.1* 7.9*  AST 20  --   --   ALT 18  --   --   ALKPHOS 80  --   --   BILITOT 0.23  --   --     ------------------------------------------------------------------------------------------------------------------ estimated creatinine clearance is 82.2 mL/min (by C-G formula based on Cr of 0.59). ------------------------------------------------------------------------------------------------------------------ No results for input(s): HGBA1C in the last 72 hours. ------------------------------------------------------------------------------------------------------------------ No results for input(s): CHOL, HDL, LDLCALC, TRIG, CHOLHDL, LDLDIRECT in the last 72 hours. ------------------------------------------------------------------------------------------------------------------ No results for input(s): TSH, T4TOTAL, T3FREE, THYROIDAB in the last 72 hours.  Invalid input(s): FREET3 ------------------------------------------------------------------------------------------------------------------ No results for input(s): VITAMINB12, FOLATE, FERRITIN, TIBC, IRON, RETICCTPCT in the last 72 hours.  Coagulation profile  Recent Labs Lab 11/30/14 1444  INR 1.14    No results for input(s): DDIMER in the last 72 hours.  Cardiac Enzymes No results for input(s): CKMB, TROPONINI, MYOGLOBIN in the last 168 hours.  Invalid input(s): CK ------------------------------------------------------------------------------------------------------------------ Invalid input(s): POCBNP     Time Spent in minutes  35   Danne Vasek K M.D on 12/02/2014 at 9:23 AM  Between 7am to 7pm - Pager - [phone]  After 7pm go to [email or web address] - [facility] Office  [phone]

## 2014-12-02 NOTE — Progress Notes (Signed)
ANTICOAGULATION CONSULT NOTE - Follow Up Consult  Pharmacy Consult for heparin Indication: DVT  Labs:  Recent Labs  11/30/14 1444  11/30/14 1450 11/30/14 2311 12/01/14 0428 12/02/14 0447  HGB  --   < > 10.3*  --  10.2* 9.5*  HCT  --   --  32.7*  --  32.0* 29.5*  PLT  --   --  226  --  209 181  APTT 30  --   --   --   --   --   LABPROT 14.7  --   --   --   --   --   INR 1.14  --   --   --   --   --   HEPARINUNFRC  --   --   --  <0.10* 0.44  --   CREATININE  --   --  0.59  --   --  0.59  < > = values in this interval not displayed.   Assessment: 59yo female to transition back from [place] to UFH for DVT; last dose of Lovenox given 1515 yesterday.  Goal of Therapy:  Heparin level 0.3-0.7 units/ml   Plan:  Will resume heparin gtt at 1250 units/hr and monitor heparin levels and CBC.  Wynona Neat, PharmD, BCPS  12/02/2014,7:53 AM

## 2014-12-03 LAB — CBC
HCT: 26.5 % — ABNORMAL LOW (ref 36.0–46.0)
Hemoglobin: 8.6 g/dL — ABNORMAL LOW (ref 12.0–15.0)
MCH: 26.8 pg (ref 26.0–34.0)
MCHC: 32.5 g/dL (ref 30.0–36.0)
MCV: 82.6 fL (ref 78.0–100.0)
Platelets: 181 10*3/uL (ref 150–400)
RBC: 3.21 MIL/uL — ABNORMAL LOW (ref 3.87–5.11)
RDW: 16.4 % — AB (ref 11.5–15.5)
WBC: 5.4 10*3/uL (ref 4.0–10.5)

## 2014-12-03 LAB — HEMOGLOBIN AND HEMATOCRIT, BLOOD
HCT: 27.7 % — ABNORMAL LOW (ref 36.0–46.0)
Hemoglobin: 8.9 g/dL — ABNORMAL LOW (ref 12.0–15.0)

## 2014-12-03 LAB — GLUCOSE, CAPILLARY
GLUCOSE-CAPILLARY: 152 mg/dL — AB (ref 70–99)
GLUCOSE-CAPILLARY: 142 mg/dL — AB (ref 70–99)
GLUCOSE-CAPILLARY: 184 mg/dL — AB (ref 70–99)
Glucose-Capillary: 188 mg/dL — ABNORMAL HIGH (ref 70–99)

## 2014-12-03 LAB — HEPARIN LEVEL (UNFRACTIONATED)
HEPARIN UNFRACTIONATED: 0.27 [IU]/mL — AB (ref 0.30–0.70)
Heparin Unfractionated: 0.32 IU/mL (ref 0.30–0.70)

## 2014-12-03 MED ORDER — SODIUM CHLORIDE 0.9 % IJ SOLN
10.0000 mL | INTRAMUSCULAR | Status: DC | PRN
  Administered 2014-12-03 – 2014-12-06 (×5): 10 mL
  Filled 2014-12-03 (×4): qty 40

## 2014-12-03 MED ORDER — ENOXAPARIN SODIUM 120 MG/0.8ML ~~LOC~~ SOLN: 120.0000 mg | Freq: Every day | SUBCUTANEOUS | Status: DC

## 2014-12-03 MED ORDER — SODIUM CHLORIDE 0.9 % IJ SOLN
10.0000 mL | Freq: Two times a day (BID) | INTRAMUSCULAR | Status: DC
Start: 2014-12-03 — End: 2014-12-06
  Administered 2014-12-04 – 2014-12-06 (×3): 10 mL

## 2014-12-03 MED ORDER — ENOXAPARIN SODIUM 120 MG/0.8ML ~~LOC~~ SOLN
120.0000 mg | SUBCUTANEOUS | Status: DC
  Administered 2014-12-03 – 2014-12-06 (×4): 120 mg via SUBCUTANEOUS
  Filled 2014-12-03 (×4): qty 0.8

## 2014-12-03 NOTE — Progress Notes (Addendum)
ANTICOAGULATION CONSULT NOTE - Follow Up Consult  Pharmacy Consult for Heparin -> Lovenox Indication: right SVC clot and left ram brachial vein clot  Allergies  Allergen Reactions  . Sulfonamide Derivatives Rash  . Codeine Other (See Comments)    hallucinations  . Dilaudid [Hydromorphone Hcl] Nausea And Vomiting and Other (See Comments)    Room started spinning.  Pt has been okay with low doses and nausea meds administered first  . Penicillins Other (See Comments)    Childhood allergy; reaction unknown  . Vicodin [Hydrocodone-Acetaminophen] Rash    Patient Measurements: Height: 5\' 7"  (170.2 cm) Weight: 175 lb 8 oz (79.606 kg) IBW/kg (Calculated) : 61.6 Heparin Dosing Weight: 80 kg  Vital Signs: Temp: 98 F (36.7 C) (02/16 0634) Temp Source: Oral (02/16 0634) BP: 110/65 mmHg (02/16 0634) Pulse Rate: 112 (02/16 0634)  Labs:  Recent Labs  11/30/14 1444  11/30/14 1450  12/01/14 0428 12/02/14 0447 12/02/14 1733 12/03/14 0042 12/03/14 0754  HGB  --   < > 10.3*  --  10.2* 9.5*  --  8.6* 8.9*  HCT  --   < > 32.7*  --  32.0* 29.5*  --  26.5* 27.7*  PLT  --   < > 226  --  209 181  --  181  --   APTT 30  --   --   --   --   --   --   --   --   LABPROT 14.7  --   --   --   --   --   --   --   --   INR 1.14  --   --   --   --   --   --   --   --   HEPARINUNFRC  --   --   --   < > 0.44  --  0.12* 0.27* 0.32  CREATININE  --   --  0.59  --   --  0.59  --   --   --   < > = values in this interval not displayed.  Estimated Creatinine Clearance: 81.2 mL/min (by C-G formula based on Cr of 0.59).  Assessment:   Heparin level is low therapeutic (0.32) this am.   S/p TPA-assisted mechanical thrombectomy and SVC stent angioplasty on 12/01/14.   To transition back to Lovenox. No bleeding. No hematuria.   Patient reports self-administration of Lovenox x 1 prior to admission.  Goal of Therapy:  Heparin level 0.3-0.7 units/ml Anti-Xa level 0.6-1 units/ml 4hrs after LMWH dose  given Monitor platelets by anticoagulation protocol: Yes   Plan:   Stop heparin drip ~9am.  Lovenox 120 mg sq q24hrs to begin at ~10am. ~1.5 mg/kg/day, as prior to admission.  Will follow up CBC and monitor for any bleeding.  Arty Baumgartner, [place] Pager: [phone] 12/03/2014,8:48 AM

## 2014-12-03 NOTE — Care Management Note (Addendum)
    Page 1 of 1   12/05/2014     4:11:36 PM CARE MANAGEMENT NOTE 12/05/2014  Patient:  Janice Ball, Janice Ball   Account Number:  [id number]  Date Initiated:  12/03/2014  Documentation initiated by:  Simpson Paulos  Subjective/Objective Assessment:   Pt adm on 11/30/14 with large LUE clot.  PTA, pt independent of ADLS.     Action/Plan:   Pt to dc on home Lovenox.  Pt was on med prior to admission, and states she has med at home. Nurse to have pt perform self-injections while in hospital.   Anticipated DC Date:  12/07/2014   Anticipated DC Plan:  [place]  CM consult      Choice offered to / List presented to:             Status of service:  Completed, signed off Medicare Important Message given?  YES (If response is "NO", the following Medicare IM given date fields will be blank) Date Medicare IM given:  12/03/2014 Medicare IM given by:  Tikisha Molinaro Date Additional Medicare IM given:  12/05/2014 Additional Medicare IM given by:  Andy Moye  Discharge Disposition:  HOME/SELF CARE  Per UR Regulation:  Reviewed for med. necessity/level of care/duration of stay  If discussed at [place] of Stay Meetings, dates discussed:   12/05/2014    Comments:  12/05/14 Ellan Lambert, RN, BSN [phone] Pt now on IV Vancomycin for lt arm cellulitis.

## 2014-12-03 NOTE — Progress Notes (Signed)
ANTICOAGULATION CONSULT NOTE - Follow Up Consult  Pharmacy Consult for heparin Indication: DVT  Labs:  Recent Labs  11/30/14 1444  11/30/14 1450  12/01/14 0428 12/02/14 0447 12/02/14 1733 12/03/14 0042  HGB  --   < > 10.3*  --  10.2* 9.5*  --  8.6*  HCT  --   < > 32.7*  --  32.0* 29.5*  --  26.5*  PLT  --   < > 226  --  209 181  --  181  APTT 30  --   --   --   --   --   --   --   LABPROT 14.7  --   --   --   --   --   --   --   INR 1.14  --   --   --   --   --   --   --   HEPARINUNFRC  --   --   --   < > 0.44  --  0.12* 0.27*  CREATININE  --   --  0.59  --   --  0.59  --   --   < > = values in this interval not displayed.   Assessment: 59yo female remains slightly subtherapeutic on heparin after rate increase.  Goal of Therapy:  Heparin level 0.3-0.7 units/ml   Plan:  Will increase heparin gtt slightly to 1500 units/hr and check level in 6hr.  Wynona Neat, PharmD, BCPS  12/03/2014,1:27 AM

## 2014-12-03 NOTE — Progress Notes (Signed)
oob in hall ambulated ~ 100 ft on room air, sat down to 86%. Returned to >90% at rest.

## 2014-12-03 NOTE — Progress Notes (Signed)
Pt admin lovenox injection with no complications. No bleeding, no hematoma. Pt described process as she performed.

## 2014-12-03 NOTE — Progress Notes (Signed)
oob in hallway with min assist. ~ 250 feet. Family at side. No acute distress.

## 2014-12-03 NOTE — Progress Notes (Addendum)
The patient vomited one time overnight.  She says that she vomits at times due to chemo, but could not tell what brought this episode on.  She received Zofran once and did not complain of anymore nausea for the rest of the night.  She also received Robitussin for a cough and oxy IR once for neck pain.  She did not have any complaints this morning.

## 2014-12-03 NOTE — Progress Notes (Signed)
Patient Demographics  Janice Ball, is a 59 y.o. female, DOB - 1955-11-16, KGU:[id number]  Admit date - 11/30/2014   Admitting Physician Janice Odea, MD  Outpatient Primary MD for the patient is Janice Chimes, NP  LOS - 3   Chief Complaint  Patient presents with  . Arm Swelling  . Shortness of Breath  . chemo         Subjective:   Janice Ball today has, No headache, No chest pain, No abdominal pain - No Nausea, No new weakness tingling or numbness, No Cough - SOB.    Assessment & Plan    1. Right SVC Stent with suspected clot and new left arm brachial vein clot. 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 Status post venogram and TPA by IR, venogram revealed patent SVC tent on the right side and extensive left upper extremity clot burden requiring TPA, she developed some hematuria day before yesterday evening after her TPA and Lovenox.   Hematuria has resolved, discussed her case with her oncologist Dr. [name] on 12/03/2014 with switch her to Lovenox if no further hematuria discharged on Lovenox. Discussed over the phone with Dr. Bridgett Larsson vascular surgeon on 12/01/2014.    2. Right upper lobe non-small cell lung cancer. Undergoing repeat chemotherapy under Dr. [name]. Outpatient follow-up post discharge.   3. Baseline sinus tachycardia. Heart rate between 110 and 120 at baseline reviewed Dr. Worthy Flank office note. Stable.   4. Hypothyroidism. Continue Synthroid. Request PCP to check TSH 1 time on neck visit.   5. Diabetes mellitus type 2. Currently on sliding scale. We will give her home dose Amaryl once taking by mouth.  CBG (last 3)   Recent Labs  12/02/14 1729 12/02/14 2207 12/03/14 0558  GLUCAP 137* 192* 152*       Code  Status: Full  Family Communication: None  Disposition Plan: Home   Procedures CT angiogram chest, venous duplex left arm,  IR TPA-  IMPRESSION: 1. Extensive occlusive left upper extremity, subclavian, and innominate vein DVT, with successful TPA assisted mechanical thrombectomy. 2. The SVC stent remains patent. Non opacification on prior CT probably secondary to injection through the port catheter. 3. The SVC stent covers the inflow from the left innominate vein. Additionally, tapered narrowing of the central left innominate vein just proximal to its confluence with the SVC suggests involvement by the patient's known recurrent tumor. Although this region responded to 10 mm balloon angioplasty, she remains at risk for recurrence of thrombosis and should probably REMAIN ON LIFELONG ANTICOAGULATION if tolerated. 4. Mild proximal SVC stent stenosis, with good response to 10 mm balloon angioplasty.    Consults  IR, Vas Surg Dr Bridgett Larsson over the phone     Medications  Scheduled Meds: . antiseptic oral rinse  7 mL Mouth Rinse BID  . budesonide-formoterol  2 puff Inhalation BID  . enoxaparin (LOVENOX) injection  120 mg Subcutaneous Q24H  . glimepiride  2 mg Oral Q breakfast  . guaiFENesin  1,200 mg Oral BID  . insulin aspart  0-5 Units Subcutaneous QHS  . insulin aspart  0-9 Units Subcutaneous TID WC  . levothyroxine  125 mcg Oral QAC breakfast  . sertraline  150 mg  Oral QHS  . sodium chloride  10-40 mL Intracatheter Q12H  . sodium chloride  3 mL Intravenous Q12H  . spironolactone  25 mg Oral Daily   Continuous Infusions:   PRN Meds:.sodium chloride, acetaminophen, alum & mag hydroxide-simeth, guaiFENesin-dextromethorphan, HYDROcodone-acetaminophen, levalbuterol, LORazepam, morphine injection, ondansetron **OR** ondansetron (ZOFRAN) IV, oxyCODONE, prochlorperazine, sodium chloride, sodium chloride  DVT Prophylaxis    Heparin  gtt  Lab Results  Component Value Date   PLT 181 12/03/2014     Antibiotics     Anti-infectives    None          Objective:   Filed Vitals:   12/02/14 2009 12/02/14 2209 12/03/14 0634 12/03/14 0828  BP:  116/60 110/65   Pulse: 101 124 112   Temp:  98.4 F (36.9 C) 98 F (36.7 C)   TempSrc:  Oral Oral   Resp: 18 18 20    Height:      Weight:   79.606 kg (175 lb 8 oz)   SpO2: 92% 93% 98% 92%    Wt Readings from Last 3 Encounters:  12/03/14 79.606 kg (175 lb 8 oz)  11/28/14 79.606 kg (175 lb 8 oz)  11/06/14 80.74 kg (178 lb)     Intake/Output Summary (Last 24 hours) at 12/03/14 1050 Last data filed at 12/03/14 7048  Gross per 24 hour  Intake 1227.01 ml  Output   1251 ml  Net -23.99 ml     Physical Exam  Awake Alert, Oriented X 3, No new F.N deficits, Normal affect Nelson.AT,PERRAL Supple Neck,No JVD, No cervical lymphadenopathy appriciated.  Symmetrical Chest wall movement, Good air movement bilaterally, CTAB RRR,No Gallops,Rubs or new Murmurs, No Parasternal Heave +ve B.Sounds, Abd Soft, No tenderness, No organomegaly appriciated, No rebound - guarding or rigidity. No Cyanosis, Clubbing or edema, No new Rash or bruise  , L arm swollen   Data Review   Micro Results Recent Results (from the past 240 hour(s))  MRSA PCR Screening     Status: None   Collection Time: 11/30/14  8:52 PM  Result Value Ref Range Status   MRSA by PCR NEGATIVE NEGATIVE Final    Comment:        The GeneXpert MRSA Assay (FDA approved for NASAL specimens only), is one component of a comprehensive MRSA colonization surveillance program. It is not intended to diagnose MRSA infection nor to guide or monitor treatment for MRSA infections.     Radiology Reports Ct Angio Chest Pe W/cm &/or Wo Cm  11/30/2014   ADDENDUM REPORT: 11/30/2014 16:59  ADDENDUM: Critical Value/emergent results were called by telephone at the time of interpretation on 11/30/2014 at 4:56 pm to Dr. Noemi Chapel , who verbally acknowledged these results.   Electronically  Signed   By: Genevie Ann M.D.   On: 11/30/2014 16:59   11/30/2014   CLINICAL DATA:  59 year old female with shortness of breath. Positive left upper extremity DVT. History of lung cancer and COPD.   Initial encounter.  EXAM: CT ANGIOGRAPHY CHEST WITH CONTRAST  TECHNIQUE: Multidetector CT imaging of the chest was performed using the standard protocol during bolus administration of intravenous contrast. Multiplanar CT image reconstructions and MIPs were obtained to evaluate the vascular anatomy.  CONTRAST:  156mL OMNIPAQUE IOHEXOL 350 MG/ML SOLN  COMPARISON:  Chest CTA 10/09/2014.  Common chest CTA 11/03/2014.  FINDINGS: Good contrast bolus timing in the pulmonary arterial tree. No pulmonary artery filling defect consistent with pulmonary embolus.  However, severe tumor infiltration of the right hilum,  mediastinum, and right peritracheal soft tissues has resulted in SVC thrombosis. The right chest porta cath was injected common the catheter passed through a vascular stent of the SVC, which does also thrombosis (series 6, image 104). Some of the right upper lobe pulmonary arteries are significantly narrowed by tumor (image when 18).  At the same time mediastinal tumor bulk has slightly regressed since 10/09/2014 (such as on series 4, image 35 measuring 48 x 43 mm canal versus 57 x 59 mm at a comparable level previously).  No pericardial or pleural effusion. Narrowing of the carina and central right lung airways has not significantly changed. Spiculated peribronchovascular opacity in the right upper lobe is stable to slightly regressed. No areas of worsening ventilation.  Stable visualized aorta and great vessels with atherosclerosis. Stable visualized upper abdominal viscera. No acute osseous abnormality identified.  Review of the MIP images confirms the above findings.  IMPRESSION: 1. 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 And also, there is continued  narrowing of right upper lobe pulmonary arteries by mediastinal tumor. 2. Tumor bulky is stable to slightly regressed since December. 3. No new chest abnormality identified.  Electronically Signed: By: Genevie Ann M.D. On: 11/30/2014 16:53   Ct Angio Chest Pe W/cm &/or Wo Cm  11/03/2014   CLINICAL DATA:  Increased chest pain and SOB s/p recent stent placement. 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 History of COPD and lung cancer (receives chemo).Nurse note: Pt reports chest pain and sob intermittent over last few days. But consistent since last night. Sob worse with exertion. Had ?pulmonary artery stent placed 2 weeks ago. Sts "I'm not sure if the stent is even working, it feels the same as it did before the stent. 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 Denies n/v, dizziness.  EXAM: CT ANGIOGRAPHY CHEST WITH CONTRAST  TECHNIQUE: Multidetector CT imaging of the chest was performed using the standard protocol during bolus administration of intravenous contrast. Multiplanar CT image reconstructions and MIPs were obtained to evaluate the vascular anatomy.  CONTRAST:  100 cc Omnipaque 350  COMPARISON:  Chest CT dated 09/02/2014.  FINDINGS: The previously demonstrated right hilar mass is larger. The posterior extension previously measured 4.7 x 2.6 cm and currently measures 6.4 x 3.3 cm with increased vascular and bronchial encasement. The previously demonstrated 5.0 x 2.8 cm precarinal mediastinal component currently measures 5.5 x 2.3 cm. By my measurements, this previously measure 5.0 x 2.1 cm and corresponding dimensions.  A previously demonstrated 6 mm short axis right hilar lymph node is increased in size, currently A 10.5 mm short axis on image number 43. No enlarged lymph nodes elsewhere in the chest or upper abdomen.  There is a minimal pericardial effusion with a maximum thickness of 6 mm. There are multiple small, sub cm nodules in both lungs. Some of these are new and some are enlarging. An  old, healed right anterolateral rib fracture is again demonstrated. No bone metastases are visualized.  The pulmonary arteries are normally opacified with no pulmonary arterial filling defects seen. Moderate thoracic scoliosis is noted.  Review of the MIP images confirms the above findings.  IMPRESSION: 1.  No pulmonary emboli.  2. Enlarging right hilar and mediastinal lung cancer with increased vascular and bronchial encasement.  3. Multiple sub cm new and enlarging nodules in both lungs. These are suspicious for early metastases.  4.  Progressive right hilar metastatic adenopathy.   Electronically Signed  By: Enrique Sack M.D.   On: 11/03/2014 14:10   Ir Veno/ext/uni Left  12/01/2014   INDICATION: Right SVC clot and left arm brachial vein clot. 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 12 mm SVC stent placed 10/17/2014.  EXAM: THROMBOECTOMY MECHANICAL VENOUS; PTA VENOUS; LEFT EXTREMITY VENOGRAPHY; IR ULTRASOUND GUIDANCE VASC ACCESS RIGHT; IR ULTRASOUND GUIDANCE VASC ACCESS LEFT; SUPERIOR VENA CAVOGRAM  COMPARISON:  CT 11/30/2014 and earlier studies  MEDICATIONS: Intravenous Fentanyl and Versed were administered as conscious sedation during continuous cardiorespiratory monitoring by the radiology RN, with a total moderate sedation time of 60 minutes.  CONTRAST:  63mL OMNIPAQUE IOHEXOL 300 MG/ML  SOLN  FLUOROSCOPY TIME:  5 minutes.  42 seconds.  COMPLICATIONS: None immediate  TECHNIQUE: Informed written consent was obtained from the patient after a discussion of the risks, benefits and alternatives to treatment. Questions regarding the procedure were encouraged and answered. A timeout was performed prior to the initiation of the procedure.  Survey ultrasound of the left upper extremity was performed. The brachial DVT was localized and a more peripheral patent segment of the brachial vein was identified for access. Overlying skin site was marked.  The site was  prepped and draped in the usual sterile fashion, and a sterile drape was applied covering the operative field. Maximum barrier sterile technique with sterile gowns and gloves were used for the procedure. A timeout was performed prior to the initiation of the procedure. Local anesthesia was provided with 1% lidocaine.  Under real-time ultrasound guidance, access to the left brachial vein was achieved with a micropuncture set. Left upper extremity venography was performed. The dilator was exchanged over a Bentson for a 6 [place] vascular sheath, through which a 5 [place] Kumpe catheter was advanced to the SVC. This was exchanged for a Angiojet catheter, which allowed pulse spray delivery of 10 mg tPA in100 mL sterile saline through the left upper extremity, subclavian and innominate vein clot. The Angiojet catheter would not advance across the stent into the SVC, so right IJ access was achieved. As before, the region was prepped and draped in usual sterile fashion and ultrasound-guided micropuncture access to the right IJ was achieved. Venography was performed. After the TPA had been allowed least 20 minutes post pulse spray, the Angiojet was again utilized in thrombectomy mode to extract the thrombus from the left upper extremity, subclavian and innominate veins. Residual occlusive thrombus in the central left subclavian and innominate vein was treated with additional passes of the Angiojet thrombectomy catheter. This was exchanged for a 10 mm Mustang balloon, advanced to the confluence of the left innominate vein and SVC for dilatation of a persistent stenosis. In similar fashion, the 10 mm balloon was advanced via the right IJ access for PTA of a proximal SVC stenosis. Final venography at both sites was performed. The catheters, guidewires and sheaths were removed and hemostasis achieved at the site with manual compression. The patient tolerated the procedure well.  FINDINGS: Left upper extremity venography  demonstrated occlusive clot in the central aspect of the left brachial vein extending through the axillary vein, left subclavian vein and left innominate vein. There is tapered narrowing of the innominate vein near its confluence with the SVC. There was non opacification of the SVC. The left upper extremity, subclavian, and innominate thrombus was treated with mechanical thrombectomy after pulse spray of tPA as detailed above. No significant residual clot on final venography.  Right IJ venography demonstrated  patency of the SVC. The stent extends from the distal right innominate vein to the SVC, covering the inflow of the left innominate vein. No collateral filling was identified. There is mild narrowing of the stent at the confluence of the innominate veins.  The central left innominate vein stenosis responded well to 10 mm balloon angioplasty. Follow-up venography showed significant improvement in diameter of the vein at its confluence with the SVC at the level of the sidewall stent. The proximal SVC stenosis responded readily to 10 mm balloon angioplasty and follow-up venography showed no extravasation, residual or recurrent stenosis.  IMPRESSION: 1. Extensive occlusive left upper extremity, subclavian, and innominate vein DVT, with successful TPA assisted mechanical thrombectomy. 2. The SVC stent remains patent. Non opacification on prior CT probably secondary to injection through the port catheter. 3. The SVC stent covers the inflow from the left innominate vein. Additionally, tapered narrowing of the central left innominate vein just proximal to its confluence with the SVC suggests involvement by the patient's known recurrent tumor. Although this region responded to 10 mm balloon angioplasty, she remains at risk for recurrence of thrombosis and should probably REMAIN ON LIFELONG ANTICOAGULATION if tolerated. 4. Mild proximal SVC stent stenosis, with good response to 10 mm balloon angioplasty.   Electronically  Signed   By: Lucrezia Europe M.D.   On: 12/01/2014 12:54   Ir Venocavagram Svc  12/01/2014   INDICATION: Right SVC clot and left arm brachial vein clot. 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 12 mm SVC stent placed 10/17/2014.  EXAM: THROMBOECTOMY MECHANICAL VENOUS; PTA VENOUS; LEFT EXTREMITY VENOGRAPHY; IR ULTRASOUND GUIDANCE VASC ACCESS RIGHT; IR ULTRASOUND GUIDANCE VASC ACCESS LEFT; SUPERIOR VENA CAVOGRAM  COMPARISON:  CT 11/30/2014 and earlier studies  MEDICATIONS: Intravenous Fentanyl and Versed were administered as conscious sedation during continuous cardiorespiratory monitoring by the radiology RN, with a total moderate sedation time of 60 minutes.  CONTRAST:  28mL OMNIPAQUE IOHEXOL 300 MG/ML  SOLN  FLUOROSCOPY TIME:  5 minutes.  42 seconds.  COMPLICATIONS: None immediate  TECHNIQUE: Informed written consent was obtained from the patient after a discussion of the risks, benefits and alternatives to treatment. Questions regarding the procedure were encouraged and answered. A timeout was performed prior to the initiation of the procedure.  Survey ultrasound of the left upper extremity was performed. The brachial DVT was localized and a more peripheral patent segment of the brachial vein was identified for access. Overlying skin site was marked.  The site was prepped and draped in the usual sterile fashion, and a sterile drape was applied covering the operative field. Maximum barrier sterile technique with sterile gowns and gloves were used for the procedure. A timeout was performed prior to the initiation of the procedure. Local anesthesia was provided with 1% lidocaine.  Under real-time ultrasound guidance, access to the left brachial vein was achieved with a micropuncture set. Left upper extremity venography was performed. The dilator was exchanged over a Bentson for a 6 [place] vascular sheath, through which a 5 [place] Kumpe catheter was advanced to the SVC.  This was exchanged for a Angiojet catheter, which allowed pulse spray delivery of 10 mg tPA in100 mL sterile saline through the left upper extremity, subclavian and innominate vein clot. The Angiojet catheter would not advance across the stent into the SVC, so right IJ access was achieved. As before, the region was prepped and draped in usual sterile fashion and ultrasound-guided micropuncture access to  the right IJ was achieved. Venography was performed. After the TPA had been allowed least 20 minutes post pulse spray, the Angiojet was again utilized in thrombectomy mode to extract the thrombus from the left upper extremity, subclavian and innominate veins. Residual occlusive thrombus in the central left subclavian and innominate vein was treated with additional passes of the Angiojet thrombectomy catheter. This was exchanged for a 10 mm Mustang balloon, advanced to the confluence of the left innominate vein and SVC for dilatation of a persistent stenosis. In similar fashion, the 10 mm balloon was advanced via the right IJ access for PTA of a proximal SVC stenosis. Final venography at both sites was performed. The catheters, guidewires and sheaths were removed and hemostasis achieved at the site with manual compression. The patient tolerated the procedure well.  FINDINGS: Left upper extremity venography demonstrated occlusive clot in the central aspect of the left brachial vein extending through the axillary vein, left subclavian vein and left innominate vein. There is tapered narrowing of the innominate vein near its confluence with the SVC. There was non opacification of the SVC. The left upper extremity, subclavian, and innominate thrombus was treated with mechanical thrombectomy after pulse spray of tPA as detailed above. No significant residual clot on final venography.  Right IJ venography demonstrated patency of the SVC. The stent extends from the distal right innominate vein to the SVC, covering the inflow  of the left innominate vein. No collateral filling was identified. There is mild narrowing of the stent at the confluence of the innominate veins.  The central left innominate vein stenosis responded well to 10 mm balloon angioplasty. Follow-up venography showed significant improvement in diameter of the vein at its confluence with the SVC at the level of the sidewall stent. The proximal SVC stenosis responded readily to 10 mm balloon angioplasty and follow-up venography showed no extravasation, residual or recurrent stenosis.  IMPRESSION: 1. Extensive occlusive left upper extremity, subclavian, and innominate vein DVT, with successful TPA assisted mechanical thrombectomy. 2. The SVC stent remains patent. Non opacification on prior CT probably secondary to injection through the port catheter. 3. The SVC stent covers the inflow from the left innominate vein. Additionally, tapered narrowing of the central left innominate vein just proximal to its confluence with the SVC suggests involvement by the patient's known recurrent tumor. Although this region responded to 10 mm balloon angioplasty, she remains at risk for recurrence of thrombosis and should probably REMAIN ON LIFELONG ANTICOAGULATION if tolerated. 4. Mild proximal SVC stent stenosis, with good response to 10 mm balloon angioplasty.   Electronically Signed   By: Lucrezia Europe M.D.   On: 12/01/2014 12:54   Ir Pta Venous Left  12/01/2014   INDICATION: Right SVC clot and left arm brachial vein clot. 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 12 mm SVC stent placed 10/17/2014.  EXAM: THROMBOECTOMY MECHANICAL VENOUS; PTA VENOUS; LEFT EXTREMITY VENOGRAPHY; IR ULTRASOUND GUIDANCE VASC ACCESS RIGHT; IR ULTRASOUND GUIDANCE VASC ACCESS LEFT; SUPERIOR VENA CAVOGRAM  COMPARISON:  CT 11/30/2014 and earlier studies  MEDICATIONS: Intravenous Fentanyl and Versed were administered as conscious sedation during continuous  cardiorespiratory monitoring by the radiology RN, with a total moderate sedation time of 60 minutes.  CONTRAST:  59mL OMNIPAQUE IOHEXOL 300 MG/ML  SOLN  FLUOROSCOPY TIME:  5 minutes.  42 seconds.  COMPLICATIONS: None immediate  TECHNIQUE: Informed written consent was obtained from the patient after a discussion of the risks, benefits and  alternatives to treatment. Questions regarding the procedure were encouraged and answered. A timeout was performed prior to the initiation of the procedure.  Survey ultrasound of the left upper extremity was performed. The brachial DVT was localized and a more peripheral patent segment of the brachial vein was identified for access. Overlying skin site was marked.  The site was prepped and draped in the usual sterile fashion, and a sterile drape was applied covering the operative field. Maximum barrier sterile technique with sterile gowns and gloves were used for the procedure. A timeout was performed prior to the initiation of the procedure. Local anesthesia was provided with 1% lidocaine.  Under real-time ultrasound guidance, access to the left brachial vein was achieved with a micropuncture set. Left upper extremity venography was performed. The dilator was exchanged over a Bentson for a 6 [place] vascular sheath, through which a 5 [place] Kumpe catheter was advanced to the SVC. This was exchanged for a Angiojet catheter, which allowed pulse spray delivery of 10 mg tPA in100 mL sterile saline through the left upper extremity, subclavian and innominate vein clot. The Angiojet catheter would not advance across the stent into the SVC, so right IJ access was achieved. As before, the region was prepped and draped in usual sterile fashion and ultrasound-guided micropuncture access to the right IJ was achieved. Venography was performed. After the TPA had been allowed least 20 minutes post pulse spray, the Angiojet was again utilized in thrombectomy mode to extract the thrombus from the  left upper extremity, subclavian and innominate veins. Residual occlusive thrombus in the central left subclavian and innominate vein was treated with additional passes of the Angiojet thrombectomy catheter. This was exchanged for a 10 mm Mustang balloon, advanced to the confluence of the left innominate vein and SVC for dilatation of a persistent stenosis. In similar fashion, the 10 mm balloon was advanced via the right IJ access for PTA of a proximal SVC stenosis. Final venography at both sites was performed. The catheters, guidewires and sheaths were removed and hemostasis achieved at the site with manual compression. The patient tolerated the procedure well.  FINDINGS: Left upper extremity venography demonstrated occlusive clot in the central aspect of the left brachial vein extending through the axillary vein, left subclavian vein and left innominate vein. There is tapered narrowing of the innominate vein near its confluence with the SVC. There was non opacification of the SVC. The left upper extremity, subclavian, and innominate thrombus was treated with mechanical thrombectomy after pulse spray of tPA as detailed above. No significant residual clot on final venography.  Right IJ venography demonstrated patency of the SVC. The stent extends from the distal right innominate vein to the SVC, covering the inflow of the left innominate vein. No collateral filling was identified. There is mild narrowing of the stent at the confluence of the innominate veins.  The central left innominate vein stenosis responded well to 10 mm balloon angioplasty. Follow-up venography showed significant improvement in diameter of the vein at its confluence with the SVC at the level of the sidewall stent. The proximal SVC stenosis responded readily to 10 mm balloon angioplasty and follow-up venography showed no extravasation, residual or recurrent stenosis.  IMPRESSION: 1. Extensive occlusive left upper extremity, subclavian, and  innominate vein DVT, with successful TPA assisted mechanical thrombectomy. 2. The SVC stent remains patent. Non opacification on prior CT probably secondary to injection through the port catheter. 3. The SVC stent covers the inflow from the left innominate vein. Additionally, tapered  narrowing of the central left innominate vein just proximal to its confluence with the SVC suggests involvement by the patient's known recurrent tumor. Although this region responded to 10 mm balloon angioplasty, she remains at risk for recurrence of thrombosis and should probably REMAIN ON LIFELONG ANTICOAGULATION if tolerated. 4. Mild proximal SVC stent stenosis, with good response to 10 mm balloon angioplasty.   Electronically Signed   By: Lucrezia Europe M.D.   On: 12/01/2014 12:54   Ir Pta Venous Right  12/01/2014   INDICATION: Right SVC clot and left arm brachial vein clot. 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 12 mm SVC stent placed 10/17/2014.  EXAM: THROMBOECTOMY MECHANICAL VENOUS; PTA VENOUS; LEFT EXTREMITY VENOGRAPHY; IR ULTRASOUND GUIDANCE VASC ACCESS RIGHT; IR ULTRASOUND GUIDANCE VASC ACCESS LEFT; SUPERIOR VENA CAVOGRAM  COMPARISON:  CT 11/30/2014 and earlier studies  MEDICATIONS: Intravenous Fentanyl and Versed were administered as conscious sedation during continuous cardiorespiratory monitoring by the radiology RN, with a total moderate sedation time of 60 minutes.  CONTRAST:  57mL OMNIPAQUE IOHEXOL 300 MG/ML  SOLN  FLUOROSCOPY TIME:  5 minutes.  42 seconds.  COMPLICATIONS: None immediate  TECHNIQUE: Informed written consent was obtained from the patient after a discussion of the risks, benefits and alternatives to treatment. Questions regarding the procedure were encouraged and answered. A timeout was performed prior to the initiation of the procedure.  Survey ultrasound of the left upper extremity was performed. The brachial DVT was localized and a more peripheral  patent segment of the brachial vein was identified for access. Overlying skin site was marked.  The site was prepped and draped in the usual sterile fashion, and a sterile drape was applied covering the operative field. Maximum barrier sterile technique with sterile gowns and gloves were used for the procedure. A timeout was performed prior to the initiation of the procedure. Local anesthesia was provided with 1% lidocaine.  Under real-time ultrasound guidance, access to the left brachial vein was achieved with a micropuncture set. Left upper extremity venography was performed. The dilator was exchanged over a Bentson for a 6 [place] vascular sheath, through which a 5 [place] Kumpe catheter was advanced to the SVC. This was exchanged for a Angiojet catheter, which allowed pulse spray delivery of 10 mg tPA in100 mL sterile saline through the left upper extremity, subclavian and innominate vein clot. The Angiojet catheter would not advance across the stent into the SVC, so right IJ access was achieved. As before, the region was prepped and draped in usual sterile fashion and ultrasound-guided micropuncture access to the right IJ was achieved. Venography was performed. After the TPA had been allowed least 20 minutes post pulse spray, the Angiojet was again utilized in thrombectomy mode to extract the thrombus from the left upper extremity, subclavian and innominate veins. Residual occlusive thrombus in the central left subclavian and innominate vein was treated with additional passes of the Angiojet thrombectomy catheter. This was exchanged for a 10 mm Mustang balloon, advanced to the confluence of the left innominate vein and SVC for dilatation of a persistent stenosis. In similar fashion, the 10 mm balloon was advanced via the right IJ access for PTA of a proximal SVC stenosis. Final venography at both sites was performed. The catheters, guidewires and sheaths were removed and hemostasis achieved at the site with  manual compression. The patient tolerated the procedure well.  FINDINGS: Left upper extremity venography demonstrated occlusive clot in the central aspect of  the left brachial vein extending through the axillary vein, left subclavian vein and left innominate vein. There is tapered narrowing of the innominate vein near its confluence with the SVC. There was non opacification of the SVC. The left upper extremity, subclavian, and innominate thrombus was treated with mechanical thrombectomy after pulse spray of tPA as detailed above. No significant residual clot on final venography.  Right IJ venography demonstrated patency of the SVC. The stent extends from the distal right innominate vein to the SVC, covering the inflow of the left innominate vein. No collateral filling was identified. There is mild narrowing of the stent at the confluence of the innominate veins.  The central left innominate vein stenosis responded well to 10 mm balloon angioplasty. Follow-up venography showed significant improvement in diameter of the vein at its confluence with the SVC at the level of the sidewall stent. The proximal SVC stenosis responded readily to 10 mm balloon angioplasty and follow-up venography showed no extravasation, residual or recurrent stenosis.  IMPRESSION: 1. Extensive occlusive left upper extremity, subclavian, and innominate vein DVT, with successful TPA assisted mechanical thrombectomy. 2. The SVC stent remains patent. Non opacification on prior CT probably secondary to injection through the port catheter. 3. The SVC stent covers the inflow from the left innominate vein. Additionally, tapered narrowing of the central left innominate vein just proximal to its confluence with the SVC suggests involvement by the patient's known recurrent tumor. Although this region responded to 10 mm balloon angioplasty, she remains at risk for recurrence of thrombosis and should probably REMAIN ON LIFELONG ANTICOAGULATION if  tolerated. 4. Mild proximal SVC stent stenosis, with good response to 10 mm balloon angioplasty.   Electronically Signed   By: Lucrezia Europe M.D.   On: 12/01/2014 12:54   Ir Thrombect Veno Mech Mod Sed  12/01/2014   INDICATION: Right SVC clot and left arm brachial vein clot. 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 12 mm SVC stent placed 10/17/2014.  EXAM: THROMBOECTOMY MECHANICAL VENOUS; PTA VENOUS; LEFT EXTREMITY VENOGRAPHY; IR ULTRASOUND GUIDANCE VASC ACCESS RIGHT; IR ULTRASOUND GUIDANCE VASC ACCESS LEFT; SUPERIOR VENA CAVOGRAM  COMPARISON:  CT 11/30/2014 and earlier studies  MEDICATIONS: Intravenous Fentanyl and Versed were administered as conscious sedation during continuous cardiorespiratory monitoring by the radiology RN, with a total moderate sedation time of 60 minutes.  CONTRAST:  19mL OMNIPAQUE IOHEXOL 300 MG/ML  SOLN  FLUOROSCOPY TIME:  5 minutes.  42 seconds.  COMPLICATIONS: None immediate  TECHNIQUE: Informed written consent was obtained from the patient after a discussion of the risks, benefits and alternatives to treatment. Questions regarding the procedure were encouraged and answered. A timeout was performed prior to the initiation of the procedure.  Survey ultrasound of the left upper extremity was performed. The brachial DVT was localized and a more peripheral patent segment of the brachial vein was identified for access. Overlying skin site was marked.  The site was prepped and draped in the usual sterile fashion, and a sterile drape was applied covering the operative field. Maximum barrier sterile technique with sterile gowns and gloves were used for the procedure. A timeout was performed prior to the initiation of the procedure. Local anesthesia was provided with 1% lidocaine.  Under real-time ultrasound guidance, access to the left brachial vein was achieved with a micropuncture set. Left upper extremity venography was performed. The dilator  was exchanged over a Bentson for a 6 [place] vascular sheath, through which a 5  [place] Kumpe catheter was advanced to the SVC. This was exchanged for a Angiojet catheter, which allowed pulse spray delivery of 10 mg tPA in100 mL sterile saline through the left upper extremity, subclavian and innominate vein clot. The Angiojet catheter would not advance across the stent into the SVC, so right IJ access was achieved. As before, the region was prepped and draped in usual sterile fashion and ultrasound-guided micropuncture access to the right IJ was achieved. Venography was performed. After the TPA had been allowed least 20 minutes post pulse spray, the Angiojet was again utilized in thrombectomy mode to extract the thrombus from the left upper extremity, subclavian and innominate veins. Residual occlusive thrombus in the central left subclavian and innominate vein was treated with additional passes of the Angiojet thrombectomy catheter. This was exchanged for a 10 mm Mustang balloon, advanced to the confluence of the left innominate vein and SVC for dilatation of a persistent stenosis. In similar fashion, the 10 mm balloon was advanced via the right IJ access for PTA of a proximal SVC stenosis. Final venography at both sites was performed. The catheters, guidewires and sheaths were removed and hemostasis achieved at the site with manual compression. The patient tolerated the procedure well.  FINDINGS: Left upper extremity venography demonstrated occlusive clot in the central aspect of the left brachial vein extending through the axillary vein, left subclavian vein and left innominate vein. There is tapered narrowing of the innominate vein near its confluence with the SVC. There was non opacification of the SVC. The left upper extremity, subclavian, and innominate thrombus was treated with mechanical thrombectomy after pulse spray of tPA as detailed above. No significant residual clot on final venography.  Right IJ  venography demonstrated patency of the SVC. The stent extends from the distal right innominate vein to the SVC, covering the inflow of the left innominate vein. No collateral filling was identified. There is mild narrowing of the stent at the confluence of the innominate veins.  The central left innominate vein stenosis responded well to 10 mm balloon angioplasty. Follow-up venography showed significant improvement in diameter of the vein at its confluence with the SVC at the level of the sidewall stent. The proximal SVC stenosis responded readily to 10 mm balloon angioplasty and follow-up venography showed no extravasation, residual or recurrent stenosis.  IMPRESSION: 1. Extensive occlusive left upper extremity, subclavian, and innominate vein DVT, with successful TPA assisted mechanical thrombectomy. 2. The SVC stent remains patent. Non opacification on prior CT probably secondary to injection through the port catheter. 3. The SVC stent covers the inflow from the left innominate vein. Additionally, tapered narrowing of the central left innominate vein just proximal to its confluence with the SVC suggests involvement by the patient's known recurrent tumor. Although this region responded to 10 mm balloon angioplasty, she remains at risk for recurrence of thrombosis and should probably REMAIN ON LIFELONG ANTICOAGULATION if tolerated. 4. Mild proximal SVC stent stenosis, with good response to 10 mm balloon angioplasty.   Electronically Signed   By: Lucrezia Europe M.D.   On: 12/01/2014 12:54   Ir US Guide Vasc Access Left  12/01/2014   INDICATION: Right SVC clot and left arm brachial vein clot. 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 12 mm SVC stent placed 10/17/2014.  EXAM: THROMBOECTOMY MECHANICAL VENOUS; PTA VENOUS; LEFT EXTREMITY VENOGRAPHY; IR ULTRASOUND GUIDANCE VASC ACCESS RIGHT; IR ULTRASOUND GUIDANCE VASC ACCESS LEFT; SUPERIOR VENA CAVOGRAM  COMPARISON:  CT  11/30/2014 and earlier studies  MEDICATIONS: Intravenous Fentanyl and Versed were administered as conscious sedation during continuous cardiorespiratory monitoring by the radiology RN, with a total moderate sedation time of 60 minutes.  CONTRAST:  34mL OMNIPAQUE IOHEXOL 300 MG/ML  SOLN  FLUOROSCOPY TIME:  5 minutes.  42 seconds.  COMPLICATIONS: None immediate  TECHNIQUE: Informed written consent was obtained from the patient after a discussion of the risks, benefits and alternatives to treatment. Questions regarding the procedure were encouraged and answered. A timeout was performed prior to the initiation of the procedure.  Survey ultrasound of the left upper extremity was performed. The brachial DVT was localized and a more peripheral patent segment of the brachial vein was identified for access. Overlying skin site was marked.  The site was prepped and draped in the usual sterile fashion, and a sterile drape was applied covering the operative field. Maximum barrier sterile technique with sterile gowns and gloves were used for the procedure. A timeout was performed prior to the initiation of the procedure. Local anesthesia was provided with 1% lidocaine.  Under real-time ultrasound guidance, access to the left brachial vein was achieved with a micropuncture set. Left upper extremity venography was performed. The dilator was exchanged over a Bentson for a 6 [place] vascular sheath, through which a 5 [place] Kumpe catheter was advanced to the SVC. This was exchanged for a Angiojet catheter, which allowed pulse spray delivery of 10 mg tPA in100 mL sterile saline through the left upper extremity, subclavian and innominate vein clot. The Angiojet catheter would not advance across the stent into the SVC, so right IJ access was achieved. As before, the region was prepped and draped in usual sterile fashion and ultrasound-guided micropuncture access to the right IJ was achieved. Venography was performed. After the TPA had  been allowed least 20 minutes post pulse spray, the Angiojet was again utilized in thrombectomy mode to extract the thrombus from the left upper extremity, subclavian and innominate veins. Residual occlusive thrombus in the central left subclavian and innominate vein was treated with additional passes of the Angiojet thrombectomy catheter. This was exchanged for a 10 mm Mustang balloon, advanced to the confluence of the left innominate vein and SVC for dilatation of a persistent stenosis. In similar fashion, the 10 mm balloon was advanced via the right IJ access for PTA of a proximal SVC stenosis. Final venography at both sites was performed. The catheters, guidewires and sheaths were removed and hemostasis achieved at the site with manual compression. The patient tolerated the procedure well.  FINDINGS: Left upper extremity venography demonstrated occlusive clot in the central aspect of the left brachial vein extending through the axillary vein, left subclavian vein and left innominate vein. There is tapered narrowing of the innominate vein near its confluence with the SVC. There was non opacification of the SVC. The left upper extremity, subclavian, and innominate thrombus was treated with mechanical thrombectomy after pulse spray of tPA as detailed above. No significant residual clot on final venography.  Right IJ venography demonstrated patency of the SVC. The stent extends from the distal right innominate vein to the SVC, covering the inflow of the left innominate vein. No collateral filling was identified. There is mild narrowing of the stent at the confluence of the innominate veins.  The central left innominate vein stenosis responded well to 10 mm balloon angioplasty. Follow-up venography showed significant improvement in diameter of the vein at its confluence with the SVC at the level of the sidewall  stent. The proximal SVC stenosis responded readily to 10 mm balloon angioplasty and follow-up venography  showed no extravasation, residual or recurrent stenosis.  IMPRESSION: 1. Extensive occlusive left upper extremity, subclavian, and innominate vein DVT, with successful TPA assisted mechanical thrombectomy. 2. The SVC stent remains patent. Non opacification on prior CT probably secondary to injection through the port catheter. 3. The SVC stent covers the inflow from the left innominate vein. Additionally, tapered narrowing of the central left innominate vein just proximal to its confluence with the SVC suggests involvement by the patient's known recurrent tumor. Although this region responded to 10 mm balloon angioplasty, she remains at risk for recurrence of thrombosis and should probably REMAIN ON LIFELONG ANTICOAGULATION if tolerated. 4. Mild proximal SVC stent stenosis, with good response to 10 mm balloon angioplasty.   Electronically Signed   By: Lucrezia Europe M.D.   On: 12/01/2014 12:54   Ir US Guide Vasc Access Right  12/01/2014   INDICATION: Right SVC clot and left arm brachial vein clot. 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 12 mm SVC stent placed 10/17/2014.  EXAM: THROMBOECTOMY MECHANICAL VENOUS; PTA VENOUS; LEFT EXTREMITY VENOGRAPHY; IR ULTRASOUND GUIDANCE VASC ACCESS RIGHT; IR ULTRASOUND GUIDANCE VASC ACCESS LEFT; SUPERIOR VENA CAVOGRAM  COMPARISON:  CT 11/30/2014 and earlier studies  MEDICATIONS: Intravenous Fentanyl and Versed were administered as conscious sedation during continuous cardiorespiratory monitoring by the radiology RN, with a total moderate sedation time of 60 minutes.  CONTRAST:  74mL OMNIPAQUE IOHEXOL 300 MG/ML  SOLN  FLUOROSCOPY TIME:  5 minutes.  42 seconds.  COMPLICATIONS: None immediate  TECHNIQUE: Informed written consent was obtained from the patient after a discussion of the risks, benefits and alternatives to treatment. Questions regarding the procedure were encouraged and answered. A timeout was performed prior to the  initiation of the procedure.  Survey ultrasound of the left upper extremity was performed. The brachial DVT was localized and a more peripheral patent segment of the brachial vein was identified for access. Overlying skin site was marked.  The site was prepped and draped in the usual sterile fashion, and a sterile drape was applied covering the operative field. Maximum barrier sterile technique with sterile gowns and gloves were used for the procedure. A timeout was performed prior to the initiation of the procedure. Local anesthesia was provided with 1% lidocaine.  Under real-time ultrasound guidance, access to the left brachial vein was achieved with a micropuncture set. Left upper extremity venography was performed. The dilator was exchanged over a Bentson for a 6 [place] vascular sheath, through which a 5 [place] Kumpe catheter was advanced to the SVC. This was exchanged for a Angiojet catheter, which allowed pulse spray delivery of 10 mg tPA in100 mL sterile saline through the left upper extremity, subclavian and innominate vein clot. The Angiojet catheter would not advance across the stent into the SVC, so right IJ access was achieved. As before, the region was prepped and draped in usual sterile fashion and ultrasound-guided micropuncture access to the right IJ was achieved. Venography was performed. After the TPA had been allowed least 20 minutes post pulse spray, the Angiojet was again utilized in thrombectomy mode to extract the thrombus from the left upper extremity, subclavian and innominate veins. Residual occlusive thrombus in the central left subclavian and innominate vein was treated with additional passes of the Angiojet thrombectomy catheter. This was exchanged for a 10 mm Mustang balloon, advanced to the confluence of  the left innominate vein and SVC for dilatation of a persistent stenosis. In similar fashion, the 10 mm balloon was advanced via the right IJ access for PTA of a proximal SVC stenosis.  Final venography at both sites was performed. The catheters, guidewires and sheaths were removed and hemostasis achieved at the site with manual compression. The patient tolerated the procedure well.  FINDINGS: Left upper extremity venography demonstrated occlusive clot in the central aspect of the left brachial vein extending through the axillary vein, left subclavian vein and left innominate vein. There is tapered narrowing of the innominate vein near its confluence with the SVC. There was non opacification of the SVC. The left upper extremity, subclavian, and innominate thrombus was treated with mechanical thrombectomy after pulse spray of tPA as detailed above. No significant residual clot on final venography.  Right IJ venography demonstrated patency of the SVC. The stent extends from the distal right innominate vein to the SVC, covering the inflow of the left innominate vein. No collateral filling was identified. There is mild narrowing of the stent at the confluence of the innominate veins.  The central left innominate vein stenosis responded well to 10 mm balloon angioplasty. Follow-up venography showed significant improvement in diameter of the vein at its confluence with the SVC at the level of the sidewall stent. The proximal SVC stenosis responded readily to 10 mm balloon angioplasty and follow-up venography showed no extravasation, residual or recurrent stenosis.  IMPRESSION: 1. Extensive occlusive left upper extremity, subclavian, and innominate vein DVT, with successful TPA assisted mechanical thrombectomy. 2. The SVC stent remains patent. Non opacification on prior CT probably secondary to injection through the port catheter. 3. The SVC stent covers the inflow from the left innominate vein. Additionally, tapered narrowing of the central left innominate vein just proximal to its confluence with the SVC suggests involvement by the patient's known recurrent tumor. Although this region responded to 10 mm  balloon angioplasty, she remains at risk for recurrence of thrombosis and should probably REMAIN ON LIFELONG ANTICOAGULATION if tolerated. 4. Mild proximal SVC stent stenosis, with good response to 10 mm balloon angioplasty.   Electronically Signed   By: Lucrezia Europe M.D.   On: 12/01/2014 12:54   Dg Chest Port 1 View  11/30/2014   CLINICAL DATA:  Lung cancer with bilateral upper extremity edema  EXAM: PORTABLE CHEST - 1 VIEW  COMPARISON:  Chest radiograph October 09, 2014 and chest CT November 03, 2014  FINDINGS: The previously noted lung carcinoma in the right upper lobe is again present measuring approximately 4.2 x 3.4 cm. Is tumor appears to extend into the right hilum, unchanged. Lungs elsewhere clear. Heart size and pulmonary vascularity are normal. No adenopathy. Port-A-Cath tip is in the superior vena cava. No pneumothorax. There is a stent in the superior vena cava region, stable.  IMPRESSION: Persistent right upper lobe mass extending in the right hilum. Stent in superior vena cava. No new opacity. No change in cardiac silhouette.   Electronically Signed   By: Lowella Grip III M.D.   On: 11/30/2014 15:49     CBC  Recent Labs Lab 11/28/14 1320 11/30/14 1450 12/01/14 0428 12/02/14 0447 12/03/14 0042 12/03/14 0754  WBC 5.4 4.0 4.4 5.2 5.4  --   HGB 11.1* 10.3* 10.2* 9.5* 8.6* 8.9*  HCT 34.6* 32.7* 32.0* 29.5* 26.5* 27.7*  PLT 207 226 209 181 181  --   MCV 83.6 83.8 84.2 81.7 82.6  --   MCH 26.8 26.4 26.8  26.3 26.8  --   MCHC 32.1 31.5 31.9 32.2 32.5  --   RDW 16.4* 16.7* 16.7* 16.5* 16.4*  --   LYMPHSABS 1.1  --   --   --   --   --   MONOABS 0.9  --   --   --   --   --   EOSABS 0.1  --   --   --   --   --   BASOSABS 0.1  --   --   --   --   --     Chemistries   Recent Labs Lab 11/28/14 1320 11/30/14 1450 12/02/14 0447  NA 140 137 135  K 3.8 4.0 3.7  CL  --  102 97  CO2 22 26 30   GLUCOSE 219* 178* 173*  BUN 5.6* 7 <5*  CREATININE 0.8 0.59 0.59  CALCIUM 8.3* 8.1*  7.9*  AST 20  --   --   ALT 18  --   --   ALKPHOS 80  --   --   BILITOT 0.23  --   --    ------------------------------------------------------------------------------------------------------------------ estimated creatinine clearance is 81.2 mL/min (by C-G formula based on Cr of 0.59). ------------------------------------------------------------------------------------------------------------------ No results for input(s): HGBA1C in the last 72 hours. ------------------------------------------------------------------------------------------------------------------ No results for input(s): CHOL, HDL, LDLCALC, TRIG, CHOLHDL, LDLDIRECT in the last 72 hours. ------------------------------------------------------------------------------------------------------------------ No results for input(s): TSH, T4TOTAL, T3FREE, THYROIDAB in the last 72 hours.  Invalid input(s): FREET3 ------------------------------------------------------------------------------------------------------------------ No results for input(s): VITAMINB12, FOLATE, FERRITIN, TIBC, IRON, RETICCTPCT in the last 72 hours.  Coagulation profile  Recent Labs Lab 11/30/14 1444  INR 1.14    No results for input(s): DDIMER in the last 72 hours.  Cardiac Enzymes No results for input(s): CKMB, TROPONINI, MYOGLOBIN in the last 168 hours.  Invalid input(s): CK ------------------------------------------------------------------------------------------------------------------ Invalid input(s): POCBNP     Time Spent in minutes  35   Garvin Ellena K M.D on 12/03/2014 at 10:50 AM  Between 7am to 7pm - Pager - [phone]  After 7pm go to [email or web address] - [facility] Office  [phone]

## 2014-12-03 NOTE — Progress Notes (Signed)
SATURATION QUALIFICATIONS: (This note is used to comply with regulatory documentation for home oxygen)  Patient Saturations on Room Air at Rest 96%  Patient Saturations on Room Air while Ambulating = 86%  Patient Saturations on 2 Liters of oxygen while Ambulating = 94%  Please briefly explain why patient needs home oxygen:

## 2014-12-04 ENCOUNTER — Other Ambulatory Visit: Payer: Self-pay

## 2014-12-04 ENCOUNTER — Inpatient Hospital Stay (HOSPITAL_COMMUNITY): Payer: Medicare Other

## 2014-12-04 DIAGNOSIS — R0602 Shortness of breath: Secondary | ICD-10-CM

## 2014-12-04 LAB — CBC
HEMATOCRIT: 26 % — AB (ref 36.0–46.0)
Hemoglobin: 8.3 g/dL — ABNORMAL LOW (ref 12.0–15.0)
MCH: 26.1 pg (ref 26.0–34.0)
MCHC: 31.9 g/dL (ref 30.0–36.0)
MCV: 81.8 fL (ref 78.0–100.0)
Platelets: 169 10*3/uL (ref 150–400)
RBC: 3.18 MIL/uL — ABNORMAL LOW (ref 3.87–5.11)
RDW: 16.8 % — ABNORMAL HIGH (ref 11.5–15.5)
WBC: 4.5 10*3/uL (ref 4.0–10.5)

## 2014-12-04 LAB — GLUCOSE, CAPILLARY
GLUCOSE-CAPILLARY: 164 mg/dL — AB (ref 70–99)
Glucose-Capillary: 208 mg/dL — ABNORMAL HIGH (ref 70–99)
Glucose-Capillary: 167 mg/dL — ABNORMAL HIGH (ref 70–99)
Glucose-Capillary: 290 mg/dL — ABNORMAL HIGH (ref 70–99)

## 2014-12-04 MED ORDER — VANCOMYCIN HCL IN DEXTROSE 1-5 GM/200ML-% IV SOLN
1000.0000 mg | Freq: Two times a day (BID) | INTRAVENOUS | Status: DC
  Administered 2014-12-04 – 2014-12-06 (×5): 1000 mg via INTRAVENOUS
  Filled 2014-12-04 (×7): qty 200

## 2014-12-04 NOTE — Progress Notes (Signed)
Attempted to call us regarding soft tissue US.  No answer yet.  Will continue to monitor. Janice Ball

## 2014-12-04 NOTE — Progress Notes (Signed)
IR called about redness and swelling at left arm TPA administration site.  Spoke with Nira Conn who stated she will send PA up to look at site.  Will continue to monitor. Graceann Congress

## 2014-12-04 NOTE — Progress Notes (Signed)
Referring Physician(s): Dr. Candiss Norse  Subjective: Follow up eval s/p (L)UE thrombectomy and focal thrombolysis with balloon angioplasty of (L)innominate vein with good result. Pt has remained on therapeutic anticoagulation since procedure. Has had gradual progression of erythema from (L)brachial vein access site. Pt reports overall, arm feels better, though still edematous. Denies much discomfort  Allergies: Sulfonamide derivatives; Codeine; Dilaudid; Penicillins; and Vicodin  Medications: Prior to Admission medications   Medication Sig Start Date End Date Taking? Authorizing Provider  acetaminophen (TYLENOL) 325 MG tablet Take 650 mg by mouth every 6 (six) hours as needed for mild pain or headache.    Yes Historical Provider, MD  albuterol (PROVENTIL HFA;VENTOLIN HFA) 108 (90 BASE) MCG/ACT inhaler Inhale 1 puff into the lungs every 6 (six) hours as needed for wheezing or shortness of breath.   Yes Historical Provider, MD  alendronate (FOSAMAX) 10 MG tablet Take 10 mg by mouth daily.  03/20/14  Yes Historical Provider, MD  aspirin 325 MG tablet Take 325 mg by mouth daily with breakfast.    Yes Historical Provider, MD  budesonide-formoterol (SYMBICORT) 160-4.5 MCG/ACT inhaler Inhale 2 puffs into the lungs 2 (two) times daily.   Yes Historical Provider, MD  cholecalciferol (VITAMIN D) 1000 UNITS tablet Take 1,000 Units by mouth every morning.    Yes Historical Provider, MD  cyanocobalamin 1000 MCG tablet Take 1,000 mcg by mouth every morning.    Yes Historical Provider, MD  glimepiride (AMARYL) 2 MG tablet Take 2 mg by mouth daily with breakfast.  12/21/13  Yes Historical Provider, MD  guaiFENesin (MUCINEX) 600 MG 12 hr tablet Take 1,200 mg by mouth 2 (two) times daily.   Yes Historical Provider, MD  ipratropium (ATROVENT) 0.02 % nebulizer solution Take 2.5 mLs (0.5 mg total) by nebulization 4 (four) times daily. 03/05/14  Yes Brand Males, MD  levothyroxine (SYNTHROID, LEVOTHROID) 125 MCG  tablet Take 125 mcg by mouth daily before breakfast.    Yes Historical Provider, MD  lidocaine-prilocaine (EMLA) cream Apply 1 application topically as needed. Apply to port 1 hr before chemo 11/06/14  Yes Curt Bears, MD  LORazepam (ATIVAN) 1 MG tablet Take 1 mg by mouth 3 (three) times daily as needed for anxiety.    Yes Historical Provider, MD  magnesium gluconate (MAGONATE) 500 MG tablet Take 500 mg by mouth every morning.    Yes Historical Provider, MD  metFORMIN (GLUCOPHAGE) 1000 MG tablet Take 1,000 mg by mouth 2 (two) times daily with a meal.   Yes Historical Provider, MD  Multiple Vitamin (MULTIVITAMIN WITH MINERALS) TABS tablet Take 1 tablet by mouth every morning.   Yes Historical Provider, MD  OVER THE COUNTER MEDICATION Take 1 capsule by mouth daily. MSM. For energy and joint pain   Yes Historical Provider, MD  oxyCODONE (OXY IR/ROXICODONE) 5 MG immediate release tablet Take 1 tablet (5 mg total) by mouth every 4 (four) hours as needed for moderate pain. 11/28/14  Yes Adrena E Johnson, PA-C  PROAIR HFA 108 (90 BASE) MCG/ACT inhaler INHALE 2 PUFFS INTO THE LUNGS EVERY 6 (SIX) HOURS AS NEEDED FOR WHEEZING OR SHORTNESS OF BREATH. 10/08/14  Yes Brand Males, MD  prochlorperazine (COMPAZINE) 10 MG tablet Take 1 tablet (10 mg total) by mouth every 6 (six) hours as needed for nausea or vomiting. 11/28/14  Yes Adrena E Johnson, PA-C  sertraline (ZOLOFT) 100 MG tablet Take 150 mg by mouth at bedtime.    Yes Historical Provider, MD  spironolactone (ALDACTONE) 25 MG tablet Take  25 mg by mouth every morning.    Yes Historical Provider, MD  docusate sodium 100 MG CAPS Take 100 mg by mouth 2 (two) times daily. Patient not taking: Reported on 11/28/2014 11/05/14   Reyne Dumas, MD  enoxaparin (LOVENOX) 120 MG/0.8ML injection Inject 0.8 mLs (120 mg total) into the skin daily. 12/03/14   Thurnell Lose, MD     Vital Signs: BP 125/68 mmHg  Pulse 109  Temp(Src) 98.1 F (36.7 C) (Oral)  Resp 18   Ht 5\' 7"  (1.702 m)  Wt 175 lb 14.4 oz (79.788 kg)  BMI 27.54 kg/m2  SpO2 98%  Physical Exam (R)UE looks good, no edema (L)UE with 1-2+ edema from mid-humerus down to hand. Focal erythema above and below antecubital fossa, non-tender. +warmth. Puncture site with small dry scab, no fluctuance or purulence. Hand warm, sensation intact, excellent radial pulse  Imaging:  Ir Veno/ext/uni Left  12/01/2014   INDICATION: Right SVC clot and left arm brachial vein clot. 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 12 mm SVC stent placed 10/17/2014.  EXAM: THROMBOECTOMY MECHANICAL VENOUS; PTA VENOUS; LEFT EXTREMITY VENOGRAPHY; IR ULTRASOUND GUIDANCE VASC ACCESS RIGHT; IR ULTRASOUND GUIDANCE VASC ACCESS LEFT; SUPERIOR VENA CAVOGRAM  COMPARISON:  CT 11/30/2014 and earlier studies  MEDICATIONS: Intravenous Fentanyl and Versed were administered as conscious sedation during continuous cardiorespiratory monitoring by the radiology RN, with a total moderate sedation time of 60 minutes.  CONTRAST:  12mL OMNIPAQUE IOHEXOL 300 MG/ML  SOLN  FLUOROSCOPY TIME:  5 minutes.  42 seconds.  COMPLICATIONS: None immediate  TECHNIQUE: Informed written consent was obtained from the patient after a discussion of the risks, benefits and alternatives to treatment. Questions regarding the procedure were encouraged and answered. A timeout was performed prior to the initiation of the procedure.  Survey ultrasound of the left upper extremity was performed. The brachial DVT was localized and a more peripheral patent segment of the brachial vein was identified for access. Overlying skin site was marked.  The site was prepped and draped in the usual sterile fashion, and a sterile drape was applied covering the operative field. Maximum barrier sterile technique with sterile gowns and gloves were used for the procedure. A timeout was performed prior to the initiation of the procedure. Local anesthesia  was provided with 1% lidocaine.  Under real-time ultrasound guidance, access to the left brachial vein was achieved with a micropuncture set. Left upper extremity venography was performed. The dilator was exchanged over a Bentson for a 6 [place] vascular sheath, through which a 5 [place] Kumpe catheter was advanced to the SVC. This was exchanged for a Angiojet catheter, which allowed pulse spray delivery of 10 mg tPA in100 mL sterile saline through the left upper extremity, subclavian and innominate vein clot. The Angiojet catheter would not advance across the stent into the SVC, so right IJ access was achieved. As before, the region was prepped and draped in usual sterile fashion and ultrasound-guided micropuncture access to the right IJ was achieved. Venography was performed. After the TPA had been allowed least 20 minutes post pulse spray, the Angiojet was again utilized in thrombectomy mode to extract the thrombus from the left upper extremity, subclavian and innominate veins. Residual occlusive thrombus in the central left subclavian and innominate vein was treated with additional passes of the Angiojet thrombectomy catheter. This was exchanged for a 10 mm Mustang balloon, advanced to the confluence of the left innominate vein and SVC  for dilatation of a persistent stenosis. In similar fashion, the 10 mm balloon was advanced via the right IJ access for PTA of a proximal SVC stenosis. Final venography at both sites was performed. The catheters, guidewires and sheaths were removed and hemostasis achieved at the site with manual compression. The patient tolerated the procedure well.  FINDINGS: Left upper extremity venography demonstrated occlusive clot in the central aspect of the left brachial vein extending through the axillary vein, left subclavian vein and left innominate vein. There is tapered narrowing of the innominate vein near its confluence with the SVC. There was non opacification of the SVC. The left  upper extremity, subclavian, and innominate thrombus was treated with mechanical thrombectomy after pulse spray of tPA as detailed above. No significant residual clot on final venography.  Right IJ venography demonstrated patency of the SVC. The stent extends from the distal right innominate vein to the SVC, covering the inflow of the left innominate vein. No collateral filling was identified. There is mild narrowing of the stent at the confluence of the innominate veins.  The central left innominate vein stenosis responded well to 10 mm balloon angioplasty. Follow-up venography showed significant improvement in diameter of the vein at its confluence with the SVC at the level of the sidewall stent. The proximal SVC stenosis responded readily to 10 mm balloon angioplasty and follow-up venography showed no extravasation, residual or recurrent stenosis.  IMPRESSION: 1. Extensive occlusive left upper extremity, subclavian, and innominate vein DVT, with successful TPA assisted mechanical thrombectomy. 2. The SVC stent remains patent. Non opacification on prior CT probably secondary to injection through the port catheter. 3. The SVC stent covers the inflow from the left innominate vein. Additionally, tapered narrowing of the central left innominate vein just proximal to its confluence with the SVC suggests involvement by the patient's known recurrent tumor. Although this region responded to 10 mm balloon angioplasty, she remains at risk for recurrence of thrombosis and should probably REMAIN ON LIFELONG ANTICOAGULATION if tolerated. 4. Mild proximal SVC stent stenosis, with good response to 10 mm balloon angioplasty.   Electronically Signed   By: Lucrezia Europe M.D.   On: 12/01/2014 12:54     Dg Chest Port 1 View  11/30/2014   CLINICAL DATA:  Lung cancer with bilateral upper extremity edema  EXAM: PORTABLE CHEST - 1 VIEW  COMPARISON:  Chest radiograph October 09, 2014 and chest CT November 03, 2014  FINDINGS: The  previously noted lung carcinoma in the right upper lobe is again present measuring approximately 4.2 x 3.4 cm. Is tumor appears to extend into the right hilum, unchanged. Lungs elsewhere clear. Heart size and pulmonary vascularity are normal. No adenopathy. Port-A-Cath tip is in the superior vena cava. No pneumothorax. There is a stent in the superior vena cava region, stable.  IMPRESSION: Persistent right upper lobe mass extending in the right hilum. Stent in superior vena cava. No new opacity. No change in cardiac silhouette.   Electronically Signed   By: Lowella Grip III M.D.   On: 11/30/2014 15:49    Labs:  CBC:  Recent Labs  12/01/14 0428 12/02/14 0447 12/03/14 0042 12/03/14 0754 12/04/14 0430  WBC 4.4 5.2 5.4  --  4.5  HGB 10.2* 9.5* 8.6* 8.9* 8.3*  HCT 32.0* 29.5* 26.5* 27.7* 26.0*  PLT 209 181 181  --  169    COAGS:  Recent Labs  10/17/14 1110 11/30/14 1444  INR 1.11 1.14  APTT 29 30    BMP:  Recent Labs  11/03/14 1222 11/04/14 0435  11/21/14 1245 11/28/14 1320 11/30/14 1450 12/02/14 0447  NA 135 135  < > 138 140 137 135  K 3.8 3.8  < > 3.9 3.8 4.0 3.7  CL 99 100  --   --   --  102 97  CO2 22 27  < > 24 22 26 30   GLUCOSE 188* 173*  < > 309* 219* 178* 173*  BUN 8 8  < > 8.1 5.6* 7 <5*  CALCIUM 8.3* 8.2*  < > 8.1* 8.3* 8.1* 7.9*  CREATININE 0.71 0.61  < > 0.8 0.8 0.59 0.59  GFRNONAA >90 >90  --   --   --  >90 >90  GFRAA >90 >90  --   --   --  >90 >90  < > = values in this interval not displayed.  LIVER FUNCTION TESTS:  Recent Labs  11/07/14 1205 11/14/14 1354 11/21/14 1245 11/28/14 1320  BILITOT 0.27 0.33 0.25 0.23  AST 24 23 23 20   ALT 13 21 25 18   ALKPHOS 77 75 73 80  PROT 7.3 6.8 6.7 6.9  ALBUMIN 3.4* 3.3* 3.3* 3.6    Assessment and Plan: (L)UE DVT s/p successful TPA assisted mechanical thrombectomy and balloon angioplasty of left innominate  Post procedural erythema and edema of left arm. Could just be inflammatory changes related  to infiltration during procedure. Doesn't look like cellulitis(non-tender, afebrile, nl WBC) but could cover empirically. Will get repeat (L)UE venous duplex to rule out recurrent brachial DVT as well. Needs to keep arm elevated at all times, cool compresses may be helpful as well. IR following.  SignedAscencion Dike 12/04/2014, 9:46 AM   I spent a total of 20 Minutes in face to face in clinical consultation/evaluation, greater than 50% of which was counseling/coordinating care for (L)UE DVT.

## 2014-12-04 NOTE — Progress Notes (Signed)
ANTIBIOTIC _ [facility] for Vancomycin and Lovenox Indication: cellultis and right SVC clot and left arm brachial vein clot   Allergies  Allergen Reactions  . Sulfonamide Derivatives Rash  . Codeine Other (See Comments)    hallucinations  . Dilaudid [Hydromorphone Hcl] Nausea And Vomiting and Other (See Comments)    Room started spinning.  Pt has been okay with low doses and nausea meds administered first  . Penicillins Other (See Comments)    Childhood allergy; reaction unknown  . Vicodin [Hydrocodone-Acetaminophen] Rash    Patient Measurements: Height: 5\' 7"  (170.2 cm) Weight: 175 lb 14.4 oz (79.788 kg) (Scale A) IBW/kg (Calculated) : 61.6  Vital Signs: Temp: 98.1 F (36.7 C) (02/17 0559) Temp Source: Oral (02/17 0559) BP: 125/68 mmHg (02/17 0748) Pulse Rate: 109 (02/17 0748) Intake/Output from previous day: 02/16 0701 - 02/17 0700 In: 1420 [P.O.:1420] Out: 1825 [Urine:1825] Intake/Output from this shift: Total I/O In: 360 [P.O.:360] Out: 300 [Urine:300]  Labs:  Recent Labs  12/02/14 0447 12/03/14 0042 12/03/14 0754 12/04/14 0430  WBC 5.2 5.4  --  4.5  HGB 9.5* 8.6* 8.9* 8.3*  PLT 181 181  --  169  CREATININE 0.59  --   --   --    Estimated Creatinine Clearance: 81.3 mL/min (by C-G formula based on Cr of 0.59).  Assessment:   To begin Vancomycin for left arm swelling with redness. Cellulitis vs local reaction to TPA from 12/01/14 procedure.  Afebrile, WBC 4.5.  Ultrasound and dopplers pending.   Transitioned from IV heparin to Lovenox 120 mg sq q24hrs yesterday. CBC has trended down a bit more. No bleeding or hematuria noted.  Goal of Therapy:  Vancomycin trough level 10-15 mcg/ml  Anti-Xa level 0.6-1 units/ml 4hrs after LMWH dose given Monitor platelets by anticoagula tion protocol: Yes  Plan:   Vancomycin 1 gram IV q12hrs.  Will follow renal function, ultrasound and dopplers, progress.  Continue on Lovenox 120 mg sq  q24hrs.  CBC in am.  Arty Baumgartner, [place] Pager: [phone] 12/04/2014,10:38 AM

## 2014-12-04 NOTE — Progress Notes (Addendum)
Patient Demographics  Janice Ball, is a 59 y.o. female, DOB - [birth year], MBT:[id number]  Admit date - 11/30/2014   Admitting Physician Debbe Odea, MD  Outpatient Primary MD for the patient is [name], NP  LOS - 4   Chief Complaint  Patient presents with  . Arm Swelling  . Shortness of Breath  . chemo         Subjective:   Janice Ball today has, No headache, No chest pain, No abdominal pain - No Nausea, No new weakness tingling or numbness, No Cough - SOB.    Assessment & Plan    1. Right SVC Stent with suspected clot and new left arm brachial vein clot. 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 Status post venogram and TPA by IR, venogram revealed patent SVC tent on the right side and extensive left upper extremity clot burden requiring TPA, she developed some hematuria 12-01-14 after her TPA and Lovenox.   Hematuria has resolved, discussed her case with her oncologist Dr. Julien Ball on 12/03/2014 with switch her to Lovenox if no further hematuria discharged on Lovenox. Discussed over the phone with Dr. Bridgett Ball vascular surgeon on 12/01/2014.    2. Right upper lobe non-small cell lung cancer. Undergoing repeat chemotherapy under Dr. Julien Ball. Outpatient follow-up post discharge.   3. Baseline sinus tachycardia. Heart rate between 110 and 120 at baseline reviewed Dr. Worthy Flank office note. Stable.   4. Hypothyroidism. Continue Synthroid. Request PCP to check TSH 1 time on neck visit.   5. Diabetes mellitus type 2. Currently on sliding scale. We will give her home dose Amaryl once taking by mouth.  CBG (last 3)   Recent Labs  12/03/14 1633 12/03/14 2107 12/04/14 0602  GLUCAP 142* 184* 164*    6. Left arm swelling with redness on  12/04/2014. Suspicious for cellulitis versus local reaction to TPA. Place on vancomycin, obtain soft tissue ultrasound of the left arm to rule out any fluid collection underneath. Monitor CBC and monitor clinically. Currently afebrile.      Code Status: Full  Family Communication: None  Disposition Plan: Home   Procedures CT angiogram chest, venous duplex left arm,  IR TPA-  IMPRESSION: 1. Extensive occlusive left upper extremity, subclavian, and innominate vein DVT, with successful TPA assisted mechanical thrombectomy. 2. The SVC stent remains patent. Non opacification on prior CT probably secondary to injection through the port catheter. 3. The SVC stent covers the inflow from the left innominate vein. Additionally, tapered narrowing of the central left innominate vein just proximal to its confluence with the SVC suggests involvement by the patient's known recurrent tumor. Although this region responded to 10 mm balloon angioplasty, she remains at risk for recurrence of thrombosis and should probably REMAIN ON LIFELONG ANTICOAGULATION if tolerated. 4. Mild proximal SVC stent stenosis, with good response to 10 mm balloon angioplasty.    Consults  IR, Vas Surg Dr Janice Ball over the phone     Medications  Scheduled Meds: . antiseptic oral rinse  7 mL Mouth Rinse BID  . budesonide-formoterol  2 puff Inhalation BID  . enoxaparin (LOVENOX) injection  120 mg Subcutaneous Q24H  . glimepiride  2 mg Oral Q breakfast  .  guaiFENesin  1,200 mg Oral BID  . insulin aspart  0-5 Units Subcutaneous QHS  . insulin aspart  0-9 Units Subcutaneous TID WC  . levothyroxine  125 mcg Oral QAC breakfast  . sertraline  150 mg Oral QHS  . sodium chloride  10-40 mL Intracatheter Q12H  . sodium chloride  3 mL Intravenous Q12H  . spironolactone  25 mg Oral Daily   Continuous Infusions:   PRN Meds:.sodium chloride, acetaminophen, alum & mag hydroxide-simeth, guaiFENesin-dextromethorphan,  HYDROcodone-acetaminophen, levalbuterol, LORazepam, morphine injection, ondansetron **OR** ondansetron (ZOFRAN) IV, oxyCODONE, prochlorperazine, sodium chloride, sodium chloride  DVT Prophylaxis    Heparin  gtt  Lab Results  Component Value Date   PLT 169 12/04/2014    Antibiotics     Anti-infectives    None          Objective:   Filed Vitals:   12/03/14 2100 12/04/14 0128 12/04/14 0559 12/04/14 0748  BP: 118/60 127/67 128/72 125/68  Pulse: 118 135 113 109  Temp: 98 F (36.7 C)  98.1 F (36.7 C)   TempSrc: Oral  Oral   Resp: 20  18   Height:      Weight:   79.788 kg (175 lb 14.4 oz)   SpO2: 98% 96% 99% 98%    Wt Readings from Last 3 Encounters:  12/04/14 79.788 kg (175 lb 14.4 oz)  11/28/14 79.606 kg (175 lb 8 oz)  11/06/14 80.74 kg (178 lb)     Intake/Output Summary (Last 24 hours) at 12/04/14 1016 Last data filed at 12/04/14 0944  Gross per 24 hour  Intake   1420 ml  Output   1975 ml  Net   -555 ml     Physical Exam  Awake Alert, Oriented X 3, No new F.N deficits, Normal affect [place].AT,PERRAL Supple Neck,No JVD, No cervical lymphadenopathy appriciated.  Symmetrical Chest wall movement, Good air movement bilaterally, CTAB RRR,No Gallops,Rubs or new Murmurs, No Parasternal Heave +ve B.Sounds, Abd Soft, No tenderness, No organomegaly appriciated, No rebound - guarding or rigidity. No Cyanosis, Clubbing or edema, No new Rash or bruise  , L arm swollen today it's red at the site of TPA infusion   Data Review   Micro Results Recent Results (from the past 240 hour(s))  MRSA PCR Screening     Status: None   Collection Time: 11/30/14  8:52 PM  Result Value Ref Range Status   MRSA by PCR NEGATIVE NEGATIVE Final    Comment:        The GeneXpert MRSA Assay (FDA approved for NASAL specimens only), is one component of a comprehensive MRSA colonization surveillance program. It is not intended to diagnose MRSA infection nor to guide or monitor treatment  for MRSA infections.     Radiology Reports Ct Angio Chest Pe W/cm &/or Wo Cm  11/30/2014   ADDENDUM REPORT: 11/30/2014 16:59  ADDENDUM: Critical Value/emergent results were called by telephone at the time of interpretation on 11/30/2014 at 4:56 pm to Dr. Noemi Chapel , who verbally acknowledged these results.   Electronically Signed   By: Genevie Ann M.D.   On: 11/30/2014 16:59   11/30/2014   CLINICAL DATA:  60 year old female with shortness of breath. Positive left upper extremity DVT. History of lung cancer and COPD.   Initial encounter.  EXAM: CT ANGIOGRAPHY CHEST WITH CONTRAST  TECHNIQUE: Multidetector CT imaging of the chest was performed using the standard protocol during bolus administration of intravenous contrast. Multiplanar CT image reconstructions and MIPs were obtained to  evaluate the vascular anatomy.  CONTRAST:  16mL OMNIPAQUE IOHEXOL 350 MG/ML SOLN  COMPARISON:  Chest CTA 10/09/2014.  Common chest CTA 11/03/2014.  FINDINGS: Good contrast bolus timing in the pulmonary arterial tree. No pulmonary artery filling defect consistent with pulmonary embolus.  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 The right chest porta cath was injected common the catheter passed through a vascular stent of the SVC, which does also thrombosis (series 6, image 104). Some of the right upper lobe pulmonary arteries are significantly narrowed by tumor (image when 18).  At the same time mediastinal tumor bulk has slightly regressed since 10/09/2014 (such as on series 4, image 35 measuring 48 x 43 mm canal versus 57 x 59 mm at a comparable level previously).  No pericardial or pleural effusion. Narrowing of the carina and central right lung airways has not significantly changed. Spiculated peribronchovascular opacity in the right upper lobe is stable to slightly regressed. No areas of worsening ventilation.  Stable visualized aorta  and great vessels with atherosclerosis. Stable visualized upper abdominal viscera. No acute osseous abnormality identified.  Review of the MIP images confirms the above findings.  IMPRESSION: 1. 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 2. Tumor bulky is stable to slightly regressed since December. 3. No new chest abnormality identified.  Electronically Signed: By: Genevie Ann M.D. On: 11/30/2014 16:53   Ct Angio Chest Pe W/cm &/or Wo Cm  11/03/2014   CLINICAL DATA:  Increased chest pain and SOB s/p recent stent placement. 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 History of COPD and lung cancer (receives chemo).Nurse note: Pt reports chest pain and sob intermittent over last few days. But consistent since last night. Sob worse with exertion. Had ?pulmonary artery stent placed 2 weeks ago. Sts "I'm not sure if the stent is even working, it feels the same as it did before the stent. 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 Denies n/v, dizziness.  EXAM: CT ANGIOGRAPHY CHEST WITH CONTRAST  TECHNIQUE: Multidetector CT imaging of the chest was performed using the standard protocol during bolus administration of intravenous contrast. Multiplanar CT image reconstructions and MIPs were obtained to evaluate the vascular anatomy.  CONTRAST:  100 cc Omnipaque 350  COMPARISON:  Chest CT dated 09/02/2014.  FINDINGS: The previously demonstrated right hilar mass is larger. The posterior extension previously measured 4.7 x 2.6 cm and currently measures 6.4 x 3.3 cm with increased vascular and bronchial encasement. The previously demonstrated 5.0 x 2.8 cm precarinal mediastinal component currently measures 5.5 x 2.3 cm. By my measurements, this previously measure 5.0 x 2.1 cm and corresponding dimensions.  A previously demonstrated 6 mm short axis right  hilar lymph node is increased in size, currently A 10.5 mm short axis on image number 43. No enlarged lymph nodes elsewhere in the chest or upper abdomen.  There is a minimal pericardial effusion with a maximum thickness of 6 mm. There are multiple small, sub cm nodules in both lungs. Some of these are new and some are enlarging. An old, healed right anterolateral rib fracture is again demonstrated. No bone metastases are visualized.  The pulmonary arteries are normally opacified with no pulmonary arterial filling defects seen. Moderate thoracic scoliosis is noted.  Review of the MIP images confirms the above  findings.  IMPRESSION: 1.  No pulmonary emboli.  2. Enlarging right hilar and mediastinal lung cancer with increased vascular and bronchial encasement.  3. Multiple sub cm new and enlarging nodules in both lungs. These are suspicious for early metastases.  4.  Progressive right hilar metastatic adenopathy.   Electronically Signed   By: Enrique Sack M.D.   On: 11/03/2014 14:10   Ir Veno/ext/uni Left  12/01/2014   INDICATION: Right SVC clot and left arm brachial vein clot. 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 12 mm SVC stent placed 10/17/2014.  EXAM: THROMBOECTOMY MECHANICAL VENOUS; PTA VENOUS; LEFT EXTREMITY VENOGRAPHY; IR ULTRASOUND GUIDANCE VASC ACCESS RIGHT; IR ULTRASOUND GUIDANCE VASC ACCESS LEFT; SUPERIOR VENA CAVOGRAM  COMPARISON:  CT 11/30/2014 and earlier studies  MEDICATIONS: Intravenous Fentanyl and Versed were administered as conscious sedation during continuous cardiorespiratory monitoring by the radiology RN, with a total moderate sedation time of 60 minutes.  CONTRAST:  34mL OMNIPAQUE IOHEXOL 300 MG/ML  SOLN  FLUOROSCOPY TIME:  5 minutes.  42 seconds.  COMPLICATIONS: None immediate  TECHNIQUE: Informed written consent was obtained from the patient after a discussion of the risks, benefits and alternatives to treatment. Questions regarding the  procedure were encouraged and answered. A timeout was performed prior to the initiation of the procedure.  Survey ultrasound of the left upper extremity was performed. The brachial DVT was localized and a more peripheral patent segment of the brachial vein was identified for access. Overlying skin site was marked.  The site was prepped and draped in the usual sterile fashion, and a sterile drape was applied covering the operative field. Maximum barrier sterile technique with sterile gowns and gloves were used for the procedure. A timeout was performed prior to the initiation of the procedure. Local anesthesia was provided with 1% lidocaine.  Under real-time ultrasound guidance, access to the left brachial vein was achieved with a micropuncture set. Left upper extremity venography was performed. The dilator was exchanged over a Bentson for a 6 [place] vascular sheath, through which a 5 [place] Kumpe catheter was advanced to the SVC. This was exchanged for a Angiojet catheter, which allowed pulse spray delivery of 10 mg tPA in100 mL sterile saline through the left upper extremity, subclavian and innominate vein clot. The Angiojet catheter would not advance across the stent into the SVC, so right IJ access was achieved. As before, the region was prepped and draped in usual sterile fashion and ultrasound-guided micropuncture access to the right IJ was achieved. Venography was performed. After the TPA had been allowed least 20 minutes post pulse spray, the Angiojet was again utilized in thrombectomy mode to extract the thrombus from the left upper extremity, subclavian and innominate veins. Residual occlusive thrombus in the central left subclavian and innominate vein was treated with additional passes of the Angiojet thrombectomy catheter. This was exchanged for a 10 mm Mustang balloon, advanced to the confluence of the left innominate vein and SVC for dilatation of a persistent stenosis. In similar fashion, the 10 mm  balloon was advanced via the right IJ access for PTA of a proximal SVC stenosis. Final venography at both sites was performed. The catheters, guidewires and sheaths were removed and hemostasis achieved at the site with manual compression. The patient tolerated the procedure well.  FINDINGS: Left upper extremity venography demonstrated occlusive clot in the central aspect of the left brachial vein extending through the axillary vein, left subclavian vein and left innominate  vein. There is tapered narrowing of the innominate vein near its confluence with the SVC. There was non opacification of the SVC. The left upper extremity, subclavian, and innominate thrombus was treated with mechanical thrombectomy after pulse spray of tPA as detailed above. No significant residual clot on final venography.  Right IJ venography demonstrated patency of the SVC. The stent extends from the distal right innominate vein to the SVC, covering the inflow of the left innominate vein. No collateral filling was identified. There is mild narrowing of the stent at the confluence of the innominate veins.  The central left innominate vein stenosis responded well to 10 mm balloon angioplasty. Follow-up venography showed significant improvement in diameter of the vein at its confluence with the SVC at the level of the sidewall stent. The proximal SVC stenosis responded readily to 10 mm balloon angioplasty and follow-up venography showed no extravasation, residual or recurrent stenosis.  IMPRESSION: 1. Extensive occlusive left upper extremity, subclavian, and innominate vein DVT, with successful TPA assisted mechanical thrombectomy. 2. The SVC stent remains patent. Non opacification on prior CT probably secondary to injection through the port catheter. 3. The SVC stent covers the inflow from the left innominate vein. Additionally, tapered narrowing of the central left innominate vein just proximal to its confluence with the SVC suggests  involvement by the patient's known recurrent tumor. Although this region responded to 10 mm balloon angioplasty, she remains at risk for recurrence of thrombosis and should probably REMAIN ON LIFELONG ANTICOAGULATION if tolerated. 4. Mild proximal SVC stent stenosis, with good response to 10 mm balloon angioplasty.   Electronically Signed   By: Lucrezia Europe M.D.   On: 12/01/2014 12:54   Ir Venocavagram Svc  12/01/2014   INDICATION: Right SVC clot and left arm brachial vein clot. 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 12 mm SVC stent placed 10/17/2014.  EXAM: THROMBOECTOMY MECHANICAL VENOUS; PTA VENOUS; LEFT EXTREMITY VENOGRAPHY; IR ULTRASOUND GUIDANCE VASC ACCESS RIGHT; IR ULTRASOUND GUIDANCE VASC ACCESS LEFT; SUPERIOR VENA CAVOGRAM  COMPARISON:  CT 11/30/2014 and earlier studies  MEDICATIONS: Intravenous Fentanyl and Versed were administered as conscious sedation during continuous cardiorespiratory monitoring by the radiology RN, with a total moderate sedation time of 60 minutes.  CONTRAST:  30mL OMNIPAQUE IOHEXOL 300 MG/ML  SOLN  FLUOROSCOPY TIME:  5 minutes.  42 seconds.  COMPLICATIONS: None immediate  TECHNIQUE: Informed written consent was obtained from the patient after a discussion of the risks, benefits and alternatives to treatment. Questions regarding the procedure were encouraged and answered. A timeout was performed prior to the initiation of the procedure.  Survey ultrasound of the left upper extremity was performed. The brachial DVT was localized and a more peripheral patent segment of the brachial vein was identified for access. Overlying skin site was marked.  The site was prepped and draped in the usual sterile fashion, and a sterile drape was applied covering the operative field. Maximum barrier sterile technique with sterile gowns and gloves were used for the procedure. A timeout was performed prior to the initiation of the procedure. Local  anesthesia was provided with 1% lidocaine.  Under real-time ultrasound guidance, access to the left brachial vein was achieved with a micropuncture set. Left upper extremity venography was performed. The dilator was exchanged over a Bentson for a 6 [place] vascular sheath, through which a 5 [place] Kumpe catheter was advanced to the SVC. This was exchanged for a Angiojet catheter, which allowed pulse  spray delivery of 10 mg tPA in100 mL sterile saline through the left upper extremity, subclavian and innominate vein clot. The Angiojet catheter would not advance across the stent into the SVC, so right IJ access was achieved. As before, the region was prepped and draped in usual sterile fashion and ultrasound-guided micropuncture access to the right IJ was achieved. Venography was performed. After the TPA had been allowed least 20 minutes post pulse spray, the Angiojet was again utilized in thrombectomy mode to extract the thrombus from the left upper extremity, subclavian and innominate veins. Residual occlusive thrombus in the central left subclavian and innominate vein was treated with additional passes of the Angiojet thrombectomy catheter. This was exchanged for a 10 mm Mustang balloon, advanced to the confluence of the left innominate vein and SVC for dilatation of a persistent stenosis. In similar fashion, the 10 mm balloon was advanced via the right IJ access for PTA of a proximal SVC stenosis. Final venography at both sites was performed. The catheters, guidewires and sheaths were removed and hemostasis achieved at the site with manual compression. The patient tolerated the procedure well.  FINDINGS: Left upper extremity venography demonstrated occlusive clot in the central aspect of the left brachial vein extending through the axillary vein, left subclavian vein and left innominate vein. There is tapered narrowing of the innominate vein near its confluence with the SVC. There was non opacification of the SVC.  The left upper extremity, subclavian, and innominate thrombus was treated with mechanical thrombectomy after pulse spray of tPA as detailed above. No significant residual clot on final venography.  Right IJ venography demonstrated patency of the SVC. The stent extends from the distal right innominate vein to the SVC, covering the inflow of the left innominate vein. No collateral filling was identified. There is mild narrowing of the stent at the confluence of the innominate veins.  The central left innominate vein stenosis responded well to 10 mm balloon angioplasty. Follow-up venography showed significant improvement in diameter of the vein at its confluence with the SVC at the level of the sidewall stent. The proximal SVC stenosis responded readily to 10 mm balloon angioplasty and follow-up venography showed no extravasation, residual or recurrent stenosis.  IMPRESSION: 1. Extensive occlusive left upper extremity, subclavian, and innominate vein DVT, with successful TPA assisted mechanical thrombectomy. 2. The SVC stent remains patent. Non opacification on prior CT probably secondary to injection through the port catheter. 3. The SVC stent covers the inflow from the left innominate vein. Additionally, tapered narrowing of the central left innominate vein just proximal to its confluence with the SVC suggests involvement by the patient's known recurrent tumor. Although this region responded to 10 mm balloon angioplasty, she remains at risk for recurrence of thrombosis and should probably REMAIN ON LIFELONG ANTICOAGULATION if tolerated. 4. Mild proximal SVC stent stenosis, with good response to 10 mm balloon angioplasty.   Electronically Signed   By: Lucrezia Europe M.D.   On: 12/01/2014 12:54   Ir Pta Venous Left  12/01/2014   INDICATION: Right SVC clot and left arm brachial vein clot. 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 12 mm SVC stent placed 10/17/2014.  EXAM:  THROMBOECTOMY MECHANICAL VENOUS; PTA VENOUS; LEFT EXTREMITY VENOGRAPHY; IR ULTRASOUND GUIDANCE VASC ACCESS RIGHT; IR ULTRASOUND GUIDANCE VASC ACCESS LEFT; SUPERIOR VENA CAVOGRAM  COMPARISON:  CT 11/30/2014 and earlier studies  MEDICATIONS: Intravenous Fentanyl and Versed were administered as conscious sedation during continuous cardiorespiratory  monitoring by the radiology RN, with a total moderate sedation time of 60 minutes.  CONTRAST:  8mL OMNIPAQUE IOHEXOL 300 MG/ML  SOLN  FLUOROSCOPY TIME:  5 minutes.  42 seconds.  COMPLICATIONS: None immediate  TECHNIQUE: Informed written consent was obtained from the patient after a discussion of the risks, benefits and alternatives to treatment. Questions regarding the procedure were encouraged and answered. A timeout was performed prior to the initiation of the procedure.  Survey ultrasound of the left upper extremity was performed. The brachial DVT was localized and a more peripheral patent segment of the brachial vein was identified for access. Overlying skin site was marked.  The site was prepped and draped in the usual sterile fashion, and a sterile drape was applied covering the operative field. Maximum barrier sterile technique with sterile gowns and gloves were used for the procedure. A timeout was performed prior to the initiation of the procedure. Local anesthesia was provided with 1% lidocaine.  Under real-time ultrasound guidance, access to the left brachial vein was achieved with a micropuncture set. Left upper extremity venography was performed. The dilator was exchanged over a Bentson for a 6 [place] vascular sheath, through which a 5 [place] Kumpe catheter was advanced to the SVC. This was exchanged for a Angiojet catheter, which allowed pulse spray delivery of 10 mg tPA in100 mL sterile saline through the left upper extremity, subclavian and innominate vein clot. The Angiojet catheter would not advance across the stent into the SVC, so right IJ access was  achieved. As before, the region was prepped and draped in usual sterile fashion and ultrasound-guided micropuncture access to the right IJ was achieved. Venography was performed. After the TPA had been allowed least 20 minutes post pulse spray, the Angiojet was again utilized in thrombectomy mode to extract the thrombus from the left upper extremity, subclavian and innominate veins. Residual occlusive thrombus in the central left subclavian and innominate vein was treated with additional passes of the Angiojet thrombectomy catheter. This was exchanged for a 10 mm Mustang balloon, advanced to the confluence of the left innominate vein and SVC for dilatation of a persistent stenosis. In similar fashion, the 10 mm balloon was advanced via the right IJ access for PTA of a proximal SVC stenosis. Final venography at both sites was performed. The catheters, guidewires and sheaths were removed and hemostasis achieved at the site with manual compression. The patient tolerated the procedure well.  FINDINGS: Left upper extremity venography demonstrated occlusive clot in the central aspect of the left brachial vein extending through the axillary vein, left subclavian vein and left innominate vein. There is tapered narrowing of the innominate vein near its confluence with the SVC. There was non opacification of the SVC. The left upper extremity, subclavian, and innominate thrombus was treated with mechanical thrombectomy after pulse spray of tPA as detailed above. No significant residual clot on final venography.  Right IJ venography demonstrated patency of the SVC. The stent extends from the distal right innominate vein to the SVC, covering the inflow of the left innominate vein. No collateral filling was identified. There is mild narrowing of the stent at the confluence of the innominate veins.  The central left innominate vein stenosis responded well to 10 mm balloon angioplasty. Follow-up venography showed significant  improvement in diameter of the vein at its confluence with the SVC at the level of the sidewall stent. The proximal SVC stenosis responded readily to 10 mm balloon angioplasty and follow-up venography showed no extravasation, residual  or recurrent stenosis.  IMPRESSION: 1. Extensive occlusive left upper extremity, subclavian, and innominate vein DVT, with successful TPA assisted mechanical thrombectomy. 2. The SVC stent remains patent. Non opacification on prior CT probably secondary to injection through the port catheter. 3. The SVC stent covers the inflow from the left innominate vein. Additionally, tapered narrowing of the central left innominate vein just proximal to its confluence with the SVC suggests involvement by the patient's known recurrent tumor. Although this region responded to 10 mm balloon angioplasty, she remains at risk for recurrence of thrombosis and should probably REMAIN ON LIFELONG ANTICOAGULATION if tolerated. 4. Mild proximal SVC stent stenosis, with good response to 10 mm balloon angioplasty.   Electronically Signed   By: Lucrezia Europe M.D.   On: 12/01/2014 12:54   Ir Pta Venous Right  12/01/2014   INDICATION: Right SVC clot and left arm brachial vein clot. 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 12 mm SVC stent placed 10/17/2014.  EXAM: THROMBOECTOMY MECHANICAL VENOUS; PTA VENOUS; LEFT EXTREMITY VENOGRAPHY; IR ULTRASOUND GUIDANCE VASC ACCESS RIGHT; IR ULTRASOUND GUIDANCE VASC ACCESS LEFT; SUPERIOR VENA CAVOGRAM  COMPARISON:  CT 11/30/2014 and earlier studies  MEDICATIONS: Intravenous Fentanyl and Versed were administered as conscious sedation during continuous cardiorespiratory monitoring by the radiology RN, with a total moderate sedation time of 60 minutes.  CONTRAST:  40mL OMNIPAQUE IOHEXOL 300 MG/ML  SOLN  FLUOROSCOPY TIME:  5 minutes.  42 seconds.  COMPLICATIONS: None immediate  TECHNIQUE: Informed written consent was obtained from  the patient after a discussion of the risks, benefits and alternatives to treatment. Questions regarding the procedure were encouraged and answered. A timeout was performed prior to the initiation of the procedure.  Survey ultrasound of the left upper extremity was performed. The brachial DVT was localized and a more peripheral patent segment of the brachial vein was identified for access. Overlying skin site was marked.  The site was prepped and draped in the usual sterile fashion, and a sterile drape was applied covering the operative field. Maximum barrier sterile technique with sterile gowns and gloves were used for the procedure. A timeout was performed prior to the initiation of the procedure. Local anesthesia was provided with 1% lidocaine.  Under real-time ultrasound guidance, access to the left brachial vein was achieved with a micropuncture set. Left upper extremity venography was performed. The dilator was exchanged over a Bentson for a 6 [place] vascular sheath, through which a 5 [place] Kumpe catheter was advanced to the SVC. This was exchanged for a Angiojet catheter, which allowed pulse spray delivery of 10 mg tPA in100 mL sterile saline through the left upper extremity, subclavian and innominate vein clot. The Angiojet catheter would not advance across the stent into the SVC, so right IJ access was achieved. As before, the region was prepped and draped in usual sterile fashion and ultrasound-guided micropuncture access to the right IJ was achieved. Venography was performed. After the TPA had been allowed least 20 minutes post pulse spray, the Angiojet was again utilized in thrombectomy mode to extract the thrombus from the left upper extremity, subclavian and innominate veins. Residual occlusive thrombus in the central left subclavian and innominate vein was treated with additional passes of the Angiojet thrombectomy catheter. This was exchanged for a 10 mm Mustang balloon, advanced to the confluence  of the left innominate vein and SVC for dilatation of a persistent stenosis. In similar fashion, the 10 mm balloon was advanced  via the right IJ access for PTA of a proximal SVC stenosis. Final venography at both sites was performed. The catheters, guidewires and sheaths were removed and hemostasis achieved at the site with manual compression. The patient tolerated the procedure well.  FINDINGS: Left upper extremity venography demonstrated occlusive clot in the central aspect of the left brachial vein extending through the axillary vein, left subclavian vein and left innominate vein. There is tapered narrowing of the innominate vein near its confluence with the SVC. There was non opacification of the SVC. The left upper extremity, subclavian, and innominate thrombus was treated with mechanical thrombectomy after pulse spray of tPA as detailed above. No significant residual clot on final venography.  Right IJ venography demonstrated patency of the SVC. The stent extends from the distal right innominate vein to the SVC, covering the inflow of the left innominate vein. No collateral filling was identified. There is mild narrowing of the stent at the confluence of the innominate veins.  The central left innominate vein stenosis responded well to 10 mm balloon angioplasty. Follow-up venography showed significant improvement in diameter of the vein at its confluence with the SVC at the level of the sidewall stent. The proximal SVC stenosis responded readily to 10 mm balloon angioplasty and follow-up venography showed no extravasation, residual or recurrent stenosis.  IMPRESSION: 1. Extensive occlusive left upper extremity, subclavian, and innominate vein DVT, with successful TPA assisted mechanical thrombectomy. 2. The SVC stent remains patent. Non opacification on prior CT probably secondary to injection through the port catheter. 3. The SVC stent covers the inflow from the left innominate vein. Additionally, tapered  narrowing of the central left innominate vein just proximal to its confluence with the SVC suggests involvement by the patient's known recurrent tumor. Although this region responded to 10 mm balloon angioplasty, she remains at risk for recurrence of thrombosis and should probably REMAIN ON LIFELONG ANTICOAGULATION if tolerated. 4. Mild proximal SVC stent stenosis, with good response to 10 mm balloon angioplasty.   Electronically Signed   By: Lucrezia Europe M.D.   On: 12/01/2014 12:54   Ir Thrombect Veno Mech Mod Sed  12/01/2014   INDICATION: Right SVC clot and left arm brachial vein clot. 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 12 mm SVC stent placed 10/17/2014.  EXAM: THROMBOECTOMY MECHANICAL VENOUS; PTA VENOUS; LEFT EXTREMITY VENOGRAPHY; IR ULTRASOUND GUIDANCE VASC ACCESS RIGHT; IR ULTRASOUND GUIDANCE VASC ACCESS LEFT; SUPERIOR VENA CAVOGRAM  COMPARISON:  CT 11/30/2014 and earlier studies  MEDICATIONS: Intravenous Fentanyl and Versed were administered as conscious sedation during continuous cardiorespiratory monitoring by the radiology RN, with a total moderate sedation time of 60 minutes.  CONTRAST:  87mL OMNIPAQUE IOHEXOL 300 MG/ML  SOLN  FLUOROSCOPY TIME:  5 minutes.  42 seconds.  COMPLICATIONS: None immediate  TECHNIQUE: Informed written consent was obtained from the patient after a discussion of the risks, benefits and alternatives to treatment. Questions regarding the procedure were encouraged and answered. A timeout was performed prior to the initiation of the procedure.  Survey ultrasound of the left upper extremity was performed. The brachial DVT was localized and a more peripheral patent segment of the brachial vein was identified for access. Overlying skin site was marked.  The site was prepped and draped in the usual sterile fashion, and a sterile drape was applied covering the operative field. Maximum barrier sterile technique with sterile gowns and  gloves were used for the procedure. A timeout was  performed prior to the initiation of the procedure. Local anesthesia was provided with 1% lidocaine.  Under real-time ultrasound guidance, access to the left brachial vein was achieved with a micropuncture set. Left upper extremity venography was performed. The dilator was exchanged over a Bentson for a 6 [place] vascular sheath, through which a 5 [place] Kumpe catheter was advanced to the SVC. This was exchanged for a Angiojet catheter, which allowed pulse spray delivery of 10 mg tPA in100 mL sterile saline through the left upper extremity, subclavian and innominate vein clot. The Angiojet catheter would not advance across the stent into the SVC, so right IJ access was achieved. As before, the region was prepped and draped in usual sterile fashion and ultrasound-guided micropuncture access to the right IJ was achieved. Venography was performed. After the TPA had been allowed least 20 minutes post pulse spray, the Angiojet was again utilized in thrombectomy mode to extract the thrombus from the left upper extremity, subclavian and innominate veins. Residual occlusive thrombus in the central left subclavian and innominate vein was treated with additional passes of the Angiojet thrombectomy catheter. This was exchanged for a 10 mm Mustang balloon, advanced to the confluence of the left innominate vein and SVC for dilatation of a persistent stenosis. In similar fashion, the 10 mm balloon was advanced via the right IJ access for PTA of a proximal SVC stenosis. Final venography at both sites was performed. The catheters, guidewires and sheaths were removed and hemostasis achieved at the site with manual compression. The patient tolerated the procedure well.  FINDINGS: Left upper extremity venography demonstrated occlusive clot in the central aspect of the left brachial vein extending through the axillary vein, left subclavian vein and left innominate vein. There is tapered  narrowing of the innominate vein near its confluence with the SVC. There was non opacification of the SVC. The left upper extremity, subclavian, and innominate thrombus was treated with mechanical thrombectomy after pulse spray of tPA as detailed above. No significant residual clot on final venography.  Right IJ venography demonstrated patency of the SVC. The stent extends from the distal right innominate vein to the SVC, covering the inflow of the left innominate vein. No collateral filling was identified. There is mild narrowing of the stent at the confluence of the innominate veins.  The central left innominate vein stenosis responded well to 10 mm balloon angioplasty. Follow-up venography showed significant improvement in diameter of the vein at its confluence with the SVC at the level of the sidewall stent. The proximal SVC stenosis responded readily to 10 mm balloon angioplasty and follow-up venography showed no extravasation, residual or recurrent stenosis.  IMPRESSION: 1. Extensive occlusive left upper extremity, subclavian, and innominate vein DVT, with successful TPA assisted mechanical thrombectomy. 2. The SVC stent remains patent. Non opacification on prior CT probably secondary to injection through the port catheter. 3. The SVC stent covers the inflow from the left innominate vein. Additionally, tapered narrowing of the central left innominate vein just proximal to its confluence with the SVC suggests involvement by the patient's known recurrent tumor. Although this region responded to 10 mm balloon angioplasty, she remains at risk for recurrence of thrombosis and should probably REMAIN ON LIFELONG ANTICOAGULATION if tolerated. 4. Mild proximal SVC stent stenosis, with good response to 10 mm balloon angioplasty.   Electronically Signed   By: Lucrezia Europe M.D.   On: 12/01/2014 12:54   Ir US Guide Vasc Access Left  12/01/2014   INDICATION: Right SVC clot and  left arm brachial vein clot. In a patient with  history of recurrent non-small cell lung cancer in the right upper lobe. 12 mm SVC stent placed 10/17/2014.  EXAM: THROMBOECTOMY MECHANICAL VENOUS; PTA VENOUS; LEFT EXTREMITY VENOGRAPHY; IR ULTRASOUND GUIDANCE VASC ACCESS RIGHT; IR ULTRASOUND GUIDANCE VASC ACCESS LEFT; SUPERIOR VENA CAVOGRAM  COMPARISON:  CT 11/30/2014 and earlier studies  MEDICATIONS: Intravenous Fentanyl and Versed were administered as conscious sedation during continuous cardiorespiratory monitoring by the radiology RN, with a total moderate sedation time of 60 minutes.  CONTRAST:  12mL OMNIPAQUE IOHEXOL 300 MG/ML  SOLN  FLUOROSCOPY TIME:  5 minutes.  42 seconds.  COMPLICATIONS: None immediate  TECHNIQUE: Informed written consent was obtained from the patient after a discussion of the risks, benefits and alternatives to treatment. Questions regarding the procedure were encouraged and answered. A timeout was performed prior to the initiation of the procedure.  Survey ultrasound of the left upper extremity was performed. The brachial DVT was localized and a more peripheral patent segment of the brachial vein was identified for access. Overlying skin site was marked.  The site was prepped and draped in the usual sterile fashion, and a sterile drape was applied covering the operative field. Maximum barrier sterile technique with sterile gowns and gloves were used for the procedure. A timeout was performed prior to the initiation of the procedure. Local anesthesia was provided with 1% lidocaine.  Under real-time ultrasound guidance, access to the left brachial vein was achieved with a micropuncture set. Left upper extremity venography was performed. The dilator was exchanged over a Bentson for a 6 [place] vascular sheath, through which a 5 [place] Kumpe catheter was advanced to the SVC. This was exchanged for a Angiojet catheter, which allowed pulse spray delivery of 10 mg tPA in100 mL sterile saline through the left upper extremity, subclavian and  innominate vein clot. The Angiojet catheter would not advance across the stent into the SVC, so right IJ access was achieved. As before, the region was prepped and draped in usual sterile fashion and ultrasound-guided micropuncture access to the right IJ was achieved. Venography was performed. After the TPA had been allowed least 20 minutes post pulse spray, the Angiojet was again utilized in thrombectomy mode to extract the thrombus from the left upper extremity, subclavian and innominate veins. Residual occlusive thrombus in the central left subclavian and innominate vein was treated with additional passes of the Angiojet thrombectomy catheter. This was exchanged for a 10 mm Mustang balloon, advanced to the confluence of the left innominate vein and SVC for dilatation of a persistent stenosis. In similar fashion, the 10 mm balloon was advanced via the right IJ access for PTA of a proximal SVC stenosis. Final venography at both sites was performed. The catheters, guidewires and sheaths were removed and hemostasis achieved at the site with manual compression. The patient tolerated the procedure well.  FINDINGS: Left upper extremity venography demonstrated occlusive clot in the central aspect of the left brachial vein extending through the axillary vein, left subclavian vein and left innominate vein. There is tapered narrowing of the innominate vein near its confluence with the SVC. There was non opacification of the SVC. The left upper extremity, subclavian, and innominate thrombus was treated with mechanical thrombectomy after pulse spray of tPA as detailed above. No significant residual clot on final venography.  Right IJ venography demonstrated patency of the SVC. The stent extends from the distal right innominate vein to the SVC, covering the inflow of the left innominate vein.  No collateral filling was identified. There is mild narrowing of the stent at the confluence of the innominate veins.  The central left  innominate vein stenosis responded well to 10 mm balloon angioplasty. Follow-up venography showed significant improvement in diameter of the vein at its confluence with the SVC at the level of the sidewall stent. The proximal SVC stenosis responded readily to 10 mm balloon angioplasty and follow-up venography showed no extravasation, residual or recurrent stenosis.  IMPRESSION: 1. Extensive occlusive left upper extremity, subclavian, and innominate vein DVT, with successful TPA assisted mechanical thrombectomy. 2. The SVC stent remains patent. Non opacification on prior CT probably secondary to injection through the port catheter. 3. The SVC stent covers the inflow from the left innominate vein. Additionally, tapered narrowing of the central left innominate vein just proximal to its confluence with the SVC suggests involvement by the patient's known recurrent tumor. Although this region responded to 10 mm balloon angioplasty, she remains at risk for recurrence of thrombosis and should probably REMAIN ON LIFELONG ANTICOAGULATION if tolerated. 4. Mild proximal SVC stent stenosis, with good response to 10 mm balloon angioplasty.   Electronically Signed   By: Lucrezia Europe M.D.   On: 12/01/2014 12:54   Ir US Guide Vasc Access Right  12/01/2014   INDICATION: Right SVC clot and left arm brachial vein clot. 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 12 mm SVC stent placed 10/17/2014.  EXAM: THROMBOECTOMY MECHANICAL VENOUS; PTA VENOUS; LEFT EXTREMITY VENOGRAPHY; IR ULTRASOUND GUIDANCE VASC ACCESS RIGHT; IR ULTRASOUND GUIDANCE VASC ACCESS LEFT; SUPERIOR VENA CAVOGRAM  COMPARISON:  CT 11/30/2014 and earlier studies  MEDICATIONS: Intravenous Fentanyl and Versed were administered as conscious sedation during continuous cardiorespiratory monitoring by the radiology RN, with a total moderate sedation time of 60 minutes.  CONTRAST:  29mL OMNIPAQUE IOHEXOL 300 MG/ML  SOLN  FLUOROSCOPY  TIME:  5 minutes.  42 seconds.  COMPLICATIONS: None immediate  TECHNIQUE: Informed written consent was obtained from the patient after a discussion of the risks, benefits and alternatives to treatment. Questions regarding the procedure were encouraged and answered. A timeout was performed prior to the initiation of the procedure.  Survey ultrasound of the left upper extremity was performed. The brachial DVT was localized and a more peripheral patent segment of the brachial vein was identified for access. Overlying skin site was marked.  The site was prepped and draped in the usual sterile fashion, and a sterile drape was applied covering the operative field. Maximum barrier sterile technique with sterile gowns and gloves were used for the procedure. A timeout was performed prior to the initiation of the procedure. Local anesthesia was provided with 1% lidocaine.  Under real-time ultrasound guidance, access to the left brachial vein was achieved with a micropuncture set. Left upper extremity venography was performed. The dilator was exchanged over a Bentson for a 6 [place] vascular sheath, through which a 5 [place] Kumpe catheter was advanced to the SVC. This was exchanged for a Angiojet catheter, which allowed pulse spray delivery of 10 mg tPA in100 mL sterile saline through the left upper extremity, subclavian and innominate vein clot. The Angiojet catheter would not advance across the stent into the SVC, so right IJ access was achieved. As before, the region was prepped and draped in usual sterile fashion and ultrasound-guided micropuncture access to the right IJ was achieved. Venography was performed. After the TPA had been allowed least 20 minutes post pulse spray, the  Angiojet was again utilized in thrombectomy mode to extract the thrombus from the left upper extremity, subclavian and innominate veins. Residual occlusive thrombus in the central left subclavian and innominate vein was treated with additional  passes of the Angiojet thrombectomy catheter. This was exchanged for a 10 mm Mustang balloon, advanced to the confluence of the left innominate vein and SVC for dilatation of a persistent stenosis. In similar fashion, the 10 mm balloon was advanced via the right IJ access for PTA of a proximal SVC stenosis. Final venography at both sites was performed. The catheters, guidewires and sheaths were removed and hemostasis achieved at the site with manual compression. The patient tolerated the procedure well.  FINDINGS: Left upper extremity venography demonstrated occlusive clot in the central aspect of the left brachial vein extending through the axillary vein, left subclavian vein and left innominate vein. There is tapered narrowing of the innominate vein near its confluence with the SVC. There was non opacification of the SVC. The left upper extremity, subclavian, and innominate thrombus was treated with mechanical thrombectomy after pulse spray of tPA as detailed above. No significant residual clot on final venography.  Right IJ venography demonstrated patency of the SVC. The stent extends from the distal right innominate vein to the SVC, covering the inflow of the left innominate vein. No collateral filling was identified. There is mild narrowing of the stent at the confluence of the innominate veins.  The central left innominate vein stenosis responded well to 10 mm balloon angioplasty. Follow-up venography showed significant improvement in diameter of the vein at its confluence with the SVC at the level of the sidewall stent. The proximal SVC stenosis responded readily to 10 mm balloon angioplasty and follow-up venography showed no extravasation, residual or recurrent stenosis.  IMPRESSION: 1. Extensive occlusive left upper extremity, subclavian, and innominate vein DVT, with successful TPA assisted mechanical thrombectomy. 2. The SVC stent remains patent. Non opacification on prior CT probably secondary to  injection through the port catheter. 3. The SVC stent covers the inflow from the left innominate vein. Additionally, tapered narrowing of the central left innominate vein just proximal to its confluence with the SVC suggests involvement by the patient's known recurrent tumor. Although this region responded to 10 mm balloon angioplasty, she remains at risk for recurrence of thrombosis and should probably REMAIN ON LIFELONG ANTICOAGULATION if tolerated. 4. Mild proximal SVC stent stenosis, with good response to 10 mm balloon angioplasty.   Electronically Signed   By: Lucrezia Europe M.D.   On: 12/01/2014 12:54   Dg Chest Port 1 View  11/30/2014   CLINICAL DATA:  Lung cancer with bilateral upper extremity edema  EXAM: PORTABLE CHEST - 1 VIEW  COMPARISON:  Chest radiograph October 09, 2014 and chest CT November 03, 2014  FINDINGS: The previously noted lung carcinoma in the right upper lobe is again present measuring approximately 4.2 x 3.4 cm. Is tumor appears to extend into the right hilum, unchanged. Lungs elsewhere clear. Heart size and pulmonary vascularity are normal. No adenopathy. Port-A-Cath tip is in the superior vena cava. No pneumothorax. There is a stent in the superior vena cava region, stable.  IMPRESSION: Persistent right upper lobe mass extending in the right hilum. Stent in superior vena cava. No new opacity. No change in cardiac silhouette.   Electronically Signed   By: Lowella Grip III M.D.   On: 11/30/2014 15:49     CBC  Recent Labs Lab 11/28/14 1320  11/30/14 1450 12/01/14 0428 12/02/14  5784 12/03/14 0042 12/03/14 0754 12/04/14 0430  WBC 5.4  --  4.0 4.4 5.2 5.4  --  4.5  HGB 11.1*  < > 10.3* 10.2* 9.5* 8.6* 8.9* 8.3*  HCT 34.6*  < > 32.7* 32.0* 29.5* 26.5* 27.7* 26.0*  PLT 207  --  226 209 181 181  --  169  MCV 83.6  --  83.8 84.2 81.7 82.6  --  81.8  MCH 26.8  --  26.4 26.8 26.3 26.8  --  26.1  MCHC 32.1  --  31.5 31.9 32.2 32.5  --  31.9  RDW 16.4*  --  16.7* 16.7* 16.5*  16.4*  --  16.8*  LYMPHSABS 1.1  --   --   --   --   --   --   --   MONOABS 0.9  --   --   --   --   --   --   --   EOSABS 0.1  --   --   --   --   --   --   --   BASOSABS 0.1  --   --   --   --   --   --   --   < > = values in this interval not displayed.  Chemistries   Recent Labs Lab 11/28/14 1320 11/30/14 1450 12/02/14 0447  NA 140 137 135  K 3.8 4.0 3.7  CL  --  102 97  CO2 22 26 30   GLUCOSE 219* 178* 173*  BUN 5.6* 7 <5*  CREATININE 0.8 0.59 0.59  CALCIUM 8.3* 8.1* 7.9*  AST 20  --   --   ALT 18  --   --   ALKPHOS 80  --   --   BILITOT 0.23  --   --    ------------------------------------------------------------------------------------------------------------------ estimated creatinine clearance is 81.3 mL/min (by C-G formula based on Cr of 0.59). ------------------------------------------------------------------------------------------------------------------ No results for input(s): HGBA1C in the last 72 hours. ------------------------------------------------------------------------------------------------------------------ No results for input(s): CHOL, HDL, LDLCALC, TRIG, CHOLHDL, LDLDIRECT in the last 72 hours. ------------------------------------------------------------------------------------------------------------------ No results for input(s): TSH, T4TOTAL, T3FREE, THYROIDAB in the last 72 hours.  Invalid input(s): FREET3 ------------------------------------------------------------------------------------------------------------------ No results for input(s): VITAMINB12, FOLATE, FERRITIN, TIBC, IRON, RETICCTPCT in the last 72 hours.  Coagulation profile  Recent Labs Lab 11/30/14 1444  INR 1.14    No results for input(s): DDIMER in the last 72 hours.  Cardiac Enzymes No results for input(s): CKMB, TROPONINI, MYOGLOBIN in the last 168 hours.  Invalid input(s):  CK ------------------------------------------------------------------------------------------------------------------ Invalid input(s): POCBNP     Time Spent in minutes  35   Lala Lund K M.D on 12/04/2014 at 10:16 AM  Between 7am to 7pm - Pager - [phone]  After 7pm go to [email or web address] - [facility] Office  [phone]

## 2014-12-04 NOTE — Progress Notes (Signed)
Left upper extremity venous duplex completed:  No evidence of DVT or superficial thrombosis.

## 2014-12-05 ENCOUNTER — Ambulatory Visit: Payer: Medicare Other

## 2014-12-05 ENCOUNTER — Telehealth: Payer: Self-pay | Admitting: Internal Medicine

## 2014-12-05 ENCOUNTER — Other Ambulatory Visit: Payer: Medicare Other

## 2014-12-05 ENCOUNTER — Inpatient Hospital Stay (HOSPITAL_COMMUNITY): Payer: Medicare Other

## 2014-12-05 DIAGNOSIS — L0390 Cellulitis, unspecified: Secondary | ICD-10-CM | POA: Insufficient documentation

## 2014-12-05 LAB — BASIC METABOLIC PANEL
Anion gap: 6 (ref 5–15)
CO2: 30 mmol/L (ref 19–32)
CREATININE: 0.53 mg/dL (ref 0.50–1.10)
Calcium: 8.2 mg/dL — ABNORMAL LOW (ref 8.4–10.5)
Chloride: 101 mmol/L (ref 96–112)
GFR calc non Af Amer: >90 mL/min (ref >90)
Glucose, Bld: 141 mg/dL — ABNORMAL HIGH (ref 70–99)
Potassium: 3.9 mmol/L (ref 3.5–5.1)
Sodium: 137 mmol/L (ref 135–145)

## 2014-12-05 LAB — CBC
HEMATOCRIT: 25.4 % — AB (ref 36.0–46.0)
Hemoglobin: 8.3 g/dL — ABNORMAL LOW (ref 12.0–15.0)
MCH: 27 pg (ref 26.0–34.0)
MCHC: 32.7 g/dL (ref 30.0–36.0)
MCV: 82.7 fL (ref 78.0–100.0)
PLATELETS: 117 10*3/uL — AB (ref 150–400)
RBC: 3.07 MIL/uL — AB (ref 3.87–5.11)
RDW: 16.6 % — ABNORMAL HIGH (ref 11.5–15.5)
WBC: 2.1 10*3/uL — AB (ref 4.0–10.5)

## 2014-12-05 LAB — GLUCOSE, CAPILLARY
GLUCOSE-CAPILLARY: 142 mg/dL — AB (ref 70–99)
GLUCOSE-CAPILLARY: 303 mg/dL — AB (ref 70–99)
GLUCOSE-CAPILLARY: 144 mg/dL — AB (ref 70–99)

## 2014-12-05 MED ORDER — INSULIN GLARGINE 100 UNIT/ML ~~LOC~~ SOLN
10.0000 [IU] | Freq: Every day | SUBCUTANEOUS | Status: DC
  Administered 2014-12-05 – 2014-12-06 (×2): 10 [IU] via SUBCUTANEOUS
  Filled 2014-12-05 (×2): qty 0.1

## 2014-12-05 MED ORDER — HEPARIN SOD (PORK) LOCK FLUSH 100 UNIT/ML IV SOLN: 500.0000 [IU] | INTRAVENOUS | Status: DC | PRN

## 2014-12-05 NOTE — Progress Notes (Signed)
Pt's arm elevated and ice pack applied.  Will continue to monitor. Graceann Congress

## 2014-12-05 NOTE — Progress Notes (Signed)
Patient Demographics  Janice Ball, is a 59 y.o. female, DOB - 30-Nov-1955, EHM:[id number]  Admit date - 11/30/2014   Admitting Physician Debbe Odea, MD  Outpatient Primary MD for the patient is [name], NP  LOS - 5   Chief Complaint  Patient presents with  . Arm Swelling  . Shortness of Breath  . chemo         Subjective:   Janice Ball today has, No headache, No chest pain, No abdominal pain - No Nausea, No new weakness tingling or numbness, No Cough - SOB.    Assessment & Plan    1. Right SVC Stent with suspected clot and new left arm brachial vein clot. 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 Status post venogram and TPA by IR, venogram revealed patent SVC tent on the right side and extensive left upper extremity clot burden requiring TPA, she developed some hematuria 12-01-14 after her TPA and Lovenox.   Hematuria has resolved, discussed her case with her oncologist Dr. [name] on 12/03/2014 with switch her to Lovenox if no further hematuria discharged on Lovenox. Discussed over the phone with Dr. Bridgett Larsson vascular surgeon on 12/01/2014.    2. Right upper lobe non-small cell lung cancer. Undergoing repeat chemotherapy under Dr. [name]. Outpatient follow-up post discharge.   3. Baseline sinus tachycardia. Heart rate between 110 and 120 at baseline reviewed Dr. Worthy Flank office note. Stable.   4. Hypothyroidism. Continue Synthroid. Request PCP to check TSH 1 time on neck visit.   5. Diabetes mellitus type 2. Currently on sliding scale. Added Lantus, continue POGlucotrol.  CBG (last 3)   Recent Labs  12/04/14 2107 12/05/14 0623 12/05/14 1053  GLUCAP 290* 142* 303*    6. Cellulitis - Left arm swelling with redness on  12/04/2014. Suspicious for cellulitis versus local reaction to TPA. Place on vancomycin, obtain soft tissue ultrasound of the left arm to rule out any fluid collection underneath. Monitor CBC and monitor clinically. Currently afebrile.      Code Status: Full  Family Communication: None  Disposition Plan: Home   Procedures CT angiogram chest, venous duplex left arm,  IR TPA-  IMPRESSION: 1. Extensive occlusive left upper extremity, subclavian, and innominate vein DVT, with successful TPA assisted mechanical thrombectomy. 2. The SVC stent remains patent. Non opacification on prior CT probably secondary to injection through the port catheter. 3. The SVC stent covers the inflow from the left innominate vein. Additionally, tapered narrowing of the central left innominate vein just proximal to its confluence with the SVC suggests involvement by the patient's known recurrent tumor. Although this region responded to 10 mm balloon angioplasty, she remains at risk for recurrence of thrombosis and should probably REMAIN ON LIFELONG ANTICOAGULATION if tolerated. 4. Mild proximal SVC stent stenosis, with good response to 10 mm balloon angioplasty.    Consults  IR, Vas Surg Dr Bridgett Larsson over the phone     Medications  Scheduled Meds: . antiseptic oral rinse  7 mL Mouth Rinse BID  . budesonide-formoterol  2 puff Inhalation BID  . enoxaparin (LOVENOX) injection  120 mg Subcutaneous Q24H  . glimepiride  2 mg Oral Q breakfast  . guaiFENesin  1,200 mg Oral  BID  . insulin aspart  0-5 Units Subcutaneous QHS  . insulin aspart  0-9 Units Subcutaneous TID WC  . levothyroxine  125 mcg Oral QAC breakfast  . sertraline  150 mg Oral QHS  . sodium chloride  10-40 mL Intracatheter Q12H  . sodium chloride  3 mL Intravenous Q12H  . spironolactone  25 mg Oral Daily  . vancomycin  1,000 mg Intravenous Q12H   Continuous Infusions:   PRN Meds:.sodium chloride, acetaminophen, alum & mag hydroxide-simeth,  guaiFENesin-dextromethorphan, HYDROcodone-acetaminophen, levalbuterol, LORazepam, morphine injection, ondansetron **OR** ondansetron (ZOFRAN) IV, oxyCODONE, prochlorperazine, sodium chloride, sodium chloride  DVT Prophylaxis    Heparin  gtt  Lab Results  Component Value Date   PLT 117* 12/05/2014    Antibiotics     Anti-infectives    Start     Dose/Rate Route Frequency Ordered Stop   12/04/14 1100  vancomycin (VANCOCIN) IVPB 1000 mg/200 mL premix     1,000 mg 200 mL/hr over 60 Minutes Intravenous Every 12 hours 12/04/14 1038            Objective:   Filed Vitals:   12/04/14 2102 12/05/14 0029 12/05/14 0550 12/05/14 0900  BP:  98/66 113/65 109/59  Pulse:  101 101 109  Temp:  98.5 F (36.9 C) 97.5 F (36.4 C) 97.6 F (36.4 C)  TempSrc:  Oral Oral Oral  Resp:  16 18 18   Height:      Weight:   78.744 kg (173 lb 9.6 oz)   SpO2: 98% 98% 98% 97%    Wt Readings from Last 3 Encounters:  12/05/14 78.744 kg (173 lb 9.6 oz)  11/28/14 79.606 kg (175 lb 8 oz)  11/06/14 80.74 kg (178 lb)     Intake/Output Summary (Last 24 hours) at 12/05/14 1242 Last data filed at 12/05/14 1000  Gross per 24 hour  Intake   1734 ml  Output   2200 ml  Net   -466 ml     Physical Exam  Awake Alert, Oriented X 3, No new F.N deficits, Normal affect [place].AT,PERRAL Supple Neck,No JVD, No cervical lymphadenopathy appriciated.  Symmetrical Chest wall movement, Good air movement bilaterally, CTAB RRR,No Gallops,Rubs or new Murmurs, No Parasternal Heave +ve B.Sounds, Abd Soft, No tenderness, No organomegaly appriciated, No rebound - guarding or rigidity. No Cyanosis, Clubbing or edema, No new Rash or bruise  , L arm swollen today it's red at the site of TPA infusion   Data Review   Micro Results Recent Results (from the past 240 hour(s))  MRSA PCR Screening     Status: None   Collection Time: 11/30/14  8:52 PM  Result Value Ref Range Status   MRSA by PCR NEGATIVE NEGATIVE Final    Comment:         The GeneXpert MRSA Assay (FDA approved for NASAL specimens only), is one component of a comprehensive MRSA colonization surveillance program. It is not intended to diagnose MRSA infection nor to guide or monitor treatment for MRSA infections.     Radiology Reports Ct Angio Chest Pe W/cm &/or Wo Cm  11/30/2014   ADDENDUM REPORT: 11/30/2014 16:59  ADDENDUM: Critical Value/emergent results were called by telephone at the time of interpretation on 11/30/2014 at 4:56 pm to Dr. Noemi Chapel , who verbally acknowledged these results.   Electronically Signed   By: Genevie Ann M.D.   On: 11/30/2014 16:59   11/30/2014   CLINICAL DATA:  58 year old female with shortness of breath. Positive left upper extremity DVT.  History of lung cancer and COPD.   Initial encounter.  EXAM: CT ANGIOGRAPHY CHEST WITH CONTRAST  TECHNIQUE: Multidetector CT imaging of the chest was performed using the standard protocol during bolus administration of intravenous contrast. Multiplanar CT image reconstructions and MIPs were obtained to evaluate the vascular anatomy.  CONTRAST:  1100mL OMNIPAQUE IOHEXOL 350 MG/ML SOLN  COMPARISON:  Chest CTA 10/09/2014.  Common chest CTA 11/03/2014.  FINDINGS: Good contrast bolus timing in the pulmonary arterial tree. No pulmonary artery filling defect consistent with pulmonary embolus.  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 The right chest porta cath was injected common the catheter passed through a vascular stent of the SVC, which does also thrombosis (series 6, image 104). Some of the right upper lobe pulmonary arteries are significantly narrowed by tumor (image when 18).  At the same time mediastinal tumor bulk has slightly regressed since 10/09/2014 (such as on series 4, image 35 measuring 48 x 43 mm canal versus 57 x 59 mm at a comparable level previously).  No pericardial or pleural effusion.  Narrowing of the carina and central right lung airways has not significantly changed. Spiculated peribronchovascular opacity in the right upper lobe is stable to slightly regressed. No areas of worsening ventilation.  Stable visualized aorta and great vessels with atherosclerosis. Stable visualized upper abdominal viscera. No acute osseous abnormality identified.  Review of the MIP images confirms the above findings.  IMPRESSION: 1. 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 2. Tumor bulky is stable to slightly regressed since December. 3. No new chest abnormality identified.  Electronically Signed: By: Genevie Ann M.D. On: 11/30/2014 16:53   Ct Angio Chest Pe W/cm &/or Wo Cm  11/03/2014   CLINICAL DATA:  Increased chest pain and SOB s/p recent stent placement. 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 History of COPD and lung cancer (receives chemo).Nurse note: Pt reports chest pain and sob intermittent over last few days. But consistent since last night. Sob worse with exertion. Had ?pulmonary artery stent placed 2 weeks ago. Sts "I'm not sure if the stent is even working, it feels the same as it did before the stent. 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 Denies n/v, dizziness.  EXAM: CT ANGIOGRAPHY CHEST WITH CONTRAST  TECHNIQUE: Multidetector CT imaging of the chest was performed using the standard protocol during bolus administration of intravenous contrast. Multiplanar CT image reconstructions and MIPs were obtained to evaluate the vascular anatomy.  CONTRAST:  100 cc Omnipaque 350  COMPARISON:  Chest CT dated 09/02/2014.  FINDINGS: The previously demonstrated right hilar mass is larger. The posterior extension previously measured 4.7 x 2.6 cm and currently measures 6.4 x 3.3 cm with increased vascular and bronchial encasement. The  previously demonstrated 5.0 x 2.8 cm precarinal mediastinal component currently measures 5.5 x 2.3 cm. By my measurements, this previously measure 5.0 x 2.1 cm and corresponding dimensions.  A previously demonstrated 6 mm short axis right hilar lymph node is increased in size, currently A 10.5 mm short axis on image number 43. No enlarged lymph nodes elsewhere in the chest or upper abdomen.  There is a minimal pericardial effusion with a maximum thickness of 6 mm. There are multiple small, sub cm nodules in both lungs. Some of these are new and  some are enlarging. An old, healed right anterolateral rib fracture is again demonstrated. No bone metastases are visualized.  The pulmonary arteries are normally opacified with no pulmonary arterial filling defects seen. Moderate thoracic scoliosis is noted.  Review of the MIP images confirms the above findings.  IMPRESSION: 1.  No pulmonary emboli.  2. Enlarging right hilar and mediastinal lung cancer with increased vascular and bronchial encasement.  3. Multiple sub cm new and enlarging nodules in both lungs. These are suspicious for early metastases.  4.  Progressive right hilar metastatic adenopathy.   Electronically Signed   By: Enrique Sack M.D.   On: 11/03/2014 14:10   Ir Veno/ext/uni Left  12/01/2014   INDICATION: Right SVC clot and left arm brachial vein clot. 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 12 mm SVC stent placed 10/17/2014.  EXAM: THROMBOECTOMY MECHANICAL VENOUS; PTA VENOUS; LEFT EXTREMITY VENOGRAPHY; IR ULTRASOUND GUIDANCE VASC ACCESS RIGHT; IR ULTRASOUND GUIDANCE VASC ACCESS LEFT; SUPERIOR VENA CAVOGRAM  COMPARISON:  CT 11/30/2014 and earlier studies  MEDICATIONS: Intravenous Fentanyl and Versed were administered as conscious sedation during continuous cardiorespiratory monitoring by the radiology RN, with a total moderate sedation time of 60 minutes.  CONTRAST:  74mL OMNIPAQUE IOHEXOL 300 MG/ML  SOLN   FLUOROSCOPY TIME:  5 minutes.  42 seconds.  COMPLICATIONS: None immediate  TECHNIQUE: Informed written consent was obtained from the patient after a discussion of the risks, benefits and alternatives to treatment. Questions regarding the procedure were encouraged and answered. A timeout was performed prior to the initiation of the procedure.  Survey ultrasound of the left upper extremity was performed. The brachial DVT was localized and a more peripheral patent segment of the brachial vein was identified for access. Overlying skin site was marked.  The site was prepped and draped in the usual sterile fashion, and a sterile drape was applied covering the operative field. Maximum barrier sterile technique with sterile gowns and gloves were used for the procedure. A timeout was performed prior to the initiation of the procedure. Local anesthesia was provided with 1% lidocaine.  Under real-time ultrasound guidance, access to the left brachial vein was achieved with a micropuncture set. Left upper extremity venography was performed. The dilator was exchanged over a Bentson for a 6 [place] vascular sheath, through which a 5 [place] Kumpe catheter was advanced to the SVC. This was exchanged for a Angiojet catheter, which allowed pulse spray delivery of 10 mg tPA in100 mL sterile saline through the left upper extremity, subclavian and innominate vein clot. The Angiojet catheter would not advance across the stent into the SVC, so right IJ access was achieved. As before, the region was prepped and draped in usual sterile fashion and ultrasound-guided micropuncture access to the right IJ was achieved. Venography was performed. After the TPA had been allowed least 20 minutes post pulse spray, the Angiojet was again utilized in thrombectomy mode to extract the thrombus from the left upper extremity, subclavian and innominate veins. Residual occlusive thrombus in the central left subclavian and innominate vein was treated with  additional passes of the Angiojet thrombectomy catheter. This was exchanged for a 10 mm Mustang balloon, advanced to the confluence of the left innominate vein and SVC for dilatation of a persistent stenosis. In similar fashion, the 10 mm balloon was advanced via the right IJ access for PTA of a proximal SVC stenosis. Final venography at both sites was performed. The catheters, guidewires and sheaths  were removed and hemostasis achieved at the site with manual compression. The patient tolerated the procedure well.  FINDINGS: Left upper extremity venography demonstrated occlusive clot in the central aspect of the left brachial vein extending through the axillary vein, left subclavian vein and left innominate vein. There is tapered narrowing of the innominate vein near its confluence with the SVC. There was non opacification of the SVC. The left upper extremity, subclavian, and innominate thrombus was treated with mechanical thrombectomy after pulse spray of tPA as detailed above. No significant residual clot on final venography.  Right IJ venography demonstrated patency of the SVC. The stent extends from the distal right innominate vein to the SVC, covering the inflow of the left innominate vein. No collateral filling was identified. There is mild narrowing of the stent at the confluence of the innominate veins.  The central left innominate vein stenosis responded well to 10 mm balloon angioplasty. Follow-up venography showed significant improvement in diameter of the vein at its confluence with the SVC at the level of the sidewall stent. The proximal SVC stenosis responded readily to 10 mm balloon angioplasty and follow-up venography showed no extravasation, residual or recurrent stenosis.  IMPRESSION: 1. Extensive occlusive left upper extremity, subclavian, and innominate vein DVT, with successful TPA assisted mechanical thrombectomy. 2. The SVC stent remains patent. Non opacification on prior CT probably secondary  to injection through the port catheter. 3. The SVC stent covers the inflow from the left innominate vein. Additionally, tapered narrowing of the central left innominate vein just proximal to its confluence with the SVC suggests involvement by the patient's known recurrent tumor. Although this region responded to 10 mm balloon angioplasty, she remains at risk for recurrence of thrombosis and should probably REMAIN ON LIFELONG ANTICOAGULATION if tolerated. 4. Mild proximal SVC stent stenosis, with good response to 10 mm balloon angioplasty.   Electronically Signed   By: Lucrezia Europe M.D.   On: 12/01/2014 12:54   Ir Venocavagram Svc  12/01/2014   INDICATION: Right SVC clot and left arm brachial vein clot. 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 12 mm SVC stent placed 10/17/2014.  EXAM: THROMBOECTOMY MECHANICAL VENOUS; PTA VENOUS; LEFT EXTREMITY VENOGRAPHY; IR ULTRASOUND GUIDANCE VASC ACCESS RIGHT; IR ULTRASOUND GUIDANCE VASC ACCESS LEFT; SUPERIOR VENA CAVOGRAM  COMPARISON:  CT 11/30/2014 and earlier studies  MEDICATIONS: Intravenous Fentanyl and Versed were administered as conscious sedation during continuous cardiorespiratory monitoring by the radiology RN, with a total moderate sedation time of 60 minutes.  CONTRAST:  26mL OMNIPAQUE IOHEXOL 300 MG/ML  SOLN  FLUOROSCOPY TIME:  5 minutes.  42 seconds.  COMPLICATIONS: None immediate  TECHNIQUE: Informed written consent was obtained from the patient after a discussion of the risks, benefits and alternatives to treatment. Questions regarding the procedure were encouraged and answered. A timeout was performed prior to the initiation of the procedure.  Survey ultrasound of the left upper extremity was performed. The brachial DVT was localized and a more peripheral patent segment of the brachial vein was identified for access. Overlying skin site was marked.  The site was prepped and draped in the usual sterile fashion, and  a sterile drape was applied covering the operative field. Maximum barrier sterile technique with sterile gowns and gloves were used for the procedure. A timeout was performed prior to the initiation of the procedure. Local anesthesia was provided with 1% lidocaine.  Under real-time ultrasound guidance, access to the left brachial vein was  achieved with a micropuncture set. Left upper extremity venography was performed. The dilator was exchanged over a Bentson for a 6 [place] vascular sheath, through which a 5 [place] Kumpe catheter was advanced to the SVC. This was exchanged for a Angiojet catheter, which allowed pulse spray delivery of 10 mg tPA in100 mL sterile saline through the left upper extremity, subclavian and innominate vein clot. The Angiojet catheter would not advance across the stent into the SVC, so right IJ access was achieved. As before, the region was prepped and draped in usual sterile fashion and ultrasound-guided micropuncture access to the right IJ was achieved. Venography was performed. After the TPA had been allowed least 20 minutes post pulse spray, the Angiojet was again utilized in thrombectomy mode to extract the thrombus from the left upper extremity, subclavian and innominate veins. Residual occlusive thrombus in the central left subclavian and innominate vein was treated with additional passes of the Angiojet thrombectomy catheter. This was exchanged for a 10 mm Mustang balloon, advanced to the confluence of the left innominate vein and SVC for dilatation of a persistent stenosis. In similar fashion, the 10 mm balloon was advanced via the right IJ access for PTA of a proximal SVC stenosis. Final venography at both sites was performed. The catheters, guidewires and sheaths were removed and hemostasis achieved at the site with manual compression. The patient tolerated the procedure well.  FINDINGS: Left upper extremity venography demonstrated occlusive clot in the central aspect of the left  brachial vein extending through the axillary vein, left subclavian vein and left innominate vein. There is tapered narrowing of the innominate vein near its confluence with the SVC. There was non opacification of the SVC. The left upper extremity, subclavian, and innominate thrombus was treated with mechanical thrombectomy after pulse spray of tPA as detailed above. No significant residual clot on final venography.  Right IJ venography demonstrated patency of the SVC. The stent extends from the distal right innominate vein to the SVC, covering the inflow of the left innominate vein. No collateral filling was identified. There is mild narrowing of the stent at the confluence of the innominate veins.  The central left innominate vein stenosis responded well to 10 mm balloon angioplasty. Follow-up venography showed significant improvement in diameter of the vein at its confluence with the SVC at the level of the sidewall stent. The proximal SVC stenosis responded readily to 10 mm balloon angioplasty and follow-up venography showed no extravasation, residual or recurrent stenosis.  IMPRESSION: 1. Extensive occlusive left upper extremity, subclavian, and innominate vein DVT, with successful TPA assisted mechanical thrombectomy. 2. The SVC stent remains patent. Non opacification on prior CT probably secondary to injection through the port catheter. 3. The SVC stent covers the inflow from the left innominate vein. Additionally, tapered narrowing of the central left innominate vein just proximal to its confluence with the SVC suggests involvement by the patient's known recurrent tumor. Although this region responded to 10 mm balloon angioplasty, she remains at risk for recurrence of thrombosis and should probably REMAIN ON LIFELONG ANTICOAGULATION if tolerated. 4. Mild proximal SVC stent stenosis, with good response to 10 mm balloon angioplasty.   Electronically Signed   By: Lucrezia Europe M.D.   On: 12/01/2014 12:54   Ir Pta  Venous Left  12/01/2014   INDICATION: Right SVC clot and left arm brachial vein clot. 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 12 mm SVC stent placed 10/17/2014.  EXAM: THROMBOECTOMY MECHANICAL VENOUS; PTA VENOUS; LEFT EXTREMITY VENOGRAPHY; IR ULTRASOUND GUIDANCE VASC ACCESS RIGHT; IR ULTRASOUND GUIDANCE VASC ACCESS LEFT; SUPERIOR VENA CAVOGRAM  COMPARISON:  CT 11/30/2014 and earlier studies  MEDICATIONS: Intravenous Fentanyl and Versed were administered as conscious sedation during continuous cardiorespiratory monitoring by the radiology RN, with a total moderate sedation time of 60 minutes.  CONTRAST:  57mL OMNIPAQUE IOHEXOL 300 MG/ML  SOLN  FLUOROSCOPY TIME:  5 minutes.  42 seconds.  COMPLICATIONS: None immediate  TECHNIQUE: Informed written consent was obtained from the patient after a discussion of the risks, benefits and alternatives to treatment. Questions regarding the procedure were encouraged and answered. A timeout was performed prior to the initiation of the procedure.  Survey ultrasound of the left upper extremity was performed. The brachial DVT was localized and a more peripheral patent segment of the brachial vein was identified for access. Overlying skin site was marked.  The site was prepped and draped in the usual sterile fashion, and a sterile drape was applied covering the operative field. Maximum barrier sterile technique with sterile gowns and gloves were used for the procedure. A timeout was performed prior to the initiation of the procedure. Local anesthesia was provided with 1% lidocaine.  Under real-time ultrasound guidance, access to the left brachial vein was achieved with a micropuncture set. Left upper extremity venography was performed. The dilator was exchanged over a Bentson for a 6 [place] vascular sheath, through which a 5 [place] Kumpe catheter was advanced to the SVC. This was exchanged for a Angiojet catheter, which allowed pulse  spray delivery of 10 mg tPA in100 mL sterile saline through the left upper extremity, subclavian and innominate vein clot. The Angiojet catheter would not advance across the stent into the SVC, so right IJ access was achieved. As before, the region was prepped and draped in usual sterile fashion and ultrasound-guided micropuncture access to the right IJ was achieved. Venography was performed. After the TPA had been allowed least 20 minutes post pulse spray, the Angiojet was again utilized in thrombectomy mode to extract the thrombus from the left upper extremity, subclavian and innominate veins. Residual occlusive thrombus in the central left subclavian and innominate vein was treated with additional passes of the Angiojet thrombectomy catheter. This was exchanged for a 10 mm Mustang balloon, advanced to the confluence of the left innominate vein and SVC for dilatation of a persistent stenosis. In similar fashion, the 10 mm balloon was advanced via the right IJ access for PTA of a proximal SVC stenosis. Final venography at both sites was performed. The catheters, guidewires and sheaths were removed and hemostasis achieved at the site with manual compression. The patient tolerated the procedure well.  FINDINGS: Left upper extremity venography demonstrated occlusive clot in the central aspect of the left brachial vein extending through the axillary vein, left subclavian vein and left innominate vein. There is tapered narrowing of the innominate vein near its confluence with the SVC. There was non opacification of the SVC. The left upper extremity, subclavian, and innominate thrombus was treated with mechanical thrombectomy after pulse spray of tPA as detailed above. No significant residual clot on final venography.  Right IJ venography demonstrated patency of the SVC. The stent extends from the distal right innominate vein to the SVC, covering the inflow of the left innominate vein. No collateral filling was  identified. There is mild narrowing of the stent at the confluence of the innominate veins.  The central left innominate vein stenosis responded well  to 10 mm balloon angioplasty. Follow-up venography showed significant improvement in diameter of the vein at its confluence with the SVC at the level of the sidewall stent. The proximal SVC stenosis responded readily to 10 mm balloon angioplasty and follow-up venography showed no extravasation, residual or recurrent stenosis.  IMPRESSION: 1. Extensive occlusive left upper extremity, subclavian, and innominate vein DVT, with successful TPA assisted mechanical thrombectomy. 2. The SVC stent remains patent. Non opacification on prior CT probably secondary to injection through the port catheter. 3. The SVC stent covers the inflow from the left innominate vein. Additionally, tapered narrowing of the central left innominate vein just proximal to its confluence with the SVC suggests involvement by the patient's known recurrent tumor. Although this region responded to 10 mm balloon angioplasty, she remains at risk for recurrence of thrombosis and should probably REMAIN ON LIFELONG ANTICOAGULATION if tolerated. 4. Mild proximal SVC stent stenosis, with good response to 10 mm balloon angioplasty.   Electronically Signed   By: Lucrezia Europe M.D.   On: 12/01/2014 12:54   Ir Pta Venous Right  12/01/2014   INDICATION: Right SVC clot and left arm brachial vein clot. 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 12 mm SVC stent placed 10/17/2014.  EXAM: THROMBOECTOMY MECHANICAL VENOUS; PTA VENOUS; LEFT EXTREMITY VENOGRAPHY; IR ULTRASOUND GUIDANCE VASC ACCESS RIGHT; IR ULTRASOUND GUIDANCE VASC ACCESS LEFT; SUPERIOR VENA CAVOGRAM  COMPARISON:  CT 11/30/2014 and earlier studies  MEDICATIONS: Intravenous Fentanyl and Versed were administered as conscious sedation during continuous cardiorespiratory monitoring by the radiology RN, with a total  moderate sedation time of 60 minutes.  CONTRAST:  36mL OMNIPAQUE IOHEXOL 300 MG/ML  SOLN  FLUOROSCOPY TIME:  5 minutes.  42 seconds.  COMPLICATIONS: None immediate  TECHNIQUE: Informed written consent was obtained from the patient after a discussion of the risks, benefits and alternatives to treatment. Questions regarding the procedure were encouraged and answered. A timeout was performed prior to the initiation of the procedure.  Survey ultrasound of the left upper extremity was performed. The brachial DVT was localized and a more peripheral patent segment of the brachial vein was identified for access. Overlying skin site was marked.  The site was prepped and draped in the usual sterile fashion, and a sterile drape was applied covering the operative field. Maximum barrier sterile technique with sterile gowns and gloves were used for the procedure. A timeout was performed prior to the initiation of the procedure. Local anesthesia was provided with 1% lidocaine.  Under real-time ultrasound guidance, access to the left brachial vein was achieved with a micropuncture set. Left upper extremity venography was performed. The dilator was exchanged over a Bentson for a 6 [place] vascular sheath, through which a 5 [place] Kumpe catheter was advanced to the SVC. This was exchanged for a Angiojet catheter, which allowed pulse spray delivery of 10 mg tPA in100 mL sterile saline through the left upper extremity, subclavian and innominate vein clot. The Angiojet catheter would not advance across the stent into the SVC, so right IJ access was achieved. As before, the region was prepped and draped in usual sterile fashion and ultrasound-guided micropuncture access to the right IJ was achieved. Venography was performed. After the TPA had been allowed least 20 minutes post pulse spray, the Angiojet was again utilized in thrombectomy mode to extract the thrombus from the left upper extremity, subclavian and innominate veins. Residual  occlusive thrombus in the central left subclavian and innominate  vein was treated with additional passes of the Angiojet thrombectomy catheter. This was exchanged for a 10 mm Mustang balloon, advanced to the confluence of the left innominate vein and SVC for dilatation of a persistent stenosis. In similar fashion, the 10 mm balloon was advanced via the right IJ access for PTA of a proximal SVC stenosis. Final venography at both sites was performed. The catheters, guidewires and sheaths were removed and hemostasis achieved at the site with manual compression. The patient tolerated the procedure well.  FINDINGS: Left upper extremity venography demonstrated occlusive clot in the central aspect of the left brachial vein extending through the axillary vein, left subclavian vein and left innominate vein. There is tapered narrowing of the innominate vein near its confluence with the SVC. There was non opacification of the SVC. The left upper extremity, subclavian, and innominate thrombus was treated with mechanical thrombectomy after pulse spray of tPA as detailed above. No significant residual clot on final venography.  Right IJ venography demonstrated patency of the SVC. The stent extends from the distal right innominate vein to the SVC, covering the inflow of the left innominate vein. No collateral filling was identified. There is mild narrowing of the stent at the confluence of the innominate veins.  The central left innominate vein stenosis responded well to 10 mm balloon angioplasty. Follow-up venography showed significant improvement in diameter of the vein at its confluence with the SVC at the level of the sidewall stent. The proximal SVC stenosis responded readily to 10 mm balloon angioplasty and follow-up venography showed no extravasation, residual or recurrent stenosis.  IMPRESSION: 1. Extensive occlusive left upper extremity, subclavian, and innominate vein DVT, with successful TPA assisted mechanical  thrombectomy. 2. The SVC stent remains patent. Non opacification on prior CT probably secondary to injection through the port catheter. 3. The SVC stent covers the inflow from the left innominate vein. Additionally, tapered narrowing of the central left innominate vein just proximal to its confluence with the SVC suggests involvement by the patient's known recurrent tumor. Although this region responded to 10 mm balloon angioplasty, she remains at risk for recurrence of thrombosis and should probably REMAIN ON LIFELONG ANTICOAGULATION if tolerated. 4. Mild proximal SVC stent stenosis, with good response to 10 mm balloon angioplasty.   Electronically Signed   By: Lucrezia Europe M.D.   On: 12/01/2014 12:54   Ir Thrombect Veno Mech Mod Sed  12/01/2014   INDICATION: Right SVC clot and left arm brachial vein clot. 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 12 mm SVC stent placed 10/17/2014.  EXAM: THROMBOECTOMY MECHANICAL VENOUS; PTA VENOUS; LEFT EXTREMITY VENOGRAPHY; IR ULTRASOUND GUIDANCE VASC ACCESS RIGHT; IR ULTRASOUND GUIDANCE VASC ACCESS LEFT; SUPERIOR VENA CAVOGRAM  COMPARISON:  CT 11/30/2014 and earlier studies  MEDICATIONS: Intravenous Fentanyl and Versed were administered as conscious sedation during continuous cardiorespiratory monitoring by the radiology RN, with a total moderate sedation time of 60 minutes.  CONTRAST:  20mL OMNIPAQUE IOHEXOL 300 MG/ML  SOLN  FLUOROSCOPY TIME:  5 minutes.  42 seconds.  COMPLICATIONS: None immediate  TECHNIQUE: Informed written consent was obtained from the patient after a discussion of the risks, benefits and alternatives to treatment. Questions regarding the procedure were encouraged and answered. A timeout was performed prior to the initiation of the procedure.  Survey ultrasound of the left upper extremity was performed. The brachial DVT was localized and a more peripheral patent segment of the brachial vein was identified for  access. Overlying skin site was marked.  The site was prepped and draped in the usual sterile fashion, and a sterile drape was applied covering the operative field. Maximum barrier sterile technique with sterile gowns and gloves were used for the procedure. A timeout was performed prior to the initiation of the procedure. Local anesthesia was provided with 1% lidocaine.  Under real-time ultrasound guidance, access to the left brachial vein was achieved with a micropuncture set. Left upper extremity venography was performed. The dilator was exchanged over a Bentson for a 6 [place] vascular sheath, through which a 5 [place] Kumpe catheter was advanced to the SVC. This was exchanged for a Angiojet catheter, which allowed pulse spray delivery of 10 mg tPA in100 mL sterile saline through the left upper extremity, subclavian and innominate vein clot. The Angiojet catheter would not advance across the stent into the SVC, so right IJ access was achieved. As before, the region was prepped and draped in usual sterile fashion and ultrasound-guided micropuncture access to the right IJ was achieved. Venography was performed. After the TPA had been allowed least 20 minutes post pulse spray, the Angiojet was again utilized in thrombectomy mode to extract the thrombus from the left upper extremity, subclavian and innominate veins. Residual occlusive thrombus in the central left subclavian and innominate vein was treated with additional passes of the Angiojet thrombectomy catheter. This was exchanged for a 10 mm Mustang balloon, advanced to the confluence of the left innominate vein and SVC for dilatation of a persistent stenosis. In similar fashion, the 10 mm balloon was advanced via the right IJ access for PTA of a proximal SVC stenosis. Final venography at both sites was performed. The catheters, guidewires and sheaths were removed and hemostasis achieved at the site with manual compression. The patient tolerated the procedure  well.  FINDINGS: Left upper extremity venography demonstrated occlusive clot in the central aspect of the left brachial vein extending through the axillary vein, left subclavian vein and left innominate vein. There is tapered narrowing of the innominate vein near its confluence with the SVC. There was non opacification of the SVC. The left upper extremity, subclavian, and innominate thrombus was treated with mechanical thrombectomy after pulse spray of tPA as detailed above. No significant residual clot on final venography.  Right IJ venography demonstrated patency of the SVC. The stent extends from the distal right innominate vein to the SVC, covering the inflow of the left innominate vein. No collateral filling was identified. There is mild narrowing of the stent at the confluence of the innominate veins.  The central left innominate vein stenosis responded well to 10 mm balloon angioplasty. Follow-up venography showed significant improvement in diameter of the vein at its confluence with the SVC at the level of the sidewall stent. The proximal SVC stenosis responded readily to 10 mm balloon angioplasty and follow-up venography showed no extravasation, residual or recurrent stenosis.  IMPRESSION: 1. Extensive occlusive left upper extremity, subclavian, and innominate vein DVT, with successful TPA assisted mechanical thrombectomy. 2. The SVC stent remains patent. Non opacification on prior CT probably secondary to injection through the port catheter. 3. The SVC stent covers the inflow from the left innominate vein. Additionally, tapered narrowing of the central left innominate vein just proximal to its confluence with the SVC suggests involvement by the patient's known recurrent tumor. Although this region responded to 10 mm balloon angioplasty, she remains at risk for recurrence of thrombosis and should probably REMAIN ON LIFELONG ANTICOAGULATION if tolerated. 4. Mild  proximal SVC stent stenosis, with good  response to 10 mm balloon angioplasty.   Electronically Signed   By: Lucrezia Europe M.D.   On: 12/01/2014 12:54   Ir US Guide Vasc Access Left  12/01/2014   INDICATION: Right SVC clot and left arm brachial vein clot. 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 12 mm SVC stent placed 10/17/2014.  EXAM: THROMBOECTOMY MECHANICAL VENOUS; PTA VENOUS; LEFT EXTREMITY VENOGRAPHY; IR ULTRASOUND GUIDANCE VASC ACCESS RIGHT; IR ULTRASOUND GUIDANCE VASC ACCESS LEFT; SUPERIOR VENA CAVOGRAM  COMPARISON:  CT 11/30/2014 and earlier studies  MEDICATIONS: Intravenous Fentanyl and Versed were administered as conscious sedation during continuous cardiorespiratory monitoring by the radiology RN, with a total moderate sedation time of 60 minutes.  CONTRAST:  71mL OMNIPAQUE IOHEXOL 300 MG/ML  SOLN  FLUOROSCOPY TIME:  5 minutes.  42 seconds.  COMPLICATIONS: None immediate  TECHNIQUE: Informed written consent was obtained from the patient after a discussion of the risks, benefits and alternatives to treatment. Questions regarding the procedure were encouraged and answered. A timeout was performed prior to the initiation of the procedure.  Survey ultrasound of the left upper extremity was performed. The brachial DVT was localized and a more peripheral patent segment of the brachial vein was identified for access. Overlying skin site was marked.  The site was prepped and draped in the usual sterile fashion, and a sterile drape was applied covering the operative field. Maximum barrier sterile technique with sterile gowns and gloves were used for the procedure. A timeout was performed prior to the initiation of the procedure. Local anesthesia was provided with 1% lidocaine.  Under real-time ultrasound guidance, access to the left brachial vein was achieved with a micropuncture set. Left upper extremity venography was performed. The dilator was exchanged over a Bentson for a 6 [place] vascular sheath,  through which a 5 [place] Kumpe catheter was advanced to the SVC. This was exchanged for a Angiojet catheter, which allowed pulse spray delivery of 10 mg tPA in100 mL sterile saline through the left upper extremity, subclavian and innominate vein clot. The Angiojet catheter would not advance across the stent into the SVC, so right IJ access was achieved. As before, the region was prepped and draped in usual sterile fashion and ultrasound-guided micropuncture access to the right IJ was achieved. Venography was performed. After the TPA had been allowed least 20 minutes post pulse spray, the Angiojet was again utilized in thrombectomy mode to extract the thrombus from the left upper extremity, subclavian and innominate veins. Residual occlusive thrombus in the central left subclavian and innominate vein was treated with additional passes of the Angiojet thrombectomy catheter. This was exchanged for a 10 mm Mustang balloon, advanced to the confluence of the left innominate vein and SVC for dilatation of a persistent stenosis. In similar fashion, the 10 mm balloon was advanced via the right IJ access for PTA of a proximal SVC stenosis. Final venography at both sites was performed. The catheters, guidewires and sheaths were removed and hemostasis achieved at the site with manual compression. The patient tolerated the procedure well.  FINDINGS: Left upper extremity venography demonstrated occlusive clot in the central aspect of the left brachial vein extending through the axillary vein, left subclavian vein and left innominate vein. There is tapered narrowing of the innominate vein near its confluence with the SVC. There was non opacification of the SVC. The left upper extremity, subclavian, and innominate thrombus was treated with mechanical  thrombectomy after pulse spray of tPA as detailed above. No significant residual clot on final venography.  Right IJ venography demonstrated patency of the SVC. The stent extends from  the distal right innominate vein to the SVC, covering the inflow of the left innominate vein. No collateral filling was identified. There is mild narrowing of the stent at the confluence of the innominate veins.  The central left innominate vein stenosis responded well to 10 mm balloon angioplasty. Follow-up venography showed significant improvement in diameter of the vein at its confluence with the SVC at the level of the sidewall stent. The proximal SVC stenosis responded readily to 10 mm balloon angioplasty and follow-up venography showed no extravasation, residual or recurrent stenosis.  IMPRESSION: 1. Extensive occlusive left upper extremity, subclavian, and innominate vein DVT, with successful TPA assisted mechanical thrombectomy. 2. The SVC stent remains patent. Non opacification on prior CT probably secondary to injection through the port catheter. 3. The SVC stent covers the inflow from the left innominate vein. Additionally, tapered narrowing of the central left innominate vein just proximal to its confluence with the SVC suggests involvement by the patient's known recurrent tumor. Although this region responded to 10 mm balloon angioplasty, she remains at risk for recurrence of thrombosis and should probably REMAIN ON LIFELONG ANTICOAGULATION if tolerated. 4. Mild proximal SVC stent stenosis, with good response to 10 mm balloon angioplasty.   Electronically Signed   By: Lucrezia Europe M.D.   On: 12/01/2014 12:54   Ir US Guide Vasc Access Right  12/01/2014   INDICATION: Right SVC clot and left arm brachial vein clot. 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 12 mm SVC stent placed 10/17/2014.  EXAM: THROMBOECTOMY MECHANICAL VENOUS; PTA VENOUS; LEFT EXTREMITY VENOGRAPHY; IR ULTRASOUND GUIDANCE VASC ACCESS RIGHT; IR ULTRASOUND GUIDANCE VASC ACCESS LEFT; SUPERIOR VENA CAVOGRAM  COMPARISON:  CT 11/30/2014 and earlier studies  MEDICATIONS: Intravenous Fentanyl and  Versed were administered as conscious sedation during continuous cardiorespiratory monitoring by the radiology RN, with a total moderate sedation time of 60 minutes.  CONTRAST:  34mL OMNIPAQUE IOHEXOL 300 MG/ML  SOLN  FLUOROSCOPY TIME:  5 minutes.  42 seconds.  COMPLICATIONS: None immediate  TECHNIQUE: Informed written consent was obtained from the patient after a discussion of the risks, benefits and alternatives to treatment. Questions regarding the procedure were encouraged and answered. A timeout was performed prior to the initiation of the procedure.  Survey ultrasound of the left upper extremity was performed. The brachial DVT was localized and a more peripheral patent segment of the brachial vein was identified for access. Overlying skin site was marked.  The site was prepped and draped in the usual sterile fashion, and a sterile drape was applied covering the operative field. Maximum barrier sterile technique with sterile gowns and gloves were used for the procedure. A timeout was performed prior to the initiation of the procedure. Local anesthesia was provided with 1% lidocaine.  Under real-time ultrasound guidance, access to the left brachial vein was achieved with a micropuncture set. Left upper extremity venography was performed. The dilator was exchanged over a Bentson for a 6 [place] vascular sheath, through which a 5 [place] Kumpe catheter was advanced to the SVC. This was exchanged for a Angiojet catheter, which allowed pulse spray delivery of 10 mg tPA in100 mL sterile saline through the left upper extremity, subclavian and innominate vein clot. The Angiojet catheter would not advance across the stent into the  SVC, so right IJ access was achieved. As before, the region was prepped and draped in usual sterile fashion and ultrasound-guided micropuncture access to the right IJ was achieved. Venography was performed. After the TPA had been allowed least 20 minutes post pulse spray, the Angiojet was again  utilized in thrombectomy mode to extract the thrombus from the left upper extremity, subclavian and innominate veins. Residual occlusive thrombus in the central left subclavian and innominate vein was treated with additional passes of the Angiojet thrombectomy catheter. This was exchanged for a 10 mm Mustang balloon, advanced to the confluence of the left innominate vein and SVC for dilatation of a persistent stenosis. In similar fashion, the 10 mm balloon was advanced via the right IJ access for PTA of a proximal SVC stenosis. Final venography at both sites was performed. The catheters, guidewires and sheaths were removed and hemostasis achieved at the site with manual compression. The patient tolerated the procedure well.  FINDINGS: Left upper extremity venography demonstrated occlusive clot in the central aspect of the left brachial vein extending through the axillary vein, left subclavian vein and left innominate vein. There is tapered narrowing of the innominate vein near its confluence with the SVC. There was non opacification of the SVC. The left upper extremity, subclavian, and innominate thrombus was treated with mechanical thrombectomy after pulse spray of tPA as detailed above. No significant residual clot on final venography.  Right IJ venography demonstrated patency of the SVC. The stent extends from the distal right innominate vein to the SVC, covering the inflow of the left innominate vein. No collateral filling was identified. There is mild narrowing of the stent at the confluence of the innominate veins.  The central left innominate vein stenosis responded well to 10 mm balloon angioplasty. Follow-up venography showed significant improvement in diameter of the vein at its confluence with the SVC at the level of the sidewall stent. The proximal SVC stenosis responded readily to 10 mm balloon angioplasty and follow-up venography showed no extravasation, residual or recurrent stenosis.  IMPRESSION: 1.  Extensive occlusive left upper extremity, subclavian, and innominate vein DVT, with successful TPA assisted mechanical thrombectomy. 2. The SVC stent remains patent. Non opacification on prior CT probably secondary to injection through the port catheter. 3. The SVC stent covers the inflow from the left innominate vein. Additionally, tapered narrowing of the central left innominate vein just proximal to its confluence with the SVC suggests involvement by the patient's known recurrent tumor. Although this region responded to 10 mm balloon angioplasty, she remains at risk for recurrence of thrombosis and should probably REMAIN ON LIFELONG ANTICOAGULATION if tolerated. 4. Mild proximal SVC stent stenosis, with good response to 10 mm balloon angioplasty.   Electronically Signed   By: Lucrezia Europe M.D.   On: 12/01/2014 12:54   Dg Chest Port 1 View  11/30/2014   CLINICAL DATA:  Lung cancer with bilateral upper extremity edema  EXAM: PORTABLE CHEST - 1 VIEW  COMPARISON:  Chest radiograph October 09, 2014 and chest CT November 03, 2014  FINDINGS: The previously noted lung carcinoma in the right upper lobe is again present measuring approximately 4.2 x 3.4 cm. Is tumor appears to extend into the right hilum, unchanged. Lungs elsewhere clear. Heart size and pulmonary vascularity are normal. No adenopathy. Port-A-Cath tip is in the superior vena cava. No pneumothorax. There is a stent in the superior vena cava region, stable.  IMPRESSION: Persistent right upper lobe mass extending in the right hilum. Stent in  superior vena cava. No new opacity. No change in cardiac silhouette.   Electronically Signed   By: Lowella Grip III M.D.   On: 11/30/2014 15:49     CBC  Recent Labs Lab 11/28/14 1320  12/01/14 0428 12/02/14 0447 12/03/14 0042 12/03/14 0754 12/04/14 0430 12/05/14 0500  WBC 5.4  < > 4.4 5.2 5.4  --  4.5 2.1*  HGB 11.1*  < > 10.2* 9.5* 8.6* 8.9* 8.3* 8.3*  HCT 34.6*  < > 32.0* 29.5* 26.5* 27.7* 26.0*  25.4*  PLT 207  < > 209 181 181  --  169 117*  MCV 83.6  < > 84.2 81.7 82.6  --  81.8 82.7  MCH 26.8  < > 26.8 26.3 26.8  --  26.1 27.0  MCHC 32.1  < > 31.9 32.2 32.5  --  31.9 32.7  RDW 16.4*  < > 16.7* 16.5* 16.4*  --  16.8* 16.6*  LYMPHSABS 1.1  --   --   --   --   --   --   --   MONOABS 0.9  --   --   --   --   --   --   --   EOSABS 0.1  --   --   --   --   --   --   --   BASOSABS 0.1  --   --   --   --   --   --   --   < > = values in this interval not displayed.  Chemistries   Recent Labs Lab 11/28/14 1320 11/30/14 1450 12/02/14 0447 12/05/14 0500  NA 140 137 135 137  K 3.8 4.0 3.7 3.9  CL  --  102 97 101  CO2 22 26 30 30   GLUCOSE 219* 178* 173* 141*  BUN 5.6* 7 <5* <5*  CREATININE 0.8 0.59 0.59 0.53  CALCIUM 8.3* 8.1* 7.9* 8.2*  AST 20  --   --   --   ALT 18  --   --   --   ALKPHOS 80  --   --   --   BILITOT 0.23  --   --   --    ------------------------------------------------------------------------------------------------------------------ estimated creatinine clearance is 80.8 mL/min (by C-G formula based on Cr of 0.53). ------------------------------------------------------------------------------------------------------------------ No results for input(s): HGBA1C in the last 72 hours. ------------------------------------------------------------------------------------------------------------------ No results for input(s): CHOL, HDL, LDLCALC, TRIG, CHOLHDL, LDLDIRECT in the last 72 hours. ------------------------------------------------------------------------------------------------------------------ No results for input(s): TSH, T4TOTAL, T3FREE, THYROIDAB in the last 72 hours.  Invalid input(s): FREET3 ------------------------------------------------------------------------------------------------------------------ No results for input(s): VITAMINB12, FOLATE, FERRITIN, TIBC, IRON, RETICCTPCT in the last 72 hours.  Coagulation profile  Recent Labs Lab  11/30/14 1444  INR 1.14    No results for input(s): DDIMER in the last 72 hours.  Cardiac Enzymes No results for input(s): CKMB, TROPONINI, MYOGLOBIN in the last 168 hours.  Invalid input(s): CK ------------------------------------------------------------------------------------------------------------------ Invalid input(s): POCBNP     Time Spent in minutes  35   SINGH,PRASHANT K M.D on 12/05/2014 at 12:42 PM  Between 7am to 7pm - Pager - [phone]  After 7pm go to [email or web address] - [facility] Office  [phone]

## 2014-12-05 NOTE — Telephone Encounter (Signed)
pt called to advised that she is in the hospital....cx todays appts....emailed MM to see he pt sched needs to be adjusted.

## 2014-12-05 NOTE — Progress Notes (Signed)
Pt has been sitting up in the chair today for a few hours and has also ambulated in the hallway today and tolerated well.  Will continue to monitor. Janice Ball

## 2014-12-05 NOTE — Telephone Encounter (Signed)
sw. pt and advised that MM responded for pt to call once she gets out of hospital and he will decide if he wants to r/s

## 2014-12-05 NOTE — Progress Notes (Signed)
Inpatient Diabetes Program Recommendations  AACE/ADA: New Consensus Statement on Inpatient Glycemic Control (2013)  Target Ranges:  Prepandial:   less than 140 mg/dL      Peak postprandial:   less than 180 mg/dL (1-2 hours)      Critically ill patients:  140 - 180 mg/dL   Reason for Assessment:  Results for Janice Ball, Janice Ball (MRN [id number]) as of 12/05/2014 10:35  Ref. Range 12/04/2014 06:02 12/04/2014 10:50 12/04/2014 17:08 12/04/2014 21:07 12/05/2014 06:23  Glucose-Capillary Latest Range: 70-99 mg/dL 164 (H) 208 (H) 167 (H) 290 (H) 142 (H)   Diabetes history: Type 2 diabetes Outpatient Diabetes medications: Amaryl 2 mg daily, Metformin 1000 mg bid,  Current orders for Inpatient glycemic control:  Novolog sensitive tid with meals and HS, Amaryl 2 mg daily  May consider adding Novolog 3 units tid with meals (Hold if patient eats less than 50%).  Thanks, Adah Perl, RN, BC-ADM Inpatient Diabetes Coordinator Pager [phone]

## 2014-12-05 NOTE — Progress Notes (Signed)
Referring Physician(s): TRH  Subjective:  LUE thrombectomy and lysis with pta of innominate vein with good result in IR 12/01/14 Left arm redness Redness may be slightly less today Still tender to touch [place] Neg for DVT or superficial thrombus  Allergies: Sulfonamide derivatives; Codeine; Dilaudid; Penicillins; and Vicodin  Medications: Prior to Admission medications   Medication Sig Start Date End Date Taking? Authorizing Provider  acetaminophen (TYLENOL) 325 MG tablet Take 650 mg by mouth every 6 (six) hours as needed for mild pain or headache.    Yes Historical Provider, MD  albuterol (PROVENTIL HFA;VENTOLIN HFA) 108 (90 BASE) MCG/ACT inhaler Inhale 1 puff into the lungs every 6 (six) hours as needed for wheezing or shortness of breath.   Yes Historical Provider, MD  alendronate (FOSAMAX) 10 MG tablet Take 10 mg by mouth daily.  03/20/14  Yes Historical Provider, MD  aspirin 325 MG tablet Take 325 mg by mouth daily with breakfast.    Yes Historical Provider, MD  budesonide-formoterol (SYMBICORT) 160-4.5 MCG/ACT inhaler Inhale 2 puffs into the lungs 2 (two) times daily.   Yes Historical Provider, MD  cholecalciferol (VITAMIN D) 1000 UNITS tablet Take 1,000 Units by mouth every morning.    Yes Historical Provider, MD  cyanocobalamin 1000 MCG tablet Take 1,000 mcg by mouth every morning.    Yes Historical Provider, MD  glimepiride (AMARYL) 2 MG tablet Take 2 mg by mouth daily with breakfast.  12/21/13  Yes Historical Provider, MD  guaiFENesin (MUCINEX) 600 MG 12 hr tablet Take 1,200 mg by mouth 2 (two) times daily.   Yes Historical Provider, MD  ipratropium (ATROVENT) 0.02 % nebulizer solution Take 2.5 mLs (0.5 mg total) by nebulization 4 (four) times daily. 03/05/14  Yes Brand Males, MD  levothyroxine (SYNTHROID, LEVOTHROID) 125 MCG tablet Take 125 mcg by mouth daily before breakfast.    Yes Historical Provider, MD  lidocaine-prilocaine (EMLA) cream Apply 1 application topically as  needed. Apply to port 1 hr before chemo 11/06/14  Yes Curt Bears, MD  LORazepam (ATIVAN) 1 MG tablet Take 1 mg by mouth 3 (three) times daily as needed for anxiety.    Yes Historical Provider, MD  magnesium gluconate (MAGONATE) 500 MG tablet Take 500 mg by mouth every morning.    Yes Historical Provider, MD  metFORMIN (GLUCOPHAGE) 1000 MG tablet Take 1,000 mg by mouth 2 (two) times daily with a meal.   Yes Historical Provider, MD  Multiple Vitamin (MULTIVITAMIN WITH MINERALS) TABS tablet Take 1 tablet by mouth every morning.   Yes Historical Provider, MD  OVER THE COUNTER MEDICATION Take 1 capsule by mouth daily. MSM. For energy and joint pain   Yes Historical Provider, MD  oxyCODONE (OXY IR/ROXICODONE) 5 MG immediate release tablet Take 1 tablet (5 mg total) by mouth every 4 (four) hours as needed for moderate pain. 11/28/14  Yes Adrena E Johnson, PA-C  PROAIR HFA 108 (90 BASE) MCG/ACT inhaler INHALE 2 PUFFS INTO THE LUNGS EVERY 6 (SIX) HOURS AS NEEDED FOR WHEEZING OR SHORTNESS OF BREATH. 10/08/14  Yes Brand Males, MD  prochlorperazine (COMPAZINE) 10 MG tablet Take 1 tablet (10 mg total) by mouth every 6 (six) hours as needed for nausea or vomiting. 11/28/14  Yes Adrena E Johnson, PA-C  sertraline (ZOLOFT) 100 MG tablet Take 150 mg by mouth at bedtime.    Yes Historical Provider, MD  spironolactone (ALDACTONE) 25 MG tablet Take 25 mg by mouth every morning.    Yes Historical Provider, MD  docusate sodium 100 MG CAPS Take 100 mg by mouth 2 (two) times daily. Patient not taking: Reported on 11/28/2014 11/05/14   Reyne Dumas, MD  enoxaparin (LOVENOX) 120 MG/0.8ML injection Inject 0.8 mLs (120 mg total) into the skin daily. 12/03/14   Thurnell Lose, MD     Vital Signs: BP 113/65 mmHg  Pulse 101  Temp(Src) 97.5 F (36.4 C) (Oral)  Resp 18  Ht 5\' 7"  (1.702 m)  Wt 78.744 kg (173 lb 9.6 oz)  BMI 27.18 kg/m2  SpO2 98%  Physical Exam  Musculoskeletal:  Left arm still has redness to skin  from forearm to upper arm. Tender to touch Skin intact No blisters Minimal swelling 2+ pulses at wrist FROM of fingers     Imaging: Ir Veno/ext/uni Left  12/01/2014   INDICATION: Right SVC clot and left arm brachial vein clot. 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 12 mm SVC stent placed 10/17/2014.  EXAM: THROMBOECTOMY MECHANICAL VENOUS; PTA VENOUS; LEFT EXTREMITY VENOGRAPHY; IR ULTRASOUND GUIDANCE VASC ACCESS RIGHT; IR ULTRASOUND GUIDANCE VASC ACCESS LEFT; SUPERIOR VENA CAVOGRAM  COMPARISON:  CT 11/30/2014 and earlier studies  MEDICATIONS: Intravenous Fentanyl and Versed were administered as conscious sedation during continuous cardiorespiratory monitoring by the radiology RN, with a total moderate sedation time of 60 minutes.  CONTRAST:  64mL OMNIPAQUE IOHEXOL 300 MG/ML  SOLN  FLUOROSCOPY TIME:  5 minutes.  42 seconds.  COMPLICATIONS: None immediate  TECHNIQUE: Informed written consent was obtained from the patient after a discussion of the risks, benefits and alternatives to treatment. Questions regarding the procedure were encouraged and answered. A timeout was performed prior to the initiation of the procedure.  Survey ultrasound of the left upper extremity was performed. The brachial DVT was localized and a more peripheral patent segment of the brachial vein was identified for access. Overlying skin site was marked.  The site was prepped and draped in the usual sterile fashion, and a sterile drape was applied covering the operative field. Maximum barrier sterile technique with sterile gowns and gloves were used for the procedure. A timeout was performed prior to the initiation of the procedure. Local anesthesia was provided with 1% lidocaine.  Under real-time ultrasound guidance, access to the left brachial vein was achieved with a micropuncture set. Left upper extremity venography was performed. The dilator was exchanged over a Bentson for a 6  [place] vascular sheath, through which a 5 [place] Kumpe catheter was advanced to the SVC. This was exchanged for a Angiojet catheter, which allowed pulse spray delivery of 10 mg tPA in100 mL sterile saline through the left upper extremity, subclavian and innominate vein clot. The Angiojet catheter would not advance across the stent into the SVC, so right IJ access was achieved. As before, the region was prepped and draped in usual sterile fashion and ultrasound-guided micropuncture access to the right IJ was achieved. Venography was performed. After the TPA had been allowed least 20 minutes post pulse spray, the Angiojet was again utilized in thrombectomy mode to extract the thrombus from the left upper extremity, subclavian and innominate veins. Residual occlusive thrombus in the central left subclavian and innominate vein was treated with additional passes of the Angiojet thrombectomy catheter. This was exchanged for a 10 mm Mustang balloon, advanced to the confluence of the left innominate vein and SVC for dilatation of a persistent stenosis. In similar fashion, the 10 mm balloon was advanced via the right IJ access for  PTA of a proximal SVC stenosis. Final venography at both sites was performed. The catheters, guidewires and sheaths were removed and hemostasis achieved at the site with manual compression. The patient tolerated the procedure well.  FINDINGS: Left upper extremity venography demonstrated occlusive clot in the central aspect of the left brachial vein extending through the axillary vein, left subclavian vein and left innominate vein. There is tapered narrowing of the innominate vein near its confluence with the SVC. There was non opacification of the SVC. The left upper extremity, subclavian, and innominate thrombus was treated with mechanical thrombectomy after pulse spray of tPA as detailed above. No significant residual clot on final venography.  Right IJ venography demonstrated patency of the SVC.  The stent extends from the distal right innominate vein to the SVC, covering the inflow of the left innominate vein. No collateral filling was identified. There is mild narrowing of the stent at the confluence of the innominate veins.  The central left innominate vein stenosis responded well to 10 mm balloon angioplasty. Follow-up venography showed significant improvement in diameter of the vein at its confluence with the SVC at the level of the sidewall stent. The proximal SVC stenosis responded readily to 10 mm balloon angioplasty and follow-up venography showed no extravasation, residual or recurrent stenosis.  IMPRESSION: 1. Extensive occlusive left upper extremity, subclavian, and innominate vein DVT, with successful TPA assisted mechanical thrombectomy. 2. The SVC stent remains patent. Non opacification on prior CT probably secondary to injection through the port catheter. 3. The SVC stent covers the inflow from the left innominate vein. Additionally, tapered narrowing of the central left innominate vein just proximal to its confluence with the SVC suggests involvement by the patient's known recurrent tumor. Although this region responded to 10 mm balloon angioplasty, she remains at risk for recurrence of thrombosis and should probably REMAIN ON LIFELONG ANTICOAGULATION if tolerated. 4. Mild proximal SVC stent stenosis, with good response to 10 mm balloon angioplasty.   Electronically Signed   By: Lucrezia Europe M.D.   On: 12/01/2014 12:54   Ir Venocavagram Svc  12/01/2014   INDICATION: Right SVC clot and left arm brachial vein clot. 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 12 mm SVC stent placed 10/17/2014.  EXAM: THROMBOECTOMY MECHANICAL VENOUS; PTA VENOUS; LEFT EXTREMITY VENOGRAPHY; IR ULTRASOUND GUIDANCE VASC ACCESS RIGHT; IR ULTRASOUND GUIDANCE VASC ACCESS LEFT; SUPERIOR VENA CAVOGRAM  COMPARISON:  CT 11/30/2014 and earlier studies  MEDICATIONS: Intravenous  Fentanyl and Versed were administered as conscious sedation during continuous cardiorespiratory monitoring by the radiology RN, with a total moderate sedation time of 60 minutes.  CONTRAST:  3mL OMNIPAQUE IOHEXOL 300 MG/ML  SOLN  FLUOROSCOPY TIME:  5 minutes.  42 seconds.  COMPLICATIONS: None immediate  TECHNIQUE: Informed written consent was obtained from the patient after a discussion of the risks, benefits and alternatives to treatment. Questions regarding the procedure were encouraged and answered. A timeout was performed prior to the initiation of the procedure.  Survey ultrasound of the left upper extremity was performed. The brachial DVT was localized and a more peripheral patent segment of the brachial vein was identified for access. Overlying skin site was marked.  The site was prepped and draped in the usual sterile fashion, and a sterile drape was applied covering the operative field. Maximum barrier sterile technique with sterile gowns and gloves were used for the procedure. A timeout was performed prior to the initiation of the procedure. Local  anesthesia was provided with 1% lidocaine.  Under real-time ultrasound guidance, access to the left brachial vein was achieved with a micropuncture set. Left upper extremity venography was performed. The dilator was exchanged over a Bentson for a 6 [place] vascular sheath, through which a 5 [place] Kumpe catheter was advanced to the SVC. This was exchanged for a Angiojet catheter, which allowed pulse spray delivery of 10 mg tPA in100 mL sterile saline through the left upper extremity, subclavian and innominate vein clot. The Angiojet catheter would not advance across the stent into the SVC, so right IJ access was achieved. As before, the region was prepped and draped in usual sterile fashion and ultrasound-guided micropuncture access to the right IJ was achieved. Venography was performed. After the TPA had been allowed least 20 minutes post pulse spray, the  Angiojet was again utilized in thrombectomy mode to extract the thrombus from the left upper extremity, subclavian and innominate veins. Residual occlusive thrombus in the central left subclavian and innominate vein was treated with additional passes of the Angiojet thrombectomy catheter. This was exchanged for a 10 mm Mustang balloon, advanced to the confluence of the left innominate vein and SVC for dilatation of a persistent stenosis. In similar fashion, the 10 mm balloon was advanced via the right IJ access for PTA of a proximal SVC stenosis. Final venography at both sites was performed. The catheters, guidewires and sheaths were removed and hemostasis achieved at the site with manual compression. The patient tolerated the procedure well.  FINDINGS: Left upper extremity venography demonstrated occlusive clot in the central aspect of the left brachial vein extending through the axillary vein, left subclavian vein and left innominate vein. There is tapered narrowing of the innominate vein near its confluence with the SVC. There was non opacification of the SVC. The left upper extremity, subclavian, and innominate thrombus was treated with mechanical thrombectomy after pulse spray of tPA as detailed above. No significant residual clot on final venography.  Right IJ venography demonstrated patency of the SVC. The stent extends from the distal right innominate vein to the SVC, covering the inflow of the left innominate vein. No collateral filling was identified. There is mild narrowing of the stent at the confluence of the innominate veins.  The central left innominate vein stenosis responded well to 10 mm balloon angioplasty. Follow-up venography showed significant improvement in diameter of the vein at its confluence with the SVC at the level of the sidewall stent. The proximal SVC stenosis responded readily to 10 mm balloon angioplasty and follow-up venography showed no extravasation, residual or recurrent  stenosis.  IMPRESSION: 1. Extensive occlusive left upper extremity, subclavian, and innominate vein DVT, with successful TPA assisted mechanical thrombectomy. 2. The SVC stent remains patent. Non opacification on prior CT probably secondary to injection through the port catheter. 3. The SVC stent covers the inflow from the left innominate vein. Additionally, tapered narrowing of the central left innominate vein just proximal to its confluence with the SVC suggests involvement by the patient's known recurrent tumor. Although this region responded to 10 mm balloon angioplasty, she remains at risk for recurrence of thrombosis and should probably REMAIN ON LIFELONG ANTICOAGULATION if tolerated. 4. Mild proximal SVC stent stenosis, with good response to 10 mm balloon angioplasty.   Electronically Signed   By: Lucrezia Europe M.D.   On: 12/01/2014 12:54   [place] Extrem Up Left Ltd  12/04/2014   CLINICAL DATA:  Evaluate cellulitis left antecubital fossa, clot removed 12/01/2014, redness and swelling  left arm  EXAM: ULTRASOUND LEFT UPPER EXTREMITY LIMITED  TECHNIQUE: Ultrasound examination of the upper extremity soft tissues was performed in the area of clinical concern.  COMPARISON:  None  FINDINGS: There is severe edematous change in the soft tissues. There is no focal fluid collection.  IMPRESSION: Findings consistent with severe cellulitis.  No abscess identified.   Electronically Signed   By: Skipper Cliche M.D.   On: 12/04/2014 17:07   Ir Pta Venous Left  12/01/2014   INDICATION: Right SVC clot and left arm brachial vein clot. 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 12 mm SVC stent placed 10/17/2014.  EXAM: THROMBOECTOMY MECHANICAL VENOUS; PTA VENOUS; LEFT EXTREMITY VENOGRAPHY; IR ULTRASOUND GUIDANCE VASC ACCESS RIGHT; IR ULTRASOUND GUIDANCE VASC ACCESS LEFT; SUPERIOR VENA CAVOGRAM  COMPARISON:  CT 11/30/2014 and earlier studies  MEDICATIONS: Intravenous Fentanyl and  Versed were administered as conscious sedation during continuous cardiorespiratory monitoring by the radiology RN, with a total moderate sedation time of 60 minutes.  CONTRAST:  20mL OMNIPAQUE IOHEXOL 300 MG/ML  SOLN  FLUOROSCOPY TIME:  5 minutes.  42 seconds.  COMPLICATIONS: None immediate  TECHNIQUE: Informed written consent was obtained from the patient after a discussion of the risks, benefits and alternatives to treatment. Questions regarding the procedure were encouraged and answered. A timeout was performed prior to the initiation of the procedure.  Survey ultrasound of the left upper extremity was performed. The brachial DVT was localized and a more peripheral patent segment of the brachial vein was identified for access. Overlying skin site was marked.  The site was prepped and draped in the usual sterile fashion, and a sterile drape was applied covering the operative field. Maximum barrier sterile technique with sterile gowns and gloves were used for the procedure. A timeout was performed prior to the initiation of the procedure. Local anesthesia was provided with 1% lidocaine.  Under real-time ultrasound guidance, access to the left brachial vein was achieved with a micropuncture set. Left upper extremity venography was performed. The dilator was exchanged over a Bentson for a 6 [place] vascular sheath, through which a 5 [place] Kumpe catheter was advanced to the SVC. This was exchanged for a Angiojet catheter, which allowed pulse spray delivery of 10 mg tPA in100 mL sterile saline through the left upper extremity, subclavian and innominate vein clot. The Angiojet catheter would not advance across the stent into the SVC, so right IJ access was achieved. As before, the region was prepped and draped in usual sterile fashion and ultrasound-guided micropuncture access to the right IJ was achieved. Venography was performed. After the TPA had been allowed least 20 minutes post pulse spray, the Angiojet was again  utilized in thrombectomy mode to extract the thrombus from the left upper extremity, subclavian and innominate veins. Residual occlusive thrombus in the central left subclavian and innominate vein was treated with additional passes of the Angiojet thrombectomy catheter. This was exchanged for a 10 mm Mustang balloon, advanced to the confluence of the left innominate vein and SVC for dilatation of a persistent stenosis. In similar fashion, the 10 mm balloon was advanced via the right IJ access for PTA of a proximal SVC stenosis. Final venography at both sites was performed. The catheters, guidewires and sheaths were removed and hemostasis achieved at the site with manual compression. The patient tolerated the procedure well.  FINDINGS: Left upper extremity venography demonstrated occlusive clot in the central aspect of the left brachial vein  extending through the axillary vein, left subclavian vein and left innominate vein. There is tapered narrowing of the innominate vein near its confluence with the SVC. There was non opacification of the SVC. The left upper extremity, subclavian, and innominate thrombus was treated with mechanical thrombectomy after pulse spray of tPA as detailed above. No significant residual clot on final venography.  Right IJ venography demonstrated patency of the SVC. The stent extends from the distal right innominate vein to the SVC, covering the inflow of the left innominate vein. No collateral filling was identified. There is mild narrowing of the stent at the confluence of the innominate veins.  The central left innominate vein stenosis responded well to 10 mm balloon angioplasty. Follow-up venography showed significant improvement in diameter of the vein at its confluence with the SVC at the level of the sidewall stent. The proximal SVC stenosis responded readily to 10 mm balloon angioplasty and follow-up venography showed no extravasation, residual or recurrent stenosis.  IMPRESSION: 1.  Extensive occlusive left upper extremity, subclavian, and innominate vein DVT, with successful TPA assisted mechanical thrombectomy. 2. The SVC stent remains patent. Non opacification on prior CT probably secondary to injection through the port catheter. 3. The SVC stent covers the inflow from the left innominate vein. Additionally, tapered narrowing of the central left innominate vein just proximal to its confluence with the SVC suggests involvement by the patient's known recurrent tumor. Although this region responded to 10 mm balloon angioplasty, she remains at risk for recurrence of thrombosis and should probably REMAIN ON LIFELONG ANTICOAGULATION if tolerated. 4. Mild proximal SVC stent stenosis, with good response to 10 mm balloon angioplasty.   Electronically Signed   By: Lucrezia Europe M.D.   On: 12/01/2014 12:54   Ir Pta Venous Right  12/01/2014   INDICATION: Right SVC clot and left arm brachial vein clot. 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 12 mm SVC stent placed 10/17/2014.  EXAM: THROMBOECTOMY MECHANICAL VENOUS; PTA VENOUS; LEFT EXTREMITY VENOGRAPHY; IR ULTRASOUND GUIDANCE VASC ACCESS RIGHT; IR ULTRASOUND GUIDANCE VASC ACCESS LEFT; SUPERIOR VENA CAVOGRAM  COMPARISON:  CT 11/30/2014 and earlier studies  MEDICATIONS: Intravenous Fentanyl and Versed were administered as conscious sedation during continuous cardiorespiratory monitoring by the radiology RN, with a total moderate sedation time of 60 minutes.  CONTRAST:  37mL OMNIPAQUE IOHEXOL 300 MG/ML  SOLN  FLUOROSCOPY TIME:  5 minutes.  42 seconds.  COMPLICATIONS: None immediate  TECHNIQUE: Informed written consent was obtained from the patient after a discussion of the risks, benefits and alternatives to treatment. Questions regarding the procedure were encouraged and answered. A timeout was performed prior to the initiation of the procedure.  Survey ultrasound of the left upper extremity was performed.  The brachial DVT was localized and a more peripheral patent segment of the brachial vein was identified for access. Overlying skin site was marked.  The site was prepped and draped in the usual sterile fashion, and a sterile drape was applied covering the operative field. Maximum barrier sterile technique with sterile gowns and gloves were used for the procedure. A timeout was performed prior to the initiation of the procedure. Local anesthesia was provided with 1% lidocaine.  Under real-time ultrasound guidance, access to the left brachial vein was achieved with a micropuncture set. Left upper extremity venography was performed. The dilator was exchanged over a Bentson for a 6 [place] vascular sheath, through which a 5 [place] Kumpe catheter was advanced to  the SVC. This was exchanged for a Angiojet catheter, which allowed pulse spray delivery of 10 mg tPA in100 mL sterile saline through the left upper extremity, subclavian and innominate vein clot. The Angiojet catheter would not advance across the stent into the SVC, so right IJ access was achieved. As before, the region was prepped and draped in usual sterile fashion and ultrasound-guided micropuncture access to the right IJ was achieved. Venography was performed. After the TPA had been allowed least 20 minutes post pulse spray, the Angiojet was again utilized in thrombectomy mode to extract the thrombus from the left upper extremity, subclavian and innominate veins. Residual occlusive thrombus in the central left subclavian and innominate vein was treated with additional passes of the Angiojet thrombectomy catheter. This was exchanged for a 10 mm Mustang balloon, advanced to the confluence of the left innominate vein and SVC for dilatation of a persistent stenosis. In similar fashion, the 10 mm balloon was advanced via the right IJ access for PTA of a proximal SVC stenosis. Final venography at both sites was performed. The catheters, guidewires and sheaths were  removed and hemostasis achieved at the site with manual compression. The patient tolerated the procedure well.  FINDINGS: Left upper extremity venography demonstrated occlusive clot in the central aspect of the left brachial vein extending through the axillary vein, left subclavian vein and left innominate vein. There is tapered narrowing of the innominate vein near its confluence with the SVC. There was non opacification of the SVC. The left upper extremity, subclavian, and innominate thrombus was treated with mechanical thrombectomy after pulse spray of tPA as detailed above. No significant residual clot on final venography.  Right IJ venography demonstrated patency of the SVC. The stent extends from the distal right innominate vein to the SVC, covering the inflow of the left innominate vein. No collateral filling was identified. There is mild narrowing of the stent at the confluence of the innominate veins.  The central left innominate vein stenosis responded well to 10 mm balloon angioplasty. Follow-up venography showed significant improvement in diameter of the vein at its confluence with the SVC at the level of the sidewall stent. The proximal SVC stenosis responded readily to 10 mm balloon angioplasty and follow-up venography showed no extravasation, residual or recurrent stenosis.  IMPRESSION: 1. Extensive occlusive left upper extremity, subclavian, and innominate vein DVT, with successful TPA assisted mechanical thrombectomy. 2. The SVC stent remains patent. Non opacification on prior CT probably secondary to injection through the port catheter. 3. The SVC stent covers the inflow from the left innominate vein. Additionally, tapered narrowing of the central left innominate vein just proximal to its confluence with the SVC suggests involvement by the patient's known recurrent tumor. Although this region responded to 10 mm balloon angioplasty, she remains at risk for recurrence of thrombosis and should  probably REMAIN ON LIFELONG ANTICOAGULATION if tolerated. 4. Mild proximal SVC stent stenosis, with good response to 10 mm balloon angioplasty.   Electronically Signed   By: Lucrezia Europe M.D.   On: 12/01/2014 12:54   Ir Thrombect Veno Mech Mod Sed  12/01/2014   INDICATION: Right SVC clot and left arm brachial vein clot. 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 12 mm SVC stent placed 10/17/2014.  EXAM: THROMBOECTOMY MECHANICAL VENOUS; PTA VENOUS; LEFT EXTREMITY VENOGRAPHY; IR ULTRASOUND GUIDANCE VASC ACCESS RIGHT; IR ULTRASOUND GUIDANCE VASC ACCESS LEFT; SUPERIOR VENA CAVOGRAM  COMPARISON:  CT 11/30/2014 and earlier studies  MEDICATIONS: Intravenous Fentanyl and Versed were administered as conscious sedation during continuous cardiorespiratory monitoring by the radiology RN, with a total moderate sedation time of 60 minutes.  CONTRAST:  17mL OMNIPAQUE IOHEXOL 300 MG/ML  SOLN  FLUOROSCOPY TIME:  5 minutes.  42 seconds.  COMPLICATIONS: None immediate  TECHNIQUE: Informed written consent was obtained from the patient after a discussion of the risks, benefits and alternatives to treatment. Questions regarding the procedure were encouraged and answered. A timeout was performed prior to the initiation of the procedure.  Survey ultrasound of the left upper extremity was performed. The brachial DVT was localized and a more peripheral patent segment of the brachial vein was identified for access. Overlying skin site was marked.  The site was prepped and draped in the usual sterile fashion, and a sterile drape was applied covering the operative field. Maximum barrier sterile technique with sterile gowns and gloves were used for the procedure. A timeout was performed prior to the initiation of the procedure. Local anesthesia was provided with 1% lidocaine.  Under real-time ultrasound guidance, access to the left brachial vein was achieved with a micropuncture set. Left upper  extremity venography was performed. The dilator was exchanged over a Bentson for a 6 [place] vascular sheath, through which a 5 [place] Kumpe catheter was advanced to the SVC. This was exchanged for a Angiojet catheter, which allowed pulse spray delivery of 10 mg tPA in100 mL sterile saline through the left upper extremity, subclavian and innominate vein clot. The Angiojet catheter would not advance across the stent into the SVC, so right IJ access was achieved. As before, the region was prepped and draped in usual sterile fashion and ultrasound-guided micropuncture access to the right IJ was achieved. Venography was performed. After the TPA had been allowed least 20 minutes post pulse spray, the Angiojet was again utilized in thrombectomy mode to extract the thrombus from the left upper extremity, subclavian and innominate veins. Residual occlusive thrombus in the central left subclavian and innominate vein was treated with additional passes of the Angiojet thrombectomy catheter. This was exchanged for a 10 mm Mustang balloon, advanced to the confluence of the left innominate vein and SVC for dilatation of a persistent stenosis. In similar fashion, the 10 mm balloon was advanced via the right IJ access for PTA of a proximal SVC stenosis. Final venography at both sites was performed. The catheters, guidewires and sheaths were removed and hemostasis achieved at the site with manual compression. The patient tolerated the procedure well.  FINDINGS: Left upper extremity venography demonstrated occlusive clot in the central aspect of the left brachial vein extending through the axillary vein, left subclavian vein and left innominate vein. There is tapered narrowing of the innominate vein near its confluence with the SVC. There was non opacification of the SVC. The left upper extremity, subclavian, and innominate thrombus was treated with mechanical thrombectomy after pulse spray of tPA as detailed above. No significant  residual clot on final venography.  Right IJ venography demonstrated patency of the SVC. The stent extends from the distal right innominate vein to the SVC, covering the inflow of the left innominate vein. No collateral filling was identified. There is mild narrowing of the stent at the confluence of the innominate veins.  The central left innominate vein stenosis responded well to 10 mm balloon angioplasty. Follow-up venography showed significant improvement in diameter of the vein at its confluence with the SVC at the level of the sidewall stent. The proximal SVC stenosis responded  readily to 10 mm balloon angioplasty and follow-up venography showed no extravasation, residual or recurrent stenosis.  IMPRESSION: 1. Extensive occlusive left upper extremity, subclavian, and innominate vein DVT, with successful TPA assisted mechanical thrombectomy. 2. The SVC stent remains patent. Non opacification on prior CT probably secondary to injection through the port catheter. 3. The SVC stent covers the inflow from the left innominate vein. Additionally, tapered narrowing of the central left innominate vein just proximal to its confluence with the SVC suggests involvement by the patient's known recurrent tumor. Although this region responded to 10 mm balloon angioplasty, she remains at risk for recurrence of thrombosis and should probably REMAIN ON LIFELONG ANTICOAGULATION if tolerated. 4. Mild proximal SVC stent stenosis, with good response to 10 mm balloon angioplasty.   Electronically Signed   By: Lucrezia Europe M.D.   On: 12/01/2014 12:54   Ir US Guide Vasc Access Left  12/01/2014   INDICATION: Right SVC clot and left arm brachial vein clot. 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 12 mm SVC stent placed 10/17/2014.  EXAM: THROMBOECTOMY MECHANICAL VENOUS; PTA VENOUS; LEFT EXTREMITY VENOGRAPHY; IR ULTRASOUND GUIDANCE VASC ACCESS RIGHT; IR ULTRASOUND GUIDANCE VASC ACCESS  LEFT; SUPERIOR VENA CAVOGRAM  COMPARISON:  CT 11/30/2014 and earlier studies  MEDICATIONS: Intravenous Fentanyl and Versed were administered as conscious sedation during continuous cardiorespiratory monitoring by the radiology RN, with a total moderate sedation time of 60 minutes.  CONTRAST:  36mL OMNIPAQUE IOHEXOL 300 MG/ML  SOLN  FLUOROSCOPY TIME:  5 minutes.  42 seconds.  COMPLICATIONS: None immediate  TECHNIQUE: Informed written consent was obtained from the patient after a discussion of the risks, benefits and alternatives to treatment. Questions regarding the procedure were encouraged and answered. A timeout was performed prior to the initiation of the procedure.  Survey ultrasound of the left upper extremity was performed. The brachial DVT was localized and a more peripheral patent segment of the brachial vein was identified for access. Overlying skin site was marked.  The site was prepped and draped in the usual sterile fashion, and a sterile drape was applied covering the operative field. Maximum barrier sterile technique with sterile gowns and gloves were used for the procedure. A timeout was performed prior to the initiation of the procedure. Local anesthesia was provided with 1% lidocaine.  Under real-time ultrasound guidance, access to the left brachial vein was achieved with a micropuncture set. Left upper extremity venography was performed. The dilator was exchanged over a Bentson for a 6 [place] vascular sheath, through which a 5 [place] Kumpe catheter was advanced to the SVC. This was exchanged for a Angiojet catheter, which allowed pulse spray delivery of 10 mg tPA in100 mL sterile saline through the left upper extremity, subclavian and innominate vein clot. The Angiojet catheter would not advance across the stent into the SVC, so right IJ access was achieved. As before, the region was prepped and draped in usual sterile fashion and ultrasound-guided micropuncture access to the right IJ was achieved.  Venography was performed. After the TPA had been allowed least 20 minutes post pulse spray, the Angiojet was again utilized in thrombectomy mode to extract the thrombus from the left upper extremity, subclavian and innominate veins. Residual occlusive thrombus in the central left subclavian and innominate vein was treated with additional passes of the Angiojet thrombectomy catheter. This was exchanged for a 10 mm Mustang balloon, advanced to the confluence of the left innominate vein and SVC  for dilatation of a persistent stenosis. In similar fashion, the 10 mm balloon was advanced via the right IJ access for PTA of a proximal SVC stenosis. Final venography at both sites was performed. The catheters, guidewires and sheaths were removed and hemostasis achieved at the site with manual compression. The patient tolerated the procedure well.  FINDINGS: Left upper extremity venography demonstrated occlusive clot in the central aspect of the left brachial vein extending through the axillary vein, left subclavian vein and left innominate vein. There is tapered narrowing of the innominate vein near its confluence with the SVC. There was non opacification of the SVC. The left upper extremity, subclavian, and innominate thrombus was treated with mechanical thrombectomy after pulse spray of tPA as detailed above. No significant residual clot on final venography.  Right IJ venography demonstrated patency of the SVC. The stent extends from the distal right innominate vein to the SVC, covering the inflow of the left innominate vein. No collateral filling was identified. There is mild narrowing of the stent at the confluence of the innominate veins.  The central left innominate vein stenosis responded well to 10 mm balloon angioplasty. Follow-up venography showed significant improvement in diameter of the vein at its confluence with the SVC at the level of the sidewall stent. The proximal SVC stenosis responded readily to 10 mm  balloon angioplasty and follow-up venography showed no extravasation, residual or recurrent stenosis.  IMPRESSION: 1. Extensive occlusive left upper extremity, subclavian, and innominate vein DVT, with successful TPA assisted mechanical thrombectomy. 2. The SVC stent remains patent. Non opacification on prior CT probably secondary to injection through the port catheter. 3. The SVC stent covers the inflow from the left innominate vein. Additionally, tapered narrowing of the central left innominate vein just proximal to its confluence with the SVC suggests involvement by the patient's known recurrent tumor. Although this region responded to 10 mm balloon angioplasty, she remains at risk for recurrence of thrombosis and should probably REMAIN ON LIFELONG ANTICOAGULATION if tolerated. 4. Mild proximal SVC stent stenosis, with good response to 10 mm balloon angioplasty.   Electronically Signed   By: Lucrezia Europe M.D.   On: 12/01/2014 12:54   Ir US Guide Vasc Access Right  12/01/2014   INDICATION: Right SVC clot and left arm brachial vein clot. 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 12 mm SVC stent placed 10/17/2014.  EXAM: THROMBOECTOMY MECHANICAL VENOUS; PTA VENOUS; LEFT EXTREMITY VENOGRAPHY; IR ULTRASOUND GUIDANCE VASC ACCESS RIGHT; IR ULTRASOUND GUIDANCE VASC ACCESS LEFT; SUPERIOR VENA CAVOGRAM  COMPARISON:  CT 11/30/2014 and earlier studies  MEDICATIONS: Intravenous Fentanyl and Versed were administered as conscious sedation during continuous cardiorespiratory monitoring by the radiology RN, with a total moderate sedation time of 60 minutes.  CONTRAST:  61mL OMNIPAQUE IOHEXOL 300 MG/ML  SOLN  FLUOROSCOPY TIME:  5 minutes.  42 seconds.  COMPLICATIONS: None immediate  TECHNIQUE: Informed written consent was obtained from the patient after a discussion of the risks, benefits and alternatives to treatment. Questions regarding the procedure were encouraged and answered.  A timeout was performed prior to the initiation of the procedure.  Survey ultrasound of the left upper extremity was performed. The brachial DVT was localized and a more peripheral patent segment of the brachial vein was identified for access. Overlying skin site was marked.  The site was prepped and draped in the usual sterile fashion, and a sterile drape was applied covering the operative field. Maximum barrier  sterile technique with sterile gowns and gloves were used for the procedure. A timeout was performed prior to the initiation of the procedure. Local anesthesia was provided with 1% lidocaine.  Under real-time ultrasound guidance, access to the left brachial vein was achieved with a micropuncture set. Left upper extremity venography was performed. The dilator was exchanged over a Bentson for a 6 [place] vascular sheath, through which a 5 [place] Kumpe catheter was advanced to the SVC. This was exchanged for a Angiojet catheter, which allowed pulse spray delivery of 10 mg tPA in100 mL sterile saline through the left upper extremity, subclavian and innominate vein clot. The Angiojet catheter would not advance across the stent into the SVC, so right IJ access was achieved. As before, the region was prepped and draped in usual sterile fashion and ultrasound-guided micropuncture access to the right IJ was achieved. Venography was performed. After the TPA had been allowed least 20 minutes post pulse spray, the Angiojet was again utilized in thrombectomy mode to extract the thrombus from the left upper extremity, subclavian and innominate veins. Residual occlusive thrombus in the central left subclavian and innominate vein was treated with additional passes of the Angiojet thrombectomy catheter. This was exchanged for a 10 mm Mustang balloon, advanced to the confluence of the left innominate vein and SVC for dilatation of a persistent stenosis. In similar fashion, the 10 mm balloon was advanced via the right IJ  access for PTA of a proximal SVC stenosis. Final venography at both sites was performed. The catheters, guidewires and sheaths were removed and hemostasis achieved at the site with manual compression. The patient tolerated the procedure well.  FINDINGS: Left upper extremity venography demonstrated occlusive clot in the central aspect of the left brachial vein extending through the axillary vein, left subclavian vein and left innominate vein. There is tapered narrowing of the innominate vein near its confluence with the SVC. There was non opacification of the SVC. The left upper extremity, subclavian, and innominate thrombus was treated with mechanical thrombectomy after pulse spray of tPA as detailed above. No significant residual clot on final venography.  Right IJ venography demonstrated patency of the SVC. The stent extends from the distal right innominate vein to the SVC, covering the inflow of the left innominate vein. No collateral filling was identified. There is mild narrowing of the stent at the confluence of the innominate veins.  The central left innominate vein stenosis responded well to 10 mm balloon angioplasty. Follow-up venography showed significant improvement in diameter of the vein at its confluence with the SVC at the level of the sidewall stent. The proximal SVC stenosis responded readily to 10 mm balloon angioplasty and follow-up venography showed no extravasation, residual or recurrent stenosis.  IMPRESSION: 1. Extensive occlusive left upper extremity, subclavian, and innominate vein DVT, with successful TPA assisted mechanical thrombectomy. 2. The SVC stent remains patent. Non opacification on prior CT probably secondary to injection through the port catheter. 3. The SVC stent covers the inflow from the left innominate vein. Additionally, tapered narrowing of the central left innominate vein just proximal to its confluence with the SVC suggests involvement by the patient's known recurrent  tumor. Although this region responded to 10 mm balloon angioplasty, she remains at risk for recurrence of thrombosis and should probably REMAIN ON LIFELONG ANTICOAGULATION if tolerated. 4. Mild proximal SVC stent stenosis, with good response to 10 mm balloon angioplasty.   Electronically Signed   By: Lucrezia Europe M.D.   On: 12/01/2014 12:54  Labs:  CBC:  Recent Labs  12/02/14 0447 12/03/14 0042 12/03/14 0754 12/04/14 0430 12/05/14 0500  WBC 5.2 5.4  --  4.5 2.1*  HGB 9.5* 8.6* 8.9* 8.3* 8.3*  HCT 29.5* 26.5* 27.7* 26.0* 25.4*  PLT 181 181  --  169 117*    COAGS:  Recent Labs  10/17/14 1110 11/30/14 1444  INR 1.11 1.14  APTT 29 30    BMP:  Recent Labs  11/04/14 0435  11/28/14 1320 11/30/14 1450 12/02/14 0447 12/05/14 0500  NA 135  < > 140 137 135 137  K 3.8  < > 3.8 4.0 3.7 3.9  CL 100  --   --  102 97 101  CO2 27  < > 22 26 30 30   GLUCOSE 173*  < > 219* 178* 173* 141*  BUN 8  < > 5.6* 7 <5* <5*  CALCIUM 8.2*  < > 8.3* 8.1* 7.9* 8.2*  CREATININE 0.61  < > 0.8 0.59 0.59 0.53  GFRNONAA >90  --   --  >90 >90 >90  GFRAA >90  --   --  >90 >90 >90  < > = values in this interval not displayed.  LIVER FUNCTION TESTS:  Recent Labs  11/07/14 1205 11/14/14 1354 11/21/14 1245 11/28/14 1320  BILITOT 0.27 0.33 0.25 0.23  AST 24 23 23 20   ALT 13 21 25 18   ALKPHOS 77 75 73 80  PROT 7.3 6.8 6.7 6.9  ALBUMIN 3.4* 3.3* 3.3* 3.6    Assessment and Plan:  LUE thrombectomy/lysis/pta innominate v 12/01/14 Redness noted of upper arm skin 2/17 [place] neg  Continue elevation/ cool compresses Pt now on Vanco per MD Will follow few days  Signed: Kemora Pinard A 12/05/2014, 8:39 AM   I spent a total of 15 Minutes in face to face in clinical consultation/evaluation, greater than 50% of which was counseling/coordinating care for LUE redness

## 2014-12-05 NOTE — Progress Notes (Signed)
ANTIBIOTIC and [facility] for Vancomycin and Lovenox Indication: cellultis and right SVC clot and left arm brachial vein clot   Allergies  Allergen Reactions  . Sulfonamide Derivatives Rash  . Codeine Other (See Comments)    hallucinations  . Dilaudid [Hydromorphone Hcl] Nausea And Vomiting and Other (See Comments)    Room started spinning.  Pt has been okay with low doses and nausea meds administered first  . Penicillins Other (See Comments)    Childhood allergy; reaction unknown  . Vicodin [Hydrocodone-Acetaminophen] Rash    Patient Measurements: Height: 5\' 7"  (170.2 cm) Weight: 173 lb 9.6 oz (78.744 kg) IBW/kg (Calculated) : 61.6  Vital Signs: Temp: 97.6 F (36.4 C) (02/18 0900) Temp Source: Oral (02/18 0900) BP: 109/59 mmHg (02/18 0900) Pulse Rate: 109 (02/18 0900) Intake/Output from previous day: 02/17 0701 - 02/18 0700 In: 1364 [P.O.:1164; IV Piggyback:200] Out: 2250 [Urine:2250] Intake/Output from this shift: Total I/O In: 730 [P.O.:720; I.V.:10] Out: 250 [Urine:250]  Labs:  Recent Labs  12/03/14 0042 12/03/14 0754 12/04/14 0430 12/05/14 0500  WBC 5.4  --  4.5 2.1*  HGB 8.6* 8.9* 8.3* 8.3*  PLT 181  --  169 117*  CREATININE  --   --   --  0.53   Estimated Creatinine Clearance: 80.8 mL/min (by C-G formula based on Cr of 0.53).  Assessment:   Day # 3 Vancomycin for left arm swelling with redness. Severe cellulitis per ultrasound. Dopplers negative for DVT or superficial thrombus. Afebrile, WBC down to 2.1.    Transitioned from IV heparin to Lovenox 120 mg sq q24hrs on 12/03/13. No further drop in Hgb today, but platelet count has dropped to 117K. Day #7 heparin/Lovenox. Baseline platelet count 207K on 11/28/14. Patient reports no bleeding. One episode of hematuria on 2/14 but no recurrence.  Goal of Therapy:  Vancomycin trough level 10-15 mcg/ml  Anti-Xa level 0.6-1 units/ml 4hrs after LMWH dose given Monitor platelets by  anticoagula tion protocol: Yes  Plan:   Continue Vancomycin 1 gram IV q12hrs.  Will follow renal function, ultrasound and dopplers, progress.  Continue on Lovenox 120 mg sq q24hrs.  CBC in am, watch platelet count.  Arty Baumgartner, [place] Pager: [phone] 12/05/2014,11:25 AM

## 2014-12-06 ENCOUNTER — Other Ambulatory Visit: Payer: Self-pay | Admitting: Radiology

## 2014-12-06 ENCOUNTER — Telehealth: Payer: Self-pay | Admitting: Internal Medicine

## 2014-12-06 ENCOUNTER — Telehealth: Payer: Self-pay | Admitting: *Deleted

## 2014-12-06 DIAGNOSIS — I871 Compression of vein: Secondary | ICD-10-CM

## 2014-12-06 LAB — CBC
HEMATOCRIT: 26 % — AB (ref 36.0–46.0)
HEMOGLOBIN: 8.5 g/dL — AB (ref 12.0–15.0)
MCH: 27.2 pg (ref 26.0–34.0)
MCHC: 32.7 g/dL (ref 30.0–36.0)
MCV: 83.3 fL (ref 78.0–100.0)
Platelets: 106 10*3/uL — ABNORMAL LOW (ref 150–400)
RBC: 3.12 MIL/uL — ABNORMAL LOW (ref 3.87–5.11)
RDW: 16.5 % — AB (ref 11.5–15.5)
WBC: 5 10*3/uL (ref 4.0–10.5)

## 2014-12-06 LAB — GLUCOSE, CAPILLARY
GLUCOSE-CAPILLARY: 223 mg/dL — AB (ref 70–99)
GLUCOSE-CAPILLARY: 237 mg/dL — AB (ref 70–99)
Glucose-Capillary: 170 mg/dL — ABNORMAL HIGH (ref 70–99)

## 2014-12-06 MED ORDER — SACCHAROMYCES BOULARDII 250 MG PO CAPS: 250.0000 mg | ORAL_CAPSULE | Freq: Two times a day (BID) | ORAL | Status: DC

## 2014-12-06 MED ORDER — HEPARIN SOD (PORK) LOCK FLUSH 100 UNIT/ML IV SOLN
500.0000 [IU] | INTRAVENOUS | Status: AC | PRN
  Administered 2014-12-06: 500 [IU]

## 2014-12-06 MED ORDER — ENOXAPARIN SODIUM 120 MG/0.8ML ~~LOC~~ SOLN
120.0000 mg | Freq: Every day | SUBCUTANEOUS | Status: DC
Start: 2014-12-06 — End: 2015-02-20

## 2014-12-06 MED ORDER — ASPIRIN 81 MG PO TABS: 81.0000 mg | ORAL_TABLET | Freq: Every day | ORAL | Status: DC

## 2014-12-06 MED ORDER — CLINDAMYCIN HCL 300 MG PO CAPS: 600.0000 mg | ORAL_CAPSULE | Freq: Three times a day (TID) | ORAL | Status: DC

## 2014-12-06 NOTE — Discharge Summary (Signed)
Janice Ball, is a 59 y.o. female  DOB [birth year]  MRN [id number].  Admission date:  11/30/2014  Admitting Physician  Debbe Odea, MD  Discharge Date:  12/06/2014   Primary MD  [name], NP  Recommendations for primary care physician for things to follow:   Monitor left arm cellulitis closely.  She must  follow with oncology and IR within a week  Check CBC, BMP and TSH next visit    Admission Diagnosis  SVC syndrome [I87.1] SOB (shortness of breath) [R06.02] Tachycardia [R00.0] Hypoxia [R09.02] Blood clot due to device, implant, or graft, initial encounter [T85.81XA]   Discharge Diagnosis  SVC syndrome [I87.1] SOB (shortness of breath) [R06.02] Tachycardia [R00.0] Hypoxia [R09.02] Blood clot due to device, implant, or graft, initial encounter [T85.81XA]     Principal Problem:   SVC syndrome Active Problems:   Primary cancer of right upper lobe of lung   DM type 2 (diabetes mellitus, type 2)   Cellulitis      Past Medical History  Diagnosis Date  . Anxiety   . Hyperlipidemia   . Hypothyroidism   . COPD (chronic obstructive pulmonary disease)   . Arthritis     thumbs  . 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 Diabetes mellitus   . Lung cancer dx'd 05/2009  . Lung cancer dx'd 09/2013    recurrence in LN  . 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 Thyroidectomy, partial    . Tonsillectomy    . Tubal ligation    . Partial hysterectomy         History of present illness and  Hospital Course:     Kindly see H&P for history of present illness and admission details, please review complete Labs, Consult reports and Test reports for all details in brief  HPI  from the  history and physical done on the day of admission   Janice Ball is a 59 y.o. female with history of small cell lung cancer diagnosed 5 years ago currently undergoing chemotherapy, hypothyroidism, COPD, diabetes mellitus, SVC stent who presents with "swelling of her neck" and shortness of breath. She noticed her left arm was swollen a few days ago and underwent a venous duplex which revealed a DVT in the right subclavian, left internal jugular, left subclavian left axillary left basophilic cephalic radial and ulnar veins and left brachial She was started on Lovenox yesterday.   Hospital Course    1. Possible occlusion of Right SVC Stent with suspected clot and new left arm brachial vein clot. 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 Status post venogram and TPA by IR, venogram revealed patent SVC stent on the right side but extensive left upper extremity clot burden requiring TPA in the left arm by IR. ,  I discussed her case with her oncologist Dr. [name] on 12/03/2014 with switch  her to Lovenox if no further hematuria discharged on Lovenox. Discussed over the phone with Dr. Bridgett Larsson vascular surgeon on 12/01/2014. She will be now discharged on Lovenox indefinitely and will follow with hematology in IR on a close basis.    2. Right upper lobe non-small cell lung cancer. Undergoing repeat chemotherapy under Dr. [name]. Outpatient follow-up post discharge.   3. Baseline sinus tachycardia. Heart rate between 110 and 120 at baseline reviewed Dr. Worthy Flank office note. Stable.   4. Hypothyroidism. Continue Synthroid. Request PCP to check TSH 1 time on neck visit.   5. Diabetes mellitus type 2. Resume home regimen at this time.           6. Cellulitis - Left arm swelling with redness on 12/04/2014 post TPA in the left arm due to clot. Remained afebrile without leukocytosis, was treated with 3 days of IV vancomycin, cellulitis much improved  place her on 10 MIs in for 10 more days along with probiotic follow with PCP closely. Kuester to keep arm elevated. Requested to follow with IR as well 1 time post discharge.         Discharge Condition: Stable   Follow UP  Follow-up Information    Follow up with [facility], NP. Schedule an appointment as soon as possible for a visit in 1 week.   Specialty:  Nurse Practitioner   Contact information:   [address] [place] [address] [phone]       Follow up with HASSELL III, Wynona Luna, MD. Schedule an appointment as soon as possible for a visit in 3 days.   Specialty:  Interventional Radiology   Why:  L arm DVT - TPA- Cellulits   Contact information:   [address] [place] [address] [phone]       Follow up with Eilleen Kempf., MD. Schedule an appointment as soon as possible for a visit in 3 days.   Specialty:  Oncology   Why:  Lung Cancer   Contact information:   [place] [address] [phone]         Discharge Instructions  and  Discharge Medications      Discharge Instructions    Discharge instructions    Complete by:  As directed   Follow with Primary MD [name], NP in 7 days   Get CBC, CMP, 2 view Chest X ray checked  by Primary MD next visit.    Activity: As tolerated with Full fall precautions use walker/cane & assistance as needed                Keep left arm elevated   Disposition Home     Diet: Heart Healthy Low Carb.  For Heart failure patients - Check your Weight same time everyday, if you gain over 2 pounds, or you develop in leg swelling, experience more shortness of breath or chest pain, call your Primary MD immediately. Follow Cardiac Low Salt Diet and 1.5 lit/day fluid restriction.   On your next visit with your primary care physician please Get Medicines reviewed and adjusted.   Please request your Prim.MD to go over all Hospital Tests and  Procedure/Radiological results at the follow up, please get all Hospital records sent to your Prim MD by signing hospital release before you go home.   If you experience worsening of your admission symptoms, develop shortness of breath, life threatening emergency, suicidal or homicidal thoughts you must seek medical attention immediately by calling 911 or  calling your MD immediately  if symptoms less severe.  You Must read complete instructions/literature along with all the possible adverse reactions/side effects for all the Medicines you take and that have been prescribed to you. Take any new Medicines after you have completely understood and accpet all the possible adverse reactions/side effects.   Do not drive, operating heavy machinery, perform activities at heights, swimming or participation in water activities or provide baby sitting services if your were admitted for syncope or siezures until you have seen by Primary MD or a Neurologist and advised to do so again.  Do not drive when taking Pain medications.    Do not take more than prescribed Pain, Sleep and Anxiety Medications  Special Instructions: If you have smoked or chewed Tobacco  in the last 2 yrs please stop smoking, stop any regular Alcohol  and or any Recreational drug use.  Wear Seat belts while driving.   Please note  You were cared for by a hospitalist during your hospital stay. If you have any questions about your discharge medications or the care you received while you were in the hospital after you are discharged, you can call the unit and asked to speak with the hospitalist on call if the hospitalist that took care of you is not available. Once you are discharged, your primary care physician will handle any further medical issues. Please note that NO REFILLS for any discharge medications will be authorized once you are discharged, as it is imperative that you return to your primary care physician (or establish a  relationship with a primary care physician if you do not have one) for your aftercare needs so that they can reassess your need for medications and monitor your lab values.     Increase activity slowly    Complete by:  As directed             Medication List    TAKE these medications        acetaminophen 325 MG tablet  Commonly known as:  TYLENOL  Take 650 mg by mouth every 6 (six) hours as needed for mild pain or headache.     alendronate 10 MG tablet  Commonly known as:  FOSAMAX  Take 10 mg by mouth daily.     aspirin 81 MG tablet  Take 1 tablet (81 mg total) by mouth daily with breakfast.     budesonide-formoterol 160-4.5 MCG/ACT inhaler  Commonly known as:  SYMBICORT  Inhale 2 puffs into the lungs 2 (two) times daily.     cholecalciferol 1000 UNITS tablet  Commonly known as:  VITAMIN D  Take 1,000 Units by mouth every morning.     clindamycin 300 MG capsule  Commonly known as:  CLEOCIN  Take 2 capsules (600 mg total) by mouth 3 (three) times daily.     cyanocobalamin 1000 MCG tablet  Take 1,000 mcg by mouth every morning.     DSS 100 MG Caps  Take 100 mg by mouth 2 (two) times daily.     enoxaparin 120 MG/0.8ML injection  Commonly known as:  LOVENOX  Inject 0.8 mLs (120 mg total) into the skin daily.     glimepiride 2 MG tablet  Commonly known as:  AMARYL  Take 2 mg by mouth daily with breakfast.     guaiFENesin 600 MG 12 hr tablet  Commonly known as:  MUCINEX  Take 1,200 mg by mouth 2 (two) times daily.     ipratropium 0.02 % nebulizer  solution  Commonly known as:  ATROVENT  Take 2.5 mLs (0.5 mg total) by nebulization 4 (four) times daily.     levothyroxine 125 MCG tablet  Commonly known as:  SYNTHROID, LEVOTHROID  Take 125 mcg by mouth daily before breakfast.     lidocaine-prilocaine cream  Commonly known as:  EMLA  Apply 1 application topically as needed. Apply to port 1 hr before chemo     LORazepam 1 MG tablet  Commonly known as:  ATIVAN    Take 1 mg by mouth 3 (three) times daily as needed for anxiety.     magnesium gluconate 500 MG tablet  Commonly known as:  MAGONATE  Take 500 mg by mouth every morning.     metFORMIN 1000 MG tablet  Commonly known as:  GLUCOPHAGE  Take 1,000 mg by mouth 2 (two) times daily with a meal.     multivitamin with minerals Tabs tablet  Take 1 tablet by mouth every morning.     OVER THE COUNTER MEDICATION  Take 1 capsule by mouth daily. MSM. For energy and joint pain     oxyCODONE 5 MG immediate release tablet  Commonly known as:  Oxy IR/ROXICODONE  Take 1 tablet (5 mg total) by mouth every 4 (four) hours as needed for moderate pain.     albuterol 108 (90 BASE) MCG/ACT inhaler  Commonly known as:  PROVENTIL HFA;VENTOLIN HFA  Inhale 1 puff into the lungs every 6 (six) hours as needed for wheezing or shortness of breath.     PROAIR HFA 108 (90 BASE) MCG/ACT inhaler  Generic drug:  albuterol  INHALE 2 PUFFS INTO THE LUNGS EVERY 6 (SIX) HOURS AS NEEDED FOR WHEEZING OR SHORTNESS OF BREATH.     prochlorperazine 10 MG tablet  Commonly known as:  COMPAZINE  Take 1 tablet (10 mg total) by mouth every 6 (six) hours as needed for nausea or vomiting.     saccharomyces boulardii 250 MG capsule  Commonly known as:  FLORASTOR  Take 1 capsule (250 mg total) by mouth 2 (two) times daily.     sertraline 100 MG tablet  Commonly known as:  ZOLOFT  Take 150 mg by mouth at bedtime.     spironolactone 25 MG tablet  Commonly known as:  ALDACTONE  Take 25 mg by mouth every morning.          Diet and Activity recommendation: See Discharge Instructions above   Consults obtained - IR, Vas Surg Dr Bridgett Larsson over the phone, Dr. [name] oncology over the phone   Major procedures and Radiology Reports - PLEASE review detailed and final reports for all details, in brief -    L Arm TPA   Dg Chest 2 View  12/05/2014   CLINICAL DATA:  Cough for several days  EXAM: CHEST  2 VIEW  COMPARISON:  11/30/2014   FINDINGS: Cardiac shadow is stable. Persistent changes of right-sided lung neoplasm are seen. A right chest wall port is again noted. SVC stent is again identified. No new focal infiltrate or sizable effusion is seen.  IMPRESSION: Stable appearance from the previous exam. No acute abnormality is noted.   Electronically Signed   By: Inez Catalina M.D.   On: 12/05/2014 09:15   Ct Angio Chest Pe W/cm &/or Wo Cm  11/30/2014   ADDENDUM REPORT: 11/30/2014 16:59  ADDENDUM: Critical Value/emergent results were called by telephone at the time of interpretation on 11/30/2014 at 4:56 pm to Dr. Noemi Chapel , who verbally acknowledged  these results.   Electronically Signed   By: Genevie Ann M.D.   On: 11/30/2014 16:59   11/30/2014   CLINICAL DATA:  59 year old female with shortness of breath. Positive left upper extremity DVT. History of lung cancer and COPD.   Initial encounter.  EXAM: CT ANGIOGRAPHY CHEST WITH CONTRAST  TECHNIQUE: Multidetector CT imaging of the chest was performed using the standard protocol during bolus administration of intravenous contrast. Multiplanar CT image reconstructions and MIPs were obtained to evaluate the vascular anatomy.  CONTRAST:  144mL OMNIPAQUE IOHEXOL 350 MG/ML SOLN  COMPARISON:  Chest CTA 10/09/2014.  Common chest CTA 11/03/2014.  FINDINGS: Good contrast bolus timing in the pulmonary arterial tree. No pulmonary artery filling defect consistent with pulmonary embolus.  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 The right chest porta cath was injected common the catheter passed through a vascular stent of the SVC, which does also thrombosis (series 6, image 104). Some of the right upper lobe pulmonary arteries are significantly narrowed by tumor (image when 18).  At the same time mediastinal tumor bulk has slightly regressed since 10/09/2014 (such as on series 4, image 35 measuring 48 x 43 mm canal  versus 57 x 59 mm at a comparable level previously).  No pericardial or pleural effusion. Narrowing of the carina and central right lung airways has not significantly changed. Spiculated peribronchovascular opacity in the right upper lobe is stable to slightly regressed. No areas of worsening ventilation.  Stable visualized aorta and great vessels with atherosclerosis. Stable visualized upper abdominal viscera. No acute osseous abnormality identified.  Review of the MIP images confirms the above findings.  IMPRESSION: 1. 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 2. Tumor bulky is stable to slightly regressed since December. 3. No new chest abnormality identified.  Electronically Signed: By: Genevie Ann M.D. On: 11/30/2014 16:53   Ir Veno/ext/uni Left  12/01/2014   INDICATION: Right SVC clot and left arm brachial vein clot. 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 12 mm SVC stent placed 10/17/2014.  EXAM: THROMBOECTOMY MECHANICAL VENOUS; PTA VENOUS; LEFT EXTREMITY VENOGRAPHY; IR ULTRASOUND GUIDANCE VASC ACCESS RIGHT; IR ULTRASOUND GUIDANCE VASC ACCESS LEFT; SUPERIOR VENA CAVOGRAM  COMPARISON:  CT 11/30/2014 and earlier studies  MEDICATIONS: Intravenous Fentanyl and Versed were administered as conscious sedation during continuous cardiorespiratory monitoring by the radiology RN, with a total moderate sedation time of 60 minutes.  CONTRAST:  81mL OMNIPAQUE IOHEXOL 300 MG/ML  SOLN  FLUOROSCOPY TIME:  5 minutes.  42 seconds.  COMPLICATIONS: None immediate  TECHNIQUE: Informed written consent was obtained from the patient after a discussion of the risks, benefits and alternatives to treatment. Questions regarding the procedure were encouraged and answered. A timeout was performed prior to the initiation of  the procedure.  Survey ultrasound of the left upper extremity was performed. The brachial DVT was localized and a more peripheral patent segment of the brachial vein was identified for access. Overlying skin site was marked.  The site was prepped and draped in the usual sterile fashion, and a sterile drape was applied covering the operative field. Maximum barrier sterile technique with sterile gowns and gloves were used for the procedure. A timeout was performed prior to the initiation of the procedure.  Local anesthesia was provided with 1% lidocaine.  Under real-time ultrasound guidance, access to the left brachial vein was achieved with a micropuncture set. Left upper extremity venography was performed. The dilator was exchanged over a Bentson for a 6 [place] vascular sheath, through which a 5 [place] Kumpe catheter was advanced to the SVC. This was exchanged for a Angiojet catheter, which allowed pulse spray delivery of 10 mg tPA in100 mL sterile saline through the left upper extremity, subclavian and innominate vein clot. The Angiojet catheter would not advance across the stent into the SVC, so right IJ access was achieved. As before, the region was prepped and draped in usual sterile fashion and ultrasound-guided micropuncture access to the right IJ was achieved. Venography was performed. After the TPA had been allowed least 20 minutes post pulse spray, the Angiojet was again utilized in thrombectomy mode to extract the thrombus from the left upper extremity, subclavian and innominate veins. Residual occlusive thrombus in the central left subclavian and innominate vein was treated with additional passes of the Angiojet thrombectomy catheter. This was exchanged for a 10 mm Mustang balloon, advanced to the confluence of the left innominate vein and SVC for dilatation of a persistent stenosis. In similar fashion, the 10 mm balloon was advanced via the right IJ access for PTA of a proximal SVC stenosis. Final  venography at both sites was performed. The catheters, guidewires and sheaths were removed and hemostasis achieved at the site with manual compression. The patient tolerated the procedure well.  FINDINGS: Left upper extremity venography demonstrated occlusive clot in the central aspect of the left brachial vein extending through the axillary vein, left subclavian vein and left innominate vein. There is tapered narrowing of the innominate vein near its confluence with the SVC. There was non opacification of the SVC. The left upper extremity, subclavian, and innominate thrombus was treated with mechanical thrombectomy after pulse spray of tPA as detailed above. No significant residual clot on final venography.  Right IJ venography demonstrated patency of the SVC. The stent extends from the distal right innominate vein to the SVC, covering the inflow of the left innominate vein. No collateral filling was identified. There is mild narrowing of the stent at the confluence of the innominate veins.  The central left innominate vein stenosis responded well to 10 mm balloon angioplasty. Follow-up venography showed significant improvement in diameter of the vein at its confluence with the SVC at the level of the sidewall stent. The proximal SVC stenosis responded readily to 10 mm balloon angioplasty and follow-up venography showed no extravasation, residual or recurrent stenosis.  IMPRESSION: 1. Extensive occlusive left upper extremity, subclavian, and innominate vein DVT, with successful TPA assisted mechanical thrombectomy. 2. The SVC stent remains patent. Non opacification on prior CT probably secondary to injection through the port catheter. 3. The SVC stent covers the inflow from the left innominate vein. Additionally, tapered narrowing of the central left innominate vein just proximal to its confluence with the SVC suggests involvement by the patient's known recurrent tumor. Although this region responded to 10 mm  balloon angioplasty, she remains at risk for recurrence of thrombosis and should probably REMAIN ON LIFELONG ANTICOAGULATION if tolerated. 4. Mild proximal SVC stent stenosis, with good response to 10 mm balloon angioplasty.   Electronically Signed   By: Lucrezia Europe M.D.   On: 12/01/2014 12:54   Ir Venocavagram Svc  12/01/2014   INDICATION: Right SVC clot and left arm brachial vein clot. In a patient with history  of recurrent non-small cell lung cancer in the right upper lobe. 12 mm SVC stent placed 10/17/2014.  EXAM: THROMBOECTOMY MECHANICAL VENOUS; PTA VENOUS; LEFT EXTREMITY VENOGRAPHY; IR ULTRASOUND GUIDANCE VASC ACCESS RIGHT; IR ULTRASOUND GUIDANCE VASC ACCESS LEFT; SUPERIOR VENA CAVOGRAM  COMPARISON:  CT 11/30/2014 and earlier studies  MEDICATIONS: Intravenous Fentanyl and Versed were administered as conscious sedation during continuous cardiorespiratory monitoring by the radiology RN, with a total moderate sedation time of 60 minutes.  CONTRAST:  38mL OMNIPAQUE IOHEXOL 300 MG/ML  SOLN  FLUOROSCOPY TIME:  5 minutes.  42 seconds.  COMPLICATIONS: None immediate  TECHNIQUE: Informed written consent was obtained from the patient after a discussion of the risks, benefits and alternatives to treatment. Questions regarding the procedure were encouraged and answered. A timeout was performed prior to the initiation of the procedure.  Survey ultrasound of the left upper extremity was performed. The brachial DVT was localized and a more peripheral patent segment of the brachial vein was identified for access. Overlying skin site was marked.  The site was prepped and draped in the usual sterile fashion, and a sterile drape was applied covering the operative field. Maximum barrier sterile technique with sterile gowns and gloves were used for the procedure. A timeout was performed prior to the initiation of the procedure. Local anesthesia was provided with 1% lidocaine.  Under real-time ultrasound guidance, access to the  left brachial vein was achieved with a micropuncture set. Left upper extremity venography was performed. The dilator was exchanged over a Bentson for a 6 [place] vascular sheath, through which a 5 [place] Kumpe catheter was advanced to the SVC. This was exchanged for a Angiojet catheter, which allowed pulse spray delivery of 10 mg tPA in100 mL sterile saline through the left upper extremity, subclavian and innominate vein clot. The Angiojet catheter would not advance across the stent into the SVC, so right IJ access was achieved. As before, the region was prepped and draped in usual sterile fashion and ultrasound-guided micropuncture access to the right IJ was achieved. Venography was performed. After the TPA had been allowed least 20 minutes post pulse spray, the Angiojet was again utilized in thrombectomy mode to extract the thrombus from the left upper extremity, subclavian and innominate veins. Residual occlusive thrombus in the central left subclavian and innominate vein was treated with additional passes of the Angiojet thrombectomy catheter. This was exchanged for a 10 mm Mustang balloon, advanced to the confluence of the left innominate vein and SVC for dilatation of a persistent stenosis. In similar fashion, the 10 mm balloon was advanced via the right IJ access for PTA of a proximal SVC stenosis. Final venography at both sites was performed. The catheters, guidewires and sheaths were removed and hemostasis achieved at the site with manual compression. The patient tolerated the procedure well.  FINDINGS: Left upper extremity venography demonstrated occlusive clot in the central aspect of the left brachial vein extending through the axillary vein, left subclavian vein and left innominate vein. There is tapered narrowing of the innominate vein near its confluence with the SVC. There was non opacification of the SVC. The left upper extremity, subclavian, and innominate thrombus was treated with mechanical  thrombectomy after pulse spray of tPA as detailed above. No significant residual clot on final venography.  Right IJ venography demonstrated patency of the SVC. The stent extends from the distal right innominate vein to the SVC, covering the inflow of the left innominate vein. No collateral filling was identified. There is mild narrowing of  the stent at the confluence of the innominate veins.  The central left innominate vein stenosis responded well to 10 mm balloon angioplasty. Follow-up venography showed significant improvement in diameter of the vein at its confluence with the SVC at the level of the sidewall stent. The proximal SVC stenosis responded readily to 10 mm balloon angioplasty and follow-up venography showed no extravasation, residual or recurrent stenosis.  IMPRESSION: 1. Extensive occlusive left upper extremity, subclavian, and innominate vein DVT, with successful TPA assisted mechanical thrombectomy. 2. The SVC stent remains patent. Non opacification on prior CT probably secondary to injection through the port catheter. 3. The SVC stent covers the inflow from the left innominate vein. Additionally, tapered narrowing of the central left innominate vein just proximal to its confluence with the SVC suggests involvement by the patient's known recurrent tumor. Although this region responded to 10 mm balloon angioplasty, she remains at risk for recurrence of thrombosis and should probably REMAIN ON LIFELONG ANTICOAGULATION if tolerated. 4. Mild proximal SVC stent stenosis, with good response to 10 mm balloon angioplasty.   Electronically Signed   By: Lucrezia Europe M.D.   On: 12/01/2014 12:54   [place] Extrem Up Left Ltd  12/04/2014   CLINICAL DATA:  Evaluate cellulitis left antecubital fossa, clot removed 12/01/2014, redness and swelling left arm  EXAM: ULTRASOUND LEFT UPPER EXTREMITY LIMITED  TECHNIQUE: Ultrasound examination of the upper extremity soft tissues was performed in the area of clinical concern.   COMPARISON:  None  FINDINGS: There is severe edematous change in the soft tissues. There is no focal fluid collection.  IMPRESSION: Findings consistent with severe cellulitis.  No abscess identified.   Electronically Signed   By: Skipper Cliche M.D.   On: 12/04/2014 17:07   Ir Pta Venous Left  12/01/2014   INDICATION: Right SVC clot and left arm brachial vein clot. 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 12 mm SVC stent placed 10/17/2014.  EXAM: THROMBOECTOMY MECHANICAL VENOUS; PTA VENOUS; LEFT EXTREMITY VENOGRAPHY; IR ULTRASOUND GUIDANCE VASC ACCESS RIGHT; IR ULTRASOUND GUIDANCE VASC ACCESS LEFT; SUPERIOR VENA CAVOGRAM  COMPARISON:  CT 11/30/2014 and earlier studies  MEDICATIONS: Intravenous Fentanyl and Versed were administered as conscious sedation during continuous cardiorespiratory monitoring by the radiology RN, with a total moderate sedation time of 60 minutes.  CONTRAST:  68mL OMNIPAQUE IOHEXOL 300 MG/ML  SOLN  FLUOROSCOPY TIME:  5 minutes.  42 seconds.  COMPLICATIONS: None immediate  TECHNIQUE: Informed written consent was obtained from the patient after a discussion of the risks, benefits and alternatives to treatment. Questions regarding the procedure were encouraged and answered. A timeout was performed prior to the initiation of the procedure.  Survey ultrasound of the left upper extremity was performed. The brachial DVT was localized and a more peripheral patent segment of the brachial vein was identified for access. Overlying skin site was marked.  The site was prepped and draped in the usual sterile fashion, and a sterile drape was applied covering the operative field. Maximum barrier sterile technique with sterile gowns and gloves were used for the procedure. A timeout was performed prior to the initiation of the procedure. Local anesthesia was provided with 1% lidocaine.  Under real-time ultrasound guidance, access to the left brachial vein was  achieved with a micropuncture set. Left upper extremity venography was performed. The dilator was exchanged over a Bentson for a 6 [place] vascular sheath, through which a 5 [place] Kumpe catheter was advanced to  the SVC. This was exchanged for a Angiojet catheter, which allowed pulse spray delivery of 10 mg tPA in100 mL sterile saline through the left upper extremity, subclavian and innominate vein clot. The Angiojet catheter would not advance across the stent into the SVC, so right IJ access was achieved. As before, the region was prepped and draped in usual sterile fashion and ultrasound-guided micropuncture access to the right IJ was achieved. Venography was performed. After the TPA had been allowed least 20 minutes post pulse spray, the Angiojet was again utilized in thrombectomy mode to extract the thrombus from the left upper extremity, subclavian and innominate veins. Residual occlusive thrombus in the central left subclavian and innominate vein was treated with additional passes of the Angiojet thrombectomy catheter. This was exchanged for a 10 mm Mustang balloon, advanced to the confluence of the left innominate vein and SVC for dilatation of a persistent stenosis. In similar fashion, the 10 mm balloon was advanced via the right IJ access for PTA of a proximal SVC stenosis. Final venography at both sites was performed. The catheters, guidewires and sheaths were removed and hemostasis achieved at the site with manual compression. The patient tolerated the procedure well.  FINDINGS: Left upper extremity venography demonstrated occlusive clot in the central aspect of the left brachial vein extending through the axillary vein, left subclavian vein and left innominate vein. There is tapered narrowing of the innominate vein near its confluence with the SVC. There was non opacification of the SVC. The left upper extremity, subclavian, and innominate thrombus was treated with mechanical thrombectomy after pulse  spray of tPA as detailed above. No significant residual clot on final venography.  Right IJ venography demonstrated patency of the SVC. The stent extends from the distal right innominate vein to the SVC, covering the inflow of the left innominate vein. No collateral filling was identified. There is mild narrowing of the stent at the confluence of the innominate veins.  The central left innominate vein stenosis responded well to 10 mm balloon angioplasty. Follow-up venography showed significant improvement in diameter of the vein at its confluence with the SVC at the level of the sidewall stent. The proximal SVC stenosis responded readily to 10 mm balloon angioplasty and follow-up venography showed no extravasation, residual or recurrent stenosis.  IMPRESSION: 1. Extensive occlusive left upper extremity, subclavian, and innominate vein DVT, with successful TPA assisted mechanical thrombectomy. 2. The SVC stent remains patent. Non opacification on prior CT probably secondary to injection through the port catheter. 3. The SVC stent covers the inflow from the left innominate vein. Additionally, tapered narrowing of the central left innominate vein just proximal to its confluence with the SVC suggests involvement by the patient's known recurrent tumor. Although this region responded to 10 mm balloon angioplasty, she remains at risk for recurrence of thrombosis and should probably REMAIN ON LIFELONG ANTICOAGULATION if tolerated. 4. Mild proximal SVC stent stenosis, with good response to 10 mm balloon angioplasty.   Electronically Signed   By: Lucrezia Europe M.D.   On: 12/01/2014 12:54   Ir Pta Venous Right  12/01/2014   INDICATION: Right SVC clot and left arm brachial vein clot. 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 12 mm SVC stent placed 10/17/2014.  EXAM: THROMBOECTOMY MECHANICAL VENOUS; PTA VENOUS; LEFT EXTREMITY VENOGRAPHY; IR ULTRASOUND GUIDANCE VASC ACCESS RIGHT;  IR ULTRASOUND GUIDANCE VASC ACCESS LEFT; SUPERIOR VENA CAVOGRAM  COMPARISON:  CT 11/30/2014 and earlier studies  MEDICATIONS:  Intravenous Fentanyl and Versed were administered as conscious sedation during continuous cardiorespiratory monitoring by the radiology RN, with a total moderate sedation time of 60 minutes.  CONTRAST:  68mL OMNIPAQUE IOHEXOL 300 MG/ML  SOLN  FLUOROSCOPY TIME:  5 minutes.  42 seconds.  COMPLICATIONS: None immediate  TECHNIQUE: Informed written consent was obtained from the patient after a discussion of the risks, benefits and alternatives to treatment. Questions regarding the procedure were encouraged and answered. A timeout was performed prior to the initiation of the procedure.  Survey ultrasound of the left upper extremity was performed. The brachial DVT was localized and a more peripheral patent segment of the brachial vein was identified for access. Overlying skin site was marked.  The site was prepped and draped in the usual sterile fashion, and a sterile drape was applied covering the operative field. Maximum barrier sterile technique with sterile gowns and gloves were used for the procedure. A timeout was performed prior to the initiation of the procedure. Local anesthesia was provided with 1% lidocaine.  Under real-time ultrasound guidance, access to the left brachial vein was achieved with a micropuncture set. Left upper extremity venography was performed. The dilator was exchanged over a Bentson for a 6 [place] vascular sheath, through which a 5 [place] Kumpe catheter was advanced to the SVC. This was exchanged for a Angiojet catheter, which allowed pulse spray delivery of 10 mg tPA in100 mL sterile saline through the left upper extremity, subclavian and innominate vein clot. The Angiojet catheter would not advance across the stent into the SVC, so right IJ access was achieved. As before, the region was prepped and draped in usual sterile fashion and ultrasound-guided micropuncture  access to the right IJ was achieved. Venography was performed. After the TPA had been allowed least 20 minutes post pulse spray, the Angiojet was again utilized in thrombectomy mode to extract the thrombus from the left upper extremity, subclavian and innominate veins. Residual occlusive thrombus in the central left subclavian and innominate vein was treated with additional passes of the Angiojet thrombectomy catheter. This was exchanged for a 10 mm Mustang balloon, advanced to the confluence of the left innominate vein and SVC for dilatation of a persistent stenosis. In similar fashion, the 10 mm balloon was advanced via the right IJ access for PTA of a proximal SVC stenosis. Final venography at both sites was performed. The catheters, guidewires and sheaths were removed and hemostasis achieved at the site with manual compression. The patient tolerated the procedure well.  FINDINGS: Left upper extremity venography demonstrated occlusive clot in the central aspect of the left brachial vein extending through the axillary vein, left subclavian vein and left innominate vein. There is tapered narrowing of the innominate vein near its confluence with the SVC. There was non opacification of the SVC. The left upper extremity, subclavian, and innominate thrombus was treated with mechanical thrombectomy after pulse spray of tPA as detailed above. No significant residual clot on final venography.  Right IJ venography demonstrated patency of the SVC. The stent extends from the distal right innominate vein to the SVC, covering the inflow of the left innominate vein. No collateral filling was identified. There is mild narrowing of the stent at the confluence of the innominate veins.  The central left innominate vein stenosis responded well to 10 mm balloon angioplasty. Follow-up venography showed significant improvement in diameter of the vein at its confluence with the SVC at the level of the sidewall stent. The proximal SVC  stenosis responded readily  to 10 mm balloon angioplasty and follow-up venography showed no extravasation, residual or recurrent stenosis.  IMPRESSION: 1. Extensive occlusive left upper extremity, subclavian, and innominate vein DVT, with successful TPA assisted mechanical thrombectomy. 2. The SVC stent remains patent. Non opacification on prior CT probably secondary to injection through the port catheter. 3. The SVC stent covers the inflow from the left innominate vein. Additionally, tapered narrowing of the central left innominate vein just proximal to its confluence with the SVC suggests involvement by the patient's known recurrent tumor. Although this region responded to 10 mm balloon angioplasty, she remains at risk for recurrence of thrombosis and should probably REMAIN ON LIFELONG ANTICOAGULATION if tolerated. 4. Mild proximal SVC stent stenosis, with good response to 10 mm balloon angioplasty.   Electronically Signed   By: Lucrezia Europe M.D.   On: 12/01/2014 12:54   Ir Thrombect Veno Mech Mod Sed  12/01/2014   INDICATION: Right SVC clot and left arm brachial vein clot. 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 12 mm SVC stent placed 10/17/2014.  EXAM: THROMBOECTOMY MECHANICAL VENOUS; PTA VENOUS; LEFT EXTREMITY VENOGRAPHY; IR ULTRASOUND GUIDANCE VASC ACCESS RIGHT; IR ULTRASOUND GUIDANCE VASC ACCESS LEFT; SUPERIOR VENA CAVOGRAM  COMPARISON:  CT 11/30/2014 and earlier studies  MEDICATIONS: Intravenous Fentanyl and Versed were administered as conscious sedation during continuous cardiorespiratory monitoring by the radiology RN, with a total moderate sedation time of 60 minutes.  CONTRAST:  45mL OMNIPAQUE IOHEXOL 300 MG/ML  SOLN  FLUOROSCOPY TIME:  5 minutes.  42 seconds.  COMPLICATIONS: None immediate  TECHNIQUE: Informed written consent was obtained from the patient after a discussion of the risks, benefits and alternatives to treatment. Questions regarding the  procedure were encouraged and answered. A timeout was performed prior to the initiation of the procedure.  Survey ultrasound of the left upper extremity was performed. The brachial DVT was localized and a more peripheral patent segment of the brachial vein was identified for access. Overlying skin site was marked.  The site was prepped and draped in the usual sterile fashion, and a sterile drape was applied covering the operative field. Maximum barrier sterile technique with sterile gowns and gloves were used for the procedure. A timeout was performed prior to the initiation of the procedure. Local anesthesia was provided with 1% lidocaine.  Under real-time ultrasound guidance, access to the left brachial vein was achieved with a micropuncture set. Left upper extremity venography was performed. The dilator was exchanged over a Bentson for a 6 [place] vascular sheath, through which a 5 [place] Kumpe catheter was advanced to the SVC. This was exchanged for a Angiojet catheter, which allowed pulse spray delivery of 10 mg tPA in100 mL sterile saline through the left upper extremity, subclavian and innominate vein clot. The Angiojet catheter would not advance across the stent into the SVC, so right IJ access was achieved. As before, the region was prepped and draped in usual sterile fashion and ultrasound-guided micropuncture access to the right IJ was achieved. Venography was performed. After the TPA had been allowed least 20 minutes post pulse spray, the Angiojet was again utilized in thrombectomy mode to extract the thrombus from the left upper extremity, subclavian and innominate veins. Residual occlusive thrombus in the central left subclavian and innominate vein was treated with additional passes of the Angiojet thrombectomy catheter. This was exchanged for a 10 mm Mustang balloon, advanced to the confluence of the left innominate vein and SVC for dilatation  of a persistent stenosis. In similar fashion, the 10 mm  balloon was advanced via the right IJ access for PTA of a proximal SVC stenosis. Final venography at both sites was performed. The catheters, guidewires and sheaths were removed and hemostasis achieved at the site with manual compression. The patient tolerated the procedure well.  FINDINGS: Left upper extremity venography demonstrated occlusive clot in the central aspect of the left brachial vein extending through the axillary vein, left subclavian vein and left innominate vein. There is tapered narrowing of the innominate vein near its confluence with the SVC. There was non opacification of the SVC. The left upper extremity, subclavian, and innominate thrombus was treated with mechanical thrombectomy after pulse spray of tPA as detailed above. No significant residual clot on final venography.  Right IJ venography demonstrated patency of the SVC. The stent extends from the distal right innominate vein to the SVC, covering the inflow of the left innominate vein. No collateral filling was identified. There is mild narrowing of the stent at the confluence of the innominate veins.  The central left innominate vein stenosis responded well to 10 mm balloon angioplasty. Follow-up venography showed significant improvement in diameter of the vein at its confluence with the SVC at the level of the sidewall stent. The proximal SVC stenosis responded readily to 10 mm balloon angioplasty and follow-up venography showed no extravasation, residual or recurrent stenosis.  IMPRESSION: 1. Extensive occlusive left upper extremity, subclavian, and innominate vein DVT, with successful TPA assisted mechanical thrombectomy. 2. The SVC stent remains patent. Non opacification on prior CT probably secondary to injection through the port catheter. 3. The SVC stent covers the inflow from the left innominate vein. Additionally, tapered narrowing of the central left innominate vein just proximal to its confluence with the SVC suggests  involvement by the patient's known recurrent tumor. Although this region responded to 10 mm balloon angioplasty, she remains at risk for recurrence of thrombosis and should probably REMAIN ON LIFELONG ANTICOAGULATION if tolerated. 4. Mild proximal SVC stent stenosis, with good response to 10 mm balloon angioplasty.   Electronically Signed   By: Lucrezia Europe M.D.   On: 12/01/2014 12:54   Ir US Guide Vasc Access Left  12/01/2014   INDICATION: Right SVC clot and left arm brachial vein clot. 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 12 mm SVC stent placed 10/17/2014.  EXAM: THROMBOECTOMY MECHANICAL VENOUS; PTA VENOUS; LEFT EXTREMITY VENOGRAPHY; IR ULTRASOUND GUIDANCE VASC ACCESS RIGHT; IR ULTRASOUND GUIDANCE VASC ACCESS LEFT; SUPERIOR VENA CAVOGRAM  COMPARISON:  CT 11/30/2014 and earlier studies  MEDICATIONS: Intravenous Fentanyl and Versed were administered as conscious sedation during continuous cardiorespiratory monitoring by the radiology RN, with a total moderate sedation time of 60 minutes.  CONTRAST:  36mL OMNIPAQUE IOHEXOL 300 MG/ML  SOLN  FLUOROSCOPY TIME:  5 minutes.  42 seconds.  COMPLICATIONS: None immediate  TECHNIQUE: Informed written consent was obtained from the patient after a discussion of the risks, benefits and alternatives to treatment. Questions regarding the procedure were encouraged and answered. A timeout was performed prior to the initiation of the procedure.  Survey ultrasound of the left upper extremity was performed. The brachial DVT was localized and a more peripheral patent segment of the brachial vein was identified for access. Overlying skin site was marked.  The site was prepped and draped in the usual sterile fashion, and a sterile drape was applied covering the operative field. Maximum barrier sterile technique  with sterile gowns and gloves were used for the procedure. A timeout was performed prior to the initiation of the procedure.  Local anesthesia was provided with 1% lidocaine.  Under real-time ultrasound guidance, access to the left brachial vein was achieved with a micropuncture set. Left upper extremity venography was performed. The dilator was exchanged over a Bentson for a 6 [place] vascular sheath, through which a 5 [place] Kumpe catheter was advanced to the SVC. This was exchanged for a Angiojet catheter, which allowed pulse spray delivery of 10 mg tPA in100 mL sterile saline through the left upper extremity, subclavian and innominate vein clot. The Angiojet catheter would not advance across the stent into the SVC, so right IJ access was achieved. As before, the region was prepped and draped in usual sterile fashion and ultrasound-guided micropuncture access to the right IJ was achieved. Venography was performed. After the TPA had been allowed least 20 minutes post pulse spray, the Angiojet was again utilized in thrombectomy mode to extract the thrombus from the left upper extremity, subclavian and innominate veins. Residual occlusive thrombus in the central left subclavian and innominate vein was treated with additional passes of the Angiojet thrombectomy catheter. This was exchanged for a 10 mm Mustang balloon, advanced to the confluence of the left innominate vein and SVC for dilatation of a persistent stenosis. In similar fashion, the 10 mm balloon was advanced via the right IJ access for PTA of a proximal SVC stenosis. Final venography at both sites was performed. The catheters, guidewires and sheaths were removed and hemostasis achieved at the site with manual compression. The patient tolerated the procedure well.  FINDINGS: Left upper extremity venography demonstrated occlusive clot in the central aspect of the left brachial vein extending through the axillary vein, left subclavian vein and left innominate vein. There is tapered narrowing of the innominate vein near its confluence with the SVC. There was non opacification of the  SVC. The left upper extremity, subclavian, and innominate thrombus was treated with mechanical thrombectomy after pulse spray of tPA as detailed above. No significant residual clot on final venography.  Right IJ venography demonstrated patency of the SVC. The stent extends from the distal right innominate vein to the SVC, covering the inflow of the left innominate vein. No collateral filling was identified. There is mild narrowing of the stent at the confluence of the innominate veins.  The central left innominate vein stenosis responded well to 10 mm balloon angioplasty. Follow-up venography showed significant improvement in diameter of the vein at its confluence with the SVC at the level of the sidewall stent. The proximal SVC stenosis responded readily to 10 mm balloon angioplasty and follow-up venography showed no extravasation, residual or recurrent stenosis.  IMPRESSION: 1. Extensive occlusive left upper extremity, subclavian, and innominate vein DVT, with successful TPA assisted mechanical thrombectomy. 2. The SVC stent remains patent. Non opacification on prior CT probably secondary to injection through the port catheter. 3. The SVC stent covers the inflow from the left innominate vein. Additionally, tapered narrowing of the central left innominate vein just proximal to its confluence with the SVC suggests involvement by the patient's known recurrent tumor. Although this region responded to 10 mm balloon angioplasty, she remains at risk for recurrence of thrombosis and should probably REMAIN ON LIFELONG ANTICOAGULATION if tolerated. 4. Mild proximal SVC stent stenosis, with good response to 10 mm balloon angioplasty.   Electronically Signed   By: Lucrezia Europe M.D.   On: 12/01/2014 12:54   Ir  US Guide Vasc Access Right  12/01/2014   INDICATION: Right SVC clot and left arm brachial vein clot. 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 12 mm SVC stent placed  10/17/2014.  EXAM: THROMBOECTOMY MECHANICAL VENOUS; PTA VENOUS; LEFT EXTREMITY VENOGRAPHY; IR ULTRASOUND GUIDANCE VASC ACCESS RIGHT; IR ULTRASOUND GUIDANCE VASC ACCESS LEFT; SUPERIOR VENA CAVOGRAM  COMPARISON:  CT 11/30/2014 and earlier studies  MEDICATIONS: Intravenous Fentanyl and Versed were administered as conscious sedation during continuous cardiorespiratory monitoring by the radiology RN, with a total moderate sedation time of 60 minutes.  CONTRAST:  22mL OMNIPAQUE IOHEXOL 300 MG/ML  SOLN  FLUOROSCOPY TIME:  5 minutes.  42 seconds.  COMPLICATIONS: None immediate  TECHNIQUE: Informed written consent was obtained from the patient after a discussion of the risks, benefits and alternatives to treatment. Questions regarding the procedure were encouraged and answered. A timeout was performed prior to the initiation of the procedure.  Survey ultrasound of the left upper extremity was performed. The brachial DVT was localized and a more peripheral patent segment of the brachial vein was identified for access. Overlying skin site was marked.  The site was prepped and draped in the usual sterile fashion, and a sterile drape was applied covering the operative field. Maximum barrier sterile technique with sterile gowns and gloves were used for the procedure. A timeout was performed prior to the initiation of the procedure. Local anesthesia was provided with 1% lidocaine.  Under real-time ultrasound guidance, access to the left brachial vein was achieved with a micropuncture set. Left upper extremity venography was performed. The dilator was exchanged over a Bentson for a 6 [place] vascular sheath, through which a 5 [place] Kumpe catheter was advanced to the SVC. This was exchanged for a Angiojet catheter, which allowed pulse spray delivery of 10 mg tPA in100 mL sterile saline through the left upper extremity, subclavian and innominate vein clot. The Angiojet catheter would not advance across the stent into the SVC, so  right IJ access was achieved. As before, the region was prepped and draped in usual sterile fashion and ultrasound-guided micropuncture access to the right IJ was achieved. Venography was performed. After the TPA had been allowed least 20 minutes post pulse spray, the Angiojet was again utilized in thrombectomy mode to extract the thrombus from the left upper extremity, subclavian and innominate veins. Residual occlusive thrombus in the central left subclavian and innominate vein was treated with additional passes of the Angiojet thrombectomy catheter. This was exchanged for a 10 mm Mustang balloon, advanced to the confluence of the left innominate vein and SVC for dilatation of a persistent stenosis. In similar fashion, the 10 mm balloon was advanced via the right IJ access for PTA of a proximal SVC stenosis. Final venography at both sites was performed. The catheters, guidewires and sheaths were removed and hemostasis achieved at the site with manual compression. The patient tolerated the procedure well.  FINDINGS: Left upper extremity venography demonstrated occlusive clot in the central aspect of the left brachial vein extending through the axillary vein, left subclavian vein and left innominate vein. There is tapered narrowing of the innominate vein near its confluence with the SVC. There was non opacification of the SVC. The left upper extremity, subclavian, and innominate thrombus was treated with mechanical thrombectomy after pulse spray of tPA as detailed above. No significant residual clot on final venography.  Right IJ venography demonstrated patency of the SVC. The stent extends from the distal  right innominate vein to the SVC, covering the inflow of the left innominate vein. No collateral filling was identified. There is mild narrowing of the stent at the confluence of the innominate veins.  The central left innominate vein stenosis responded well to 10 mm balloon angioplasty. Follow-up venography  showed significant improvement in diameter of the vein at its confluence with the SVC at the level of the sidewall stent. The proximal SVC stenosis responded readily to 10 mm balloon angioplasty and follow-up venography showed no extravasation, residual or recurrent stenosis.  IMPRESSION: 1. Extensive occlusive left upper extremity, subclavian, and innominate vein DVT, with successful TPA assisted mechanical thrombectomy. 2. The SVC stent remains patent. Non opacification on prior CT probably secondary to injection through the port catheter. 3. The SVC stent covers the inflow from the left innominate vein. Additionally, tapered narrowing of the central left innominate vein just proximal to its confluence with the SVC suggests involvement by the patient's known recurrent tumor. Although this region responded to 10 mm balloon angioplasty, she remains at risk for recurrence of thrombosis and should probably REMAIN ON LIFELONG ANTICOAGULATION if tolerated. 4. Mild proximal SVC stent stenosis, with good response to 10 mm balloon angioplasty.   Electronically Signed   By: Lucrezia Europe M.D.   On: 12/01/2014 12:54   Dg Chest Port 1 View  11/30/2014   CLINICAL DATA:  Lung cancer with bilateral upper extremity edema  EXAM: PORTABLE CHEST - 1 VIEW  COMPARISON:  Chest radiograph October 09, 2014 and chest CT November 03, 2014  FINDINGS: The previously noted lung carcinoma in the right upper lobe is again present measuring approximately 4.2 x 3.4 cm. Is tumor appears to extend into the right hilum, unchanged. Lungs elsewhere clear. Heart size and pulmonary vascularity are normal. No adenopathy. Port-A-Cath tip is in the superior vena cava. No pneumothorax. There is a stent in the superior vena cava region, stable.  IMPRESSION: Persistent right upper lobe mass extending in the right hilum. Stent in superior vena cava. No new opacity. No change in cardiac silhouette.   Electronically Signed   By: Lowella Grip III M.D.   On:  11/30/2014 15:49    Micro Results      Recent Results (from the past 240 hour(s))  MRSA PCR Screening     Status: None   Collection Time: 11/30/14  8:52 PM  Result Value Ref Range Status   MRSA by PCR NEGATIVE NEGATIVE Final    Comment:        The GeneXpert MRSA Assay (FDA approved for NASAL specimens only), is one component of a comprehensive MRSA colonization surveillance program. It is not intended to diagnose MRSA infection nor to guide or monitor treatment for MRSA infections.        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   Objective:   Blood pressure 107/69, pulse 101, temperature 97.5 F (36.4 C), temperature source Oral, resp. rate 18, height 5\' 7"  (1.702 m), weight 78.518 kg (173 lb 1.6 oz), SpO2 98 %.   Intake/Output Summary (Last 24 hours) at 12/06/14 1004 Last data filed at 12/06/14 0935  Gross per 24 hour  Intake   1222 ml  Output   2350 ml  Net  -1128 ml    Exam Awake Alert, Oriented x 3, No new F.N deficits, Normal affect [place].AT,PERRAL Supple Neck,No JVD, No cervical lymphadenopathy  appriciated.  Symmetrical Chest wall movement, Good air movement bilaterally, CTAB RRR,No Gallops,Rubs or new Murmurs, No Parasternal Heave +ve B.Sounds, Abd Soft, Non tender, No organomegaly appriciated, No rebound -guarding or rigidity. No Cyanosis, Clubbing or edema, No new Rash or bruise, left arm redness and swelling much improved  Data Review   CBC w Diff: Lab Results  Component Value Date   WBC 5.0 12/06/2014   WBC 5.4 11/28/2014   HGB 8.5* 12/06/2014   HGB 11.1* 11/28/2014   HCT 26.0* 12/06/2014   HCT 34.6* 11/28/2014   PLT 106* 12/06/2014   PLT 207 11/28/2014   LYMPHOPCT 19.4 11/28/2014   LYMPHOPCT 12 10/17/2014   MONOPCT 15.7* 11/28/2014   MONOPCT 7 10/17/2014   EOSPCT 1.8 11/28/2014   EOSPCT 2 10/17/2014   BASOPCT 0.9  11/28/2014   BASOPCT 1 10/17/2014    CMP: Lab Results  Component Value Date   NA 137 12/05/2014   NA 140 11/28/2014   NA 141 04/04/2012   K 3.9 12/05/2014   K 3.8 11/28/2014   K 4.7 04/04/2012   CL 101 12/05/2014   CL 101 04/06/2013   CL 100 04/04/2012   CO2 30 12/05/2014   CO2 22 11/28/2014   CO2 29 04/04/2012   BUN <5* 12/05/2014   BUN 5.6* 11/28/2014   BUN 16 04/04/2012   CREATININE 0.53 12/05/2014   CREATININE 0.8 11/28/2014   CREATININE 0.9 04/04/2012   PROT 6.9 11/28/2014   PROT 7.0 11/04/2014   PROT 7.1 04/04/2012   ALBUMIN 3.6 11/28/2014   ALBUMIN 3.4* 11/04/2014   BILITOT 0.23 11/28/2014   BILITOT 0.6 11/04/2014   BILITOT 0.50 04/04/2012   ALKPHOS 80 11/28/2014   ALKPHOS 69 11/04/2014   ALKPHOS 61 04/04/2012   AST 20 11/28/2014   AST 19 11/04/2014   AST 31 04/04/2012   ALT 18 11/28/2014   ALT 13 11/04/2014   ALT 30 04/04/2012  .   Total Time in preparing paper work, data evaluation and todays exam - 35 minutes  Thurnell Lose M.D on 12/06/2014 at 10:04 AM  [facility] Office  [phone]

## 2014-12-06 NOTE — Telephone Encounter (Signed)
THIS NOTE TO DR.Forestdale.

## 2014-12-06 NOTE — Discharge Instructions (Signed)
Follow with Primary MD [name], NP in 7 days   Get CBC, CMP, 2 view Chest X ray checked  by Primary MD next visit.    Activity: As tolerated with Full fall precautions use walker/cane & assistance as needed                Keep left arm elevated   Disposition Home     Diet: Heart Healthy Low Carb.  For Heart failure patients - Check your Weight same time everyday, if you gain over 2 pounds, or you develop in leg swelling, experience more shortness of breath or chest pain, call your Primary MD immediately. Follow Cardiac Low Salt Diet and 1.5 lit/day fluid restriction.   On your next visit with your primary care physician please Get Medicines reviewed and adjusted.   Please request your Prim.MD to go over all Hospital Tests and Procedure/Radiological results at the follow up, please get all Hospital records sent to your Prim MD by signing hospital release before you go home.   If you experience worsening of your admission symptoms, develop shortness of breath, life threatening emergency, suicidal or homicidal thoughts you must seek medical attention immediately by calling 911 or calling your MD immediately  if symptoms less severe.  You Must read complete instructions/literature along with all the possible adverse reactions/side effects for all the Medicines you take and that have been prescribed to you. Take any new Medicines after you have completely understood and accpet all the possible adverse reactions/side effects.   Do not drive, operating heavy machinery, perform activities at heights, swimming or participation in water activities or provide baby sitting services if your were admitted for syncope or siezures until you have seen by Primary MD or a Neurologist and advised to do so again.  Do not drive when taking Pain medications.    Do not take more than prescribed Pain, Sleep and Anxiety Medications  Special Instructions: If you have smoked or chewed Tobacco  in the  last 2 yrs please stop smoking, stop any regular Alcohol  and or any Recreational drug use.  Wear Seat belts while driving.   Please note  You were cared for by a hospitalist during your hospital stay. If you have any questions about your discharge medications or the care you received while you were in the hospital after you are discharged, you can call the unit and asked to speak with the hospitalist on call if the hospitalist that took care of you is not available. Once you are discharged, your primary care physician will handle any further medical issues. Please note that NO REFILLS for any discharge medications will be authorized once you are discharged, as it is imperative that you return to your primary care physician (or establish a relationship with a primary care physician if you do not have one) for your aftercare needs so that they can reassess your need for medications and monitor your lab values.

## 2014-12-06 NOTE — Progress Notes (Signed)
Referring Physician(s): TR[facility]  Subjective: Patient states her left arm pain, swelling and redness has improved a lot and she is ready to go home on oral antibiotics.   Allergies: Sulfonamide derivatives; Codeine; Dilaudid; Penicillins; and Vicodin  Medications: Prior to Admission medications   Medication Sig Start Date End Date Taking? Authorizing Provider  acetaminophen (TYLENOL) 325 MG tablet Take 650 mg by mouth every 6 (six) hours as needed for mild pain or headache.    Yes Historical Provider, MD  albuterol (PROVENTIL HFA;VENTOLIN HFA) 108 (90 BASE) MCG/ACT inhaler Inhale 1 puff into the lungs every 6 (six) hours as needed for wheezing or shortness of breath.   Yes Historical Provider, MD  alendronate (FOSAMAX) 10 MG tablet Take 10 mg by mouth daily.  03/20/14  Yes Historical Provider, MD  budesonide-formoterol (SYMBICORT) 160-4.5 MCG/ACT inhaler Inhale 2 puffs into the lungs 2 (two) times daily.   Yes Historical Provider, MD  cholecalciferol (VITAMIN D) 1000 UNITS tablet Take 1,000 Units by mouth every morning.    Yes Historical Provider, MD  cyanocobalamin 1000 MCG tablet Take 1,000 mcg by mouth every morning.    Yes Historical Provider, MD  glimepiride (AMARYL) 2 MG tablet Take 2 mg by mouth daily with breakfast.  12/21/13  Yes Historical Provider, MD  guaiFENesin (MUCINEX) 600 MG 12 hr tablet Take 1,200 mg by mouth 2 (two) times daily.   Yes Historical Provider, MD  ipratropium (ATROVENT) 0.02 % nebulizer solution Take 2.5 mLs (0.5 mg total) by nebulization 4 (four) times daily. 03/05/14  Yes Brand Males, MD  levothyroxine (SYNTHROID, LEVOTHROID) 125 MCG tablet Take 125 mcg by mouth daily before breakfast.    Yes Historical Provider, MD  lidocaine-prilocaine (EMLA) cream Apply 1 application topically as needed. Apply to port 1 hr before chemo 11/06/14  Yes Curt Bears, MD  LORazepam (ATIVAN) 1 MG tablet Take 1 mg by mouth 3 (three) times daily as needed for anxiety.    Yes  Historical Provider, MD  magnesium gluconate (MAGONATE) 500 MG tablet Take 500 mg by mouth every morning.    Yes Historical Provider, MD  metFORMIN (GLUCOPHAGE) 1000 MG tablet Take 1,000 mg by mouth 2 (two) times daily with a meal.   Yes Historical Provider, MD  Multiple Vitamin (MULTIVITAMIN WITH MINERALS) TABS tablet Take 1 tablet by mouth every morning.   Yes Historical Provider, MD  OVER THE COUNTER MEDICATION Take 1 capsule by mouth daily. MSM. For energy and joint pain   Yes Historical Provider, MD  oxyCODONE (OXY IR/ROXICODONE) 5 MG immediate release tablet Take 1 tablet (5 mg total) by mouth every 4 (four) hours as needed for moderate pain. 11/28/14  Yes Adrena E Johnson, PA-C  PROAIR HFA 108 (90 BASE) MCG/ACT inhaler INHALE 2 PUFFS INTO THE LUNGS EVERY 6 (SIX) HOURS AS NEEDED FOR WHEEZING OR SHORTNESS OF BREATH. 10/08/14  Yes Brand Males, MD  prochlorperazine (COMPAZINE) 10 MG tablet Take 1 tablet (10 mg total) by mouth every 6 (six) hours as needed for nausea or vomiting. 11/28/14  Yes Adrena E Johnson, PA-C  sertraline (ZOLOFT) 100 MG tablet Take 150 mg by mouth at bedtime.    Yes Historical Provider, MD  spironolactone (ALDACTONE) 25 MG tablet Take 25 mg by mouth every morning.    Yes Historical Provider, MD  aspirin 81 MG tablet Take 1 tablet (81 mg total) by mouth daily with breakfast. 12/06/14   Thurnell Lose, MD  clindamycin (CLEOCIN) 300 MG capsule Take 2 capsules (600  mg total) by mouth 3 (three) times daily. 12/06/14   Thurnell Lose, MD  docusate sodium 100 MG CAPS Take 100 mg by mouth 2 (two) times daily. Patient not taking: Reported on 11/28/2014 11/05/14   Reyne Dumas, MD  enoxaparin (LOVENOX) 120 MG/0.8ML injection Inject 0.8 mLs (120 mg total) into the skin daily. 12/06/14   Thurnell Lose, MD  saccharomyces boulardii (FLORASTOR) 250 MG capsule Take 1 capsule (250 mg total) by mouth 2 (two) times daily. 12/06/14   Thurnell Lose, MD     Vital Signs: BP 107/69 mmHg   Pulse 101  Temp(Src) 97.5 F (36.4 C) (Oral)  Resp 18  Ht 5\' 7"  (1.702 m)  Wt 173 lb 1.6 oz (78.518 kg)  BMI 27.11 kg/m2  SpO2 98%  Physical Exam General: A&Ox3, NAD, lying in bed Ext: LUE swelling 1+ around [facility] area and erythema improving, access site with no oozing or hematoma, brachial and radial pulses intact, (+) warmth, sensation intact  Imaging: Dg Chest 2 View  12/05/2014   CLINICAL DATA:  Cough for several days  EXAM: CHEST  2 VIEW  COMPARISON:  11/30/2014  FINDINGS: Cardiac shadow is stable. Persistent changes of right-sided lung neoplasm are seen. A right chest wall port is again noted. SVC stent is again identified. No new focal infiltrate or sizable effusion is seen.  IMPRESSION: Stable appearance from the previous exam. No acute abnormality is noted.   Electronically Signed   By: Inez Catalina M.D.   On: 12/05/2014 09:15   [place] Extrem Up Left Ltd  12/04/2014   CLINICAL DATA:  Evaluate cellulitis left antecubital fossa, clot removed 12/01/2014, redness and swelling left arm  EXAM: ULTRASOUND LEFT UPPER EXTREMITY LIMITED  TECHNIQUE: Ultrasound examination of the upper extremity soft tissues was performed in the area of clinical concern.  COMPARISON:  None  FINDINGS: There is severe edematous change in the soft tissues. There is no focal fluid collection.  IMPRESSION: Findings consistent with severe cellulitis.  No abscess identified.   Electronically Signed   By: Skipper Cliche M.D.   On: 12/04/2014 17:07    Labs:  CBC:  Recent Labs  12/03/14 0042 12/03/14 0754 12/04/14 0430 12/05/14 0500 12/06/14 0455  WBC 5.4  --  4.5 2.1* 5.0  HGB 8.6* 8.9* 8.3* 8.3* 8.5*  HCT 26.5* 27.7* 26.0* 25.4* 26.0*  PLT 181  --  169 117* 106*    COAGS:  Recent Labs  10/17/14 1110 11/30/14 1444  INR 1.11 1.14  APTT 29 30    BMP:  Recent Labs  11/04/14 0435  11/28/14 1320 11/30/14 1450 12/02/14 0447 12/05/14 0500  NA 135  < > 140 137 135 137  K 3.8  < > 3.8 4.0 3.7 3.9    CL 100  --   --  102 97 101  CO2 27  < > 22 26 30 30   GLUCOSE 173*  < > 219* 178* 173* 141*  BUN 8  < > 5.6* 7 <5* <5*  CALCIUM 8.2*  < > 8.3* 8.1* 7.9* 8.2*  CREATININE 0.61  < > 0.8 0.59 0.59 0.53  GFRNONAA >90  --   --  >90 >90 >90  GFRAA >90  --   --  >90 >90 >90  < > = values in this interval not displayed.  LIVER FUNCTION TESTS:  Recent Labs  11/07/14 1205 11/14/14 1354 11/21/14 1245 11/28/14 1320  BILITOT 0.27 0.33 0.25 0.23  AST 24 23 23  20  ALT 13 21 25 18   ALKPHOS 77 75 73 80  PROT 7.3 6.8 6.7 6.9  ALBUMIN 3.4* 3.3* 3.3* 3.6    Assessment and Plan: SVC Syndrome  S/p LUE and SVC venogram with TPA assisted mechanical thrombectomy and PTA of left innominate vein and SVC stenosis, on Lovenox now History of SVC stent 10/17/14 History of recurrent non-small cell lung cancer RUL. Cellulitis of LUE with swelling and erythema improving, now on vancomycin will be switched to oral antibiotics.  Repeat US venous duplex 2/17 with no DVT or superficial thrombosis Will be discharged today and F/U with IR clinic next week, our office will call patient  Signed: Hedy Jacob 12/06/2014, 10:42 AM   I spent a total of 15 Minutes in face to face in clinical consultation/evaluation, greater than 50% of which was counseling/coordinating care for LUE DVT lysis and cellulitis.

## 2014-12-06 NOTE — Telephone Encounter (Signed)
lvm for pt regarding to Feb appt

## 2014-12-09 ENCOUNTER — Other Ambulatory Visit: Payer: Self-pay | Admitting: Radiology

## 2014-12-09 ENCOUNTER — Telehealth: Payer: Self-pay | Admitting: *Deleted

## 2014-12-09 ENCOUNTER — Telehealth: Payer: Self-pay | Admitting: Medical Oncology

## 2014-12-09 DIAGNOSIS — I871 Compression of vein: Secondary | ICD-10-CM

## 2014-12-09 NOTE — Telephone Encounter (Signed)
I left a message for Pt to keep appt 2/24.

## 2014-12-09 NOTE — Telephone Encounter (Signed)
-----   Message from Curt Bears, MD sent at 12/06/2014 10:17 PM EST ----- Regarding: RE: hosp f/u 2/24 is ok. ----- Message -----    From: Genia Plants, RN    Sent: 12/06/2014  11:34 AM      To: Curt Bears, MD, Ardeen Garland, RN Subject: hosp f/u                                       Pt has been scheduled for a hosp f/u on 2/24 with [facility].  The hospital called wanting pt to be seen 3 days after hosp d/c.  Is 2/24 ok?  If not she would probably need to be scheduled with [facility] on an earlier date.  Diane can you f/u?   Thanks

## 2014-12-09 NOTE — Telephone Encounter (Signed)
Patient called reporting she "needs to schedule an appointment per her Hospital discharge instructions.  I also want him to look at  My chest because it's as bruised like before I had my stent."  Noted F/U scheduled for 12-11-2014 beginning with lab at 1:45 pm.  Appointment information given with this call.

## 2014-12-11 ENCOUNTER — Other Ambulatory Visit (HOSPITAL_BASED_OUTPATIENT_CLINIC_OR_DEPARTMENT_OTHER): Payer: Medicare Other

## 2014-12-11 ENCOUNTER — Ambulatory Visit (HOSPITAL_BASED_OUTPATIENT_CLINIC_OR_DEPARTMENT_OTHER): Payer: Medicare Other | Admitting: Nurse Practitioner

## 2014-12-11 ENCOUNTER — Encounter: Payer: Self-pay | Admitting: Nurse Practitioner

## 2014-12-11 VITALS — BP 133/59 | HR 118 | Temp 97.9°F | Resp 16 | Wt 171.4 lb

## 2014-12-11 DIAGNOSIS — I871 Compression of vein: Secondary | ICD-10-CM

## 2014-12-11 DIAGNOSIS — I82622 Acute embolism and thrombosis of deep veins of left upper extremity: Secondary | ICD-10-CM

## 2014-12-11 DIAGNOSIS — C3411 Malignant neoplasm of upper lobe, right bronchus or lung: Secondary | ICD-10-CM

## 2014-12-11 DIAGNOSIS — Z7901 Long term (current) use of anticoagulants: Secondary | ICD-10-CM

## 2014-12-11 DIAGNOSIS — C3491 Malignant neoplasm of unspecified part of right bronchus or lung: Secondary | ICD-10-CM

## 2014-12-11 DIAGNOSIS — Z0189 Encounter for other specified special examinations: Secondary | ICD-10-CM

## 2014-12-11 LAB — CBC WITH DIFFERENTIAL/PLATELET
BASO%: 0.5 % (ref 0.0–2.0)
Basophils Absolute: 0 10*3/uL (ref 0.0–0.1)
EOS ABS: 0.1 10*3/uL (ref 0.0–0.5)
EOS%: 1.6 % (ref 0.0–7.0)
HEMATOCRIT: 33.2 % — AB (ref 34.8–46.6)
HGB: 10.5 g/dL — ABNORMAL LOW (ref 11.6–15.9)
LYMPH%: 11.7 % — ABNORMAL LOW (ref 14.0–49.7)
MCH: 27.4 pg (ref 25.1–34.0)
MCHC: 31.6 g/dL (ref 31.5–36.0)
MCV: 86.7 fL (ref 79.5–101.0)
MONO#: 0.6 10*3/uL (ref 0.1–0.9)
MONO%: 11.2 % (ref 0.0–14.0)
NEUT%: 75 % (ref 38.4–76.8)
NEUTROS ABS: 4.2 10*3/uL (ref 1.5–6.5)
PLATELETS: 237 10*3/uL (ref 145–400)
RBC: 3.83 10*6/uL (ref 3.70–5.45)
RDW: 21.4 % — ABNORMAL HIGH (ref 11.2–14.5)
WBC: 5.6 10*3/uL (ref 3.9–10.3)
lymph#: 0.7 10*3/uL — ABNORMAL LOW (ref 0.9–3.3)

## 2014-12-11 LAB — COMPREHENSIVE METABOLIC PANEL (CC13)
ALBUMIN: 3.3 g/dL — AB (ref 3.5–5.0)
ALK PHOS: 79 U/L (ref 40–150)
ALT: 19 U/L (ref 0–55)
AST: 24 U/L (ref 5–34)
Anion Gap: 12 mEq/L — ABNORMAL HIGH (ref 3–11)
BUN: 7.5 mg/dL (ref 7.0–26.0)
CALCIUM: 8.5 mg/dL (ref 8.4–10.4)
CHLORIDE: 102 meq/L (ref 98–109)
CO2: 25 mEq/L (ref 22–29)
Creatinine: 0.9 mg/dL (ref 0.6–1.1)
EGFR: 74 mL/min/{1.73_m2} — ABNORMAL LOW (ref >90)
Glucose: 300 mg/dl — ABNORMAL HIGH (ref 70–140)
POTASSIUM: 4.2 meq/L (ref 3.5–5.1)
SODIUM: 138 meq/L (ref 136–145)
TOTAL PROTEIN: 6.9 g/dL (ref 6.4–8.3)
Total Bilirubin: 0.36 mg/dL (ref 0.20–1.20)

## 2014-12-11 NOTE — Progress Notes (Addendum)
[place] [place] OFFICE PROGRESS NOTE   Diagnosis:  Recurrent non-small cell lung cancer, squamous cell carcinoma initially diagnosis stage IIIa (T1 N2 MX ) in August of 2010.    INTERVAL HISTORY:   Janice Ball returns for follow-up. She completed cycle 1 carboplatin/gemcitabine on 11/07/2014. Carboplatin was held cycle 2 day one (11/28/2014) due to a positive skin test. She received gemcitabine alone.  On 11/29/2014 she was found to have a left upper extremity DVT and was started on Lovenox.  She was hospitalized on 11/30/2014 with neck swelling and shortness of breath. CT chest showed the superior vena cava stent had thrombosed since 11/03/2014. There was continued narrowing of the right upper lobe pulmonary arteries by mediastinal tumor. Tumor bulk was stable to slightly regressed since December.  Left upper extremity venography 12/01/2014 showed occlusive clot in the central aspect of the left brachial vein extending to the axillary vein, left subclavian vein and left innominate vein. Tapered narrowing of the innominate vein near its confluence with the SVC. Non-opacification of the SVC. Left upper extremity, subclavian and innominate thrombosis was treated with mechanical thrombectomy with no significant residual clot on final venography.   Right IJ venography 12/01/2014 demonstrated patency of the SVC. There was mild narrowing of the stent at the confluence of the innominate veins. Balloon angioplasty was performed with follow-up venography showing significant improvement.  She developed cellulitis of the left arm which was treated with 3 days of IV vancomycin with improvement. She was transitioned to oral antibiotic at discharge. She was discharged home on 12/06/2014.  She notes less edema and erythema of the left arm. She continues the oral antibiotic. She denies fever. Stable dyspnea on exertion. Periodic cough. The cough is productive at times. No hemoptysis. Appetite  varies.  Objective:  Vital signs in last 24 hours:  Blood pressure 133/59, pulse 118, temperature 97.9 F (36.6 C), temperature source Oral, resp. rate 16, weight 171 lb 6.4 oz (77.747 kg), SpO2 97 %. repeat heart rate 112    HEENT: No thrush or ulcers. Resp: Lungs clear bilaterally. Cardio: Regular, mildly tachycardic.  GI: Abdomen soft and nontender. No hepatomegaly. Vascular: No leg edema. Left arm appears slightly larger than the right arm. Venous engorgement over the anterior chest and bilateral upper arms. Neuro: Alert and oriented. Gait normal.  Port-A-Cath without erythema.    Lab Results:  Lab Results  Component Value Date   WBC 5.6 12/11/2014   HGB 10.5* 12/11/2014   HCT 33.2* 12/11/2014   MCV 86.7 12/11/2014   PLT 237 12/11/2014   NEUTROABS 4.2 12/11/2014    Imaging:  No results found.  Medications: I have reviewed the patient's current medications.  Assessment/Plan: 1. Recurrent non-small cell lung cancer with initial diagnosis dating to August 2010. Treated with multiple agents in the past. She most recently began carboplatin/gemcitabine on 11/07/2014. On 11/28/2014 with cycle 2, the carboplatin was held due to a positive skin test. She received gemcitabine alone. 2. Positive carboplatin skin test 11/28/2014.  3. 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 4. Acute deep vein thrombosis involving left brachial vein 11/29/2014. Lovenox initiated. 5. Chest CT 11/30/2014 showed the superior vena cava stent had thrombosed since 11/03/2014. Continued narrowing of the right upper lobe pulmonary arteries by mediastinal tumor. Tumor bulk stable to slightly regressed. 6. Left upper extremity venography 12/01/2014 showed occlusive clot in the central aspect of the left brachial vein extending to the axillary vein, left subclavian vein and left  innominate vein. Tapered narrowing of the innominate vein near its confluence with the SVC.  Non-opacification of the SVC. The left upper extremity, subclavian and innominate thrombus was treated with mechanical thrombectomy with no significant residual clot on final venography. 7. Right IJ venography 12/01/2014 demonstrated patency of the SVC. There was mild narrowing of the stent at the confluence of the innominate veins. Balloon angioplasty was performed with follow-up venography showing significant improvement. 8. Left arm cellulitis during the recent hospitalization. Completing oral antibiotics.  Disposition: Janice Ball appears stable. We reviewed/discussed the recent hospital course. She continues Lovenox. She understands she will need to be on anticoagulation long-term. She is completing oral antibiotics for the cellulitis.   Due to the positive carboplatin skin test on 11/28/2014, carboplatin will be eliminated from the regimen. Dr. [name] recommends continuation of gemcitabine on a day 1 day 8 schedule of a 21 day cycle.  Janice Ball will return for a follow-up visit and chemotherapy on 12/19/2014. She will contact the office in the interim with any problems.  Patient seen with Dr. [name].    Ned Card ANP/GNP-BC   12/11/2014  3:24 PM  ADDENDUM: Hematology/Oncology Attending: I had a face to face encounter with the patient today. I recommended her care plan. She is a very pleasant 59 years old white female with recurrent non-small cell lung cancer and recent disease progression in the right suprahilar area and mediastinal lymphadenopathy with SVC syndrome is status post stent placement and most recently occlusion of the stent requiring balloon angioplasty and she is currently on anticoagulation with subcutaneous Lovenox. The patient is feeling a little bit better today. She is currently undergoing systemic chemotherapy with carboplatin and gemcitabine that she has positive skin test for carboplatin during the last treatment and carboplatin was discontinued from her  regimen. I recommended for the patient to continue her chemotherapy with single agent gemcitabine. She will start the next cycle in 1 week. The patient would come back for follow-up visit with the start of the next cycle of her treatment. She was advised to call immediately if she has any concerning symptoms in the interval.  Disclaimer: This note was dictated with voice recognition software. Similar sounding words can inadvertently be transcribed and may be missed upon review. Eilleen Kempf., MD 12/11/2014

## 2014-12-12 ENCOUNTER — Other Ambulatory Visit: Payer: Medicare Other

## 2014-12-12 MED ORDER — DIPHENHYDRAMINE HCL 25 MG PO CAPS
ORAL_CAPSULE | ORAL | Status: AC
  Filled 2014-12-12: qty 1

## 2014-12-12 MED ORDER — ACETAMINOPHEN 325 MG PO TABS
ORAL_TABLET | ORAL | Status: AC
  Filled 2014-12-12: qty 2

## 2014-12-19 ENCOUNTER — Telehealth: Payer: Self-pay | Admitting: Physician Assistant

## 2014-12-19 ENCOUNTER — Telehealth: Payer: Self-pay | Admitting: *Deleted

## 2014-12-19 ENCOUNTER — Ambulatory Visit (HOSPITAL_BASED_OUTPATIENT_CLINIC_OR_DEPARTMENT_OTHER): Payer: Medicare Other | Admitting: Physician Assistant

## 2014-12-19 ENCOUNTER — Other Ambulatory Visit (HOSPITAL_BASED_OUTPATIENT_CLINIC_OR_DEPARTMENT_OTHER): Payer: Medicare Other

## 2014-12-19 ENCOUNTER — Encounter: Payer: Self-pay | Admitting: Physician Assistant

## 2014-12-19 ENCOUNTER — Ambulatory Visit (HOSPITAL_BASED_OUTPATIENT_CLINIC_OR_DEPARTMENT_OTHER): Payer: Medicare Other

## 2014-12-19 VITALS — BP 138/58 | HR 119 | Temp 97.6°F | Resp 19 | Ht 67.0 in | Wt 171.7 lb

## 2014-12-19 DIAGNOSIS — C3411 Malignant neoplasm of upper lobe, right bronchus or lung: Secondary | ICD-10-CM

## 2014-12-19 DIAGNOSIS — I82622 Acute embolism and thrombosis of deep veins of left upper extremity: Secondary | ICD-10-CM

## 2014-12-19 DIAGNOSIS — Z5111 Encounter for antineoplastic chemotherapy: Secondary | ICD-10-CM

## 2014-12-19 DIAGNOSIS — Z7901 Long term (current) use of anticoagulants: Secondary | ICD-10-CM

## 2014-12-19 DIAGNOSIS — Z79899 Other long term (current) drug therapy: Secondary | ICD-10-CM

## 2014-12-19 DIAGNOSIS — C3481 Malignant neoplasm of overlapping sites of right bronchus and lung: Secondary | ICD-10-CM

## 2014-12-19 LAB — CBC WITH DIFFERENTIAL/PLATELET
BASO%: 1.5 % (ref 0.0–2.0)
Basophils Absolute: 0.1 10*3/uL (ref 0.0–0.1)
EOS ABS: 0.5 10*3/uL (ref 0.0–0.5)
EOS%: 6.1 % (ref 0.0–7.0)
HEMATOCRIT: 36.7 % (ref 34.8–46.6)
HGB: 11.8 g/dL (ref 11.6–15.9)
LYMPH%: 13.4 % — AB (ref 14.0–49.7)
MCH: 28 pg (ref 25.1–34.0)
MCHC: 32.2 g/dL (ref 31.5–36.0)
MCV: 87.2 fL (ref 79.5–101.0)
MONO#: 0.9 10*3/uL (ref 0.1–0.9)
MONO%: 10.4 % (ref 0.0–14.0)
NEUT%: 68.6 % (ref 38.4–76.8)
NEUTROS ABS: 5.7 10*3/uL (ref 1.5–6.5)
PLATELETS: 279 10*3/uL (ref 145–400)
RBC: 4.21 10*6/uL (ref 3.70–5.45)
RDW: 21.1 % — ABNORMAL HIGH (ref 11.2–14.5)
WBC: 8.2 10*3/uL (ref 3.9–10.3)
lymph#: 1.1 10*3/uL (ref 0.9–3.3)

## 2014-12-19 LAB — TSH CHCC: TSH: 9.447 m[IU]/L — AB (ref 0.308–3.960)

## 2014-12-19 MED ORDER — HEPARIN SOD (PORK) LOCK FLUSH 100 UNIT/ML IV SOLN
500.0000 [IU] | Freq: Once | INTRAVENOUS | Status: AC | PRN
  Administered 2014-12-19: 500 [IU]
  Filled 2014-12-19: qty 5

## 2014-12-19 MED ORDER — SODIUM CHLORIDE 0.9 % IV SOLN
1000.0000 mg/m2 | Freq: Once | INTRAVENOUS | Status: AC
  Administered 2014-12-19: 1938 mg via INTRAVENOUS
  Filled 2014-12-19: qty 50.97

## 2014-12-19 MED ORDER — ONDANSETRON 16 MG/50ML IVPB (CHCC)
INTRAVENOUS | Status: AC
  Filled 2014-12-19: qty 16

## 2014-12-19 MED ORDER — SODIUM CHLORIDE 0.9 % IJ SOLN
10.0000 mL | INTRAMUSCULAR | Status: DC | PRN
  Administered 2014-12-19: 10 mL
  Filled 2014-12-19: qty 10

## 2014-12-19 MED ORDER — ONDANSETRON 16 MG/50ML IVPB (CHCC)
16.0000 mg | Freq: Once | INTRAVENOUS | Status: AC
  Administered 2014-12-19: 16 mg via INTRAVENOUS

## 2014-12-19 MED ORDER — SODIUM CHLORIDE 0.9 % IV SOLN
Freq: Once | INTRAVENOUS | Status: AC
  Administered 2014-12-19: 15:00:00 via INTRAVENOUS

## 2014-12-19 MED ORDER — OXYCODONE HCL 5 MG PO TABS: 5.0000 mg | ORAL_TABLET | ORAL | Status: DC | PRN

## 2014-12-19 MED ORDER — PROCHLORPERAZINE MALEATE 10 MG PO TABS: 10.0000 mg | ORAL_TABLET | Freq: Four times a day (QID) | ORAL | Status: DC | PRN

## 2014-12-19 MED ORDER — CARBOPLATIN CHEMO INTRADERMAL TEST DOSE 100MCG/0.02ML: 100.0000 ug | Freq: Once | INTRADERMAL | Status: DC

## 2014-12-19 MED ORDER — DEXAMETHASONE SODIUM PHOSPHATE 20 MG/5ML IJ SOLN
20.0000 mg | Freq: Once | INTRAMUSCULAR | Status: AC
Start: 2014-12-19 — End: 2014-12-19
  Administered 2014-12-19: 20 mg via INTRAVENOUS

## 2014-12-19 MED ORDER — DEXAMETHASONE SODIUM PHOSPHATE 20 MG/5ML IJ SOLN
INTRAMUSCULAR | Status: AC
  Filled 2014-12-19: qty 5

## 2014-12-19 NOTE — Telephone Encounter (Signed)
Gave avs & calendar for March. °

## 2014-12-19 NOTE — Telephone Encounter (Signed)
Ihave adjsuted 3/24 appt

## 2014-12-19 NOTE — Patient Instructions (Signed)
[facility] Discharge Instructions for Patients Receiving Chemotherapy  Today you received the following chemotherapy agents: Gemzar.  To help prevent nausea and vomiting after your treatment, we encourage you to take your nausea medication: Compazine. Take one every 6 hours as needed.   If you develop nausea and vomiting that is not controlled by your nausea medication, call the clinic.   BELOW ARE SYMPTOMS THAT SHOULD BE REPORTED IMMEDIATELY:  *FEVER GREATER THAN 100.5 F  *CHILLS WITH OR WITHOUT FEVER  NAUSEA AND VOMITING THAT IS NOT CONTROLLED WITH YOUR NAUSEA MEDICATION  *UNUSUAL SHORTNESS OF BREATH  *UNUSUAL BRUISING OR BLEEDING  TENDERNESS IN MOUTH AND THROAT WITH OR WITHOUT PRESENCE OF ULCERS  *URINARY PROBLEMS  *BOWEL PROBLEMS  UNUSUAL RASH Items with * indicate a potential emergency and should be followed up as soon as possible.  Feel free to call the clinic should you have any questions or concerns. The clinic phone number is [phone].

## 2014-12-19 NOTE — Progress Notes (Addendum)
[place] [phone]   [phone]  OFFICE PROGRESS NOTE  [name], NP [place] [address]  DIAGNOSIS: Recurrent non-small cell lung cancer, squamous cell carcinoma initially diagnosis stage IIIa (T1 N2 MX ) in August of 2010.   PRIOR THERAPY:  #1 status post concurrent chemoradiation with weekly carboplatin and paclitaxel, last dose of chemotherapy given 08/05/2011.  #2 status post consolidation chemotherapy with carboplatin paclitaxel last dose given 10/27/2009.  #3 Concurrent chemoradiation with weekly carboplatin for an AUC of 2 and paclitaxel at 45 mg per meter squared given concurrent with radiotherapy, last dose was given 09/20/2011.  #4 Systemic chemotherapy with gemcitabine 1000 mg meter squared on days 1 and 8 every 3 weeks status post 3 cycles with stable disease, last dose was given 12/20/2011.  #5 Systemic chemotherapy again with single agent gemcitabine 1000 mg/M2 on days 1 and 8 every 3 weeks. First cycle 11/07/2013. Status post 4 cycles. #6  Systemic chemotherapy again with single agent gemcitabine 1000 mg/M2 on days 1 and 8 every 3 weeks. First dose 07/24/2014. Discontinued today secondary to intolerance. #7  Immunotherapy with Nivolumab 3 MG/KG every 2 weeks. First dose 09/18/2014. She is status post 2 cycles. Last dose was given 10/02/2014 discontinued secondary to disease progression with significant SVC syndrome.  CURRENT THERAPY: Systemic chemotherapy with carboplatin for AUC of 5 on day 1 and gemcitabine 1000 MG/M2 on days 1 and 8 every 3 weeks. First dose 11/07/2014. Status post 1 cycle. She had a reaction to the [place] skin test dose was cycle #2 carboplatin was discontinued. She proceed with cycle #2 with gemcitabine only.  INTERVAL HISTORY: Janice Ball 59 y.o. female returns to the clinic today for a follow-up visit. She presents to proceed with day 1 of cycle 3 of her systemic chemotherapy  with gemcitabine. Carboplatin was discontinued with cycle #2 due to a positive skin test dose of carboplatin. She recently was positive for left upper extremity DVT and was started on Lovenox however over the weekend she had increased swelling and underwent thrombectomy. She has significantly decreased left upper extremity edema. She continues on Lovenox. She requests refill prescriptions for her Compazine and oxycodone 5/325 mg. She was no other specific complaints today. She denied fever, chills, shortness of breath, cough or hemoptysis. Denied nausea, vomiting, diarrhea or constipation. She's had no significant weight loss or night sweats.  MEDICAL HISTORY: Past Medical History  Diagnosis Date  . Anxiety   . Hyperlipidemia   . Hypothyroidism   . COPD (chronic obstructive pulmonary disease)   . Arthritis     thumbs  . 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 Diabetes mellitus   . Lung cancer dx'd 05/2009  . Lung cancer dx'd 09/2013    recurrence in LN  . 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     ALLERGIES:  is allergic to carboplatin; sulfonamide derivatives; codeine; dilaudid; penicillins; and vicodin.  MEDICATIONS:  Current Outpatient Prescriptions  Medication Sig Dispense Refill  . acetaminophen (TYLENOL) 325 MG tablet Take 650 mg by mouth every 6 (six) hours as needed for mild pain or headache.     . albuterol (PROVENTIL HFA;VENTOLIN HFA) 108 (90 BASE) MCG/ACT inhaler Inhale 1 puff into the lungs every 6 (six) hours as needed for wheezing or shortness of breath.    Marland Kitchen alendronate (FOSAMAX) 10 MG tablet Take 10 mg by mouth daily.     Marland Kitchen  aspirin 81 MG tablet Take 1 tablet (81 mg total) by mouth daily with breakfast. 30 tablet 0  . budesonide-formoterol (SYMBICORT) 160-4.5 MCG/ACT inhaler Inhale 2 puffs into the lungs 2 (two) times daily.    . cholecalciferol (VITAMIN D) 1000 UNITS tablet Take 1,000 Units by mouth every morning.      . cyanocobalamin 1000 MCG tablet Take 1,000 mcg by mouth every morning.     . docusate sodium 100 MG CAPS Take 100 mg by mouth 2 (two) times daily. 10 capsule 0  . enoxaparin (LOVENOX) 120 MG/0.8ML injection Inject 0.8 mLs (120 mg total) into the skin daily. 30 Syringe 1  . glimepiride (AMARYL) 2 MG tablet Take 2 mg by mouth daily with breakfast.     . guaiFENesin (MUCINEX) 600 MG 12 hr tablet Take 1,200 mg by mouth 2 (two) times daily.    Marland Kitchen ipratropium (ATROVENT) 0.02 % nebulizer solution Take 2.5 mLs (0.5 mg total) by nebulization 4 (four) times daily. 150 mL 4  . levothyroxine (SYNTHROID, LEVOTHROID) 125 MCG tablet Take 125 mcg by mouth daily before breakfast.     . lidocaine-prilocaine (EMLA) cream Apply 1 application topically as needed. Apply to port 1 hr before chemo 30 g 0  . LORazepam (ATIVAN) 1 MG tablet Take 1 mg by mouth 3 (three) times daily as needed for anxiety.     . magnesium gluconate (MAGONATE) 500 MG tablet Take 500 mg by mouth every morning.     . metFORMIN (GLUCOPHAGE) 1000 MG tablet Take 1,000 mg by mouth 2 (two) times daily with a meal.    . Multiple Vitamin (MULTIVITAMIN WITH MINERALS) TABS tablet Take 1 tablet by mouth every morning.    Marland Kitchen OVER THE COUNTER MEDICATION Take 1 capsule by mouth daily. MSM. For energy and joint pain    . oxyCODONE (OXY IR/ROXICODONE) 5 MG immediate release tablet Take 1 tablet (5 mg total) by mouth every 4 (four) hours as needed for moderate pain. 60 tablet 0  . PROAIR HFA 108 (90 BASE) MCG/ACT inhaler INHALE 2 PUFFS INTO THE LUNGS EVERY 6 (SIX) HOURS AS NEEDED FOR WHEEZING OR SHORTNESS OF BREATH. 8.5 each 2  . prochlorperazine (COMPAZINE) 10 MG tablet Take 1 tablet (10 mg total) by mouth every 6 (six) hours as needed for nausea or vomiting. 30 tablet 1  . sertraline (ZOLOFT) 100 MG tablet Take 150 mg by mouth at bedtime.     Marland Kitchen spironolactone (ALDACTONE) 25 MG tablet Take 25 mg by mouth every morning.     . saccharomyces boulardii (FLORASTOR)  250 MG capsule Take 1 capsule (250 mg total) by mouth 2 (two) times daily. (Patient not taking: Reported on 12/19/2014) 30 capsule 0   No current facility-administered medications for this visit.   Facility-Administered Medications Ordered in Other Visits  Medication Dose Route Frequency Provider Last Rate Last Dose  . Gemcitabine HCl (GEMZAR) 1,938 mg in sodium chloride 0.9 % 100 mL chemo infusion  1,000 mg/m2 (Treatment Plan Actual) Intravenous Once Curt Bears, MD      . heparin lock flush 100 unit/mL  500 Units Intracatheter Once PRN Curt Bears, MD      . sodium chloride 0.9 % injection 10 mL  10 mL Intracatheter PRN Curt Bears, MD   10 mL at 11/07/14 1558  . sodium chloride 0.9 % injection 10 mL  10 mL Intracatheter PRN Curt Bears, MD        SURGICAL HISTORY:  Past Surgical History  Procedure Laterality Date  .  Thyroidectomy, partial    . Tonsillectomy    . Tubal ligation    . Partial hysterectomy      REVIEW OF SYSTEMS:  Constitutional: positive for fatigue Eyes: negative Ears, nose, mouth, throat, and face: negative Respiratory: negative Cardiovascular: negative Gastrointestinal: negative Genitourinary:negative Integument/breast: negative Hematologic/lymphatic: negative Musculoskeletal:positive for Decreased swelling and warmth of the left upper extremity extending into the hand Neurological: negative Behavioral/Psych: negative Endocrine: negative Allergic/Immunologic: negative   PHYSICAL EXAMINATION: General appearance: alert, cooperative and no distress Head: Normocephalic, without obvious abnormality, atraumatic Neck: no adenopathy, no JVD, supple, symmetrical, trachea midline and thyroid not enlarged, symmetric, no tenderness/mass/nodules Lymph nodes: Cervical, supraclavicular, and axillary nodes normal. Resp: wheezes bilaterally and Minimal Back: symmetric, no curvature. ROM normal. No CVA tenderness. Cardio: regular rate and rhythm, S1, S2  normal, no murmur, click, rub or gallop GI: soft, non-tender; bowel sounds normal; no masses,  no organomegaly Extremities: edema Trace to 1++ edema left upper extremity with decreased warmth and tenderness particularly in the upper biceps area Neurologic: Alert and oriented X 3, normal strength and tone. Normal symmetric reflexes. Normal coordination and gait  ECOG PERFORMANCE STATUS: 1 - Symptomatic but completely ambulatory  Blood pressure 138/58, pulse 119, temperature 97.6 F (36.4 C), temperature source Oral, resp. rate 19, height 5\' 7"  (1.702 m), weight 171 lb 11.2 oz (77.883 kg), SpO2 97 %.  LABORATORY DATA: Lab Results  Component Value Date   WBC 8.2 12/19/2014   HGB 11.8 12/19/2014   HCT 36.7 12/19/2014   MCV 87.2 12/19/2014   PLT 279 12/19/2014      Chemistry      Component Value Date/Time   NA 138 12/11/2014 1337   NA 137 12/05/2014 0500   NA 141 04/04/2012 0945   K 4.2 12/11/2014 1337   K 3.9 12/05/2014 0500   K 4.7 04/04/2012 0945   CL 101 12/05/2014 0500   CL 101 04/06/2013 1041   CL 100 04/04/2012 0945   CO2 25 12/11/2014 1337   CO2 30 12/05/2014 0500   CO2 29 04/04/2012 0945   BUN 7.5 12/11/2014 1337   BUN <5* 12/05/2014 0500   BUN 16 04/04/2012 0945   CREATININE 0.9 12/11/2014 1337   CREATININE 0.53 12/05/2014 0500   CREATININE 0.9 04/04/2012 0945      Component Value Date/Time   CALCIUM 8.5 12/11/2014 1337   CALCIUM 8.2* 12/05/2014 0500   CALCIUM 8.4 04/04/2012 0945   ALKPHOS 79 12/11/2014 1337   ALKPHOS 69 11/04/2014 0435   ALKPHOS 61 04/04/2012 0945   AST 24 12/11/2014 1337   AST 19 11/04/2014 0435   AST 31 04/04/2012 0945   ALT 19 12/11/2014 1337   ALT 13 11/04/2014 0435   ALT 30 04/04/2012 0945   BILITOT 0.36 12/11/2014 1337   BILITOT 0.6 11/04/2014 0435   BILITOT 0.50 04/04/2012 0945       RADIOGRAPHIC STUDIES: Dg Chest 2 View  12/05/2014   CLINICAL DATA:  Cough for several days  EXAM: CHEST  2 VIEW  COMPARISON:  11/30/2014   FINDINGS: Cardiac shadow is stable. Persistent changes of right-sided lung neoplasm are seen. A right chest wall port is again noted. SVC stent is again identified. No new focal infiltrate or sizable effusion is seen.  IMPRESSION: Stable appearance from the previous exam. No acute abnormality is noted.   Electronically Signed   By: Inez Catalina M.D.   On: 12/05/2014 09:15   Ct Angio Chest Pe W/cm &/or Wo Cm  11/30/2014  ADDENDUM REPORT: 11/30/2014 16:59  ADDENDUM: Critical Value/emergent results were called by telephone at the time of interpretation on 11/30/2014 at 4:56 pm to Dr. Noemi Chapel , who verbally acknowledged these results.   Electronically Signed   By: Genevie Ann M.D.   On: 11/30/2014 16:59   11/30/2014   CLINICAL DATA:  59 year old female with shortness of breath. Positive left upper extremity DVT. History of lung cancer and COPD.   Initial encounter.  EXAM: CT ANGIOGRAPHY CHEST WITH CONTRAST  TECHNIQUE: Multidetector CT imaging of the chest was performed using the standard protocol during bolus administration of intravenous contrast. Multiplanar CT image reconstructions and MIPs were obtained to evaluate the vascular anatomy.  CONTRAST:  179mL OMNIPAQUE IOHEXOL 350 MG/ML SOLN  COMPARISON:  Chest CTA 10/09/2014.  Common chest CTA 11/03/2014.  FINDINGS: Good contrast bolus timing in the pulmonary arterial tree. No pulmonary artery filling defect consistent with pulmonary embolus.  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 The right chest porta cath was injected common the catheter passed through a vascular stent of the SVC, which does also thrombosis (series 6, image 104). Some of the right upper lobe pulmonary arteries are significantly narrowed by tumor (image when 18).  At the same time mediastinal tumor bulk has slightly regressed since 10/09/2014 (such as on series 4, image 35 measuring 48 x 43 mm canal  versus 57 x 59 mm at a comparable level previously).  No pericardial or pleural effusion. Narrowing of the carina and central right lung airways has not significantly changed. Spiculated peribronchovascular opacity in the right upper lobe is stable to slightly regressed. No areas of worsening ventilation.  Stable visualized aorta and great vessels with atherosclerosis. Stable visualized upper abdominal viscera. No acute osseous abnormality identified.  Review of the MIP images confirms the above findings.  IMPRESSION: 1. 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 2. Tumor bulky is stable to slightly regressed since December. 3. No new chest abnormality identified.  Electronically Signed: By: Genevie Ann M.D. On: 11/30/2014 16:53   Ir Veno/ext/uni Left  12/01/2014   INDICATION: Right SVC clot and left arm brachial vein clot. 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 12 mm SVC stent placed 10/17/2014.  EXAM: THROMBOECTOMY MECHANICAL VENOUS; PTA VENOUS; LEFT EXTREMITY VENOGRAPHY; IR ULTRASOUND GUIDANCE VASC ACCESS RIGHT; IR ULTRASOUND GUIDANCE VASC ACCESS LEFT; SUPERIOR VENA CAVOGRAM  COMPARISON:  CT 11/30/2014 and earlier studies  MEDICATIONS: Intravenous Fentanyl and Versed were administered as conscious sedation during continuous cardiorespiratory monitoring by the radiology RN, with a total moderate sedation time of 60 minutes.  CONTRAST:  74mL OMNIPAQUE IOHEXOL 300 MG/ML  SOLN  FLUOROSCOPY TIME:  5 minutes.  42 seconds.  COMPLICATIONS: None immediate  TECHNIQUE: Informed written consent was obtained from the patient after a discussion of the risks, benefits and alternatives to treatment. Questions regarding the procedure were encouraged and answered. A timeout was performed prior to the initiation of  the procedure.  Survey ultrasound of the left upper extremity was performed. The brachial DVT was localized and a more peripheral patent segment of the brachial vein was identified for access. Overlying skin site was marked.  The site was prepped and draped in the usual sterile fashion, and a sterile drape  was applied covering the operative field. Maximum barrier sterile technique with sterile gowns and gloves were used for the procedure. A timeout was performed prior to the initiation of the procedure. Local anesthesia was provided with 1% lidocaine.  Under real-time ultrasound guidance, access to the left brachial vein was achieved with a micropuncture set. Left upper extremity venography was performed. The dilator was exchanged over a Bentson for a 6 [place] vascular sheath, through which a 5 [place] Kumpe catheter was advanced to the SVC. This was exchanged for a Angiojet catheter, which allowed pulse spray delivery of 10 mg tPA in100 mL sterile saline through the left upper extremity, subclavian and innominate vein clot. The Angiojet catheter would not advance across the stent into the SVC, so right IJ access was achieved. As before, the region was prepped and draped in usual sterile fashion and ultrasound-guided micropuncture access to the right IJ was achieved. Venography was performed. After the TPA had been allowed least 20 minutes post pulse spray, the Angiojet was again utilized in thrombectomy mode to extract the thrombus from the left upper extremity, subclavian and innominate veins. Residual occlusive thrombus in the central left subclavian and innominate vein was treated with additional passes of the Angiojet thrombectomy catheter. This was exchanged for a 10 mm Mustang balloon, advanced to the confluence of the left innominate vein and SVC for dilatation of a persistent stenosis. In similar fashion, the 10 mm balloon was advanced via the right IJ access for PTA of a proximal SVC stenosis. Final  venography at both sites was performed. The catheters, guidewires and sheaths were removed and hemostasis achieved at the site with manual compression. The patient tolerated the procedure well.  FINDINGS: Left upper extremity venography demonstrated occlusive clot in the central aspect of the left brachial vein extending through the axillary vein, left subclavian vein and left innominate vein. There is tapered narrowing of the innominate vein near its confluence with the SVC. There was non opacification of the SVC. The left upper extremity, subclavian, and innominate thrombus was treated with mechanical thrombectomy after pulse spray of tPA as detailed above. No significant residual clot on final venography.  Right IJ venography demonstrated patency of the SVC. The stent extends from the distal right innominate vein to the SVC, covering the inflow of the left innominate vein. No collateral filling was identified. There is mild narrowing of the stent at the confluence of the innominate veins.  The central left innominate vein stenosis responded well to 10 mm balloon angioplasty. Follow-up venography showed significant improvement in diameter of the vein at its confluence with the SVC at the level of the sidewall stent. The proximal SVC stenosis responded readily to 10 mm balloon angioplasty and follow-up venography showed no extravasation, residual or recurrent stenosis.  IMPRESSION: 1. Extensive occlusive left upper extremity, subclavian, and innominate vein DVT, with successful TPA assisted mechanical thrombectomy. 2. The SVC stent remains patent. Non opacification on prior CT probably secondary to injection through the port catheter. 3. The SVC stent covers the inflow from the left innominate vein. Additionally, tapered narrowing of the central left innominate vein just proximal to its confluence with the SVC suggests involvement by the patient's known recurrent tumor. Although this region responded to 10 mm  balloon angioplasty, she remains at risk for recurrence of thrombosis and should probably REMAIN ON LIFELONG ANTICOAGULATION if tolerated. 4. Mild proximal SVC stent stenosis, with good response to 10 mm balloon angioplasty.   Electronically Signed   By: Keturah Barre  Vernard Gambles M.D.   On: 12/01/2014 12:54   Ir Venocavagram Svc  12/01/2014   INDICATION: Right SVC clot and left arm brachial vein clot. 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 12 mm SVC stent placed 10/17/2014.  EXAM: THROMBOECTOMY MECHANICAL VENOUS; PTA VENOUS; LEFT EXTREMITY VENOGRAPHY; IR ULTRASOUND GUIDANCE VASC ACCESS RIGHT; IR ULTRASOUND GUIDANCE VASC ACCESS LEFT; SUPERIOR VENA CAVOGRAM  COMPARISON:  CT 11/30/2014 and earlier studies  MEDICATIONS: Intravenous Fentanyl and Versed were administered as conscious sedation during continuous cardiorespiratory monitoring by the radiology RN, with a total moderate sedation time of 60 minutes.  CONTRAST:  39mL OMNIPAQUE IOHEXOL 300 MG/ML  SOLN  FLUOROSCOPY TIME:  5 minutes.  42 seconds.  COMPLICATIONS: None immediate  TECHNIQUE: Informed written consent was obtained from the patient after a discussion of the risks, benefits and alternatives to treatment. Questions regarding the procedure were encouraged and answered. A timeout was performed prior to the initiation of the procedure.  Survey ultrasound of the left upper extremity was performed. The brachial DVT was localized and a more peripheral patent segment of the brachial vein was identified for access. Overlying skin site was marked.  The site was prepped and draped in the usual sterile fashion, and a sterile drape was applied covering the operative field. Maximum barrier sterile technique with sterile gowns and gloves were used for the procedure. A timeout was performed prior to the initiation of the procedure. Local anesthesia was provided with 1% lidocaine.  Under real-time ultrasound guidance, access to the  left brachial vein was achieved with a micropuncture set. Left upper extremity venography was performed. The dilator was exchanged over a Bentson for a 6 [place] vascular sheath, through which a 5 [place] Kumpe catheter was advanced to the SVC. This was exchanged for a Angiojet catheter, which allowed pulse spray delivery of 10 mg tPA in100 mL sterile saline through the left upper extremity, subclavian and innominate vein clot. The Angiojet catheter would not advance across the stent into the SVC, so right IJ access was achieved. As before, the region was prepped and draped in usual sterile fashion and ultrasound-guided micropuncture access to the right IJ was achieved. Venography was performed. After the TPA had been allowed least 20 minutes post pulse spray, the Angiojet was again utilized in thrombectomy mode to extract the thrombus from the left upper extremity, subclavian and innominate veins. Residual occlusive thrombus in the central left subclavian and innominate vein was treated with additional passes of the Angiojet thrombectomy catheter. This was exchanged for a 10 mm Mustang balloon, advanced to the confluence of the left innominate vein and SVC for dilatation of a persistent stenosis. In similar fashion, the 10 mm balloon was advanced via the right IJ access for PTA of a proximal SVC stenosis. Final venography at both sites was performed. The catheters, guidewires and sheaths were removed and hemostasis achieved at the site with manual compression. The patient tolerated the procedure well.  FINDINGS: Left upper extremity venography demonstrated occlusive clot in the central aspect of the left brachial vein extending through the axillary vein, left subclavian vein and left innominate vein. There is tapered narrowing of the innominate vein near its confluence with the SVC. There was non opacification of the SVC. The left upper extremity, subclavian, and innominate thrombus was treated with mechanical  thrombectomy after pulse spray of tPA as detailed above. No significant residual clot on final venography.  Right IJ venography demonstrated patency of the  SVC. The stent extends from the distal right innominate vein to the SVC, covering the inflow of the left innominate vein. No collateral filling was identified. There is mild narrowing of the stent at the confluence of the innominate veins.  The central left innominate vein stenosis responded well to 10 mm balloon angioplasty. Follow-up venography showed significant improvement in diameter of the vein at its confluence with the SVC at the level of the sidewall stent. The proximal SVC stenosis responded readily to 10 mm balloon angioplasty and follow-up venography showed no extravasation, residual or recurrent stenosis.  IMPRESSION: 1. Extensive occlusive left upper extremity, subclavian, and innominate vein DVT, with successful TPA assisted mechanical thrombectomy. 2. The SVC stent remains patent. Non opacification on prior CT probably secondary to injection through the port catheter. 3. The SVC stent covers the inflow from the left innominate vein. Additionally, tapered narrowing of the central left innominate vein just proximal to its confluence with the SVC suggests involvement by the patient's known recurrent tumor. Although this region responded to 10 mm balloon angioplasty, she remains at risk for recurrence of thrombosis and should probably REMAIN ON LIFELONG ANTICOAGULATION if tolerated. 4. Mild proximal SVC stent stenosis, with good response to 10 mm balloon angioplasty.   Electronically Signed   By: Lucrezia Europe M.D.   On: 12/01/2014 12:54   [place] Extrem Up Left Ltd  12/04/2014   CLINICAL DATA:  Evaluate cellulitis left antecubital fossa, clot removed 12/01/2014, redness and swelling left arm  EXAM: ULTRASOUND LEFT UPPER EXTREMITY LIMITED  TECHNIQUE: Ultrasound examination of the upper extremity soft tissues was performed in the area of clinical concern.   COMPARISON:  None  FINDINGS: There is severe edematous change in the soft tissues. There is no focal fluid collection.  IMPRESSION: Findings consistent with severe cellulitis.  No abscess identified.   Electronically Signed   By: Skipper Cliche M.D.   On: 12/04/2014 17:07   Ir Pta Venous Left  12/01/2014   INDICATION: Right SVC clot and left arm brachial vein clot. 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 12 mm SVC stent placed 10/17/2014.  EXAM: THROMBOECTOMY MECHANICAL VENOUS; PTA VENOUS; LEFT EXTREMITY VENOGRAPHY; IR ULTRASOUND GUIDANCE VASC ACCESS RIGHT; IR ULTRASOUND GUIDANCE VASC ACCESS LEFT; SUPERIOR VENA CAVOGRAM  COMPARISON:  CT 11/30/2014 and earlier studies  MEDICATIONS: Intravenous Fentanyl and Versed were administered as conscious sedation during continuous cardiorespiratory monitoring by the radiology RN, with a total moderate sedation time of 60 minutes.  CONTRAST:  65mL OMNIPAQUE IOHEXOL 300 MG/ML  SOLN  FLUOROSCOPY TIME:  5 minutes.  42 seconds.  COMPLICATIONS: None immediate  TECHNIQUE: Informed written consent was obtained from the patient after a discussion of the risks, benefits and alternatives to treatment. Questions regarding the procedure were encouraged and answered. A timeout was performed prior to the initiation of the procedure.  Survey ultrasound of the left upper extremity was performed. The brachial DVT was localized and a more peripheral patent segment of the brachial vein was identified for access. Overlying skin site was marked.  The site was prepped and draped in the usual sterile fashion, and a sterile drape was applied covering the operative field. Maximum barrier sterile technique with sterile gowns and gloves were used for the procedure. A timeout was performed prior to the initiation of the procedure. Local anesthesia was provided with 1% lidocaine.  Under real-time ultrasound guidance, access to the left brachial vein was  achieved with a  micropuncture set. Left upper extremity venography was performed. The dilator was exchanged over a Bentson for a 6 [place] vascular sheath, through which a 5 [place] Kumpe catheter was advanced to the SVC. This was exchanged for a Angiojet catheter, which allowed pulse spray delivery of 10 mg tPA in100 mL sterile saline through the left upper extremity, subclavian and innominate vein clot. The Angiojet catheter would not advance across the stent into the SVC, so right IJ access was achieved. As before, the region was prepped and draped in usual sterile fashion and ultrasound-guided micropuncture access to the right IJ was achieved. Venography was performed. After the TPA had been allowed least 20 minutes post pulse spray, the Angiojet was again utilized in thrombectomy mode to extract the thrombus from the left upper extremity, subclavian and innominate veins. Residual occlusive thrombus in the central left subclavian and innominate vein was treated with additional passes of the Angiojet thrombectomy catheter. This was exchanged for a 10 mm Mustang balloon, advanced to the confluence of the left innominate vein and SVC for dilatation of a persistent stenosis. In similar fashion, the 10 mm balloon was advanced via the right IJ access for PTA of a proximal SVC stenosis. Final venography at both sites was performed. The catheters, guidewires and sheaths were removed and hemostasis achieved at the site with manual compression. The patient tolerated the procedure well.  FINDINGS: Left upper extremity venography demonstrated occlusive clot in the central aspect of the left brachial vein extending through the axillary vein, left subclavian vein and left innominate vein. There is tapered narrowing of the innominate vein near its confluence with the SVC. There was non opacification of the SVC. The left upper extremity, subclavian, and innominate thrombus was treated with mechanical thrombectomy after pulse  spray of tPA as detailed above. No significant residual clot on final venography.  Right IJ venography demonstrated patency of the SVC. The stent extends from the distal right innominate vein to the SVC, covering the inflow of the left innominate vein. No collateral filling was identified. There is mild narrowing of the stent at the confluence of the innominate veins.  The central left innominate vein stenosis responded well to 10 mm balloon angioplasty. Follow-up venography showed significant improvement in diameter of the vein at its confluence with the SVC at the level of the sidewall stent. The proximal SVC stenosis responded readily to 10 mm balloon angioplasty and follow-up venography showed no extravasation, residual or recurrent stenosis.  IMPRESSION: 1. Extensive occlusive left upper extremity, subclavian, and innominate vein DVT, with successful TPA assisted mechanical thrombectomy. 2. The SVC stent remains patent. Non opacification on prior CT probably secondary to injection through the port catheter. 3. The SVC stent covers the inflow from the left innominate vein. Additionally, tapered narrowing of the central left innominate vein just proximal to its confluence with the SVC suggests involvement by the patient's known recurrent tumor. Although this region responded to 10 mm balloon angioplasty, she remains at risk for recurrence of thrombosis and should probably REMAIN ON LIFELONG ANTICOAGULATION if tolerated. 4. Mild proximal SVC stent stenosis, with good response to 10 mm balloon angioplasty.   Electronically Signed   By: Lucrezia Europe M.D.   On: 12/01/2014 12:54   Ir Pta Venous Right  12/01/2014   INDICATION: Right SVC clot and left arm brachial vein clot. 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 12 mm SVC stent placed 10/17/2014.  EXAM: THROMBOECTOMY MECHANICAL VENOUS;  PTA VENOUS; LEFT EXTREMITY VENOGRAPHY; IR ULTRASOUND GUIDANCE VASC ACCESS RIGHT;  IR ULTRASOUND GUIDANCE VASC ACCESS LEFT; SUPERIOR VENA CAVOGRAM  COMPARISON:  CT 11/30/2014 and earlier studies  MEDICATIONS: Intravenous Fentanyl and Versed were administered as conscious sedation during continuous cardiorespiratory monitoring by the radiology RN, with a total moderate sedation time of 60 minutes.  CONTRAST:  33mL OMNIPAQUE IOHEXOL 300 MG/ML  SOLN  FLUOROSCOPY TIME:  5 minutes.  42 seconds.  COMPLICATIONS: None immediate  TECHNIQUE: Informed written consent was obtained from the patient after a discussion of the risks, benefits and alternatives to treatment. Questions regarding the procedure were encouraged and answered. A timeout was performed prior to the initiation of the procedure.  Survey ultrasound of the left upper extremity was performed. The brachial DVT was localized and a more peripheral patent segment of the brachial vein was identified for access. Overlying skin site was marked.  The site was prepped and draped in the usual sterile fashion, and a sterile drape was applied covering the operative field. Maximum barrier sterile technique with sterile gowns and gloves were used for the procedure. A timeout was performed prior to the initiation of the procedure. Local anesthesia was provided with 1% lidocaine.  Under real-time ultrasound guidance, access to the left brachial vein was achieved with a micropuncture set. Left upper extremity venography was performed. The dilator was exchanged over a Bentson for a 6 [place] vascular sheath, through which a 5 [place] Kumpe catheter was advanced to the SVC. This was exchanged for a Angiojet catheter, which allowed pulse spray delivery of 10 mg tPA in100 mL sterile saline through the left upper extremity, subclavian and innominate vein clot. The Angiojet catheter would not advance across the stent into the SVC, so right IJ access was achieved. As before, the region was prepped and draped in usual sterile fashion and ultrasound-guided micropuncture  access to the right IJ was achieved. Venography was performed. After the TPA had been allowed least 20 minutes post pulse spray, the Angiojet was again utilized in thrombectomy mode to extract the thrombus from the left upper extremity, subclavian and innominate veins. Residual occlusive thrombus in the central left subclavian and innominate vein was treated with additional passes of the Angiojet thrombectomy catheter. This was exchanged for a 10 mm Mustang balloon, advanced to the confluence of the left innominate vein and SVC for dilatation of a persistent stenosis. In similar fashion, the 10 mm balloon was advanced via the right IJ access for PTA of a proximal SVC stenosis. Final venography at both sites was performed. The catheters, guidewires and sheaths were removed and hemostasis achieved at the site with manual compression. The patient tolerated the procedure well.  FINDINGS: Left upper extremity venography demonstrated occlusive clot in the central aspect of the left brachial vein extending through the axillary vein, left subclavian vein and left innominate vein. There is tapered narrowing of the innominate vein near its confluence with the SVC. There was non opacification of the SVC. The left upper extremity, subclavian, and innominate thrombus was treated with mechanical thrombectomy after pulse spray of tPA as detailed above. No significant residual clot on final venography.  Right IJ venography demonstrated patency of the SVC. The stent extends from the distal right innominate vein to the SVC, covering the inflow of the left innominate vein. No collateral filling was identified. There is mild narrowing of the stent at the confluence of the innominate veins.  The central left innominate vein stenosis responded well to 10 mm balloon  angioplasty. Follow-up venography showed significant improvement in diameter of the vein at its confluence with the SVC at the level of the sidewall stent. The proximal SVC  stenosis responded readily to 10 mm balloon angioplasty and follow-up venography showed no extravasation, residual or recurrent stenosis.  IMPRESSION: 1. Extensive occlusive left upper extremity, subclavian, and innominate vein DVT, with successful TPA assisted mechanical thrombectomy. 2. The SVC stent remains patent. Non opacification on prior CT probably secondary to injection through the port catheter. 3. The SVC stent covers the inflow from the left innominate vein. Additionally, tapered narrowing of the central left innominate vein just proximal to its confluence with the SVC suggests involvement by the patient's known recurrent tumor. Although this region responded to 10 mm balloon angioplasty, she remains at risk for recurrence of thrombosis and should probably REMAIN ON LIFELONG ANTICOAGULATION if tolerated. 4. Mild proximal SVC stent stenosis, with good response to 10 mm balloon angioplasty.   Electronically Signed   By: Lucrezia Europe M.D.   On: 12/01/2014 12:54   Ir Thrombect Veno Mech Mod Sed  12/01/2014   INDICATION: Right SVC clot and left arm brachial vein clot. 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 12 mm SVC stent placed 10/17/2014.  EXAM: THROMBOECTOMY MECHANICAL VENOUS; PTA VENOUS; LEFT EXTREMITY VENOGRAPHY; IR ULTRASOUND GUIDANCE VASC ACCESS RIGHT; IR ULTRASOUND GUIDANCE VASC ACCESS LEFT; SUPERIOR VENA CAVOGRAM  COMPARISON:  CT 11/30/2014 and earlier studies  MEDICATIONS: Intravenous Fentanyl and Versed were administered as conscious sedation during continuous cardiorespiratory monitoring by the radiology RN, with a total moderate sedation time of 60 minutes.  CONTRAST:  59mL OMNIPAQUE IOHEXOL 300 MG/ML  SOLN  FLUOROSCOPY TIME:  5 minutes.  42 seconds.  COMPLICATIONS: None immediate  TECHNIQUE: Informed written consent was obtained from the patient after a discussion of the risks, benefits and alternatives to treatment. Questions regarding the  procedure were encouraged and answered. A timeout was performed prior to the initiation of the procedure.  Survey ultrasound of the left upper extremity was performed. The brachial DVT was localized and a more peripheral patent segment of the brachial vein was identified for access. Overlying skin site was marked.  The site was prepped and draped in the usual sterile fashion, and a sterile drape was applied covering the operative field. Maximum barrier sterile technique with sterile gowns and gloves were used for the procedure. A timeout was performed prior to the initiation of the procedure. Local anesthesia was provided with 1% lidocaine.  Under real-time ultrasound guidance, access to the left brachial vein was achieved with a micropuncture set. Left upper extremity venography was performed. The dilator was exchanged over a Bentson for a 6 [place] vascular sheath, through which a 5 [place] Kumpe catheter was advanced to the SVC. This was exchanged for a Angiojet catheter, which allowed pulse spray delivery of 10 mg tPA in100 mL sterile saline through the left upper extremity, subclavian and innominate vein clot. The Angiojet catheter would not advance across the stent into the SVC, so right IJ access was achieved. As before, the region was prepped and draped in usual sterile fashion and ultrasound-guided micropuncture access to the right IJ was achieved. Venography was performed. After the TPA had been allowed least 20 minutes post pulse spray, the Angiojet was again utilized in thrombectomy mode to extract the thrombus from the left upper extremity, subclavian and innominate veins. Residual occlusive thrombus in the central left subclavian and innominate vein was  treated with additional passes of the Angiojet thrombectomy catheter. This was exchanged for a 10 mm Mustang balloon, advanced to the confluence of the left innominate vein and SVC for dilatation of a persistent stenosis. In similar fashion, the 10 mm  balloon was advanced via the right IJ access for PTA of a proximal SVC stenosis. Final venography at both sites was performed. The catheters, guidewires and sheaths were removed and hemostasis achieved at the site with manual compression. The patient tolerated the procedure well.  FINDINGS: Left upper extremity venography demonstrated occlusive clot in the central aspect of the left brachial vein extending through the axillary vein, left subclavian vein and left innominate vein. There is tapered narrowing of the innominate vein near its confluence with the SVC. There was non opacification of the SVC. The left upper extremity, subclavian, and innominate thrombus was treated with mechanical thrombectomy after pulse spray of tPA as detailed above. No significant residual clot on final venography.  Right IJ venography demonstrated patency of the SVC. The stent extends from the distal right innominate vein to the SVC, covering the inflow of the left innominate vein. No collateral filling was identified. There is mild narrowing of the stent at the confluence of the innominate veins.  The central left innominate vein stenosis responded well to 10 mm balloon angioplasty. Follow-up venography showed significant improvement in diameter of the vein at its confluence with the SVC at the level of the sidewall stent. The proximal SVC stenosis responded readily to 10 mm balloon angioplasty and follow-up venography showed no extravasation, residual or recurrent stenosis.  IMPRESSION: 1. Extensive occlusive left upper extremity, subclavian, and innominate vein DVT, with successful TPA assisted mechanical thrombectomy. 2. The SVC stent remains patent. Non opacification on prior CT probably secondary to injection through the port catheter. 3. The SVC stent covers the inflow from the left innominate vein. Additionally, tapered narrowing of the central left innominate vein just proximal to its confluence with the SVC suggests  involvement by the patient's known recurrent tumor. Although this region responded to 10 mm balloon angioplasty, she remains at risk for recurrence of thrombosis and should probably REMAIN ON LIFELONG ANTICOAGULATION if tolerated. 4. Mild proximal SVC stent stenosis, with good response to 10 mm balloon angioplasty.   Electronically Signed   By: Lucrezia Europe M.D.   On: 12/01/2014 12:54   Ir US Guide Vasc Access Left  12/01/2014   INDICATION: Right SVC clot and left arm brachial vein clot. 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 12 mm SVC stent placed 10/17/2014.  EXAM: THROMBOECTOMY MECHANICAL VENOUS; PTA VENOUS; LEFT EXTREMITY VENOGRAPHY; IR ULTRASOUND GUIDANCE VASC ACCESS RIGHT; IR ULTRASOUND GUIDANCE VASC ACCESS LEFT; SUPERIOR VENA CAVOGRAM  COMPARISON:  CT 11/30/2014 and earlier studies  MEDICATIONS: Intravenous Fentanyl and Versed were administered as conscious sedation during continuous cardiorespiratory monitoring by the radiology RN, with a total moderate sedation time of 60 minutes.  CONTRAST:  72mL OMNIPAQUE IOHEXOL 300 MG/ML  SOLN  FLUOROSCOPY TIME:  5 minutes.  42 seconds.  COMPLICATIONS: None immediate  TECHNIQUE: Informed written consent was obtained from the patient after a discussion of the risks, benefits and alternatives to treatment. Questions regarding the procedure were encouraged and answered. A timeout was performed prior to the initiation of the procedure.  Survey ultrasound of the left upper extremity was performed. The brachial DVT was localized and a more peripheral patent segment of the brachial vein was identified for access.  Overlying skin site was marked.  The site was prepped and draped in the usual sterile fashion, and a sterile drape was applied covering the operative field. Maximum barrier sterile technique with sterile gowns and gloves were used for the procedure. A timeout was performed prior to the initiation of the procedure.  Local anesthesia was provided with 1% lidocaine.  Under real-time ultrasound guidance, access to the left brachial vein was achieved with a micropuncture set. Left upper extremity venography was performed. The dilator was exchanged over a Bentson for a 6 [place] vascular sheath, through which a 5 [place] Kumpe catheter was advanced to the SVC. This was exchanged for a Angiojet catheter, which allowed pulse spray delivery of 10 mg tPA in100 mL sterile saline through the left upper extremity, subclavian and innominate vein clot. The Angiojet catheter would not advance across the stent into the SVC, so right IJ access was achieved. As before, the region was prepped and draped in usual sterile fashion and ultrasound-guided micropuncture access to the right IJ was achieved. Venography was performed. After the TPA had been allowed least 20 minutes post pulse spray, the Angiojet was again utilized in thrombectomy mode to extract the thrombus from the left upper extremity, subclavian and innominate veins. Residual occlusive thrombus in the central left subclavian and innominate vein was treated with additional passes of the Angiojet thrombectomy catheter. This was exchanged for a 10 mm Mustang balloon, advanced to the confluence of the left innominate vein and SVC for dilatation of a persistent stenosis. In similar fashion, the 10 mm balloon was advanced via the right IJ access for PTA of a proximal SVC stenosis. Final venography at both sites was performed. The catheters, guidewires and sheaths were removed and hemostasis achieved at the site with manual compression. The patient tolerated the procedure well.  FINDINGS: Left upper extremity venography demonstrated occlusive clot in the central aspect of the left brachial vein extending through the axillary vein, left subclavian vein and left innominate vein. There is tapered narrowing of the innominate vein near its confluence with the SVC. There was non opacification of the  SVC. The left upper extremity, subclavian, and innominate thrombus was treated with mechanical thrombectomy after pulse spray of tPA as detailed above. No significant residual clot on final venography.  Right IJ venography demonstrated patency of the SVC. The stent extends from the distal right innominate vein to the SVC, covering the inflow of the left innominate vein. No collateral filling was identified. There is mild narrowing of the stent at the confluence of the innominate veins.  The central left innominate vein stenosis responded well to 10 mm balloon angioplasty. Follow-up venography showed significant improvement in diameter of the vein at its confluence with the SVC at the level of the sidewall stent. The proximal SVC stenosis responded readily to 10 mm balloon angioplasty and follow-up venography showed no extravasation, residual or recurrent stenosis.  IMPRESSION: 1. Extensive occlusive left upper extremity, subclavian, and innominate vein DVT, with successful TPA assisted mechanical thrombectomy. 2. The SVC stent remains patent. Non opacification on prior CT probably secondary to injection through the port catheter. 3. The SVC stent covers the inflow from the left innominate vein. Additionally, tapered narrowing of the central left innominate vein just proximal to its confluence with the SVC suggests involvement by the patient's known recurrent tumor. Although this region responded to 10 mm balloon angioplasty, she remains at risk for recurrence of thrombosis and should probably REMAIN ON LIFELONG ANTICOAGULATION if tolerated. 4. Mild  proximal SVC stent stenosis, with good response to 10 mm balloon angioplasty.   Electronically Signed   By: Lucrezia Europe M.D.   On: 12/01/2014 12:54   Ir US Guide Vasc Access Right  12/01/2014   INDICATION: Right SVC clot and left arm brachial vein clot. 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 12 mm SVC stent placed  10/17/2014.  EXAM: THROMBOECTOMY MECHANICAL VENOUS; PTA VENOUS; LEFT EXTREMITY VENOGRAPHY; IR ULTRASOUND GUIDANCE VASC ACCESS RIGHT; IR ULTRASOUND GUIDANCE VASC ACCESS LEFT; SUPERIOR VENA CAVOGRAM  COMPARISON:  CT 11/30/2014 and earlier studies  MEDICATIONS: Intravenous Fentanyl and Versed were administered as conscious sedation during continuous cardiorespiratory monitoring by the radiology RN, with a total moderate sedation time of 60 minutes.  CONTRAST:  11mL OMNIPAQUE IOHEXOL 300 MG/ML  SOLN  FLUOROSCOPY TIME:  5 minutes.  42 seconds.  COMPLICATIONS: None immediate  TECHNIQUE: Informed written consent was obtained from the patient after a discussion of the risks, benefits and alternatives to treatment. Questions regarding the procedure were encouraged and answered. A timeout was performed prior to the initiation of the procedure.  Survey ultrasound of the left upper extremity was performed. The brachial DVT was localized and a more peripheral patent segment of the brachial vein was identified for access. Overlying skin site was marked.  The site was prepped and draped in the usual sterile fashion, and a sterile drape was applied covering the operative field. Maximum barrier sterile technique with sterile gowns and gloves were used for the procedure. A timeout was performed prior to the initiation of the procedure. Local anesthesia was provided with 1% lidocaine.  Under real-time ultrasound guidance, access to the left brachial vein was achieved with a micropuncture set. Left upper extremity venography was performed. The dilator was exchanged over a Bentson for a 6 [place] vascular sheath, through which a 5 [place] Kumpe catheter was advanced to the SVC. This was exchanged for a Angiojet catheter, which allowed pulse spray delivery of 10 mg tPA in100 mL sterile saline through the left upper extremity, subclavian and innominate vein clot. The Angiojet catheter would not advance across the stent into the SVC, so  right IJ access was achieved. As before, the region was prepped and draped in usual sterile fashion and ultrasound-guided micropuncture access to the right IJ was achieved. Venography was performed. After the TPA had been allowed least 20 minutes post pulse spray, the Angiojet was again utilized in thrombectomy mode to extract the thrombus from the left upper extremity, subclavian and innominate veins. Residual occlusive thrombus in the central left subclavian and innominate vein was treated with additional passes of the Angiojet thrombectomy catheter. This was exchanged for a 10 mm Mustang balloon, advanced to the confluence of the left innominate vein and SVC for dilatation of a persistent stenosis. In similar fashion, the 10 mm balloon was advanced via the right IJ access for PTA of a proximal SVC stenosis. Final venography at both sites was performed. The catheters, guidewires and sheaths were removed and hemostasis achieved at the site with manual compression. The patient tolerated the procedure well.  FINDINGS: Left upper extremity venography demonstrated occlusive clot in the central aspect of the left brachial vein extending through the axillary vein, left subclavian vein and left innominate vein. There is tapered narrowing of the innominate vein near its confluence with the SVC. There was non opacification of the SVC. The left upper extremity, subclavian, and innominate thrombus was treated with mechanical  thrombectomy after pulse spray of tPA as detailed above. No significant residual clot on final venography.  Right IJ venography demonstrated patency of the SVC. The stent extends from the distal right innominate vein to the SVC, covering the inflow of the left innominate vein. No collateral filling was identified. There is mild narrowing of the stent at the confluence of the innominate veins.  The central left innominate vein stenosis responded well to 10 mm balloon angioplasty. Follow-up venography  showed significant improvement in diameter of the vein at its confluence with the SVC at the level of the sidewall stent. The proximal SVC stenosis responded readily to 10 mm balloon angioplasty and follow-up venography showed no extravasation, residual or recurrent stenosis.  IMPRESSION: 1. Extensive occlusive left upper extremity, subclavian, and innominate vein DVT, with successful TPA assisted mechanical thrombectomy. 2. The SVC stent remains patent. Non opacification on prior CT probably secondary to injection through the port catheter. 3. The SVC stent covers the inflow from the left innominate vein. Additionally, tapered narrowing of the central left innominate vein just proximal to its confluence with the SVC suggests involvement by the patient's known recurrent tumor. Although this region responded to 10 mm balloon angioplasty, she remains at risk for recurrence of thrombosis and should probably REMAIN ON LIFELONG ANTICOAGULATION if tolerated. 4. Mild proximal SVC stent stenosis, with good response to 10 mm balloon angioplasty.   Electronically Signed   By: Lucrezia Europe M.D.   On: 12/01/2014 12:54   Dg Chest Port 1 View  11/30/2014   CLINICAL DATA:  Lung cancer with bilateral upper extremity edema  EXAM: PORTABLE CHEST - 1 VIEW  COMPARISON:  Chest radiograph October 09, 2014 and chest CT November 03, 2014  FINDINGS: The previously noted lung carcinoma in the right upper lobe is again present measuring approximately 4.2 x 3.4 cm. Is tumor appears to extend into the right hilum, unchanged. Lungs elsewhere clear. Heart size and pulmonary vascularity are normal. No adenopathy. Port-A-Cath tip is in the superior vena cava. No pneumothorax. There is a stent in the superior vena cava region, stable.  IMPRESSION: Persistent right upper lobe mass extending in the right hilum. Stent in superior vena cava. No new opacity. No change in cardiac silhouette.   Electronically Signed   By: Lowella Grip III M.D.   On:  11/30/2014 15:49   ASSESSMENT AND PLAN: This is a very pleasant 59 years old white female with recurrent non-small cell lung cancer status post concurrent chemoradiation as well as consolidation chemotherapy and she is currently on systemic chemotherapy with single agent gemcitabine status post 4 cycles.  She was treated again with a course of systemic chemotherapy with single agent gemcitabine for 3 cycles but the patient has rough time tolerating her treatment with significant fatigue and weakness. The recent CT scan of the chest, abdomen and pelvis showed stable disease but the patient continues to have the persistent right upper lobe area of mass fibrosis and consolidation as well as persistent right hilar, subcarinal and right paratracheal lymphadenopathy as well as, one area of masslike soft tissue immediately anterior to the carina. She was started on treatment with Nivolumab status post 2 cycles. She tolerated the treatment well but she presented with evidence for disease progression in the chest. This treatment was discontinued. 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 Also the most recent CT angiogram of the chest showed further disease progression. She is currently being treated with systemic chemotherapy  with carboplatin for AUC of 5 on day 1 and gemcitabine 1000 MG/M2 on days 1 and 8 every 3 weeks. Status post 1 cycle. Starting with cycle #2 she was treated with gemcitabine only due to a positive carboplatin skin test. Patient was discussed with and also seen by Dr. [name]. She will continue on Lovenox as previously prescribed for her left upper extremity DVT. She had a CT NGO of the chest recently we will postpone her restaging CT scan until after cycle #4 or perhaps cycle #5. She'll proceed with cycle #3 consisting of gemcitabine only today as scheduled. She will follow-up in 3 weeks prior to the start of cycle #4.   She was advised to call  immediately if she has any concerning symptoms in the interval.  All questions were answered. The patient knows to call the clinic with any problems, questions or concerns. We can certainly see the patient much sooner if necessary.  Carlton Adam PA-C  ADDENDUM: Hematology/Oncology Attending: I had a face to face encounter with the patient. I recommended her care plan. This is a very pleasant 59 years old white female with recurrent non-small cell lung cancer, squamous cell carcinoma who is currently undergoing systemic chemotherapy with single agent gemcitabine after the patient was found to have reaction to the Carboplatin skin dose. She is tolerating her systemic chemotherapy fairly well with no significant adverse effects except for mild fatigue. I recommended for the patient to continue her current treatment as scheduled. She will come back for follow-up visit in 3 weeks with the start of cycle #4. She was advised to call immediately if she has any concerning symptoms in the interval.  Disclaimer: This note was dictated with voice recognition software. Similar sounding words can inadvertently be transcribed and may not be corrected upon review. Eilleen Kempf., MD 12/23/2014

## 2014-12-22 NOTE — Patient Instructions (Signed)
Continue on Lovenox as prescribed Continue labs and chemotherapy as scheduled Follow up in 3 weeks, prior to your next scheduled cycle of chemotherapy

## 2014-12-26 ENCOUNTER — Other Ambulatory Visit (HOSPITAL_BASED_OUTPATIENT_CLINIC_OR_DEPARTMENT_OTHER): Payer: Medicare Other

## 2014-12-26 ENCOUNTER — Ambulatory Visit (HOSPITAL_BASED_OUTPATIENT_CLINIC_OR_DEPARTMENT_OTHER): Payer: Medicare Other

## 2014-12-26 DIAGNOSIS — Z5111 Encounter for antineoplastic chemotherapy: Secondary | ICD-10-CM

## 2014-12-26 DIAGNOSIS — C3491 Malignant neoplasm of unspecified part of right bronchus or lung: Secondary | ICD-10-CM

## 2014-12-26 DIAGNOSIS — C3411 Malignant neoplasm of upper lobe, right bronchus or lung: Secondary | ICD-10-CM

## 2014-12-26 LAB — CBC WITH DIFFERENTIAL/PLATELET
BASO%: 1.4 % (ref 0.0–2.0)
Basophils Absolute: 0 10*3/uL (ref 0.0–0.1)
EOS%: 3.1 % (ref 0.0–7.0)
Eosinophils Absolute: 0.1 10*3/uL (ref 0.0–0.5)
HEMATOCRIT: 34.5 % — AB (ref 34.8–46.6)
HEMOGLOBIN: 11.2 g/dL — AB (ref 11.6–15.9)
LYMPH%: 22.1 % (ref 14.0–49.7)
MCH: 28.1 pg (ref 25.1–34.0)
MCHC: 32.5 g/dL (ref 31.5–36.0)
MCV: 86.5 fL (ref 79.5–101.0)
MONO#: 0.4 10*3/uL (ref 0.1–0.9)
MONO%: 12.6 % (ref 0.0–14.0)
NEUT#: 1.8 10*3/uL (ref 1.5–6.5)
NEUT%: 60.8 % (ref 38.4–76.8)
PLATELETS: 136 10*3/uL — AB (ref 145–400)
RBC: 3.99 10*6/uL (ref 3.70–5.45)
RDW: 19.7 % — ABNORMAL HIGH (ref 11.2–14.5)
WBC: 2.9 10*3/uL — AB (ref 3.9–10.3)
lymph#: 0.7 10*3/uL — ABNORMAL LOW (ref 0.9–3.3)

## 2014-12-26 LAB — COMPREHENSIVE METABOLIC PANEL (CC13)
ALT: 24 U/L (ref 0–55)
AST: 21 U/L (ref 5–34)
Albumin: 3.4 g/dL — ABNORMAL LOW (ref 3.5–5.0)
Alkaline Phosphatase: 70 U/L (ref 40–150)
Anion Gap: 9 mEq/L (ref 3–11)
BUN: 6.5 mg/dL — ABNORMAL LOW (ref 7.0–26.0)
CALCIUM: 8.5 mg/dL (ref 8.4–10.4)
CHLORIDE: 103 meq/L (ref 98–109)
CO2: 23 mEq/L (ref 22–29)
CREATININE: 0.8 mg/dL (ref 0.6–1.1)
EGFR: 81 mL/min/{1.73_m2} — AB (ref >90)
Glucose: 289 mg/dl — ABNORMAL HIGH (ref 70–140)
POTASSIUM: 4.2 meq/L (ref 3.5–5.1)
Sodium: 136 mEq/L (ref 136–145)
Total Bilirubin: 0.23 mg/dL (ref 0.20–1.20)
Total Protein: 7 g/dL (ref 6.4–8.3)

## 2014-12-26 MED ORDER — SODIUM CHLORIDE 0.9 % IJ SOLN
10.0000 mL | INTRAMUSCULAR | Status: DC | PRN
  Administered 2014-12-26: 10 mL
  Filled 2014-12-26: qty 10

## 2014-12-26 MED ORDER — SODIUM CHLORIDE 0.9 % IV SOLN
Freq: Once | INTRAVENOUS | Status: AC
  Administered 2014-12-26: 14:00:00 via INTRAVENOUS

## 2014-12-26 MED ORDER — HEPARIN SOD (PORK) LOCK FLUSH 100 UNIT/ML IV SOLN
500.0000 [IU] | Freq: Once | INTRAVENOUS | Status: AC | PRN
  Administered 2014-12-26: 500 [IU]
  Filled 2014-12-26: qty 5

## 2014-12-26 MED ORDER — SODIUM CHLORIDE 0.9 % IV SOLN
1000.0000 mg/m2 | Freq: Once | INTRAVENOUS | Status: AC
  Administered 2014-12-26: 1938 mg via INTRAVENOUS
  Filled 2014-12-26: qty 50.97

## 2014-12-26 MED ORDER — PROCHLORPERAZINE MALEATE 10 MG PO TABS
ORAL_TABLET | ORAL | Status: AC
  Filled 2014-12-26: qty 1

## 2014-12-26 MED ORDER — PROCHLORPERAZINE MALEATE 10 MG PO TABS
10.0000 mg | ORAL_TABLET | Freq: Once | ORAL | Status: AC
  Administered 2014-12-26: 10 mg via ORAL

## 2014-12-26 NOTE — Progress Notes (Signed)
Ok to treat patient despite 110 heart rate per Dr. [name].

## 2014-12-26 NOTE — Patient Instructions (Addendum)
[facility] Discharge Instructions for Patients Receiving Chemotherapy  Today you received the following chemotherapy agents Gemzar  To help prevent nausea and vomiting after your treatment, we encourage you to take your nausea medication as prescribed. If you develop nausea and vomiting that is not controlled by your nausea medication, call the clinic.   BELOW ARE SYMPTOMS THAT SHOULD BE REPORTED IMMEDIATELY:  *FEVER GREATER THAN 100.5 F  *CHILLS WITH OR WITHOUT FEVER  NAUSEA AND VOMITING THAT IS NOT CONTROLLED WITH YOUR NAUSEA MEDICATION  *UNUSUAL SHORTNESS OF BREATH  *UNUSUAL BRUISING OR BLEEDING  TENDERNESS IN MOUTH AND THROAT WITH OR WITHOUT PRESENCE OF ULCERS  *URINARY PROBLEMS  *BOWEL PROBLEMS  UNUSUAL RASH Items with * indicate a potential emergency and should be followed up as soon as possible.  Feel free to call the clinic you have any questions or concerns. The clinic phone number is [phone].

## 2015-01-02 ENCOUNTER — Ambulatory Visit (HOSPITAL_BASED_OUTPATIENT_CLINIC_OR_DEPARTMENT_OTHER): Payer: Medicare Other

## 2015-01-02 DIAGNOSIS — C3411 Malignant neoplasm of upper lobe, right bronchus or lung: Secondary | ICD-10-CM

## 2015-01-02 DIAGNOSIS — C3481 Malignant neoplasm of overlapping sites of right bronchus and lung: Secondary | ICD-10-CM

## 2015-01-02 LAB — CBC WITH DIFFERENTIAL/PLATELET
BASO%: 1.4 % (ref 0.0–2.0)
Basophils Absolute: 0 10*3/uL (ref 0.0–0.1)
EOS%: 1.7 % (ref 0.0–7.0)
Eosinophils Absolute: 0.1 10*3/uL (ref 0.0–0.5)
HEMATOCRIT: 31.3 % — AB (ref 34.8–46.6)
HGB: 10.1 g/dL — ABNORMAL LOW (ref 11.6–15.9)
LYMPH%: 25.9 % (ref 14.0–49.7)
MCH: 27.6 pg (ref 25.1–34.0)
MCHC: 32.3 g/dL (ref 31.5–36.0)
MCV: 85.6 fL (ref 79.5–101.0)
MONO#: 0.3 10*3/uL (ref 0.1–0.9)
MONO%: 11.1 % (ref 0.0–14.0)
NEUT#: 1.8 10*3/uL (ref 1.5–6.5)
NEUT%: 59.9 % (ref 38.4–76.8)
PLATELETS: 80 10*3/uL — AB (ref 145–400)
RBC: 3.66 10*6/uL — ABNORMAL LOW (ref 3.70–5.45)
RDW: 21.6 % — ABNORMAL HIGH (ref 11.2–14.5)
WBC: 2.9 10*3/uL — AB (ref 3.9–10.3)
lymph#: 0.8 10*3/uL — ABNORMAL LOW (ref 0.9–3.3)

## 2015-01-02 LAB — COMPREHENSIVE METABOLIC PANEL (CC13)
ALBUMIN: 3.4 g/dL — AB (ref 3.5–5.0)
ALT: 46 U/L (ref 0–55)
AST: 33 U/L (ref 5–34)
Alkaline Phosphatase: 65 U/L (ref 40–150)
Anion Gap: 11 mEq/L (ref 3–11)
BUN: 7 mg/dL (ref 7.0–26.0)
CALCIUM: 8.3 mg/dL — AB (ref 8.4–10.4)
CO2: 25 meq/L (ref 22–29)
CREATININE: 0.7 mg/dL (ref 0.6–1.1)
Chloride: 100 mEq/L (ref 98–109)
EGFR: 89 mL/min/{1.73_m2} — ABNORMAL LOW (ref >90)
Glucose: 212 mg/dl — ABNORMAL HIGH (ref 70–140)
Potassium: 4.3 mEq/L (ref 3.5–5.1)
SODIUM: 136 meq/L (ref 136–145)
TOTAL PROTEIN: 6.8 g/dL (ref 6.4–8.3)
Total Bilirubin: 0.24 mg/dL (ref 0.20–1.20)

## 2015-01-02 LAB — TECHNOLOGIST REVIEW

## 2015-01-05 ENCOUNTER — Other Ambulatory Visit: Payer: Self-pay | Admitting: Internal Medicine

## 2015-01-07 ENCOUNTER — Ambulatory Visit
Admission: RE | Admit: 2015-01-07 | Discharge: 2015-01-07 | Disposition: A | Discharge status: Home / Self Care | Payer: Medicare Other | Source: Ambulatory Visit | Attending: Radiology | Admitting: Radiology

## 2015-01-07 DIAGNOSIS — I871 Compression of vein: Secondary | ICD-10-CM

## 2015-01-08 ENCOUNTER — Other Ambulatory Visit: Payer: Self-pay | Admitting: Internal Medicine

## 2015-01-08 ENCOUNTER — Other Ambulatory Visit: Payer: Self-pay | Admitting: Physician Assistant

## 2015-01-09 ENCOUNTER — Ambulatory Visit (HOSPITAL_BASED_OUTPATIENT_CLINIC_OR_DEPARTMENT_OTHER): Payer: Medicare Other

## 2015-01-09 ENCOUNTER — Other Ambulatory Visit: Payer: Medicare Other

## 2015-01-09 ENCOUNTER — Ambulatory Visit (HOSPITAL_BASED_OUTPATIENT_CLINIC_OR_DEPARTMENT_OTHER): Payer: Medicare Other | Admitting: Physician Assistant

## 2015-01-09 ENCOUNTER — Encounter: Payer: Self-pay | Admitting: Physician Assistant

## 2015-01-09 ENCOUNTER — Telehealth: Payer: Self-pay | Admitting: Internal Medicine

## 2015-01-09 VITALS — BP 114/56 | HR 110 | Temp 98.3°F | Resp 18 | Ht 67.0 in | Wt 171.1 lb

## 2015-01-09 DIAGNOSIS — C3411 Malignant neoplasm of upper lobe, right bronchus or lung: Secondary | ICD-10-CM | POA: Diagnosis not present

## 2015-01-09 DIAGNOSIS — Z5111 Encounter for antineoplastic chemotherapy: Secondary | ICD-10-CM

## 2015-01-09 DIAGNOSIS — C3481 Malignant neoplasm of overlapping sites of right bronchus and lung: Secondary | ICD-10-CM

## 2015-01-09 LAB — COMPREHENSIVE METABOLIC PANEL (CC13)
ALT: 20 U/L (ref 0–55)
ANION GAP: 13 meq/L — AB (ref 3–11)
AST: 25 U/L (ref 5–34)
Albumin: 3.6 g/dL (ref 3.5–5.0)
Alkaline Phosphatase: 60 U/L (ref 40–150)
BUN: 9 mg/dL (ref 7.0–26.0)
CALCIUM: 8.4 mg/dL (ref 8.4–10.4)
CHLORIDE: 101 meq/L (ref 98–109)
CO2: 25 mEq/L (ref 22–29)
Creatinine: 0.8 mg/dL (ref 0.6–1.1)
EGFR: 80 mL/min/{1.73_m2} — ABNORMAL LOW (ref >90)
GLUCOSE: 255 mg/dL — AB (ref 70–140)
Potassium: 4.4 mEq/L (ref 3.5–5.1)
Sodium: 139 mEq/L (ref 136–145)
TOTAL PROTEIN: 6.9 g/dL (ref 6.4–8.3)
Total Bilirubin: 0.37 mg/dL (ref 0.20–1.20)

## 2015-01-09 LAB — CBC WITH DIFFERENTIAL/PLATELET
BASO%: 1.2 % (ref 0.0–2.0)
Basophils Absolute: 0.1 10*3/uL (ref 0.0–0.1)
EOS%: 3.4 % (ref 0.0–7.0)
Eosinophils Absolute: 0.2 10*3/uL (ref 0.0–0.5)
HCT: 33.9 % — ABNORMAL LOW (ref 34.8–46.6)
HGB: 10.7 g/dL — ABNORMAL LOW (ref 11.6–15.9)
LYMPH#: 0.8 10*3/uL — AB (ref 0.9–3.3)
LYMPH%: 14.9 % (ref 14.0–49.7)
MCH: 27.4 pg (ref 25.1–34.0)
MCHC: 31.6 g/dL (ref 31.5–36.0)
MCV: 86.7 fL (ref 79.5–101.0)
MONO#: 0.5 10*3/uL (ref 0.1–0.9)
MONO%: 8.5 % (ref 0.0–14.0)
NEUT%: 72 % (ref 38.4–76.8)
NEUTROS ABS: 3.9 10*3/uL (ref 1.5–6.5)
Platelets: 262 10*3/uL (ref 145–400)
RBC: 3.91 10*6/uL (ref 3.70–5.45)
RDW: 23.1 % — AB (ref 11.2–14.5)
WBC: 5.5 10*3/uL (ref 3.9–10.3)

## 2015-01-09 MED ORDER — SODIUM CHLORIDE 0.9 % IJ SOLN
10.0000 mL | INTRAMUSCULAR | Status: DC | PRN
  Administered 2015-01-09: 10 mL
  Filled 2015-01-09: qty 10

## 2015-01-09 MED ORDER — OXYCODONE HCL 5 MG PO TABS: 5.0000 mg | ORAL_TABLET | ORAL | Status: DC | PRN

## 2015-01-09 MED ORDER — SODIUM CHLORIDE 0.9 % IV SOLN
1000.0000 mg/m2 | Freq: Once | INTRAVENOUS | Status: AC
  Administered 2015-01-09: 1938 mg via INTRAVENOUS
  Filled 2015-01-09: qty 50.97

## 2015-01-09 MED ORDER — DEXAMETHASONE SODIUM PHOSPHATE 100 MG/10ML IJ SOLN
Freq: Once | INTRAMUSCULAR | Status: AC
  Administered 2015-01-09: 15:00:00 via INTRAVENOUS
  Filled 2015-01-09: qty 8

## 2015-01-09 MED ORDER — PROCHLORPERAZINE MALEATE 10 MG PO TABS: 10.0000 mg | ORAL_TABLET | Freq: Four times a day (QID) | ORAL | Status: DC | PRN

## 2015-01-09 MED ORDER — HEPARIN SOD (PORK) LOCK FLUSH 100 UNIT/ML IV SOLN
500.0000 [IU] | Freq: Once | INTRAVENOUS | Status: AC | PRN
  Administered 2015-01-09: 500 [IU]
  Filled 2015-01-09: qty 5

## 2015-01-09 MED ORDER — SODIUM CHLORIDE 0.9 % IV SOLN
Freq: Once | INTRAVENOUS | Status: AC
  Administered 2015-01-09: 15:00:00 via INTRAVENOUS

## 2015-01-09 NOTE — Progress Notes (Signed)
[facility] [phone]   [phone]  OFFICE PROGRESS NOTE  [name], NP [place] [address]  DIAGNOSIS: Recurrent non-small cell lung cancer, squamous cell carcinoma initially diagnosis stage IIIa (T1 N2 MX ) in August of 2010.   PRIOR THERAPY:  #1 status post concurrent chemoradiation with weekly carboplatin and paclitaxel, last dose of chemotherapy given 08/05/2011.  #2 status post consolidation chemotherapy with carboplatin paclitaxel last dose given 10/27/2009.  #3 Concurrent chemoradiation with weekly carboplatin for an AUC of 2 and paclitaxel at 45 mg per meter squared given concurrent with radiotherapy, last dose was given 09/20/2011.  #4 Systemic chemotherapy with gemcitabine 1000 mg meter squared on days 1 and 8 every 3 weeks status post 3 cycles with stable disease, last dose was given 12/20/2011.  #5 Systemic chemotherapy again with single agent gemcitabine 1000 mg/M2 on days 1 and 8 every 3 weeks. First cycle 11/07/2013. Status post 4 cycles. #6  Systemic chemotherapy again with single agent gemcitabine 1000 mg/M2 on days 1 and 8 every 3 weeks. First dose 07/24/2014. Discontinued today secondary to intolerance. #7  Immunotherapy with Nivolumab 3 MG/KG every 2 weeks. First dose 09/18/2014. She is status post 2 cycles. Last dose was given 10/02/2014 discontinued secondary to disease progression with significant SVC syndrome.  CURRENT THERAPY: Systemic chemotherapy with carboplatin for AUC of 5 on day 1 and gemcitabine 1000 MG/M2 on days 1 and 8 every 3 weeks. First dose 11/07/2014.  She had a reaction to the [place] skin test dose was cycle #2 carboplatin was discontinued. She proceeded with cycle #2 with gemcitabine only. Status post 3 cycles.  INTERVAL HISTORY: Janice Ball 59 y.o. female returns to the clinic today for a follow-up visit. She presents to proceed with day 1 of cycle 4 of her systemic  chemotherapy with gemcitabine. Carboplatin was discontinued with cycle #2 due to a positive skin test dose of carboplatin. She recently was positive for left upper extremity DVT and was started on Lovenox however she developed increased swelling and underwent thrombectomy on 12/01/2014. She has significantly decreased left upper extremity edema. She continues on Lovenox. She requests refill prescriptions for her Compazine and oxycodone 5/325 mg. She was no other specific complaints today. She denied fever, chills, shortness of breath, cough or hemoptysis. Denied nausea, vomiting, diarrhea or constipation. She's had no significant weight loss or night sweats.  MEDICAL HISTORY: Past Medical History  Diagnosis Date  . Anxiety   . Hyperlipidemia   . Hypothyroidism   . COPD (chronic obstructive pulmonary disease)   . Arthritis     thumbs  . 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 Diabetes mellitus   . Lung cancer dx'd 05/2009  . Lung cancer dx'd 09/2013    recurrence in LN  . 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     ALLERGIES:  is allergic to carboplatin; sulfonamide derivatives; codeine; dilaudid; penicillins; and vicodin.  MEDICATIONS:  Current Outpatient Prescriptions  Medication Sig Dispense Refill  . acetaminophen (TYLENOL) 325 MG tablet Take 650 mg by mouth every 6 (six) hours as needed for mild pain or headache.     . alendronate (FOSAMAX) 10 MG tablet Take 10 mg by mouth daily.     Marland Kitchen aspirin 81 MG tablet Take 1 tablet (81 mg total) by mouth daily with breakfast. 30 tablet 0  . budesonide-formoterol (SYMBICORT) 160-4.5 MCG/ACT inhaler Inhale 2 puffs  into the lungs 2 (two) times daily.    . cholecalciferol (VITAMIN D) 1000 UNITS tablet Take 1,000 Units by mouth every morning.     . cyanocobalamin 1000 MCG tablet Take 1,000 mcg by mouth every morning.     . enoxaparin (LOVENOX) 120 MG/0.8ML injection Inject 0.8 mLs (120 mg  total) into the skin daily. 30 Syringe 1  . glimepiride (AMARYL) 2 MG tablet Take 2 mg by mouth daily with breakfast.     . guaiFENesin (MUCINEX) 600 MG 12 hr tablet Take 1,200 mg by mouth 2 (two) times daily.    Marland Kitchen ipratropium (ATROVENT) 0.02 % nebulizer solution Take 2.5 mLs (0.5 mg total) by nebulization 4 (four) times daily. 150 mL 4  . levothyroxine (SYNTHROID, LEVOTHROID) 125 MCG tablet Take 125 mcg by mouth daily before breakfast.     . lidocaine-prilocaine (EMLA) cream Apply 1 application topically as needed. Apply to port 1 hr before chemo 30 g 0  . LORazepam (ATIVAN) 1 MG tablet Take 1 mg by mouth 3 (three) times daily as needed for anxiety.     . magnesium gluconate (MAGONATE) 500 MG tablet Take 500 mg by mouth every morning.     . metFORMIN (GLUCOPHAGE) 1000 MG tablet Take 1,000 mg by mouth 2 (two) times daily with a meal.    . Multiple Vitamin (MULTIVITAMIN WITH MINERALS) TABS tablet Take 1 tablet by mouth every morning.    Marland Kitchen OVER THE COUNTER MEDICATION Take 1 capsule by mouth daily. MSM. For energy and joint pain    . oxyCODONE (OXY IR/ROXICODONE) 5 MG immediate release tablet Take 1 tablet (5 mg total) by mouth every 4 (four) hours as needed for moderate pain. 60 tablet 0  . PROAIR HFA 108 (90 BASE) MCG/ACT inhaler INHALE 2 PUFFS INTO THE LUNGS EVERY 6 (SIX) HOURS AS NEEDED FOR WHEEZING OR SHORTNESS OF BREATH. 8.5 each 2  . prochlorperazine (COMPAZINE) 10 MG tablet Take 1 tablet (10 mg total) by mouth every 6 (six) hours as needed for nausea or vomiting. 30 tablet 1  . sertraline (ZOLOFT) 100 MG tablet Take 150 mg by mouth at bedtime.     Marland Kitchen spironolactone (ALDACTONE) 25 MG tablet Take 25 mg by mouth every morning.     Marland Kitchen albuterol (PROVENTIL HFA;VENTOLIN HFA) 108 (90 BASE) MCG/ACT inhaler Inhale 1 puff into the lungs every 6 (six) hours as needed for wheezing or shortness of breath.    . docusate sodium 100 MG CAPS Take 100 mg by mouth 2 (two) times daily. (Patient not taking: Reported  on 01/09/2015) 10 capsule 0  . saccharomyces boulardii (FLORASTOR) 250 MG capsule Take 1 capsule (250 mg total) by mouth 2 (two) times daily. (Patient not taking: Reported on 01/09/2015) 30 capsule 0   No current facility-administered medications for this visit.   Facility-Administered Medications Ordered in Other Visits  Medication Dose Route Frequency Provider Last Rate Last Dose  . Gemcitabine HCl (GEMZAR) 1,938 mg in sodium chloride 0.9 % 100 mL chemo infusion  1,000 mg/m2 (Treatment Plan Actual) Intravenous Once Curt Bears, MD 302 mL/hr at 01/09/15 1524 1,938 mg at 01/09/15 1524  . heparin lock flush 100 unit/mL  500 Units Intracatheter Once PRN Curt Bears, MD      . sodium chloride 0.9 % injection 10 mL  10 mL Intracatheter PRN Curt Bears, MD   10 mL at 11/07/14 1558  . sodium chloride 0.9 % injection 10 mL  10 mL Intracatheter PRN Curt Bears, MD  SURGICAL HISTORY:  Past Surgical History  Procedure Laterality Date  . Thyroidectomy, partial    . Tonsillectomy    . Tubal ligation    . Partial hysterectomy      REVIEW OF SYSTEMS:  Constitutional: positive for fatigue Eyes: negative Ears, nose, mouth, throat, and face: negative Respiratory: negative Cardiovascular: negative Gastrointestinal: negative Genitourinary:negative Integument/breast: negative Hematologic/lymphatic: negative Musculoskeletal:positive for Decreased swelling and warmth of the left upper extremity extending into the hand Neurological: negative Behavioral/Psych: negative Endocrine: negative Allergic/Immunologic: negative   PHYSICAL EXAMINATION: General appearance: alert, cooperative and no distress Head: Normocephalic, without obvious abnormality, atraumatic Neck: no adenopathy, no JVD, supple, symmetrical, trachea midline and thyroid not enlarged, symmetric, no tenderness/mass/nodules Lymph nodes: Cervical, supraclavicular, and axillary nodes normal. Resp: wheezes bilaterally and  Minimal Back: symmetric, no curvature. ROM normal. No CVA tenderness. Cardio: regular rate and rhythm, S1, S2 normal, no murmur, click, rub or gallop GI: soft, non-tender; bowel sounds normal; no masses,  no organomegaly Extremities: edema Trace to 1++ edema left upper extremity with decreased warmth and tenderness particularly in the upper biceps area Neurologic: Alert and oriented X 3, normal strength and tone. Normal symmetric reflexes. Normal coordination and gait  ECOG PERFORMANCE STATUS: 1 - Symptomatic but completely ambulatory  Blood pressure 114/56, pulse 110, temperature 98.3 F (36.8 C), temperature source Oral, resp. rate 18, height 5\' 7"  (1.702 m), weight 171 lb 1.6 oz (77.61 kg), SpO2 99 %.  LABORATORY DATA: Lab Results  Component Value Date   WBC 5.5 01/09/2015   HGB 10.7* 01/09/2015   HCT 33.9* 01/09/2015   MCV 86.7 01/09/2015   PLT 262 01/09/2015      Chemistry      Component Value Date/Time   NA 139 01/09/2015 1329   NA 137 12/05/2014 0500   NA 141 04/04/2012 0945   K 4.4 01/09/2015 1329   K 3.9 12/05/2014 0500   K 4.7 04/04/2012 0945   CL 101 12/05/2014 0500   CL 101 04/06/2013 1041   CL 100 04/04/2012 0945   CO2 25 01/09/2015 1329   CO2 30 12/05/2014 0500   CO2 29 04/04/2012 0945   BUN 9.0 01/09/2015 1329   BUN <5* 12/05/2014 0500   BUN 16 04/04/2012 0945   CREATININE 0.8 01/09/2015 1329   CREATININE 0.53 12/05/2014 0500   CREATININE 0.9 04/04/2012 0945      Component Value Date/Time   CALCIUM 8.4 01/09/2015 1329   CALCIUM 8.2* 12/05/2014 0500   CALCIUM 8.4 04/04/2012 0945   ALKPHOS 60 01/09/2015 1329   ALKPHOS 69 11/04/2014 0435   ALKPHOS 61 04/04/2012 0945   AST 25 01/09/2015 1329   AST 19 11/04/2014 0435   AST 31 04/04/2012 0945   ALT 20 01/09/2015 1329   ALT 13 11/04/2014 0435   ALT 30 04/04/2012 0945   BILITOT 0.37 01/09/2015 1329   BILITOT 0.6 11/04/2014 0435   BILITOT 0.50 04/04/2012 0945       RADIOGRAPHIC STUDIES: US Venous  Img Upper Uni Left  01/07/2015   CLINICAL DATA:  Recurrent non-small cell lung carcinoma in the right upper lobe. SVC stent placed 10/17/2014. Patient presented with extensive left upper extremity DVT and underwent tPA assisted mechanical thrombectomy 11/21/2014. She returns for scheduled follow-up. Her left arm swelling has resolved and she is currently asymptomatic. She is currently on Lovenox , with plans to remain on lifelong anticoagulation.  EXAM: LEFT UPPER EXTREMITY VENOUS DOPPLER ULTRASOUND  TECHNIQUE: Gray-scale sonography with graded compression, as well as color  Doppler and duplex ultrasound were performed to evaluate the upper extremity deep venous system from the level of the subclavian vein and including the jugular, axillary, basilic and upper cephalic vein. Spectral Doppler was utilized to evaluate flow at rest and with distal augmentation maneuvers.  COMPARISON:  12/01/2014 AND EARLIER STUDIES  FINDINGS: Thrombus within deep veins:  None visualized.  Compressibility of deep veins:  Normal.  Duplex waveform respiratory phasicity:  Normal.  Duplex waveform response to augmentation:  Normal.  Venous reflux:  None visualized.  Other findings: Transmitted right atrial pulsations in the left ij and subclavian veins imply patency of the central venous structures.  IMPRESSION: 1. No residual or recurrent left upper extremity DVT.   Electronically Signed   By: Lucrezia Europe M.D.   On: 01/07/2015 14:09   ASSESSMENT AND PLAN: This is a very pleasant 59 years old white female with recurrent non-small cell lung cancer status post concurrent chemoradiation as well as consolidation chemotherapy and she is currently on systemic chemotherapy with single agent gemcitabine status post 4 cycles.  She was treated again with a course of systemic chemotherapy with single agent gemcitabine for 3 cycles but the patient has rough time tolerating her treatment with significant fatigue and weakness. The recent CT scan of the  chest, abdomen and pelvis showed stable disease but the patient continues to have the persistent right upper lobe area of mass fibrosis and consolidation as well as persistent right hilar, subcarinal and right paratracheal lymphadenopathy as well as, one area of masslike soft tissue immediately anterior to the carina. She was started on treatment with Nivolumab status post 2 cycles. She tolerated the treatment well but she presented with evidence for disease progression in the chest. This treatment was discontinued. 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 Also the most recent CT angiogram of the chest showed further disease progression. She is currently being treated with systemic chemotherapy with carboplatin for AUC of 5 on day 1 and gemcitabine 1000 MG/M2 on days 1 and 8 every 3 weeks. Status post 1 cycle. Starting with cycle #2 she was treated with gemcitabine only due to a positive carboplatin skin test. She will continue on Lovenox as previously prescribed for her left upper extremity DVT. She had a CT angiography of the chest recently we will postpone her restaging CT scan until after cycle #4 or perhaps cycle #5. She'll proceed with cycle #4 consisting of gemcitabine only today as scheduled. She will follow-up in 3 weeks prior to the start of cycle #5.   She was provided reveals for her compazine and oxycodone. She was advised to call immediately if she has any concerning symptoms in the interval.  All questions were answered. The patient knows to call the clinic with any problems, questions or concerns. We can certainly see the patient much sooner if necessary.  Carlton Adam, PA-C 01/09/2015

## 2015-01-09 NOTE — Patient Instructions (Signed)
[facility] Discharge Instructions for Patients Receiving Chemotherapy  Today you received the following chemotherapy agents Gemzar  To help prevent nausea and vomiting after your treatment, we encourage you to take your nausea medication as needed   If you develop nausea and vomiting that is not controlled by your nausea medication, call the clinic.   BELOW ARE SYMPTOMS THAT SHOULD BE REPORTED IMMEDIATELY:  *FEVER GREATER THAN 100.5 F  *CHILLS WITH OR WITHOUT FEVER  NAUSEA AND VOMITING THAT IS NOT CONTROLLED WITH YOUR NAUSEA MEDICATION  *UNUSUAL SHORTNESS OF BREATH  *UNUSUAL BRUISING OR BLEEDING  TENDERNESS IN MOUTH AND THROAT WITH OR WITHOUT PRESENCE OF ULCERS  *URINARY PROBLEMS  *BOWEL PROBLEMS  UNUSUAL RASH Items with * indicate a potential emergency and should be followed up as soon as possible.  Feel free to call the clinic you have any questions or concerns. The clinic phone number is [phone].  Please show the CHEMO ALERT CARD at check-in to the Emergency Department and triage nurse.   

## 2015-01-09 NOTE — Telephone Encounter (Signed)
gave and prnted appt sched and avs fo rpt for March adn April

## 2015-01-12 NOTE — Patient Instructions (Signed)
Follow-up in 3 weeks

## 2015-01-16 ENCOUNTER — Other Ambulatory Visit (HOSPITAL_BASED_OUTPATIENT_CLINIC_OR_DEPARTMENT_OTHER): Payer: Medicare Other

## 2015-01-16 ENCOUNTER — Ambulatory Visit (HOSPITAL_BASED_OUTPATIENT_CLINIC_OR_DEPARTMENT_OTHER): Payer: Medicare Other

## 2015-01-16 ENCOUNTER — Other Ambulatory Visit: Payer: Self-pay | Admitting: Internal Medicine

## 2015-01-16 DIAGNOSIS — Z5111 Encounter for antineoplastic chemotherapy: Secondary | ICD-10-CM | POA: Diagnosis not present

## 2015-01-16 DIAGNOSIS — C3411 Malignant neoplasm of upper lobe, right bronchus or lung: Secondary | ICD-10-CM

## 2015-01-16 LAB — CBC WITH DIFFERENTIAL/PLATELET
BASO%: 2.2 % — ABNORMAL HIGH (ref 0.0–2.0)
Basophils Absolute: 0.1 10*3/uL (ref 0.0–0.1)
EOS%: 0.9 % (ref 0.0–7.0)
Eosinophils Absolute: 0 10*3/uL (ref 0.0–0.5)
HCT: 32.1 % — ABNORMAL LOW (ref 34.8–46.6)
HGB: 10.4 g/dL — ABNORMAL LOW (ref 11.6–15.9)
LYMPH#: 0.6 10*3/uL — AB (ref 0.9–3.3)
LYMPH%: 24.9 % (ref 14.0–49.7)
MCH: 28.2 pg (ref 25.1–34.0)
MCHC: 32.4 g/dL (ref 31.5–36.0)
MCV: 87 fL (ref 79.5–101.0)
MONO#: 0.5 10*3/uL (ref 0.1–0.9)
MONO%: 20.1 % — ABNORMAL HIGH (ref 0.0–14.0)
NEUT#: 1.2 10*3/uL — ABNORMAL LOW (ref 1.5–6.5)
NEUT%: 51.9 % (ref 38.4–76.8)
NRBC: 2 % — AB (ref 0–0)
Platelets: 198 10*3/uL (ref 145–400)
RBC: 3.69 10*6/uL — AB (ref 3.70–5.45)
RDW: 20.4 % — ABNORMAL HIGH (ref 11.2–14.5)
WBC: 2.3 10*3/uL — AB (ref 3.9–10.3)

## 2015-01-16 LAB — COMPREHENSIVE METABOLIC PANEL (CC13)
ALK PHOS: 67 U/L (ref 40–150)
ALT: 33 U/L (ref 0–55)
AST: 31 U/L (ref 5–34)
Albumin: 3.3 g/dL — ABNORMAL LOW (ref 3.5–5.0)
Anion Gap: 13 mEq/L — ABNORMAL HIGH (ref 3–11)
BILIRUBIN TOTAL: 0.28 mg/dL (ref 0.20–1.20)
BUN: 7.1 mg/dL (ref 7.0–26.0)
CO2: 22 mEq/L (ref 22–29)
Calcium: 8 mg/dL — ABNORMAL LOW (ref 8.4–10.4)
Chloride: 104 mEq/L (ref 98–109)
Creatinine: 0.7 mg/dL (ref 0.6–1.1)
GLUCOSE: 215 mg/dL — AB (ref 70–140)
Potassium: 4.2 mEq/L (ref 3.5–5.1)
SODIUM: 140 meq/L (ref 136–145)
Total Protein: 6.6 g/dL (ref 6.4–8.3)

## 2015-01-16 MED ORDER — SODIUM CHLORIDE 0.9 % IV SOLN
1000.0000 mg/m2 | Freq: Once | INTRAVENOUS | Status: AC
  Administered 2015-01-16: 1938 mg via INTRAVENOUS
  Filled 2015-01-16: qty 50.97

## 2015-01-16 MED ORDER — SODIUM CHLORIDE 0.9 % IV SOLN
Freq: Once | INTRAVENOUS | Status: AC
  Administered 2015-01-16: 14:00:00 via INTRAVENOUS

## 2015-01-16 MED ORDER — HEPARIN SOD (PORK) LOCK FLUSH 100 UNIT/ML IV SOLN
500.0000 [IU] | Freq: Once | INTRAVENOUS | Status: AC | PRN
  Administered 2015-01-16: 500 [IU]
  Filled 2015-01-16: qty 5

## 2015-01-16 MED ORDER — SODIUM CHLORIDE 0.9 % IJ SOLN
10.0000 mL | INTRAMUSCULAR | Status: DC | PRN
  Administered 2015-01-16: 10 mL
  Filled 2015-01-16: qty 10

## 2015-01-16 MED ORDER — PROCHLORPERAZINE MALEATE 10 MG PO TABS
10.0000 mg | ORAL_TABLET | Freq: Once | ORAL | Status: AC
  Administered 2015-01-16: 10 mg via ORAL

## 2015-01-16 MED ORDER — PROCHLORPERAZINE MALEATE 10 MG PO TABS
ORAL_TABLET | ORAL | Status: AC
  Filled 2015-01-16: qty 1

## 2015-01-16 NOTE — Progress Notes (Signed)
ABS Neut at 1.2 today,  Verbal consent given by Dr. [name] to treat with today lab results.

## 2015-01-16 NOTE — Patient Instructions (Signed)
[facility] Discharge Instructions for Patients Receiving Chemotherapy  Today you received the following chemotherapy agents Gemzar  To help prevent nausea and vomiting after your treatment, we encourage you to take your nausea medication as needed   If you develop nausea and vomiting that is not controlled by your nausea medication, call the clinic.   BELOW ARE SYMPTOMS THAT SHOULD BE REPORTED IMMEDIATELY:  *FEVER GREATER THAN 100.5 F  *CHILLS WITH OR WITHOUT FEVER  NAUSEA AND VOMITING THAT IS NOT CONTROLLED WITH YOUR NAUSEA MEDICATION  *UNUSUAL SHORTNESS OF BREATH  *UNUSUAL BRUISING OR BLEEDING  TENDERNESS IN MOUTH AND THROAT WITH OR WITHOUT PRESENCE OF ULCERS  *URINARY PROBLEMS  *BOWEL PROBLEMS  UNUSUAL RASH Items with * indicate a potential emergency and should be followed up as soon as possible.  Feel free to call the clinic you have any questions or concerns. The clinic phone number is [phone].  Please show the CHEMO ALERT CARD at check-in to the Emergency Department and triage nurse.   

## 2015-01-18 ENCOUNTER — Encounter (HOSPITAL_COMMUNITY): Payer: Self-pay | Admitting: Oncology

## 2015-01-18 ENCOUNTER — Emergency Department (HOSPITAL_COMMUNITY): Payer: Medicare Other

## 2015-01-18 ENCOUNTER — Inpatient Hospital Stay (HOSPITAL_COMMUNITY)
Admission: EM | Admit: 2015-01-18 | Discharge: 2015-01-24 | DRG: 871 | Disposition: A | Discharge status: Home Health | Payer: Medicare Other | Attending: Internal Medicine | Admitting: Internal Medicine

## 2015-01-18 DIAGNOSIS — E1165 Type 2 diabetes mellitus with hyperglycemia: Secondary | ICD-10-CM

## 2015-01-18 DIAGNOSIS — J189 Pneumonia, unspecified organism: Secondary | ICD-10-CM

## 2015-01-18 DIAGNOSIS — R0602 Shortness of breath: Secondary | ICD-10-CM

## 2015-01-18 DIAGNOSIS — Z79891 Long term (current) use of opiate analgesic: Secondary | ICD-10-CM | POA: Diagnosis not present

## 2015-01-18 DIAGNOSIS — E038 Other specified hypothyroidism: Secondary | ICD-10-CM | POA: Diagnosis not present

## 2015-01-18 DIAGNOSIS — E876 Hypokalemia: Secondary | ICD-10-CM | POA: Diagnosis present

## 2015-01-18 DIAGNOSIS — Z23 Encounter for immunization: Secondary | ICD-10-CM

## 2015-01-18 DIAGNOSIS — E039 Hypothyroidism, unspecified: Secondary | ICD-10-CM | POA: Diagnosis present

## 2015-01-18 DIAGNOSIS — D6481 Anemia due to antineoplastic chemotherapy: Secondary | ICD-10-CM | POA: Diagnosis present

## 2015-01-18 DIAGNOSIS — Z87891 Personal history of nicotine dependence: Secondary | ICD-10-CM

## 2015-01-18 DIAGNOSIS — F419 Anxiety disorder, unspecified: Secondary | ICD-10-CM | POA: Diagnosis present

## 2015-01-18 DIAGNOSIS — Z833 Family history of diabetes mellitus: Secondary | ICD-10-CM

## 2015-01-18 DIAGNOSIS — M1990 Unspecified osteoarthritis, unspecified site: Secondary | ICD-10-CM | POA: Diagnosis present

## 2015-01-18 DIAGNOSIS — Z7951 Long term (current) use of inhaled steroids: Secondary | ICD-10-CM | POA: Diagnosis not present

## 2015-01-18 DIAGNOSIS — Z9221 Personal history of antineoplastic chemotherapy: Secondary | ICD-10-CM

## 2015-01-18 DIAGNOSIS — E119 Type 2 diabetes mellitus without complications: Secondary | ICD-10-CM | POA: Diagnosis present

## 2015-01-18 DIAGNOSIS — J449 Chronic obstructive pulmonary disease, unspecified: Secondary | ICD-10-CM | POA: Diagnosis not present

## 2015-01-18 DIAGNOSIS — A419 Sepsis, unspecified organism: Secondary | ICD-10-CM | POA: Diagnosis present

## 2015-01-18 DIAGNOSIS — R042 Hemoptysis: Secondary | ICD-10-CM | POA: Diagnosis not present

## 2015-01-18 DIAGNOSIS — T451X5A Adverse effect of antineoplastic and immunosuppressive drugs, initial encounter: Secondary | ICD-10-CM | POA: Diagnosis present

## 2015-01-18 DIAGNOSIS — R509 Fever, unspecified: Secondary | ICD-10-CM

## 2015-01-18 DIAGNOSIS — Z800 Family history of malignant neoplasm of digestive organs: Secondary | ICD-10-CM | POA: Diagnosis not present

## 2015-01-18 DIAGNOSIS — Z923 Personal history of irradiation: Secondary | ICD-10-CM | POA: Diagnosis not present

## 2015-01-18 DIAGNOSIS — D630 Anemia in neoplastic disease: Secondary | ICD-10-CM | POA: Diagnosis present

## 2015-01-18 DIAGNOSIS — Z7901 Long term (current) use of anticoagulants: Secondary | ICD-10-CM

## 2015-01-18 DIAGNOSIS — Z6826 Body mass index (BMI) 26.0-26.9, adult: Secondary | ICD-10-CM

## 2015-01-18 DIAGNOSIS — J441 Chronic obstructive pulmonary disease with (acute) exacerbation: Secondary | ICD-10-CM | POA: Diagnosis present

## 2015-01-18 DIAGNOSIS — F329 Major depressive disorder, single episode, unspecified: Secondary | ICD-10-CM | POA: Diagnosis present

## 2015-01-18 DIAGNOSIS — D61810 Antineoplastic chemotherapy induced pancytopenia: Secondary | ICD-10-CM

## 2015-01-18 DIAGNOSIS — E860 Dehydration: Secondary | ICD-10-CM

## 2015-01-18 DIAGNOSIS — C3490 Malignant neoplasm of unspecified part of unspecified bronchus or lung: Secondary | ICD-10-CM | POA: Diagnosis present

## 2015-01-18 DIAGNOSIS — C3411 Malignant neoplasm of upper lobe, right bronchus or lung: Secondary | ICD-10-CM | POA: Diagnosis present

## 2015-01-18 DIAGNOSIS — E46 Unspecified protein-calorie malnutrition: Secondary | ICD-10-CM | POA: Diagnosis present

## 2015-01-18 DIAGNOSIS — R6510 Systemic inflammatory response syndrome (SIRS) of non-infectious origin without acute organ dysfunction: Secondary | ICD-10-CM

## 2015-01-18 DIAGNOSIS — R490 Dysphonia: Secondary | ICD-10-CM

## 2015-01-18 DIAGNOSIS — J181 Lobar pneumonia, unspecified organism: Secondary | ICD-10-CM | POA: Diagnosis present

## 2015-01-18 DIAGNOSIS — Z86718 Personal history of other venous thrombosis and embolism: Secondary | ICD-10-CM

## 2015-01-18 DIAGNOSIS — F17200 Nicotine dependence, unspecified, uncomplicated: Secondary | ICD-10-CM

## 2015-01-18 DIAGNOSIS — R000 Tachycardia, unspecified: Secondary | ICD-10-CM

## 2015-01-18 DIAGNOSIS — D6959 Other secondary thrombocytopenia: Secondary | ICD-10-CM | POA: Diagnosis present

## 2015-01-18 DIAGNOSIS — E872 Acidosis, unspecified: Secondary | ICD-10-CM

## 2015-01-18 DIAGNOSIS — I871 Compression of vein: Secondary | ICD-10-CM

## 2015-01-18 DIAGNOSIS — J3489 Other specified disorders of nose and nasal sinuses: Secondary | ICD-10-CM

## 2015-01-18 DIAGNOSIS — R079 Chest pain, unspecified: Secondary | ICD-10-CM

## 2015-01-18 DIAGNOSIS — E785 Hyperlipidemia, unspecified: Secondary | ICD-10-CM | POA: Diagnosis present

## 2015-01-18 LAB — URINALYSIS, ROUTINE W REFLEX MICROSCOPIC
BILIRUBIN URINE: NEGATIVE
Glucose, UA: 500 mg/dL — AB
HGB URINE DIPSTICK: NEGATIVE
KETONES UR: NEGATIVE mg/dL
Leukocytes, UA: NEGATIVE
Nitrite: NEGATIVE
PROTEIN: NEGATIVE mg/dL
Specific Gravity, Urine: 1.017 (ref 1.005–1.030)
Urobilinogen, UA: 0.2 mg/dL (ref 0.0–1.0)
pH: 6 (ref 5.0–8.0)

## 2015-01-18 LAB — CBC WITH DIFFERENTIAL/PLATELET
Basophils Absolute: 0 10*3/uL (ref 0.0–0.1)
Basophils Relative: 0 % (ref 0–1)
EOS ABS: 0 10*3/uL (ref 0.0–0.7)
EOS PCT: 0 % (ref 0–5)
HCT: 30.9 % — ABNORMAL LOW (ref 36.0–46.0)
Hemoglobin: 10 g/dL — ABNORMAL LOW (ref 12.0–15.0)
LYMPHS ABS: 0.4 10*3/uL — AB (ref 0.7–4.0)
LYMPHS PCT: 4 % — AB (ref 12–46)
MCH: 28.7 pg (ref 26.0–34.0)
MCHC: 32.4 g/dL (ref 30.0–36.0)
MCV: 88.8 fL (ref 78.0–100.0)
MONO ABS: 0 10*3/uL — AB (ref 0.1–1.0)
Monocytes Relative: 1 % — ABNORMAL LOW (ref 3–12)
NEUTROS ABS: 7.7 10*3/uL (ref 1.7–7.7)
Neutrophils Relative %: 95 % — ABNORMAL HIGH (ref 43–77)
Platelets: 142 10*3/uL — ABNORMAL LOW (ref 150–400)
RBC: 3.48 MIL/uL — AB (ref 3.87–5.11)
RDW: 20.8 % — AB (ref 11.5–15.5)
WBC: 8.1 10*3/uL (ref 4.0–10.5)

## 2015-01-18 LAB — COMPREHENSIVE METABOLIC PANEL
ALK PHOS: 59 U/L (ref 39–117)
ALT: 29 U/L (ref 0–35)
AST: 36 U/L (ref 0–37)
Albumin: 3.6 g/dL (ref 3.5–5.2)
Anion gap: 13 (ref 5–15)
BILIRUBIN TOTAL: 0.5 mg/dL (ref 0.3–1.2)
BUN: 9 mg/dL (ref 6–23)
CO2: 20 mmol/L (ref 19–32)
Calcium: 8.3 mg/dL — ABNORMAL LOW (ref 8.4–10.5)
Chloride: 99 mmol/L (ref 96–112)
Creatinine, Ser: 0.78 mg/dL (ref 0.50–1.10)
GFR calc non Af Amer: 89 mL/min — ABNORMAL LOW (ref >90)
GLUCOSE: 319 mg/dL — AB (ref 70–99)
Potassium: 3.8 mmol/L (ref 3.5–5.1)
Sodium: 132 mmol/L — ABNORMAL LOW (ref 135–145)
Total Protein: 6.8 g/dL (ref 6.0–8.3)

## 2015-01-18 LAB — CBG MONITORING, ED: GLUCOSE-CAPILLARY: 197 mg/dL — AB (ref 70–99)

## 2015-01-18 LAB — I-STAT CG4 LACTIC ACID, ED: Lactic Acid, Venous: 4.66 mmol/L (ref 0.5–2.0)

## 2015-01-18 MED ORDER — VANCOMYCIN HCL IN DEXTROSE 750-5 MG/150ML-% IV SOLN
750.0000 mg | Freq: Three times a day (TID) | INTRAVENOUS | Status: DC
  Administered 2015-01-18 – 2015-01-20 (×6): 750 mg via INTRAVENOUS
  Filled 2015-01-18 (×8): qty 150

## 2015-01-18 MED ORDER — INSULIN ASPART 100 UNIT/ML ~~LOC~~ SOLN
0.0000 [IU] | Freq: Every day | SUBCUTANEOUS | Status: DC
  Administered 2015-01-21: 2 [IU] via SUBCUTANEOUS
  Administered 2015-01-22: 3 [IU] via SUBCUTANEOUS
  Administered 2015-01-23: 5 [IU] via SUBCUTANEOUS

## 2015-01-18 MED ORDER — SODIUM CHLORIDE 0.9 % IV SOLN: Freq: Once | INTRAVENOUS | Status: DC

## 2015-01-18 MED ORDER — DEXTROSE 5 % IV SOLN
2.0000 g | Freq: Three times a day (TID) | INTRAVENOUS | Status: DC
  Administered 2015-01-18 – 2015-01-20 (×6): 2 g via INTRAVENOUS
  Filled 2015-01-18 (×7): qty 2

## 2015-01-18 MED ORDER — DEXTROSE 5 % IV SOLN
1.0000 g | Freq: Three times a day (TID) | INTRAVENOUS | Status: DC
  Filled 2015-01-18 (×2): qty 1

## 2015-01-18 MED ORDER — INSULIN ASPART 100 UNIT/ML ~~LOC~~ SOLN
0.0000 [IU] | Freq: Three times a day (TID) | SUBCUTANEOUS | Status: DC
  Administered 2015-01-19: 3 [IU] via SUBCUTANEOUS
  Administered 2015-01-19: 5 [IU] via SUBCUTANEOUS
  Administered 2015-01-20 (×3): 3 [IU] via SUBCUTANEOUS
  Administered 2015-01-21: 5 [IU] via SUBCUTANEOUS
  Administered 2015-01-21 – 2015-01-22 (×2): 3 [IU] via SUBCUTANEOUS
  Administered 2015-01-22: 11 [IU] via SUBCUTANEOUS
  Administered 2015-01-22 – 2015-01-23 (×2): 5 [IU] via SUBCUTANEOUS
  Administered 2015-01-23: 8 [IU] via SUBCUTANEOUS
  Administered 2015-01-23: 5 [IU] via SUBCUTANEOUS
  Administered 2015-01-24: 2 [IU] via SUBCUTANEOUS
  Administered 2015-01-24: 5 [IU] via SUBCUTANEOUS

## 2015-01-18 MED ORDER — FENTANYL CITRATE 0.05 MG/ML IJ SOLN
50.0000 ug | Freq: Once | INTRAMUSCULAR | Status: AC
  Administered 2015-01-18: 50 ug via INTRAVENOUS
  Filled 2015-01-18: qty 2

## 2015-01-18 MED ORDER — SODIUM CHLORIDE 0.9 % IV SOLN
INTRAVENOUS | Status: AC
  Administered 2015-01-18 – 2015-01-19 (×2): via INTRAVENOUS

## 2015-01-18 NOTE — ED Notes (Signed)
Notified edp beaton and rn terrence of critical lactic acid value

## 2015-01-18 NOTE — ED Provider Notes (Signed)
CSN: [id number]     Arrival date & time 01/18/15  2057 History   First MD Initiated Contact with Patient 01/18/15 2133     Chief Complaint  Patient presents with  . Fever      HPI Per pt she began feeling SOB, fever/chills and cough this afternoon. Pt reports small amount of blood when she coughs. Past Medical History  Diagnosis Date  . Anxiety   . Hyperlipidemia   . Hypothyroidism   . COPD (chronic obstructive pulmonary disease)   . Arthritis     thumbs  . 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 Diabetes mellitus   . Lung cancer dx'd 05/2009  . Lung cancer dx'd 09/2013    recurrence in LN  . 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 Thyroidectomy, partial    . Tonsillectomy    . Tubal ligation    . Partial hysterectomy     Family History  Problem Relation Age of Onset  . Colon cancer Mother   . Cancer Mother     colon  . Diabetes type II Father    History  Substance Use Topics  . Smoking status: Former Smoker -- 0.50 packs/day for 45 years    Types: Cigarettes    Quit date: 11/03/2014  . Smokeless tobacco: Never Used     Comment: pt. started back smoking 10/18/2012, half a pack a day  . Alcohol Use: No   OB History    No data available     Review of Systems All other systems reviewed and are negative   Allergies  Carboplatin; Sulfonamide derivatives; Codeine; Dilaudid; Penicillins; and Vicodin  Home Medications   Prior to Admission medications   Medication Sig Start Date End Date Taking? Authorizing Provider  acetaminophen (TYLENOL) 325 MG tablet Take 650 mg by mouth every 6 (six) hours as needed for mild pain or headache.    Yes Historical Provider, MD  alendronate (FOSAMAX) 10 MG tablet Take 10 mg by mouth daily.  03/20/14  Yes Historical Provider, MD  budesonide-formoterol (SYMBICORT) 160-4.5 MCG/ACT inhaler Inhale 2 puffs into the lungs 2  (two) times daily.   Yes Historical Provider, MD  cholecalciferol (VITAMIN D) 1000 UNITS tablet Take 1,000 Units by mouth every morning.    Yes Historical Provider, MD  [facility] ASPIRIN ADULT LOW DOSE 81 MG chewable tablet Chew 81 mg by mouth daily. 12/06/14  Yes Historical Provider, MD  cyanocobalamin 1000 MCG tablet Take 1,000 mcg by mouth every morning.    Yes Historical Provider, MD  enoxaparin (LOVENOX) 120 MG/0.8ML injection Inject 0.8 mLs (120 mg total) into the skin daily. 12/06/14  Yes Thurnell Lose, MD  glimepiride (AMARYL) 2 MG tablet Take 2 mg by mouth daily with breakfast.  12/21/13  Yes Historical Provider, MD  guaiFENesin (MUCINEX) 600 MG 12 hr tablet Take 1,200 mg by mouth 2 (two) times daily.   Yes Historical Provider, MD  ipratropium (ATROVENT) 0.02 % nebulizer solution Take 2.5 mLs (0.5 mg total) by nebulization 4 (four) times daily. 03/05/14  Yes Brand Males, MD  levothyroxine (SYNTHROID, LEVOTHROID) 137 MCG tablet Take 137 mcg by mouth daily.  01/15/15  Yes Historical Provider, MD  lidocaine-prilocaine (EMLA) cream Apply 1 application topically as needed. Apply to port 1 hr before chemo 11/06/14  Yes Curt Bears, MD  LORazepam (ATIVAN) 1 MG tablet Take 1 mg by mouth  3 (three) times daily as needed for anxiety.    Yes Historical Provider, MD  magnesium gluconate (MAGONATE) 500 MG tablet Take 500 mg by mouth every morning.    Yes Historical Provider, MD  metFORMIN (GLUCOPHAGE) 1000 MG tablet Take 1,000 mg by mouth 2 (two) times daily with a meal.   Yes Historical Provider, MD  Multiple Vitamin (MULTIVITAMIN WITH MINERALS) TABS tablet Take 1 tablet by mouth every morning.   Yes Historical Provider, MD  OVER THE COUNTER MEDICATION Take 1 capsule by mouth daily. MSM. For energy and joint pain   Yes Historical Provider, MD  oxyCODONE (OXY IR/ROXICODONE) 5 MG immediate release tablet Take 1 tablet (5 mg total) by mouth every 4 (four) hours as needed for moderate pain. 01/09/15  Yes Adrena  E Johnson, PA-C  PROAIR HFA 108 (90 BASE) MCG/ACT inhaler INHALE 2 PUFFS INTO THE LUNGS EVERY 6 (SIX) HOURS AS NEEDED FOR WHEEZING OR SHORTNESS OF BREATH. 10/08/14  Yes Brand Males, MD  prochlorperazine (COMPAZINE) 10 MG tablet Take 1 tablet (10 mg total) by mouth every 6 (six) hours as needed for nausea or vomiting. 01/09/15  Yes Adrena E Johnson, PA-C  sertraline (ZOLOFT) 100 MG tablet Take 150 mg by mouth at bedtime.    Yes Historical Provider, MD  spironolactone (ALDACTONE) 25 MG tablet Take 25 mg by mouth every morning.    Yes Historical Provider, MD  aspirin 81 MG tablet Take 1 tablet (81 mg total) by mouth daily with breakfast. Patient not taking: Reported on 01/18/2015 12/06/14   Thurnell Lose, MD  docusate sodium 100 MG CAPS Take 100 mg by mouth 2 (two) times daily. Patient not taking: Reported on 01/09/2015 11/05/14   Reyne Dumas, MD  saccharomyces boulardii (FLORASTOR) 250 MG capsule Take 1 capsule (250 mg total) by mouth 2 (two) times daily. Patient not taking: Reported on 01/09/2015 12/06/14   Thurnell Lose, MD   BP 136/69 mmHg  Pulse 130  Temp(Src) 99.6 F (37.6 C) (Oral)  Resp 24  Ht 5\' 7"  (1.702 m)  Wt 171 lb (77.565 kg)  BMI 26.78 kg/m2  SpO2 95% Physical Exam  Constitutional: She is oriented to person, place, and time. She appears well-developed and well-nourished. No distress.  HENT:  Head: Normocephalic and atraumatic.  Eyes: Pupils are equal, round, and reactive to light.  Neck: Normal range of motion.  Cardiovascular: Normal rate and intact distal pulses.   Pulmonary/Chest: No respiratory distress. She has wheezes (scattered).  Abdominal: Normal appearance. She exhibits no distension. There is no tenderness. There is no rebound and no guarding.  Musculoskeletal: Normal range of motion.  Neurological: She is alert and oriented to person, place, and time. No cranial nerve deficit.  Skin: Skin is warm and dry. No rash noted.  Psychiatric: She has a normal mood  and affect. Her behavior is normal.  Nursing note and vitals reviewed.   ED Course  Procedures (including critical care time) Labs Review Labs Reviewed  CBC WITH DIFFERENTIAL/PLATELET - Abnormal; Notable for the following:    RBC 3.48 (*)    Hemoglobin 10.0 (*)    HCT 30.9 (*)    RDW 20.8 (*)    Platelets 142 (*)    Neutrophils Relative % 95 (*)    Lymphocytes Relative 4 (*)    Lymphs Abs 0.4 (*)    Monocytes Relative 1 (*)    Monocytes Absolute 0.0 (*)    All other components within normal limits  COMPREHENSIVE METABOLIC PANEL -  Abnormal; Notable for the following:    Sodium 132 (*)    Glucose, Bld 319 (*)    Calcium 8.3 (*)    GFR calc non Af Amer 89 (*)    All other components within normal limits  URINALYSIS, ROUTINE W REFLEX MICROSCOPIC - Abnormal; Notable for the following:    Glucose, UA 500 (*)    All other components within normal limits  I-STAT CG4 LACTIC ACID, ED - Abnormal; Notable for the following:    Lactic Acid, Venous 4.66 (*)    All other components within normal limits  URINE CULTURE  CULTURE, BLOOD (ROUTINE X 2)  CULTURE, BLOOD (ROUTINE X 2)  GRAM STAIN  CULTURE, EXPECTORATED SPUTUM-ASSESSMENT  HIV ANTIBODY (ROUTINE TESTING)  LEGIONELLA ANTIGEN, URINE  STREP PNEUMONIAE URINARY ANTIGEN    Imaging Review Dg Chest 2 View  01/18/2015   CLINICAL DATA:  Fever and shortness breath for 1 day, chronic cough. History of diabetes, COPD and lung cancer.  EXAM: CHEST  2 VIEW  COMPARISON:  Chest radiograph December 05, 2013  FINDINGS: Cardiac silhouette is unremarkable. RIGHT upper lobe spiculated mass again seen with single-lumen RIGHT chest Port-A-Cath in place, distal tip at superior vena cava. Vascular stent in RIGHT lung. Mild chronic interstitial changes, similar without pleural effusion or focal consolidation, similar biapical pleural thickening. Flattened hemidiaphragms. No pneumothorax. Soft tissue planes and included osseous structures are nonsuspicious.  Mild scoliosis.  IMPRESSION: Stable appearance of the chest, no acute cardiopulmonary process.  COPD, stable RIGHT upper lobe spiculated mass.   Electronically Signed   By: Elon Alas   On: 01/18/2015 22:49     EKG Interpretation None      MDM   Final diagnoses:  Fever, unspecified fever cause  Malignant neoplasm of lung, unspecified laterality, unspecified part of lung        Leonard Schwartz, MD 01/18/15 2309

## 2015-01-18 NOTE — ED Notes (Signed)
Per pt she began feeling SOB, fever/chills and cough this afternoon.  Pt reports small amount of blood when she coughs.

## 2015-01-18 NOTE — H&P (Signed)
Patient Demographics  Janice Ball, is a 59 y.o. female  MRN: [id number]   DOB - April 10, 1956  Admit Date - 01/18/2015  Outpatient Primary MD for the patient is [name], NP   With History of -  Past Medical History  Diagnosis Date  . Anxiety   . Hyperlipidemia   . Hypothyroidism   . COPD (chronic obstructive pulmonary disease)   . Arthritis     thumbs  . 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 Diabetes mellitus   . Lung cancer dx'd 05/2009  . Lung cancer dx'd 09/2013    recurrence in LN  . 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 Thyroidectomy, partial    . Tonsillectomy    . Tubal ligation    . Partial hysterectomy      in for   Chief Complaint  Patient presents with  . Fever     HPI  Janice Ball  is a 59 y.o. female,  with history of right upper lobe non-small cell cancer since 2010, was initially under control but has now reoccurred and she is undergoing chemotherapy under the care of Dr. [name] last chemotherapy last Thursday, history of left upper arm DVT on Lovenox indefinitely, history of intermittent hemoptysis, baseline tachycardia with heart rate around 110 at baseline, hypothyroidism, type 2 diabetes mellitus, history of smoking quit in December of last year, dyslipidemia, COPD who comes to the hospital with 1 day history of productive cough, shortness of breath and fever. He does have intermittent hemoptysis and sometimes her phlegm has streaks of blood in it. Denies any exposure to sick contacts or recent travel, no body aches, came to the ER where her workup was consistent with sepsis and I was called to admit the patient.   Patient besides above dictated complains  is symptom-free.    Review of Systems    In addition to the HPI above,   + Fever-chills, No Headache, No changes with Vision or hearing, No problems swallowing food or Liquids, No Chest pain, ++ productive Cough & Shortness of Breath, No Abdominal pain, No Nausea or Vommitting, Bowel movements are regular, No Blood in stool or Urine, No dysuria, No new skin rashes or bruises, No new joints pains-aches,  No new weakness, tingling, numbness in any extremity, No recent weight gain or loss, No polyuria, polydypsia or polyphagia, No significant Mental Stressors.  A full 10 point Review of Systems was done, except as stated above, all other Review of Systems were negative.   Social History History  Substance Use Topics  . Smoking status: Former Smoker -- 0.50 packs/day for 45 years    Types: Cigarettes    Quit date: 11/03/2014  . Smokeless tobacco: Never Used     Comment: pt. started back smoking 10/18/2012, half a pack a  day  . Alcohol Use: No      Family History Family History  Problem Relation Age of Onset  . Colon cancer Mother   . Cancer Mother     colon  . Diabetes type II Father      Prior to Admission medications   Medication Sig Start Date End Date Taking? Authorizing Provider  acetaminophen (TYLENOL) 325 MG tablet Take 650 mg by mouth every 6 (six) hours as needed for mild pain or headache.    Yes Historical Provider, MD  alendronate (FOSAMAX) 10 MG tablet Take 10 mg by mouth daily.  03/20/14  Yes Historical Provider, MD  budesonide-formoterol (SYMBICORT) 160-4.5 MCG/ACT inhaler Inhale 2 puffs into the lungs 2 (two) times daily.   Yes Historical Provider, MD  cholecalciferol (VITAMIN D) 1000 UNITS tablet Take 1,000 Units by mouth every morning.    Yes Historical Provider, MD  [facility] ASPIRIN ADULT LOW DOSE 81 MG chewable tablet Chew 81 mg by mouth daily. 12/06/14  Yes Historical Provider, MD  cyanocobalamin 1000 MCG tablet Take 1,000 mcg by mouth every morning.    Yes  Historical Provider, MD  enoxaparin (LOVENOX) 120 MG/0.8ML injection Inject 0.8 mLs (120 mg total) into the skin daily. 12/06/14  Yes Thurnell Lose, MD  glimepiride (AMARYL) 2 MG tablet Take 2 mg by mouth daily with breakfast.  12/21/13  Yes Historical Provider, MD  guaiFENesin (MUCINEX) 600 MG 12 hr tablet Take 1,200 mg by mouth 2 (two) times daily.   Yes Historical Provider, MD  ipratropium (ATROVENT) 0.02 % nebulizer solution Take 2.5 mLs (0.5 mg total) by nebulization 4 (four) times daily. 03/05/14  Yes Brand Males, MD  levothyroxine (SYNTHROID, LEVOTHROID) 137 MCG tablet Take 137 mcg by mouth daily.  01/15/15  Yes Historical Provider, MD  lidocaine-prilocaine (EMLA) cream Apply 1 application topically as needed. Apply to port 1 hr before chemo 11/06/14  Yes Curt Bears, MD  LORazepam (ATIVAN) 1 MG tablet Take 1 mg by mouth 3 (three) times daily as needed for anxiety.    Yes Historical Provider, MD  magnesium gluconate (MAGONATE) 500 MG tablet Take 500 mg by mouth every morning.    Yes Historical Provider, MD  metFORMIN (GLUCOPHAGE) 1000 MG tablet Take 1,000 mg by mouth 2 (two) times daily with a meal.   Yes Historical Provider, MD  Multiple Vitamin (MULTIVITAMIN WITH MINERALS) TABS tablet Take 1 tablet by mouth every morning.   Yes Historical Provider, MD  OVER THE COUNTER MEDICATION Take 1 capsule by mouth daily. MSM. For energy and joint pain   Yes Historical Provider, MD  oxyCODONE (OXY IR/ROXICODONE) 5 MG immediate release tablet Take 1 tablet (5 mg total) by mouth every 4 (four) hours as needed for moderate pain. 01/09/15  Yes Adrena E Johnson, PA-C  PROAIR HFA 108 (90 BASE) MCG/ACT inhaler INHALE 2 PUFFS INTO THE LUNGS EVERY 6 (SIX) HOURS AS NEEDED FOR WHEEZING OR SHORTNESS OF BREATH. 10/08/14  Yes Brand Males, MD  prochlorperazine (COMPAZINE) 10 MG tablet Take 1 tablet (10 mg total) by mouth every 6 (six) hours as needed for nausea or vomiting. 01/09/15  Yes Adrena E Johnson, PA-C    sertraline (ZOLOFT) 100 MG tablet Take 150 mg by mouth at bedtime.    Yes Historical Provider, MD  spironolactone (ALDACTONE) 25 MG tablet Take 25 mg by mouth every morning.    Yes Historical Provider, MD  aspirin 81 MG tablet Take 1 tablet (81 mg total) by mouth daily with breakfast. Patient  not taking: Reported on 01/18/2015 12/06/14   Thurnell Lose, MD  docusate sodium 100 MG CAPS Take 100 mg by mouth 2 (two) times daily. Patient not taking: Reported on 01/09/2015 11/05/14   Reyne Dumas, MD  saccharomyces boulardii (FLORASTOR) 250 MG capsule Take 1 capsule (250 mg total) by mouth 2 (two) times daily. Patient not taking: Reported on 01/09/2015 12/06/14   Thurnell Lose, MD    Allergies  Allergen Reactions  . Carboplatin Rash    Skin test reaction  . Sulfonamide Derivatives Rash  . Codeine Other (See Comments)    hallucinations  . Dilaudid [Hydromorphone Hcl] Nausea And Vomiting and Other (See Comments)    Room started spinning.  Pt has been okay with low doses and nausea meds administered first  . Penicillins Other (See Comments)    Childhood allergy; reaction unknown  . Vicodin [Hydrocodone-Acetaminophen] Rash    Physical Exam  Vitals  Blood pressure 136/69, pulse 130, temperature 99.6 F (37.6 C), temperature source Oral, resp. rate 24, height 5\' 7"  (1.702 m), weight 77.565 kg (171 lb), SpO2 95 %.   1. General middle aged white female lying in bed in NAD,     2. Normal affect and insight, Not Suicidal or Homicidal, Awake Alert, Oriented X 3.  3. No F.N deficits, ALL C.Nerves Intact, Strength 5/5 all 4 extremities, Sensation intact all 4 extremities, Plantars down going.  4. Ears and Eyes appear Normal, Conjunctivae clear, PERRLA. Moist Oral Mucosa.  5. Supple Neck, No JVD, No cervical lymphadenopathy appriciated, No Carotid Bruits.  6. Symmetrical Chest wall movement, Good air movement bilaterally, Coarse b sounds, R Portacath  7. RRR, No Gallops, Rubs or Murmurs, No  Parasternal Heave.  8. Positive Bowel Sounds, Abdomen Soft, No tenderness, No organomegaly appriciated,No rebound -guarding or rigidity.  9.  No Cyanosis, Normal Skin Turgor, No Skin Rash or Bruise.  10. Good muscle tone,  joints appear normal , no effusions, Normal ROM.  11. No Palpable Lymph Nodes in Neck or Axillae     Data Review  CBC  Recent Labs Lab 01/16/15 1307 01/18/15 2115  WBC 2.3* 8.1  HGB 10.4* 10.0*  HCT 32.1* 30.9*  PLT 198 142*  MCV 87.0 88.8  MCH 28.2 28.7  MCHC 32.4 32.4  RDW 20.4* 20.8*  LYMPHSABS 0.6* 0.4*  MONOABS 0.5 0.0*  EOSABS 0.0 0.0  BASOSABS 0.1 0.0   ------------------------------------------------------------------------------------------------------------------  Chemistries   Recent Labs Lab 01/16/15 1307 01/18/15 2115  NA 140 132*  K 4.2 3.8  CL  --  99  CO2 22 20  GLUCOSE 215* 319*  BUN 7.1 9  CREATININE 0.7 0.78  CALCIUM 8.0* 8.3*  AST 31 36  ALT 33 29  ALKPHOS 67 59  BILITOT 0.28 0.5   ------------------------------------------------------------------------------------------------------------------ estimated creatinine clearance is 80.3 mL/min (by C-G formula based on Cr of 0.78). ------------------------------------------------------------------------------------------------------------------ No results for input(s): TSH, T4TOTAL, T3FREE, THYROIDAB in the last 72 hours.  Invalid input(s): FREET3   Coagulation profile No results for input(s): INR, PROTIME in the last 168 hours. ------------------------------------------------------------------------------------------------------------------- No results for input(s): DDIMER in the last 72 hours. -------------------------------------------------------------------------------------------------------------------  Cardiac Enzymes No results for input(s): CKMB, TROPONINI, MYOGLOBIN in the last 168 hours.  Invalid input(s):  CK ------------------------------------------------------------------------------------------------------------------ Invalid input(s): POCBNP   ---------------------------------------------------------------------------------------------------------------  Urinalysis    Component Value Date/Time   COLORURINE YELLOW 01/18/2015 2200   APPEARANCEUR CLEAR 01/18/2015 2200   LABSPEC 1.017 01/18/2015 2200   PHURINE 6.0 01/18/2015 2200   GLUCOSEU 500*  01/18/2015 2200   HGBUR NEGATIVE 01/18/2015 2200   BILIRUBINUR NEGATIVE 01/18/2015 2200   KETONESUR NEGATIVE 01/18/2015 2200   PROTEINUR NEGATIVE 01/18/2015 2200   UROBILINOGEN 0.2 01/18/2015 2200   NITRITE NEGATIVE 01/18/2015 2200   LEUKOCYTESUR NEGATIVE 01/18/2015 2200    ----------------------------------------------------------------------------------------------------------------  Imaging results:   Dg Chest 2 View  01/18/2015   CLINICAL DATA:  Fever and shortness breath for 1 day, chronic cough. History of diabetes, COPD and lung cancer.  EXAM: CHEST  2 VIEW  COMPARISON:  Chest radiograph December 05, 2013  FINDINGS: Cardiac silhouette is unremarkable. RIGHT upper lobe spiculated mass again seen with single-lumen RIGHT chest Port-A-Cath in place, distal tip at superior vena cava. Vascular stent in RIGHT lung. Mild chronic interstitial changes, similar without pleural effusion or focal consolidation, similar biapical pleural thickening. Flattened hemidiaphragms. No pneumothorax. Soft tissue planes and included osseous structures are nonsuspicious. Mild scoliosis.  IMPRESSION: Stable appearance of the chest, no acute cardiopulmonary process.  COPD, stable RIGHT upper lobe spiculated mass.   Electronically Signed   By: Elon Alas   On: 01/18/2015 22:49        Assessment & Plan  Principal Problem:   Sepsis Active Problems:   Primary cancer of right upper lobe of lung   Hypothyroidism   DM type 2 (diabetes mellitus, type 2)    COPD, moderate   Hemoptysis   Lung cancer    1. Sepsis due to clinical HCAP. Initial chest x-ray is not very impressive, however she has a clear history suggestive of pneumonia. Will be admitted to a telemetry bed, blood and sputum cultures will be obtained, HIV-legionella-streptococcal pneumoniae testing. IV antibiotics per HCAP protocol. IV fluid bolus and maintained in's. Initial lactic acid level was over 4, will repeat in the morning.   2. History of right upper lobe non-small cell cancer. Undergoing chemotherapy under Dr. [name], he has been added to treatment team.   3. DM type II. Check A1c, hold Glucophage, continue Amaryl, add sliding scale.   4. Baseline sinus tachycardia heart rate around 110 to 115. Currently in 130s. Due to sepsis. Hydrate, supportive care and monitor.   5. COPD. At baseline. No wheezing, no need first IV steroids at this time, supportive care with oxygen and nebulizer treatments as needed.   6. History of hypothyroidism. Check TSH. Not on medications.   7. Anxiety and depression. Home medications will be continued.   8. 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 Continue Lovenox to be dosed by pharmacy.      DVT Prophylaxis  Lovenox   AM Labs Ordered, also please review Full Orders  Family Communication: Admission, patients condition and plan of care including tests being ordered have been discussed with the patient who indicates understanding and agree with the plan and Code Status.  Code Status full  Likely DC to  Home  Condition GUARDED     Time spent in minutes : 35   Kemoni Ortega K M.D on 01/18/2015 at 11:23 PM  Between 7am to 7pm - Pager - [phone]  After 7pm go to [email or web address] - password [facility]  [facility]  Office  [phone]

## 2015-01-19 DIAGNOSIS — J189 Pneumonia, unspecified organism: Secondary | ICD-10-CM

## 2015-01-19 DIAGNOSIS — C3411 Malignant neoplasm of upper lobe, right bronchus or lung: Secondary | ICD-10-CM

## 2015-01-19 DIAGNOSIS — E119 Type 2 diabetes mellitus without complications: Secondary | ICD-10-CM

## 2015-01-19 DIAGNOSIS — E038 Other specified hypothyroidism: Secondary | ICD-10-CM

## 2015-01-19 LAB — LACTIC ACID, PLASMA
LACTIC ACID, VENOUS: 1.2 mmol/L (ref 0.5–2.0)
LACTIC ACID, VENOUS: 0.7 mmol/L (ref 0.5–2.0)
Lactic Acid, Venous: 0.9 mmol/L (ref 0.5–2.0)

## 2015-01-19 LAB — GLUCOSE, CAPILLARY
GLUCOSE-CAPILLARY: 171 mg/dL — AB (ref 70–99)
GLUCOSE-CAPILLARY: 221 mg/dL — AB (ref 70–99)
GLUCOSE-CAPILLARY: 189 mg/dL — AB (ref 70–99)
Glucose-Capillary: 111 mg/dL — ABNORMAL HIGH (ref 70–99)

## 2015-01-19 LAB — EXPECTORATED SPUTUM ASSESSMENT W GRAM STAIN, RFLX TO RESP C

## 2015-01-19 LAB — EXPECTORATED SPUTUM ASSESSMENT W REFEX TO RESP CULTURE

## 2015-01-19 LAB — PROCALCITONIN: Procalcitonin: 0.22 ng/mL

## 2015-01-19 LAB — TSH: TSH: 14.052 u[IU]/mL — ABNORMAL HIGH (ref 0.350–4.500)

## 2015-01-19 LAB — STREP PNEUMONIAE URINARY ANTIGEN: Strep Pneumo Urinary Antigen: NEGATIVE

## 2015-01-19 MED ORDER — VITAMIN B-12 1000 MCG PO TABS
1000.0000 ug | ORAL_TABLET | Freq: Every morning | ORAL | Status: DC
  Administered 2015-01-19 – 2015-01-24 (×6): 1000 ug via ORAL
  Filled 2015-01-19 (×6): qty 1

## 2015-01-19 MED ORDER — ADULT MULTIVITAMIN W/MINERALS CH
1.0000 | ORAL_TABLET | Freq: Every morning | ORAL | Status: DC
  Administered 2015-01-19 – 2015-01-24 (×6): 1 via ORAL
  Filled 2015-01-19 (×6): qty 1

## 2015-01-19 MED ORDER — PROCHLORPERAZINE MALEATE 10 MG PO TABS: 10.0000 mg | ORAL_TABLET | Freq: Four times a day (QID) | ORAL | Status: DC | PRN

## 2015-01-19 MED ORDER — LEVOTHYROXINE SODIUM 150 MCG PO TABS
150.0000 ug | ORAL_TABLET | Freq: Every day | ORAL | Status: DC
  Administered 2015-01-20 – 2015-01-24 (×5): 150 ug via ORAL
  Filled 2015-01-19 (×5): qty 1

## 2015-01-19 MED ORDER — GUAIFENESIN ER 600 MG PO TB12
1200.0000 mg | ORAL_TABLET | Freq: Two times a day (BID) | ORAL | Status: DC
  Administered 2015-01-19 – 2015-01-24 (×11): 1200 mg via ORAL
  Filled 2015-01-19 (×11): qty 2

## 2015-01-19 MED ORDER — SODIUM CHLORIDE 0.9 % IJ SOLN
10.0000 mL | INTRAMUSCULAR | Status: DC | PRN
  Administered 2015-01-19: 10 mL
  Administered 2015-01-19: 20 mL
  Administered 2015-01-20 – 2015-01-22 (×2): 10 mL
  Administered 2015-01-22: 20 mL
  Administered 2015-01-24: 10 mL
  Filled 2015-01-19 (×5): qty 40

## 2015-01-19 MED ORDER — ALBUTEROL SULFATE (2.5 MG/3ML) 0.083% IN NEBU
INHALATION_SOLUTION | RESPIRATORY_TRACT | Status: AC
  Filled 2015-01-19: qty 3

## 2015-01-19 MED ORDER — SACCHAROMYCES BOULARDII 250 MG PO CAPS
250.0000 mg | ORAL_CAPSULE | Freq: Two times a day (BID) | ORAL | Status: DC
  Administered 2015-01-19 – 2015-01-24 (×11): 250 mg via ORAL
  Filled 2015-01-19 (×11): qty 1

## 2015-01-19 MED ORDER — DOCUSATE SODIUM 100 MG PO CAPS
100.0000 mg | ORAL_CAPSULE | Freq: Two times a day (BID) | ORAL | Status: DC
  Administered 2015-01-19 – 2015-01-24 (×11): 100 mg via ORAL
  Filled 2015-01-19 (×11): qty 1

## 2015-01-19 MED ORDER — MAGNESIUM GLUCONATE 500 MG PO TABS
500.0000 mg | ORAL_TABLET | Freq: Every morning | ORAL | Status: DC
  Administered 2015-01-19 – 2015-01-24 (×6): 500 mg via ORAL
  Filled 2015-01-19 (×6): qty 1

## 2015-01-19 MED ORDER — BUDESONIDE-FORMOTEROL FUMARATE 160-4.5 MCG/ACT IN AERO
2.0000 | INHALATION_SPRAY | Freq: Two times a day (BID) | RESPIRATORY_TRACT | Status: DC
  Administered 2015-01-19: 2 via RESPIRATORY_TRACT
  Filled 2015-01-19: qty 6

## 2015-01-19 MED ORDER — ACETAMINOPHEN 325 MG PO TABS
650.0000 mg | ORAL_TABLET | Freq: Four times a day (QID) | ORAL | Status: DC | PRN
  Administered 2015-01-19 – 2015-01-22 (×4): 650 mg via ORAL
  Filled 2015-01-19 (×4): qty 2

## 2015-01-19 MED ORDER — LORAZEPAM 1 MG PO TABS
1.0000 mg | ORAL_TABLET | Freq: Three times a day (TID) | ORAL | Status: DC | PRN
  Administered 2015-01-19 – 2015-01-22 (×6): 1 mg via ORAL
  Filled 2015-01-19 (×6): qty 1

## 2015-01-19 MED ORDER — LIDOCAINE-PRILOCAINE 2.5-2.5 % EX CREA
1.0000 "application " | TOPICAL_CREAM | CUTANEOUS | Status: DC | PRN
  Filled 2015-01-19: qty 5

## 2015-01-19 MED ORDER — SODIUM CHLORIDE 0.9 % IV SOLN: INTRAVENOUS | Status: DC

## 2015-01-19 MED ORDER — SERTRALINE HCL 50 MG PO TABS
150.0000 mg | ORAL_TABLET | Freq: Every day | ORAL | Status: DC
  Administered 2015-01-19 – 2015-01-23 (×5): 150 mg via ORAL
  Filled 2015-01-19 (×7): qty 1

## 2015-01-19 MED ORDER — ALBUTEROL SULFATE (2.5 MG/3ML) 0.083% IN NEBU
2.5000 mg | INHALATION_SOLUTION | RESPIRATORY_TRACT | Status: DC | PRN
  Administered 2015-01-19 – 2015-01-21 (×2): 2.5 mg via RESPIRATORY_TRACT
  Filled 2015-01-19: qty 3

## 2015-01-19 MED ORDER — ENOXAPARIN SODIUM 120 MG/0.8ML ~~LOC~~ SOLN
120.0000 mg | Freq: Every day | SUBCUTANEOUS | Status: DC
  Administered 2015-01-19 – 2015-01-23 (×5): 120 mg via SUBCUTANEOUS
  Filled 2015-01-19 (×6): qty 0.8

## 2015-01-19 MED ORDER — OXYCODONE HCL 5 MG PO TABS
5.0000 mg | ORAL_TABLET | ORAL | Status: DC | PRN
  Administered 2015-01-19 – 2015-01-23 (×7): 5 mg via ORAL
  Filled 2015-01-19 (×7): qty 1

## 2015-01-19 MED ORDER — IPRATROPIUM BROMIDE 0.02 % IN SOLN
0.5000 mg | Freq: Four times a day (QID) | RESPIRATORY_TRACT | Status: DC
  Administered 2015-01-19 – 2015-01-20 (×8): 0.5 mg via RESPIRATORY_TRACT
  Filled 2015-01-19 (×8): qty 2.5

## 2015-01-19 MED ORDER — GLIMEPIRIDE 2 MG PO TABS
2.0000 mg | ORAL_TABLET | Freq: Every day | ORAL | Status: DC
  Administered 2015-01-19 – 2015-01-24 (×6): 2 mg via ORAL
  Filled 2015-01-19 (×6): qty 1

## 2015-01-19 MED ORDER — LEVOTHYROXINE SODIUM 137 MCG PO TABS
137.0000 ug | ORAL_TABLET | Freq: Every day | ORAL | Status: DC
  Administered 2015-01-19: 137 ug via ORAL
  Filled 2015-01-19: qty 1

## 2015-01-19 MED ORDER — GUAIFENESIN 100 MG/5ML PO SOLN
200.0000 mg | ORAL | Status: DC | PRN
  Administered 2015-01-19 – 2015-01-23 (×8): 200 mg via ORAL
  Filled 2015-01-19 (×6): qty 10
  Filled 2015-01-19: qty 50
  Filled 2015-01-19 (×3): qty 10

## 2015-01-19 MED ORDER — ASPIRIN 81 MG PO CHEW
81.0000 mg | CHEWABLE_TABLET | Freq: Every day | ORAL | Status: DC
  Administered 2015-01-19 – 2015-01-24 (×6): 81 mg via ORAL
  Filled 2015-01-19 (×6): qty 1

## 2015-01-19 MED ORDER — VITAMIN D3 25 MCG (1000 UNIT) PO TABS
1000.0000 [IU] | ORAL_TABLET | Freq: Every morning | ORAL | Status: DC
  Administered 2015-01-19 – 2015-01-24 (×6): 1000 [IU] via ORAL
  Filled 2015-01-19 (×6): qty 1

## 2015-01-19 NOTE — Plan of Care (Signed)
Problem: Consults Goal: Skin Care Protocol Initiated - if Braden Score 18 or less If consults are not indicated, leave blank or document N/A Outcome: Progressing Pt skin WNL. Has some bruising on hands from venipuncture. Otherwise skin is dry, clean, and intact.  Goal: Diabetes Guidelines if Diabetic/Glucose > 140 If diabetic or lab glucose is > 140 mg/dl - Initiate Diabetes/Hyperglycemia Guidelines & Document Interventions  Outcome: Progressing Sliding scale insulin. Orders for fingerstick in place.

## 2015-01-19 NOTE — Plan of Care (Signed)
Problem: Phase I Progression Outcomes Goal: Pain controlled with appropriate interventions Outcome: Progressing Does not have daily pain medication in place for prn medications.     Goal: OOB as tolerated unless otherwise ordered Pt has DOE, BSC at bedside. OOB x 1 assist.  Goal: Voiding-avoid urinary catheter unless indicated Pt voiding without difficulty.

## 2015-01-19 NOTE — Progress Notes (Signed)
Foley cath placed pt tolerated well.  800 cc immediate return of clear amber urine with slight odor.

## 2015-01-19 NOTE — Progress Notes (Signed)
ANTICOAGULATION CONSULT NOTE - Initial Consult  Pharmacy Consult for enoxaparin Indication: history of left upper arm DVT on Lovenox indefinitely  Allergies  Allergen Reactions  . Carboplatin Rash    Skin test reaction  . Sulfonamide Derivatives Rash  . Codeine Other (See Comments)    hallucinations  . Dilaudid [Hydromorphone Hcl] Nausea And Vomiting and Other (See Comments)    Room started spinning.  Pt has been okay with low doses and nausea meds administered first  . Penicillins Other (See Comments)    Childhood allergy; reaction unknown  . Vicodin [Hydrocodone-Acetaminophen] Rash    Patient Measurements: Height: 5\' 7"  (170.2 cm) Weight: 179 lb 0.2 oz (81.2 kg) IBW/kg (Calculated) : 61.6 Heparin Dosing Weight:   Vital Signs: Temp: 98.4 F (36.9 C) (04/03 0521) Temp Source: Oral (04/03 0521) BP: 113/53 mmHg (04/03 0521) Pulse Rate: 104 (04/03 0521)  Labs:  Recent Labs  01/16/15 1307 01/16/15 1307 01/18/15 2115  HGB 10.4*  --  10.0*  HCT 32.1*  --  30.9*  PLT 198  --  142*  CREATININE  --  0.7 0.78    Estimated Creatinine Clearance: 81.9 mL/min (by C-G formula based on Cr of 0.78).   Medical History: Past Medical History  Diagnosis Date  . Anxiety   . Hyperlipidemia   . Hypothyroidism   . COPD (chronic obstructive pulmonary disease)   . Arthritis     thumbs  . 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 Diabetes mellitus   . Lung cancer dx'd 05/2009  . Lung cancer dx'd 09/2013    recurrence in LN  . 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 sodium chloride   Intravenous Once  . sodium chloride   Intravenous Once  . aspirin  81 mg Oral Daily  . budesonide-formoterol  2 puff Inhalation BID  . ceFEPime (MAXIPIME) IV  2 g Intravenous 3 times per day  . cholecalciferol  1,000 Units Oral q morning - 10a  . docusate sodium  100 mg Oral BID  . enoxaparin  120 mg  Subcutaneous q1800  . glimepiride  2 mg Oral Q breakfast  . guaiFENesin  1,200 mg Oral BID  . insulin aspart  0-15 Units Subcutaneous TID WC  . insulin aspart  0-5 Units Subcutaneous QHS  . ipratropium  0.5 mg Nebulization QID  . levothyroxine  137 mcg Oral QAC breakfast  . magnesium gluconate  500 mg Oral q morning - 10a  . multivitamin with minerals  1 tablet Oral q morning - 10a  . saccharomyces boulardii  250 mg Oral BID  . sertraline  150 mg Oral QHS  . vancomycin  750 mg Intravenous 3 times per day  . cyanocobalamin  1,000 mcg Oral q morning - 10a    Assessment: Patient with history of left upper arm DVT on Lovenox indefinitely, history of intermittent hemoptysis.    Goal of Therapy:  Anti-Xa level 0.6-1 units/ml 4hrs after LMWH dose given Monitor platelets by anticoagulation protocol: Yes   Plan:  Continue with current/home dosing for lovenox  Nani Skillern Crowford 01/19/2015,5:24 AM

## 2015-01-19 NOTE — Progress Notes (Signed)
ANTIBIOTIC CONSULT NOTE - INITIAL  Pharmacy Consult for Vancomycin, cefepime  Indication: HCAP/sepsis  Allergies  Allergen Reactions  . Carboplatin Rash    Skin test reaction  . Sulfonamide Derivatives Rash  . Codeine Other (See Comments)    hallucinations  . Dilaudid [Hydromorphone Hcl] Nausea And Vomiting and Other (See Comments)    Room started spinning.  Pt has been okay with low doses and nausea meds administered first  . Penicillins Other (See Comments)    Childhood allergy; reaction unknown  . Vicodin [Hydrocodone-Acetaminophen] Rash    Patient Measurements: Height: 5\' 7"  (170.2 cm) Weight: 179 lb 0.2 oz (81.2 kg) IBW/kg (Calculated) : 61.6 Adjusted Body Weight:   Vital Signs: Temp: 99.7 F (37.6 C) (04/03 0033) Temp Source: Oral (04/03 0033) BP: 113/60 mmHg (04/03 0033) Pulse Rate: 126 (04/03 0033) Intake/Output from previous day: 04/02 0701 - 04/03 0700 In: 2000 [I.V.:2000] Out: 1250 [Urine:1250] Intake/Output from this shift: Total I/O In: 2000 [I.V.:2000] Out: 1250 [Urine:1250]  Labs:  Recent Labs  01/16/15 1307 01/16/15 1307 01/18/15 2115  WBC 2.3*  --  8.1  HGB 10.4*  --  10.0*  PLT 198  --  142*  CREATININE  --  0.7 0.78   Estimated Creatinine Clearance: 81.9 mL/min (by C-G formula based on Cr of 0.78). No results for input(s): VANCOTROUGH, VANCOPEAK, VANCORANDOM, GENTTROUGH, GENTPEAK, GENTRANDOM, TOBRATROUGH, TOBRAPEAK, TOBRARND, AMIKACINPEAK, AMIKACINTROU, AMIKACIN in the last 72 hours.   Microbiology: Recent Results (from the past 720 hour(s))  TECHNOLOGIST REVIEW     Status: None   Collection Time: 01/02/15  1:15 PM  Result Value Ref Range Status   Technologist Review   Corrected    Metas and Myelocytes present, large platelets,3% NRBCs    Comment: Result amended from (Metas and Myelocytes present, large platelets) (01/02/2015 2:04 PM) to (Metas and Myelocytes present, large platelets,3% NRBCs).  Culture, sputum-assessment     Status:  None   Collection Time: 01/19/15 12:54 AM  Result Value Ref Range Status   Specimen Description SPUTUM  Final   Special Requests NONE  Final   Sputum evaluation   Final    THIS SPECIMEN IS ACCEPTABLE. RESPIRATORY CULTURE REPORT TO FOLLOW.   Report Status 01/19/2015 FINAL  Final    Medical History: Past Medical History  Diagnosis Date  . Anxiety   . Hyperlipidemia   . Hypothyroidism   . COPD (chronic obstructive pulmonary disease)   . Arthritis     thumbs  . 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 Diabetes mellitus   . Lung cancer dx'd 05/2009  . Lung cancer dx'd 09/2013    recurrence in LN  . 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     Medications:  Anti-infectives    Start     Dose/Rate Route Frequency Ordered Stop   01/18/15 2345  ceFEPIme (MAXIPIME) 2 g in dextrose 5 % 50 mL IVPB     2 g 100 mL/hr over 30 Minutes Intravenous 3 times per day 01/18/15 2332 01/26/15 2159   01/18/15 2330  vancomycin (VANCOCIN) IVPB 750 mg/150 ml premix     750 mg 150 mL/hr over 60 Minutes Intravenous 3 times per day 01/18/15 2324     01/18/15 2315  ceFEPIme (MAXIPIME) 1 g in dextrose 5 % 50 mL IVPB  Status:  Discontinued     1 g 100 mL/hr over 30 Minutes Intravenous 3 times per day 01/18/15 2309 01/18/15 2332  Assessment: Sepsis due to clinical HCAP. Initial chest x-ray is not very impressive, however she has a clear history suggestive of pneumonia.    Goal of Therapy:  Vancomycin trough level 15-20 mcg/ml  Cefepime dosed based on patient weight and renal function   Plan:  Measure antibiotic drug levels at steady state Follow up culture results vancomycin 750mg  iv q8hr  Cefepime 2gm iv q8hr  Tyler Deis, Shea Stakes Crowford 01/19/2015,5:23 AM

## 2015-01-19 NOTE — Progress Notes (Signed)
Patient ID: Janice Ball, female   DOB: 1956-01-28, 59 y.o.   MRN: [id number] [facility] PROGRESS NOTE  Janice Ball JEH:[id number] DOB: Apr 08, 1956 DOA: 01/18/2015 PCP: [name], NP   Brief narrative:    59 year old female with past medical history of recurrent non small cell lung cancer, stage 3a (under Dr. Worthy Flank care), diagnosed in 05/2009, undergoing current chemotherapy with gemcitabine (carboplatin stopped because of reaction to [place] skin test dose), left upper extremity DVT (on anticoagulation with Lovenox), diabetes, hypothyroidism, depression, grade 1 diastolic dysfunction (Last 2 D ECHO in 10/2014 with preserved EF showing grade 1 DD), COPD. She has last chemotherapy 01/09/2015 (Gemcitabine). Patient presented to [facility] ED with worsening shortness of breath, productive cough, fever for past 1 day prior to this admission. In ED, BP was 113/60, HR 104-134, RR 22-26, T max 103.2 F and oxygen saturation 95% on [place] oxygen support. Blood work showed hemoglobin of 10, platelets of 142. CXR showed stable appearance of the chest, stable right upper lung lobe spiculated mass. She was started on empiric vanco and cefepime for treatment of pneumonia.   Assessment/Plan:    Principal Problem:   Sepsis secondary to possible HCAP, lobar pneumonia / COPD exacerbation  - Sepsis criteria met on admission with fever, tachycardia, tachypnea, lactic acid of 4.66,  evidence of infection clinically since pt with cough and shortness of breath.  - Strep pneumonia is negative, legionella and respiratory culture is pending. Blood cultures not obtained at the time of the admission. I have placed order set for sepsis this am. Follow up procalcitonin level and blood culture results. - Continue vanco and cefepime until the blood work results are back - Continue supportive care with IV fluids  - Continue duoneb every 6 hours scheduled and albuterol every 2 hours as needed for shortness of breath or wheezing    Active Problems:   Primary cancer of right upper lobe of lung - Last chemotherapy with gemcitabine 01/09/2015  - Will notify Dr. [name] of patient's admission in am    Hypothyroidism - TSH 14 on this admission. Will increase levothyroxine to 150 mcg daily instead of 137 mcg daily.    DM type 2 (diabetes mellitus, type 2), controlled - Continue amaryl and SSI for now. Pt also on metformin but since she is in sepsis will hold of resuming metformin.       Left upper extremity DVT - on AC with Lovenox    Anemia of chronic disease / thrombocytopenia  - Secondary to history of malignancy and sequela of chemotherapy - Hgb stable, platelet count 142 - No current indications for transfusion    DVT prophylaxis - on AC with Lovenox    Code Status: Full.  Family Communication:  plan of care discussed with the patient Disposition Plan: Getting IV abx for possible HCAP, not yet stable for discharge.   IV access:  Peripheral IV PAC  Procedures and diagnostic studies:    Dg Chest 2 View 01/18/2015  Stable appearance of the chest, no acute cardiopulmonary process.  COPD, stable RIGHT upper lobe spiculated mass.     US Venous Img Upper Uni Left 01/07/2015  1. No residual or recurrent left upper extremity DVT.      Medical Consultants:  None   Other Consultants:  None   IAnti-Infectives:   Vanco 01/18/2015 --> Cefepime 01/18/2015 -->     Faye Ramsay, MD  [facility] Pager [phone]  If 7PM-7AM, please contact night-coverage [email or web address] Password [email or web address] 01/19/2015, 2:40 PM  LOS: 1 day   HPI/Subjective: No events overnight.   Objective: Filed Vitals:   01/19/15 0521 01/19/15 0755 01/19/15 1245 01/19/15 1425  BP: 113/53   118/63  Pulse: 104   103  Temp: 98.4 F (36.9 C)   98.5 F (36.9 C)  TempSrc: Oral   Oral  Resp: 24   20  Height:      Weight:      SpO2: 98% 99% 98% 98%    Intake/Output Summary (Last 24 hours) at 01/19/15 1440 Last data filed at 01/19/15 1423   Gross per 24 hour  Intake   2480 ml  Output   2750 ml  Net   -270 ml    Exam:   General:  Pt is not in acute distress  Cardiovascular: Regular rate and rhythm, S1/S2 appreciated   Respiratory: diminished bilaterally, no wheezing   Abdomen: non tender, non distended, appreciate bowel sounds   Extremities: No edema, pulses palpable bilaterally  Neuro: Grossly nonfocal  Data Reviewed: Basic Metabolic Panel:  Recent Labs Lab 01/16/15 1307 01/18/15 2115  NA 140 132*  K 4.2 3.8  CL  --  99  CO2 22 20  GLUCOSE 215* 319*  BUN 7.1 9  CREATININE 0.7 0.78  CALCIUM 8.0* 8.3*   Liver Function Tests:  Recent Labs Lab 01/16/15 1307 01/18/15 2115  AST 31 36  ALT 33 29  ALKPHOS 67 59  BILITOT 0.28 0.5  PROT 6.6 6.8  ALBUMIN 3.3* 3.6   No results for input(s): LIPASE, AMYLASE in the last 168 hours. No results for input(s): AMMONIA in the last 168 hours. CBC:  Recent Labs Lab 01/16/15 1307 01/18/15 2115  WBC 2.3* 8.1  NEUTROABS 1.2* 7.7  HGB 10.4* 10.0*  HCT 32.1* 30.9*  MCV 87.0 88.8  PLT 198 142*   Cardiac Enzymes: No results for input(s): CKTOTAL, CKMB, CKMBINDEX, TROPONINI in the last 168 hours. BNP: Invalid input(s): POCBNP CBG:  Recent Labs Lab 01/18/15 2321 01/19/15 0747 01/19/15 1139  GLUCAP 197* 171* 221*    Recent Results (from the past 240 hour(s))  Culture, sputum-assessment     Status: None   Collection Time: 01/19/15 12:54 AM  Result Value Ref Range Status   Specimen Description SPUTUM  Final   Special Requests NONE  Final   Sputum evaluation   Final    THIS SPECIMEN IS ACCEPTABLE. RESPIRATORY CULTURE REPORT TO FOLLOW.   Report Status 01/19/2015 FINAL  Final     Scheduled Meds: . aspirin  81 mg Oral Daily  . budesonide-formoterol  2 puff Inhalation BID  . ceFEPime (MAXIPIME)   2 g Intravenous 3 times per day  . cholecalciferol  1,000 Units Oral q morning - 10a  . docusate sodium  100 mg Oral BID  . enoxaparin  120 mg  Subcutaneous q1800  . glimepiride  2 mg Oral Q breakfast  . guaiFENesin  1,200 mg Oral BID  . insulin aspart  0-15 Units Subcutaneous TID WC  . insulin aspart  0-5 Units Subcutaneous QHS  . ipratropium  0.5 mg Nebulization QID  . levothyroxine  137 mcg Oral QAC breakfast  . magnesium gluconate  500 mg Oral q morning - 10a  . multivitamin with  1 tablet Oral q morning - 10a  . saccharomyces bou  250 mg Oral BID  . sertraline  150 mg Oral QHS  . vancomycin  750 mg Intravenous 3 times per day  . cyanocobalamin  1,000 mcg Oral q morning - 10a  Continuous Infusions: . sodium chloride 100 mL/hr at 01/19/15 1128

## 2015-01-20 LAB — VANCOMYCIN, TROUGH: Vancomycin Tr: 11.7 ug/mL (ref 10.0–20.0)

## 2015-01-20 LAB — URINE CULTURE: Colony Count: 3000

## 2015-01-20 LAB — HEMOGLOBIN A1C
HEMOGLOBIN A1C: 7.4 % — AB (ref 4.8–5.6)
MEAN PLASMA GLUCOSE: 166 mg/dL

## 2015-01-20 LAB — GLUCOSE, CAPILLARY
GLUCOSE-CAPILLARY: 186 mg/dL — AB (ref 70–99)
GLUCOSE-CAPILLARY: 177 mg/dL — AB (ref 70–99)
GLUCOSE-CAPILLARY: 159 mg/dL — AB (ref 70–99)

## 2015-01-20 LAB — LEGIONELLA ANTIGEN, URINE

## 2015-01-20 MED ORDER — VANCOMYCIN HCL IN DEXTROSE 1-5 GM/200ML-% IV SOLN
1000.0000 mg | Freq: Three times a day (TID) | INTRAVENOUS | Status: DC
  Administered 2015-01-21 (×3): 1000 mg via INTRAVENOUS
  Filled 2015-01-20 (×4): qty 200

## 2015-01-20 MED ORDER — IPRATROPIUM-ALBUTEROL 0.5-2.5 (3) MG/3ML IN SOLN
3.0000 mL | Freq: Four times a day (QID) | RESPIRATORY_TRACT | Status: DC
  Administered 2015-01-21 – 2015-01-22 (×4): 3 mL via RESPIRATORY_TRACT
  Filled 2015-01-20 (×4): qty 3

## 2015-01-20 MED ORDER — SODIUM CHLORIDE 0.9 % IV SOLN
INTRAVENOUS | Status: DC
  Administered 2015-01-20 (×2): via INTRAVENOUS

## 2015-01-20 MED ORDER — DEXTROSE 5 % IV SOLN
1.0000 g | Freq: Three times a day (TID) | INTRAVENOUS | Status: DC
  Administered 2015-01-20 – 2015-01-21 (×3): 1 g via INTRAVENOUS
  Filled 2015-01-20 (×4): qty 1

## 2015-01-20 NOTE — Progress Notes (Signed)
ANTIBIOTIC CONSULT NOTE - Follow-up  Pharmacy Consult for Vancomycin, cefepime  Indication: HCAP/sepsis  Allergies  Allergen Reactions  . Carboplatin Rash    Skin test reaction  . Sulfonamide Derivatives Rash  . Codeine Other (See Comments)    hallucinations  . Dilaudid [Hydromorphone Hcl] Nausea And Vomiting and Other (See Comments)    Room started spinning.  Pt has been okay with low doses and nausea meds administered first  . Penicillins Other (See Comments)    Childhood allergy; reaction unknown  . Vicodin [Hydrocodone-Acetaminophen] Rash    Patient Measurements: Height: 5\' 7"  (170.2 cm) Weight: 179 lb 0.2 oz (81.2 kg) IBW/kg (Calculated) : 61.6 Adjusted Body Weight:   Vital Signs: Temp: 97.5 F (36.4 C) (04/04 1429) Temp Source: Oral (04/04 1429) BP: 123/54 mmHg (04/04 1429) Pulse Rate: 108 (04/04 1429) Intake/Output from previous day: 04/03 0701 - 04/04 0700 In: 3118.3 [P.O.:1680; I.V.:1038.3; IV Piggyback:400] Out: 3701 [Urine:3700; Stool:1] Intake/Output from this shift: Total I/O In: 480 [P.O.:480] Out: -   Labs:  Recent Labs  01/18/15 2115  WBC 8.1  HGB 10.0*  PLT 142*  CREATININE 0.78   Estimated Creatinine Clearance: 81.9 mL/min (by C-G formula based on Cr of 0.78).  Recent Labs  01/20/15 1400  Nitro 11.7     Microbiology: Recent Results (from the past 720 hour(s))  TECHNOLOGIST REVIEW     Status: None   Collection Time: 01/02/15  1:15 PM  Result Value Ref Range Status   Technologist Review   Corrected    Metas and Myelocytes present, large platelets,3% NRBCs    Comment: Result amended from (Metas and Myelocytes present, large platelets) (01/02/2015 2:04 PM) to (Metas and Myelocytes present, large platelets,3% NRBCs).  Culture, blood (routine x 2)     Status: None (Preliminary result)   Collection Time: 01/18/15  9:40 PM  Result Value Ref Range Status   Specimen Description BLOOD RIGHT PORT  Final   Special Requests BOTTLES DRAWN  AEROBIC AND ANAEROBIC 4CC  Final   Culture   Final           BLOOD CULTURE RECEIVED NO GROWTH TO DATE CULTURE WILL BE HELD FOR 5 DAYS BEFORE ISSUING A FINAL NEGATIVE REPORT Performed at [facility]    Report Status PENDING  Incomplete  Culture, blood (routine x 2)     Status: None (Preliminary result)   Collection Time: 01/18/15  9:47 PM  Result Value Ref Range Status   Specimen Description BLOOD BLOOD LEFT FOREARM  Final   Special Requests BOTTLES DRAWN AEROBIC AND ANAEROBIC 5ML  Final   Culture   Final           BLOOD CULTURE RECEIVED NO GROWTH TO DATE CULTURE WILL BE HELD FOR 5 DAYS BEFORE ISSUING A FINAL NEGATIVE REPORT Performed at [facility]    Report Status PENDING  Incomplete  Urine culture     Status: None   Collection Time: 01/18/15 10:00 PM  Result Value Ref Range Status   Specimen Description URINE, CLEAN CATCH  Final   Special Requests NONE  Final   Colony Count   Final    3,000 COLONIES/ML Performed at [facility]    Culture   Final    INSIGNIFICANT GROWTH Performed at [facility]    Report Status 01/20/2015 FINAL  Final  Culture, sputum-assessment     Status: None   Collection Time: 01/19/15 12:54 AM  Result Value Ref Range Status   Specimen Description SPUTUM  Final   Special Requests NONE  Final   Sputum evaluation   Final    THIS SPECIMEN IS ACCEPTABLE. RESPIRATORY CULTURE REPORT TO FOLLOW.   Report Status 01/19/2015 FINAL  Final  Culture, respiratory (NON-Expectorated)     Status: None (Preliminary result)   Collection Time: 01/19/15 12:54 AM  Result Value Ref Range Status   Specimen Description SPUTUM  Final   Special Requests NONE  Final   Gram Stain   Final    NO WBC SEEN NO SQUAMOUS EPITHELIAL CELLS SEEN FEW GRAM POSITIVE RODS FEW GRAM NEGATIVE RODS FEW GRAM POSITIVE COCCI IN PAIRS    Culture   Final    Culture reincubated for better growth Performed at [facility]    Report Status PENDING   Incomplete  Culture, blood (x 2)     Status: None (Preliminary result)   Collection Time: 01/19/15  3:40 PM  Result Value Ref Range Status   Specimen Description BLOOD LEFT ARM  Final   Special Requests   Final    BOTTLES DRAWN AEROBIC AND ANAEROBIC 10CC BLUE BOTTLE, [place] RED BOTTLE   Culture   Final           BLOOD CULTURE RECEIVED NO GROWTH TO DATE CULTURE WILL BE HELD FOR 5 DAYS BEFORE ISSUING A FINAL NEGATIVE REPORT Performed at [facility]    Report Status PENDING  Incomplete    Medical History: Past Medical History  Diagnosis Date  . Anxiety   . Hyperlipidemia   . Hypothyroidism   . COPD (chronic obstructive pulmonary disease)   . Arthritis     thumbs  . 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 Diabetes mellitus   . Lung cancer dx'd 05/2009  . Lung cancer dx'd 09/2013    recurrence in LN  . 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     Medications:  Anti-infectives    Start     Dose/Rate Route Frequency Ordered Stop   01/18/15 2345  ceFEPIme (MAXIPIME) 2 g in dextrose 5 % 50 mL IVPB     2 g 100 mL/hr over 30 Minutes Intravenous 3 times per day 01/18/15 2332 01/26/15 2159   01/18/15 2330  vancomycin (VANCOCIN) IVPB 750 mg/150 ml premix     750 mg 150 mL/hr over 60 Minutes Intravenous 3 times per day 01/18/15 2324     01/18/15 2315  ceFEPIme (MAXIPIME) 1 g in dextrose 5 % 50 mL IVPB  Status:  Discontinued     1 g 100 mL/hr over 30 Minutes Intravenous 3 times per day 01/18/15 2309 01/18/15 2332     Assessment: HPI: 56 YOF w/ sepsis due to clinical HCAP adn AECOPD. Initial chest x-ray is not very impressive, however she has a clear history suggestive of pneumonia  01/18/2015 >> vanc >> 01/18/2015 >> cefepime >>  Tmax: 100.1 WBCs: 2.3-->8.1 Renal: Scr 0.78, CrCl 82  4/2 bloodx2: NGTD 4/2 urine: 3K CFU/ml - insignificant growth 4/3 bloodx2: NGTD 4/3 respiratory: pending 4/3 Strep Ag: negative 4/3  Legionella Ag: negative 4/4 HIV: pending  Dose changes/drug level info:  4/4 1330 VT =  11.7 mcg/ml on 750mg  q8h (prior to 6th dose)  Goal of Therapy:  Vancomycin trough level 15-20 mcg/ml  Cefepime dosed based on patient weight and renal function   Plan:  Day #2 vancomycin/cefepime  Vancomycin trough subtherapeutic, change to 1gm IV q8h  Need to check trough shortly after new steady  d/t concern for accumulation based on age.    Not neutropenic, change cefepime to 1gm IV q8h   Doreene Eland, PharmD, BCPS.   Pager: [phone]  01/20/2015,3:25 PM

## 2015-01-20 NOTE — Progress Notes (Addendum)
Patient ID: Janice Ball, female   DOB: April 11, 1956, 59 y.o.   MRN: [id number] [facility] PROGRESS NOTE  FOLASADE MOOTY [id number]:[id number] DOB: [birth year] DOA: 01/18/2015 PCP: [name], NP   Brief narrative:    59 year old female with past medical history of recurrent non small cell lung cancer, stage 3a (under Dr. Worthy Flank care), diagnosed in 05/2009, undergoing current chemotherapy with gemcitabine (carboplatin stopped because of reaction to [place] skin test dose), left upper extremity DVT (on anticoagulation with Lovenox), diabetes, hypothyroidism, depression, grade 1 diastolic dysfunction (Last 2 D ECHO in 10/2014 with preserved EF showing grade 1 DD), COPD. She has last chemotherapy 01/09/2015 (Gemcitabine).  Patient presented to [facility] ED with worsening shortness of breath, productive cough, fever for past 1 day prior to this admission. In ED, BP was 113/60, HR 104-134, RR 22-26, T max 103.2 F and oxygen saturation 95% on [place] oxygen support. Blood work showed hemoglobin of 10, platelets of 142. CXR showed stable appearance of the chest, stable right upper lung lobe spiculated mass. She was started on empiric vanco and cefepime for treatment of pneumonia.  Assessment/Plan:    Principal Problem:   Sepsis secondary to possible HCAP, lobar pneumonia / COPD exacerbation  - Sepsis criteria met on admission with fever, tachycardia, tachypnea, lactic acid of 4.66,  evidence of infection clinically since pt with cough and shortness of breath.  - Strep pneumonia is negative, legionella and respiratory culture is pending. - blood cultures pending  - Continue vanco and cefepime until the blood work results are back, day #3 - Continue duoneb every 6 hours scheduled and albuterol every 2 hours as needed for shortness of breath or wheezing   Active Problems:   Primary cancer of right upper lobe of lung - Last chemotherapy with gemcitabine 01/09/2015  - appreciate Dr. [name] help      Hypothyroidism - TSH 14 on this admission. Will increase levothyroxine to 150 mcg daily instead of 137 mcg daily.    DM type 2 (diabetes mellitus, type 2), controlled - Continue amaryl and SSI for now.  - metformin on hold for now       Left upper extremity DVT - on AC with Lovenox    Anemia of chronic disease / thrombocytopenia  - Secondary to history of malignancy and sequela of chemotherapy - Hgb stable, platelet count 142 - No current indications for transfusion     Non severe PCM - i the context of acute illness - nutritionist consulted    DVT prophylaxis - on AC with Lovenox   Code Status: Full.  Family Communication:  plan of care discussed with the patient Disposition Plan: Getting IV abx for possible HCAP, not yet stable for discharge.   IV access:  Peripheral IV PAC  Procedures and diagnostic studies:    Dg Chest 2 View 01/18/2015  Stable appearance of the chest, no acute cardiopulmonary process.  COPD, stable RIGHT upper lobe spiculated mass.     US Venous Img Upper Uni Left 01/07/2015   No residual or recurrent left upper extremity DVT.     Medical Consultants:  None   Other Consultants:  None   IAnti-Infectives:   Vanco 01/18/2015 --> Cefepime 01/18/2015 -->   Faye Ramsay, MD  [facility] Pager [phone]  If 7PM-7AM, please contact night-coverage [email or web address] Password [email or web address] 01/20/2015, 3:00 PM   LOS: 2 days   HPI/Subjective: No events overnight.   Objective: Filed Vitals:   01/20/15 9470 01/20/15 9628 01/20/15 1143 01/20/15 1429  BP: 116/58   123/54  Pulse: 110   108  Temp: 97.8 F (36.6 C)   97.5 F (36.4 C)  TempSrc: Oral   Oral  Resp: 20   22  Height:      Weight:      SpO2: 97% 97% 96% 98%    Intake/Output Summary (Last 24 hours) at 01/20/15 1500 Last data filed at 01/20/15 1300  Gross per 24 hour  Intake 3118.33 ml  Output   2201 ml  Net 917.33 ml    Exam:   General:  Pt is not in acute distress  Cardiovascular: Regular  rate and rhythm, S1/S2 appreciated   Respiratory: diminished bilaterally, no wheezing, scattered rhonchi bilaterally   Abdomen: non tender, non distended, appreciate bowel sounds   Extremities: No edema, pulses palpable bilaterally  Neuro: Grossly nonfocal  Data Reviewed: Basic Metabolic Panel:  Recent Labs Lab 01/16/15 1307 01/18/15 2115  NA 140 132*  K 4.2 3.8  CL  --  99  CO2 22 20  GLUCOSE 215* 319*  BUN 7.1 9  CREATININE 0.7 0.78  CALCIUM 8.0* 8.3*   Liver Function Tests:  Recent Labs Lab 01/16/15 1307 01/18/15 2115  AST 31 36  ALT 33 29  ALKPHOS 67 59  BILITOT 0.28 0.5  PROT 6.6 6.8  ALBUMIN 3.3* 3.6   CBC:  Recent Labs Lab 01/16/15 1307 01/18/15 2115  WBC 2.3* 8.1  NEUTROABS 1.2* 7.7  HGB 10.4* 10.0*  HCT 32.1* 30.9*  MCV 87.0 88.8  PLT 198 142*   CBG:  Recent Labs Lab 01/19/15 1139 01/19/15 1701 01/19/15 2115 01/20/15 0753 01/20/15 1204  GLUCAP 221* 111* 189* 186* 177*    Recent Results (from the past 240 hour(s))  Culture, blood (routine x 2)     Status: None (Preliminary result)   Collection Time: 01/18/15  9:40 PM  Result Value Ref Range Status   Specimen Description BLOOD RIGHT PORT  Final   Special Requests BOTTLES DRAWN AEROBIC AND ANAEROBIC 4CC  Final   Culture   Final           BLOOD CULTURE RECEIVED NO GROWTH TO DATE CULTURE WILL BE HELD FOR 5 DAYS BEFORE ISSUING A FINAL NEGATIVE REPORT Performed at [facility]    Report Status PENDING  Incomplete  Culture, blood (routine x 2)     Status: None (Preliminary result)   Collection Time: 01/18/15  9:47 PM  Result Value Ref Range Status   Specimen Description BLOOD BLOOD LEFT FOREARM  Final   Special Requests BOTTLES DRAWN AEROBIC AND ANAEROBIC 5ML  Final   Culture   Final           BLOOD CULTURE RECEIVED NO GROWTH TO DATE CULTURE WILL BE HELD FOR 5 DAYS BEFORE ISSUING A FINAL NEGATIVE REPORT Performed at [facility]    Report Status PENDING  Incomplete   Urine culture     Status: None   Collection Time: 01/18/15 10:00 PM  Result Value Ref Range Status   Specimen Description URINE, CLEAN CATCH  Final   Special Requests NONE  Final   Colony Count   Final    3,000 COLONIES/ML Performed at [facility]    Culture   Final    INSIGNIFICANT GROWTH Performed at [facility]    Report Status 01/20/2015 FINAL  Final  Culture, sputum-assessment     Status: None   Collection Time: 01/19/15 12:54 AM  Result Value Ref Range Status  Specimen Description SPUTUM  Final   Special Requests NONE  Final   Sputum evaluation   Final    THIS SPECIMEN IS ACCEPTABLE. RESPIRATORY CULTURE REPORT TO FOLLOW.   Report Status 01/19/2015 FINAL  Final  Culture, respiratory (NON-Expectorated)     Status: None (Preliminary result)   Collection Time: 01/19/15 12:54 AM  Result Value Ref Range Status   Specimen Description SPUTUM  Final   Special Requests NONE  Final   Gram Stain   Final    NO WBC SEEN NO SQUAMOUS EPITHELIAL CELLS SEEN FEW GRAM POSITIVE RODS FEW GRAM NEGATIVE RODS FEW GRAM POSITIVE COCCI IN PAIRS    Culture   Final    Culture reincubated for better growth Performed at [facility]    Report Status PENDING  Incomplete  Culture, blood (x 2)     Status: None (Preliminary result)   Collection Time: 01/19/15  3:40 PM  Result Value Ref Range Status   Specimen Description BLOOD LEFT ARM  Final   Special Requests   Final    BOTTLES DRAWN AEROBIC AND ANAEROBIC 10CC BLUE BOTTLE, [place] RED BOTTLE   Culture   Final           BLOOD CULTURE RECEIVED NO GROWTH TO DATE CULTURE WILL BE HELD FOR 5 DAYS BEFORE ISSUING A FINAL NEGATIVE REPORT Performed at [facility]    Report Status PENDING  Incomplete     Scheduled Meds: . aspirin  81 mg Oral Daily  . budesonide-formoterol  2 puff Inhalation BID  . ceFEPime (MAXIPIME)   2 g Intravenous 3 times per day  . cholecalciferol  1,000 Units Oral q morning - 10a  . docusate  sodium  100 mg Oral BID  . enoxaparin  120 mg Subcutaneous q1800  . glimepiride  2 mg Oral Q breakfast  . guaiFENesin  1,200 mg Oral BID  . insulin aspart  0-15 Units Subcutaneous TID WC  . insulin aspart  0-5 Units Subcutaneous QHS  . ipratropium  0.5 mg Nebulization QID  . levothyroxine  137 mcg Oral QAC breakfast  . saccharomyces bou  250 mg Oral BID  . sertraline  150 mg Oral QHS  . vancomycin  750 mg Intravenous 3 times per day  . cyanocobalamin  1,000 mcg Oral q morning - 10a   Continuous Infusions: . sodium chloride 100 mL/hr at 01/20/15 1317

## 2015-01-21 LAB — CULTURE, RESPIRATORY W GRAM STAIN
Culture: NORMAL
Gram Stain: NONE SEEN

## 2015-01-21 LAB — CBC
HCT: 25 % — ABNORMAL LOW (ref 36.0–46.0)
Hemoglobin: 8 g/dL — ABNORMAL LOW (ref 12.0–15.0)
MCH: 28.4 pg (ref 26.0–34.0)
MCHC: 32 g/dL (ref 30.0–36.0)
MCV: 88.7 fL (ref 78.0–100.0)
Platelets: 105 10*3/uL — ABNORMAL LOW (ref 150–400)
RBC: 2.82 MIL/uL — ABNORMAL LOW (ref 3.87–5.11)
RDW: 20.2 % — ABNORMAL HIGH (ref 11.5–15.5)
WBC: 5.4 10*3/uL (ref 4.0–10.5)

## 2015-01-21 LAB — BASIC METABOLIC PANEL
ANION GAP: 7 (ref 5–15)
BUN: 7 mg/dL (ref 6–23)
CALCIUM: 7.6 mg/dL — AB (ref 8.4–10.5)
CO2: 26 mmol/L (ref 19–32)
Chloride: 102 mmol/L (ref 96–112)
Creatinine, Ser: 0.54 mg/dL (ref 0.50–1.10)
Glucose, Bld: 177 mg/dL — ABNORMAL HIGH (ref 70–99)
Potassium: 3.4 mmol/L — ABNORMAL LOW (ref 3.5–5.1)
SODIUM: 135 mmol/L (ref 135–145)

## 2015-01-21 LAB — GLUCOSE, CAPILLARY
GLUCOSE-CAPILLARY: 176 mg/dL — AB (ref 70–99)
GLUCOSE-CAPILLARY: 101 mg/dL — AB (ref 70–99)
Glucose-Capillary: 189 mg/dL — ABNORMAL HIGH (ref 70–99)
Glucose-Capillary: 228 mg/dL — ABNORMAL HIGH (ref 70–99)
Glucose-Capillary: 222 mg/dL — ABNORMAL HIGH (ref 70–99)

## 2015-01-21 LAB — HIV ANTIBODY (ROUTINE TESTING W REFLEX): HIV Screen 4th Generation wRfx: NONREACTIVE

## 2015-01-21 MED ORDER — LEVOFLOXACIN 750 MG PO TABS
750.0000 mg | ORAL_TABLET | Freq: Every evening | ORAL | Status: DC
  Administered 2015-01-21 – 2015-01-23 (×3): 750 mg via ORAL
  Filled 2015-01-21 (×4): qty 1

## 2015-01-21 MED ORDER — POTASSIUM CHLORIDE CRYS ER 20 MEQ PO TBCR
40.0000 meq | EXTENDED_RELEASE_TABLET | Freq: Once | ORAL | Status: AC
  Administered 2015-01-21: 40 meq via ORAL
  Filled 2015-01-21: qty 2

## 2015-01-21 NOTE — Progress Notes (Signed)
Nutrition Brief Note  Patient identified on the Malnutrition Screening Tool (MST) Report  Per pt she has lost about 10 lb since December 2015 which would be 5% weight loss in 5 months.  She feels that her appetite is mostly really good. 24 hr recall seemed adequate. She does report that she may skip a meal but this is not routine. Pt declines any intervention at this time. We did discuss use of nutrition supplements as needed at home when and if her appetite declines.   Wt Readings from Last 15 Encounters:  01/19/15 179 lb 0.2 oz (81.2 kg)  01/09/15 171 lb 1.6 oz (77.61 kg)  12/19/14 171 lb 11.2 oz (77.883 kg)  12/11/14 171 lb 6.4 oz (77.747 kg)  12/06/14 173 lb 1.6 oz (78.518 kg)  11/28/14 175 lb 8 oz (79.606 kg)  11/06/14 178 lb (80.74 kg)  10/15/14 181 lb 1.6 oz (82.146 kg)  10/14/14 182 lb 9.6 oz (82.827 kg)  10/10/14 184 lb 12.8 oz (83.825 kg)  10/02/14 184 lb (83.462 kg)  09/05/14 188 lb (85.276 kg)  09/04/14 184 lb 11.2 oz (83.779 kg)  08/14/14 181 lb 12.8 oz (82.464 kg)  07/16/14 184 lb (83.462 kg)    Body mass index is 28.03 kg/(m^2). Patient meets criteria for overweight based on current BMI.   Current diet order is Regular, patient is consuming approximately 75-100% of meals at this time. Labs and medications reviewed.   No nutrition interventions warranted at this time. If nutrition issues arise, please consult RD.   [place], [place], [place] Pager [phone] After Hours Pager

## 2015-01-21 NOTE — Progress Notes (Signed)
Patient ID: Janice Ball, female   DOB: July 16, 1956, 59 y.o.   MRN: [id number] [facility] PROGRESS NOTE  HOLLIS OH [id number] DOB: Oct 09, 1956 DOA: 01/18/2015 PCP: [name], NP   Brief narrative:    59 year old female with past medical history of recurrent non small cell lung cancer, stage 3a (under Dr. Worthy Flank care), diagnosed in 05/2009, undergoing current chemotherapy with gemcitabine (carboplatin stopped because of reaction to [place] skin test dose), left upper extremity DVT (on anticoagulation with Lovenox), diabetes, hypothyroidism, depression, grade 1 diastolic dysfunction (Last 2 D ECHO in 10/2014 with preserved EF showing grade 1 DD), COPD. She has last chemotherapy 01/09/2015 (Gemcitabine).  Patient presented to [facility] ED with worsening shortness of breath, productive cough, fever for past 1 day prior to this admission. In ED, BP was 113/60, HR 104-134, RR 22-26, T max 103.2 F and oxygen saturation 95% on [place] oxygen support. Blood work showed hemoglobin of 10, platelets of 142. CXR showed stable appearance of the chest, stable right upper lung lobe spiculated mass. She was started on empiric vanco and cefepime for treatment of pneumonia.  Assessment/Plan:    Principal Problem:   Sepsis secondary to ? HCAP, lobar pneumonia / COPD exacerbation  - Sepsis criteria met on admission with fever, tachycardia, tachypnea, lactic acid of 4.66,  evidence of infection clinically - pt still weak but reports feeling better overall  - Strep pneumonia is negative, legionella and respiratory culture is pending. - blood cultures no growth to date but final report still pending  - Continue vanco and cefepime day #4, will transition to oral Levaquin today 4/5 - Continue duoneb every 6 hours scheduled and albuterol every 2 hours as needed for shortness of breath or wheezing   Active Problems:   Primary cancer of right upper lobe of lung - Last chemotherapy with gemcitabine 01/09/2015  -  appreciate Dr. Worthy Flank help     Hypothyroidism - TSH 14 on this admission. - increase levothyroxine to 150 mcg daily instead of 137 mcg daily.    DM type 2 (diabetes mellitus, type 2), controlled - Continue amaryl and SSI for now.  - metformin on hold for now but can be resumed upon discharge       Left upper extremity DVT - on [facility] with Lovenox    Hypokalemia - supplement and repeat BMP in AM    Anemia of chronic disease / thrombocytopenia  - Secondary to history of malignancy and sequela of chemotherapy - Hg drop since admission form 10 --> 8 but no evidence of active bleeding - Plt also dropped from 142 --> 105 K this AM - continue to monitor closely and repeat CBC in AM - transfuse PRBC if Hg < 8    Non severe PCM - in the context of acute illness - nutritionist consulted, pt meets criteria for overweight  - advance diet as pt able to tolerate     DVT prophylaxis - on [facility] with Lovenox   Code Status: Full.  Family Communication:  plan of care discussed with the patient Disposition Plan: not yet stable for discharge, Hg and Plt drop overnight, repeat CBC in AM and possible d/c in 48 hours depending on clinical course   IV access:  Peripheral IV PAC  Procedures and diagnostic studies:    Dg Chest 2 View 01/18/2015  Stable appearance of the chest, no acute cardiopulmonary process.  COPD, stable RIGHT upper lobe spiculated mass.     US Venous Img Upper Uni Left  01/07/2015   No residual or recurrent left upper extremity DVT.     Medical Consultants:  None   Other Consultants:  None   IAnti-Infectives:   Vanco 01/18/2015 --> 01/21/2015 Cefepime 01/18/2015 --> 01/21/2015   Faye Ramsay, MD  [facility] Pager [phone]  If 7PM-7AM, please contact night-coverage [email or web address] Password [email or web address] 01/21/2015, 5:26 PM   LOS: 3 days   HPI/Subjective: No events overnight.   Objective: Filed Vitals:   01/21/15 0507 01/21/15 0803 01/21/15 1440 01/21/15 1532  BP: 121/63  125/63    Pulse: 105  103   Temp: 98.2 F (36.8 C)  97.9 F (36.6 C)   TempSrc: Oral  Oral   Resp: 20  20   Height:      Weight:      SpO2: 95% 96% 98% 99%    Intake/Output Summary (Last 24 hours) at 01/21/15 1726 Last data filed at 01/21/15 1441  Gross per 24 hour  Intake   1590 ml  Output   3351 ml  Net  -1761 ml    Exam:   General:  Pt is not in acute distress, appears weak and tired this AM  Cardiovascular: Regular rate and rhythm, S1/S2 appreciated   Respiratory: diminished bilaterally, no wheezing, scattered rhonchi bilaterally   Abdomen: non tender, non distended, appreciate bowel sounds   Extremities: No edema, pulses palpable bilaterally  Neuro: Grossly nonfocal  Data Reviewed: Basic Metabolic Panel:  Recent Labs Lab 01/16/15 1307 01/18/15 2115 01/21/15 0335  NA 140 132* 135  K 4.2 3.8 3.4*  CL  --  99 102  CO2 22 20 26   GLUCOSE 215* 319* 177*  BUN 7.1 9 7   CREATININE 0.7 0.78 0.54  CALCIUM 8.0* 8.3* 7.6*   Liver Function Tests:  Recent Labs Lab 01/16/15 1307 01/18/15 2115  AST 31 36  ALT 33 29  ALKPHOS 67 59  BILITOT 0.28 0.5  PROT 6.6 6.8  ALBUMIN 3.3* 3.6   CBC:  Recent Labs Lab 01/16/15 1307 01/18/15 2115 01/21/15 0335  WBC 2.3* 8.1 5.4  NEUTROABS 1.2* 7.7  --   HGB 10.4* 10.0* 8.0*  HCT 32.1* 30.9* 25.0*  MCV 87.0 88.8 88.7  PLT 198 142* 105*   CBG:  Recent Labs Lab 01/20/15 0753 01/20/15 1204 01/20/15 1635 01/20/15 2121 01/21/15 0802  GLUCAP 186* 177* 159* 176* 189*    Recent Results (from the past 240 hour(s))  Culture, blood (routine x 2)     Status: None (Preliminary result)   Collection Time: 01/18/15  9:40 PM  Result Value Ref Range Status   Specimen Description BLOOD RIGHT PORT  Final   Special Requests BOTTLES DRAWN AEROBIC AND ANAEROBIC 4CC  Final   Culture   Final           BLOOD CULTURE RECEIVED NO GROWTH TO DATE CULTURE WILL BE HELD FOR 5 DAYS BEFORE ISSUING A FINAL NEGATIVE REPORT Performed at [facility]    Report Status PENDING  Incomplete  Culture, blood (routine x 2)     Status: None (Preliminary result)   Collection Time: 01/18/15  9:47 PM  Result Value Ref Range Status   Specimen Description BLOOD BLOOD LEFT FOREARM  Final   Special Requests BOTTLES DRAWN AEROBIC AND ANAEROBIC 5ML  Final   Culture   Final           BLOOD CULTURE RECEIVED NO GROWTH TO DATE CULTURE WILL BE HELD FOR 5 DAYS BEFORE ISSUING A FINAL NEGATIVE REPORT Performed at  [facility]    Report Status PENDING  Incomplete  Urine culture     Status: None   Collection Time: 01/18/15 10:00 PM  Result Value Ref Range Status   Specimen Description URINE, CLEAN CATCH  Final   Special Requests NONE  Final   Colony Count   Final    3,000 COLONIES/ML Performed at [facility]    Culture   Final    INSIGNIFICANT GROWTH Performed at [facility]    Report Status 01/20/2015 FINAL  Final  Culture, sputum-assessment     Status: None   Collection Time: 01/19/15 12:54 AM  Result Value Ref Range Status   Specimen Description SPUTUM  Final   Special Requests NONE  Final   Sputum evaluation   Final    THIS SPECIMEN IS ACCEPTABLE. RESPIRATORY CULTURE REPORT TO FOLLOW.   Report Status 01/19/2015 FINAL  Final  Culture, respiratory (NON-Expectorated)     Status: None   Collection Time: 01/19/15 12:54 AM  Result Value Ref Range Status   Specimen Description SPUTUM  Final   Special Requests NONE  Final   Gram Stain   Final    NO WBC SEEN NO SQUAMOUS EPITHELIAL CELLS SEEN FEW GRAM POSITIVE RODS FEW GRAM NEGATIVE RODS FEW GRAM POSITIVE COCCI IN PAIRS    Culture   Final    NORMAL OROPHARYNGEAL FLORA Performed at [facility]    Report Status 01/21/2015 FINAL  Final  Culture, blood (x 2)     Status: None (Preliminary result)   Collection Time: 01/19/15  3:40 PM  Result Value Ref Range Status   Specimen Description BLOOD LEFT ARM  Final   Special Requests   Final    BOTTLES  DRAWN AEROBIC AND ANAEROBIC 10CC BLUE BOTTLE, [place] RED BOTTLE   Culture   Final           BLOOD CULTURE RECEIVED NO GROWTH TO DATE CULTURE WILL BE HELD FOR 5 DAYS BEFORE ISSUING A FINAL NEGATIVE REPORT Performed at [facility]    Report Status PENDING  Incomplete     Scheduled Meds: . aspirin  81 mg Oral Daily  . budesonide-formoterol  2 puff Inhalation BID  . ceFEPime (MAXIPIME)   2 g Intravenous 3 times per day  . cholecalciferol  1,000 Units Oral q morning - 10a  . docusate sodium  100 mg Oral BID  . enoxaparin  120 mg Subcutaneous q1800  . glimepiride  2 mg Oral Q breakfast  . guaiFENesin  1,200 mg Oral BID  . insulin aspart  0-15 Units Subcutaneous TID WC  . insulin aspart  0-5 Units Subcutaneous QHS  . ipratropium  0.5 mg Nebulization QID  . levothyroxine  137 mcg Oral QAC breakfast  . saccharomyces bou  250 mg Oral BID  . sertraline  150 mg Oral QHS  . vancomycin  750 mg Intravenous 3 times per day  . cyanocobalamin  1,000 mcg Oral q morning - 10a   Continuous Infusions:

## 2015-01-22 LAB — GLUCOSE, CAPILLARY
Glucose-Capillary: 161 mg/dL — ABNORMAL HIGH (ref 70–99)
Glucose-Capillary: 216 mg/dL — ABNORMAL HIGH (ref 70–99)
Glucose-Capillary: 330 mg/dL — ABNORMAL HIGH (ref 70–99)
Glucose-Capillary: 288 mg/dL — ABNORMAL HIGH (ref 70–99)

## 2015-01-22 LAB — CBC
HCT: 25.8 % — ABNORMAL LOW (ref 36.0–46.0)
HEMOGLOBIN: 8.3 g/dL — AB (ref 12.0–15.0)
MCH: 28.6 pg (ref 26.0–34.0)
MCHC: 32.2 g/dL (ref 30.0–36.0)
MCV: 89 fL (ref 78.0–100.0)
Platelets: 91 10*3/uL — ABNORMAL LOW (ref 150–400)
RBC: 2.9 MIL/uL — ABNORMAL LOW (ref 3.87–5.11)
RDW: 20 % — ABNORMAL HIGH (ref 11.5–15.5)
WBC: 2.5 10*3/uL — AB (ref 4.0–10.5)

## 2015-01-22 MED ORDER — IPRATROPIUM-ALBUTEROL 0.5-2.5 (3) MG/3ML IN SOLN
3.0000 mL | RESPIRATORY_TRACT | Status: DC
  Administered 2015-01-22 – 2015-01-23 (×6): 3 mL via RESPIRATORY_TRACT
  Filled 2015-01-22 (×6): qty 3

## 2015-01-22 MED ORDER — BUDESONIDE-FORMOTEROL FUMARATE 160-4.5 MCG/ACT IN AERO
2.0000 | INHALATION_SPRAY | Freq: Two times a day (BID) | RESPIRATORY_TRACT | Status: DC
  Administered 2015-01-22 – 2015-01-24 (×5): 2 via RESPIRATORY_TRACT
  Filled 2015-01-22: qty 6

## 2015-01-22 MED ORDER — METHYLPREDNISOLONE SODIUM SUCC 125 MG IJ SOLR
60.0000 mg | Freq: Two times a day (BID) | INTRAMUSCULAR | Status: DC
Start: 2015-01-22 — End: 2015-01-23
  Administered 2015-01-22 (×2): 60 mg via INTRAVENOUS
  Filled 2015-01-22 (×3): qty 0.96

## 2015-01-22 NOTE — Progress Notes (Signed)
Inpatient Diabetes Program Recommendations  AACE/ADA: New Consensus Statement on Inpatient Glycemic Control (2013)  Target Ranges:  Prepandial:   less than 140 mg/dL      Peak postprandial:   less than 180 mg/dL (1-2 hours)      Critically ill patients:  140 - 180 mg/dL     Results for CAROLYNE, WHITSEL (MRN [id number]) as of 01/22/2015 09:34  Ref. Range 01/21/2015 08:02 01/21/2015 11:57 01/21/2015 16:51 01/21/2015 21:02  Glucose-Capillary Latest Range: 70-99 mg/dL 189 (H) 228 (H) 101 (H) 222 (H)    Admit with: Sepsis/ Pneumonia  History: DM, COPD, Lung cancer  Home DM Meds: Amaryl 2 mg daily      Metformin 1000 mg bid  Current DM Orders: Amaryl 2 mg daily            Novolog Moderate SSI tid ac + HS    **Note patient to start on IV Solumedrol 60 mg Q12 hours today.  **Expect glucose levels to rise with addition of IV steroids.  **Patient eating 70-100% of meals.    MD- Please consider the following:  1. Add Novolog Meal Coverage- Novolog 3 units tid with meals   2. Change diet to Carbohydrate Modified diet (currently ordered as Regular diet with no carbohydrate restrictions)     Will follow Wyn Quaker RN, MSN, CDE Diabetes Coordinator Inpatient Diabetes Program Team Pager: [phone] (8a-5p)

## 2015-01-22 NOTE — Plan of Care (Signed)
Problem: Acute Rehab PT Goals(only PT should resolve) Goal: Pt Will Ambulate Level surfaces

## 2015-01-22 NOTE — Progress Notes (Signed)
Physical Therapy Evaluation Patient Details Name: Janice Ball MRN: [id number] DOB: Jan 30, 1956 Today's Date: 01/22/2015   History of Present Illness  59 yo female with onset of HCAP and current lung CA patient, history of COPD, anxiety, HLD, DM.    Clinical Impression  Pt was seen and noted her weakness which was a decline from PLOF.  Pt has developed a low endurance, O2 sats are fluctuating and her ability to move is less independent and unsafer.  She has agreed to SNF for strengthenign and endurance to avoid increased fall risk and poor success with being able to handle even her self care.    Follow Up Recommendations SNF;Supervision/Assistance - 24 hour    Equipment Recommendations  Rolling walker with 5" wheels    Recommendations for Other Services       Precautions / Restrictions Precautions Precautions: Fall Restrictions Weight Bearing Restrictions: No      Mobility  Bed Mobility Overal bed mobility: Needs Assistance Bed Mobility: Supine to Sit     Supine to sit: Min assist;Min guard     General bed mobility comments: using handrails as needed for support (HOB elevated)  Transfers Overall transfer level: Needs assistance Equipment used: Rolling walker (2 wheeled);1 person hand held assist Transfers: Sit to/from [facility] Sit to Stand: Min assist Stand pivot transfers: Min assist       General transfer comment: reminders as pt tends to use hands on walker to stand otherwise  Ambulation/Gait Ambulation/Gait assistance: Min guard;Min assist Ambulation Distance (Feet): 20 Feet Assistive device: Rolling walker (2 wheeled);1 person hand held assist Gait Pattern/deviations: Step-to pattern;Decreased stride length;Narrow base of support Gait velocity: reduced Gait velocity interpretation: Below normal speed for age/gender General Gait Details: Tends to stiff legged pattern, low energy and PT maneuvering her IV pole.  O2 sats were stable with  gait for 10 x 2 feet, with short sitting rest in the middle  Stairs            Wheelchair Mobility    Modified Rankin (Stroke Patients Only)       Balance Overall balance assessment: Needs assistance Sitting-balance support: Feet supported Sitting balance-Leahy Scale: Good   Postural control: Posterior lean Standing balance support: Bilateral upper extremity supported Standing balance-Leahy Scale: Fair Standing balance comment: fair- dynamic balance                             Pertinent Vitals/Pain Pain Assessment: No/denies pain    Home Living Family/patient expects to be discharged to:: Private residence Living Arrangements: Children Available Help at Discharge: Available 24 hours/day;Family Type of Home: House Home Access: Level entry     Home Layout: One level Home Equipment: Cane - single point;Shower seat      Prior Function Level of Independence: Independent with assistive device(s)         Comments: Family assistance with house work     Journalist, newspaper        Extremity/Trunk Assessment   Upper Extremity Assessment: Overall WFL for tasks assessed           Lower Extremity Assessment: Generalized weakness      Cervical / Trunk Assessment: Normal  Communication   Communication: No difficulties  Cognition Arousal/Alertness: Lethargic Behavior During Therapy: WFL for tasks assessed/performed Overall Cognitive Status: Within Functional Limits for tasks assessed  General Comments General comments (skin integrity, edema, etc.): Pt is having some difficulty with endurance but O2 sats were stable for gait, resulting in 98% initially then 95-96% atter gait     Exercises        Assessment/Plan    PT Assessment Patient needs continued PT services  PT Diagnosis Generalized weakness   PT Problem List Decreased strength;Decreased range of motion;Decreased activity tolerance;Decreased  balance;Decreased mobility;Decreased knowledge of use of DME;Cardiopulmonary status limiting activity  PT Treatment Interventions DME instruction;Gait training;Functional mobility training;Therapeutic activities;Therapeutic exercise;Balance training;Neuromuscular re-education;Patient/family education   PT Goals (Current goals can be found in the Care Plan section) Acute Rehab PT Goals Patient Stated Goal: to get home PT Goal Formulation: With patient Time For Goal Achievement: 02/05/15 Potential to Achieve Goals: Good    Frequency Min 3X/week   Barriers to discharge Other (comment) (Generally higher fall risk with low endurance, need SNF)      Co-evaluation               End of Session Equipment Utilized During Treatment: Gait belt;Oxygen Activity Tolerance: Patient limited by fatigue;Patient limited by lethargy Patient left: in chair;with call bell/phone within reach Nurse Communication: Mobility status         Time: 1043-1110 PT Time Calculation (min) (ACUTE ONLY): 27 min   Charges:   PT Evaluation $Initial PT Evaluation Tier I: 1 Procedure PT Treatments $Gait Training: 8-22 mins   PT G CodesRamond Dial 2015/02/10, 12:44 PM   Mee Hives, PT MS Acute Rehab Dept. Number: [phone]

## 2015-01-22 NOTE — Progress Notes (Addendum)
ANTICOAGULATION CONSULT NOTE - Follow-up  Pharmacy Consult for enoxaparin Indication: history of left upper arm DVT on Lovenox indefinitely  Allergies  Allergen Reactions  . Carboplatin Rash    Skin test reaction  . Sulfonamide Derivatives Rash  . Codeine Other (See Comments)    hallucinations  . Dilaudid [Hydromorphone Hcl] Nausea And Vomiting and Other (See Comments)    Room started spinning.  Pt has been okay with low doses and nausea meds administered first  . Penicillins Other (See Comments)    Childhood allergy; reaction unknown  . Vicodin [Hydrocodone-Acetaminophen] Rash    Patient Measurements: Height: 5\' 7"  (170.2 cm) Weight: 179 lb 0.2 oz (81.2 kg) IBW/kg (Calculated) : 61.6 Heparin Dosing Weight:   Vital Signs: Temp: 98.2 F (36.8 C) (04/06 0445) Temp Source: Oral (04/06 0445) BP: 134/74 mmHg (04/06 0445) Pulse Rate: 98 (04/06 0811)  Labs:  Recent Labs  01/21/15 0335 01/22/15 0435  HGB 8.0* 8.3*  HCT 25.0* 25.8*  PLT 105* 91*  CREATININE 0.54  --     Estimated Creatinine Clearance: 81.9 mL/min (by C-G formula based on Cr of 0.54).   Medications:  Scheduled:  . aspirin  81 mg Oral Daily  . budesonide-formoterol  2 puff Inhalation BID  . cholecalciferol  1,000 Units Oral q morning - 10a  . docusate sodium  100 mg Oral BID  . enoxaparin  120 mg Subcutaneous q1800  . glimepiride  2 mg Oral Q breakfast  . guaiFENesin  1,200 mg Oral BID  . insulin aspart  0-15 Units Subcutaneous TID WC  . insulin aspart  0-5 Units Subcutaneous QHS  . ipratropium-albuterol  3 mL Nebulization Q4H  . levofloxacin  750 mg Oral QPM  . levothyroxine  150 mcg Oral QAC breakfast  . magnesium gluconate  500 mg Oral q morning - 10a  . methylPREDNISolone (SOLU-MEDROL) injection  60 mg Intravenous Q12H  . multivitamin with minerals  1 tablet Oral q morning - 10a  . saccharomyces boulardii  250 mg Oral BID  . sertraline  150 mg Oral QHS  . cyanocobalamin  1,000 mcg Oral q  morning - 10a    Assessment: 58 YOF history of left upper arm DVT on Lovenox indefinitely, history of intermittent hemoptysis.    Today, 01/22/2015:  CBC: Hgb low/stable, pltc 91 - trending down (ptlc = 198 at admission, and 262 on 3/24). S/p gemcitabine 3/31  HIT panel ordered 4/6 by [facility]  Renal: SCr WNL on 4/5 with CrCl > 4ml/min  No bleeding noted  Goal of Therapy:  Anti-Xa level 0.6-1 units/ml 4hrs after LMWH dose given Monitor platelets by anticoagulation protocol: Yes   Plan:   Continue with enoxaparin 120mg  SQ 24h as per home dosing   Ensure CBC checked at least q72h while in hospital  HIT ordered by [facility] and to call hematology to discuss LMWH therapy  I Suspect chemotherapy playing role in causing thrombocytopenia  Doreene Eland, PharmD, BCPS.   Pager: [phone]  01/22/2015,10:40 AM

## 2015-01-22 NOTE — Progress Notes (Signed)
[facility]                                                                              Patient Demographics  Janice Ball, is a 59 y.o. female, DOB - 13-Feb-1956, PHX:[id number]  Admit date - 01/18/2015   Admitting Physician Thurnell Lose, MD  Outpatient Primary MD for the patient is [name], NP  LOS - 4   Chief Complaint  Patient presents with  . Fever       Brief HPI   59 year old female with past medical history of recurrent non small cell lung cancer, stage 3a (under Dr. Worthy Flank care), diagnosed in 05/2009, undergoing current chemotherapy with gemcitabine (carboplatin stopped because of reaction to [place] skin test dose), left upper extremity DVT (on anticoagulation with Lovenox), diabetes, hypothyroidism, depression, grade 1 diastolic dysfunction (Last 2 D ECHO in 10/2014 with preserved EF showing grade 1 DD), COPD. She has last chemotherapy 01/09/2015 (Gemcitabine). Patient presented to [facility] ED with worsening shortness of breath, productive cough, fever for past 1 day prior to this admission. In ED, BP was 113/60, HR 104-134, RR 22-26, T max 103.2 F and oxygen saturation 95% on [place] oxygen support. Blood work showed hemoglobin of 10, platelets of 142. CXR showed stable appearance of the chest, stable right upper lung lobe spiculated mass. She was started on empiric vanco and cefepime for treatment of pneumonia.   Assessment & Plan   Principal Problem:  Sepsis secondary to HCAP, lobar pneumonia / COPD exacerbation;- Sepsis criteria met on admission with fever, tachycardia, tachypnea, lactic acid of 4.66, evidence of infection clinically - Still feels tight and wheezing throughout - Influenza panel negative, HIV negative, urine strep antigen negative, urine legionella antigen negative, blood cultures negative so far.  - Changed to Levaquin yesterday, day #2 today, completed 4 days of IV vancomycin and cefepime - Changed DuoNeb's to q4hours, placed on  Symbicort, placed on IV Solu-Medrol, flutter valve  Active Problems:  Primary cancer of right upper lobe of lung - Last chemotherapy with gemcitabine 01/09/2015  - appreciate Dr. Worthy Flank help    Hypothyroidism - TSH 14 on this admission. - increased levothyroxine to 150 mcg daily instead of 137 mcg daily.   DM type 2 (diabetes mellitus, type 2), controlled - Continue amaryl and SSI for now.  - metformin on hold for now but can be resumed upon discharge     Left upper extremity DVT - on [facility] with Lovenox, however platelets decreasing down to 91,000 today  Thrombocytopenia: Platelets count decreasing, possibly due to #1, chemotherapy - Patient is a however on Lovenox therapy for DVT, check hit panel and follow platelet count closely   Anemia of chronic disease / thrombocytopenia - Secondary to history of malignancy and sequela of chemotherapy -  hemoglobin currently stable   Non severe Protein calorie malnutrition - in the context of acute illness, nutrition consult placed   Code Status: Full code  family Communication: Discussed in detail with the patient, all imaging results, lab results explained to the patient    Disposition Plan; Hopefully in 24-48 hours, PT rec SNF  Time Spent in minutes  37mnutes  Procedures  none  Consults   none  DVT Prophylaxis  Lovenox   Medications  Scheduled Meds: . aspirin  81 mg Oral Daily  . budesonide-formoterol  2 puff Inhalation BID  . cholecalciferol  1,000 Units Oral q morning - 10a  . docusate sodium  100 mg Oral BID  . enoxaparin  120 mg Subcutaneous q1800  . glimepiride  2 mg Oral Q breakfast  . guaiFENesin  1,200 mg Oral BID  . insulin aspart  0-15 Units Subcutaneous TID WC  . insulin aspart  0-5 Units Subcutaneous QHS  . ipratropium-albuterol  3 mL Nebulization Q4H  . levofloxacin  750 mg Oral QPM  . levothyroxine  150 mcg Oral QAC breakfast  . magnesium gluconate  500 mg Oral q morning - 10a  .  methylPREDNISolone (SOLU-MEDROL) injection  60 mg Intravenous Q12H  . multivitamin with minerals  1 tablet Oral q morning - 10a  . saccharomyces boulardii  250 mg Oral BID  . sertraline  150 mg Oral QHS  . cyanocobalamin  1,000 mcg Oral q morning - 10a   Continuous Infusions:  PRN Meds:.acetaminophen, albuterol, guaiFENesin, lidocaine-prilocaine, LORazepam, oxyCODONE, prochlorperazine, sodium chloride   Antibiotics   Anti-infectives    Start     Dose/Rate Route Frequency Ordered Stop   01/21/15 1830  levofloxacin (LEVAQUIN) tablet 750 mg     750 mg Oral Every evening 01/21/15 1746     01/20/15 2200  vancomycin (VANCOCIN) IVPB 1000 mg/200 mL premix  Status:  Discontinued     1,000 mg 200 mL/hr over 60 Minutes Intravenous 3 times per day 01/20/15 1531 01/21/15 1746   01/20/15 2200  ceFEPIme (MAXIPIME) 1 g in dextrose 5 % 50 mL IVPB  Status:  Discontinued     1 g 100 mL/hr over 30 Minutes Intravenous 3 times per day 01/20/15 1531 01/21/15 1746   01/18/15 2345  ceFEPIme (MAXIPIME) 2 g in dextrose 5 % 50 mL IVPB  Status:  Discontinued     2 g 100 mL/hr over 30 Minutes Intravenous 3 times per day 01/18/15 2332 01/20/15 1531   01/18/15 2330  vancomycin (VANCOCIN) IVPB 750 mg/150 ml premix  Status:  Discontinued     750 mg 150 mL/hr over 60 Minutes Intravenous 3 times per day 01/18/15 2324 01/20/15 1531   01/18/15 2315  ceFEPIme (MAXIPIME) 1 g in dextrose 5 % 50 mL IVPB  Status:  Discontinued     1 g 100 mL/hr over 30 Minutes Intravenous 3 times per day 01/18/15 2309 01/18/15 2332        Subjective:   BDebbera Wolkenwas seen and examined today.   not feeling too well today, complaining of shortness of breath and wheezing. No nausea, vomiting, diarrhea, abdominal pain fevers or chills. No acute events overnight.    Objective:   Blood pressure 134/74, pulse 98, temperature 98.2 F (36.8 C), temperature source Oral, resp. rate 22, height 5' 7"  (1.702 m), weight 81.2 kg (179 lb 0.2  oz), SpO2 97 %.  Wt Readings from Last 3 Encounters:  01/19/15 81.2 kg (179 lb 0.2 oz)  01/09/15 77.61 kg (171 lb 1.6 oz)  12/19/14 77.883 kg (171 lb 11.2 oz)     Intake/Output Summary (Last 24 hours) at 01/22/15 1353 Last data filed at 01/22/15 0445  Gross per 24 hour  Intake      0 ml  Output   2301 ml  Net  -2301 ml    Exam  General:  Alert and oriented x 3, NAD  HEENT:  PERRLA, EOMI, Anicteic Sclera, mucous membranes moist.   Neck: Supple, no JVD, no masses  CVS: S1 S2 auscultated, no rubs, murmurs or gallops. Regular rate and rhythm.  Respiratory: diffuse expiratory wheezing bilaterally  Abdomen: Soft, nontender, nondistended, + bowel sounds  Ext: no cyanosis clubbing or edema  Neuro: AAOx3, Cr N's II- XII. Strength 5/5 upper and lower extremities bilaterally  Skin: No rashes  Psych: Normal affect and demeanor, alert and oriented x3    Data Review   Micro Results Recent Results (from the past 240 hour(s))  Culture, blood (routine x 2)     Status: None (Preliminary result)   Collection Time: 01/18/15  9:40 PM  Result Value Ref Range Status   Specimen Description BLOOD RIGHT PORT  Final   Special Requests BOTTLES DRAWN AEROBIC AND ANAEROBIC 4CC  Final   Culture   Final           BLOOD CULTURE RECEIVED NO GROWTH TO DATE CULTURE WILL BE HELD FOR 5 DAYS BEFORE ISSUING A FINAL NEGATIVE REPORT Performed at [facility]    Report Status PENDING  Incomplete  Culture, blood (routine x 2)     Status: None (Preliminary result)   Collection Time: 01/18/15  9:47 PM  Result Value Ref Range Status   Specimen Description BLOOD BLOOD LEFT FOREARM  Final   Special Requests BOTTLES DRAWN AEROBIC AND ANAEROBIC 5ML  Final   Culture   Final           BLOOD CULTURE RECEIVED NO GROWTH TO DATE CULTURE WILL BE HELD FOR 5 DAYS BEFORE ISSUING A FINAL NEGATIVE REPORT Performed at [facility]    Report Status PENDING  Incomplete  Urine culture     Status: None    Collection Time: 01/18/15 10:00 PM  Result Value Ref Range Status   Specimen Description URINE, CLEAN CATCH  Final   Special Requests NONE  Final   Colony Count   Final    3,000 COLONIES/ML Performed at [facility]    Culture   Final    INSIGNIFICANT GROWTH Performed at [facility]    Report Status 01/20/2015 FINAL  Final  Culture, sputum-assessment     Status: None   Collection Time: 01/19/15 12:54 AM  Result Value Ref Range Status   Specimen Description SPUTUM  Final   Special Requests NONE  Final   Sputum evaluation   Final    THIS SPECIMEN IS ACCEPTABLE. RESPIRATORY CULTURE REPORT TO FOLLOW.   Report Status 01/19/2015 FINAL  Final  Culture, respiratory (NON-Expectorated)     Status: None   Collection Time: 01/19/15 12:54 AM  Result Value Ref Range Status   Specimen Description SPUTUM  Final   Special Requests NONE  Final   Gram Stain   Final    NO WBC SEEN NO SQUAMOUS EPITHELIAL CELLS SEEN FEW GRAM POSITIVE RODS FEW GRAM NEGATIVE RODS FEW GRAM POSITIVE COCCI IN PAIRS    Culture   Final    NORMAL OROPHARYNGEAL FLORA Performed at [facility]    Report Status 01/21/2015 FINAL  Final  Culture, blood (x 2)     Status: None (Preliminary result)   Collection Time: 01/19/15  3:40 PM  Result Value Ref Range Status   Specimen Description BLOOD LEFT ARM  Final   Special Requests   Final    BOTTLES DRAWN AEROBIC AND ANAEROBIC 10CC BLUE BOTTLE, Marcus  Culture   Final           BLOOD CULTURE RECEIVED NO GROWTH TO DATE CULTURE WILL BE HELD FOR 5 DAYS BEFORE ISSUING A FINAL NEGATIVE REPORT Performed at [facility]    Report Status PENDING  Incomplete    Radiology Reports Dg Chest 2 View  01/18/2015   CLINICAL DATA:  Fever and shortness breath for 1 day, chronic cough. History of diabetes, COPD and lung cancer.  EXAM: CHEST  2 VIEW  COMPARISON:  Chest radiograph December 05, 2013  FINDINGS: Cardiac silhouette is unremarkable. RIGHT  upper lobe spiculated mass again seen with single-lumen RIGHT chest Port-A-Cath in place, distal tip at superior vena cava. Vascular stent in RIGHT lung. Mild chronic interstitial changes, similar without pleural effusion or focal consolidation, similar biapical pleural thickening. Flattened hemidiaphragms. No pneumothorax. Soft tissue planes and included osseous structures are nonsuspicious. Mild scoliosis.  IMPRESSION: Stable appearance of the chest, no acute cardiopulmonary process.  COPD, stable RIGHT upper lobe spiculated mass.   Electronically Signed   By: Elon Alas   On: 01/18/2015 22:49   US Venous Img Upper Uni Left  01/07/2015   CLINICAL DATA:  Recurrent non-small cell lung carcinoma in the right upper lobe. SVC stent placed 10/17/2014. Patient presented with extensive left upper extremity DVT and underwent tPA assisted mechanical thrombectomy 11/21/2014. She returns for scheduled follow-up. Her left arm swelling has resolved and she is currently asymptomatic. She is currently on Lovenox , with plans to remain on lifelong anticoagulation.  EXAM: LEFT UPPER EXTREMITY VENOUS DOPPLER ULTRASOUND  TECHNIQUE: Gray-scale sonography with graded compression, as well as color Doppler and duplex ultrasound were performed to evaluate the upper extremity deep venous system from the level of the subclavian vein and including the jugular, axillary, basilic and upper cephalic vein. Spectral Doppler was utilized to evaluate flow at rest and with distal augmentation maneuvers.  COMPARISON:  12/01/2014 AND EARLIER STUDIES  FINDINGS: Thrombus within deep veins:  None visualized.  Compressibility of deep veins:  Normal.  Duplex waveform respiratory phasicity:  Normal.  Duplex waveform response to augmentation:  Normal.  Venous reflux:  None visualized.  Other findings: Transmitted right atrial pulsations in the left ij and subclavian veins imply patency of the central venous structures.  IMPRESSION: 1. No residual or  recurrent left upper extremity DVT.   Electronically Signed   By: Lucrezia Europe M.D.   On: 01/07/2015 14:09    CBC  Recent Labs Lab 01/16/15 1307 01/18/15 2115 01/21/15 0335 01/22/15 0435  WBC 2.3* 8.1 5.4 2.5*  HGB 10.4* 10.0* 8.0* 8.3*  HCT 32.1* 30.9* 25.0* 25.8*  PLT 198 142* 105* 91*  MCV 87.0 88.8 88.7 89.0  MCH 28.2 28.7 28.4 28.6  MCHC 32.4 32.4 32.0 32.2  RDW 20.4* 20.8* 20.2* 20.0*  LYMPHSABS 0.6* 0.4*  --   --   MONOABS 0.5 0.0*  --   --   EOSABS 0.0 0.0  --   --   BASOSABS 0.1 0.0  --   --     Chemistries   Recent Labs Lab 01/16/15 1307 01/18/15 2115 01/21/15 0335  NA 140 132* 135  K 4.2 3.8 3.4*  CL  --  99 102  CO2 22 20 26   GLUCOSE 215* 319* 177*  BUN 7.1 9 7   CREATININE 0.7 0.78 0.54  CALCIUM 8.0* 8.3* 7.6*  AST 31 36  --   ALT 33 29  --   ALKPHOS 67 59  --  BILITOT 0.28 0.5  --    ------------------------------------------------------------------------------------------------------------------ estimated creatinine clearance is 81.9 mL/min (by C-G formula based on Cr of 0.54). ------------------------------------------------------------------------------------------------------------------ No results for input(s): HGBA1C in the last 72 hours. ------------------------------------------------------------------------------------------------------------------ No results for input(s): CHOL, HDL, LDLCALC, TRIG, CHOLHDL, LDLDIRECT in the last 72 hours. ------------------------------------------------------------------------------------------------------------------ No results for input(s): TSH, T4TOTAL, T3FREE, THYROIDAB in the last 72 hours.  Invalid input(s): FREET3 ------------------------------------------------------------------------------------------------------------------ No results for input(s): VITAMINB12, FOLATE, FERRITIN, TIBC, IRON, RETICCTPCT in the last 72 hours.  Coagulation profile No results for input(s): INR, PROTIME in the last  168 hours.  No results for input(s): DDIMER in the last 72 hours.  Cardiac Enzymes No results for input(s): CKMB, TROPONINI, MYOGLOBIN in the last 168 hours.  Invalid input(s): CK ------------------------------------------------------------------------------------------------------------------ Invalid input(s): POCBNP   Recent Labs  01/21/15 0802 01/21/15 1157 01/21/15 1651 01/21/15 2102 01/22/15 0735 01/22/15 1136  GLUCAP 189* 228* 101* 222* 161* 216*     Aul Mangieri M.D. [facility] 01/22/2015, 1:53 PM  Pager: [phone]   Between 7am to 7pm - call Pager - [phone]  After 7pm go to [email or web address] - password [email or web address]  Call night coverage person covering after 7pm

## 2015-01-23 LAB — GLUCOSE, CAPILLARY
GLUCOSE-CAPILLARY: 372 mg/dL — AB (ref 70–99)
Glucose-Capillary: 242 mg/dL — ABNORMAL HIGH (ref 70–99)
Glucose-Capillary: 226 mg/dL — ABNORMAL HIGH (ref 70–99)
Glucose-Capillary: 273 mg/dL — ABNORMAL HIGH (ref 70–99)

## 2015-01-23 LAB — BASIC METABOLIC PANEL
Anion gap: 9 (ref 5–15)
BUN: 7 mg/dL (ref 6–23)
CALCIUM: 8.6 mg/dL (ref 8.4–10.5)
CO2: 30 mmol/L (ref 19–32)
Chloride: 99 mmol/L (ref 96–112)
Creatinine, Ser: 0.57 mg/dL (ref 0.50–1.10)
GLUCOSE: 230 mg/dL — AB (ref 70–99)
Potassium: 3.9 mmol/L (ref 3.5–5.1)
Sodium: 138 mmol/L (ref 135–145)

## 2015-01-23 LAB — CBC
HCT: 26.6 % — ABNORMAL LOW (ref 36.0–46.0)
HEMOGLOBIN: 8.7 g/dL — AB (ref 12.0–15.0)
MCH: 28.4 pg (ref 26.0–34.0)
MCHC: 32.7 g/dL (ref 30.0–36.0)
MCV: 86.9 fL (ref 78.0–100.0)
Platelets: 61 10*3/uL — ABNORMAL LOW (ref 150–400)
RBC: 3.06 MIL/uL — ABNORMAL LOW (ref 3.87–5.11)
RDW: 19.5 % — AB (ref 11.5–15.5)
WBC: 5.7 10*3/uL (ref 4.0–10.5)

## 2015-01-23 LAB — HEPARIN INDUCED THROMBOCYTOPENIA PNL: Heparin Induced Plt Ab: 0.173 OD (ref 0.000–0.400)

## 2015-01-23 MED ORDER — PREDNISONE 50 MG PO TABS
60.0000 mg | ORAL_TABLET | Freq: Every day | ORAL | Status: DC
  Administered 2015-01-23 – 2015-01-24 (×2): 60 mg via ORAL
  Filled 2015-01-23 (×2): qty 1

## 2015-01-23 MED ORDER — IPRATROPIUM-ALBUTEROL 0.5-2.5 (3) MG/3ML IN SOLN
3.0000 mL | RESPIRATORY_TRACT | Status: DC
  Administered 2015-01-23 – 2015-01-24 (×6): 3 mL via RESPIRATORY_TRACT
  Filled 2015-01-23 (×6): qty 3

## 2015-01-23 MED ORDER — INSULIN ASPART 100 UNIT/ML ~~LOC~~ SOLN
4.0000 [IU] | Freq: Three times a day (TID) | SUBCUTANEOUS | Status: DC
  Administered 2015-01-23 – 2015-01-24 (×5): 4 [IU] via SUBCUTANEOUS

## 2015-01-23 NOTE — Progress Notes (Signed)
Physical Therapy Treatment Patient Details Name: Janice Ball MRN: [id number] DOB: 1955/11/13 Today's Date: 02-18-15    History of Present Illness 59 yo female with onset of HCAP and current lung CA patient, history of COPD, anxiety, HLD, DM.      PT Comments    Pt reports she is not going to rehab that she will d/c home.  Pt able to tolerate increase in ambulation distance today as well.  SpO2 on room air during gait 89-92% however reapplied 2L O2 [place] upon return to room per pt request.   Follow Up Recommendations  Home health PT;Supervision - Intermittent     Equipment Recommendations  Rolling walker with 5" wheels    Recommendations for Other Services       Precautions / Restrictions Precautions Precautions: Fall Precaution Comments: monitor sats    Mobility  Bed Mobility Overal bed mobility: Modified Independent                Transfers Overall transfer level: Needs assistance Equipment used: Rolling walker (2 wheeled) Transfers: Sit to/from Stand Sit to Stand: Min guard         General transfer comment: verbal cues for hand placement  Ambulation/Gait Ambulation/Gait assistance: Min guard Ambulation Distance (Feet): 80 Feet Assistive device: Rolling walker (2 wheeled) Gait Pattern/deviations: Step-through pattern;Decreased stride length Gait velocity: decreased   General Gait Details: ambulated on RA with SpO2 89-92%, distance per pt   Stairs            Wheelchair Mobility    Modified Rankin (Stroke Patients Only)       Balance                                    Cognition Arousal/Alertness: Awake/alert Behavior During Therapy: WFL for tasks assessed/performed Overall Cognitive Status: Within Functional Limits for tasks assessed                      Exercises      General Comments        Pertinent Vitals/Pain Pain Assessment: No/denies pain    Home Living                      Prior  Function            PT Goals (current goals can now be found in the care plan section) Progress towards PT goals: Progressing toward goals    Frequency  Min 3X/week    PT Plan Current plan remains appropriate    Co-evaluation             End of Session Equipment Utilized During Treatment: Gait belt Activity Tolerance: Patient tolerated treatment well Patient left: in bed;with call bell/phone within reach     Time: 1005-1019 PT Time Calculation (min) (ACUTE ONLY): 14 min  Charges:  $Gait Training: 8-22 mins                    G Codes:      Brent Noto,KATHrine E 2015-02-18, 1:22 PM Carmelia Bake, PT, DPT 02/18/15 Pager: [phone]

## 2015-01-23 NOTE — Progress Notes (Signed)
Clinical Social Work Department BRIEF PSYCHOSOCIAL ASSESSMENT 01/23/2015  Patient:  Janice Ball, Janice Ball     Account Number:  [id number]     Admit date:  01/18/2015  Clinical Social Worker:  Glorious Peach, CLINICAL SOCIAL WORKER  Date/Time:  01/23/2015 09:31 AM  Referred by:  Physician  Date Referred:  01/23/2015 Referred for  SNF Placement   Other Referral:   Interview type:  Patient Other interview type:    PSYCHOSOCIAL DATA Living Status:  ALONE Admitted from facility:   Level of care:   Primary support name:  Geroge Baseman Primary support relationship to patient:  CHILD, ADULT Degree of support available:   Adequate    CURRENT CONCERNS Current Concerns  Post-Acute Placement   Other Concerns:    SOCIAL WORK ASSESSMENT / PLAN CSW received PT recommendation for short term rehab. CSW to meet with pt and discuss DC plans.   Assessment/plan status:  Information/Referral to [employer] Other assessment/ plan:   Information/referral to community resources:   CSW completed FL2 and faxed pt out to [facility] for bed offers.    PATIENT'S/FAMILY'S RESPONSE TO PLAN OF CARE: CSW met with pt at bedside, introduced self and explained role. CSW informed pt of PT recommendation for short term rehab at [facility]. Pt is aware of SNF and says she has visited facilities before for loved ones. Pt goes on and states that she is not that bad off and can get better at home with exercise, declining SNF. Pt is open to [facility], and wishes to speak with RN Case Manager. CSW spoke with RN Case Manager Cookie to make aware. CSW is signing off but if needed can be reached at [phone].       Glorious Peach BSW Intern

## 2015-01-23 NOTE — Progress Notes (Signed)
Janice Ball   DOB:27-May-1956   JQ#:[id number]   TMA#:[id number]  Patient Care Team: [name], NP as PCP - General (Nurse Practitioner) Curt Bears, MD as Consulting Physician (Oncology)  Subjective: Janice Ball is a 59 year old man with a history of Stage 3a NSCLCa on chemotherapy with Gemcitabine as described below, last on 01/09/15 (C4), admitted on 4/2, with 1 day history of increasing shortness of breath, productive cough and fever up to 103 F. She denied vision changes, or mucositis. Denies any chest pain or palpitations, but was tachycardic on presentation. Denies lower extremity swelling. Of note, patient has a history of LUE DVT on Lovenox as outpatient, but denies any calf tenderness or erythema. Denies nausea, heartburn or change in bowel habits. Appetite is normal. Denies any dysuria. Denies abnormal skin rashes, or neuropathy. Denies any bleeding issues such as epistaxis, hematemesis, hematuria or hematochezia. No confusion or seizures reported. Labs showed a Hb 10, platelets 142,000. These values continued to drop, with a Hb of 8.3 (today 8.7) and platelets today at 61,000. Renal functions normal.   CXR without new acute findings. He received supportive therapy with IV antibiotics, prednisone IVF, O2, nebulizers. We were kindly informed of the patient's admission prior to transfer to short term SNF.   DIAGNOSIS: Recurrent non-small cell lung cancer, squamous cell carcinoma initially diagnosis stage IIIa (T1 N2 MX ) in August of 2010.   PRIOR THERAPY:  #1 status post concurrent chemoradiation with weekly carboplatin and paclitaxel, last dose of chemotherapy given 08/05/2011.  #2 status post consolidation chemotherapy with carboplatin paclitaxel last dose given 10/27/2009.  #3 Concurrent chemoradiation with weekly carboplatin for an AUC of 2 and paclitaxel at 45 mg per meter squared given concurrent with radiotherapy, last dose was given 09/20/2011.  #4 Systemic chemotherapy  with gemcitabine 1000 mg meter squared on days 1 and 8 every 3 weeks status post 3 cycles with stable disease, last dose was given 12/20/2011.  #5 Systemic chemotherapy again with single agent gemcitabine 1000 mg/M2 on days 1 and 8 every 3 weeks. First cycle 11/07/2013. Status post 4 cycles. #6 Systemic chemotherapy again with single agent gemcitabine 1000 mg/M2 on days 1 and 8 every 3 weeks. First dose 07/24/2014. Discontinued today secondary to intolerance. #7 Immunotherapy with Nivolumab 3 MG/KG every 2 weeks. First dose 09/18/2014. She is status post 2 cycles. Last dose was given 10/02/2014 discontinued secondary to disease progression with significant SVC syndrome.  CURRENT THERAPY: Systemic chemotherapy with carboplatin for AUC of 5 on day 1 and gemcitabine 1000 MG/M2 on days 1 and 8 every 3 weeks. First dose 11/07/2014. She had a reaction to the [place] skin test dose was cycle #2 carboplatin was discontinued.  She proceeded with cycle #2 with gemcitabine only. Status post 4 cycles, last on 3/24  Scheduled Meds: . aspirin  81 mg Oral Daily  . budesonide-formoterol  2 puff Inhalation BID  . cholecalciferol  1,000 Units Oral q morning - 10a  . docusate sodium  100 mg Oral BID  . enoxaparin  120 mg Subcutaneous q1800  . glimepiride  2 mg Oral Q breakfast  . guaiFENesin  1,200 mg Oral BID  . insulin aspart  0-15 Units Subcutaneous TID WC  . insulin aspart  0-5 Units Subcutaneous QHS  . insulin aspart  4 Units Subcutaneous TID WC  . ipratropium-albuterol  3 mL Nebulization Q4H WA  . levofloxacin  750 mg Oral QPM  . levothyroxine  150 mcg Oral QAC breakfast  . magnesium  gluconate  500 mg Oral q morning - 10a  . multivitamin with minerals  1 tablet Oral q morning - 10a  . predniSONE  60 mg Oral Q breakfast  . saccharomyces boulardii  250 mg Oral BID  . sertraline  150 mg Oral QHS  . cyanocobalamin  1,000 mcg Oral q morning - 10a   Continuous Infusions:  PRN Meds:acetaminophen,  albuterol, guaiFENesin, lidocaine-prilocaine, LORazepam, oxyCODONE, prochlorperazine, sodium chloride   Objective:  Filed Vitals:   01/23/15 0447  BP: 114/70  Pulse: 97  Temp: 97.7 F (36.5 C)  Resp: 20      Intake/Output Summary (Last 24 hours) at 01/23/15 1145 Last data filed at 01/23/15 0700  Gross per 24 hour  Intake   1080 ml  Output   2100 ml  Net  -1020 ml    ECOG PERFORMANCE STATUS: 2  GENERAL:alert, no distress and comfortable SKIN: skin color, texture, turgor are normal, no rashes or significant lesions EYES: normal, conjunctiva are pink and non-injected, sclera clear OROPHARYNX:no exudate, no erythema and lips, buccal mucosa, and tongue normal  NECK: supple, thyroid normal size, non-tender, without nodularity LYMPH:  no palpable lymphadenopathy in the cervical, axillary or inguinal LUNGS: clear to auscultation and percussion with normal breathing effort HEART: regular rate & rhythm and no murmurs and no lower extremity edema ABDOMEN: soft, non-tender and normal bowel sounds Musculoskeletal:no cyanosis of digits and no clubbing  PSYCH: alert & oriented x 3 with fluent speech NEURO: no focal motor/sensory deficits    CBG (last 3)   Recent Labs  01/22/15 1719 01/22/15 2120 01/23/15 0732  GLUCAP 330* 288* 242*     Labs:   Recent Labs Lab 01/16/15 1307 01/18/15 2115 01/21/15 0335 01/22/15 0435 01/23/15 0844  WBC 2.3* 8.1 5.4 2.5* 5.7  HGB 10.4* 10.0* 8.0* 8.3* 8.7*  HCT 32.1* 30.9* 25.0* 25.8* 26.6*  PLT 198 142* 105* 91* 61*  MCV 87.0 88.8 88.7 89.0 86.9  MCH 28.2 28.7 28.4 28.6 28.4  MCHC 32.4 32.4 32.0 32.2 32.7  RDW 20.4* 20.8* 20.2* 20.0* 19.5*  LYMPHSABS 0.6* 0.4*  --   --   --   MONOABS 0.5 0.0*  --   --   --   EOSABS 0.0 0.0  --   --   --   BASOSABS 0.1 0.0  --   --   --      Chemistries:    Recent Labs Lab 01/16/15 1307 01/18/15 2115 01/21/15 0335 01/23/15 0844  NA 140 132* 135 138  K 4.2 3.8 3.4* 3.9  CL  --  99 102 99   CO2 22 20 26 30   GLUCOSE 215* 319* 177* 230*  BUN 7.1 9 7 7   CREATININE 0.7 0.78 0.54 0.57  CALCIUM 8.0* 8.3* 7.6* 8.6  AST 31 36  --   --   ALT 33 29  --   --   ALKPHOS 67 59  --   --   BILITOT 0.28 0.5  --   --     GFR Estimated Creatinine Clearance: 81.9 mL/min (by C-G formula based on Cr of 0.57).  Liver Function Tests:  Recent Labs Lab 01/16/15 1307 01/18/15 2115  AST 31 36  ALT 33 29  ALKPHOS 67 59  BILITOT 0.28 0.5  PROT 6.6 6.8  ALBUMIN 3.3* 3.6   Urine Studies     Component Value Date/Time   COLORURINE YELLOW 01/18/2015 2200   APPEARANCEUR CLEAR 01/18/2015 2200   LABSPEC 1.017 01/18/2015 2200  PHURINE 6.0 01/18/2015 2200   GLUCOSEU 500* 01/18/2015 2200   HGBUR NEGATIVE 01/18/2015 2200   BILIRUBINUR NEGATIVE 01/18/2015 2200   KETONESUR NEGATIVE 01/18/2015 2200   PROTEINUR NEGATIVE 01/18/2015 2200   UROBILINOGEN 0.2 01/18/2015 2200   NITRITE NEGATIVE 01/18/2015 2200   LEUKOCYTESUR NEGATIVE 01/18/2015 2200   CBG:  Recent Labs Lab 01/22/15 0735 01/22/15 1136 01/22/15 1719 01/22/15 2120 01/23/15 0732  GLUCAP 161* 216* 330* 288* 242*   Microbiology Cultures negative to date    Imaging Studies:  No results found.  Assessment/Plan: 59 y.o.   Recurrent non-small cell lung cancer, squamous cell carcinoma initially diagnosis stage IIIa (T1 N2 MX  Currently on gemcitabine 1000 MG/M2 on days 1 and 8 every 3 weeks. First dose 11/07/2014. 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 Doubt HIT. Counts are 61,000 No acute bleeding issues noted. Continue on Lovenox unless platelet count drops to less than 50,000. No transfusion is indicated at this time Transfuse 1 unit of platelets if count is less or equal than 10,000 or 20,000 if  the patient is acutely bleeding  Anemia in neoplastic disease In the setting of recent  Chemotherapy, dilution, possible sepsis, antibiotics No transfusion is indicated at this time Monitor counts closely, counts are already recovering slowly Transfuse blood to maintain a Hb of 8 g or if the patient is acutely bleeding   Full Code   Other medical issues including possible pneumonia, DM, COPD as per admitting team   [facility] E, PA-C 01/23/2015  11:45 AM  Attending Note  I personally saw the patient, reviewed the chart and examined the patient. The plan of care was discussed with the patient. I agree with the assessment and plan as documented above. Thank you very much for the consultation.  1. Thrombocytopenia: Related to gemcitabine chemotherapy. There is no clinical concern for heparin-induced cytopenia. We felt that Lovenox is safe even with a platelet count of 61,000. Her platelet count is expected to improve over the next week. Today her platelet count is up to 78,000 and is expected to continue to climb up.  2. Recurrent squamous cell non-small cell lung cancer: Dr. [name] will follow the patient and continue oncology care as an outpatient.  Hematology oncology will sign off. If there were any further questions please do not stick to call us. Thank you for the wonderful care.

## 2015-01-23 NOTE — Progress Notes (Signed)
[facility]                                                                              Patient Demographics  Janice Ball, is a 59 y.o. female, DOB - [birth year], WLS:[id number]  Admit date - 01/18/2015   Admitting Physician Thurnell Lose, MD  Outpatient Primary MD for the patient is [name], NP  LOS - 5   Chief Complaint  Patient presents with  . Fever       Brief HPI   59 year old female with past medical history of recurrent non small cell lung cancer, stage 3a (under Dr. Worthy Flank care), diagnosed in 05/2009, undergoing current chemotherapy with gemcitabine (carboplatin stopped because of reaction to [place] skin test dose), left upper extremity DVT (on anticoagulation with Lovenox), diabetes, hypothyroidism, depression, grade 1 diastolic dysfunction (Last 2 D ECHO in 10/2014 with preserved EF showing grade 1 DD), COPD. She has last chemotherapy 01/09/2015 (Gemcitabine). Patient presented to [facility] ED with worsening shortness of breath, productive cough, fever for past 1 day prior to this admission. In ED, BP was 113/60, HR 104-134, RR 22-26, T max 103.2 F and oxygen saturation 95% on Angie oxygen support. Blood work showed hemoglobin of 10, platelets of 142. CXR showed stable appearance of the chest, stable right upper lung lobe spiculated mass. She was started on empiric vanco and cefepime for treatment of pneumonia.   Assessment & Plan   Principal Problem:  Sepsis secondary to HCAP, lobar pneumonia / COPD exacerbation;- Sepsis criteria met on admission with fever, tachycardia, tachypnea, lactic acid of 4.66, evidence of infection clinically - Feels a lot better today, wheezing improved, transitioned to oral prednisone, continue Symbicort, flutter valve, scheduled DuoNeb's. - Influenza panel negative, HIV negative, urine strep antigen negative, urine legionella antigen negative, blood cultures negative so far.  - Changed to Levaquin yesterday, day #3 today,  completed 4 days of IV vancomycin and cefepime   Active Problems:  Left upper extremity DVT- patient has a history of extensive left upper extremities DVT and underwent TPA assisted mechanical thrombectomy on 11/21/14. Patient was recommended to remain on lifelong anticoagulation with Lovenox. -Currently on Lovenox however platelets trending down, 61K today - Although thrombocytopenia could be due to ongoing chemotherapy, I did order hit panel yesterday, will take a few days for the results. D/w hematology, on call, Dr Lindi Adie recommended to continue with the Lovenox for now as long as platelets are above 50,000.   Thrombocytopenia: Platelets count decreasing, possibly due to #1, chemotherapy - Patient is a however on Lovenox therapy for DVT, follow hit panel and follow counts   Primary cancer of right upper lobe of lung - Last chemotherapy with gemcitabine 01/09/2015    Hypothyroidism - TSH 14 on this admission. - increased levothyroxine to 150 mcg daily instead of 137 mcg daily.   DM type 2 (diabetes mellitus, type 2), controlled - Continue amaryl and SSI for now.  - metformin on hold for now but can be resumed upon discharge    Anemia of chronic disease / thrombocytopenia - Secondary to history of malignancy and sequela of chemotherapy -  hemoglobin currently  stable   Non severe Protein calorie malnutrition - in the context of acute illness, nutrition consult placed   Code Status: Full code  family Communication: Discussed in detail with the patient, all imaging results, lab results explained to the patient    Disposition Plan; Hopefully in 24-48 hours, PT rec SNF  Time Spent in minutes 43mnutes  Procedures  none  Consults   none  DVT Prophylaxis  Lovenox   Medications  Scheduled Meds: . aspirin  81 mg Oral Daily  . budesonide-formoterol  2 puff Inhalation BID  . cholecalciferol  1,000 Units Oral q morning - 10a  . docusate sodium  100 mg Oral BID  .  enoxaparin  120 mg Subcutaneous q1800  . glimepiride  2 mg Oral Q breakfast  . guaiFENesin  1,200 mg Oral BID  . insulin aspart  0-15 Units Subcutaneous TID WC  . insulin aspart  0-5 Units Subcutaneous QHS  . insulin aspart  4 Units Subcutaneous TID WC  . ipratropium-albuterol  3 mL Nebulization Q4H WA  . levofloxacin  750 mg Oral QPM  . levothyroxine  150 mcg Oral QAC breakfast  . magnesium gluconate  500 mg Oral q morning - 10a  . multivitamin with minerals  1 tablet Oral q morning - 10a  . predniSONE  60 mg Oral Q breakfast  . saccharomyces boulardii  250 mg Oral BID  . sertraline  150 mg Oral QHS  . cyanocobalamin  1,000 mcg Oral q morning - 10a   Continuous Infusions:  PRN Meds:.acetaminophen, albuterol, guaiFENesin, lidocaine-prilocaine, LORazepam, oxyCODONE, prochlorperazine, sodium chloride   Antibiotics   Anti-infectives    Start     Dose/Rate Route Frequency Ordered Stop   01/21/15 1830  levofloxacin (LEVAQUIN) tablet 750 mg     750 mg Oral Every evening 01/21/15 1746     01/20/15 2200  vancomycin (VANCOCIN) IVPB 1000 mg/200 mL premix  Status:  Discontinued     1,000 mg 200 mL/hr over 60 Minutes Intravenous 3 times per day 01/20/15 1531 01/21/15 1746   01/20/15 2200  ceFEPIme (MAXIPIME) 1 g in dextrose 5 % 50 mL IVPB  Status:  Discontinued     1 g 100 mL/hr over 30 Minutes Intravenous 3 times per day 01/20/15 1531 01/21/15 1746   01/18/15 2345  ceFEPIme (MAXIPIME) 2 g in dextrose 5 % 50 mL IVPB  Status:  Discontinued     2 g 100 mL/hr over 30 Minutes Intravenous 3 times per day 01/18/15 2332 01/20/15 1531   01/18/15 2330  vancomycin (VANCOCIN) IVPB 750 mg/150 ml premix  Status:  Discontinued     750 mg 150 mL/hr over 60 Minutes Intravenous 3 times per day 01/18/15 2324 01/20/15 1531   01/18/15 2315  ceFEPIme (MAXIPIME) 1 g in dextrose 5 % 50 mL IVPB  Status:  Discontinued     1 g 100 mL/hr over 30 Minutes Intravenous 3 times per day 01/18/15 2309 01/18/15 2332         Subjective:   BCierria Heightwas seen and examined today.   not feeling too well today, complaining of shortness of breath and wheezing. No nausea, vomiting, diarrhea, abdominal pain fevers or chills. No acute events overnight.    Objective:   Blood pressure 114/70, pulse 97, temperature 97.7 F (36.5 C), temperature source Oral, resp. rate 20, height 5' 7"  (1.702 m), weight 81.2 kg (179 lb 0.2 oz), SpO2 94 %.  Wt Readings from Last 3 Encounters:  01/19/15 81.2 kg (  179 lb 0.2 oz)  01/09/15 77.61 kg (171 lb 1.6 oz)  12/19/14 77.883 kg (171 lb 11.2 oz)     Intake/Output Summary (Last 24 hours) at 01/23/15 1139 Last data filed at 01/23/15 0700  Gross per 24 hour  Intake   1080 ml  Output   2100 ml  Net  -1020 ml    Exam  General: Alert and oriented x 3, NAD  HEENT:  PERRLA, EOMI, Anicteic Sclera, mucous membranes moist.   Neck: Supple, no JVD, no masses  CVS: S1 S2 auscultated, no rubs, murmurs or gallops. Regular rate and rhythm.  Respiratory: diffuse expiratory wheezing bilaterally  Abdomen: Soft, nontender, nondistended, + bowel sounds  Ext: no cyanosis clubbing or edema  Neuro: AAOx3, Cr N's II- XII. Strength 5/5 upper and lower extremities bilaterally  Skin: No rashes  Psych: Normal affect and demeanor, alert and oriented x3    Data Review   Micro Results Recent Results (from the past 240 hour(s))  Culture, blood (routine x 2)     Status: None (Preliminary result)   Collection Time: 01/18/15  9:40 PM  Result Value Ref Range Status   Specimen Description BLOOD RIGHT PORT  Final   Special Requests BOTTLES DRAWN AEROBIC AND ANAEROBIC 4CC  Final   Culture   Final           BLOOD CULTURE RECEIVED NO GROWTH TO DATE CULTURE WILL BE HELD FOR 5 DAYS BEFORE ISSUING A FINAL NEGATIVE REPORT Performed at [facility]    Report Status PENDING  Incomplete  Culture, blood (routine x 2)     Status: None (Preliminary result)   Collection Time: 01/18/15   9:47 PM  Result Value Ref Range Status   Specimen Description BLOOD BLOOD LEFT FOREARM  Final   Special Requests BOTTLES DRAWN AEROBIC AND ANAEROBIC 5ML  Final   Culture   Final           BLOOD CULTURE RECEIVED NO GROWTH TO DATE CULTURE WILL BE HELD FOR 5 DAYS BEFORE ISSUING A FINAL NEGATIVE REPORT Performed at [facility]    Report Status PENDING  Incomplete  Urine culture     Status: None   Collection Time: 01/18/15 10:00 PM  Result Value Ref Range Status   Specimen Description URINE, CLEAN CATCH  Final   Special Requests NONE  Final   Colony Count   Final    3,000 COLONIES/ML Performed at [facility]    Culture   Final    INSIGNIFICANT GROWTH Performed at [facility]    Report Status 01/20/2015 FINAL  Final  Culture, sputum-assessment     Status: None   Collection Time: 01/19/15 12:54 AM  Result Value Ref Range Status   Specimen Description SPUTUM  Final   Special Requests NONE  Final   Sputum evaluation   Final    THIS SPECIMEN IS ACCEPTABLE. RESPIRATORY CULTURE REPORT TO FOLLOW.   Report Status 01/19/2015 FINAL  Final  Culture, respiratory (NON-Expectorated)     Status: None   Collection Time: 01/19/15 12:54 AM  Result Value Ref Range Status   Specimen Description SPUTUM  Final   Special Requests NONE  Final   Gram Stain   Final    NO WBC SEEN NO SQUAMOUS EPITHELIAL CELLS SEEN FEW GRAM POSITIVE RODS FEW GRAM NEGATIVE RODS FEW GRAM POSITIVE COCCI IN PAIRS    Culture   Final    NORMAL OROPHARYNGEAL FLORA Performed at [facility]  Report Status 01/21/2015 FINAL  Final  Culture, blood (x 2)     Status: None (Preliminary result)   Collection Time: 01/19/15  3:40 PM  Result Value Ref Range Status   Specimen Description BLOOD LEFT ARM  Final   Special Requests   Final    BOTTLES DRAWN AEROBIC AND ANAEROBIC 10CC BLUE BOTTLE, [place] RED BOTTLE   Culture   Final           BLOOD CULTURE RECEIVED NO GROWTH TO DATE CULTURE WILL BE HELD  FOR 5 DAYS BEFORE ISSUING A FINAL NEGATIVE REPORT Performed at [facility]    Report Status PENDING  Incomplete    Radiology Reports Dg Chest 2 View  01/18/2015   CLINICAL DATA:  Fever and shortness breath for 1 day, chronic cough. History of diabetes, COPD and lung cancer.  EXAM: CHEST  2 VIEW  COMPARISON:  Chest radiograph December 05, 2013  FINDINGS: Cardiac silhouette is unremarkable. RIGHT upper lobe spiculated mass again seen with single-lumen RIGHT chest Port-A-Cath in place, distal tip at superior vena cava. Vascular stent in RIGHT lung. Mild chronic interstitial changes, similar without pleural effusion or focal consolidation, similar biapical pleural thickening. Flattened hemidiaphragms. No pneumothorax. Soft tissue planes and included osseous structures are nonsuspicious. Mild scoliosis.  IMPRESSION: Stable appearance of the chest, no acute cardiopulmonary process.  COPD, stable RIGHT upper lobe spiculated mass.   Electronically Signed   By: Elon Alas   On: 01/18/2015 22:49   US Venous Img Upper Uni Left  01/07/2015   CLINICAL DATA:  Recurrent non-small cell lung carcinoma in the right upper lobe. SVC stent placed 10/17/2014. Patient presented with extensive left upper extremity DVT and underwent tPA assisted mechanical thrombectomy 11/21/2014. She returns for scheduled follow-up. Her left arm swelling has resolved and she is currently asymptomatic. She is currently on Lovenox , with plans to remain on lifelong anticoagulation.  EXAM: LEFT UPPER EXTREMITY VENOUS DOPPLER ULTRASOUND  TECHNIQUE: Gray-scale sonography with graded compression, as well as color Doppler and duplex ultrasound were performed to evaluate the upper extremity deep venous system from the level of the subclavian vein and including the jugular, axillary, basilic and upper cephalic vein. Spectral Doppler was utilized to evaluate flow at rest and with distal augmentation maneuvers.  COMPARISON:  12/01/2014 AND  EARLIER STUDIES  FINDINGS: Thrombus within deep veins:  None visualized.  Compressibility of deep veins:  Normal.  Duplex waveform respiratory phasicity:  Normal.  Duplex waveform response to augmentation:  Normal.  Venous reflux:  None visualized.  Other findings: Transmitted right atrial pulsations in the left ij and subclavian veins imply patency of the central venous structures.  IMPRESSION: 1. No residual or recurrent left upper extremity DVT.   Electronically Signed   By: Lucrezia Europe M.D.   On: 01/07/2015 14:09    CBC  Recent Labs Lab 01/16/15 1307 01/18/15 2115 01/21/15 0335 01/22/15 0435 01/23/15 0844  WBC 2.3* 8.1 5.4 2.5* 5.7  HGB 10.4* 10.0* 8.0* 8.3* 8.7*  HCT 32.1* 30.9* 25.0* 25.8* 26.6*  PLT 198 142* 105* 91* 61*  MCV 87.0 88.8 88.7 89.0 86.9  MCH 28.2 28.7 28.4 28.6 28.4  MCHC 32.4 32.4 32.0 32.2 32.7  RDW 20.4* 20.8* 20.2* 20.0* 19.5*  LYMPHSABS 0.6* 0.4*  --   --   --   MONOABS 0.5 0.0*  --   --   --   EOSABS 0.0 0.0  --   --   --   BASOSABS 0.1  0.0  --   --   --     Chemistries   Recent Labs Lab 01/16/15 1307 01/18/15 2115 01/21/15 0335 01/23/15 0844  NA 140 132* 135 138  K 4.2 3.8 3.4* 3.9  CL  --  99 102 99  CO2 22 20 26 30   GLUCOSE 215* 319* 177* 230*  BUN 7.1 9 7 7   CREATININE 0.7 0.78 0.54 0.57  CALCIUM 8.0* 8.3* 7.6* 8.6  AST 31 36  --   --   ALT 33 29  --   --   ALKPHOS 67 59  --   --   BILITOT 0.28 0.5  --   --    ------------------------------------------------------------------------------------------------------------------ estimated creatinine clearance is 81.9 mL/min (by C-G formula based on Cr of 0.57). ------------------------------------------------------------------------------------------------------------------ No results for input(s): HGBA1C in the last 72 hours. ------------------------------------------------------------------------------------------------------------------ No results for input(s): CHOL, HDL, LDLCALC, TRIG,  CHOLHDL, LDLDIRECT in the last 72 hours. ------------------------------------------------------------------------------------------------------------------ No results for input(s): TSH, T4TOTAL, T3FREE, THYROIDAB in the last 72 hours.  Invalid input(s): FREET3 ------------------------------------------------------------------------------------------------------------------ No results for input(s): VITAMINB12, FOLATE, FERRITIN, TIBC, IRON, RETICCTPCT in the last 72 hours.  Coagulation profile No results for input(s): INR, PROTIME in the last 168 hours.  No results for input(s): DDIMER in the last 72 hours.  Cardiac Enzymes No results for input(s): CKMB, TROPONINI, MYOGLOBIN in the last 168 hours.  Invalid input(s): CK ------------------------------------------------------------------------------------------------------------------ Invalid input(s): [place]  01/21/15 2102 01/22/15 0735 01/22/15 1136 01/22/15 1719 01/22/15 2120 01/23/15 0732  GLUCAP 222* 161* 216* 330* 288* 242*     Zian Mohamed M.D. [facility] 01/23/2015, 11:39 AM  Pager: [phone]   Between 7am to 7pm - call Pager - [phone]  After 7pm go to [email or web address] - password [email or web address]  Call night coverage person covering after 7pm

## 2015-01-23 NOTE — Care Management Note (Addendum)
    Page 1 of 2   01/24/2015     11:54:03 AM CARE MANAGEMENT NOTE 01/24/2015  Patient:  Janice Ball, Janice Ball   Account Number:  [id number]  Date Initiated:  01/22/2015  Documentation initiated by:  Edwyna Shell  Subjective/Objective Assessment:   59 yo female admitted with sepsis     Action/Plan:   discharge planning   Anticipated DC Date:  01/24/2015   Anticipated DC Plan:  [place] referral  Clinical Social Worker      DC Forensic scientist  CM consult      [facility] Choice  HOME HEALTH   Choice offered to / List presented to:  C-1 Patient   DME arranged  [facility]assie Moselle      DME agency  [place] arranged  HH-1 RN  [place] agency  [place]   Status of service:  Completed, signed off Medicare Important Message given?  YES (If response is "NO", the following Medicare IM given date fields will be blank) Date Medicare IM given:  01/22/2015 Medicare IM given by:  Edwyna Shell Date Additional Medicare IM given:  01/24/2015 Additional Medicare IM given by:  Sunday Spillers  Discharge Disposition:  [place]  Per UR Regulation:  Reviewed for med. necessity/level of care/duration of stay  If discussed at [place] of Stay Meetings, dates discussed:    Comments:  01-24-15 Sunday Spillers RN CM 1100 Spoke with patient again at bedside regarding [facility] services. Patient reluctant but agreed to [facility], provided patient with at list for choice. [facility] was chosen, contacted Tim for referral. He will provide services with start of care 4/11.  01/23/15 CM notes pt refusing SNF but is open to discuss [facility] services.  CM met with pt in room to discuss discharge needs.  Pt has rolling walker and cane at home.  Pt refuses all [place] services.  Pt states she gets around ok and will have daughgter at home to help her.  CM let pt know if she changes her mind, notify her RN and CM will arrange [place] per recc. No  other CM needs were communicated.  Janice Ball, BSN, Cm [phone].  01/22/15 Edwyna Shell RN BSN CM [phone] chart reviewed

## 2015-01-24 DIAGNOSIS — D6959 Other secondary thrombocytopenia: Secondary | ICD-10-CM

## 2015-01-24 DIAGNOSIS — D630 Anemia in neoplastic disease: Secondary | ICD-10-CM

## 2015-01-24 LAB — CBC
HEMATOCRIT: 26 % — AB (ref 36.0–46.0)
HEMOGLOBIN: 8.4 g/dL — AB (ref 12.0–15.0)
MCH: 28.9 pg (ref 26.0–34.0)
MCHC: 32.3 g/dL (ref 30.0–36.0)
MCV: 89.3 fL (ref 78.0–100.0)
Platelets: 78 10*3/uL — ABNORMAL LOW (ref 150–400)
RBC: 2.91 MIL/uL — ABNORMAL LOW (ref 3.87–5.11)
RDW: 20.5 % — ABNORMAL HIGH (ref 11.5–15.5)
WBC: 9.2 10*3/uL (ref 4.0–10.5)

## 2015-01-24 LAB — BASIC METABOLIC PANEL
Anion gap: 9 (ref 5–15)
BUN: 12 mg/dL (ref 6–23)
CO2: 30 mmol/L (ref 19–32)
Calcium: 8.5 mg/dL (ref 8.4–10.5)
Chloride: 100 mmol/L (ref 96–112)
Creatinine, Ser: 0.6 mg/dL (ref 0.50–1.10)
GFR calc Af Amer: >90 mL/min (ref >90)
GFR calc non Af Amer: >90 mL/min (ref >90)
GLUCOSE: 146 mg/dL — AB (ref 70–99)
POTASSIUM: 3.3 mmol/L — AB (ref 3.5–5.1)
Sodium: 139 mmol/L (ref 135–145)

## 2015-01-24 LAB — GLUCOSE, CAPILLARY
Glucose-Capillary: 138 mg/dL — ABNORMAL HIGH (ref 70–99)
Glucose-Capillary: 212 mg/dL — ABNORMAL HIGH (ref 70–99)

## 2015-01-24 MED ORDER — HEPARIN SOD (PORK) LOCK FLUSH 100 UNIT/ML IV SOLN
500.0000 [IU] | INTRAVENOUS | Status: AC | PRN
  Administered 2015-01-24: 500 [IU]

## 2015-01-24 MED ORDER — GUAIFENESIN 100 MG/5ML PO SOLN: 5.0000 mL | ORAL | Status: DC | PRN

## 2015-01-24 MED ORDER — LEVOFLOXACIN 750 MG PO TABS
750.0000 mg | ORAL_TABLET | ORAL | Status: DC
  Administered 2015-01-24: 750 mg via ORAL
  Filled 2015-01-24: qty 1

## 2015-01-24 MED ORDER — POTASSIUM CHLORIDE CRYS ER 20 MEQ PO TBCR
40.0000 meq | EXTENDED_RELEASE_TABLET | Freq: Once | ORAL | Status: AC
  Administered 2015-01-24: 40 meq via ORAL
  Filled 2015-01-24: qty 2

## 2015-01-24 MED ORDER — LEVOFLOXACIN 750 MG PO TABS: 750.0000 mg | ORAL_TABLET | Freq: Every day | ORAL | Status: DC

## 2015-01-24 MED ORDER — PREDNISONE 10 MG PO TABS: ORAL_TABLET | ORAL | Status: DC

## 2015-01-24 NOTE — Progress Notes (Signed)
ANTICOAGULATION CONSULT NOTE - Follow-up  Pharmacy Consult for enoxaparin Indication: history of left upper arm DVT on Lovenox indefinitely  Allergies  Allergen Reactions  . Carboplatin Rash    Skin test reaction  . Sulfonamide Derivatives Rash  . Codeine Other (See Comments)    hallucinations  . Dilaudid [Hydromorphone Hcl] Nausea And Vomiting and Other (See Comments)    Room started spinning.  Pt has been okay with low doses and nausea meds administered first  . Penicillins Other (See Comments)    Childhood allergy; reaction unknown  . Vicodin [Hydrocodone-Acetaminophen] Rash    Patient Measurements: Height: 5\' 7"  (170.2 cm) Weight: 179 lb 0.2 oz (81.2 kg) IBW/kg (Calculated) : 61.6 Heparin Dosing Weight:   Vital Signs: Temp: 98.1 F (36.7 C) (04/08 0551) Temp Source: Oral (04/08 0551) BP: 128/73 mmHg (04/08 0551) Pulse Rate: 98 (04/08 0551)  Labs:  Recent Labs  01/22/15 0435 01/23/15 0844 01/24/15 0450  HGB 8.3* 8.7* 8.4*  HCT 25.8* 26.6* 26.0*  PLT 91* 61* 78*  CREATININE  --  0.57 0.60    Estimated Creatinine Clearance: 81.9 mL/min (by C-G formula based on Cr of 0.6).   Medications:  Scheduled:  . aspirin  81 mg Oral Daily  . budesonide-formoterol  2 puff Inhalation BID  . cholecalciferol  1,000 Units Oral q morning - 10a  . docusate sodium  100 mg Oral BID  . enoxaparin  120 mg Subcutaneous q1800  . glimepiride  2 mg Oral Q breakfast  . guaiFENesin  1,200 mg Oral BID  . insulin aspart  0-15 Units Subcutaneous TID WC  . insulin aspart  0-5 Units Subcutaneous QHS  . insulin aspart  4 Units Subcutaneous TID WC  . ipratropium-albuterol  3 mL Nebulization Q4H WA  . levofloxacin  750 mg Oral QPM  . levothyroxine  150 mcg Oral QAC breakfast  . magnesium gluconate  500 mg Oral q morning - 10a  . multivitamin with minerals  1 tablet Oral q morning - 10a  . predniSONE  60 mg Oral Q breakfast  . saccharomyces boulardii  250 mg Oral BID  . sertraline  150  mg Oral QHS  . cyanocobalamin  1,000 mcg Oral q morning - 10a    Assessment: 58 YOF history of left upper arm DVT on Lovenox indefinitely, history of intermittent hemoptysis.    Today, 01/24/2015:  CBC: Hgb low/stable, pltc 78 - improved from 4/7 (ptlc = 198 at admission, and 262 on 3/24 but previous bouts thrombocytopenia noted - ? Secondary to chemo). S/p gemcitabine 3/31  HIT ab: negative  Renal: SCr WNL on 4/5 with CrCl > 44ml/min  No bleeding noted  Goal of Therapy:  Anti-Xa level 0.6-1 units/ml 4hrs after LMWH dose given Monitor platelets by anticoagulation protocol: Yes   Plan:   Continue with enoxaparin 120mg  SQ 24h as per home dosing   Ensure CBC checked at least q72h while in hospital  Oncology rec continue LMWH unless pltc fall <50  Suspect chemotherapy playing role in causing thrombocytopenia  Doreene Eland, PharmD, BCPS.   Pager: [phone]  01/24/2015,8:19 AM

## 2015-01-24 NOTE — Progress Notes (Signed)
SATURATION QUALIFICATIONS: (This note is used to comply with regulatory documentation for home oxygen)  Patient Saturations on Room Air at Rest = 94%  Patient Saturations on Room Air while Ambulating = 93%  Patient Saturations on zero Liters of oxygen while Ambulating = 93%  Please briefly explain why patient needs home oxygen:

## 2015-01-24 NOTE — Progress Notes (Signed)
Nurse Secretary reports she could not schedule appointment with patient's doctor due to office is closed.

## 2015-01-24 NOTE — Discharge Summary (Signed)
Physician Discharge Summary   Patient ID: SYENNA NAZIR MRN: [id number] DOB/AGE: [birth year] 59 y.o.  Admit date: 01/18/2015 Discharge date: 01/24/2015  Primary Care Physician:  [name], NP  Discharge Diagnoses:    . Sepsis secondary to lobar pneumonia  . Lung cancer . COPD, moderate with exacerbation  . Primary cancer of right upper lobe of lung . Hypothyroidism . Hemoptysis Thrombocytopenia  Consults:  Hematology oncology, Dr Lindi Adie   Recommendations for Outpatient Follow-up:  Please follow hit panel that was ordered during the hospitalization Please follow CBC and platelet count   DIET: Carb modified diet    Allergies:   Allergies  Allergen Reactions  . Carboplatin Rash    Skin test reaction  . Sulfonamide Derivatives Rash  . Codeine Other (See Comments)    hallucinations  . Dilaudid [Hydromorphone Hcl] Nausea And Vomiting and Other (See Comments)    Room started spinning.  Pt has been okay with low doses and nausea meds administered first  . Penicillins Other (See Comments)    Childhood allergy; reaction unknown  . Vicodin [Hydrocodone-Acetaminophen] Rash     Discharge Medications:   Medication List    TAKE these medications        acetaminophen 325 MG tablet  Commonly known as:  TYLENOL  Take 650 mg by mouth every 6 (six) hours as needed for mild pain or headache.     alendronate 10 MG tablet  Commonly known as:  FOSAMAX  Take 10 mg by mouth daily.     aspirin 81 MG tablet  Take 1 tablet (81 mg total) by mouth daily with breakfast.     [employer] ASPIRIN ADULT LOW DOSE 81 MG chewable tablet  Generic drug:  aspirin  Chew 81 mg by mouth daily.     budesonide-formoterol 160-4.5 MCG/ACT inhaler  Commonly known as:  SYMBICORT  Inhale 2 puffs into the lungs 2 (two) times daily.     cholecalciferol 1000 UNITS tablet  Commonly known as:  VITAMIN D  Take 1,000 Units by mouth every morning.     cyanocobalamin 1000 MCG tablet  Take 1,000 mcg by mouth  every morning.     DSS 100 MG Caps  Take 100 mg by mouth 2 (two) times daily.     enoxaparin 120 MG/0.8ML injection  Commonly known as:  LOVENOX  Inject 0.8 mLs (120 mg total) into the skin daily.     glimepiride 2 MG tablet  Commonly known as:  AMARYL  Take 2 mg by mouth daily with breakfast.     guaiFENesin 600 MG 12 hr tablet  Commonly known as:  MUCINEX  Take 1,200 mg by mouth 2 (two) times daily.     guaiFENesin 100 MG/5ML Soln  Commonly known as:  ROBITUSSIN  Take 5-10 mLs (100-200 mg total) by mouth every 4 (four) hours as needed for cough.     ipratropium 0.02 % nebulizer solution  Commonly known as:  ATROVENT  Take 2.5 mLs (0.5 mg total) by nebulization 4 (four) times daily.     levofloxacin 750 MG tablet  Commonly known as:  LEVAQUIN  Take 1 tablet (750 mg total) by mouth daily.     levothyroxine 137 MCG tablet  Commonly known as:  SYNTHROID, LEVOTHROID  Take 137 mcg by mouth daily.     lidocaine-prilocaine cream  Commonly known as:  EMLA  Apply 1 application topically as needed. Apply to port 1 hr before chemo     LORazepam 1 MG tablet  Commonly  known as:  ATIVAN  Take 1 mg by mouth 3 (three) times daily as needed for anxiety.     magnesium gluconate 500 MG tablet  Commonly known as:  MAGONATE  Take 500 mg by mouth every morning.     metFORMIN 1000 MG tablet  Commonly known as:  GLUCOPHAGE  Take 1,000 mg by mouth 2 (two) times daily with a meal.     multivitamin with minerals Tabs tablet  Take 1 tablet by mouth every morning.     OVER THE COUNTER MEDICATION  Take 1 capsule by mouth daily. MSM. For energy and joint pain     oxyCODONE 5 MG immediate release tablet  Commonly known as:  Oxy IR/ROXICODONE  Take 1 tablet (5 mg total) by mouth every 4 (four) hours as needed for moderate pain.     predniSONE 10 MG tablet  Commonly known as:  DELTASONE  - Prednisone dosing: Take  Prednisone 72m (4 tabs) x 3 days, then taper to 359m(3 tabs) x 3 days,  then 2047m2 tabs) x 3days, then 59m36m tab) x 3days, then OFF.  -   - Dispense:  30 tabs, refills: None     PROAIR HFA 108 (90 BASE) MCG/ACT inhaler  Generic drug:  albuterol  INHALE 2 PUFFS INTO THE LUNGS EVERY 6 (SIX) HOURS AS NEEDED FOR WHEEZING OR SHORTNESS OF BREATH.     prochlorperazine 10 MG tablet  Commonly known as:  COMPAZINE  Take 1 tablet (10 mg total) by mouth every 6 (six) hours as needed for nausea or vomiting.     saccharomyces boulardii 250 MG capsule  Commonly known as:  FLORASTOR  Take 1 capsule (250 mg total) by mouth 2 (two) times daily.     sertraline 100 MG tablet  Commonly known as:  ZOLOFT  Take 150 mg by mouth at bedtime.     spironolactone 25 MG tablet  Commonly known as:  ALDACTONE  Take 25 mg by mouth every morning.         Brief H and P: For complete details please refer to admission H and P, but in brief 59 y34r old female with past medical history of recurrent non small cell lung cancer, stage 3a (under Dr. MohaWorthy Flanke), diagnosed in 05/2009, undergoing current chemotherapy with gemcitabine (carboplatin stopped because of reaction to carb[place]otswan[place] test dose), left upper extremity DVT (on anticoagulation with Lovenox), diabetes, hypothyroidism, depression, grade 1 diastolic dysfunction (Last 2 D ECHO in 10/2014 with preserved EF showing grade 1 DD), COPD. She has last chemotherapy 01/09/2015 (Gemcitabine). [facility], [facility], fever for past 1 day prior to this admission. In ED, BP was 113/60, HR 104-134, RR 22-26, T max 103.2 F and oxygen saturation 95% on [place] oxygen support. Blood work showed hemoglobin of 10, platelets of 142. CXR showed stable appearance of the chest, stable right upper lung lobe spiculated mass. She was started on empiric vanco and cefepime for treatment of pneumonia.  Hospital Course:  Sepsis secondary to HCAP, lobar pneumonia / COPD exacerbation;- Sepsis criteria met  on admission with fever, tachycardia, tachypnea, lactic acid of 4.66, evidence of infection clinically The patient was placed on IV vancomycin and cefepime for broad-spectrum antibiotics, she was transitioned to oral Levaquin upon discharge. She did have significant wheezing hence was placed on IV Solu-Medrol, she has been transitioned to oral prednisone now. Continue Symbicort, patient to continue scheduled bronchodilators 4 times a day  at home. Influenza panel negative, HIV negative, urine strep antigen negative, urine legionella antigen negative, blood cultures negative so far.  Home O2 evaluation was done and did not qualify for home O2. Patient is now ambulating without any difficulty. PT evaluation was done and recommended walker and home health. Patient did not want to go to any short-term rehabilitation. Home health PT, OT, was set up with [facility]. Home RN was also arrange for follow-up. Patient declined home health aide stating that she is functional with her ADLs.  Left upper extremity DVT- patient has a history of extensive left upper extremities DVT and underwent TPA assisted mechanical thrombectomy on 11/21/14. Patient was recommended to remain on lifelong anticoagulation with Lovenox. -Currently on Lovenox indefinitely, platelets was noticed to be trending down to 61,000 lowest on 4/7. Hit panel was ordered, currently pending. Hematology consult was obtained, patient was seen by Dr Lindi Adie who felt that thrombocytopenia could be due to ongoing chemotherapy and sepsis due to #1, continue Lovenox as long as platelets are above 50,000. Platelet count is improving today prior to discharge.  Thrombocytopenia: Platelets count decreasing, possibly due to #1, chemotherapy - Patient is a however on Lovenox therapy for DVT, follow hit panel and follow counts    Primary cancer of right upper lobe of lung - Last chemotherapy with gemcitabine 01/09/2015    Hypothyroidism - TSH 14 on this  admission. - increased levothyroxine to 150 mcg daily instead of 137 mcg daily.   DM type 2 (diabetes mellitus, type 2), controlled Continue home regimen   Anemia of chronic disease / thrombocytopenia - Secondary to history of malignancy and sequela of chemotherapy - hemoglobin currently stable   Non severe Protein calorie malnutrition - in the context of acute illness, nutrition consult placed    Day of Discharge BP 128/73 mmHg  Pulse 98  Temp(Src) 98.1 F (36.7 C) (Oral)  Resp 18  Ht 5' 7"  (1.702 m)  Wt 81.2 kg (179 lb 0.2 oz)  BMI 28.03 kg/m2  SpO2 94%  Physical Exam: General: Alert and awake oriented x3 not in any acute distress. HEENT: anicteric sclera, pupils reactive to light and accommodation CVS: S1-S2 clear no murmur rubs or gallops Chest: clear to auscultation bilaterally, no wheezing rales or rhonchi Abdomen: soft nontender, nondistended, normal bowel sounds Extremities: no cyanosis, clubbing or edema noted bilaterally Neuro: Cranial nerves II-XII intact, no focal neurological deficits   The results of significant diagnostics from this hospitalization (including imaging, microbiology, ancillary and laboratory) are listed below for reference.    LAB RESULTS: Basic Metabolic Panel:  Recent Labs Lab 01/23/15 0844 01/24/15 0450  NA 138 139  K 3.9 3.3*  CL 99 100  CO2 30 30  GLUCOSE 230* 146*  BUN 7 12  CREATININE 0.57 0.60  CALCIUM 8.6 8.5   Liver Function Tests:  Recent Labs Lab 01/18/15 2115  AST 36  ALT 29  ALKPHOS 59  BILITOT 0.5  PROT 6.8  ALBUMIN 3.6   No results for input(s): LIPASE, AMYLASE in the last 168 hours. No results for input(s): AMMONIA in the last 168 hours. CBC:  Recent Labs Lab 01/18/15 2115  01/23/15 0844 01/24/15 0450  WBC 8.1  < > 5.7 9.2  NEUTROABS 7.7  --   --   --   HGB 10.0*  < > 8.7* 8.4*  HCT 30.9*  < > 26.6* 26.0*  MCV 88.8  < > 86.9 89.3  PLT 142*  < > 61* 78*  < > =  values in this interval not  displayed. Cardiac Enzymes: No results for input(s): CKTOTAL, CKMB, CKMBINDEX, TROPONINI in the last 168 hours. BNP: Invalid input(s): POCBNP CBG:  Recent Labs Lab 01/23/15 2128 01/24/15 0729  GLUCAP 372* 138*    Significant Diagnostic Studies:  Dg Chest 2 View  01/18/2015   CLINICAL DATA:  Fever and shortness breath for 1 day, chronic cough. History of diabetes, COPD and lung cancer.  EXAM: CHEST  2 VIEW  COMPARISON:  Chest radiograph December 05, 2013  FINDINGS: Cardiac silhouette is unremarkable. RIGHT upper lobe spiculated mass again seen with single-lumen RIGHT chest Port-A-Cath in place, distal tip at superior vena cava. Vascular stent in RIGHT lung. Mild chronic interstitial changes, similar without pleural effusion or focal consolidation, similar biapical pleural thickening. Flattened hemidiaphragms. No pneumothorax. Soft tissue planes and included osseous structures are nonsuspicious. Mild scoliosis.  IMPRESSION: Stable appearance of the chest, no acute cardiopulmonary process.  COPD, stable RIGHT upper lobe spiculated mass.   Electronically Signed   By: Elon Alas   On: 01/18/2015 22:49    2D ECHO:   Disposition and Follow-up:     Discharge Instructions    Diet Carb Modified    Complete by:  As directed      Increase activity slowly    Complete by:  As directed             DISPOSITION: Home with home health PT, OT, home RN.    DISCHARGE FOLLOW-UP Follow-up Information    Follow up with [facility], NP. Schedule an appointment as soon as possible for a visit in 10 days.   Specialty:  Nurse Practitioner   Why:  for hospital follow-up   Contact information:   [place] [place] [address] [phone]       Follow up with Eilleen Kempf., MD. Schedule an appointment as soon as possible for a visit in 10 days.   Specialty:  Oncology   Why:  for hospital follow-up   Contact information:   [place]  [id number] [phone]       Follow up with [facility].   Why:  Nurse, physical therapy, occupational therapy   Contact information:   [place] [address] [phone]        Time spent on Discharge: 35 minutes  Signed:   Mayetta Castleman M.D. [facility] 01/24/2015, 11:53 AM Pager: [phone]

## 2015-01-25 LAB — CULTURE, BLOOD (ROUTINE X 2)
Culture: NO GROWTH
Culture: NO GROWTH
Culture: NO GROWTH

## 2015-01-29 ENCOUNTER — Other Ambulatory Visit: Payer: Self-pay | Admitting: Internal Medicine

## 2015-01-30 ENCOUNTER — Other Ambulatory Visit (HOSPITAL_BASED_OUTPATIENT_CLINIC_OR_DEPARTMENT_OTHER): Payer: Medicare Other

## 2015-01-30 ENCOUNTER — Ambulatory Visit (HOSPITAL_BASED_OUTPATIENT_CLINIC_OR_DEPARTMENT_OTHER): Payer: Medicare Other

## 2015-01-30 ENCOUNTER — Encounter: Payer: Self-pay | Admitting: Physician Assistant

## 2015-01-30 ENCOUNTER — Ambulatory Visit (HOSPITAL_BASED_OUTPATIENT_CLINIC_OR_DEPARTMENT_OTHER): Payer: Medicare Other | Admitting: Physician Assistant

## 2015-01-30 VITALS — BP 130/64 | HR 110 | Temp 98.0°F | Resp 18 | Ht 67.0 in | Wt 168.1 lb

## 2015-01-30 DIAGNOSIS — Z5111 Encounter for antineoplastic chemotherapy: Secondary | ICD-10-CM

## 2015-01-30 DIAGNOSIS — C3411 Malignant neoplasm of upper lobe, right bronchus or lung: Secondary | ICD-10-CM

## 2015-01-30 DIAGNOSIS — I82622 Acute embolism and thrombosis of deep veins of left upper extremity: Secondary | ICD-10-CM

## 2015-01-30 LAB — CBC WITH DIFFERENTIAL/PLATELET
BASO%: 1 % (ref 0.0–2.0)
BASOS ABS: 0.1 10*3/uL (ref 0.0–0.1)
EOS%: 0.5 % (ref 0.0–7.0)
Eosinophils Absolute: 0.1 10*3/uL (ref 0.0–0.5)
HCT: 34.8 % (ref 34.8–46.6)
HEMOGLOBIN: 11.2 g/dL — AB (ref 11.6–15.9)
LYMPH#: 1.3 10*3/uL (ref 0.9–3.3)
LYMPH%: 11 % — ABNORMAL LOW (ref 14.0–49.7)
MCH: 28.1 pg (ref 25.1–34.0)
MCHC: 32.3 g/dL (ref 31.5–36.0)
MCV: 87.1 fL (ref 79.5–101.0)
MONO#: 1 10*3/uL — ABNORMAL HIGH (ref 0.1–0.9)
MONO%: 8.6 % (ref 0.0–14.0)
NEUT%: 78.9 % — ABNORMAL HIGH (ref 38.4–76.8)
NEUTROS ABS: 9.4 10*3/uL — AB (ref 1.5–6.5)
Platelets: 320 10*3/uL (ref 145–400)
RBC: 3.99 10*6/uL (ref 3.70–5.45)
RDW: 24.1 % — AB (ref 11.2–14.5)
WBC: 11.9 10*3/uL — AB (ref 3.9–10.3)

## 2015-01-30 LAB — COMPREHENSIVE METABOLIC PANEL (CC13)
ALT: 22 U/L (ref 0–55)
AST: 21 U/L (ref 5–34)
Albumin: 3.7 g/dL (ref 3.5–5.0)
Alkaline Phosphatase: 69 U/L (ref 40–150)
Anion Gap: 13 mEq/L — ABNORMAL HIGH (ref 3–11)
BILIRUBIN TOTAL: 0.47 mg/dL (ref 0.20–1.20)
BUN: 14 mg/dL (ref 7.0–26.0)
CALCIUM: 8.8 mg/dL (ref 8.4–10.4)
CO2: 27 mEq/L (ref 22–29)
CREATININE: 0.8 mg/dL (ref 0.6–1.1)
Chloride: 97 mEq/L — ABNORMAL LOW (ref 98–109)
EGFR: 80 mL/min/{1.73_m2} — ABNORMAL LOW (ref >90)
Glucose: 139 mg/dl (ref 70–140)
Potassium: 4.2 mEq/L (ref 3.5–5.1)
Sodium: 137 mEq/L (ref 136–145)
Total Protein: 7.1 g/dL (ref 6.4–8.3)

## 2015-01-30 MED ORDER — SODIUM CHLORIDE 0.9 % IV SOLN
Freq: Once | INTRAVENOUS | Status: AC
  Administered 2015-01-30: 15:00:00 via INTRAVENOUS

## 2015-01-30 MED ORDER — HEPARIN SOD (PORK) LOCK FLUSH 100 UNIT/ML IV SOLN
500.0000 [IU] | Freq: Once | INTRAVENOUS | Status: AC | PRN
  Administered 2015-01-30: 500 [IU]
  Filled 2015-01-30: qty 5

## 2015-01-30 MED ORDER — SODIUM CHLORIDE 0.9 % IJ SOLN
10.0000 mL | INTRAMUSCULAR | Status: DC | PRN
  Administered 2015-01-30: 10 mL
  Filled 2015-01-30: qty 10

## 2015-01-30 MED ORDER — SODIUM CHLORIDE 0.9 % IV SOLN
800.0000 mg/m2 | Freq: Once | INTRAVENOUS | Status: AC
  Administered 2015-01-30: 1558 mg via INTRAVENOUS
  Filled 2015-01-30: qty 40.98

## 2015-01-30 MED ORDER — PROCHLORPERAZINE EDISYLATE 5 MG/ML IJ SOLN
INTRAMUSCULAR | Status: AC
  Filled 2015-01-30: qty 2

## 2015-01-30 MED ORDER — PROCHLORPERAZINE EDISYLATE 5 MG/ML IJ SOLN
10.0000 mg | Freq: Once | INTRAMUSCULAR | Status: AC
  Administered 2015-01-30: 10 mg via INTRAVENOUS

## 2015-01-30 MED ORDER — OXYCODONE HCL 5 MG PO TABS: 5.0000 mg | ORAL_TABLET | ORAL | Status: DC | PRN

## 2015-01-30 NOTE — Patient Instructions (Signed)
[facility] Discharge Instructions for Patients Receiving Chemotherapy  Today you received the following chemotherapy agents Gemcitabine.   To help prevent nausea and vomiting after your treatment, we encourage you to take your nausea medication as directed.    If you develop nausea and vomiting that is not controlled by your nausea medication, call the clinic.   BELOW ARE SYMPTOMS THAT SHOULD BE REPORTED IMMEDIATELY:  *FEVER GREATER THAN 100.5 F  *CHILLS WITH OR WITHOUT FEVER  NAUSEA AND VOMITING THAT IS NOT CONTROLLED WITH YOUR NAUSEA MEDICATION  *UNUSUAL SHORTNESS OF BREATH  *UNUSUAL BRUISING OR BLEEDING  TENDERNESS IN MOUTH AND THROAT WITH OR WITHOUT PRESENCE OF ULCERS  *URINARY PROBLEMS  *BOWEL PROBLEMS  UNUSUAL RASH Items with * indicate a potential emergency and should be followed up as soon as possible.  Feel free to call the clinic you have any questions or concerns. The clinic phone number is [phone].  Please show the [facility] at check-in to the Emergency Department and triage nurse.

## 2015-01-30 NOTE — Progress Notes (Addendum)
[place] [phone]   [phone]  OFFICE PROGRESS NOTE  [name], NP [place] [address]  DIAGNOSIS: Recurrent non-small cell lung cancer, squamous cell carcinoma initially diagnosis stage IIIa (T1 N2 MX ) in August of 2010.   PRIOR THERAPY:  #1 status post concurrent chemoradiation with weekly carboplatin and paclitaxel, last dose of chemotherapy given 08/05/2011.  #2 status post consolidation chemotherapy with carboplatin paclitaxel last dose given 10/27/2009.  #3 Concurrent chemoradiation with weekly carboplatin for an AUC of 2 and paclitaxel at 45 mg per meter squared given concurrent with radiotherapy, last dose was given 09/20/2011.  #4 Systemic chemotherapy with gemcitabine 1000 mg meter squared on days 1 and 8 every 3 weeks status post 3 cycles with stable disease, last dose was given 12/20/2011.  #5 Systemic chemotherapy again with single agent gemcitabine 1000 mg/M2 on days 1 and 8 every 3 weeks. First cycle 11/07/2013. Status post 4 cycles. #6  Systemic chemotherapy again with single agent gemcitabine 1000 mg/M2 on days 1 and 8 every 3 weeks. First dose 07/24/2014. Discontinued today secondary to intolerance. #7  Immunotherapy with Nivolumab 3 MG/KG every 2 weeks. First dose 09/18/2014. She is status post 2 cycles. Last dose was given 10/02/2014 discontinued secondary to disease progression with significant SVC syndrome.  CURRENT THERAPY: Systemic chemotherapy with carboplatin for AUC of 5 on day 1 and gemcitabine 1000 MG/M2 on days 1 and 8 every 3 weeks. First dose 11/07/2014.  She had a reaction to the [place] skin test dose was cycle #2 carboplatin was discontinued. She proceeded with cycle #2 with gemcitabine only. Status post 4 cycles.  INTERVAL HISTORY: Janice Ball 59 y.o. female returns to the clinic today for a follow-up visit. She presents to proceed with day 1 of cycle 5 of her systemic  chemotherapy with gemcitabine. Carboplatin was discontinued with cycle #2 due to a positive skin test dose of carboplatin. She recently was positive for left upper extremity DVT and was started on Lovenox however she developed increased swelling and underwent thrombectomy on 12/01/2014. She has significantly decreased left upper extremity edema. She continues on Lovenox. She was discharged from the hospital on 01/24/2015. She was admitted for pneumonia. She completed her course of outpatient antibiotics. She is status post 4 cycles of chemotherapy and presents to proceed cycle #5. Today she is feeling well. She states her primary care physician wants her to have the "shingles vaccine". She requests refill prescriptions for her  oxycodone 5/325 mg. She reports no specific complaints today. She denied fever, chills, shortness of breath, cough or hemoptysis. Denied nausea, vomiting, diarrhea or constipation. She's had no significant weight loss or night sweats.  MEDICAL HISTORY: Past Medical History  Diagnosis Date  . Anxiety   . Hyperlipidemia   . Hypothyroidism   . COPD (chronic obstructive pulmonary disease)   . Arthritis     thumbs  . 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 Diabetes mellitus   . Lung cancer dx'd 05/2009  . Lung cancer dx'd 09/2013    recurrence in LN  . 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     ALLERGIES:  is allergic to carboplatin; sulfonamide derivatives; codeine; dilaudid; penicillins; and vicodin.  MEDICATIONS:  Current Outpatient Prescriptions  Medication Sig Dispense Refill  . acetaminophen (TYLENOL) 325 MG tablet Take 650 mg by mouth every 6 (six) hours as needed for mild pain or  headache.     . alendronate (FOSAMAX) 10 MG tablet Take 10 mg by mouth daily.     . budesonide-formoterol (SYMBICORT) 160-4.5 MCG/ACT inhaler Inhale 2 puffs into the lungs 2 (two) times daily.    . cholecalciferol (VITAMIN D) 1000  UNITS tablet Take 1,000 Units by mouth every morning.     . [employer] ASPIRIN ADULT LOW DOSE 81 MG chewable tablet Chew 81 mg by mouth daily.  0  . cyanocobalamin 1000 MCG tablet Take 1,000 mcg by mouth every morning.     . docusate sodium 100 MG CAPS Take 100 mg by mouth 2 (two) times daily. 10 capsule 0  . enoxaparin (LOVENOX) 120 MG/0.8ML injection Inject 0.8 mLs (120 mg total) into the skin daily. 30 Syringe 1  . glimepiride (AMARYL) 2 MG tablet Take 2 mg by mouth daily with breakfast.     . guaiFENesin (MUCINEX) 600 MG 12 hr tablet Take 1,200 mg by mouth 2 (two) times daily.    Marland Kitchen guaiFENesin (ROBITUSSIN) 100 MG/5ML SOLN Take 5-10 mLs (100-200 mg total) by mouth every 4 (four) hours as needed for cough. 120 mL 0  . ipratropium (ATROVENT) 0.02 % nebulizer solution Take 2.5 mLs (0.5 mg total) by nebulization 4 (four) times daily. 150 mL 4  . levofloxacin (LEVAQUIN) 750 MG tablet Take 1 tablet (750 mg total) by mouth daily. 5 tablet 0  . levothyroxine (SYNTHROID, LEVOTHROID) 137 MCG tablet Take 137 mcg by mouth daily.     Marland Kitchen lidocaine-prilocaine (EMLA) cream Apply 1 application topically as needed. Apply to port 1 hr before chemo 30 g 0  . LORazepam (ATIVAN) 1 MG tablet Take 1 mg by mouth 3 (three) times daily as needed for anxiety.     . magnesium gluconate (MAGONATE) 500 MG tablet Take 500 mg by mouth every morning.     . metFORMIN (GLUCOPHAGE) 1000 MG tablet Take 1,000 mg by mouth 2 (two) times daily with a meal.    . Multiple Vitamin (MULTIVITAMIN WITH MINERALS) TABS tablet Take 1 tablet by mouth every morning.    Marland Kitchen OVER THE COUNTER MEDICATION Take 1 capsule by mouth daily. MSM. For energy and joint pain    . oxyCODONE (OXY IR/ROXICODONE) 5 MG immediate release tablet Take 1 tablet (5 mg total) by mouth every 4 (four) hours as needed for moderate pain. 60 tablet 0  . predniSONE (DELTASONE) 10 MG tablet Prednisone dosing: Take  Prednisone 40mg  (4 tabs) x 3 days, then taper to 30mg  (3 tabs) x 3 days,  then 20mg  (2 tabs) x 3days, then 10mg  (1 tab) x 3days, then OFF.  Dispense:  30 tabs, refills: None 30 tablet 0  . PROAIR HFA 108 (90 BASE) MCG/ACT inhaler INHALE 2 PUFFS INTO THE LUNGS EVERY 6 (SIX) HOURS AS NEEDED FOR WHEEZING OR SHORTNESS OF BREATH. 8.5 each 2  . prochlorperazine (COMPAZINE) 10 MG tablet Take 1 tablet (10 mg total) by mouth every 6 (six) hours as needed for nausea or vomiting. 30 tablet 1  . saccharomyces boulardii (FLORASTOR) 250 MG capsule Take 1 capsule (250 mg total) by mouth 2 (two) times daily. 30 capsule 0  . sertraline (ZOLOFT) 100 MG tablet Take 150 mg by mouth at bedtime.     Marland Kitchen spironolactone (ALDACTONE) 25 MG tablet Take 25 mg by mouth every morning.      No current facility-administered medications for this visit.   Facility-Administered Medications Ordered in Other Visits  Medication Dose Route Frequency Provider Last Rate Last Dose  .  sodium chloride 0.9 % injection 10 mL  10 mL Intracatheter PRN Curt Bears, MD   10 mL at 11/07/14 1558    SURGICAL HISTORY:  Past Surgical History  Procedure Laterality Date  . Thyroidectomy, partial    . Tonsillectomy    . Tubal ligation    . Partial hysterectomy      REVIEW OF SYSTEMS:  Constitutional: positive for fatigue Eyes: negative Ears, nose, mouth, throat, and face: negative Respiratory: negative Cardiovascular: negative Gastrointestinal: negative Genitourinary:negative Integument/breast: negative Hematologic/lymphatic: negative Musculoskeletal:positive for Decreased swelling and warmth of the left upper extremity extending into the hand Neurological: negative Behavioral/Psych: negative Endocrine: negative Allergic/Immunologic: negative   PHYSICAL EXAMINATION: General appearance: alert, cooperative and no distress Head: Normocephalic, without obvious abnormality, atraumatic Neck: no adenopathy, no JVD, supple, symmetrical, trachea midline and thyroid not enlarged, symmetric, no  tenderness/mass/nodules Lymph nodes: Cervical, supraclavicular, and axillary nodes normal. Resp: wheezes bilaterally and Minimal Back: symmetric, no curvature. ROM normal. No CVA tenderness. Cardio: regular rate and rhythm, S1, S2 normal, no murmur, click, rub or gallop GI: soft, non-tender; bowel sounds normal; no masses,  no organomegaly Extremities: edema Trace to 1++ edema left upper extremity with decreased warmth and tenderness particularly in the upper biceps area Neurologic: Alert and oriented X 3, normal strength and tone. Normal symmetric reflexes. Normal coordination and gait  ECOG PERFORMANCE STATUS: 1 - Symptomatic but completely ambulatory  Blood pressure 130/64, pulse 110, temperature 98 F (36.7 C), temperature source Oral, resp. rate 18, height 5\' 7"  (1.702 m), weight 168 lb 1.6 oz (76.25 kg), SpO2 98 %.  LABORATORY DATA: Lab Results  Component Value Date   WBC 11.9* 01/30/2015   HGB 11.2* 01/30/2015   HCT 34.8 01/30/2015   MCV 87.1 01/30/2015   PLT 320 01/30/2015      Chemistry      Component Value Date/Time   NA 137 01/30/2015 1337   NA 139 01/24/2015 0450   NA 141 04/04/2012 0945   K 4.2 01/30/2015 1337   K 3.3* 01/24/2015 0450   K 4.7 04/04/2012 0945   CL 100 01/24/2015 0450   CL 101 04/06/2013 1041   CL 100 04/04/2012 0945   CO2 27 01/30/2015 1337   CO2 30 01/24/2015 0450   CO2 29 04/04/2012 0945   BUN 14.0 01/30/2015 1337   BUN 12 01/24/2015 0450   BUN 16 04/04/2012 0945   CREATININE 0.8 01/30/2015 1337   CREATININE 0.60 01/24/2015 0450   CREATININE 0.9 04/04/2012 0945      Component Value Date/Time   CALCIUM 8.8 01/30/2015 1337   CALCIUM 8.5 01/24/2015 0450   CALCIUM 8.4 04/04/2012 0945   ALKPHOS 69 01/30/2015 1337   ALKPHOS 59 01/18/2015 2115   ALKPHOS 61 04/04/2012 0945   AST 21 01/30/2015 1337   AST 36 01/18/2015 2115   AST 31 04/04/2012 0945   ALT 22 01/30/2015 1337   ALT 29 01/18/2015 2115   ALT 30 04/04/2012 0945   BILITOT 0.47  01/30/2015 1337   BILITOT 0.5 01/18/2015 2115   BILITOT 0.50 04/04/2012 0945       RADIOGRAPHIC STUDIES: Dg Chest 2 View  01/18/2015   CLINICAL DATA:  Fever and shortness breath for 1 day, chronic cough. History of diabetes, COPD and lung cancer.  EXAM: CHEST  2 VIEW  COMPARISON:  Chest radiograph December 05, 2013  FINDINGS: Cardiac silhouette is unremarkable. RIGHT upper lobe spiculated mass again seen with single-lumen RIGHT chest Port-A-Cath in place, distal tip at superior  vena cava. Vascular stent in RIGHT lung. Mild chronic interstitial changes, similar without pleural effusion or focal consolidation, similar biapical pleural thickening. Flattened hemidiaphragms. No pneumothorax. Soft tissue planes and included osseous structures are nonsuspicious. Mild scoliosis.  IMPRESSION: Stable appearance of the chest, no acute cardiopulmonary process.  COPD, stable RIGHT upper lobe spiculated mass.   Electronically Signed   By: Elon Alas   On: 01/18/2015 22:49   US Venous Img Upper Uni Left  01/07/2015   CLINICAL DATA:  Recurrent non-small cell lung carcinoma in the right upper lobe. SVC stent placed 10/17/2014. Patient presented with extensive left upper extremity DVT and underwent tPA assisted mechanical thrombectomy 11/21/2014. She returns for scheduled follow-up. Her left arm swelling has resolved and she is currently asymptomatic. She is currently on Lovenox , with plans to remain on lifelong anticoagulation.  EXAM: LEFT UPPER EXTREMITY VENOUS DOPPLER ULTRASOUND  TECHNIQUE: Gray-scale sonography with graded compression, as well as color Doppler and duplex ultrasound were performed to evaluate the upper extremity deep venous system from the level of the subclavian vein and including the jugular, axillary, basilic and upper cephalic vein. Spectral Doppler was utilized to evaluate flow at rest and with distal augmentation maneuvers.  COMPARISON:  12/01/2014 AND EARLIER STUDIES  FINDINGS: Thrombus  within deep veins:  None visualized.  Compressibility of deep veins:  Normal.  Duplex waveform respiratory phasicity:  Normal.  Duplex waveform response to augmentation:  Normal.  Venous reflux:  None visualized.  Other findings: Transmitted right atrial pulsations in the left ij and subclavian veins imply patency of the central venous structures.  IMPRESSION: 1. No residual or recurrent left upper extremity DVT.   Electronically Signed   By: Lucrezia Europe M.D.   On: 01/07/2015 14:09   ASSESSMENT AND PLAN: This is a very pleasant 59 years old white female with recurrent non-small cell lung cancer status post concurrent chemoradiation as well as consolidation chemotherapy and she is currently on systemic chemotherapy with single agent gemcitabine status post 4 cycles.  She was treated again with a course of systemic chemotherapy with single agent gemcitabine for 3 cycles but the patient has rough time tolerating her treatment with significant fatigue and weakness. The CT scan of the chest, abdomen and pelvis showed stable disease but the patient continues to have the persistent right upper lobe area of mass fibrosis and consolidation as well as persistent right hilar, subcarinal and right paratracheal lymphadenopathy as well as, one area of masslike soft tissue immediately anterior to the carina. She was started on treatment with Nivolumab status post 2 cycles. She tolerated the treatment well but she presented with evidence for disease progression in the chest. This treatment was discontinued. 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 Also the most recent CT angiogram of the chest showed further disease progression. She is currently being treated with systemic chemotherapy with carboplatin for AUC of 5 on day 1 and gemcitabine 1000 MG/M2 on days 1 and 8 every 3 weeks. Status post 4 cycles. Starting with cycle #2 she was treated with gemcitabine only due to a  positive carboplatin skin test. She will continue on Lovenox as previously prescribed for her left upper extremity DVT. She had a CT angiography of the chest recently we will postpone her restaging CT scan until after cycle #5. She'll proceed with cycle #5 consisting of gemcitabine only today as scheduled. She will follow-up in 3 weeks prior to the start of cycle #6. She  will be scheduled for restaging CT scans to re-evaluate her disease prior to her return in 3 weeks.  She was provided reveals for her compazine and oxycodone. She was advised to call immediately if she has any concerning symptoms in the interval.  All questions were answered. The patient knows to call the clinic with any problems, questions or concerns. We can certainly see the patient much sooner if necessary.  Carlton Adam, PA-C 01/30/2015  ADDENDUM: Hematology/Oncology Attending: I had a face to face encounter with the patient. I recommended her care plan. This is a very pleasant 59 years old white female with recurrent non-small cell lung cancer, squamous cell carcinoma who is currently undergoing systemic chemotherapy with single agent gemcitabine status post 4 cycles. She had hypersensitivity reaction to carboplatin and this was discontinued after cycle #2. The patient was recently admitted to [facility] with questionable pneumonia and treated with a course of antibiotics. She has intermittent hospitalization recently secondary to postobstructive pneumonia as well as pneumonitis from previous radiotherapy. I recommended for the patient to resume her systemic chemotherapy with gemcitabine as a scheduled. She will proceed with cycle #5 today as scheduled. The patient would come back for follow-up visit in 3 weeks with the next cycle of her treatment. She was advised to call immediately if she has any concerning symptoms in the interval.  Disclaimer: This note was dictated with voice recognition software. Similar  sounding words can inadvertently be transcribed and may not be corrected upon review. Eilleen Kempf., MD 02/02/2015

## 2015-01-31 ENCOUNTER — Telehealth: Payer: Self-pay | Admitting: Internal Medicine

## 2015-01-31 ENCOUNTER — Telehealth: Payer: Self-pay | Admitting: *Deleted

## 2015-01-31 NOTE — Telephone Encounter (Signed)
Confirmed appointment for May. Mailed calendar.

## 2015-01-31 NOTE — Telephone Encounter (Signed)
Per staff message and POF I have scheduled appts. Advised scheduler of appts. JMW  

## 2015-02-02 NOTE — Patient Instructions (Signed)
Continue labs and chemotherapy as scheduled Follow up in 3 weeks with restaging CT scans to re-evaluate your disease

## 2015-02-03 ENCOUNTER — Other Ambulatory Visit: Payer: Self-pay | Admitting: Physician Assistant

## 2015-02-03 DIAGNOSIS — C3411 Malignant neoplasm of upper lobe, right bronchus or lung: Secondary | ICD-10-CM

## 2015-02-04 ENCOUNTER — Other Ambulatory Visit: Payer: Self-pay | Admitting: Physician Assistant

## 2015-02-04 ENCOUNTER — Other Ambulatory Visit: Payer: Self-pay | Admitting: *Deleted

## 2015-02-06 ENCOUNTER — Ambulatory Visit (HOSPITAL_BASED_OUTPATIENT_CLINIC_OR_DEPARTMENT_OTHER): Payer: Medicare Other

## 2015-02-06 ENCOUNTER — Other Ambulatory Visit (HOSPITAL_BASED_OUTPATIENT_CLINIC_OR_DEPARTMENT_OTHER): Payer: Medicare Other

## 2015-02-06 VITALS — BP 128/76 | HR 110 | Temp 98.7°F | Resp 18

## 2015-02-06 DIAGNOSIS — C3411 Malignant neoplasm of upper lobe, right bronchus or lung: Secondary | ICD-10-CM | POA: Diagnosis not present

## 2015-02-06 DIAGNOSIS — R7309 Other abnormal glucose: Secondary | ICD-10-CM

## 2015-02-06 DIAGNOSIS — C3481 Malignant neoplasm of overlapping sites of right bronchus and lung: Secondary | ICD-10-CM

## 2015-02-06 DIAGNOSIS — Z5111 Encounter for antineoplastic chemotherapy: Secondary | ICD-10-CM

## 2015-02-06 LAB — CBC WITH DIFFERENTIAL/PLATELET
BASO%: 0.8 % (ref 0.0–2.0)
Basophils Absolute: 0.1 10*3/uL (ref 0.0–0.1)
EOS ABS: 0 10*3/uL (ref 0.0–0.5)
EOS%: 0.2 % (ref 0.0–7.0)
HCT: 33.3 % — ABNORMAL LOW (ref 34.8–46.6)
HGB: 10.7 g/dL — ABNORMAL LOW (ref 11.6–15.9)
LYMPH%: 13.1 % — ABNORMAL LOW (ref 14.0–49.7)
MCH: 28.9 pg (ref 25.1–34.0)
MCHC: 32.1 g/dL (ref 31.5–36.0)
MCV: 90 fL (ref 79.5–101.0)
MONO#: 0.8 10*3/uL (ref 0.1–0.9)
MONO%: 13.6 % (ref 0.0–14.0)
NEUT%: 72.3 % (ref 38.4–76.8)
NEUTROS ABS: 4.4 10*3/uL (ref 1.5–6.5)
Platelets: 197 10*3/uL (ref 145–400)
RBC: 3.7 10*6/uL (ref 3.70–5.45)
RDW: 19.9 % — AB (ref 11.2–14.5)
WBC: 6 10*3/uL (ref 3.9–10.3)
lymph#: 0.8 10*3/uL — ABNORMAL LOW (ref 0.9–3.3)

## 2015-02-06 LAB — COMPREHENSIVE METABOLIC PANEL (CC13)
ALK PHOS: 72 U/L (ref 40–150)
ALT: 23 U/L (ref 0–55)
ANION GAP: 16 meq/L — AB (ref 3–11)
AST: 18 U/L (ref 5–34)
Albumin: 3.4 g/dL — ABNORMAL LOW (ref 3.5–5.0)
BILIRUBIN TOTAL: 0.29 mg/dL (ref 0.20–1.20)
BUN: 7.7 mg/dL (ref 7.0–26.0)
CO2: 23 mEq/L (ref 22–29)
CREATININE: 0.8 mg/dL (ref 0.6–1.1)
Calcium: 8 mg/dL — ABNORMAL LOW (ref 8.4–10.4)
Chloride: 97 mEq/L — ABNORMAL LOW (ref 98–109)
EGFR: 82 mL/min/{1.73_m2} — AB (ref >90)
GLUCOSE: 359 mg/dL — AB (ref 70–140)
Potassium: 4.1 mEq/L (ref 3.5–5.1)
Sodium: 136 mEq/L (ref 136–145)
Total Protein: 6.5 g/dL (ref 6.4–8.3)

## 2015-02-06 LAB — TECHNOLOGIST REVIEW

## 2015-02-06 MED ORDER — INSULIN REGULAR HUMAN 100 UNIT/ML IJ SOLN
10.0000 [IU] | Freq: Once | INTRAMUSCULAR | Status: AC
  Administered 2015-02-06: 10 [IU] via SUBCUTANEOUS
  Filled 2015-02-06: qty 0.1

## 2015-02-06 MED ORDER — GEMCITABINE HCL CHEMO INJECTION 1 GM/26.3ML
800.0000 mg/m2 | Freq: Once | INTRAVENOUS | Status: AC
  Administered 2015-02-06: 1558 mg via INTRAVENOUS
  Filled 2015-02-06: qty 40.98

## 2015-02-06 MED ORDER — HEPARIN SOD (PORK) LOCK FLUSH 100 UNIT/ML IV SOLN
500.0000 [IU] | Freq: Once | INTRAVENOUS | Status: AC | PRN
  Administered 2015-02-06: 500 [IU]
  Filled 2015-02-06: qty 5

## 2015-02-06 MED ORDER — SODIUM CHLORIDE 0.9 % IJ SOLN
10.0000 mL | INTRAMUSCULAR | Status: DC | PRN
  Administered 2015-02-06: 10 mL
  Filled 2015-02-06: qty 10

## 2015-02-06 MED ORDER — SODIUM CHLORIDE 0.9 % IV SOLN
Freq: Once | INTRAVENOUS | Status: AC
  Administered 2015-02-06: 14:00:00 via INTRAVENOUS

## 2015-02-06 MED ORDER — PROCHLORPERAZINE MALEATE 10 MG PO TABS
10.0000 mg | ORAL_TABLET | Freq: Once | ORAL | Status: AC
Start: 2015-02-06 — End: 2015-02-06
  Administered 2015-02-06: 10 mg via ORAL

## 2015-02-06 MED ORDER — PROCHLORPERAZINE MALEATE 10 MG PO TABS
ORAL_TABLET | ORAL | Status: AC
  Filled 2015-02-06: qty 1

## 2015-02-06 NOTE — Patient Instructions (Signed)
[facility] Discharge Instructions for Patients Receiving Chemotherapy  Today you received the following chemotherapy agent: Gemzar   To help prevent nausea and vomiting after your treatment, we encourage you to take your nausea medication as prescribed.    If you develop nausea and vomiting that is not controlled by your nausea medication, call the clinic.   BELOW ARE SYMPTOMS THAT SHOULD BE REPORTED IMMEDIATELY:  *FEVER GREATER THAN 100.5 F  *CHILLS WITH OR WITHOUT FEVER  NAUSEA AND VOMITING THAT IS NOT CONTROLLED WITH YOUR NAUSEA MEDICATION  *UNUSUAL SHORTNESS OF BREATH  *UNUSUAL BRUISING OR BLEEDING  TENDERNESS IN MOUTH AND THROAT WITH OR WITHOUT PRESENCE OF ULCERS  *URINARY PROBLEMS  *BOWEL PROBLEMS  UNUSUAL RASH Items with * indicate a potential emergency and should be followed up as soon as possible.  Feel free to call the clinic you have any questions or concerns. The clinic phone number is [phone].  Please show the [facility] at check-in to the Emergency Department and triage nurse.

## 2015-02-06 NOTE — Progress Notes (Signed)
Dr. [name] notified of abnormal Cmet; glucose 359, calcium 8. Patient to receive 10 units regular insulin now, continue to take regular home medication, and f/u with PCP.

## 2015-02-17 ENCOUNTER — Encounter (HOSPITAL_COMMUNITY): Payer: Self-pay

## 2015-02-17 ENCOUNTER — Ambulatory Visit (HOSPITAL_COMMUNITY)
Admission: RE | Admit: 2015-02-17 | Discharge: 2015-02-17 | Disposition: A | Discharge status: Home / Self Care | Payer: Medicare Other | Source: Ambulatory Visit | Attending: Physician Assistant | Admitting: Physician Assistant

## 2015-02-17 DIAGNOSIS — J449 Chronic obstructive pulmonary disease, unspecified: Secondary | ICD-10-CM | POA: Insufficient documentation

## 2015-02-17 DIAGNOSIS — C3411 Malignant neoplasm of upper lobe, right bronchus or lung: Secondary | ICD-10-CM | POA: Insufficient documentation

## 2015-02-17 MED ORDER — IOHEXOL 300 MG/ML  SOLN
100.0000 mL | Freq: Once | INTRAMUSCULAR | Status: AC | PRN
  Administered 2015-02-17: 100 mL via INTRAVENOUS

## 2015-02-20 ENCOUNTER — Other Ambulatory Visit (HOSPITAL_BASED_OUTPATIENT_CLINIC_OR_DEPARTMENT_OTHER): Payer: Medicare Other

## 2015-02-20 ENCOUNTER — Ambulatory Visit (HOSPITAL_BASED_OUTPATIENT_CLINIC_OR_DEPARTMENT_OTHER): Payer: Medicare Other | Admitting: Internal Medicine

## 2015-02-20 ENCOUNTER — Telehealth: Payer: Self-pay | Admitting: Internal Medicine

## 2015-02-20 ENCOUNTER — Encounter: Payer: Self-pay | Admitting: Internal Medicine

## 2015-02-20 ENCOUNTER — Ambulatory Visit (HOSPITAL_BASED_OUTPATIENT_CLINIC_OR_DEPARTMENT_OTHER): Payer: Medicare Other

## 2015-02-20 VITALS — BP 124/62 | HR 111 | Temp 98.2°F | Resp 17 | Ht 67.0 in | Wt 170.2 lb

## 2015-02-20 DIAGNOSIS — C3411 Malignant neoplasm of upper lobe, right bronchus or lung: Secondary | ICD-10-CM

## 2015-02-20 DIAGNOSIS — Z86718 Personal history of other venous thrombosis and embolism: Secondary | ICD-10-CM | POA: Diagnosis not present

## 2015-02-20 DIAGNOSIS — Z5111 Encounter for antineoplastic chemotherapy: Secondary | ICD-10-CM

## 2015-02-20 DIAGNOSIS — C3481 Malignant neoplasm of overlapping sites of right bronchus and lung: Secondary | ICD-10-CM

## 2015-02-20 LAB — CBC WITH DIFFERENTIAL/PLATELET
BASO%: 0.4 % (ref 0.0–2.0)
Basophils Absolute: 0 10*3/uL (ref 0.0–0.1)
EOS%: 2.5 % (ref 0.0–7.0)
Eosinophils Absolute: 0.1 10*3/uL (ref 0.0–0.5)
HCT: 33.8 % — ABNORMAL LOW (ref 34.8–46.6)
HGB: 10.7 g/dL — ABNORMAL LOW (ref 11.6–15.9)
LYMPH#: 0.8 10*3/uL — AB (ref 0.9–3.3)
LYMPH%: 16.8 % (ref 14.0–49.7)
MCH: 28.8 pg (ref 25.1–34.0)
MCHC: 31.7 g/dL (ref 31.5–36.0)
MCV: 90.9 fL (ref 79.5–101.0)
MONO#: 0.5 10*3/uL (ref 0.1–0.9)
MONO%: 9.9 % (ref 0.0–14.0)
NEUT#: 3.3 10*3/uL (ref 1.5–6.5)
NEUT%: 70.4 % (ref 38.4–76.8)
PLATELETS: 178 10*3/uL (ref 145–400)
RBC: 3.72 10*6/uL (ref 3.70–5.45)
RDW: 19.2 % — AB (ref 11.2–14.5)
WBC: 4.8 10*3/uL (ref 3.9–10.3)

## 2015-02-20 LAB — COMPREHENSIVE METABOLIC PANEL (CC13)
ALK PHOS: 72 U/L (ref 40–150)
ALT: 15 U/L (ref 0–55)
AST: 18 U/L (ref 5–34)
Albumin: 3.3 g/dL — ABNORMAL LOW (ref 3.5–5.0)
Anion Gap: 15 mEq/L — ABNORMAL HIGH (ref 3–11)
BILIRUBIN TOTAL: 0.3 mg/dL (ref 0.20–1.20)
BUN: 5.2 mg/dL — AB (ref 7.0–26.0)
CO2: 19 mEq/L — ABNORMAL LOW (ref 22–29)
CREATININE: 0.7 mg/dL (ref 0.6–1.1)
Calcium: 8.1 mg/dL — ABNORMAL LOW (ref 8.4–10.4)
Chloride: 104 mEq/L (ref 98–109)
GLUCOSE: 218 mg/dL — AB (ref 70–140)
Potassium: 4 mEq/L (ref 3.5–5.1)
Sodium: 139 mEq/L (ref 136–145)
Total Protein: 6.4 g/dL (ref 6.4–8.3)

## 2015-02-20 MED ORDER — PROCHLORPERAZINE MALEATE 10 MG PO TABS
ORAL_TABLET | ORAL | Status: AC
  Filled 2015-02-20: qty 1

## 2015-02-20 MED ORDER — PROCHLORPERAZINE EDISYLATE 5 MG/ML IJ SOLN
10.0000 mg | Freq: Once | INTRAMUSCULAR | Status: AC
  Administered 2015-02-20: 10 mg via INTRAVENOUS

## 2015-02-20 MED ORDER — PROCHLORPERAZINE MALEATE 10 MG PO TABS: 10.0000 mg | ORAL_TABLET | Freq: Four times a day (QID) | ORAL | Status: DC | PRN

## 2015-02-20 MED ORDER — HEPARIN SOD (PORK) LOCK FLUSH 100 UNIT/ML IV SOLN
500.0000 [IU] | Freq: Once | INTRAVENOUS | Status: AC | PRN
  Administered 2015-02-20: 500 [IU]
  Filled 2015-02-20: qty 5

## 2015-02-20 MED ORDER — PROCHLORPERAZINE EDISYLATE 5 MG/ML IJ SOLN
INTRAMUSCULAR | Status: AC
  Filled 2015-02-20: qty 2

## 2015-02-20 MED ORDER — SODIUM CHLORIDE 0.9 % IV SOLN
Freq: Once | INTRAVENOUS | Status: AC
  Administered 2015-02-20: 12:00:00 via INTRAVENOUS

## 2015-02-20 MED ORDER — ENOXAPARIN SODIUM 120 MG/0.8ML ~~LOC~~ SOLN: 120.0000 mg | Freq: Every day | SUBCUTANEOUS | Status: DC

## 2015-02-20 MED ORDER — SODIUM CHLORIDE 0.9 % IV SOLN
800.0000 mg/m2 | Freq: Once | INTRAVENOUS | Status: AC
  Administered 2015-02-20: 1558 mg via INTRAVENOUS
  Filled 2015-02-20: qty 40.98

## 2015-02-20 MED ORDER — OXYCODONE HCL 5 MG PO TABS: 5.0000 mg | ORAL_TABLET | ORAL | Status: DC | PRN

## 2015-02-20 MED ORDER — SODIUM CHLORIDE 0.9 % IJ SOLN
10.0000 mL | INTRAMUSCULAR | Status: DC | PRN
  Administered 2015-02-20: 10 mL
  Filled 2015-02-20: qty 10

## 2015-02-20 NOTE — Telephone Encounter (Signed)
Added appts...the patient will get new sched in chemo

## 2015-02-20 NOTE — Patient Instructions (Signed)
[facility] Discharge Instructions for Patients Receiving Chemotherapy  Today you received the following chemotherapy agent: Gemzar   To help prevent nausea and vomiting after your treatment, we encourage you to take your nausea medication as prescribed.    If you develop nausea and vomiting that is not controlled by your nausea medication, call the clinic.   BELOW ARE SYMPTOMS THAT SHOULD BE REPORTED IMMEDIATELY:  *FEVER GREATER THAN 100.5 F  *CHILLS WITH OR WITHOUT FEVER  NAUSEA AND VOMITING THAT IS NOT CONTROLLED WITH YOUR NAUSEA MEDICATION  *UNUSUAL SHORTNESS OF BREATH  *UNUSUAL BRUISING OR BLEEDING  TENDERNESS IN MOUTH AND THROAT WITH OR WITHOUT PRESENCE OF ULCERS  *URINARY PROBLEMS  *BOWEL PROBLEMS  UNUSUAL RASH Items with * indicate a potential emergency and should be followed up as soon as possible.  Feel free to call the clinic you have any questions or concerns. The clinic phone number is [phone].  Please show the [facility] at check-in to the Emergency Department and triage nurse.

## 2015-02-20 NOTE — Progress Notes (Signed)
[facility] [phone]   [phone]  OFFICE PROGRESS NOTE  [name], NP [place] [address]  DIAGNOSIS: Recurrent non-small cell lung cancer, squamous cell carcinoma initially diagnosis stage IIIA (T1 N2 MX ) in August of 2010.   PRIOR THERAPY:  #1 status post concurrent chemoradiation with weekly carboplatin and paclitaxel, last dose of chemotherapy given 08/05/2011.  #2 status post consolidation chemotherapy with carboplatin paclitaxel last dose given 10/27/2009.  #3 Concurrent chemoradiation with weekly carboplatin for an AUC of 2 and paclitaxel at 45 mg per meter squared given concurrent with radiotherapy, last dose was given 09/20/2011.  #4 Systemic chemotherapy with gemcitabine 1000 mg meter squared on days 1 and 8 every 3 weeks status post 3 cycles with stable disease, last dose was given 12/20/2011.  #5 Systemic chemotherapy again with single agent gemcitabine 1000 mg/M2 on days 1 and 8 every 3 weeks. First cycle 11/07/2013. Status post 4 cycles. #6  Systemic chemotherapy again with single agent gemcitabine 1000 mg/M2 on days 1 and 8 every 3 weeks. First dose 07/24/2014. Discontinued today secondary to intolerance. #7  Immunotherapy with Nivolumab 3 MG/KG every 2 weeks. First dose 09/18/2014. She is status post 2 cycles. Last dose was given 10/02/2014 discontinued secondary to disease progression with significant SVC syndrome.  CURRENT THERAPY: Systemic chemotherapy with gemcitabine 800 MG/M2 on days 1 and 8 every 3 weeks. First dose 11/07/2014. Status post 5 cycles.  INTERVAL HISTORY: Janice Ball 59 y.o. female returns to the clinic today for follow-up visit. The patient is feeling fine today with no specific complaints except for mild shortness of breath. She is tolerating her current treatment with single agent gemcitabine fairly well with no significant adverse effects. She denied having any significant  chest pain but has mild cough with no hemoptysis. She denied having any significant weight loss or night sweats. She denied having any nausea or vomiting. She has no fever or chills. She had repeat CT scan of the chest, abdomen and pelvis performed recently and she is here for evaluation and discussion of her scan results.   MEDICAL HISTORY: Past Medical History  Diagnosis Date  . Anxiety   . Hyperlipidemia   . Hypothyroidism   . COPD (chronic obstructive pulmonary disease)   . Arthritis     thumbs  . 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 Lung cancer dx'd 05/2009  . Lung cancer dx'd 09/2013    recurrence in LN  . Diabetes mellitus     ALLERGIES:  is allergic to carboplatin; sulfonamide derivatives; codeine; dilaudid; penicillins; and vicodin.  MEDICATIONS:  Current Outpatient Prescriptions  Medication Sig Dispense Refill  . acetaminophen (TYLENOL) 325 MG tablet Take 650 mg by mouth every 6 (six) hours as needed for mild pain or headache.     . alendronate (FOSAMAX) 10 MG tablet Take 10 mg by mouth daily.     . budesonide-formoterol (SYMBICORT) 160-4.5 MCG/ACT inhaler Inhale 2 puffs into the lungs 2 (two) times daily.    . cholecalciferol (VITAMIN D) 1000 UNITS tablet Take 1,000 Units by mouth every morning.     . [facility] ASPIRIN ADULT LOW DOSE 81 MG chewable tablet Chew 81 mg by mouth daily.  0  . cyanocobalamin 1000 MCG tablet Take 1,000 mcg by mouth every morning.     . enoxaparin (LOVENOX) 120 MG/0.8ML injection  Inject 0.8 mLs (120 mg total) into the skin daily. 30 Syringe 1  . glimepiride (AMARYL) 2 MG tablet Take 2 mg by mouth daily with breakfast.     . guaiFENesin (ROBITUSSIN) 100 MG/5ML SOLN Take 5-10 mLs (100-200 mg total) by mouth every 4 (four) hours as needed for cough. 120 mL 0  . ipratropium (ATROVENT) 0.02 % nebulizer solution Take 2.5 mLs (0.5 mg total) by nebulization 4  (four) times daily. 150 mL 4  . levothyroxine (SYNTHROID, LEVOTHROID) 137 MCG tablet Take 137 mcg by mouth daily.     Marland Kitchen lidocaine-prilocaine (EMLA) cream Apply 1 application topically as needed. Apply to port 1 hr before chemo 30 g 0  . LORazepam (ATIVAN) 1 MG tablet Take 1 mg by mouth 3 (three) times daily as needed for anxiety.     . magnesium gluconate (MAGONATE) 500 MG tablet Take 500 mg by mouth every morning.     . metFORMIN (GLUCOPHAGE) 1000 MG tablet Take 1,000 mg by mouth 2 (two) times daily with a meal.    . Multiple Vitamin (MULTIVITAMIN WITH MINERALS) TABS tablet Take 1 tablet by mouth every morning.    Marland Kitchen OVER THE COUNTER MEDICATION Take 1 capsule by mouth daily. MSM. For energy and joint pain    . oxyCODONE (OXY IR/ROXICODONE) 5 MG immediate release tablet Take 1 tablet (5 mg total) by mouth every 4 (four) hours as needed for moderate pain. 60 tablet 0  . PROAIR HFA 108 (90 BASE) MCG/ACT inhaler INHALE 2 PUFFS INTO THE LUNGS EVERY 6 (SIX) HOURS AS NEEDED FOR WHEEZING OR SHORTNESS OF BREATH. 8.5 each 2  . prochlorperazine (COMPAZINE) 10 MG tablet Take 1 tablet (10 mg total) by mouth every 6 (six) hours as needed for nausea or vomiting. 30 tablet 1  . sertraline (ZOLOFT) 100 MG tablet Take 150 mg by mouth at bedtime.     Marland Kitchen spironolactone (ALDACTONE) 25 MG tablet Take 25 mg by mouth every morning.      No current facility-administered medications for this visit.   Facility-Administered Medications Ordered in Other Visits  Medication Dose Route Frequency Provider Last Rate Last Dose  . sodium chloride 0.9 % injection 10 mL  10 mL Intracatheter PRN Curt Bears, MD   10 mL at 11/07/14 1558    SURGICAL HISTORY:  Past Surgical History  Procedure Laterality Date  . Thyroidectomy, partial    . Tonsillectomy    . Tubal ligation    . Partial hysterectomy      REVIEW OF SYSTEMS:  Constitutional: positive for fatigue Eyes: negative Ears, nose, mouth, throat, and face:  negative Respiratory: positive for cough and dyspnea on exertion Cardiovascular: negative Gastrointestinal: negative Genitourinary:negative Integument/breast: negative Hematologic/lymphatic: negative Musculoskeletal:negative Neurological: negative Behavioral/Psych: negative Endocrine: negative Allergic/Immunologic: negative   PHYSICAL EXAMINATION: General appearance: alert, cooperative, no distress and Swelling of her face and puffiness of her eye Head: Normocephalic, without obvious abnormality, atraumatic Neck: no adenopathy, no JVD, supple, symmetrical, trachea midline and thyroid not enlarged, symmetric, no tenderness/mass/nodules Lymph nodes: Cervical, supraclavicular, and axillary nodes normal. Resp: wheezes bilaterally Back: symmetric, no curvature. ROM normal. No CVA tenderness. Cardio: regular rate and rhythm, S1, S2 normal, no murmur, click, rub or gallop GI: soft, non-tender; bowel sounds normal; no masses,  no organomegaly Extremities: extremities normal, atraumatic, no cyanosis or edema Neurologic: Alert and oriented X 3, normal strength and tone. Normal symmetric reflexes. Normal coordination and gait  ECOG PERFORMANCE STATUS: 1 - Symptomatic but completely ambulatory  Blood pressure  124/62, pulse 111, temperature 98.2 F (36.8 C), temperature source Oral, resp. rate 17, height '5\' 7"'$  (1.702 m), weight 170 lb 3.2 oz (77.202 kg).  LABORATORY DATA: Lab Results  Component Value Date   WBC 4.8 02/20/2015   HGB 10.7* 02/20/2015   HCT 33.8* 02/20/2015   MCV 90.9 02/20/2015   PLT 178 02/20/2015      Chemistry      Component Value Date/Time   NA 136 02/06/2015 1319   NA 139 01/24/2015 0450   NA 141 04/04/2012 0945   K 4.1 02/06/2015 1319   K 3.3* 01/24/2015 0450   K 4.7 04/04/2012 0945   CL 100 01/24/2015 0450   CL 101 04/06/2013 1041   CL 100 04/04/2012 0945   CO2 23 02/06/2015 1319   CO2 30 01/24/2015 0450   CO2 29 04/04/2012 0945   BUN 7.7 02/06/2015 1319    BUN 12 01/24/2015 0450   BUN 16 04/04/2012 0945   CREATININE 0.8 02/06/2015 1319   CREATININE 0.60 01/24/2015 0450   CREATININE 0.9 04/04/2012 0945      Component Value Date/Time   CALCIUM 8.0* 02/06/2015 1319   CALCIUM 8.5 01/24/2015 0450   CALCIUM 8.4 04/04/2012 0945   ALKPHOS 72 02/06/2015 1319   ALKPHOS 59 01/18/2015 2115   ALKPHOS 61 04/04/2012 0945   AST 18 02/06/2015 1319   AST 36 01/18/2015 2115   AST 31 04/04/2012 0945   ALT 23 02/06/2015 1319   ALT 29 01/18/2015 2115   ALT 30 04/04/2012 0945   BILITOT 0.29 02/06/2015 1319   BILITOT 0.5 01/18/2015 2115   BILITOT 0.50 04/04/2012 0945       RADIOGRAPHIC STUDIES: Ct Chest W Contrast  02/17/2015   CLINICAL DATA:  Right upper lobe lung cancer.  COPD.  EXAM: CT CHEST, ABDOMEN, AND PELVIS WITH CONTRAST  TECHNIQUE: Multidetector CT imaging of the chest, abdomen and pelvis was performed following the standard protocol during bolus administration of intravenous contrast.  CONTRAST:  117m OMNIPAQUE IOHEXOL 300 MG/ML  SOLN  COMPARISON:  Multiple exams, including 11/30/2014 hand 09/03/2014  FINDINGS: CT CHEST FINDINGS  Mediastinum/Nodes: Mediastinal in confluent right paramediastinal density surrounding the right tracheobronchial tree with the mediastinal soft tissue density surrounding the lower trachea, carina, and proximal left mainstem bronchus. Rim calcified right paratracheal lymph node is obscured by the surrounding soft tissue density except for its rim calcification. Overall the degree of soft tissue infiltration of the mediastinum is similar to the prior exam, but the SVC now appears to be patent status post thrombectomy.  Atherosclerotic aortic arch and branch vessels. Coronary artery atherosclerotic calcification. Wall thickening in the mid thoracic esophagus, stable.  Lungs/Pleura: The amount of density in the superior segment right upper lobe confluent with the right hilar and suprahilar density is reduced. This is probably  due to improved radiation pneumonitis. As noted above, density surrounds the right upper tracheobronchial tree, similar to prior, and probably from radiation pneumonitis although a component of residual tumor in this vicinity is certainly not excluded. Mild attenuation of the right upper lobe pulmonary artery in the vicinity of the right suprahilar soft tissue prominence.  Slight nodularity in the left upper lobe is stable from 2012 and considered benign.  Musculoskeletal: Thoracic scoliosis. Healing right lateral rib fractures.  CT ABDOMEN PELVIS FINDINGS  Hepatobiliary: Diffuse hepatic steatosis. Indistinct dependent density in the gallbladder could be from sludge or gallstones. No biliary dilatation.  Pancreas: Unremarkable  Spleen: Punctate calcifications in the spleen compatible with  remote granulomatous disease  Adrenals/Urinary Tract: Unremarkable  Stomach/Bowel: Prominent stool throughout the colon favors constipation.  Vascular/Lymphatic: Aortoiliac atherosclerotic vascular disease.  Reproductive: Uterus absent.  Ovaries unremarkable.  Other: No supplemental non-categorized findings.  Musculoskeletal: Unremarkable  IMPRESSION: 1. Mostly stable appearance of the confluent mediastinal and right perihilar/suprahilar soft tissue density much of which probably represents radiation pneumonitis or radiation related fibrosis. There is slightly reduced density in the superior segment right upper lobe as noted above. No findings of metastatic spread. 2. Thickened mid thoracic esophagus possibly reflecting esophagitis. Radiation related esophagitis not excluded. 3. Atherosclerosis. 4. Several late subacute healing right rib fractures. 5. Diffuse hepatic steatosis. 6. Sludge or gallstones in the gallbladder. 7.  Prominent stool throughout the colon favors constipation.   Electronically Signed   By: Van Clines M.D.   On: 02/17/2015 09:40   Ct Abdomen Pelvis W Contrast  02/17/2015   CLINICAL DATA:  Right upper  lobe lung cancer.  COPD.  EXAM: CT CHEST, ABDOMEN, AND PELVIS WITH CONTRAST  TECHNIQUE: Multidetector CT imaging of the chest, abdomen and pelvis was performed following the standard protocol during bolus administration of intravenous contrast.  CONTRAST:  164m OMNIPAQUE IOHEXOL 300 MG/ML  SOLN  COMPARISON:  Multiple exams, including 11/30/2014 hand 09/03/2014  FINDINGS: CT CHEST FINDINGS  Mediastinum/Nodes: Mediastinal in confluent right paramediastinal density surrounding the right tracheobronchial tree with the mediastinal soft tissue density surrounding the lower trachea, carina, and proximal left mainstem bronchus. Rim calcified right paratracheal lymph node is obscured by the surrounding soft tissue density except for its rim calcification. Overall the degree of soft tissue infiltration of the mediastinum is similar to the prior exam, but the SVC now appears to be patent status post thrombectomy.  Atherosclerotic aortic arch and branch vessels. Coronary artery atherosclerotic calcification. Wall thickening in the mid thoracic esophagus, stable.  Lungs/Pleura: The amount of density in the superior segment right upper lobe confluent with the right hilar and suprahilar density is reduced. This is probably due to improved radiation pneumonitis. As noted above, density surrounds the right upper tracheobronchial tree, similar to prior, and probably from radiation pneumonitis although a component of residual tumor in this vicinity is certainly not excluded. Mild attenuation of the right upper lobe pulmonary artery in the vicinity of the right suprahilar soft tissue prominence.  Slight nodularity in the left upper lobe is stable from 2012 and considered benign.  Musculoskeletal: Thoracic scoliosis. Healing right lateral rib fractures.  CT ABDOMEN PELVIS FINDINGS  Hepatobiliary: Diffuse hepatic steatosis. Indistinct dependent density in the gallbladder could be from sludge or gallstones. No biliary dilatation.   Pancreas: Unremarkable  Spleen: Punctate calcifications in the spleen compatible with remote granulomatous disease  Adrenals/Urinary Tract: Unremarkable  Stomach/Bowel: Prominent stool throughout the colon favors constipation.  Vascular/Lymphatic: Aortoiliac atherosclerotic vascular disease.  Reproductive: Uterus absent.  Ovaries unremarkable.  Other: No supplemental non-categorized findings.  Musculoskeletal: Unremarkable  IMPRESSION: 1. Mostly stable appearance of the confluent mediastinal and right perihilar/suprahilar soft tissue density much of which probably represents radiation pneumonitis or radiation related fibrosis. There is slightly reduced density in the superior segment right upper lobe as noted above. No findings of metastatic spread. 2. Thickened mid thoracic esophagus possibly reflecting esophagitis. Radiation related esophagitis not excluded. 3. Atherosclerosis. 4. Several late subacute healing right rib fractures. 5. Diffuse hepatic steatosis. 6. Sludge or gallstones in the gallbladder. 7.  Prominent stool throughout the colon favors constipation.   Electronically Signed   By: WCindra EvesD.  On: 02/17/2015 09:40   ASSESSMENT AND PLAN: This is a very pleasant 59 years old white female with recurrent non-small cell lung cancer status post concurrent chemoradiation as well as consolidation chemotherapy and she is currently on systemic chemotherapy with single agent gemcitabine status post 4 cycles.  She was treated again with a course of systemic chemotherapy with single agent gemcitabine for 3 cycles but the patient has rough time tolerating her treatment with significant fatigue and weakness. The recent CT scan of the chest, abdomen and pelvis showed stable disease but the patient continues to have the persistent right upper lobe area of mass fibrosis and consolidation as well as persistent right hilar, subcarinal and right paratracheal lymphadenopathy as well as, one area of  masslike soft tissue immediately anterior to the carina. She was started on treatment with Nivolumab status post 2 cycles. She tolerated the treatment well but she presented with evidence for disease progression in the chest. This treatment was discontinued. 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 Further imaging studies showed evidence for disease progression. The patient was restarted on systemic chemotherapy initially with carboplatin and gemcitabine body carboplatin was discontinued secondary to hypersensitivity reaction. She continued on treatment with single agent gemcitabine with reduced dose 800 MG/M2 on days 1 and 8 every 3 weeks status post 5 cycles. She is tolerating her treatment fairly well with no significant adverse effects. The recent CT scan of the chest, abdomen and pelvis showed stable disease. I discussed the scan results with the patient today. I recommended for her to continue with the same regimen of gemcitabine 800 MG/M2 on days 1 and 8 every 3 weeks as a scheduled. She will start cycle #6 today. The patient would come back for follow-up visit in 3 weeks with the start of cycle #7. For the history of deep venous thrombosis she will continue on Lovenox subcutaneously on daily basis. I gave the patient a refill of oxycodone as well as Compazine today. She was advised to call immediately if she has any concerning symptoms in the interval.  All questions were answered. The patient knows to call the clinic with any problems, questions or concerns. We can certainly see the patient much sooner if necessary.   Disclaimer: This note was dictated with voice recognition software. Similar sounding words can inadvertently be transcribed and may not be corrected upon review.

## 2015-02-27 ENCOUNTER — Telehealth: Payer: Self-pay | Admitting: Internal Medicine

## 2015-02-27 ENCOUNTER — Ambulatory Visit (HOSPITAL_BASED_OUTPATIENT_CLINIC_OR_DEPARTMENT_OTHER): Payer: Medicare Other

## 2015-02-27 ENCOUNTER — Other Ambulatory Visit (HOSPITAL_BASED_OUTPATIENT_CLINIC_OR_DEPARTMENT_OTHER): Payer: Medicare Other

## 2015-02-27 VITALS — BP 116/65 | HR 106 | Temp 98.4°F | Resp 18

## 2015-02-27 DIAGNOSIS — C3411 Malignant neoplasm of upper lobe, right bronchus or lung: Secondary | ICD-10-CM

## 2015-02-27 DIAGNOSIS — Z5111 Encounter for antineoplastic chemotherapy: Secondary | ICD-10-CM | POA: Diagnosis not present

## 2015-02-27 LAB — CBC WITH DIFFERENTIAL/PLATELET
BASO%: 0.9 % (ref 0.0–2.0)
Basophils Absolute: 0 10*3/uL (ref 0.0–0.1)
EOS ABS: 0 10*3/uL (ref 0.0–0.5)
EOS%: 0.6 % (ref 0.0–7.0)
HCT: 31.3 % — ABNORMAL LOW (ref 34.8–46.6)
HGB: 10.2 g/dL — ABNORMAL LOW (ref 11.6–15.9)
LYMPH%: 20.6 % (ref 14.0–49.7)
MCH: 29.6 pg (ref 25.1–34.0)
MCHC: 32.6 g/dL (ref 31.5–36.0)
MCV: 90.7 fL (ref 79.5–101.0)
MONO#: 0.4 10*3/uL (ref 0.1–0.9)
MONO%: 12.2 % (ref 0.0–14.0)
NEUT%: 65.7 % (ref 38.4–76.8)
NEUTROS ABS: 2.2 10*3/uL (ref 1.5–6.5)
Platelets: 132 10*3/uL — ABNORMAL LOW (ref 145–400)
RBC: 3.45 10*6/uL — AB (ref 3.70–5.45)
RDW: 18.2 % — ABNORMAL HIGH (ref 11.2–14.5)
WBC: 3.4 10*3/uL — ABNORMAL LOW (ref 3.9–10.3)
lymph#: 0.7 10*3/uL — ABNORMAL LOW (ref 0.9–3.3)

## 2015-02-27 LAB — COMPREHENSIVE METABOLIC PANEL (CC13)
ALBUMIN: 3.1 g/dL — AB (ref 3.5–5.0)
ALT: 31 U/L (ref 0–55)
AST: 33 U/L (ref 5–34)
Alkaline Phosphatase: 79 U/L (ref 40–150)
Anion Gap: 13 mEq/L — ABNORMAL HIGH (ref 3–11)
BILIRUBIN TOTAL: 0.24 mg/dL (ref 0.20–1.20)
BUN: 6.7 mg/dL — ABNORMAL LOW (ref 7.0–26.0)
CALCIUM: 8.3 mg/dL — AB (ref 8.4–10.4)
CO2: 23 mEq/L (ref 22–29)
Chloride: 102 mEq/L (ref 98–109)
Creatinine: 0.8 mg/dL (ref 0.6–1.1)
EGFR: 80 mL/min/{1.73_m2} — ABNORMAL LOW (ref >90)
Glucose: 260 mg/dl — ABNORMAL HIGH (ref 70–140)
Potassium: 4.1 mEq/L (ref 3.5–5.1)
SODIUM: 138 meq/L (ref 136–145)
TOTAL PROTEIN: 6.4 g/dL (ref 6.4–8.3)

## 2015-02-27 MED ORDER — SODIUM CHLORIDE 0.9 % IJ SOLN
10.0000 mL | INTRAMUSCULAR | Status: DC | PRN
Start: 2015-02-27 — End: 2015-02-27
  Administered 2015-02-27: 10 mL
  Filled 2015-02-27: qty 10

## 2015-02-27 MED ORDER — SODIUM CHLORIDE 0.9 % IV SOLN
800.0000 mg/m2 | Freq: Once | INTRAVENOUS | Status: AC
  Administered 2015-02-27: 1558 mg via INTRAVENOUS
  Filled 2015-02-27: qty 40.98

## 2015-02-27 MED ORDER — HEPARIN SOD (PORK) LOCK FLUSH 100 UNIT/ML IV SOLN
500.0000 [IU] | Freq: Once | INTRAVENOUS | Status: AC | PRN
  Administered 2015-02-27: 500 [IU]
  Filled 2015-02-27: qty 5

## 2015-02-27 MED ORDER — SODIUM CHLORIDE 0.9 % IV SOLN
Freq: Once | INTRAVENOUS | Status: AC
  Administered 2015-02-27: 14:00:00 via INTRAVENOUS

## 2015-02-27 MED ORDER — PROCHLORPERAZINE MALEATE 10 MG PO TABS
ORAL_TABLET | ORAL | Status: AC
  Filled 2015-02-27: qty 1

## 2015-02-27 MED ORDER — PROCHLORPERAZINE MALEATE 10 MG PO TABS
10.0000 mg | ORAL_TABLET | Freq: Once | ORAL | Status: AC
  Administered 2015-02-27: 10 mg via ORAL

## 2015-02-27 NOTE — Telephone Encounter (Signed)
returned call and lvm for pt confirming todays appt

## 2015-02-27 NOTE — Patient Instructions (Signed)
[facility] Discharge Instructions for Patients Receiving Chemotherapy  Today you received the following chemotherapy agent: Gemzar   To help prevent nausea and vomiting after your treatment, we encourage you to take your nausea medication as prescribed.    If you develop nausea and vomiting that is not controlled by your nausea medication, call the clinic.   BELOW ARE SYMPTOMS THAT SHOULD BE REPORTED IMMEDIATELY:  *FEVER GREATER THAN 100.5 F  *CHILLS WITH OR WITHOUT FEVER  NAUSEA AND VOMITING THAT IS NOT CONTROLLED WITH YOUR NAUSEA MEDICATION  *UNUSUAL SHORTNESS OF BREATH  *UNUSUAL BRUISING OR BLEEDING  TENDERNESS IN MOUTH AND THROAT WITH OR WITHOUT PRESENCE OF ULCERS  *URINARY PROBLEMS  *BOWEL PROBLEMS  UNUSUAL RASH Items with * indicate a potential emergency and should be followed up as soon as possible.  Feel free to call the clinic you have any questions or concerns. The clinic phone number is [phone].  Please show the [facility] at check-in to the Emergency Department and triage nurse.

## 2015-03-06 ENCOUNTER — Other Ambulatory Visit (HOSPITAL_BASED_OUTPATIENT_CLINIC_OR_DEPARTMENT_OTHER): Payer: Medicare Other

## 2015-03-06 DIAGNOSIS — C3481 Malignant neoplasm of overlapping sites of right bronchus and lung: Secondary | ICD-10-CM

## 2015-03-06 DIAGNOSIS — C3411 Malignant neoplasm of upper lobe, right bronchus or lung: Secondary | ICD-10-CM | POA: Diagnosis not present

## 2015-03-06 LAB — COMPREHENSIVE METABOLIC PANEL (CC13)
ALK PHOS: 83 U/L (ref 40–150)
ALT: 21 U/L (ref 0–55)
AST: 24 U/L (ref 5–34)
Albumin: 3.1 g/dL — ABNORMAL LOW (ref 3.5–5.0)
Anion Gap: 13 mEq/L — ABNORMAL HIGH (ref 3–11)
BUN: 6 mg/dL — AB (ref 7.0–26.0)
CALCIUM: 8.3 mg/dL — AB (ref 8.4–10.4)
CHLORIDE: 98 meq/L (ref 98–109)
CO2: 24 mEq/L (ref 22–29)
Creatinine: 0.7 mg/dL (ref 0.6–1.1)
EGFR: 88 mL/min/{1.73_m2} — ABNORMAL LOW (ref >90)
Glucose: 232 mg/dl — ABNORMAL HIGH (ref 70–140)
POTASSIUM: 4.4 meq/L (ref 3.5–5.1)
SODIUM: 135 meq/L — AB (ref 136–145)
TOTAL PROTEIN: 6.6 g/dL (ref 6.4–8.3)
Total Bilirubin: 0.29 mg/dL (ref 0.20–1.20)

## 2015-03-06 LAB — CBC WITH DIFFERENTIAL/PLATELET
BASO%: 0.3 % (ref 0.0–2.0)
Basophils Absolute: 0 10*3/uL (ref 0.0–0.1)
EOS%: 0.2 % (ref 0.0–7.0)
Eosinophils Absolute: 0 10*3/uL (ref 0.0–0.5)
HEMATOCRIT: 30.9 % — AB (ref 34.8–46.6)
HEMOGLOBIN: 10 g/dL — AB (ref 11.6–15.9)
LYMPH#: 0.6 10*3/uL — AB (ref 0.9–3.3)
LYMPH%: 10.9 % — ABNORMAL LOW (ref 14.0–49.7)
MCH: 29.3 pg (ref 25.1–34.0)
MCHC: 32.4 g/dL (ref 31.5–36.0)
MCV: 90.6 fL (ref 79.5–101.0)
MONO#: 0.6 10*3/uL (ref 0.1–0.9)
MONO%: 10.8 % (ref 0.0–14.0)
NEUT%: 77.8 % — ABNORMAL HIGH (ref 38.4–76.8)
NEUTROS ABS: 4.6 10*3/uL (ref 1.5–6.5)
Platelets: 81 10*3/uL — ABNORMAL LOW (ref 145–400)
RBC: 3.41 10*6/uL — ABNORMAL LOW (ref 3.70–5.45)
RDW: 18.7 % — AB (ref 11.2–14.5)
WBC: 5.9 10*3/uL (ref 3.9–10.3)
nRBC: 1 % — ABNORMAL HIGH (ref 0–0)

## 2015-03-06 LAB — TECHNOLOGIST REVIEW

## 2015-03-13 ENCOUNTER — Other Ambulatory Visit (HOSPITAL_BASED_OUTPATIENT_CLINIC_OR_DEPARTMENT_OTHER): Payer: Medicare Other

## 2015-03-13 ENCOUNTER — Ambulatory Visit (HOSPITAL_BASED_OUTPATIENT_CLINIC_OR_DEPARTMENT_OTHER): Payer: Medicare Other | Admitting: Physician Assistant

## 2015-03-13 ENCOUNTER — Ambulatory Visit (HOSPITAL_BASED_OUTPATIENT_CLINIC_OR_DEPARTMENT_OTHER): Payer: Medicare Other

## 2015-03-13 ENCOUNTER — Encounter: Payer: Self-pay | Admitting: Physician Assistant

## 2015-03-13 VITALS — BP 135/65 | HR 111 | Temp 98.1°F | Resp 18 | Ht 67.0 in | Wt 174.4 lb

## 2015-03-13 VITALS — HR 88

## 2015-03-13 DIAGNOSIS — Z86718 Personal history of other venous thrombosis and embolism: Secondary | ICD-10-CM | POA: Diagnosis not present

## 2015-03-13 DIAGNOSIS — Z5111 Encounter for antineoplastic chemotherapy: Secondary | ICD-10-CM | POA: Diagnosis not present

## 2015-03-13 DIAGNOSIS — C3411 Malignant neoplasm of upper lobe, right bronchus or lung: Secondary | ICD-10-CM | POA: Diagnosis not present

## 2015-03-13 DIAGNOSIS — J449 Chronic obstructive pulmonary disease, unspecified: Secondary | ICD-10-CM

## 2015-03-13 LAB — COMPREHENSIVE METABOLIC PANEL (CC13)
ALBUMIN: 3 g/dL — AB (ref 3.5–5.0)
ALK PHOS: 76 U/L (ref 40–150)
ALT: 9 U/L (ref 0–55)
AST: 18 U/L (ref 5–34)
Anion Gap: 11 mEq/L (ref 3–11)
BUN: 8 mg/dL (ref 7.0–26.0)
CO2: 24 mEq/L (ref 22–29)
CREATININE: 0.7 mg/dL (ref 0.6–1.1)
Calcium: 8.1 mg/dL — ABNORMAL LOW (ref 8.4–10.4)
Chloride: 103 mEq/L (ref 98–109)
GLUCOSE: 183 mg/dL — AB (ref 70–140)
Potassium: 4.4 mEq/L (ref 3.5–5.1)
Sodium: 138 mEq/L (ref 136–145)
Total Bilirubin: 0.26 mg/dL (ref 0.20–1.20)
Total Protein: 6.4 g/dL (ref 6.4–8.3)

## 2015-03-13 LAB — CBC WITH DIFFERENTIAL/PLATELET
BASO%: 1.1 % (ref 0.0–2.0)
Basophils Absolute: 0.1 10e3/uL (ref 0.0–0.1)
EOS%: 1.8 % (ref 0.0–7.0)
Eosinophils Absolute: 0.1 10e3/uL (ref 0.0–0.5)
HCT: 30.7 % — ABNORMAL LOW (ref 34.8–46.6)
HGB: 9.9 g/dL — ABNORMAL LOW (ref 11.6–15.9)
LYMPH%: 11.2 % — ABNORMAL LOW (ref 14.0–49.7)
MCH: 29.4 pg (ref 25.1–34.0)
MCHC: 32.4 g/dL (ref 31.5–36.0)
MCV: 90.8 fL (ref 79.5–101.0)
MONO#: 0.7 10e3/uL (ref 0.1–0.9)
MONO%: 10.4 % (ref 0.0–14.0)
NEUT#: 5 10e3/uL (ref 1.5–6.5)
NEUT%: 75.5 % (ref 38.4–76.8)
Platelets: 276 10e3/uL (ref 145–400)
RBC: 3.38 10e6/uL — ABNORMAL LOW (ref 3.70–5.45)
RDW: 20.8 % — ABNORMAL HIGH (ref 11.2–14.5)
WBC: 6.6 10e3/uL (ref 3.9–10.3)
lymph#: 0.7 10e3/uL — ABNORMAL LOW (ref 0.9–3.3)

## 2015-03-13 MED ORDER — GEMCITABINE HCL CHEMO INJECTION 1 GM/26.3ML
800.0000 mg/m2 | Freq: Once | INTRAVENOUS | Status: AC
  Administered 2015-03-13: 1558 mg via INTRAVENOUS
  Filled 2015-03-13: qty 40.98

## 2015-03-13 MED ORDER — HEPARIN SOD (PORK) LOCK FLUSH 100 UNIT/ML IV SOLN
500.0000 [IU] | Freq: Once | INTRAVENOUS | Status: AC | PRN
  Administered 2015-03-13: 500 [IU]
  Filled 2015-03-13: qty 5

## 2015-03-13 MED ORDER — PROCHLORPERAZINE EDISYLATE 5 MG/ML IJ SOLN
INTRAMUSCULAR | Status: AC
Start: 2015-03-13 — End: 2015-03-13
  Filled 2015-03-13: qty 2

## 2015-03-13 MED ORDER — SODIUM CHLORIDE 0.9 % IV SOLN
Freq: Once | INTRAVENOUS | Status: AC
  Administered 2015-03-13: 15:00:00 via INTRAVENOUS

## 2015-03-13 MED ORDER — PROCHLORPERAZINE EDISYLATE 5 MG/ML IJ SOLN
10.0000 mg | Freq: Once | INTRAMUSCULAR | Status: AC
  Administered 2015-03-13: 10 mg via INTRAVENOUS

## 2015-03-13 MED ORDER — SODIUM CHLORIDE 0.9 % IJ SOLN
10.0000 mL | INTRAMUSCULAR | Status: DC | PRN
  Administered 2015-03-13: 10 mL
  Filled 2015-03-13: qty 10

## 2015-03-13 NOTE — Patient Instructions (Signed)
[facility] Discharge Instructions for Patients Receiving Chemotherapy  Today you received the following chemotherapy agent: Gemzar   To help prevent nausea and vomiting after your treatment, we encourage you to take your nausea medication as prescribed.    If you develop nausea and vomiting that is not controlled by your nausea medication, call the clinic.   BELOW ARE SYMPTOMS THAT SHOULD BE REPORTED IMMEDIATELY:  *FEVER GREATER THAN 100.5 F  *CHILLS WITH OR WITHOUT FEVER  NAUSEA AND VOMITING THAT IS NOT CONTROLLED WITH YOUR NAUSEA MEDICATION  *UNUSUAL SHORTNESS OF BREATH  *UNUSUAL BRUISING OR BLEEDING  TENDERNESS IN MOUTH AND THROAT WITH OR WITHOUT PRESENCE OF ULCERS  *URINARY PROBLEMS  *BOWEL PROBLEMS  UNUSUAL RASH Items with * indicate a potential emergency and should be followed up as soon as possible.  Feel free to call the clinic you have any questions or concerns. The clinic phone number is [phone].  Please show the [facility] at check-in to the Emergency Department and triage nurse.

## 2015-03-13 NOTE — Progress Notes (Addendum)
[place] [phone]   [phone]  OFFICE PROGRESS NOTE  [name], NP [place] [address]  DIAGNOSIS: Recurrent non-small cell lung cancer, squamous cell carcinoma initially diagnosis stage IIIA (T1 N2 MX ) in August of 2010.   PRIOR THERAPY:  #1 status post concurrent chemoradiation with weekly carboplatin and paclitaxel, last dose of chemotherapy given 08/05/2011.  #2 status post consolidation chemotherapy with carboplatin paclitaxel last dose given 10/27/2009.  #3 Concurrent chemoradiation with weekly carboplatin for an AUC of 2 and paclitaxel at 45 mg per meter squared given concurrent with radiotherapy, last dose was given 09/20/2011.  #4 Systemic chemotherapy with gemcitabine 1000 mg meter squared on days 1 and 8 every 3 weeks status post 3 cycles with stable disease, last dose was given 12/20/2011.  #5 Systemic chemotherapy again with single agent gemcitabine 1000 mg/M2 on days 1 and 8 every 3 weeks. First cycle 11/07/2013. Status post 4 cycles. #6  Systemic chemotherapy again with single agent gemcitabine 1000 mg/M2 on days 1 and 8 every 3 weeks. First dose 07/24/2014. Discontinued today secondary to intolerance. #7  Immunotherapy with Nivolumab 3 MG/KG every 2 weeks. First dose 09/18/2014. She is status post 2 cycles. Last dose was given 10/02/2014 discontinued secondary to disease progression with significant SVC syndrome.  CURRENT THERAPY: Systemic chemotherapy with gemcitabine 800 MG/M2 on days 1 and 8 every 3 weeks. First dose 11/07/2014. Status post 6 cycles.  INTERVAL HISTORY: Janice Ball 59 y.o. female returns to the clinic today for follow-up visit. The patient is feeling fine today with no specific complaints except for mild shortness of breath. She is tolerating her current treatment with single agent gemcitabine fairly well with no significant adverse effects. She is currently being treated  For  asthmatic bronchitis with a 10 day course of Avelox as well as her inhalers and nebulizer treatments. She denied having any significant chest pain but has mild cough with no hemoptysis. She denied having any significant weight loss or night sweats. She denied having any nausea or vomiting. She has chills but no fever.  MEDICAL HISTORY: Past Medical History  Diagnosis Date  . Anxiety   . Hyperlipidemia   . Hypothyroidism   . COPD (chronic obstructive pulmonary disease)   . Arthritis     thumbs  . 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 Lung cancer dx'd 05/2009  . Lung cancer dx'd 09/2013    recurrence in LN  . Diabetes mellitus     ALLERGIES:  is allergic to carboplatin; sulfonamide derivatives; codeine; dilaudid; penicillins; and vicodin.  MEDICATIONS:  Current Outpatient Prescriptions  Medication Sig Dispense Refill  . acetaminophen (TYLENOL) 325 MG tablet Take 650 mg by mouth every 6 (six) hours as needed for mild pain or headache.     . alendronate (FOSAMAX) 10 MG tablet Take 10 mg by mouth daily.     . budesonide-formoterol (SYMBICORT) 160-4.5 MCG/ACT inhaler Inhale 2 puffs into the lungs 2 (two) times daily.    . cholecalciferol (VITAMIN D) 1000 UNITS tablet Take 1,000 Units by mouth every morning.     . [facility] ASPIRIN ADULT LOW DOSE 81 MG chewable tablet Chew 81 mg by mouth daily.  0  . cyanocobalamin 1000 MCG tablet Take 1,000 mcg by mouth every morning.     . enoxaparin (LOVENOX) 120 MG/0.8ML injection Inject 0.8  mLs (120 mg total) into the skin daily. 30 Syringe 1  . glimepiride (AMARYL) 2 MG tablet Take 2 mg by mouth daily with breakfast.     . guaiFENesin (ROBITUSSIN) 100 MG/5ML SOLN Take 5-10 mLs (100-200 mg total) by mouth every 4 (four) hours as needed for cough. 120 mL 0  . ipratropium (ATROVENT) 0.02 % nebulizer solution Take 2.5 mLs (0.5 mg total) by nebulization 4 (four)  times daily. 150 mL 4  . levothyroxine (SYNTHROID, LEVOTHROID) 137 MCG tablet Take 137 mcg by mouth daily.     Marland Kitchen lidocaine-prilocaine (EMLA) cream Apply 1 application topically as needed. Apply to port 1 hr before chemo 30 g 0  . LORazepam (ATIVAN) 1 MG tablet Take 1 mg by mouth 3 (three) times daily as needed for anxiety.     . magnesium gluconate (MAGONATE) 500 MG tablet Take 500 mg by mouth every morning.     . metFORMIN (GLUCOPHAGE) 1000 MG tablet Take 1,000 mg by mouth 2 (two) times daily with a meal.    . Multiple Vitamin (MULTIVITAMIN WITH MINERALS) TABS tablet Take 1 tablet by mouth every morning.    Marland Kitchen OVER THE COUNTER MEDICATION Take 1 capsule by mouth daily. MSM. For energy and joint pain    . oxyCODONE (OXY IR/ROXICODONE) 5 MG immediate release tablet Take 1 tablet (5 mg total) by mouth every 4 (four) hours as needed for moderate pain. 60 tablet 0  . PROAIR HFA 108 (90 BASE) MCG/ACT inhaler INHALE 2 PUFFS INTO THE LUNGS EVERY 6 (SIX) HOURS AS NEEDED FOR WHEEZING OR SHORTNESS OF BREATH. 8.5 each 2  . prochlorperazine (COMPAZINE) 10 MG tablet Take 1 tablet (10 mg total) by mouth every 6 (six) hours as needed for nausea or vomiting. 30 tablet 1  . sertraline (ZOLOFT) 100 MG tablet Take 150 mg by mouth at bedtime.     Marland Kitchen spironolactone (ALDACTONE) 25 MG tablet Take 25 mg by mouth every morning.      No current facility-administered medications for this visit.   Facility-Administered Medications Ordered in Other Visits  Medication Dose Route Frequency Provider Last Rate Last Dose  . sodium chloride 0.9 % injection 10 mL  10 mL Intracatheter PRN Curt Bears, MD   10 mL at 11/07/14 1558    SURGICAL HISTORY:  Past Surgical History  Procedure Laterality Date  . Thyroidectomy, partial    . Tonsillectomy    . Tubal ligation    . Partial hysterectomy      REVIEW OF SYSTEMS:  Constitutional: positive for fatigue Eyes: negative Ears, nose, mouth, throat, and face:  negative Respiratory: positive for cough and dyspnea on exertion Cardiovascular: negative Gastrointestinal: negative Genitourinary:negative Integument/breast: negative Hematologic/lymphatic: negative Musculoskeletal:negative Neurological: negative Behavioral/Psych: negative Endocrine: negative Allergic/Immunologic: negative   PHYSICAL EXAMINATION: General appearance: alert, cooperative, no distress and Swelling of her face and puffiness of her eye Head: Normocephalic, without obvious abnormality, atraumatic Neck: no adenopathy, no JVD, supple, symmetrical, trachea midline and thyroid not enlarged, symmetric, no tenderness/mass/nodules Lymph nodes: Cervical, supraclavicular, and axillary nodes normal. Resp: wheezes bilaterally Back: symmetric, no curvature. ROM normal. No CVA tenderness. Cardio: regular rate and rhythm, S1, S2 normal, no murmur, click, rub or gallop GI: soft, non-tender; bowel sounds normal; no masses,  no organomegaly Extremities: extremities normal, atraumatic, no cyanosis or edema Neurologic: Alert and oriented X 3, normal strength and tone. Normal symmetric reflexes. Normal coordination and gait  ECOG PERFORMANCE STATUS: 1 - Symptomatic but completely ambulatory  Blood pressure 135/65, pulse  111, temperature 98.1 F (36.7 C), temperature source Oral, resp. rate 18, height '5\' 7"'$  (1.702 m), weight 174 lb 6.4 oz (79.107 kg), SpO2 98 %.  LABORATORY DATA: Lab Results  Component Value Date   WBC 6.6 03/13/2015   HGB 9.9* 03/13/2015   HCT 30.7* 03/13/2015   MCV 90.8 03/13/2015   PLT 276 03/13/2015      Chemistry      Component Value Date/Time   NA 138 03/13/2015 1328   NA 139 01/24/2015 0450   NA 141 04/04/2012 0945   K 4.4 03/13/2015 1328   K 3.3* 01/24/2015 0450   K 4.7 04/04/2012 0945   CL 100 01/24/2015 0450   CL 101 04/06/2013 1041   CL 100 04/04/2012 0945   CO2 24 03/13/2015 1328   CO2 30 01/24/2015 0450   CO2 29 04/04/2012 0945   BUN 8.0  03/13/2015 1328   BUN 12 01/24/2015 0450   BUN 16 04/04/2012 0945   CREATININE 0.7 03/13/2015 1328   CREATININE 0.60 01/24/2015 0450   CREATININE 0.9 04/04/2012 0945      Component Value Date/Time   CALCIUM 8.1* 03/13/2015 1328   CALCIUM 8.5 01/24/2015 0450   CALCIUM 8.4 04/04/2012 0945   ALKPHOS 76 03/13/2015 1328   ALKPHOS 59 01/18/2015 2115   ALKPHOS 61 04/04/2012 0945   AST 18 03/13/2015 1328   AST 36 01/18/2015 2115   AST 31 04/04/2012 0945   ALT 9 03/13/2015 1328   ALT 29 01/18/2015 2115   ALT 30 04/04/2012 0945   BILITOT 0.26 03/13/2015 1328   BILITOT 0.5 01/18/2015 2115   BILITOT 0.50 04/04/2012 0945       RADIOGRAPHIC STUDIES: Ct Chest W Contrast  02/17/2015   CLINICAL DATA:  Right upper lobe lung cancer.  COPD.  EXAM: CT CHEST, ABDOMEN, AND PELVIS WITH CONTRAST  TECHNIQUE: Multidetector CT imaging of the chest, abdomen and pelvis was performed following the standard protocol during bolus administration of intravenous contrast.  CONTRAST:  180m OMNIPAQUE IOHEXOL 300 MG/ML  SOLN  COMPARISON:  Multiple exams, including 11/30/2014 hand 09/03/2014  FINDINGS: CT CHEST FINDINGS  Mediastinum/Nodes: Mediastinal in confluent right paramediastinal density surrounding the right tracheobronchial tree with the mediastinal soft tissue density surrounding the lower trachea, carina, and proximal left mainstem bronchus. Rim calcified right paratracheal lymph node is obscured by the surrounding soft tissue density except for its rim calcification. Overall the degree of soft tissue infiltration of the mediastinum is similar to the prior exam, but the SVC now appears to be patent status post thrombectomy.  Atherosclerotic aortic arch and branch vessels. Coronary artery atherosclerotic calcification. Wall thickening in the mid thoracic esophagus, stable.  Lungs/Pleura: The amount of density in the superior segment right upper lobe confluent with the right hilar and suprahilar density is reduced.  This is probably due to improved radiation pneumonitis. As noted above, density surrounds the right upper tracheobronchial tree, similar to prior, and probably from radiation pneumonitis although a component of residual tumor in this vicinity is certainly not excluded. Mild attenuation of the right upper lobe pulmonary artery in the vicinity of the right suprahilar soft tissue prominence.  Slight nodularity in the left upper lobe is stable from 2012 and considered benign.  Musculoskeletal: Thoracic scoliosis. Healing right lateral rib fractures.  CT ABDOMEN PELVIS FINDINGS  Hepatobiliary: Diffuse hepatic steatosis. Indistinct dependent density in the gallbladder could be from sludge or gallstones. No biliary dilatation.  Pancreas: Unremarkable  Spleen: Punctate calcifications in the spleen compatible  with remote granulomatous disease  Adrenals/Urinary Tract: Unremarkable  Stomach/Bowel: Prominent stool throughout the colon favors constipation.  Vascular/Lymphatic: Aortoiliac atherosclerotic vascular disease.  Reproductive: Uterus absent.  Ovaries unremarkable.  Other: No supplemental non-categorized findings.  Musculoskeletal: Unremarkable  IMPRESSION: 1. Mostly stable appearance of the confluent mediastinal and right perihilar/suprahilar soft tissue density much of which probably represents radiation pneumonitis or radiation related fibrosis. There is slightly reduced density in the superior segment right upper lobe as noted above. No findings of metastatic spread. 2. Thickened mid thoracic esophagus possibly reflecting esophagitis. Radiation related esophagitis not excluded. 3. Atherosclerosis. 4. Several late subacute healing right rib fractures. 5. Diffuse hepatic steatosis. 6. Sludge or gallstones in the gallbladder. 7.  Prominent stool throughout the colon favors constipation.   Electronically Signed   By: Van Clines M.D.   On: 02/17/2015 09:40   Ct Abdomen Pelvis W Contrast  02/17/2015   CLINICAL  DATA:  Right upper lobe lung cancer.  COPD.  EXAM: CT CHEST, ABDOMEN, AND PELVIS WITH CONTRAST  TECHNIQUE: Multidetector CT imaging of the chest, abdomen and pelvis was performed following the standard protocol during bolus administration of intravenous contrast.  CONTRAST:  14m OMNIPAQUE IOHEXOL 300 MG/ML  SOLN  COMPARISON:  Multiple exams, including 11/30/2014 hand 09/03/2014  FINDINGS: CT CHEST FINDINGS  Mediastinum/Nodes: Mediastinal in confluent right paramediastinal density surrounding the right tracheobronchial tree with the mediastinal soft tissue density surrounding the lower trachea, carina, and proximal left mainstem bronchus. Rim calcified right paratracheal lymph node is obscured by the surrounding soft tissue density except for its rim calcification. Overall the degree of soft tissue infiltration of the mediastinum is similar to the prior exam, but the SVC now appears to be patent status post thrombectomy.  Atherosclerotic aortic arch and branch vessels. Coronary artery atherosclerotic calcification. Wall thickening in the mid thoracic esophagus, stable.  Lungs/Pleura: The amount of density in the superior segment right upper lobe confluent with the right hilar and suprahilar density is reduced. This is probably due to improved radiation pneumonitis. As noted above, density surrounds the right upper tracheobronchial tree, similar to prior, and probably from radiation pneumonitis although a component of residual tumor in this vicinity is certainly not excluded. Mild attenuation of the right upper lobe pulmonary artery in the vicinity of the right suprahilar soft tissue prominence.  Slight nodularity in the left upper lobe is stable from 2012 and considered benign.  Musculoskeletal: Thoracic scoliosis. Healing right lateral rib fractures.  CT ABDOMEN PELVIS FINDINGS  Hepatobiliary: Diffuse hepatic steatosis. Indistinct dependent density in the gallbladder could be from sludge or gallstones. No biliary  dilatation.  Pancreas: Unremarkable  Spleen: Punctate calcifications in the spleen compatible with remote granulomatous disease  Adrenals/Urinary Tract: Unremarkable  Stomach/Bowel: Prominent stool throughout the colon favors constipation.  Vascular/Lymphatic: Aortoiliac atherosclerotic vascular disease.  Reproductive: Uterus absent.  Ovaries unremarkable.  Other: No supplemental non-categorized findings.  Musculoskeletal: Unremarkable  IMPRESSION: 1. Mostly stable appearance of the confluent mediastinal and right perihilar/suprahilar soft tissue density much of which probably represents radiation pneumonitis or radiation related fibrosis. There is slightly reduced density in the superior segment right upper lobe as noted above. No findings of metastatic spread. 2. Thickened mid thoracic esophagus possibly reflecting esophagitis. Radiation related esophagitis not excluded. 3. Atherosclerosis. 4. Several late subacute healing right rib fractures. 5. Diffuse hepatic steatosis. 6. Sludge or gallstones in the gallbladder. 7.  Prominent stool throughout the colon favors constipation.   Electronically Signed   By: WCindra EvesD.  On: 02/17/2015 09:40   ASSESSMENT AND PLAN: This is a very pleasant 59 years old white female with recurrent non-small cell lung cancer status post concurrent chemoradiation as well as consolidation chemotherapy and she is currently on systemic chemotherapy with single agent gemcitabine status post 4 cycles.  She was treated again with a course of systemic chemotherapy with single agent gemcitabine for 3 cycles but the patient has rough time tolerating her treatment with significant fatigue and weakness. The recent CT scan of the chest, abdomen and pelvis showed stable disease but the patient continues to have the persistent right upper lobe area of mass fibrosis and consolidation as well as persistent right hilar, subcarinal and right paratracheal lymphadenopathy as well as, one  area of masslike soft tissue immediately anterior to the carina. She was started on treatment with Nivolumab status post 2 cycles. She tolerated the treatment well but she presented with evidence for disease progression in the chest. This treatment was discontinued. 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 Further imaging studies showed evidence for disease progression. The patient was restarted on systemic chemotherapy initially with carboplatin and gemcitabine body carboplatin was discontinued secondary to hypersensitivity reaction. She continued on treatment with single agent gemcitabine with reduced dose 800 MG/M2 on days 1 and 8 every 3 weeks status post 6 cycles. She is tolerating her treatment fairly well with no significant adverse effects. The recent CT scan of the chest, abdomen and pelvis showed stable disease. The patient was discussed with and also seen by Dr. [name].She will continue with the same regimen of gemcitabine 800 MG/M2 on days 1 and 8 every 3 weeks as a scheduled. She will start cycle #7 today. The patient would come back for follow-up visit in 3 weeks with the start of cycle #8. For the history of deep venous thrombosis she will continue on Lovenox subcutaneously on daily basis.  She was advised to call immediately if she has any concerning symptoms in the interval.  All questions were answered. The patient knows to call the clinic with any problems, questions or concerns. We can certainly see the patient much sooner if necessary.  Carlton Adam, PA-C 03/13/2015  ADDENDUM: Hematology/Oncology Attending: I had a face to face encounter with the patient. I recommended her care plan. This is a very pleasant 59 years old white female with recurrent non-small cell lung cancer and currently undergoing systemic treatment with reduced dose gemcitabine 800 MG/M2 on days 1 and 8 every 3 weeks. She status post 6 cycles of  treatment. The patient is rating her treatment fairly well with no significant adverse effects except for mild fatigue. The recent CT scan of the chest, abdomen and pelvis showed no significant evidence for disease progression. I discussed the scan results with the patient. I recommended for her to continue her current treatment with gemcitabine as a scheduled. She will start cycle #7 today. The patient would come back for follow-up visit in 3 weeks for reevaluation before starting cycle #8.  For the history of venous thrombosis, the patient will continue treatment with subcutaneous Lovenox as scheduled. She was advised to call immediately if she has any concerning symptoms in the interval.  Disclaimer: This note was dictated with voice recognition software. Similar sounding words can inadvertently be transcribed and may be missed upon review. Eilleen Kempf., MD 03/18/2015

## 2015-03-14 ENCOUNTER — Telehealth: Payer: Self-pay | Admitting: Internal Medicine

## 2015-03-14 NOTE — Telephone Encounter (Signed)
s.w. pt and advised on extended sched....pt will pick up new sched at next visit

## 2015-03-17 NOTE — Patient Instructions (Signed)
Continue with labs and chemotherapy as scheduled Follow up in 3 weeks, prior to your next scheduled cycle of chemotherapy

## 2015-03-20 ENCOUNTER — Ambulatory Visit (HOSPITAL_BASED_OUTPATIENT_CLINIC_OR_DEPARTMENT_OTHER): Payer: Medicare Other

## 2015-03-20 ENCOUNTER — Other Ambulatory Visit (HOSPITAL_BASED_OUTPATIENT_CLINIC_OR_DEPARTMENT_OTHER): Payer: Medicare Other

## 2015-03-20 VITALS — BP 120/67 | HR 105 | Temp 98.2°F | Resp 20

## 2015-03-20 DIAGNOSIS — C3481 Malignant neoplasm of overlapping sites of right bronchus and lung: Secondary | ICD-10-CM

## 2015-03-20 DIAGNOSIS — C3411 Malignant neoplasm of upper lobe, right bronchus or lung: Secondary | ICD-10-CM | POA: Diagnosis not present

## 2015-03-20 DIAGNOSIS — Z5111 Encounter for antineoplastic chemotherapy: Secondary | ICD-10-CM | POA: Diagnosis not present

## 2015-03-20 LAB — CBC WITH DIFFERENTIAL/PLATELET
BASO%: 1.4 % (ref 0.0–2.0)
Basophils Absolute: 0 10*3/uL (ref 0.0–0.1)
EOS%: 0.7 % (ref 0.0–7.0)
Eosinophils Absolute: 0 10*3/uL (ref 0.0–0.5)
HCT: 30.7 % — ABNORMAL LOW (ref 34.8–46.6)
HGB: 9.8 g/dL — ABNORMAL LOW (ref 11.6–15.9)
LYMPH%: 21.1 % (ref 14.0–49.7)
MCH: 29.3 pg (ref 25.1–34.0)
MCHC: 31.9 g/dL (ref 31.5–36.0)
MCV: 91.6 fL (ref 79.5–101.0)
MONO#: 0.4 10*3/uL (ref 0.1–0.9)
MONO%: 15.1 % — ABNORMAL HIGH (ref 0.0–14.0)
NEUT#: 1.8 10*3/uL (ref 1.5–6.5)
NEUT%: 61.7 % (ref 38.4–76.8)
Platelets: 140 10*3/uL — ABNORMAL LOW (ref 145–400)
RBC: 3.35 10*6/uL — ABNORMAL LOW (ref 3.70–5.45)
RDW: 18.6 % — ABNORMAL HIGH (ref 11.2–14.5)
WBC: 2.8 10*3/uL — ABNORMAL LOW (ref 3.9–10.3)
lymph#: 0.6 10*3/uL — ABNORMAL LOW (ref 0.9–3.3)

## 2015-03-20 LAB — COMPREHENSIVE METABOLIC PANEL (CC13)
ALT: 20 U/L (ref 0–55)
AST: 27 U/L (ref 5–34)
Albumin: 3 g/dL — ABNORMAL LOW (ref 3.5–5.0)
Alkaline Phosphatase: 70 U/L (ref 40–150)
Anion Gap: 10 mEq/L (ref 3–11)
BUN: 7 mg/dL (ref 7.0–26.0)
CO2: 24 mEq/L (ref 22–29)
Calcium: 8.1 mg/dL — ABNORMAL LOW (ref 8.4–10.4)
Chloride: 102 mEq/L (ref 98–109)
Creatinine: 0.8 mg/dL (ref 0.6–1.1)
EGFR: 86 mL/min/{1.73_m2} — ABNORMAL LOW (ref >90)
Glucose: 269 mg/dl — ABNORMAL HIGH (ref 70–140)
Potassium: 4.5 mEq/L (ref 3.5–5.1)
Sodium: 136 mEq/L (ref 136–145)
Total Bilirubin: 0.27 mg/dL (ref 0.20–1.20)
Total Protein: 6.6 g/dL (ref 6.4–8.3)

## 2015-03-20 LAB — TECHNOLOGIST REVIEW

## 2015-03-20 MED ORDER — PROCHLORPERAZINE MALEATE 10 MG PO TABS
ORAL_TABLET | ORAL | Status: AC
  Filled 2015-03-20: qty 1

## 2015-03-20 MED ORDER — PROCHLORPERAZINE MALEATE 10 MG PO TABS
10.0000 mg | ORAL_TABLET | Freq: Once | ORAL | Status: AC
  Administered 2015-03-20: 10 mg via ORAL

## 2015-03-20 MED ORDER — SODIUM CHLORIDE 0.9 % IV SOLN
Freq: Once | INTRAVENOUS | Status: AC
  Administered 2015-03-20: 12:00:00 via INTRAVENOUS

## 2015-03-20 MED ORDER — SODIUM CHLORIDE 0.9 % IJ SOLN
10.0000 mL | INTRAMUSCULAR | Status: DC | PRN
  Administered 2015-03-20: 10 mL
  Filled 2015-03-20: qty 10

## 2015-03-20 MED ORDER — HEPARIN SOD (PORK) LOCK FLUSH 100 UNIT/ML IV SOLN
500.0000 [IU] | Freq: Once | INTRAVENOUS | Status: AC | PRN
  Administered 2015-03-20: 500 [IU]
  Filled 2015-03-20: qty 5

## 2015-03-20 MED ORDER — SODIUM CHLORIDE 0.9 % IV SOLN
800.0000 mg/m2 | Freq: Once | INTRAVENOUS | Status: AC
  Administered 2015-03-20: 1558 mg via INTRAVENOUS
  Filled 2015-03-20: qty 40.98

## 2015-03-20 NOTE — Patient Instructions (Signed)
[facility] Discharge Instructions for Patients Receiving Chemotherapy  Today you received the following chemotherapy agent: Gemzar   To help prevent nausea and vomiting after your treatment, we encourage you to take your nausea medication as prescribed.    If you develop nausea and vomiting that is not controlled by your nausea medication, call the clinic.   BELOW ARE SYMPTOMS THAT SHOULD BE REPORTED IMMEDIATELY:  *FEVER GREATER THAN 100.5 F  *CHILLS WITH OR WITHOUT FEVER  NAUSEA AND VOMITING THAT IS NOT CONTROLLED WITH YOUR NAUSEA MEDICATION  *UNUSUAL SHORTNESS OF BREATH  *UNUSUAL BRUISING OR BLEEDING  TENDERNESS IN MOUTH AND THROAT WITH OR WITHOUT PRESENCE OF ULCERS  *URINARY PROBLEMS  *BOWEL PROBLEMS  UNUSUAL RASH Items with * indicate a potential emergency and should be followed up as soon as possible.  Feel free to call the clinic you have any questions or concerns. The clinic phone number is [phone].  Please show the [facility] at check-in to the Emergency Department and triage nurse.

## 2015-03-20 NOTE — Progress Notes (Signed)
Labs reviewed with MD, ok to treat

## 2015-03-26 ENCOUNTER — Other Ambulatory Visit: Payer: Self-pay | Admitting: Internal Medicine

## 2015-03-27 ENCOUNTER — Telehealth: Payer: Self-pay | Admitting: Internal Medicine

## 2015-03-27 ENCOUNTER — Other Ambulatory Visit (HOSPITAL_BASED_OUTPATIENT_CLINIC_OR_DEPARTMENT_OTHER): Payer: Medicare Other

## 2015-03-27 ENCOUNTER — Other Ambulatory Visit: Payer: Medicare Other

## 2015-03-27 DIAGNOSIS — Z86718 Personal history of other venous thrombosis and embolism: Secondary | ICD-10-CM

## 2015-03-27 DIAGNOSIS — C3411 Malignant neoplasm of upper lobe, right bronchus or lung: Secondary | ICD-10-CM

## 2015-03-27 LAB — CBC WITH DIFFERENTIAL/PLATELET
BASO%: 0.4 % (ref 0.0–2.0)
Basophils Absolute: 0 10*3/uL (ref 0.0–0.1)
EOS ABS: 0 10*3/uL (ref 0.0–0.5)
EOS%: 0.4 % (ref 0.0–7.0)
HCT: 32.4 % — ABNORMAL LOW (ref 34.8–46.6)
HGB: 10.4 g/dL — ABNORMAL LOW (ref 11.6–15.9)
LYMPH#: 0.8 10*3/uL — AB (ref 0.9–3.3)
LYMPH%: 16.6 % (ref 14.0–49.7)
MCH: 29.2 pg (ref 25.1–34.0)
MCHC: 32.1 g/dL (ref 31.5–36.0)
MCV: 91 fL (ref 79.5–101.0)
MONO#: 0.7 10*3/uL (ref 0.1–0.9)
MONO%: 15.9 % — ABNORMAL HIGH (ref 0.0–14.0)
NEUT#: 3.1 10*3/uL (ref 1.5–6.5)
NEUT%: 66.7 % (ref 38.4–76.8)
Platelets: 86 10*3/uL — ABNORMAL LOW (ref 145–400)
RBC: 3.56 10*6/uL — AB (ref 3.70–5.45)
RDW: 19.3 % — ABNORMAL HIGH (ref 11.2–14.5)
WBC: 4.7 10*3/uL (ref 3.9–10.3)
nRBC: 1 % — ABNORMAL HIGH (ref 0–0)

## 2015-03-27 LAB — COMPREHENSIVE METABOLIC PANEL (CC13)
ALT: 15 U/L (ref 0–55)
AST: 30 U/L (ref 5–34)
Albumin: 3.4 g/dL — ABNORMAL LOW (ref 3.5–5.0)
Alkaline Phosphatase: 77 U/L (ref 40–150)
Anion Gap: 10 mEq/L (ref 3–11)
BILIRUBIN TOTAL: 0.44 mg/dL (ref 0.20–1.20)
BUN: 8.5 mg/dL (ref 7.0–26.0)
CALCIUM: 8.6 mg/dL (ref 8.4–10.4)
CO2: 25 meq/L (ref 22–29)
Chloride: 101 mEq/L (ref 98–109)
Creatinine: 0.7 mg/dL (ref 0.6–1.1)
EGFR: >90 mL/min/{1.73_m2} (ref >90)
Glucose: 149 mg/dl — ABNORMAL HIGH (ref 70–140)
Potassium: 4.4 mEq/L (ref 3.5–5.1)
SODIUM: 136 meq/L (ref 136–145)
Total Protein: 7.1 g/dL (ref 6.4–8.3)

## 2015-03-27 LAB — TECHNOLOGIST REVIEW

## 2015-03-27 NOTE — Telephone Encounter (Signed)
returned call and r/s miss appt....pt ok and aware of new d.t

## 2015-04-03 ENCOUNTER — Telehealth: Payer: Self-pay | Admitting: Internal Medicine

## 2015-04-03 ENCOUNTER — Ambulatory Visit (HOSPITAL_BASED_OUTPATIENT_CLINIC_OR_DEPARTMENT_OTHER): Payer: Medicare Other | Admitting: Physician Assistant

## 2015-04-03 ENCOUNTER — Ambulatory Visit (HOSPITAL_BASED_OUTPATIENT_CLINIC_OR_DEPARTMENT_OTHER): Payer: Medicare Other

## 2015-04-03 ENCOUNTER — Encounter: Payer: Self-pay | Admitting: Physician Assistant

## 2015-04-03 ENCOUNTER — Ambulatory Visit: Payer: Medicare Other

## 2015-04-03 ENCOUNTER — Other Ambulatory Visit (HOSPITAL_BASED_OUTPATIENT_CLINIC_OR_DEPARTMENT_OTHER): Payer: Medicare Other

## 2015-04-03 VITALS — BP 139/66 | HR 111 | Temp 98.7°F | Resp 18 | Ht 67.0 in | Wt 168.5 lb

## 2015-04-03 DIAGNOSIS — C3411 Malignant neoplasm of upper lobe, right bronchus or lung: Secondary | ICD-10-CM

## 2015-04-03 DIAGNOSIS — Z7901 Long term (current) use of anticoagulants: Secondary | ICD-10-CM | POA: Diagnosis not present

## 2015-04-03 DIAGNOSIS — Z5111 Encounter for antineoplastic chemotherapy: Secondary | ICD-10-CM | POA: Diagnosis not present

## 2015-04-03 DIAGNOSIS — Z86718 Personal history of other venous thrombosis and embolism: Secondary | ICD-10-CM

## 2015-04-03 LAB — COMPREHENSIVE METABOLIC PANEL (CC13)
ALBUMIN: 3.4 g/dL — AB (ref 3.5–5.0)
ALK PHOS: 74 U/L (ref 40–150)
ALT: 12 U/L (ref 0–55)
ANION GAP: 11 meq/L (ref 3–11)
AST: 19 U/L (ref 5–34)
BUN: 8.5 mg/dL (ref 7.0–26.0)
CALCIUM: 8.4 mg/dL (ref 8.4–10.4)
CHLORIDE: 103 meq/L (ref 98–109)
CO2: 23 mEq/L (ref 22–29)
Creatinine: 0.7 mg/dL (ref 0.6–1.1)
EGFR: 88 mL/min/{1.73_m2} — ABNORMAL LOW (ref >90)
Glucose: 206 mg/dl — ABNORMAL HIGH (ref 70–140)
POTASSIUM: 4.8 meq/L (ref 3.5–5.1)
Sodium: 137 mEq/L (ref 136–145)
Total Bilirubin: 0.31 mg/dL (ref 0.20–1.20)
Total Protein: 6.8 g/dL (ref 6.4–8.3)

## 2015-04-03 LAB — CBC WITH DIFFERENTIAL/PLATELET
BASO%: 0.5 % (ref 0.0–2.0)
BASOS ABS: 0 10*3/uL (ref 0.0–0.1)
EOS%: 3.1 % (ref 0.0–7.0)
Eosinophils Absolute: 0.2 10*3/uL (ref 0.0–0.5)
HEMATOCRIT: 34.4 % — AB (ref 34.8–46.6)
HGB: 10.7 g/dL — ABNORMAL LOW (ref 11.6–15.9)
LYMPH%: 11.1 % — ABNORMAL LOW (ref 14.0–49.7)
MCH: 28.8 pg (ref 25.1–34.0)
MCHC: 31.1 g/dL — ABNORMAL LOW (ref 31.5–36.0)
MCV: 92.7 fL (ref 79.5–101.0)
MONO#: 0.5 10*3/uL (ref 0.1–0.9)
MONO%: 8.8 % (ref 0.0–14.0)
NEUT#: 4.5 10*3/uL (ref 1.5–6.5)
NEUT%: 76.5 % (ref 38.4–76.8)
Platelets: 264 10*3/uL (ref 145–400)
RBC: 3.71 10*6/uL (ref 3.70–5.45)
RDW: 19.1 % — ABNORMAL HIGH (ref 11.2–14.5)
WBC: 5.9 10*3/uL (ref 3.9–10.3)
lymph#: 0.7 10*3/uL — ABNORMAL LOW (ref 0.9–3.3)

## 2015-04-03 MED ORDER — PROCHLORPERAZINE EDISYLATE 5 MG/ML IJ SOLN
INTRAMUSCULAR | Status: AC
  Filled 2015-04-03: qty 2

## 2015-04-03 MED ORDER — PROCHLORPERAZINE MALEATE 10 MG PO TABS: 10.0000 mg | ORAL_TABLET | Freq: Four times a day (QID) | ORAL | Status: DC | PRN

## 2015-04-03 MED ORDER — HEPARIN SOD (PORK) LOCK FLUSH 100 UNIT/ML IV SOLN
500.0000 [IU] | Freq: Once | INTRAVENOUS | Status: AC | PRN
  Administered 2015-04-03: 500 [IU]
  Filled 2015-04-03: qty 5

## 2015-04-03 MED ORDER — SODIUM CHLORIDE 0.9 % IV SOLN
Freq: Once | INTRAVENOUS | Status: AC
  Administered 2015-04-03: 14:00:00 via INTRAVENOUS

## 2015-04-03 MED ORDER — SODIUM CHLORIDE 0.9 % IJ SOLN
10.0000 mL | INTRAMUSCULAR | Status: DC | PRN
  Administered 2015-04-03: 10 mL
  Filled 2015-04-03: qty 10

## 2015-04-03 MED ORDER — SODIUM CHLORIDE 0.9 % IV SOLN
800.0000 mg/m2 | Freq: Once | INTRAVENOUS | Status: AC
  Administered 2015-04-03: 1558 mg via INTRAVENOUS
  Filled 2015-04-03: qty 40.98

## 2015-04-03 MED ORDER — PROCHLORPERAZINE EDISYLATE 5 MG/ML IJ SOLN
10.0000 mg | Freq: Once | INTRAMUSCULAR | Status: AC
  Administered 2015-04-03: 10 mg via INTRAVENOUS

## 2015-04-03 MED ORDER — OXYCODONE HCL 5 MG PO TABS: 5.0000 mg | ORAL_TABLET | ORAL | Status: DC | PRN

## 2015-04-03 NOTE — Patient Instructions (Signed)
[facility] Discharge Instructions for Patients Receiving Chemotherapy  Today you received the following chemotherapy agents Gemzar  To help prevent nausea and vomiting after your treatment, we encourage you to take your nausea medication as prescribed   If you develop nausea and vomiting that is not controlled by your nausea medication, call the clinic.   BELOW ARE SYMPTOMS THAT SHOULD BE REPORTED IMMEDIATELY:  *FEVER GREATER THAN 100.5 F  *CHILLS WITH OR WITHOUT FEVER  NAUSEA AND VOMITING THAT IS NOT CONTROLLED WITH YOUR NAUSEA MEDICATION  *UNUSUAL SHORTNESS OF BREATH  *UNUSUAL BRUISING OR BLEEDING  TENDERNESS IN MOUTH AND THROAT WITH OR WITHOUT PRESENCE OF ULCERS  *URINARY PROBLEMS  *BOWEL PROBLEMS  UNUSUAL RASH Items with * indicate a potential emergency and should be followed up as soon as possible.  Feel free to call the clinic you have any questions or concerns. The clinic phone number is [phone].  Please show the CHEMO ALERT CARD at check-in to the Emergency Department and triage nurse.   

## 2015-04-03 NOTE — Telephone Encounter (Signed)
Gave and printed appt sched and avs fo rpt for June and JUly

## 2015-04-03 NOTE — Progress Notes (Signed)
[place] [phone]   [phone]  OFFICE PROGRESS NOTE  [name], NP [place] [address]  DIAGNOSIS: Recurrent non-small cell lung cancer, squamous cell carcinoma initially diagnosis stage IIIA (T1 N2 MX ) in August of 2010.   PRIOR THERAPY:  #1 status post concurrent chemoradiation with weekly carboplatin and paclitaxel, last dose of chemotherapy given 08/05/2011.  #2 status post consolidation chemotherapy with carboplatin paclitaxel last dose given 10/27/2009.  #3 Concurrent chemoradiation with weekly carboplatin for an AUC of 2 and paclitaxel at 45 mg per meter squared given concurrent with radiotherapy, last dose was given 09/20/2011.  #4 Systemic chemotherapy with gemcitabine 1000 mg meter squared on days 1 and 8 every 3 weeks status post 3 cycles with stable disease, last dose was given 12/20/2011.  #5 Systemic chemotherapy again with single agent gemcitabine 1000 mg/M2 on days 1 and 8 every 3 weeks. First cycle 11/07/2013. Status post 4 cycles. #6  Systemic chemotherapy again with single agent gemcitabine 1000 mg/M2 on days 1 and 8 every 3 weeks. First dose 07/24/2014. Discontinued today secondary to intolerance. #7  Immunotherapy with Nivolumab 3 MG/KG every 2 weeks. First dose 09/18/2014. She is status post 2 cycles. Last dose was given 10/02/2014 discontinued secondary to disease progression with significant SVC syndrome.  CURRENT THERAPY: Systemic chemotherapy with gemcitabine 800 MG/M2 on days 1 and 8 every 3 weeks. First dose 11/07/2014. Status post 7 cycles.  INTERVAL HISTORY: Janice Ball 59 y.o. female returns to the clinic today for follow-up visit. The patient is feeling fine today with no specific complaints except for mild shortness of breath. She is tolerating her current treatment with single agent gemcitabine fairly well with no significant adverse effects. She continues to have intermittent  fevers with a MAXIMUM TEMPERATURE of 101. Most recent fever was after day 8 of the last cycle of chemotherapy. The fever broke on its own. Patient states that she drink ice water. She denied any other symptoms. She continues to have a cough productive of clear secretions. She also reports nausea that is well-controlled with her Compazine. She requests a refill for Compazine tablets. She denied any vomiting, diarrhea or constipation. She denied having any significant chest pain but has mild cough with no hemoptysis. She denied having any significant weight loss or night sweats. She denied having any nausea or vomiting. She has chills but no fever. She also requests a refill for her pain medication.  MEDICAL HISTORY: Past Medical History  Diagnosis Date  . Anxiety   . Hyperlipidemia   . Hypothyroidism   . COPD (chronic obstructive pulmonary disease)   . Arthritis     thumbs  . 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 Lung cancer dx'd 05/2009  . Lung cancer dx'd 09/2013    recurrence in LN  . Diabetes mellitus     ALLERGIES:  is allergic to carboplatin; sulfonamide derivatives; codeine; dilaudid; penicillins; and vicodin.  MEDICATIONS:  Current Outpatient Prescriptions  Medication Sig Dispense Refill  . acetaminophen (TYLENOL) 325 MG tablet Take 650 mg by mouth every 6 (six) hours as needed for mild pain or headache.     . alendronate (FOSAMAX) 10 MG tablet Take 10 mg by mouth daily.     . budesonide-formoterol (SYMBICORT) 160-4.5 MCG/ACT inhaler Inhale 2 puffs into the lungs 2 (two) times daily.    Marland Kitchen  cholecalciferol (VITAMIN D) 1000 UNITS tablet Take 1,000 Units by mouth every morning.     . [employer] ASPIRIN ADULT LOW DOSE 81 MG chewable tablet Chew 81 mg by mouth daily.  0  . cyanocobalamin 1000 MCG tablet Take 1,000 mcg by mouth every morning.     . enoxaparin (LOVENOX) 120 MG/0.8ML injection  Inject 0.8 mLs (120 mg total) into the skin daily. 30 Syringe 1  . glimepiride (AMARYL) 2 MG tablet Take 2 mg by mouth daily with breakfast.     . guaiFENesin (ROBITUSSIN) 100 MG/5ML SOLN Take 5-10 mLs (100-200 mg total) by mouth every 4 (four) hours as needed for cough. 120 mL 0  . ipratropium (ATROVENT) 0.02 % nebulizer solution Take 2.5 mLs (0.5 mg total) by nebulization 4 (four) times daily. 150 mL 4  . levothyroxine (SYNTHROID, LEVOTHROID) 137 MCG tablet Take 137 mcg by mouth daily.     Marland Kitchen lidocaine-prilocaine (EMLA) cream Apply 1 application topically as needed. Apply to port 1 hr before chemo 30 g 0  . LORazepam (ATIVAN) 1 MG tablet Take 1 mg by mouth 3 (three) times daily as needed for anxiety.     . magnesium gluconate (MAGONATE) 500 MG tablet Take 500 mg by mouth every morning.     . metFORMIN (GLUCOPHAGE) 1000 MG tablet Take 1,000 mg by mouth 2 (two) times daily with a meal.    . Multiple Vitamin (MULTIVITAMIN WITH MINERALS) TABS tablet Take 1 tablet by mouth every morning.    Marland Kitchen OVER THE COUNTER MEDICATION Take 1 capsule by mouth daily. MSM. For energy and joint pain    . oxyCODONE (OXY IR/ROXICODONE) 5 MG immediate release tablet Take 1 tablet (5 mg total) by mouth every 4 (four) hours as needed for moderate pain. 60 tablet 0  . PROAIR HFA 108 (90 BASE) MCG/ACT inhaler INHALE 2 PUFFS INTO THE LUNGS EVERY 6 (SIX) HOURS AS NEEDED FOR WHEEZING OR SHORTNESS OF BREATH. 1 Inhaler 0  . prochlorperazine (COMPAZINE) 10 MG tablet Take 1 tablet (10 mg total) by mouth every 6 (six) hours as needed for nausea or vomiting. 30 tablet 1  . sertraline (ZOLOFT) 100 MG tablet Take 150 mg by mouth at bedtime.     Marland Kitchen spironolactone (ALDACTONE) 25 MG tablet Take 25 mg by mouth every morning.      No current facility-administered medications for this visit.   Facility-Administered Medications Ordered in Other Visits  Medication Dose Route Frequency Provider Last Rate Last Dose  . sodium chloride 0.9 %  injection 10 mL  10 mL Intracatheter PRN Curt Bears, MD   10 mL at 11/07/14 1558  . sodium chloride 0.9 % injection 10 mL  10 mL Intracatheter PRN Curt Bears, MD   10 mL at 04/03/15 1444    SURGICAL HISTORY:  Past Surgical History  Procedure Laterality Date  . Thyroidectomy, partial    . Tonsillectomy    . Tubal ligation    . Partial hysterectomy      REVIEW OF SYSTEMS:  Constitutional: positive for fatigue Eyes: negative Ears, nose, mouth, throat, and face: negative Respiratory: positive for cough and dyspnea on exertion Cardiovascular: negative Gastrointestinal: negative Genitourinary:negative Integument/breast: negative Hematologic/lymphatic: negative Musculoskeletal:negative Neurological: negative Behavioral/Psych: negative Endocrine: negative Allergic/Immunologic: negative   PHYSICAL EXAMINATION: General appearance: alert, cooperative, no distress and Swelling of her face and puffiness of her eye Head: Normocephalic, without obvious abnormality, atraumatic Neck: no adenopathy, no JVD, supple, symmetrical, trachea midline and thyroid not enlarged, symmetric, no tenderness/mass/nodules Lymph  nodes: Cervical, supraclavicular, and axillary nodes normal. Resp: wheezes bilaterally Back: symmetric, no curvature. ROM normal. No CVA tenderness. Cardio: regular rate and rhythm, S1, S2 normal, no murmur, click, rub or gallop GI: soft, non-tender; bowel sounds normal; no masses,  no organomegaly Extremities: extremities normal, atraumatic, no cyanosis or edema Neurologic: Alert and oriented X 3, normal strength and tone. Normal symmetric reflexes. Normal coordination and gait  ECOG PERFORMANCE STATUS: 1 - Symptomatic but completely ambulatory  Blood pressure 139/66, pulse 111, temperature 98.7 F (37.1 C), temperature source Oral, resp. rate 18, height '5\' 7"'$  (1.702 m), weight 168 lb 8 oz (76.431 kg), SpO2 97 %.  LABORATORY DATA: Lab Results  Component Value Date    WBC 5.9 04/03/2015   HGB 10.7* 04/03/2015   HCT 34.4* 04/03/2015   MCV 92.7 04/03/2015   PLT 264 04/03/2015      Chemistry      Component Value Date/Time   NA 137 04/03/2015 1145   NA 139 01/24/2015 0450   NA 141 04/04/2012 0945   K 4.8 04/03/2015 1145   K 3.3* 01/24/2015 0450   K 4.7 04/04/2012 0945   CL 100 01/24/2015 0450   CL 101 04/06/2013 1041   CL 100 04/04/2012 0945   CO2 23 04/03/2015 1145   CO2 30 01/24/2015 0450   CO2 29 04/04/2012 0945   BUN 8.5 04/03/2015 1145   BUN 12 01/24/2015 0450   BUN 16 04/04/2012 0945   CREATININE 0.7 04/03/2015 1145   CREATININE 0.60 01/24/2015 0450   CREATININE 0.9 04/04/2012 0945      Component Value Date/Time   CALCIUM 8.4 04/03/2015 1145   CALCIUM 8.5 01/24/2015 0450   CALCIUM 8.4 04/04/2012 0945   ALKPHOS 74 04/03/2015 1145   ALKPHOS 59 01/18/2015 2115   ALKPHOS 61 04/04/2012 0945   AST 19 04/03/2015 1145   AST 36 01/18/2015 2115   AST 31 04/04/2012 0945   ALT 12 04/03/2015 1145   ALT 29 01/18/2015 2115   ALT 30 04/04/2012 0945   BILITOT 0.31 04/03/2015 1145   BILITOT 0.5 01/18/2015 2115   BILITOT 0.50 04/04/2012 0945       RADIOGRAPHIC STUDIES: No results found. ASSESSMENT AND PLAN: This is a very pleasant 59 years old white female with recurrent non-small cell lung cancer status post concurrent chemoradiation as well as consolidation chemotherapy and she is currently on systemic chemotherapy with single agent gemcitabine status post 4 cycles.  She was treated again with a course of systemic chemotherapy with single agent gemcitabine for 3 cycles but the patient has rough time tolerating her treatment with significant fatigue and weakness. The recent CT scan of the chest, abdomen and pelvis showed stable disease but the patient continues to have the persistent right upper lobe area of mass fibrosis and consolidation as well as persistent right hilar, subcarinal and right paratracheal lymphadenopathy as well as, one area  of masslike soft tissue immediately anterior to the carina. She was started on treatment with Nivolumab status post 2 cycles. She tolerated the treatment well but she presented with evidence for disease progression in the chest. This treatment was discontinued. 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 Further imaging studies showed evidence for disease progression. The patient was restarted on systemic chemotherapy initially with carboplatin and gemcitabine body carboplatin was discontinued secondary to hypersensitivity reaction. She continued on treatment with single agent gemcitabine with reduced dose 800 MG/M2 on days 1 and 8 every 3  weeks status post 7 cycles. She is tolerating her treatment fairly well with no significant adverse effects. The recent CT scan of the chest, abdomen and pelvis showed stable disease. The patient was discussed with and also seen by Dr. [name]. The intermittent fevers that she is experiencing are likely "tumor fever". She will continue with the same regimen of gemcitabine 800 MG/M2 on days 1 and 8 every 3 weeks as a scheduled. She will start cycle #8 today. She was given refill prescriptions for her Compazine and Percocet tablets. She will follow-up in 3 weeks prior to the start of cycle #9 for reevaluation.  For the history of deep venous thrombosis she will continue on Lovenox subcutaneously on daily basis.  She was advised to call immediately if she has any concerning symptoms in the interval.  All questions were answered. The patient knows to call the clinic with any problems, questions or concerns. We can certainly see the patient much sooner if necessary.  Carlton Adam, PA-C 04/03/2015     ADDENDUM: Hematology/Oncology Attending: I had a face to face encounter with the patient. I recommended her care plan. This is a very pleasant 59 years old white female with recurrent non-small cell lung cancer,  squamous cell carcinoma currently on treatment with reduced dose gemcitabine on days 1 and 8. She is tolerating her treatment fairly well with no significant adverse effects. I recommended for the patient to proceed with cycle #8 as scheduled. She will come back for follow-up visit in 3 weeks for reevaluation before starting cycle #9. For the SVC deep venous thrombosis, the patient will continue on treatment with subcutaneous Lovenox. She was advised to call immediately if she has any concerning symptoms in the interval.  Disclaimer: This note was dictated with voice recognition software. Similar sounding words can inadvertently be transcribed and may be missed upon review.  Eilleen Kempf., MD 04/08/2015

## 2015-04-08 NOTE — Patient Instructions (Signed)
Continue labs and chemotherapy as scheduled Follow up in 3 weeks 

## 2015-04-09 ENCOUNTER — Encounter: Payer: Self-pay | Admitting: Skilled Nursing Facility1

## 2015-04-09 NOTE — Progress Notes (Signed)
Subjective:     Patient ID: Janice Ball, female   DOB: [birth year], 59 y.o.   MRN: [id number]  HPI   Review of Systems     Objective:   Physical Exam To assist the pt in identifying some dietary strategies to gain lost wt back.    Assessment:     Pt identified as being malnourished due to wt loss. Pt was contacted via the telephone at [phone]. Pt states her appetite comes and goes but it is mostly good. Pt states she has a little bit of nausea every day but it is managed with medication. Pt states she avoids ice cream because she has diabetes. Pt states she thinks she has lost about 10 pounds.     Plan:     Dietitian advised she has foods available in the house for when she has an increased appetite and there are no off limits foods so she can consume ice cream with sugar free being the best option. Dietitian also offered the option of consuming cheese in her soups and vegetables.  Dietitian advised the pt to call the center if she has any further wt loss or has an questions.

## 2015-04-10 ENCOUNTER — Other Ambulatory Visit (HOSPITAL_BASED_OUTPATIENT_CLINIC_OR_DEPARTMENT_OTHER): Payer: Medicare Other

## 2015-04-10 ENCOUNTER — Ambulatory Visit (HOSPITAL_BASED_OUTPATIENT_CLINIC_OR_DEPARTMENT_OTHER): Payer: Medicare Other

## 2015-04-10 VITALS — BP 130/65 | HR 113 | Temp 98.1°F | Resp 20

## 2015-04-10 DIAGNOSIS — C3411 Malignant neoplasm of upper lobe, right bronchus or lung: Secondary | ICD-10-CM | POA: Diagnosis not present

## 2015-04-10 DIAGNOSIS — Z5111 Encounter for antineoplastic chemotherapy: Secondary | ICD-10-CM

## 2015-04-10 LAB — CBC WITH DIFFERENTIAL/PLATELET
BASO%: 1.4 % (ref 0.0–2.0)
BASOS ABS: 0 10*3/uL (ref 0.0–0.1)
EOS%: 1.1 % (ref 0.0–7.0)
Eosinophils Absolute: 0 10*3/uL (ref 0.0–0.5)
HEMATOCRIT: 31.1 % — AB (ref 34.8–46.6)
HEMOGLOBIN: 9.7 g/dL — AB (ref 11.6–15.9)
LYMPH%: 18.7 % (ref 14.0–49.7)
MCH: 28.4 pg (ref 25.1–34.0)
MCHC: 31.2 g/dL — ABNORMAL LOW (ref 31.5–36.0)
MCV: 90.9 fL (ref 79.5–101.0)
MONO#: 0.5 10*3/uL (ref 0.1–0.9)
MONO%: 19.4 % — ABNORMAL HIGH (ref 0.0–14.0)
NEUT%: 59.4 % (ref 38.4–76.8)
NEUTROS ABS: 1.7 10*3/uL (ref 1.5–6.5)
Platelets: 135 10*3/uL — ABNORMAL LOW (ref 145–400)
RBC: 3.42 10*6/uL — ABNORMAL LOW (ref 3.70–5.45)
RDW: 18 % — ABNORMAL HIGH (ref 11.2–14.5)
WBC: 2.8 10*3/uL — ABNORMAL LOW (ref 3.9–10.3)
lymph#: 0.5 10*3/uL — ABNORMAL LOW (ref 0.9–3.3)

## 2015-04-10 LAB — COMPREHENSIVE METABOLIC PANEL (CC13)
ALT: 23 U/L (ref 0–55)
AST: 32 U/L (ref 5–34)
Albumin: 3.2 g/dL — ABNORMAL LOW (ref 3.5–5.0)
Alkaline Phosphatase: 72 U/L (ref 40–150)
Anion Gap: 11 mEq/L (ref 3–11)
BILIRUBIN TOTAL: 0.29 mg/dL (ref 0.20–1.20)
BUN: 8.3 mg/dL (ref 7.0–26.0)
CHLORIDE: 103 meq/L (ref 98–109)
CO2: 24 mEq/L (ref 22–29)
Calcium: 8.2 mg/dL — ABNORMAL LOW (ref 8.4–10.4)
Creatinine: 0.7 mg/dL (ref 0.6–1.1)
EGFR: 88 mL/min/{1.73_m2} — ABNORMAL LOW (ref >90)
Glucose: 220 mg/dl — ABNORMAL HIGH (ref 70–140)
Potassium: 4.4 mEq/L (ref 3.5–5.1)
Sodium: 138 mEq/L (ref 136–145)
Total Protein: 6.6 g/dL (ref 6.4–8.3)

## 2015-04-10 MED ORDER — SODIUM CHLORIDE 0.9 % IV SOLN
Freq: Once | INTRAVENOUS | Status: AC
  Administered 2015-04-10: 12:00:00 via INTRAVENOUS

## 2015-04-10 MED ORDER — SODIUM CHLORIDE 0.9 % IV SOLN
800.0000 mg/m2 | Freq: Once | INTRAVENOUS | Status: AC
  Administered 2015-04-10: 1558 mg via INTRAVENOUS
  Filled 2015-04-10: qty 41

## 2015-04-10 MED ORDER — SODIUM CHLORIDE 0.9 % IJ SOLN
10.0000 mL | INTRAMUSCULAR | Status: DC | PRN
  Administered 2015-04-10: 10 mL
  Filled 2015-04-10: qty 10

## 2015-04-10 MED ORDER — PROCHLORPERAZINE MALEATE 10 MG PO TABS
10.0000 mg | ORAL_TABLET | Freq: Once | ORAL | Status: AC
  Administered 2015-04-10: 10 mg via ORAL

## 2015-04-10 MED ORDER — PROCHLORPERAZINE MALEATE 10 MG PO TABS
ORAL_TABLET | ORAL | Status: AC
  Filled 2015-04-10: qty 1

## 2015-04-10 MED ORDER — HEPARIN SOD (PORK) LOCK FLUSH 100 UNIT/ML IV SOLN
500.0000 [IU] | Freq: Once | INTRAVENOUS | Status: AC | PRN
  Administered 2015-04-10: 500 [IU]
  Filled 2015-04-10: qty 5

## 2015-04-10 NOTE — Patient Instructions (Signed)
[facility] Discharge Instructions for Patients Receiving Chemotherapy  Today you received the following chemotherapy agents:  Gemzar  To help prevent nausea and vomiting after your treatment, we encourage you to take your nausea medication as ordered per MD.   If you develop nausea and vomiting that is not controlled by your nausea medication, call the clinic.   BELOW ARE SYMPTOMS THAT SHOULD BE REPORTED IMMEDIATELY:  *FEVER GREATER THAN 100.5 F  *CHILLS WITH OR WITHOUT FEVER  NAUSEA AND VOMITING THAT IS NOT CONTROLLED WITH YOUR NAUSEA MEDICATION  *UNUSUAL SHORTNESS OF BREATH  *UNUSUAL BRUISING OR BLEEDING  TENDERNESS IN MOUTH AND THROAT WITH OR WITHOUT PRESENCE OF ULCERS  *URINARY PROBLEMS  *BOWEL PROBLEMS  UNUSUAL RASH Items with * indicate a potential emergency and should be followed up as soon as possible.  Feel free to call the clinic you have any questions or concerns. The clinic phone number is [phone].  Please show the CHEMO ALERT CARD at check-in to the Emergency Department and triage nurse.   

## 2015-04-17 ENCOUNTER — Ambulatory Visit: Payer: Medicare Other | Admitting: Physician Assistant

## 2015-04-17 ENCOUNTER — Other Ambulatory Visit (HOSPITAL_BASED_OUTPATIENT_CLINIC_OR_DEPARTMENT_OTHER): Payer: Medicare Other

## 2015-04-17 DIAGNOSIS — C3411 Malignant neoplasm of upper lobe, right bronchus or lung: Secondary | ICD-10-CM

## 2015-04-17 LAB — CBC WITH DIFFERENTIAL/PLATELET
BASO%: 0.4 % (ref 0.0–2.0)
BASOS ABS: 0 10*3/uL (ref 0.0–0.1)
EOS%: 0.2 % (ref 0.0–7.0)
Eosinophils Absolute: 0 10*3/uL (ref 0.0–0.5)
HEMATOCRIT: 29.6 % — AB (ref 34.8–46.6)
HEMOGLOBIN: 9.4 g/dL — AB (ref 11.6–15.9)
LYMPH#: 0.5 10*3/uL — AB (ref 0.9–3.3)
LYMPH%: 9.3 % — ABNORMAL LOW (ref 14.0–49.7)
MCH: 28.9 pg (ref 25.1–34.0)
MCHC: 31.8 g/dL (ref 31.5–36.0)
MCV: 91.1 fL (ref 79.5–101.0)
MONO#: 0.6 10*3/uL (ref 0.1–0.9)
MONO%: 12.4 % (ref 0.0–14.0)
NEUT#: 3.8 10*3/uL (ref 1.5–6.5)
NEUT%: 77.7 % — ABNORMAL HIGH (ref 38.4–76.8)
NRBC: 1 % — AB (ref 0–0)
Platelets: 80 10*3/uL — ABNORMAL LOW (ref 145–400)
RBC: 3.25 10*6/uL — AB (ref 3.70–5.45)
RDW: 18.7 % — ABNORMAL HIGH (ref 11.2–14.5)
WBC: 4.9 10*3/uL (ref 3.9–10.3)

## 2015-04-17 LAB — TECHNOLOGIST REVIEW

## 2015-04-17 LAB — COMPREHENSIVE METABOLIC PANEL (CC13)
ALBUMIN: 3 g/dL — AB (ref 3.5–5.0)
ALT: 15 U/L (ref 0–55)
ANION GAP: 9 meq/L (ref 3–11)
AST: 21 U/L (ref 5–34)
Alkaline Phosphatase: 74 U/L (ref 40–150)
BILIRUBIN TOTAL: 0.39 mg/dL (ref 0.20–1.20)
BUN: 4.8 mg/dL — AB (ref 7.0–26.0)
CO2: 25 mEq/L (ref 22–29)
Calcium: 8.3 mg/dL — ABNORMAL LOW (ref 8.4–10.4)
Chloride: 104 mEq/L (ref 98–109)
Creatinine: 0.7 mg/dL (ref 0.6–1.1)
GLUCOSE: 202 mg/dL — AB (ref 70–140)
Potassium: 4.2 mEq/L (ref 3.5–5.1)
Sodium: 138 mEq/L (ref 136–145)
Total Protein: 6.3 g/dL — ABNORMAL LOW (ref 6.4–8.3)

## 2015-04-20 ENCOUNTER — Other Ambulatory Visit: Payer: Self-pay

## 2015-04-20 ENCOUNTER — Emergency Department (HOSPITAL_COMMUNITY): Payer: Medicare Other

## 2015-04-20 ENCOUNTER — Encounter (HOSPITAL_COMMUNITY): Payer: Self-pay | Admitting: Emergency Medicine

## 2015-04-20 ENCOUNTER — Inpatient Hospital Stay (HOSPITAL_COMMUNITY)
Admission: EM | Admit: 2015-04-20 | Discharge: 2015-04-26 | DRG: 190 | Disposition: A | Discharge status: Home / Self Care | Payer: Medicare Other | Attending: Family Medicine | Admitting: Family Medicine

## 2015-04-20 DIAGNOSIS — D649 Anemia, unspecified: Secondary | ICD-10-CM | POA: Diagnosis present

## 2015-04-20 DIAGNOSIS — Z880 Allergy status to penicillin: Secondary | ICD-10-CM | POA: Diagnosis not present

## 2015-04-20 DIAGNOSIS — E119 Type 2 diabetes mellitus without complications: Secondary | ICD-10-CM | POA: Diagnosis not present

## 2015-04-20 DIAGNOSIS — C3411 Malignant neoplasm of upper lobe, right bronchus or lung: Secondary | ICD-10-CM | POA: Diagnosis present

## 2015-04-20 DIAGNOSIS — J9601 Acute respiratory failure with hypoxia: Secondary | ICD-10-CM | POA: Diagnosis present

## 2015-04-20 DIAGNOSIS — Z87891 Personal history of nicotine dependence: Secondary | ICD-10-CM | POA: Diagnosis not present

## 2015-04-20 DIAGNOSIS — R000 Tachycardia, unspecified: Secondary | ICD-10-CM | POA: Diagnosis present

## 2015-04-20 DIAGNOSIS — Z7982 Long term (current) use of aspirin: Secondary | ICD-10-CM | POA: Diagnosis not present

## 2015-04-20 DIAGNOSIS — Z79899 Other long term (current) drug therapy: Secondary | ICD-10-CM

## 2015-04-20 DIAGNOSIS — Z885 Allergy status to narcotic agent status: Secondary | ICD-10-CM | POA: Diagnosis not present

## 2015-04-20 DIAGNOSIS — Z90711 Acquired absence of uterus with remaining cervical stump: Secondary | ICD-10-CM | POA: Diagnosis present

## 2015-04-20 DIAGNOSIS — E890 Postprocedural hypothyroidism: Secondary | ICD-10-CM | POA: Diagnosis present

## 2015-04-20 DIAGNOSIS — M13842 Other specified arthritis, left hand: Secondary | ICD-10-CM | POA: Diagnosis present

## 2015-04-20 DIAGNOSIS — R0602 Shortness of breath: Secondary | ICD-10-CM

## 2015-04-20 DIAGNOSIS — Z888 Allergy status to other drugs, medicaments and biological substances status: Secondary | ICD-10-CM

## 2015-04-20 DIAGNOSIS — I871 Compression of vein: Secondary | ICD-10-CM | POA: Diagnosis present

## 2015-04-20 DIAGNOSIS — J441 Chronic obstructive pulmonary disease with (acute) exacerbation: Principal | ICD-10-CM | POA: Diagnosis present

## 2015-04-20 DIAGNOSIS — M13841 Other specified arthritis, right hand: Secondary | ICD-10-CM | POA: Diagnosis present

## 2015-04-20 DIAGNOSIS — Z882 Allergy status to sulfonamides status: Secondary | ICD-10-CM | POA: Diagnosis not present

## 2015-04-20 DIAGNOSIS — Z923 Personal history of irradiation: Secondary | ICD-10-CM | POA: Diagnosis not present

## 2015-04-20 DIAGNOSIS — F419 Anxiety disorder, unspecified: Secondary | ICD-10-CM | POA: Diagnosis present

## 2015-04-20 DIAGNOSIS — E785 Hyperlipidemia, unspecified: Secondary | ICD-10-CM | POA: Diagnosis present

## 2015-04-20 DIAGNOSIS — Z833 Family history of diabetes mellitus: Secondary | ICD-10-CM

## 2015-04-20 DIAGNOSIS — R079 Chest pain, unspecified: Secondary | ICD-10-CM | POA: Diagnosis present

## 2015-04-20 DIAGNOSIS — E875 Hyperkalemia: Secondary | ICD-10-CM | POA: Diagnosis not present

## 2015-04-20 DIAGNOSIS — R0902 Hypoxemia: Secondary | ICD-10-CM

## 2015-04-20 DIAGNOSIS — E1165 Type 2 diabetes mellitus with hyperglycemia: Secondary | ICD-10-CM | POA: Diagnosis present

## 2015-04-20 LAB — COMPREHENSIVE METABOLIC PANEL
ALK PHOS: 74 U/L (ref 38–126)
ALT: 14 U/L (ref 14–54)
AST: 22 U/L (ref 15–41)
Albumin: 3.4 g/dL — ABNORMAL LOW (ref 3.5–5.0)
Anion gap: 13 (ref 5–15)
BUN: 8 mg/dL (ref 6–20)
CO2: 23 mmol/L (ref 22–32)
Calcium: 8 mg/dL — ABNORMAL LOW (ref 8.9–10.3)
Chloride: 99 mmol/L — ABNORMAL LOW (ref 101–111)
Creatinine, Ser: 0.63 mg/dL (ref 0.44–1.00)
GFR calc Af Amer: >60 mL/min (ref >60)
GFR calc non Af Amer: >60 mL/min (ref >60)
GLUCOSE: 170 mg/dL — AB (ref 65–99)
POTASSIUM: 4 mmol/L (ref 3.5–5.1)
SODIUM: 135 mmol/L (ref 135–145)
Total Bilirubin: 0.6 mg/dL (ref 0.3–1.2)
Total Protein: 6.7 g/dL (ref 6.5–8.1)

## 2015-04-20 LAB — I-STAT TROPONIN, ED
Troponin i, poc: 0 ng/mL (ref 0.00–0.08)
Troponin i, poc: 0.01 ng/mL (ref 0.00–0.08)

## 2015-04-20 LAB — URINALYSIS, ROUTINE W REFLEX MICROSCOPIC
Bilirubin Urine: NEGATIVE
GLUCOSE, UA: NEGATIVE mg/dL
Hgb urine dipstick: NEGATIVE
KETONES UR: NEGATIVE mg/dL
Leukocytes, UA: NEGATIVE
Nitrite: NEGATIVE
Protein, ur: NEGATIVE mg/dL
SPECIFIC GRAVITY, URINE: 1.011 (ref 1.005–1.030)
UROBILINOGEN UA: 0.2 mg/dL (ref 0.0–1.0)
pH: 6 (ref 5.0–8.0)

## 2015-04-20 LAB — CBC
HEMATOCRIT: 30.6 % — AB (ref 36.0–46.0)
HEMOGLOBIN: 9.6 g/dL — AB (ref 12.0–15.0)
MCH: 29.2 pg (ref 26.0–34.0)
MCHC: 31.4 g/dL (ref 30.0–36.0)
MCV: 93 fL (ref 78.0–100.0)
Platelets: 136 10*3/uL — ABNORMAL LOW (ref 150–400)
RBC: 3.29 MIL/uL — AB (ref 3.87–5.11)
RDW: 20.2 % — ABNORMAL HIGH (ref 11.5–15.5)
WBC: 7.2 10*3/uL (ref 4.0–10.5)

## 2015-04-20 LAB — BRAIN NATRIURETIC PEPTIDE: B Natriuretic Peptide: 32.5 pg/mL (ref 0.0–100.0)

## 2015-04-20 LAB — I-STAT CG4 LACTIC ACID, ED
Lactic Acid, Venous: 1.28 mmol/L (ref 0.5–2.0)
Lactic Acid, Venous: 0.91 mmol/L (ref 0.5–2.0)

## 2015-04-20 LAB — GLUCOSE, CAPILLARY
GLUCOSE-CAPILLARY: 278 mg/dL — AB (ref 65–99)
Glucose-Capillary: 272 mg/dL — ABNORMAL HIGH (ref 65–99)
Glucose-Capillary: 266 mg/dL — ABNORMAL HIGH (ref 65–99)

## 2015-04-20 LAB — TROPONIN I

## 2015-04-20 MED ORDER — INSULIN ASPART 100 UNIT/ML ~~LOC~~ SOLN
0.0000 [IU] | Freq: Every day | SUBCUTANEOUS | Status: DC
  Administered 2015-04-20: 3 [IU] via SUBCUTANEOUS

## 2015-04-20 MED ORDER — VITAMIN D 1000 UNITS PO TABS
1000.0000 [IU] | ORAL_TABLET | Freq: Every morning | ORAL | Status: DC
  Administered 2015-04-20 – 2015-04-26 (×7): 1000 [IU] via ORAL
  Filled 2015-04-20 (×7): qty 1

## 2015-04-20 MED ORDER — ATORVASTATIN CALCIUM 40 MG PO TABS
80.0000 mg | ORAL_TABLET | Freq: Every day | ORAL | Status: DC
  Administered 2015-04-20 – 2015-04-25 (×5): 80 mg via ORAL
  Filled 2015-04-20 (×6): qty 2

## 2015-04-20 MED ORDER — IPRATROPIUM-ALBUTEROL 0.5-2.5 (3) MG/3ML IN SOLN
3.0000 mL | Freq: Once | RESPIRATORY_TRACT | Status: AC
  Administered 2015-04-20: 3 mL via RESPIRATORY_TRACT
  Filled 2015-04-20: qty 3

## 2015-04-20 MED ORDER — CETYLPYRIDINIUM CHLORIDE 0.05 % MT LIQD
7.0000 mL | Freq: Two times a day (BID) | OROMUCOSAL | Status: DC
  Administered 2015-04-20 – 2015-04-26 (×11): 7 mL via OROMUCOSAL

## 2015-04-20 MED ORDER — PROCHLORPERAZINE MALEATE 10 MG PO TABS
10.0000 mg | ORAL_TABLET | Freq: Four times a day (QID) | ORAL | Status: DC | PRN
  Administered 2015-04-20: 10 mg via ORAL
  Filled 2015-04-20: qty 1

## 2015-04-20 MED ORDER — ONDANSETRON HCL 4 MG/2ML IJ SOLN
4.0000 mg | Freq: Four times a day (QID) | INTRAMUSCULAR | Status: DC | PRN
  Administered 2015-04-20 – 2015-04-26 (×6): 4 mg via INTRAVENOUS
  Filled 2015-04-20 (×7): qty 2

## 2015-04-20 MED ORDER — LEVOTHYROXINE SODIUM 25 MCG PO TABS
137.0000 ug | ORAL_TABLET | Freq: Every day | ORAL | Status: DC
  Administered 2015-04-20 – 2015-04-26 (×7): 137 ug via ORAL
  Filled 2015-04-20 (×14): qty 1

## 2015-04-20 MED ORDER — LORAZEPAM 1 MG PO TABS
1.0000 mg | ORAL_TABLET | Freq: Three times a day (TID) | ORAL | Status: DC | PRN
  Administered 2015-04-21 – 2015-04-25 (×6): 1 mg via ORAL
  Filled 2015-04-20 (×6): qty 1

## 2015-04-20 MED ORDER — INSULIN ASPART 100 UNIT/ML ~~LOC~~ SOLN
0.0000 [IU] | Freq: Three times a day (TID) | SUBCUTANEOUS | Status: DC
  Administered 2015-04-20 (×2): 5 [IU] via SUBCUTANEOUS
  Administered 2015-04-21: 2 [IU] via SUBCUTANEOUS

## 2015-04-20 MED ORDER — ALUM & MAG HYDROXIDE-SIMETH 200-200-20 MG/5ML PO SUSP: 15.0000 mL | ORAL | Status: DC | PRN

## 2015-04-20 MED ORDER — BENZONATATE 100 MG PO CAPS
100.0000 mg | ORAL_CAPSULE | Freq: Three times a day (TID) | ORAL | Status: DC
  Administered 2015-04-20 – 2015-04-26 (×20): 100 mg via ORAL
  Filled 2015-04-20 (×20): qty 1

## 2015-04-20 MED ORDER — NITROGLYCERIN 0.4 MG SL SUBL: 0.4000 mg | SUBLINGUAL_TABLET | SUBLINGUAL | Status: DC | PRN

## 2015-04-20 MED ORDER — IOHEXOL 350 MG/ML SOLN
100.0000 mL | Freq: Once | INTRAVENOUS | Status: AC | PRN
  Administered 2015-04-20: 100 mL via INTRAVENOUS

## 2015-04-20 MED ORDER — ACETAMINOPHEN 325 MG PO TABS: 650.0000 mg | ORAL_TABLET | Freq: Four times a day (QID) | ORAL | Status: DC | PRN

## 2015-04-20 MED ORDER — SODIUM CHLORIDE 0.9 % IJ SOLN
3.0000 mL | Freq: Two times a day (BID) | INTRAMUSCULAR | Status: DC
  Administered 2015-04-23 – 2015-04-24 (×3): 3 mL via INTRAVENOUS

## 2015-04-20 MED ORDER — MAGNESIUM GLUCONATE 500 MG PO TABS
500.0000 mg | ORAL_TABLET | Freq: Every morning | ORAL | Status: DC
  Administered 2015-04-20 – 2015-04-26 (×7): 500 mg via ORAL
  Filled 2015-04-20 (×7): qty 1

## 2015-04-20 MED ORDER — IPRATROPIUM-ALBUTEROL 0.5-2.5 (3) MG/3ML IN SOLN
3.0000 mL | Freq: Four times a day (QID) | RESPIRATORY_TRACT | Status: DC
  Administered 2015-04-20 – 2015-04-24 (×14): 3 mL via RESPIRATORY_TRACT
  Filled 2015-04-20 (×17): qty 3

## 2015-04-20 MED ORDER — MORPHINE SULFATE 4 MG/ML IJ SOLN
4.0000 mg | Freq: Once | INTRAMUSCULAR | Status: AC
  Administered 2015-04-20: 4 mg via INTRAVENOUS
  Filled 2015-04-20: qty 1

## 2015-04-20 MED ORDER — LEVOFLOXACIN IN D5W 750 MG/150ML IV SOLN
750.0000 mg | Freq: Once | INTRAVENOUS | Status: AC
  Administered 2015-04-20: 750 mg via INTRAVENOUS
  Filled 2015-04-20: qty 150

## 2015-04-20 MED ORDER — VITAMIN B-12 1000 MCG PO TABS
1000.0000 ug | ORAL_TABLET | Freq: Every morning | ORAL | Status: DC
  Administered 2015-04-20 – 2015-04-26 (×7): 1000 ug via ORAL
  Filled 2015-04-20 (×7): qty 1

## 2015-04-20 MED ORDER — SPIRONOLACTONE 25 MG PO TABS
25.0000 mg | ORAL_TABLET | Freq: Every morning | ORAL | Status: DC
  Administered 2015-04-20 – 2015-04-21 (×2): 25 mg via ORAL
  Filled 2015-04-20 (×2): qty 1

## 2015-04-20 MED ORDER — SERTRALINE HCL 50 MG PO TABS
150.0000 mg | ORAL_TABLET | Freq: Every day | ORAL | Status: DC
  Administered 2015-04-20 – 2015-04-25 (×6): 150 mg via ORAL
  Filled 2015-04-20 (×12): qty 1

## 2015-04-20 MED ORDER — METHYLPREDNISOLONE SODIUM SUCC 125 MG IJ SOLR: 125.0000 mg | Freq: Once | INTRAMUSCULAR | Status: DC

## 2015-04-20 MED ORDER — OXYCODONE HCL 5 MG PO TABS
5.0000 mg | ORAL_TABLET | ORAL | Status: DC | PRN
  Administered 2015-04-20 – 2015-04-25 (×10): 5 mg via ORAL
  Filled 2015-04-20 (×10): qty 1

## 2015-04-20 MED ORDER — GUAIFENESIN-DM 100-10 MG/5ML PO SYRP
5.0000 mL | ORAL_SOLUTION | ORAL | Status: DC | PRN
Start: 2015-04-20 — End: 2015-04-26

## 2015-04-20 MED ORDER — ALBUTEROL SULFATE (2.5 MG/3ML) 0.083% IN NEBU
2.5000 mg | INHALATION_SOLUTION | RESPIRATORY_TRACT | Status: DC | PRN
  Administered 2015-04-23: 2.5 mg via RESPIRATORY_TRACT
  Filled 2015-04-20: qty 3

## 2015-04-20 MED ORDER — ONDANSETRON HCL 4 MG/2ML IJ SOLN
4.0000 mg | Freq: Once | INTRAMUSCULAR | Status: AC
  Administered 2015-04-20: 4 mg via INTRAVENOUS
  Filled 2015-04-20: qty 2

## 2015-04-20 MED ORDER — METHYLPREDNISOLONE SODIUM SUCC 125 MG IJ SOLR
60.0000 mg | Freq: Two times a day (BID) | INTRAMUSCULAR | Status: DC
  Administered 2015-04-20 – 2015-04-26 (×13): 60 mg via INTRAVENOUS
  Filled 2015-04-20 (×13): qty 2

## 2015-04-20 MED ORDER — ASPIRIN 81 MG PO CHEW
81.0000 mg | CHEWABLE_TABLET | Freq: Every day | ORAL | Status: DC
  Administered 2015-04-20 – 2015-04-26 (×7): 81 mg via ORAL
  Filled 2015-04-20 (×7): qty 1

## 2015-04-20 MED ORDER — ENOXAPARIN SODIUM 120 MG/0.8ML ~~LOC~~ SOLN
120.0000 mg | Freq: Every day | SUBCUTANEOUS | Status: DC
  Administered 2015-04-20 – 2015-04-25 (×6): 120 mg via SUBCUTANEOUS
  Filled 2015-04-20 (×7): qty 0.8

## 2015-04-20 MED ORDER — BUDESONIDE-FORMOTEROL FUMARATE 160-4.5 MCG/ACT IN AERO
2.0000 | INHALATION_SPRAY | Freq: Two times a day (BID) | RESPIRATORY_TRACT | Status: DC
  Administered 2015-04-20 – 2015-04-26 (×12): 2 via RESPIRATORY_TRACT
  Filled 2015-04-20: qty 6

## 2015-04-20 MED ORDER — LEVOFLOXACIN IN D5W 500 MG/100ML IV SOLN
500.0000 mg | INTRAVENOUS | Status: DC
  Administered 2015-04-21: 500 mg via INTRAVENOUS
  Filled 2015-04-20: qty 100

## 2015-04-20 MED ORDER — SODIUM CHLORIDE 0.9 % IV BOLUS (SEPSIS)
1000.0000 mL | Freq: Once | INTRAVENOUS | Status: AC
  Administered 2015-04-20: 1000 mL via INTRAVENOUS

## 2015-04-20 MED ORDER — ENSURE ENLIVE PO LIQD
237.0000 mL | Freq: Two times a day (BID) | ORAL | Status: DC
Start: 2015-04-20 — End: 2015-04-26
  Administered 2015-04-20 – 2015-04-26 (×11): 237 mL via ORAL

## 2015-04-20 MED ORDER — OXYCODONE HCL 5 MG PO TABS
5.0000 mg | ORAL_TABLET | ORAL | Status: DC | PRN
  Administered 2015-04-20: 5 mg via ORAL
  Filled 2015-04-20: qty 1

## 2015-04-20 MED ORDER — ADULT MULTIVITAMIN W/MINERALS CH
1.0000 | ORAL_TABLET | Freq: Every morning | ORAL | Status: DC
  Administered 2015-04-20 – 2015-04-26 (×7): 1 via ORAL
  Filled 2015-04-20 (×7): qty 1

## 2015-04-20 NOTE — ED Notes (Signed)
She is recently back from CT and is awake and in no distress.  I have just notified R.T. To please perform peak flow measurement.  Pt. Has no requests at this time.

## 2015-04-20 NOTE — ED Notes (Signed)
Bed: VG86 Expected date:  Expected time:  Means of arrival:  Comments: 59 yo F  Shortness of breath

## 2015-04-20 NOTE — Progress Notes (Signed)
[facility] CONSULT NOTE - INITIAL  Pharmacy Consult for Levaquin Indication: COPD Exacerbation  Allergies  Allergen Reactions  . Carboplatin Rash    Skin test reaction  . Sulfonamide Derivatives Rash  . Codeine Other (See Comments)    hallucinations  . Dilaudid [Hydromorphone Hcl] Nausea And Vomiting and Other (See Comments)    Room started spinning.  Pt has been okay with low doses and nausea meds administered first  . Penicillins Other (See Comments)    Childhood allergy; reaction unknown  . Vicodin [Hydrocodone-Acetaminophen] Rash    Patient Measurements: Height: '5\' 8"'$  (172.7 cm) Weight: 173 lb 4.5 oz (78.6 kg) IBW/kg (Calculated) : 63.9   Vital Signs: Temp: 97.7 F (36.5 C) (07/03 1036) Temp Source: Oral (07/03 1036) BP: 139/77 mmHg (07/03 1036) Pulse Rate: 104 (07/03 1036) Intake/Output from previous day:   Intake/Output from this shift:    Labs:  Recent Labs  04/17/15 1134 04/17/15 1134 04/20/15 0340  WBC 4.9  --  7.2  HGB 9.4*  --  9.6*  PLT 80*  --  136*  CREATININE  --  0.7 0.63   Estimated Creatinine Clearance: 82.4 mL/min (by C-G formula based on Cr of 0.63). No results for input(s): VANCOTROUGH, VANCOPEAK, VANCORANDOM, GENTTROUGH, GENTPEAK, GENTRANDOM, TOBRATROUGH, TOBRAPEAK, TOBRARND, AMIKACINPEAK, AMIKACINTROU, AMIKACIN in the last 72 hours.   Microbiology: Recent Results (from the past 720 hour(s))  TECHNOLOGIST REVIEW     Status: None   Collection Time: 03/27/15  2:25 PM  Result Value Ref Range Status   Technologist Review Occ Metas and Myelocytes present  Final  TECHNOLOGIST REVIEW     Status: None   Collection Time: 04/17/15 11:34 AM  Result Value Ref Range Status   Technologist Review Metas and Myelocytes present  Final    Medical History: Past Medical History  Diagnosis Date  . Anxiety   . Hyperlipidemia   . Hypothyroidism   . COPD (chronic obstructive pulmonary disease)   . Arthritis     thumbs  . Radiation 06/30/09-08/15/09     squamous cell lung  . 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 Lung cancer dx'd 05/2009  . Lung cancer dx'd 09/2013    recurrence in LN  . Diabetes mellitus     Medications:  Scheduled:  . antiseptic oral rinse  7 mL Mouth Rinse BID  . aspirin  81 mg Oral Daily  . atorvastatin  80 mg Oral q1800  . benzonatate  100 mg Oral TID  . budesonide-formoterol  2 puff Inhalation BID  . cholecalciferol  1,000 Units Oral q morning - 10a  . enoxaparin  120 mg Subcutaneous Daily  . feeding supplement (ENSURE ENLIVE)  237 mL Oral BID BM  . insulin aspart  0-5 Units Subcutaneous QHS  . insulin aspart  0-9 Units Subcutaneous TID WC  . ipratropium-albuterol  3 mL Nebulization QID  . levofloxacin (LEVAQUIN) IV  750 mg Intravenous Once  . levothyroxine  137 mcg Oral Daily  . magnesium gluconate  500 mg Oral q morning - 10a  . methylPREDNISolone (SOLU-MEDROL) injection  60 mg Intravenous BID  . multivitamin with minerals  1 tablet Oral q morning - 10a  . sertraline  150 mg Oral QHS  . sodium chloride  3 mL Intravenous Q12H  . spironolactone  25 mg Oral q morning - 10a  . cyanocobalamin  1,000 mcg Oral q morning - 10a   Infusions:   PRN: acetaminophen, albuterol, alum &  mag hydroxide-simeth, guaiFENesin-dextromethorphan, LORazepam, nitroGLYCERIN, oxyCODONE, prochlorperazine Assessment: 1yoF with h/o of COPD and recurrent non-small cell lung CA, currently on gemcitabine and in the ER due to hypoxia to the mid 80's. Levaquin to be given for COPD exacerbation.  Goal of Therapy:  Eradication of infection  Plan:  Levaquin '500mg'$  IV daily starting tomorrow as patient received a dose in the ER. Will f/u Scr and adjust dose as needed.   Garnet Sierras 04/20/2015,10:51 AM

## 2015-04-20 NOTE — Progress Notes (Signed)
Peak flow was 120. Pt had just come back from walking.

## 2015-04-20 NOTE — ED Notes (Signed)
Nurse will get [employer]

## 2015-04-20 NOTE — H&P (Signed)
[facility] History and Physical  Janice Ball CBS:[id number] DOB: [birth year] DOA: 04/20/2015  Referring physician:  Clayton Bibles PCP:  [name], NP   Chief Complaint:  SOB, chest pain  HPI:  The patient is a 59 y.o. 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  She states that she developed an increased cough, nonproductive over the last 3 days. She had worsening dyspnea on exertion and finally at rest which prompted her to come to the emergency department. She has felt tight in her chest and wheezy. Separate from the tightness she has had the sensation of a brick sitting on the middle upper chest which is heavy and associated with some increased shortness of breath.  She does have nausea but this occurs both with and without the chest pain. Her episodes of chest pressure are moderate in severity and last about 30 minutes and resolve on their own. They occur both with and without exertion. They have been happening multiple times per day. She had similar symptoms in January of this year at which time she had an echocardiogram which demonstrated no wall motion abnormalities. She has risk factors of hyperlipidemia, previous smoking, diabetes, and radiation to the chest. She denies fevers, but has had some chills. She has had nausea without vomiting and denies changes to her stools. She denies constipation and diarrhea. She denies dysuria.  In the ER, she was hypoxic to the mid-80s with trying to get to the edge of the stretcher to start ambulating, tachycardic to 110s.  Stable anemia.  Hyperglycemia.  CXR:  No acute changes, stable spiculated right upper lobe mass.  CT angio chest, no PE, stable RUL soft tissue density and trace right pleural effusion.  She was given solumedrol, duoneb, levofloxacin and NS bolus in ER.   She states that the breathing treatments helped her chest wheeze but did not affect the pressure that she feels in her chest. Additionally, she had some chest pains.  ECG demonstrated stable sinus tachycardic without ischemic changes and troponin was negative.    Review of Systems:  General:  Denies fevers, positive chills, denies weight loss or gain HEENT:  Denies changes to hearing and vision, rhinorrhea, sinus congestion, sore throat CV:  Per history of present illness. Denies increased lower extremity edema, leg swelling PULM:  Per history of present illness.   GI:  Denies vomiting, constipation, diarrhea.   GU:  Denies dysuria, frequency, urgency ENDO:  Denies polyuria, polydipsia.   HEME:  Denies hematemesis, blood in stools, melena, abnormal bruising or bleeding.  LYMPH:  Denies lymphadenopathy.   MSK:  Denies arthralgias, myalgias.   DERM:  Denies skin rash or ulcer.   NEURO:  Denies focal numbness, weakness, slurred speech, confusion, facial droop.  PSYCH:  Denies anxiety and depression.    Past Medical History  Diagnosis Date  . Anxiety   . Hyperlipidemia   . Hypothyroidism   . COPD (chronic obstructive pulmonary disease)   . Arthritis     thumbs  . 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 Lung cancer dx'd 05/2009  . Lung cancer dx'd 09/2013    recurrence in LN  . Diabetes mellitus    Past Surgical History  Procedure Laterality Date  . Thyroidectomy, partial    .  Tonsillectomy    . Tubal ligation    . Partial hysterectomy     Social History:  reports that she quit smoking about 5 months ago. Her smoking use included Cigarettes. She has a 22.5 pack-year smoking history. She has never used smokeless tobacco. She reports that she does not drink alcohol or use illicit drugs.  Allergies  Allergen Reactions  . Carboplatin Rash    Skin test reaction  . Sulfonamide  Derivatives Rash  . Codeine Other (See Comments)    hallucinations  . Dilaudid [Hydromorphone Hcl] Nausea And Vomiting and Other (See Comments)    Room started spinning.  Pt has been okay with low doses and nausea meds administered first  . Penicillins Other (See Comments)    Childhood allergy; reaction unknown  . Vicodin [Hydrocodone-Acetaminophen] Rash    Family History  Problem Relation Age of Onset  . Colon cancer Mother   . Cancer Mother     colon  . Diabetes type II Father      Prior to Admission medications   Medication Sig Start Date End Date Taking? Authorizing Provider  acetaminophen (TYLENOL) 325 MG tablet Take 650 mg by mouth every 6 (six) hours as needed for mild pain or headache.    Yes Historical Provider, MD  alendronate (FOSAMAX) 10 MG tablet Take 10 mg by mouth daily.  03/20/14  Yes Historical Provider, MD  budesonide-formoterol (SYMBICORT) 160-4.5 MCG/ACT inhaler Inhale 2 puffs into the lungs 2 (two) times daily.   Yes Historical Provider, MD  cholecalciferol (VITAMIN D) 1000 UNITS tablet Take 1,000 Units by mouth every morning.    Yes Historical Provider, MD  [employer] ASPIRIN ADULT LOW DOSE 81 MG chewable tablet Chew 81 mg by mouth daily. 12/06/14  Yes Historical Provider, MD  cyanocobalamin 1000 MCG tablet Take 1,000 mcg by mouth every morning.    Yes Historical Provider, MD  enoxaparin (LOVENOX) 120 MG/0.8ML injection Inject 0.8 mLs (120 mg total) into the skin daily. 02/20/15  Yes Curt Bears, MD  glimepiride (AMARYL) 2 MG tablet Take 2 mg by mouth daily with breakfast.  12/21/13  Yes Historical Provider, MD  guaiFENesin (ROBITUSSIN) 100 MG/5ML SOLN Take 5-10 mLs (100-200 mg total) by mouth every 4 (four) hours as needed for cough. 01/24/15  Yes Ripudeep K Rai, MD  ipratropium (ATROVENT) 0.02 % nebulizer solution Take 2.5 mLs (0.5 mg total) by nebulization 4 (four) times daily. 03/05/14  Yes Brand Males, MD  levothyroxine (SYNTHROID, LEVOTHROID) 137 MCG tablet Take 137  mcg by mouth daily.  01/15/15  Yes Historical Provider, MD  LORazepam (ATIVAN) 1 MG tablet Take 1 mg by mouth 3 (three) times daily as needed for anxiety.    Yes Historical Provider, MD  magnesium gluconate (MAGONATE) 500 MG tablet Take 500 mg by mouth every morning.    Yes Historical Provider, MD  metFORMIN (GLUCOPHAGE) 1000 MG tablet Take 1,000 mg by mouth 2 (two) times daily with a meal.   Yes Historical Provider, MD  Multiple Vitamin (MULTIVITAMIN WITH MINERALS) TABS tablet Take 1 tablet by mouth every morning.   Yes Historical Provider, MD  OVER THE COUNTER MEDICATION Take 1 capsule by mouth daily. MSM. For energy and joint pain   Yes Historical Provider, MD  oxyCODONE (OXY IR/ROXICODONE) 5 MG immediate release tablet Take 1 tablet (5 mg total) by mouth every 4 (four) hours as needed for moderate pain. 04/03/15  Yes Adrena E Johnson, PA-C  PROAIR HFA 108 (90 BASE) MCG/ACT inhaler INHALE 2 PUFFS  INTO THE LUNGS EVERY 6 (SIX) HOURS AS NEEDED FOR WHEEZING OR SHORTNESS OF BREATH. 03/27/15  Yes Brand Males, MD  prochlorperazine (COMPAZINE) 10 MG tablet Take 1 tablet (10 mg total) by mouth every 6 (six) hours as needed for nausea or vomiting. 04/03/15  Yes Adrena E Johnson, PA-C  sertraline (ZOLOFT) 100 MG tablet Take 150 mg by mouth at bedtime.    Yes Historical Provider, MD  spironolactone (ALDACTONE) 25 MG tablet Take 25 mg by mouth every morning.    Yes Historical Provider, MD  lidocaine-prilocaine (EMLA) cream Apply 1 application topically as needed. Apply to port 1 hr before chemo Patient taking differently: Apply 1 application topically daily as needed. Apply to port 1 hr before chemo 11/06/14   Curt Bears, MD   Physical Exam: Filed Vitals:   04/20/15 0630 04/20/15 0749 04/20/15 0907 04/20/15 0908  BP: 143/74 125/59 122/69   Pulse: 112 114 109   Temp:  99.8 F (37.7 C)    TempSrc:  Rectal    Resp: '19 22 22   '$ SpO2: 97% 96% 88% 97%     General:  Adult female, no acute distress,  nasal cannula in place  Eyes:  PERRL, anicteric, non-injected.  ENT:  Nares clear.  OP clear, non-erythematous without plaques or exudates.  MMM.  Neck:  Supple without TM or JVD.    Lymph:  No cervical, supraclavicular, or submandibular LAD.  Cardiovascular:  RRR, normal S1, S2, without m/r/g.  2+ pulses, warm extremities  Respiratory:  Rales at the bilateral bases, no wheezes, rhonchi, without increased WOB.  Abdomen:  NABS.  Soft, ND/NT.    Skin:  No rashes or focal lesions.  Musculoskeletal:  Normal bulk and tone.  No LE edema.  Psychiatric:  A & O x 4.  Appropriate affect.  Neurologic:  CN 3-12 intact.  5/5 strength.  Sensation intact.  Labs on Admission:  Basic Metabolic Panel:  Recent Labs Lab 04/17/15 1134 04/20/15 0340  NA 138 135  K 4.2 4.0  CL  --  99*  CO2 25 23  GLUCOSE 202* 170*  BUN 4.8* 8  CREATININE 0.7 0.63  CALCIUM 8.3* 8.0*   Liver Function Tests:  Recent Labs Lab 04/17/15 1134 04/20/15 0340  AST 21 22  ALT 15 14  ALKPHOS 74 74  BILITOT 0.39 0.6  PROT 6.3* 6.7  ALBUMIN 3.0* 3.4*   No results for input(s): LIPASE, AMYLASE in the last 168 hours. No results for input(s): AMMONIA in the last 168 hours. CBC:  Recent Labs Lab 04/17/15 1134 04/20/15 0340  WBC 4.9 7.2  NEUTROABS 3.8  --   HGB 9.4* 9.6*  HCT 29.6* 30.6*  MCV 91.1 93.0  PLT 80* 136*   Cardiac Enzymes: No results for input(s): CKTOTAL, CKMB, CKMBINDEX, TROPONINI in the last 168 hours.  BNP (last 3 results)  Recent Labs  11/03/14 1222 11/30/14 1450 04/20/15 0340  BNP 22.1 29.9 32.5    ProBNP (last 3 results) No results for input(s): PROBNP in the last 8760 hours.  CBG: No results for input(s): GLUCAP in the last 168 hours.  Radiological Exams on Admission: Dg Chest 2 View  04/20/2015   CLINICAL DATA:  Increasing dyspnea.  Known lung cancer.  EXAM: CHEST  2 VIEW  COMPARISON:  01/18/2015  FINDINGS: There is a right jugular Port-A-Cath with tip in the low  SVC. There is an SVC stent. There is a spiculated mass in the medial right upper lobe, not significantly changed. No  superimposed acute infiltrate or congestive failure is evident. There are no effusions.  IMPRESSION: No acute findings.  Unchanged spiculated right upper lobe mass.   Electronically Signed   By: Andreas Newport M.D.   On: 04/20/2015 03:49   Ct Angio Chest Pe W/cm &/or Wo Cm  04/20/2015   CLINICAL DATA:  Shortness of Breath, tachycardia. Lung disease. Ongoing chemotherapy.  EXAM: CT ANGIOGRAPHY CHEST WITH CONTRAST  TECHNIQUE: Multidetector CT imaging of the chest was performed using the standard protocol during bolus administration of intravenous contrast. Multiplanar CT image reconstructions and MIPs were obtained to evaluate the vascular anatomy.  CONTRAST:  130m OMNIPAQUE IOHEXOL 350 MG/ML SOLN  COMPARISON:  02/17/2015  FINDINGS: No filling defects in the pulmonary arteries to suggest pulmonary emboli. Heart is normal size. Aortic atherosclerosis without aneurysm or dissection. SVC remains patent.  Abnormal soft tissue in the mediastinum and right paratracheal region, surrounding the trachea and right mainstem bronchus extending into the right hilum again noted and stable. Airspace opacity in the right upper lobe adjacent to the right hilum is again noted and is similar to prior study, likely radiation pneumonitis or fibrosis. Trace right pleural effusion. Stable small nodules in the left upper lobe. Otherwise, Left lung is clear.  Right chest wall Port-A-Cath remains in place, unchanged. Imaging into the upper abdomen shows no acute findings.  No acute bony abnormality or focal bone lesion. Collateral venous channels are noted throughout the chest wall and mediastinum likely related to prior SVC occlusion. Again, SVC appears patent currently.  Review of the MIP images confirms the above findings.  IMPRESSION: No evidence of pulmonary embolus.  Stable appearance of the confluent mediastinal and  right perihilar/suprahilar soft tissue density.  Trace right effusion.   Electronically Signed   By: KRolm BaptiseM.D.   On: 04/20/2015 07:30    EKG: sinus tachycardic without ischemic changes  Assessment/Plan Active Problems:   Chest pain  ---  Acute hypoxic respiratory failure likely secondary to acute COPD exacerbation.   -  Solumedrol  -  Continue symbicort -  Duoneb q4h with albuterol q2h prn -  Levofloxacin  -  Wean oxygen as tolerated -  IS/flutter valve  Chest pain:  Differential includes ACS, GERD, MSK pain.  History, ECG, Age (0-45, 483-64 >=65), Risk factors (CAD, CVD, PVD, HLD, HTN, DM, tobacco, FH, obesity), Troponin.  HEART Score = 2.  Most likely she is having some MSK pain related to COPD exacerbation in the setting of malignancy. -  Telemetry -  Cycle troponins and ECG -  A1c & lipid panel -  Daily aspirin -  Beta blocker and high dose statin -  NTG prn chest pain -  Recommended outpatient stress test  SVC syndrome -  Continue lovenox  Diabetes mellitus type 2 with hyperglycemia -  A1c  -  Hold oral medications -  Low dose SSI and HS insulin  Normocytic anemia due to malignancy and chemotherapy, followed by Hematology -  Trend hgb -  Transfuse for hgb < 7 or symptomatic anemia  Recurrent non-small cell lung CA, squamous cell, currently on gemcitabine -  Dr. MJulien Nordmannadded to rounding list  Diet:  diabetic Access:  PIV IVF:  off Proph:  lovenox (therapeutic)  Code Status: full Family Communication: patient alone Disposition Plan: Admit to telemetry  Time spent: 60 min SJanece CanterburyTri[facility] Hospitalists Pager [phone] If 7PM-7AM, please contact night-coverage [email or web address] Password [email or web address] 04/20/2015, 10:05 AM

## 2015-04-20 NOTE — ED Notes (Signed)
Patient transported to X-ray 

## 2015-04-20 NOTE — ED Provider Notes (Signed)
CSN: [id number]     Arrival date & time 04/20/15  0309 History   First MD Initiated Contact with Patient 04/20/15 [phone]     Chief Complaint  Patient presents with  . Shortness of Breath     (Consider location/radiation/quality/duration/timing/severity/associated sxs/prior Treatment) The history is provided by the patient.  ]  Pt with hx COPD, lung CA on chemotherapy (last 11 days ago), prior radiation therapy p/w SOB, chest pain that began yesterday.  The SOB has been constant.  Chest pressure has been intermittent, lasts 5-10 minutes at a time, unrelated to activity or to the SOB.  No exacerbating or palliative factors.  Associated lightheadedness, nausea, sweats, and chills.  Has had bilateral ankle swelling.   She has hx multiple clots, in her arm and her SVC, now on Lovenox.  There has been no change in her chemotherapy medications.  She has been doing chemo off and on for 6 years.  Onc Dr Earlie Server PCP [name] Pulm Dr Chase Caller   Past Medical History  Diagnosis Date  . Anxiety   . Hyperlipidemia   . Hypothyroidism   . COPD (chronic obstructive pulmonary disease)   . Arthritis     thumbs  . 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 Lung cancer dx'd 05/2009  . Lung cancer dx'd 09/2013    recurrence in LN  . Diabetes mellitus    Past Surgical History  Procedure Laterality Date  . Thyroidectomy, partial    . Tonsillectomy    . Tubal ligation    . Partial hysterectomy     Family History  Problem Relation Age of Onset  . Colon cancer Mother   . Cancer Mother     colon  . Diabetes type II Father    History  Substance Use Topics  . Smoking status: Former Smoker -- 0.50 packs/day for 45 years    Types: Cigarettes    Quit date: 11/03/2014  . Smokeless tobacco: Never Used     Comment: pt. started back smoking 10/18/2012, half a pack a day  . Alcohol Use: No   OB  History    No data available     Review of Systems  All other systems reviewed and are negative.     Allergies  Carboplatin; Sulfonamide derivatives; Codeine; Dilaudid; Penicillins; and Vicodin  Home Medications   Prior to Admission medications   Medication Sig Start Date End Date Taking? Authorizing Provider  acetaminophen (TYLENOL) 325 MG tablet Take 650 mg by mouth every 6 (six) hours as needed for mild pain or headache.    Yes Historical Provider, MD  alendronate (FOSAMAX) 10 MG tablet Take 10 mg by mouth daily.  03/20/14  Yes Historical Provider, MD  budesonide-formoterol (SYMBICORT) 160-4.5 MCG/ACT inhaler Inhale 2 puffs into the lungs 2 (two) times daily.   Yes Historical Provider, MD  cholecalciferol (VITAMIN D) 1000 UNITS tablet Take 1,000 Units by mouth every morning.    Yes Historical Provider, MD  [employer] ASPIRIN ADULT LOW DOSE 81 MG chewable tablet Chew 81 mg by mouth daily. 12/06/14  Yes Historical Provider, MD  cyanocobalamin 1000 MCG tablet Take 1,000 mcg by mouth every morning.    Yes Historical Provider, MD  enoxaparin (LOVENOX) 120 MG/0.8ML injection Inject 0.8 mLs (120 mg total) into the skin daily. 02/20/15  Yes Curt Bears, MD  glimepiride (AMARYL) 2 MG tablet Take 2 mg by mouth  daily with breakfast.  12/21/13  Yes Historical Provider, MD  guaiFENesin (ROBITUSSIN) 100 MG/5ML SOLN Take 5-10 mLs (100-200 mg total) by mouth every 4 (four) hours as needed for cough. 01/24/15  Yes Ripudeep K Rai, MD  ipratropium (ATROVENT) 0.02 % nebulizer solution Take 2.5 mLs (0.5 mg total) by nebulization 4 (four) times daily. 03/05/14  Yes Brand Males, MD  levothyroxine (SYNTHROID, LEVOTHROID) 137 MCG tablet Take 137 mcg by mouth daily.  01/15/15  Yes Historical Provider, MD  LORazepam (ATIVAN) 1 MG tablet Take 1 mg by mouth 3 (three) times daily as needed for anxiety.    Yes Historical Provider, MD  magnesium gluconate (MAGONATE) 500 MG tablet Take 500 mg by mouth every morning.    Yes  Historical Provider, MD  metFORMIN (GLUCOPHAGE) 1000 MG tablet Take 1,000 mg by mouth 2 (two) times daily with a meal.   Yes Historical Provider, MD  Multiple Vitamin (MULTIVITAMIN WITH MINERALS) TABS tablet Take 1 tablet by mouth every morning.   Yes Historical Provider, MD  OVER THE COUNTER MEDICATION Take 1 capsule by mouth daily. MSM. For energy and joint pain   Yes Historical Provider, MD  oxyCODONE (OXY IR/ROXICODONE) 5 MG immediate release tablet Take 1 tablet (5 mg total) by mouth every 4 (four) hours as needed for moderate pain. 04/03/15  Yes Adrena E Johnson, PA-C  PROAIR HFA 108 (90 BASE) MCG/ACT inhaler INHALE 2 PUFFS INTO THE LUNGS EVERY 6 (SIX) HOURS AS NEEDED FOR WHEEZING OR SHORTNESS OF BREATH. 03/27/15  Yes Brand Males, MD  prochlorperazine (COMPAZINE) 10 MG tablet Take 1 tablet (10 mg total) by mouth every 6 (six) hours as needed for nausea or vomiting. 04/03/15  Yes Adrena E Johnson, PA-C  sertraline (ZOLOFT) 100 MG tablet Take 150 mg by mouth at bedtime.    Yes Historical Provider, MD  spironolactone (ALDACTONE) 25 MG tablet Take 25 mg by mouth every morning.    Yes Historical Provider, MD  lidocaine-prilocaine (EMLA) cream Apply 1 application topically as needed. Apply to port 1 hr before chemo Patient taking differently: Apply 1 application topically daily as needed. Apply to port 1 hr before chemo 11/06/14   Curt Bears, MD   BP 143/74 mmHg  Pulse 112  Temp(Src) 98.5 F (36.9 C) (Oral)  Resp 19  SpO2 97% Physical Exam  Constitutional: She appears well-developed and well-nourished. No distress.  HENT:  Head: Normocephalic and atraumatic.  Neck: Neck supple.  Cardiovascular: Regular rhythm.  Tachycardia present.   Pulmonary/Chest: Effort normal. No respiratory distress. She has wheezes (occasional, mild). She has no rales.  On 3L [place]  Abdominal: Soft. She exhibits no distension. There is no tenderness. There is no rebound and no guarding.  Musculoskeletal: She  exhibits no edema.  Neurological: She is alert.  Skin: She is not diaphoretic.  Psychiatric: She has a normal mood and affect. Her behavior is normal.  Nursing note and vitals reviewed.   ED Course  Procedures (including critical care time) Labs Review Labs Reviewed  CBC - Abnormal; Notable for the following:    RBC 3.29 (*)    Hemoglobin 9.6 (*)    HCT 30.6 (*)    RDW 20.2 (*)    Platelets 136 (*)    All other components within normal limits  COMPREHENSIVE METABOLIC PANEL - Abnormal; Notable for the following:    Chloride 99 (*)    Glucose, Bld 170 (*)    Calcium 8.0 (*)    Albumin 3.4 (*)  All other components within normal limits  BRAIN NATRIURETIC PEPTIDE  URINALYSIS, ROUTINE W REFLEX MICROSCOPIC (NOT AT [facility])  Randolm Idol, ED  I-STAT CG4 LACTIC ACID, ED  Randolm Idol, ED    Imaging Review Dg Chest 2 View  04/20/2015   CLINICAL DATA:  Increasing dyspnea.  Known lung cancer.  EXAM: CHEST  2 VIEW  COMPARISON:  01/18/2015  FINDINGS: There is a right jugular Port-A-Cath with tip in the low SVC. There is an SVC stent. There is a spiculated mass in the medial right upper lobe, not significantly changed. No superimposed acute infiltrate or congestive failure is evident. There are no effusions.  IMPRESSION: No acute findings.  Unchanged spiculated right upper lobe mass.   Electronically Signed   By: Andreas Newport M.D.   On: 04/20/2015 03:49     EKG Interpretation   Date/Time:  Sunday April 20 2015 06:32:37 EDT Ventricular Rate:  113 PR Interval:  156 QRS Duration: 82 QT Interval:  352 QTC Calculation: 483 R Axis:   114 Text Interpretation:  Sinus tachycardia Right axis deviation No  significant change was found Confirmed by MOLPUS  MD, Jenny Reichmann ([id number]) on  04/20/2015 6:43:18 AM      MDM   Final diagnoses:  Hypoxia  Chest pain, unspecified chest pain type  SOB (shortness of breath)    Pt with hx COPD, Lung CA, currently on chemotherapy p/w increased SOB  and chest pain since yesterday.  Pt hypoxic on room air, not on O2 at home.   Workup largely unremarkable.  Pt has baseline anemia.  CT angio chest negative.  EKGs nonischemic and repeat troponins negative.  Pt given nebs and steroids with improvement of wheezing, though pt reported she was not feeling better.  Admitted to [facility] for further treatment, Dr Sheran Fava accepting.    Clayton Bibles, PA-C 04/20/15 [place], MD 04/20/15 [phone]

## 2015-04-20 NOTE — ED Notes (Signed)
Per EMS pt. From home with complaint of increasing SOB which started yesterday , pt. Has Lung CA and last chemotherapy on Thursday last week. Denied chest pain but has chronic right sided chest discomfort, PCP already aware. Also reported of nausea but no V/D. Received '125mg'$  IV solumedrol and albuterol  Neb via EMS. Alert and oriented x4. No fever reported.

## 2015-04-21 ENCOUNTER — Other Ambulatory Visit: Payer: Self-pay

## 2015-04-21 DIAGNOSIS — R079 Chest pain, unspecified: Secondary | ICD-10-CM

## 2015-04-21 LAB — GLUCOSE, CAPILLARY
GLUCOSE-CAPILLARY: 198 mg/dL — AB (ref 65–99)
GLUCOSE-CAPILLARY: 261 mg/dL — AB (ref 65–99)
GLUCOSE-CAPILLARY: 237 mg/dL — AB (ref 65–99)
Glucose-Capillary: 279 mg/dL — ABNORMAL HIGH (ref 65–99)

## 2015-04-21 LAB — CBC
HCT: 31 % — ABNORMAL LOW (ref 36.0–46.0)
HEMOGLOBIN: 9.6 g/dL — AB (ref 12.0–15.0)
MCH: 29.2 pg (ref 26.0–34.0)
MCHC: 31 g/dL (ref 30.0–36.0)
MCV: 94.2 fL (ref 78.0–100.0)
PLATELETS: 177 10*3/uL (ref 150–400)
RBC: 3.29 MIL/uL — ABNORMAL LOW (ref 3.87–5.11)
RDW: 20.1 % — ABNORMAL HIGH (ref 11.5–15.5)
WBC: 7.4 10*3/uL (ref 4.0–10.5)

## 2015-04-21 LAB — LIPID PANEL
Cholesterol: 153 mg/dL (ref 0–200)
HDL: 32 mg/dL — ABNORMAL LOW (ref >40)
LDL CALC: 95 mg/dL (ref 0–99)
Total CHOL/HDL Ratio: 4.8 RATIO
Triglycerides: 130 mg/dL (ref <150)
VLDL: 26 mg/dL (ref 0–40)

## 2015-04-21 LAB — BASIC METABOLIC PANEL
Anion gap: 11 (ref 5–15)
BUN: 13 mg/dL (ref 6–20)
CO2: 27 mmol/L (ref 22–32)
CREATININE: 0.61 mg/dL (ref 0.44–1.00)
Calcium: 8.4 mg/dL — ABNORMAL LOW (ref 8.9–10.3)
Chloride: 101 mmol/L (ref 101–111)
GFR calc non Af Amer: >60 mL/min (ref >60)
Glucose, Bld: 208 mg/dL — ABNORMAL HIGH (ref 65–99)
Potassium: 4.6 mmol/L (ref 3.5–5.1)
Sodium: 139 mmol/L (ref 135–145)

## 2015-04-21 MED ORDER — INSULIN ASPART 100 UNIT/ML ~~LOC~~ SOLN
0.0000 [IU] | Freq: Every day | SUBCUTANEOUS | Status: DC
  Administered 2015-04-21: 2 [IU] via SUBCUTANEOUS
  Administered 2015-04-22 – 2015-04-25 (×3): 3 [IU] via SUBCUTANEOUS

## 2015-04-21 MED ORDER — INSULIN ASPART 100 UNIT/ML ~~LOC~~ SOLN
3.0000 [IU] | Freq: Three times a day (TID) | SUBCUTANEOUS | Status: DC
  Administered 2015-04-21: 3 [IU] via SUBCUTANEOUS

## 2015-04-21 MED ORDER — INSULIN ASPART 100 UNIT/ML ~~LOC~~ SOLN
0.0000 [IU] | Freq: Three times a day (TID) | SUBCUTANEOUS | Status: DC
  Administered 2015-04-21: 2 [IU] via SUBCUTANEOUS
  Administered 2015-04-21: 8 [IU] via SUBCUTANEOUS
  Administered 2015-04-22: 2 [IU] via SUBCUTANEOUS
  Administered 2015-04-22: 8 [IU] via SUBCUTANEOUS
  Administered 2015-04-22 – 2015-04-23 (×2): 5 [IU] via SUBCUTANEOUS
  Administered 2015-04-23: 15 [IU] via SUBCUTANEOUS
  Administered 2015-04-23: 10 [IU] via SUBCUTANEOUS
  Administered 2015-04-24 (×2): 15 [IU] via SUBCUTANEOUS
  Administered 2015-04-24: 5 [IU] via SUBCUTANEOUS
  Administered 2015-04-25: 15 [IU] via SUBCUTANEOUS
  Administered 2015-04-25: 8 [IU] via SUBCUTANEOUS
  Administered 2015-04-25: 5 [IU] via SUBCUTANEOUS
  Administered 2015-04-26: 8 [IU] via SUBCUTANEOUS
  Administered 2015-04-26: 5 [IU] via SUBCUTANEOUS

## 2015-04-21 MED ORDER — INSULIN DETEMIR 100 UNIT/ML ~~LOC~~ SOLN
10.0000 [IU] | Freq: Every day | SUBCUTANEOUS | Status: DC
  Administered 2015-04-21: 10 [IU] via SUBCUTANEOUS
  Filled 2015-04-21: qty 0.1

## 2015-04-21 NOTE — Progress Notes (Signed)
[facility] PROGRESS NOTE  Janice Ball BSW:[id number] DOB: [birth year] DOA: 04/20/2015 PCP: [name], NP  Brief Summary  The patient is a 59 y.o. 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 In the ER, she was hypoxic to the mid-80s with trying to get to the edge of the stretcher to start ambulating, tachycardic to 110s. CXR demonstrated stable spiculated right upper lobe mass. CT angio chest was negative for PE and pneumonia and showed only stable RUL soft tissue density with trace right pleural effusion. She was given solumedrol, duoneb, levofloxacin. ECG demonstrated stable sinus tachycardic without ischemic changes and troponin was negative.   Assessment/Plan  Acute hypoxic respiratory failure likely secondary to acute COPD exacerbation. Still SOB at rest - Continue Solumedrol  - Continue symbicort - Duoneb q4h with albuterol q2h prn - Continue Levofloxacin  - Wean oxygen as tolerated - IS/flutter valve  Chest pain: Differential includes ACS, GERD, MSK pain. History, ECG, Age (0-45, 34-64, >=65), Risk factors (CAD, CVD, PVD, HLD, HTN, DM, tobacco, FH, obesity), Troponin. HEART Score = 2. Most likely she is having some MSK pain related to COPD exacerbation in the setting of malignancy. - Telemetry:  Sinus tachycardia, d/c telemetry - Cycle troponins and ECG:  Neg and stable - A1c pending & lipid panel with LDL 95 and HDL 32 - Continue daily aspirin - Continue beta blocker and high dose statin - NTG prn chest pain - Recommended outpatient stress test  SVC syndrome - Continue lovenox  Diabetes mellitus type 2 with hyperglycemia - A1c pending - Hold oral medications - increase to mod dose SSI and HS insulin -  Add levemir 10 units  QHS  Normocytic anemia due to malignancy and chemotherapy, followed by Hematology - Trend hgb - Transfuse for hgb < 7 or symptomatic anemia  Recurrent non-small cell lung CA, squamous cell, currently on gemcitabine - Dr. [name] added to rounding list  Diet: diabetic Access: PIV IVF: off Proph: lovenox (therapeutic)  Code Status: full Family Communication: patient alone Disposition Plan:  Goals for discharge:  Improvement in dyspnea, transition to oral prednisone, possibly off oxygen, although if clinically improved and still with O2 requirement, home with home O2   Consultants:  none  Procedures:  CXR 7/3:  stable spiculated right upper lobe mass  CT angio chest 7/3:  negative for PE and pneumonia and showed only stable RUL soft tissue density with trace right pleural effusion  Antibiotics:  Levofloxacin 7/3   HPI/Subjective:  Continues to have cough with shortness of breath. She has dyspnea at rest. She denies fevers, chills. Yesterday she had nausea and multiple episodes of emesis that required several doses of antiemetics to improve. She feels a little better this morning. She was unable to eat much breakfast.    Objective: Filed Vitals:   04/20/15 1939 04/20/15 2234 04/21/15 0515 04/21/15 0757  BP:  119/74 114/54   Pulse:  112 102   Temp:  98.2 F (36.8 C) 97.7 F (36.5 C)   TempSrc:  Oral Oral   Resp:  20 18   Height:      Weight:      SpO2: 93% 93% 93% 92%    Intake/Output Summary (Last 24 hours) at 04/21/15 1121 Last data filed at 04/21/15 0700  Gross per 24 hour  Intake   1440 ml  Output      0 ml  Net   1440 ml   Filed Weights   04/20/15 1036  Weight: 78.6 kg (173 lb 4.5 oz)   Body mass index is 26.35 kg/(m^2).  Exam:   General:  Adult female, No acute distress, nasal cannula in place  HEENT:  NCAT, MMM  Cardiovascular:  RRR, nl S1, S2 no mrg, 2+ pulses, warm extremities  Respiratory:  Rales at the bilateral bases, end  expiratory wheeze, no rhonchi, no increased WOB  Abdomen:   NABS, soft, NT/ND  MSK:   Normal tone and bulk, no LEE  Neuro:  Grossly intact  Data Reviewed: Basic Metabolic Panel:  Recent Labs Lab 04/17/15 1134 04/20/15 0340 04/21/15 0414  NA 138 135 139  K 4.2 4.0 4.6  CL  --  99* 101  CO2 '25 23 27  '$ GLUCOSE 202* 170* 208*  BUN 4.8* 8 13  CREATININE 0.7 0.63 0.61  CALCIUM 8.3* 8.0* 8.4*   Liver Function Tests:  Recent Labs Lab 04/17/15 1134 04/20/15 0340  AST 21 22  ALT 15 14  ALKPHOS 74 74  BILITOT 0.39 0.6  PROT 6.3* 6.7  ALBUMIN 3.0* 3.4*   No results for input(s): LIPASE, AMYLASE in the last 168 hours. No results for input(s): AMMONIA in the last 168 hours. CBC:  Recent Labs Lab 04/17/15 1134 04/20/15 0340 04/21/15 0414  WBC 4.9 7.2 7.4  NEUTROABS 3.8  --   --   HGB 9.4* 9.6* 9.6*  HCT 29.6* 30.6* 31.0*  MCV 91.1 93.0 94.2  PLT 80* 136* 177    Recent Results (from the past 240 hour(s))  TECHNOLOGIST REVIEW     Status: None   Collection Time: 04/17/15 11:34 AM  Result Value Ref Range Status   Technologist Review Metas and Myelocytes present  Final     Studies: Dg Chest 2 View  04/20/2015   CLINICAL DATA:  Increasing dyspnea.  Known lung cancer.  EXAM: CHEST  2 VIEW  COMPARISON:  01/18/2015  FINDINGS: There is a right jugular Port-A-Cath with tip in the low SVC. There is an SVC stent. There is a spiculated mass in the medial right upper lobe, not significantly changed. No superimposed acute infiltrate or congestive failure is evident. There are no effusions.  IMPRESSION: No acute findings.  Unchanged spiculated right upper lobe mass.   Electronically Signed   By: Andreas Newport M.D.   On: 04/20/2015 03:49   Ct Angio Chest Pe W/cm &/or Wo Cm  04/20/2015   CLINICAL DATA:  Shortness of Breath, tachycardia. Lung disease. Ongoing chemotherapy.  EXAM: CT ANGIOGRAPHY CHEST WITH CONTRAST  TECHNIQUE: Multidetector CT imaging of the chest was performed using  the standard protocol during bolus administration of intravenous contrast. Multiplanar CT image reconstructions and MIPs were obtained to evaluate the vascular anatomy.  CONTRAST:  113m OMNIPAQUE IOHEXOL 350 MG/ML SOLN  COMPARISON:  02/17/2015  FINDINGS: No filling defects in the pulmonary arteries to suggest pulmonary emboli. Heart is normal size. Aortic atherosclerosis without aneurysm or dissection. SVC remains patent.  Abnormal soft tissue in the mediastinum and right paratracheal region, surrounding the trachea and right mainstem bronchus extending into the right hilum again noted and stable. Airspace opacity in the right upper lobe adjacent to the right hilum is again noted and is similar to prior study, likely radiation pneumonitis or fibrosis. Trace right pleural effusion. Stable small nodules in the left upper lobe. Otherwise, Left lung is clear.  Right chest wall  Port-A-Cath remains in place, unchanged. Imaging into the upper abdomen shows no acute findings.  No acute bony abnormality or focal bone lesion. Collateral venous channels are noted throughout the chest wall and mediastinum likely related to prior SVC occlusion. Again, SVC appears patent currently.  Review of the MIP images confirms the above findings.  IMPRESSION: No evidence of pulmonary embolus.  Stable appearance of the confluent mediastinal and right perihilar/suprahilar soft tissue density.  Trace right effusion.   Electronically Signed   By: Rolm Baptise M.D.   On: 04/20/2015 07:30    Scheduled Meds: . antiseptic oral rinse  7 mL Mouth Rinse BID  . aspirin  81 mg Oral Daily  . atorvastatin  80 mg Oral q1800  . benzonatate  100 mg Oral TID  . budesonide-formoterol  2 puff Inhalation BID  . cholecalciferol  1,000 Units Oral q morning - 10a  . enoxaparin  120 mg Subcutaneous QHS  . feeding supplement (ENSURE ENLIVE)  237 mL Oral BID BM  . insulin aspart  0-5 Units Subcutaneous QHS  . insulin aspart  0-9 Units Subcutaneous TID WC   . ipratropium-albuterol  3 mL Nebulization QID  . levofloxacin (LEVAQUIN) IV  500 mg Intravenous Q24H  . levothyroxine  137 mcg Oral QAC breakfast  . magnesium gluconate  500 mg Oral q morning - 10a  . methylPREDNISolone (SOLU-MEDROL) injection  60 mg Intravenous BID  . multivitamin with minerals  1 tablet Oral q morning - 10a  . sertraline  150 mg Oral QHS  . sodium chloride  3 mL Intravenous Q12H  . spironolactone  25 mg Oral q morning - 10a  . cyanocobalamin  1,000 mcg Oral q morning - 10a   Continuous Infusions:   Active Problems:   Primary cancer of right upper lobe of lung   DM type 2 (diabetes mellitus, type 2)   COPD with acute exacerbation   Chest pain   SVC syndrome   Acute exacerbation of chronic obstructive pulmonary disease (COPD)    Time spent: 30 min    Reace Breshears, [place] [facility] Pager [phone]. If 7PM-7AM, please contact night-coverage at [email or web address], password [facility] 04/21/2015, 11:21 AM  LOS: 1 day

## 2015-04-21 NOTE — Progress Notes (Signed)
Initial Nutrition Assessment  DOCUMENTATION CODES:  Not applicable  INTERVENTION: - Continue Ensure Enlive BID, each supplement provides 350 kcal and 20 grams of protein - RD will continue to monitor for needs  NUTRITION DIAGNOSIS:  Increased nutrient needs related to cancer and cancer related treatments as evidenced by estimated needs.  GOAL:  Patient will meet greater than or equal to 90% of their needs  MONITOR:  PO intake, Weight trends, Labs, I & O's  REASON FOR ASSESSMENT:  Malnutrition Screening Tool  ASSESSMENT: 59 y.o. 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 She states that she developed an increased cough, nonproductive over the last 3 days. She had worsening dyspnea on exertion and finally at rest which prompted her to come to the emergency department. She has felt tight in her chest and wheezy.   Pt seen for MST. BMI indicates overweight status. Pt consumed grits, BLT, and yogurt for breakfast this AM without issue. She states PTA she had no abdominal pain but did have severe nausea with no vomiting that was ongoing for several days. During this time she was still able to consume solid, bland foods but amount she consumed decreased. She reports that during chemo she did not experience taste alteration although every so often foods would simply taste "off" but she is unable to describe this further.  Pt was not drinking nutrition supplements PTA and she states she lost ~8 lbs over the past few months. Per weight hx review, weight has been stable from 168-173 lbs x5 months.  Pt reports resolution of nausea this AM. Likely to meet needs at this time. Medications reviewed. Labs reviewed; CBGs: 198-278 mg/dL, Ca: 8.4 mg/dL.  Height:  Ht Readings from Last 1 Encounters:  04/20/15 '5\' 8"'$   (1.727 m)    Weight:  Wt Readings from Last 1 Encounters:  04/20/15 173 lb 4.5 oz (78.6 kg)    Ideal Body Weight:  63.64 kg (kg)  Wt Readings from Last 10 Encounters:  04/20/15 173 lb 4.5 oz (78.6 kg)  04/03/15 168 lb 8 oz (76.431 kg)  03/13/15 174 lb 6.4 oz (79.107 kg)  02/20/15 170 lb 3.2 oz (77.202 kg)  01/30/15 168 lb 1.6 oz (76.25 kg)  01/19/15 179 lb 0.2 oz (81.2 kg)  01/09/15 171 lb 1.6 oz (77.61 kg)  12/19/14 171 lb 11.2 oz (77.883 kg)  12/11/14 171 lb 6.4 oz (77.747 kg)  12/06/14 173 lb 1.6 oz (78.518 kg)    BMI:  Body mass index is 26.35 kg/(m^2).  Estimated Nutritional Needs:  Kcal:  2100-2300  Protein:  80-90 grams  Fluid:  2.2 L/day  Skin:  Reviewed, no issues  Diet Order:  Diet Carb Modified Fluid consistency:: Thin; Room service appropriate?: Yes  EDUCATION NEEDS:  No education needs identified at this time   Intake/Output Summary (Last 24 hours) at 04/21/15 0958 Last data filed at 04/21/15 0700  Gross per 24 hour  Intake   1440 ml  Output      0 ml  Net   1440 ml    Last BM:  PTA    Jarome Matin, RD, LDN Inpatient Clinical Dietitian Pager # [phone] After hours/weekend pager # [phone]

## 2015-04-22 LAB — GLUCOSE, CAPILLARY
GLUCOSE-CAPILLARY: 221 mg/dL — AB (ref 65–99)
GLUCOSE-CAPILLARY: 281 mg/dL — AB (ref 65–99)
GLUCOSE-CAPILLARY: 281 mg/dL — AB (ref 65–99)
Glucose-Capillary: 139 mg/dL — ABNORMAL HIGH (ref 65–99)

## 2015-04-22 LAB — BASIC METABOLIC PANEL
ANION GAP: 9 (ref 5–15)
BUN: 20 mg/dL (ref 6–20)
CHLORIDE: 100 mmol/L — AB (ref 101–111)
CO2: 29 mmol/L (ref 22–32)
Calcium: 7.8 mg/dL — ABNORMAL LOW (ref 8.9–10.3)
Creatinine, Ser: 0.66 mg/dL (ref 0.44–1.00)
GFR calc Af Amer: >60 mL/min (ref >60)
GFR calc non Af Amer: >60 mL/min (ref >60)
GLUCOSE: 271 mg/dL — AB (ref 65–99)
POTASSIUM: 5.3 mmol/L — AB (ref 3.5–5.1)
SODIUM: 138 mmol/L (ref 135–145)

## 2015-04-22 LAB — CBC
HCT: 28.7 % — ABNORMAL LOW (ref 36.0–46.0)
Hemoglobin: 8.7 g/dL — ABNORMAL LOW (ref 12.0–15.0)
MCH: 28.9 pg (ref 26.0–34.0)
MCHC: 30.3 g/dL (ref 30.0–36.0)
MCV: 95.3 fL (ref 78.0–100.0)
PLATELETS: 233 10*3/uL (ref 150–400)
RBC: 3.01 MIL/uL — ABNORMAL LOW (ref 3.87–5.11)
RDW: 19.9 % — ABNORMAL HIGH (ref 11.5–15.5)
WBC: 9 10*3/uL (ref 4.0–10.5)

## 2015-04-22 LAB — HEMOGLOBIN A1C
HEMOGLOBIN A1C: 6.7 % — AB (ref 4.8–5.6)
MEAN PLASMA GLUCOSE: 146 mg/dL

## 2015-04-22 MED ORDER — INSULIN DETEMIR 100 UNIT/ML ~~LOC~~ SOLN
15.0000 [IU] | Freq: Every day | SUBCUTANEOUS | Status: DC
  Administered 2015-04-22 – 2015-04-25 (×4): 15 [IU] via SUBCUTANEOUS
  Filled 2015-04-22 (×7): qty 0.15

## 2015-04-22 MED ORDER — DOXYCYCLINE HYCLATE 100 MG PO TABS
100.0000 mg | ORAL_TABLET | Freq: Two times a day (BID) | ORAL | Status: DC
  Administered 2015-04-22 – 2015-04-26 (×9): 100 mg via ORAL
  Filled 2015-04-22 (×10): qty 1

## 2015-04-22 MED ORDER — INSULIN ASPART 100 UNIT/ML ~~LOC~~ SOLN
5.0000 [IU] | Freq: Three times a day (TID) | SUBCUTANEOUS | Status: DC
  Administered 2015-04-22 – 2015-04-26 (×13): 5 [IU] via SUBCUTANEOUS

## 2015-04-22 MED ORDER — SODIUM CHLORIDE 0.9 % IJ SOLN
10.0000 mL | INTRAMUSCULAR | Status: DC | PRN
  Administered 2015-04-22: 10 mL
  Administered 2015-04-23: 30 mL
  Administered 2015-04-23 – 2015-04-26 (×5): 10 mL
  Filled 2015-04-22 (×6): qty 40

## 2015-04-22 NOTE — Progress Notes (Signed)
[facility] PROGRESS NOTE  Janice Ball YPP:[id number] DOB: [birth year] DOA: 04/20/2015 PCP: [name], NP  Brief Summary  The patient is a 59 y.o. 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 In the ER, she was hypoxic to the mid-80s with trying to get to the edge of the stretcher to start ambulating, tachycardic to 110s. CXR demonstrated stable spiculated right upper lobe mass. CT angio chest was negative for PE and pneumonia and showed only stable RUL soft tissue density with trace right pleural effusion. She was given solumedrol, duoneb, levofloxacin. ECG demonstrated stable sinus tachycardic without ischemic changes and troponin was negative.   Assessment/Plan  Acute hypoxic respiratory failure likely secondary to acute COPD exacerbation. Still SOB at rest and winded while talking - Continue Solumedrol at current dose - Continue symbicort - Duoneb q4h with albuterol q2h prn - d/c Levofloxacin (borderline prolonged QTc) and start doxy - Wean oxygen as tolerated - IS/flutter valve  Chest pain: Differential includes ACS, GERD, MSK pain. History, ECG, Age (0-45, 45-64, >=65), Risk factors (CAD, CVD, PVD, HLD, HTN, DM, tobacco, FH, obesity), Troponin. HEART Score = 2. Most likely she is having some MSK pain related to COPD exacerbation in the setting of malignancy. - Telemetry:  Sinus tachycardia, d/c telemetry - Cycle troponins and ECG:  Neg and stable - A1c 6.7 & lipid panel with LDL 95 and HDL 32 - Continue daily aspirin, beta blocker and high dose statin - NTG prn chest pain - Recommended outpatient stress test  SVC syndrome - Continue lovenox  Diabetes mellitus type 2 with hyperglycemia - A1c 6.7 - Hold oral medications - Increase to  mod dose SSI and HS insulin -  Add mealtime aspart -  Increase to levemir 15 units QHS  Normocytic anemia due to malignancy and chemotherapy, followed by Hematology - Trend hgb - Transfuse for hgb < 7 or symptomatic anemia  Recurrent non-small cell lung CA, squamous cell, currently on gemcitabine - Dr. [name] added to rounding list  Hyperkalemia, d/c spironolactone and repeat in AM  Diet: diabetic Access: PIV IVF: off Proph: lovenox (therapeutic)  Code Status: full Family Communication: patient alone Disposition Plan:  Goals for discharge:  Improvement in dyspnea, transition to oral prednisone, possibly off oxygen, although if clinically improved and still with O2 requirement, home with home O2   Consultants:  none  Procedures:  CXR 7/3:  stable spiculated right upper lobe mass  CT angio chest 7/3:  negative for PE and pneumonia and showed only stable RUL soft tissue density with trace right pleural effusion  Antibiotics:  Levofloxacin 7/3   HPI/Subjective:  Feeling a little better but still SOB with exertion and requiring 1L [place] to maintain oxygen saturations.    ]Objective: Filed Vitals:   04/21/15 1949 04/21/15 2121 04/22/15 0430 04/22/15 0745  BP:  133/63 138/84   Pulse:  126 101   Temp:  98.4 F (36.9 C) 98.2 F (36.8 C)   TempSrc:  Oral Oral   Resp:  20 18   Height:      Weight:      SpO2: 93% 94% 96% 94%    Intake/Output Summary (Last 24 hours) at 04/22/15 1124 Last data filed at 04/22/15 0700  Gross per 24 hour  Intake    850 ml  Output  0 ml  Net    850 ml   Filed Weights   04/20/15 1036  Weight: 78.6 kg (173 lb 4.5 oz)   Body mass index is 26.35 kg/(m^2).  Exam:   General:  Adult female, No acute distress, nasal cannula in place  HEENT:  NCAT, MMM  Cardiovascular:  RRR, nl S1, S2 no mrg, 2+ pulses, warm extremities  Respiratory:  Rales at the bilateral bases, no wheeze, no rhonchi, no increased WOB  Abdomen:   NABS,  soft, NT/ND  MSK:   Normal tone and bulk, no LEE  Neuro:  Grossly intact  Data Reviewed: Basic Metabolic Panel:  Recent Labs Lab 04/17/15 1134 04/20/15 0340 04/21/15 0414 04/22/15 0505  NA 138 135 139 138  K 4.2 4.0 4.6 5.3*  CL  --  99* 101 100*  CO2 '25 23 27 29  '$ GLUCOSE 202* 170* 208* 271*  BUN 4.8* '8 13 20  '$ CREATININE 0.7 0.63 0.61 0.66  CALCIUM 8.3* 8.0* 8.4* 7.8*   Liver Function Tests:  Recent Labs Lab 04/17/15 1134 04/20/15 0340  AST 21 22  ALT 15 14  ALKPHOS 74 74  BILITOT 0.39 0.6  PROT 6.3* 6.7  ALBUMIN 3.0* 3.4*   No results for input(s): LIPASE, AMYLASE in the last 168 hours. No results for input(s): AMMONIA in the last 168 hours. CBC:  Recent Labs Lab 04/17/15 1134 04/20/15 0340 04/21/15 0414 04/22/15 0505  WBC 4.9 7.2 7.4 9.0  NEUTROABS 3.8  --   --   --   HGB 9.4* 9.6* 9.6* 8.7*  HCT 29.6* 30.6* 31.0* 28.7*  MCV 91.1 93.0 94.2 95.3  PLT 80* 136* 177 233    Recent Results (from the past 240 hour(s))  TECHNOLOGIST REVIEW     Status: None   Collection Time: 04/17/15 11:34 AM  Result Value Ref Range Status   Technologist Review Metas and Myelocytes present  Final     Studies: No results found.  Scheduled Meds: . antiseptic oral rinse  7 mL Mouth Rinse BID  . aspirin  81 mg Oral Daily  . atorvastatin  80 mg Oral q1800  . benzonatate  100 mg Oral TID  . budesonide-formoterol  2 puff Inhalation BID  . cholecalciferol  1,000 Units Oral q morning - 10a  . doxycycline  100 mg Oral Q12H  . enoxaparin  120 mg Subcutaneous QHS  . feeding supplement (ENSURE ENLIVE)  237 mL Oral BID BM  . insulin aspart  0-15 Units Subcutaneous TID WC  . insulin aspart  0-5 Units Subcutaneous QHS  . insulin aspart  5 Units Subcutaneous TID WC  . insulin detemir  15 Units Subcutaneous QHS  . ipratropium-albuterol  3 mL Nebulization QID  . levothyroxine  137 mcg Oral QAC breakfast  . magnesium gluconate  500 mg Oral q morning - 10a  . methylPREDNISolone  (SOLU-MEDROL) injection  60 mg Intravenous BID  . multivitamin with minerals  1 tablet Oral q morning - 10a  . sertraline  150 mg Oral QHS  . sodium chloride  3 mL Intravenous Q12H  . cyanocobalamin  1,000 mcg Oral q morning - 10a   Continuous Infusions:   Active Problems:   Primary cancer of right upper lobe of lung   DM type 2 (diabetes mellitus, type 2)   COPD with acute exacerbation   Chest pain   SVC syndrome   Acute exacerbation of chronic obstructive pulmonary disease (COPD)    Time spent: 30 min  Janece Canterbury  [facility] Pager [phone]. If 7PM-7AM, please contact night-coverage at [email or web address], password [facility] 04/22/2015, 11:24 AM  LOS: 2 days

## 2015-04-22 NOTE — Progress Notes (Signed)
Pt ambulated short distance in hallway with one person assist. On Room Air O2 Sat 87-88%. Pt tolerated activity fair tiring quickly and moderate shortness of breath. Returned back to room and O2 placed back on

## 2015-04-22 NOTE — Care Management (Signed)
Important Message  Patient Details  Name: Janice Ball MRN: [id number] Date of Birth: 08/13/1956   Medicare Important Message Given:  Yes-second notification given    Camillo Flaming, LCSW 04/22/2015, 1:34 PM

## 2015-04-23 ENCOUNTER — Telehealth: Payer: Self-pay | Admitting: Internal Medicine

## 2015-04-23 LAB — CBC
HCT: 29.2 % — ABNORMAL LOW (ref 36.0–46.0)
Hemoglobin: 9.1 g/dL — ABNORMAL LOW (ref 12.0–15.0)
MCH: 29.3 pg (ref 26.0–34.0)
MCHC: 31.2 g/dL (ref 30.0–36.0)
MCV: 93.9 fL (ref 78.0–100.0)
Platelets: 229 10*3/uL (ref 150–400)
RBC: 3.11 MIL/uL — AB (ref 3.87–5.11)
RDW: 19.6 % — ABNORMAL HIGH (ref 11.5–15.5)
WBC: 7.2 10*3/uL (ref 4.0–10.5)

## 2015-04-23 LAB — BASIC METABOLIC PANEL
Anion gap: 9 (ref 5–15)
BUN: 18 mg/dL (ref 6–20)
CALCIUM: 7.9 mg/dL — AB (ref 8.9–10.3)
CO2: 28 mmol/L (ref 22–32)
Chloride: 100 mmol/L — ABNORMAL LOW (ref 101–111)
Creatinine, Ser: 0.65 mg/dL (ref 0.44–1.00)
GFR calc Af Amer: >60 mL/min (ref >60)
GFR calc non Af Amer: >60 mL/min (ref >60)
GLUCOSE: 255 mg/dL — AB (ref 65–99)
Potassium: 4.4 mmol/L (ref 3.5–5.1)
Sodium: 137 mmol/L (ref 135–145)

## 2015-04-23 LAB — GLUCOSE, CAPILLARY
GLUCOSE-CAPILLARY: 236 mg/dL — AB (ref 65–99)
GLUCOSE-CAPILLARY: 152 mg/dL — AB (ref 65–99)
Glucose-Capillary: 224 mg/dL — ABNORMAL HIGH (ref 65–99)
Glucose-Capillary: 453 mg/dL — ABNORMAL HIGH (ref 65–99)
Glucose-Capillary: 401 mg/dL — ABNORMAL HIGH (ref 65–99)

## 2015-04-23 NOTE — Progress Notes (Signed)
CRITICAL VALUE ALERT  Critical value received: CBG 423  Date of notification:04/23/2015 Time of notification:  1700   Critical value read back:No.  Nurse who received alert:  Arbie Cookey  MD notified (1st page):  Dr. Wendee Beavers   Time of first page:  1702  MD notified (2nd page):  Time of second page:  Responding MD: Dr. Wendee Beavers Time MD responded:  [id number]

## 2015-04-23 NOTE — Progress Notes (Signed)
[facility] PROGRESS NOTE  Janice Ball ZOX:[id number] DOB: 08-15-1956 DOA: 04/20/2015 PCP: [name], NP  Brief Summary  The patient is a 59 y.o. 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 In the ER, she was hypoxic to the mid-80s with trying to get to the edge of the stretcher to start ambulating, tachycardic to 110s. CXR demonstrated stable spiculated right upper lobe mass. CT angio chest was negative for PE and pneumonia and showed only stable RUL soft tissue density with trace right pleural effusion. She was given solumedrol, duoneb, levofloxacin. ECG demonstrated stable sinus tachycardic without ischemic changes and troponin was negative.   Assessment/Plan  Acute hypoxic respiratory failure likely secondary to acute COPD exacerbation.  - continue Solumedrol  - symbicort - Duoneb q4h with albuterol q2h prn - On doxycycline currently - Wean oxygen as tolerated - IS/flutter valve  Chest pain: Differential includes ACS, GERD, MSK pain. History, ECG, Age (0-45, 24-64, >=65), Risk factors (CAD, CVD, PVD, HLD, HTN, DM, tobacco, FH, obesity), Troponin. HEART Score = 2. Most likely she is having some MSK pain related to COPD exacerbation in the setting of malignancy. - Telemetry:  Sinus tachycardia, d/c telemetry - Cycle troponins and ECG:  Neg and stable - A1c 6.7 & lipid panel with LDL 95 and HDL 32 - Continue daily aspirin, beta blocker and high dose statin - NTG prn chest pain  SVC syndrome - Continue lovenox  Diabetes mellitus type 2 with hyperglycemia - A1c 6.7 - Hold oral medications - Increase to mod dose SSI and HS insulin -  Add mealtime aspart -  Increase to levemir 15 units QHS  Normocytic anemia due to malignancy and  chemotherapy, followed by Hematology - Trend hgb - Transfuse for hgb < 7 or symptomatic anemia  Recurrent non-small cell lung CA, squamous cell, currently on gemcitabine - Dr. [name] added to rounding list  Hyperkalemia, d/c spironolactone and repeat in AM  Diet: diabetic Access: PIV IVF: off Proph: lovenox (therapeutic)  Code Status: full Family Communication: patient alone Disposition Plan:  Goals for discharge:  Improvement in dyspnea, transition to oral prednisone, possibly off oxygen, although if clinically improved and still with O2 requirement, home with home O2 . Not at baseline currently.  Consultants:  none  Procedures:  CXR 7/3:  stable spiculated right upper lobe mass  CT angio chest 7/3:  negative for PE and pneumonia and showed only stable RUL soft tissue density with trace right pleural effusion  Antibiotics:  Levofloxacin 7/3   HPI/Subjective:  Still feeling sob but reports mild improvement.   ]Objective: Filed Vitals:   04/23/15 0505 04/23/15 0753 04/23/15 1224 04/23/15 1405  BP: 131/63   136/67  Pulse: 99   113  Temp: 97.9 F (36.6 C)   98.2 F (36.8 C)  TempSrc: Oral   Oral  Resp: 20   18  Height:      Weight:      SpO2: 96% 97% 93% 96%    Intake/Output Summary (Last 24 hours) at 04/23/15 1451 Last data filed at 04/23/15 1406  Gross per 24 hour  Intake   1440 ml  Output      0 ml  Net   1440 ml   Filed Weights   04/20/15 1036  Weight: 78.6 kg (173 lb 4.5 oz)   Body mass  index is 26.35 kg/(m^2).  Exam:   General:  Pt in nad,alert and awake  HEENT:  NCAT, MMM  Cardiovascular:  RRR, nl S1, S2 no mrg, 2+ pulses, warm extremities  Respiratory:  Rales at the bilateral bases, no wheeze, no rhonchi, no increased WOB  Abdomen:   NABS, soft, NT/ND  MSK:   Normal tone and bulk, no LEE  Neuro:  Answers questions appropriately moves extremities equally  Data Reviewed: Basic Metabolic Panel:  Recent Labs Lab  04/17/15 1134 04/20/15 0340 04/21/15 0414 04/22/15 0505 04/23/15 0530  NA 138 135 139 138 137  K 4.2 4.0 4.6 5.3* 4.4  CL  --  99* 101 100* 100*  CO2 '25 23 27 29 28  '$ GLUCOSE 202* 170* 208* 271* 255*  BUN 4.8* '8 13 20 18  '$ CREATININE 0.7 0.63 0.61 0.66 0.65  CALCIUM 8.3* 8.0* 8.4* 7.8* 7.9*   Liver Function Tests:  Recent Labs Lab 04/17/15 1134 04/20/15 0340  AST 21 22  ALT 15 14  ALKPHOS 74 74  BILITOT 0.39 0.6  PROT 6.3* 6.7  ALBUMIN 3.0* 3.4*   No results for input(s): LIPASE, AMYLASE in the last 168 hours. No results for input(s): AMMONIA in the last 168 hours. CBC:  Recent Labs Lab 04/17/15 1134 04/20/15 0340 04/21/15 0414 04/22/15 0505 04/23/15 0530  WBC 4.9 7.2 7.4 9.0 7.2  NEUTROABS 3.8  --   --   --   --   HGB 9.4* 9.6* 9.6* 8.7* 9.1*  HCT 29.6* 30.6* 31.0* 28.7* 29.2*  MCV 91.1 93.0 94.2 95.3 93.9  PLT 80* 136* 177 233 229    Recent Results (from the past 240 hour(s))  TECHNOLOGIST REVIEW     Status: None   Collection Time: 04/17/15 11:34 AM  Result Value Ref Range Status   Technologist Review Metas and Myelocytes present  Final     Studies: No results found.  Scheduled Meds: . antiseptic oral rinse  7 mL Mouth Rinse BID  . aspirin  81 mg Oral Daily  . atorvastatin  80 mg Oral q1800  . benzonatate  100 mg Oral TID  . budesonide-formoterol  2 puff Inhalation BID  . cholecalciferol  1,000 Units Oral q morning - 10a  . doxycycline  100 mg Oral Q12H  . enoxaparin  120 mg Subcutaneous QHS  . feeding supplement (ENSURE ENLIVE)  237 mL Oral BID BM  . insulin aspart  0-15 Units Subcutaneous TID WC  . insulin aspart  0-5 Units Subcutaneous QHS  . insulin aspart  5 Units Subcutaneous TID WC  . insulin detemir  15 Units Subcutaneous QHS  . ipratropium-albuterol  3 mL Nebulization QID  . levothyroxine  137 mcg Oral QAC breakfast  . magnesium gluconate  500 mg Oral q morning - 10a  . methylPREDNISolone (SOLU-MEDROL) injection  60 mg Intravenous BID   . multivitamin with minerals  1 tablet Oral q morning - 10a  . sertraline  150 mg Oral QHS  . sodium chloride  3 mL Intravenous Q12H  . cyanocobalamin  1,000 mcg Oral q morning - 10a   Continuous Infusions:   Active Problems:   Primary cancer of right upper lobe of lung   DM type 2 (diabetes mellitus, type 2)   COPD with acute exacerbation   Chest pain   SVC syndrome   Acute exacerbation of chronic obstructive pulmonary disease (COPD)    Time spent: 30 min    Velvet Bathe  [facility] Pager [phone] If  7PM-7AM, please contact night-coverage at [email or web address], password [facility] 04/23/2015, 2:51 PM  LOS: 3 days

## 2015-04-23 NOTE — Telephone Encounter (Signed)
returned call and pt could not decide if she still need to keep appt tomorrow...i transferred her to desk nurse to determine if she still need appt

## 2015-04-23 NOTE — Care Management Note (Signed)
Case Management Note  Patient Details  Name: CHABELY NORBY MRN: [id number] Date of Birth: [birth year]  Subjective/Objective:    Pt admitted with SOB, COPD                Action/Plan: from home  Expected Discharge Date:                  Expected Discharge Plan:  Home/Self Care  In-House Referral:     Discharge planning Services  CM Consult  Post Acute Care Choice:    Choice offered to:     DME Arranged:    DME Agency:     HH Arranged:    [place] Agency:     Status of Service:  In process, will continue to follow  Medicare Important Message Given:  Yes-second notification given Date Medicare IM Given:    Medicare IM give by:    Date Additional Medicare IM Given:    Additional Medicare Important Message give by:     If discussed at [place] of Stay Meetings, dates discussed:    Additional CommentsPurcell Mouton, RN 04/23/2015, 1:08 PM

## 2015-04-24 ENCOUNTER — Other Ambulatory Visit: Payer: Medicare Other

## 2015-04-24 ENCOUNTER — Ambulatory Visit: Payer: Medicare Other

## 2015-04-24 ENCOUNTER — Ambulatory Visit: Payer: Medicare Other | Admitting: Internal Medicine

## 2015-04-24 LAB — CBC
HCT: 29.3 % — ABNORMAL LOW (ref 36.0–46.0)
Hemoglobin: 8.9 g/dL — ABNORMAL LOW (ref 12.0–15.0)
MCH: 27.9 pg (ref 26.0–34.0)
MCHC: 30.4 g/dL (ref 30.0–36.0)
MCV: 91.8 fL (ref 78.0–100.0)
Platelets: 255 10*3/uL (ref 150–400)
RBC: 3.19 MIL/uL — ABNORMAL LOW (ref 3.87–5.11)
RDW: 19.1 % — AB (ref 11.5–15.5)
WBC: 8.4 10*3/uL (ref 4.0–10.5)

## 2015-04-24 LAB — BASIC METABOLIC PANEL
Anion gap: 9 (ref 5–15)
BUN: 23 mg/dL — ABNORMAL HIGH (ref 6–20)
CALCIUM: 7.9 mg/dL — AB (ref 8.9–10.3)
CO2: 30 mmol/L (ref 22–32)
CREATININE: 0.59 mg/dL (ref 0.44–1.00)
Chloride: 96 mmol/L — ABNORMAL LOW (ref 101–111)
GFR calc Af Amer: >60 mL/min (ref >60)
GFR calc non Af Amer: >60 mL/min (ref >60)
GLUCOSE: 308 mg/dL — AB (ref 65–99)
Potassium: 4.5 mmol/L (ref 3.5–5.1)
Sodium: 135 mmol/L (ref 135–145)

## 2015-04-24 LAB — GLUCOSE, CAPILLARY
GLUCOSE-CAPILLARY: 378 mg/dL — AB (ref 65–99)
Glucose-Capillary: 242 mg/dL — ABNORMAL HIGH (ref 65–99)
Glucose-Capillary: 357 mg/dL — ABNORMAL HIGH (ref 65–99)
Glucose-Capillary: 284 mg/dL — ABNORMAL HIGH (ref 65–99)

## 2015-04-24 MED ORDER — TIOTROPIUM BROMIDE MONOHYDRATE 18 MCG IN CAPS
18.0000 ug | ORAL_CAPSULE | Freq: Every day | RESPIRATORY_TRACT | Status: DC
  Administered 2015-04-25 – 2015-04-26 (×2): 18 ug via RESPIRATORY_TRACT
  Filled 2015-04-24: qty 5

## 2015-04-24 NOTE — Progress Notes (Signed)
[facility] PROGRESS NOTE  Janice Ball IRJ:[id number] DOB: Jan 17, 1956 DOA: 04/20/2015 PCP: [name], NP  Brief Summary  The patient is a 59 y.o. 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 In the ER, she was hypoxic to the mid-80s with trying to get to the edge of the stretcher to start ambulating, tachycardic to 110s. CXR demonstrated stable spiculated right upper lobe mass. CT angio chest was negative for PE and pneumonia and showed only stable RUL soft tissue density with trace right pleural effusion. She was given solumedrol, duoneb, levofloxacin. ECG demonstrated stable sinus tachycardic without ischemic changes and troponin was negative.   Assessment/Plan  Acute hypoxic respiratory failure likely secondary to acute COPD exacerbation.  - continue Solumedrol  - continue symbicort - d/c duoneb and place on spiriva - On doxycycline currently - Wean oxygen as tolerated - IS/flutter valve  Chest pain: Differential includes ACS, GERD, MSK pain. History, ECG, Age (0-45, 78-64, >=65), Risk factors (CAD, CVD, PVD, HLD, HTN, DM, tobacco, FH, obesity), Troponin. HEART Score = 2. Most likely she is having some MSK pain related to COPD exacerbation in the setting of malignancy. - Telemetry:  Sinus tachycardia, d/c telemetry - Cycle troponins and ECG:  Neg and stable - A1c 6.7 & lipid panel with LDL 95 and HDL 32 - Continue daily aspirin, beta blocker and high dose statin - NTG prn chest pain  SVC syndrome - Continue lovenox  Diabetes mellitus type 2 with hyperglycemia - A1c 6.7 - Hold oral medications - Increase to mod dose SSI and HS insulin -  Add mealtime aspart -  Increase to levemir 15 units QHS  Normocytic anemia due to malignancy and  chemotherapy, followed by Hematology - Trend hgb - Transfuse for hgb < 7 or symptomatic anemia  Recurrent non-small cell lung CA, squamous cell, currently on gemcitabine - Dr. [name] added to rounding list  Hyperkalemia, d/c spironolactone and repeat in AM  Diet: diabetic Access: PIV IVF: off Proph: lovenox (therapeutic)  Code Status: full Family Communication: patient alone Disposition Plan:  Goals for discharge:  Wean to room air.  Consultants:  none  Procedures:  CXR 7/3:  stable spiculated right upper lobe mass  CT angio chest 7/3:  negative for PE and pneumonia and showed only stable RUL soft tissue density with trace right pleural effusion  Antibiotics:  Levofloxacin 7/3   HPI/Subjective:  Feels only mildly improved.   ]Objective: Filed Vitals:   04/24/15 0525 04/24/15 1200 04/24/15 1349 04/24/15 1521  BP: 113/66  145/62   Pulse: 97  115   Temp: 97.8 F (36.6 C)  98 F (36.7 C)   TempSrc: Oral  Oral   Resp: 17  16   Height:      Weight:      SpO2: 95% 95% 94% 99%    Intake/Output Summary (Last 24 hours) at 04/24/15 1628 Last data filed at 04/23/15 2128  Gross per 24 hour  Intake    714 ml  Output      0 ml  Net    714 ml   Filed Weights   04/20/15 1036  Weight: 78.6 kg (173 lb 4.5 oz)   Body mass index is 26.35 kg/(m^2).  Exam:   General:  Pt in nad,alert and awake  HEENT:  NCAT, MMM  Cardiovascular:  RRR,  nl S1, S2 no mrg, 2+ pulses, warm extremities  Respiratory:  Rales at the bilateral bases, no wheeze, no rhonchi, no increased WOB  Abdomen:   NABS, soft, NT/ND  MSK:   Normal tone and bulk, no LEE  Neuro:  Answers questions appropriately moves extremities equally  Data Reviewed: Basic Metabolic Panel:  Recent Labs Lab 04/20/15 0340 04/21/15 0414 04/22/15 0505 04/23/15 0530 04/24/15 0548  NA 135 139 138 137 135  K 4.0 4.6 5.3* 4.4 4.5  CL 99* 101 100* 100* 96*  CO2 '23 27 29 28 30  '$ GLUCOSE 170* 208* 271*  255* 308*  BUN '8 13 20 18 '$ 23*  CREATININE 0.63 0.61 0.66 0.65 0.59  CALCIUM 8.0* 8.4* 7.8* 7.9* 7.9*   Liver Function Tests:  Recent Labs Lab 04/20/15 0340  AST 22  ALT 14  ALKPHOS 74  BILITOT 0.6  PROT 6.7  ALBUMIN 3.4*   No results for input(s): LIPASE, AMYLASE in the last 168 hours. No results for input(s): AMMONIA in the last 168 hours. CBC:  Recent Labs Lab 04/20/15 0340 04/21/15 0414 04/22/15 0505 04/23/15 0530 04/24/15 0548  WBC 7.2 7.4 9.0 7.2 8.4  HGB 9.6* 9.6* 8.7* 9.1* 8.9*  HCT 30.6* 31.0* 28.7* 29.2* 29.3*  MCV 93.0 94.2 95.3 93.9 91.8  PLT 136* 177 233 229 255    Recent Results (from the past 240 hour(s))  TECHNOLOGIST REVIEW     Status: None   Collection Time: 04/17/15 11:34 AM  Result Value Ref Range Status   Technologist Review Metas and Myelocytes present  Final     Studies: No results found.  Scheduled Meds: . antiseptic oral rinse  7 mL Mouth Rinse BID  . aspirin  81 mg Oral Daily  . atorvastatin  80 mg Oral q1800  . benzonatate  100 mg Oral TID  . budesonide-formoterol  2 puff Inhalation BID  . cholecalciferol  1,000 Units Oral q morning - 10a  . doxycycline  100 mg Oral Q12H  . enoxaparin  120 mg Subcutaneous QHS  . feeding supplement (ENSURE ENLIVE)  237 mL Oral BID BM  . insulin aspart  0-15 Units Subcutaneous TID WC  . insulin aspart  0-5 Units Subcutaneous QHS  . insulin aspart  5 Units Subcutaneous TID WC  . insulin detemir  15 Units Subcutaneous QHS  . ipratropium-albuterol  3 mL Nebulization QID  . levothyroxine  137 mcg Oral QAC breakfast  . magnesium gluconate  500 mg Oral q morning - 10a  . methylPREDNISolone (SOLU-MEDROL) injection  60 mg Intravenous BID  . multivitamin with minerals  1 tablet Oral q morning - 10a  . sertraline  150 mg Oral QHS  . sodium chloride  3 mL Intravenous Q12H  . cyanocobalamin  1,000 mcg Oral q morning - 10a   Continuous Infusions:   Active Problems:   Primary cancer of right upper lobe  of lung   DM type 2 (diabetes mellitus, type 2)   COPD with acute exacerbation   Chest pain   SVC syndrome   Acute exacerbation of chronic obstructive pulmonary disease (COPD)    Time spent: 30 min    Velvet Bathe  [facility] Pager [phone] If 7PM-7AM, please contact night-coverage at [email or web address], password [email or web address] 04/24/2015, 4:28 PM  LOS: 4 days

## 2015-04-24 NOTE — Progress Notes (Addendum)
Inpatient Diabetes Program Recommendations  AACE/ADA: New Consensus Statement on Inpatient Glycemic Control (2013)  Target Ranges:  Prepandial:   less than 140 mg/dL      Peak postprandial:   less than 180 mg/dL (1-2 hours)      Critically ill patients:  140 - 180 mg/dL     Results for CARLEN, REBUCK (MRN [id number]) as of 04/24/2015 11:19  Ref. Range 04/23/2015 07:21 04/23/2015 11:47 04/23/2015 16:52 04/23/2015 18:32 04/23/2015 21:30  Glucose-Capillary Latest Ref Range: 65-99 mg/dL 236 (H) 224 (H) 453 (H) 401 (H) 152 (H)   Results for KARYN, BRULL (MRN [id number]) as of 04/24/2015 11:19  Ref. Range 04/21/2015 04:14  Hemoglobin A1C Latest Ref Range: 4.8-5.6 % 6.7 (H)    Current DM Orders: Levemir 15 units QHS            Novolog Moderate SSI (0-15 units) TID AC + HS            Novolog 5 units tidwc    -Patient with well controlled CBGs prior to hospitalization.  A1c 6.7%.  -CBGs elevated here in hospital likely due to IV steroids.  -Patient currently receiving IV Solumedrol 60 mg bid.    MD- Please consider the following in-hospital insulin adjustments while patient remains on IV steroids-  1. Increase Levemir to 20 units QHS  2. Increase Novolog Meal Coverage to 7 units tid with meals    Will follow Wyn Quaker RN, MSN, CDE Diabetes Coordinator Inpatient Glycemic Control Team Team Pager: [phone] (8a-5p)

## 2015-04-25 LAB — BASIC METABOLIC PANEL
Anion gap: 8 (ref 5–15)
BUN: 27 mg/dL — AB (ref 6–20)
CALCIUM: 8.2 mg/dL — AB (ref 8.9–10.3)
CO2: 30 mmol/L (ref 22–32)
Chloride: 98 mmol/L — ABNORMAL LOW (ref 101–111)
Creatinine, Ser: 0.64 mg/dL (ref 0.44–1.00)
GFR calc Af Amer: >60 mL/min (ref >60)
Glucose, Bld: 338 mg/dL — ABNORMAL HIGH (ref 65–99)
Potassium: 4.6 mmol/L (ref 3.5–5.1)
Sodium: 136 mmol/L (ref 135–145)

## 2015-04-25 LAB — CBC
HCT: 30.8 % — ABNORMAL LOW (ref 36.0–46.0)
Hemoglobin: 9.7 g/dL — ABNORMAL LOW (ref 12.0–15.0)
MCH: 28.5 pg (ref 26.0–34.0)
MCHC: 31.5 g/dL (ref 30.0–36.0)
MCV: 90.6 fL (ref 78.0–100.0)
PLATELETS: 303 10*3/uL (ref 150–400)
RBC: 3.4 MIL/uL — ABNORMAL LOW (ref 3.87–5.11)
RDW: 18.8 % — AB (ref 11.5–15.5)
WBC: 9.8 10*3/uL (ref 4.0–10.5)

## 2015-04-25 LAB — GLUCOSE, CAPILLARY
GLUCOSE-CAPILLARY: 250 mg/dL — AB (ref 65–99)
GLUCOSE-CAPILLARY: 254 mg/dL — AB (ref 65–99)
GLUCOSE-CAPILLARY: 281 mg/dL — AB (ref 65–99)
Glucose-Capillary: 363 mg/dL — ABNORMAL HIGH (ref 65–99)

## 2015-04-25 NOTE — Progress Notes (Signed)
Nutrition Follow-up  DOCUMENTATION CODES:  Not applicable  INTERVENTION: - Continue Ensure Enlive BID - RD will continue to monitor for needs  NUTRITION DIAGNOSIS:  Increased nutrient needs related to cancer and cancer related treatments as evidenced by estimated needs. -ongoing  GOAL:  Patient will meet greater than or equal to 90% of their needs -met  MONITOR:  PO intake, Weight trends, Labs, I & O's  ASSESSMENT: 59 y.o. 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 She states that she developed an increased cough, nonproductive over the last 3 days. She had worsening dyspnea on exertion and finally at rest which prompted her to come to the emergency department. She has felt tight in her chest and wheezy.   Pt continues with good appetite since last assessment and nausea is now resolved. She has been eating 100% of most meals since 7/4.  Meeting needs. Medications reviewed; noted recent change in insulin regimen. Labs reviewed; CBGs: 152-401 mg/dL, Cl: 98 mmol/L, BUN elevated, Ca: 8.2 mg/dL.  Height:  Ht Readings from Last 1 Encounters:  04/20/15 _0  (1.727 m)    Weight:  Wt Readings from Last 1 Encounters:  04/20/15 173 lb 4.5 oz (78.6 kg)    Ideal Body Weight:  63.64 kg (kg)  Wt Readings from Last 10 Encounters:  04/20/15 173 lb 4.5 oz (78.6 kg)  04/03/15 168 lb 8 oz (76.431 kg)  03/13/15 174 lb 6.4 oz (79.107 kg)  02/20/15 170 lb 3.2 oz (77.202 kg)  01/30/15 168 lb 1.6 oz (76.25 kg)  01/19/15 179 lb 0.2 oz (81.2 kg)  01/09/15 171 lb 1.6 oz (77.61 kg)  12/19/14 171 lb 11.2 oz (77.883 kg)  12/11/14 171 lb 6.4 oz (77.747 kg)  12/06/14 173 lb 1.6 oz (78.518 kg)    BMI:  Body mass index is 26.35 kg/(m^2).  Estimated Nutritional Needs:  Kcal:  2100-2300  Protein:  80-90  grams  Fluid:  2.2 L/day  Skin:  Reviewed, no issues  Diet Order:     EDUCATION NEEDS:  No education needs identified at this time   Intake/Output Summary (Last 24 hours) at 04/25/15 0943 Last data filed at 04/24/15 2300  Gross per 24 hour  Intake    960 ml  Output      0 ml  Net    960 ml    Last BM:  7/6     Jarome Matin, RD, LDN Inpatient Clinical Dietitian Pager # [phone] After hours/weekend pager # [phone]

## 2015-04-25 NOTE — Progress Notes (Signed)
[facility] PROGRESS NOTE  Janice Ball NTZ:[id number] DOB: 1956-06-07 DOA: 04/20/2015 PCP: [name], NP  Brief Summary  The patient is a 59 y.o. 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 In the ER, she was hypoxic to the mid-80s with trying to get to the edge of the stretcher to start ambulating, tachycardic to 110s. CXR demonstrated stable spiculated right upper lobe mass. CT angio chest was negative for PE and pneumonia and showed only stable RUL soft tissue density with trace right pleural effusion. She was given solumedrol, duoneb, levofloxacin. ECG demonstrated stable sinus tachycardic without ischemic changes and troponin was negative.   Assessment/Plan  Acute hypoxic respiratory failure likely secondary to acute COPD exacerbation.  - continue Solumedrol  - continue symbicort - d/c duoneb and placed on spiriva.  - On doxycycline currently - Continue to wean to room air as tolerated. - IS/flutter valve  Chest pain: Differential includes ACS, GERD, MSK pain. History, ECG, Age (0-45, 24-64, >=65), Risk factors (CAD, CVD, PVD, HLD, HTN, DM, tobacco, FH, obesity), Troponin. HEART Score = 2. Most likely she is having some MSK pain related to COPD exacerbation in the setting of malignancy. - Telemetry:  Sinus tachycardia, d/c telemetry - Cycle troponins and ECG:  Neg and stable - A1c 6.7 & lipid panel with LDL 95 and HDL 32 - Continue daily aspirin, beta blocker and high dose statin - NTG prn chest pain  SVC syndrome - Continue lovenox  Diabetes mellitus type 2 with hyperglycemia - A1c 6.7 - Hold oral medications - Increase to mod dose SSI and HS insulin -  Add mealtime aspart -  Increase to levemir 15 units QHS  Normocytic anemia  due to malignancy and chemotherapy, followed by Hematology - Trend hgb - Transfuse for hgb < 7 or symptomatic anemia  Recurrent non-small cell lung CA, squamous cell, currently on gemcitabine - Dr. [name] added to rounding list  Hyperkalemia, d/c spironolactone and repeat in AM  Diet: diabetic Access: PIV IVF: off Proph: lovenox (therapeutic)  Code Status: full Family Communication: patient alone Disposition Plan:  Goals for discharge:  Wean to room air. Most likely d/c next am.  Consultants:  none  Procedures:  CXR 7/3:  stable spiculated right upper lobe mass  CT angio chest 7/3:  negative for PE and pneumonia and showed only stable RUL soft tissue density with trace right pleural effusion  Antibiotics:  Levofloxacin 7/3   HPI/Subjective:  No new complaints. No acute issues overnight.   ]Objective: Filed Vitals:   04/24/15 2104 04/25/15 0517 04/25/15 0902 04/25/15 1514  BP: 144/97 129/78  140/77  Pulse: 109 96  100  Temp: 98.9 F (37.2 C) 98.1 F (36.7 C)  97.8 F (36.6 C)  TempSrc: Axillary Oral  Oral  Resp: '16 20  18  '$ Height:      Weight:      SpO2: 97% 96% 96% 97%    Intake/Output Summary (Last 24 hours) at 04/25/15 1707 Last data filed at 04/24/15 2300  Gross per 24 hour  Intake    480 ml  Output      0 ml  Net    480 ml   Filed Weights   04/20/15 1036  Weight: 78.6 kg (173 lb 4.5 oz)   Body mass index is 26.35 kg/(m^2).  Exam:  General:  Pt in nad,alert and awake  HEENT:  NCAT, MMM  Cardiovascular:  RRR, nl S1, S2 no mrg, 2+ pulses, warm extremities  Respiratory:  No rhales, no wheeze, no rhonchi, no increased WOB  Abdomen:   NABS, soft, NT/ND  MSK:   Normal tone and bulk, no LEE  Neuro:  Answers questions appropriately moves extremities equally  Data Reviewed: Basic Metabolic Panel:  Recent Labs Lab 04/21/15 0414 04/22/15 0505 04/23/15 0530 04/24/15 0548 04/25/15 0415  NA 139 138 137 135 136  K 4.6 5.3* 4.4  4.5 4.6  CL 101 100* 100* 96* 98*  CO2 '27 29 28 30 30  '$ GLUCOSE 208* 271* 255* 308* 338*  BUN '13 20 18 '$ 23* 27*  CREATININE 0.61 0.66 0.65 0.59 0.64  CALCIUM 8.4* 7.8* 7.9* 7.9* 8.2*   Liver Function Tests:  Recent Labs Lab 04/20/15 0340  AST 22  ALT 14  ALKPHOS 74  BILITOT 0.6  PROT 6.7  ALBUMIN 3.4*   No results for input(s): LIPASE, AMYLASE in the last 168 hours. No results for input(s): AMMONIA in the last 168 hours. CBC:  Recent Labs Lab 04/21/15 0414 04/22/15 0505 04/23/15 0530 04/24/15 0548 04/25/15 0415  WBC 7.4 9.0 7.2 8.4 9.8  HGB 9.6* 8.7* 9.1* 8.9* 9.7*  HCT 31.0* 28.7* 29.2* 29.3* 30.8*  MCV 94.2 95.3 93.9 91.8 90.6  PLT 177 233 229 255 303    Recent Results (from the past 240 hour(s))  TECHNOLOGIST REVIEW     Status: None   Collection Time: 04/17/15 11:34 AM  Result Value Ref Range Status   Technologist Review Metas and Myelocytes present  Final     Studies: No results found.  Scheduled Meds: . antiseptic oral rinse  7 mL Mouth Rinse BID  . aspirin  81 mg Oral Daily  . atorvastatin  80 mg Oral q1800  . benzonatate  100 mg Oral TID  . budesonide-formoterol  2 puff Inhalation BID  . cholecalciferol  1,000 Units Oral q morning - 10a  . doxycycline  100 mg Oral Q12H  . enoxaparin  120 mg Subcutaneous QHS  . feeding supplement (ENSURE ENLIVE)  237 mL Oral BID BM  . insulin aspart  0-15 Units Subcutaneous TID WC  . insulin aspart  0-5 Units Subcutaneous QHS  . insulin aspart  5 Units Subcutaneous TID WC  . insulin detemir  15 Units Subcutaneous QHS  . levothyroxine  137 mcg Oral QAC breakfast  . magnesium gluconate  500 mg Oral q morning - 10a  . methylPREDNISolone (SOLU-MEDROL) injection  60 mg Intravenous BID  . multivitamin with minerals  1 tablet Oral q morning - 10a  . sertraline  150 mg Oral QHS  . sodium chloride  3 mL Intravenous Q12H  . tiotropium  18 mcg Inhalation Daily  . cyanocobalamin  1,000 mcg Oral q morning - 10a    Continuous Infusions:   Active Problems:   Primary cancer of right upper lobe of lung   DM type 2 (diabetes mellitus, type 2)   COPD with acute exacerbation   Chest pain   SVC syndrome   Acute exacerbation of chronic obstructive pulmonary disease (COPD)    Time spent: 30 min    Velvet Bathe  [facility] Pager [phone] If 7PM-7AM, please contact night-coverage at [email or web address], password [facility] 04/25/2015, 5:07 PM  LOS: 5 days

## 2015-04-26 LAB — CBC
HCT: 32.8 % — ABNORMAL LOW (ref 36.0–46.0)
Hemoglobin: 10.5 g/dL — ABNORMAL LOW (ref 12.0–15.0)
MCH: 29.1 pg (ref 26.0–34.0)
MCHC: 32 g/dL (ref 30.0–36.0)
MCV: 90.9 fL (ref 78.0–100.0)
PLATELETS: 356 10*3/uL (ref 150–400)
RBC: 3.61 MIL/uL — ABNORMAL LOW (ref 3.87–5.11)
RDW: 18.5 % — AB (ref 11.5–15.5)
WBC: 10.1 10*3/uL (ref 4.0–10.5)

## 2015-04-26 LAB — BASIC METABOLIC PANEL
Anion gap: 11 (ref 5–15)
BUN: 25 mg/dL — AB (ref 6–20)
CALCIUM: 8.1 mg/dL — AB (ref 8.9–10.3)
CO2: 28 mmol/L (ref 22–32)
CREATININE: 0.62 mg/dL (ref 0.44–1.00)
Chloride: 96 mmol/L — ABNORMAL LOW (ref 101–111)
Glucose, Bld: 313 mg/dL — ABNORMAL HIGH (ref 65–99)
Potassium: 4.3 mmol/L (ref 3.5–5.1)
Sodium: 135 mmol/L (ref 135–145)

## 2015-04-26 LAB — GLUCOSE, CAPILLARY
GLUCOSE-CAPILLARY: 282 mg/dL — AB (ref 65–99)
Glucose-Capillary: 225 mg/dL — ABNORMAL HIGH (ref 65–99)

## 2015-04-26 MED ORDER — HEPARIN SOD (PORK) LOCK FLUSH 100 UNIT/ML IV SOLN
500.0000 [IU] | Freq: Once | INTRAVENOUS | Status: AC
  Administered 2015-04-26: 500 [IU]
  Filled 2015-04-26: qty 5

## 2015-04-26 MED ORDER — ATORVASTATIN CALCIUM 80 MG PO TABS: 80.0000 mg | ORAL_TABLET | Freq: Every day | ORAL | Status: DC

## 2015-04-26 MED ORDER — TIOTROPIUM BROMIDE MONOHYDRATE 18 MCG IN CAPS: 18.0000 ug | ORAL_CAPSULE | Freq: Every day | RESPIRATORY_TRACT | Status: AC

## 2015-04-26 MED ORDER — BENZONATATE 100 MG PO CAPS: 100.0000 mg | ORAL_CAPSULE | Freq: Three times a day (TID) | ORAL | Status: DC

## 2015-04-26 MED ORDER — GLIMEPIRIDE 2 MG PO TABS: 4.0000 mg | ORAL_TABLET | Freq: Every day | ORAL | Status: DC

## 2015-04-26 MED ORDER — PREDNISONE 20 MG PO TABS: 20.0000 mg | ORAL_TABLET | ORAL | Status: DC

## 2015-04-26 NOTE — Progress Notes (Signed)
Pt o2 sats sustained above 90 % on RA.  Sats sustained in the mid 90's during ambulation. MD aware.  Went over all discharge paperwork, prescriptions given.  Port a cath [employer].  All questions answered.  Pt will be wheeled out when ride arrives.

## 2015-04-26 NOTE — Discharge Summary (Signed)
Physician Discharge Summary  Janice Ball NLZ:[id number] DOB: 24-Feb-1956 DOA: 04/20/2015  PCP: [name], NP  Admit date: 04/20/2015 Discharge date: 04/26/2015  Time spent: > 35 minutes  Recommendations for Outpatient Follow-up:  Patient will be discharged on short course of prednisone taper. Will increase glimepiride dose while on steroids but patient is to go back on her original home dose. - Monitor blood sugars and adjust hypoglycemic agents accordingly - Patient completed 7 days of antibiotics treatment as such we'll not continue antibiotics on discharge  Discharge Diagnoses:  Active Problems:   Primary cancer of right upper lobe of lung   DM type 2 (diabetes mellitus, type 2)   COPD with acute exacerbation   Chest pain   SVC syndrome   Acute exacerbation of chronic obstructive pulmonary disease (COPD)   Discharge Condition: Stable  Diet recommendation: Heart healthy  Filed Weights   04/20/15 1036  Weight: 78.6 kg (173 lb 4.5 oz)    History of present illness:  The patient is a 59 y.o. 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 In the ER, she was hypoxic to the mid-80s with trying to get to the edge of the stretcher to start ambulating, tachycardic to 110s. CXR demonstrated stable spiculated right upper lobe mass. CT angio chest was negative for PE and pneumonia and showed only stable RUL soft tissue density with trace right pleural effusion. She was given solumedrol, duoneb, levofloxacin. ECG demonstrated stable sinus tachycardic without ischemic changes and troponin was negative.   Hospital Course:  Acute hypoxic respiratory failure likely secondary to acute COPD exacerbation - Improved on steroids, bronchodilators, and antibiotics - Will discharge on  short course of prednisone taper, no antibiotics since patient received 7 days of antibiotics - Patient was ambulated and did not desaturate below 88%  Chest pain: Differential includes ACS, GERD, MSK pain. History, ECG, Age (0-45, 29-64, >=65), Risk factors (CAD, CVD, PVD, HLD, HTN, DM, tobacco, FH, obesity), Troponin. HEART Score = 2. Most likely she is having some MSK pain related to COPD exacerbation in the setting of malignancy. - Telemetry: Sinus tachycardia, d/c telemetry - Cycle troponins and ECG: Neg and stable - A1c 6.7 & lipid panel with LDL 95 and HDL 32 - Continue daily aspirin, beta blocker and high dose statin  SVC syndrome - Continue lovenox  DM - We'll increase glimepiride dose while patient is on sterile. But otherwise she is to go back on her home dose of 2 mg by mouth daily - Continue metformin last creatinine 0.62  Per known comorbidities we'll continue home medication regimen please refer to orders for clarification  Procedures:  None  Consultations:  None  Discharge Exam: Filed Vitals:   04/26/15 1345  BP: 119/58  Pulse: 106  Temp: 97.7 F (36.5 C)  Resp: 18    General: Pt in nad, alert and awake Cardiovascular: rrr, no mrg Respiratory: cta bl, no wheezes  Discharge Instructions   Discharge Instructions    Call MD for:  difficulty breathing, headache or visual disturbances    Complete by:  As directed      Call MD for:  redness, tenderness, or signs of infection (pain, swelling, redness, odor or green/yellow discharge around incision site)    Complete by:  As directed      Call MD for:  severe uncontrolled  pain    Complete by:  As directed      Call MD for:  temperature >100.4    Complete by:  As directed      Diet - low sodium heart healthy    Complete by:  As directed      Discharge instructions    Complete by:  As directed   Please follow-up  in one or 2 weeks with your pulmonologist or primary care physician for further  evaluation recommendations. Or sooner should any new concerns arise     Increase activity slowly    Complete by:  As directed           Current Discharge Medication List    START taking these medications   Details  atorvastatin (LIPITOR) 80 MG tablet Take 1 tablet (80 mg total) by mouth daily at 6 PM. Qty: 30 tablet, Refills: 0    benzonatate (TESSALON) 100 MG capsule Take 1 capsule (100 mg total) by mouth 3 (three) times daily. Qty: 20 capsule, Refills: 0    predniSONE (DELTASONE) 20 MG tablet Take 1 tablet (20 mg total) by mouth as directed. Take 40 mg by mouth for 2 days, then take 20 mg by mouth for the next 2 days, then take 10 mg by mouth for the next 2 days. Qty: 20 tablet, Refills: 0    tiotropium (SPIRIVA) 18 MCG inhalation capsule Place 1 capsule (18 mcg total) into inhaler and inhale daily. Qty: 30 capsule, Refills: 12      CONTINUE these medications which have CHANGED   Details  glimepiride (AMARYL) 2 MG tablet Take 2 tablets (4 mg total) by mouth daily with breakfast. Take higher dose (79mby mouth) while on prednisone. Once off prednisone go back to home regimen of 2 mg by mouth daily. Qty: 30 tablet, Refills: 0      CONTINUE these medications which have NOT CHANGED   Details  acetaminophen (TYLENOL) 325 MG tablet Take 650 mg by mouth every 6 (six) hours as needed for mild pain or headache.     alendronate (FOSAMAX) 10 MG tablet Take 10 mg by mouth daily.    Associated Diagnoses: Malignant neoplasm of bronchus and lung, unspecified site    budesonide-formoterol (SYMBICORT) 160-4.5 MCG/ACT inhaler Inhale 2 puffs into the lungs 2 (two) times daily.    cholecalciferol (VITAMIN D) 1000 UNITS tablet Take 1,000 Units by mouth every morning.     [employer] ASPIRIN ADULT LOW DOSE 81 MG chewable tablet Chew 81 mg by mouth daily. Refills: 0    cyanocobalamin 1000 MCG tablet Take 1,000 mcg by mouth every morning.     enoxaparin (LOVENOX) 120 MG/0.8ML injection Inject 0.8 mLs  (120 mg total) into the skin daily. Qty: 30 Syringe, Refills: 1    levothyroxine (SYNTHROID, LEVOTHROID) 137 MCG tablet Take 137 mcg by mouth daily.     LORazepam (ATIVAN) 1 MG tablet Take 1 mg by mouth 3 (three) times daily as needed for anxiety.     magnesium gluconate (MAGONATE) 500 MG tablet Take 500 mg by mouth every morning.     metFORMIN (GLUCOPHAGE) 1000 MG tablet Take 1,000 mg by mouth 2 (two) times daily with a meal.    Multiple Vitamin (MULTIVITAMIN WITH MINERALS) TABS tablet Take 1 tablet by mouth every morning.    PROAIR HFA 108 (90 BASE) MCG/ACT inhaler INHALE 2 PUFFS INTO THE LUNGS EVERY 6 (SIX) HOURS AS NEEDED FOR WHEEZING OR SHORTNESS OF BREATH. Qty: 1 Inhaler, Refills: 0  sertraline (ZOLOFT) 100 MG tablet Take 150 mg by mouth at bedtime.       STOP taking these medications     guaiFENesin (ROBITUSSIN) 100 MG/5ML SOLN      ipratropium (ATROVENT) 0.02 % nebulizer solution      OVER THE COUNTER MEDICATION      oxyCODONE (OXY IR/ROXICODONE) 5 MG immediate release tablet      prochlorperazine (COMPAZINE) 10 MG tablet      spironolactone (ALDACTONE) 25 MG tablet      lidocaine-prilocaine (EMLA) cream        Allergies  Allergen Reactions  . Carboplatin Rash    Skin test reaction  . Sulfonamide Derivatives Rash  . Codeine Other (See Comments)    hallucinations  . Dilaudid [Hydromorphone Hcl] Nausea And Vomiting and Other (See Comments)    Room started spinning.  Pt has been okay with low doses and nausea meds administered first  . Penicillins Other (See Comments)    Childhood allergy; reaction unknown  . Vicodin [Hydrocodone-Acetaminophen] Rash      The results of significant diagnostics from this hospitalization (including imaging, microbiology, ancillary and laboratory) are listed below for reference.    Significant Diagnostic Studies: Dg Chest 2 View  04/20/2015   CLINICAL DATA:  Increasing dyspnea.  Known lung cancer.  EXAM: CHEST  2 VIEW   COMPARISON:  01/18/2015  FINDINGS: There is a right jugular Port-A-Cath with tip in the low SVC. There is an SVC stent. There is a spiculated mass in the medial right upper lobe, not significantly changed. No superimposed acute infiltrate or congestive failure is evident. There are no effusions.  IMPRESSION: No acute findings.  Unchanged spiculated right upper lobe mass.   Electronically Signed   By: Andreas Newport M.D.   On: 04/20/2015 03:49   Ct Angio Chest Pe W/cm &/or Wo Cm  04/20/2015   CLINICAL DATA:  Shortness of Breath, tachycardia. Lung disease. Ongoing chemotherapy.  EXAM: CT ANGIOGRAPHY CHEST WITH CONTRAST  TECHNIQUE: Multidetector CT imaging of the chest was performed using the standard protocol during bolus administration of intravenous contrast. Multiplanar CT image reconstructions and MIPs were obtained to evaluate the vascular anatomy.  CONTRAST:  16m OMNIPAQUE IOHEXOL 350 MG/ML SOLN  COMPARISON:  02/17/2015  FINDINGS: No filling defects in the pulmonary arteries to suggest pulmonary emboli. Heart is normal size. Aortic atherosclerosis without aneurysm or dissection. SVC remains patent.  Abnormal soft tissue in the mediastinum and right paratracheal region, surrounding the trachea and right mainstem bronchus extending into the right hilum again noted and stable. Airspace opacity in the right upper lobe adjacent to the right hilum is again noted and is similar to prior study, likely radiation pneumonitis or fibrosis. Trace right pleural effusion. Stable small nodules in the left upper lobe. Otherwise, Left lung is clear.  Right chest wall Port-A-Cath remains in place, unchanged. Imaging into the upper abdomen shows no acute findings.  No acute bony abnormality or focal bone lesion. Collateral venous channels are noted throughout the chest wall and mediastinum likely related to prior SVC occlusion. Again, SVC appears patent currently.  Review of the MIP images confirms the above findings.   IMPRESSION: No evidence of pulmonary embolus.  Stable appearance of the confluent mediastinal and right perihilar/suprahilar soft tissue density.  Trace right effusion.   Electronically Signed   By: KRolm BaptiseM.D.   On: 04/20/2015 07:30    Microbiology: Recent Results (from the past 240 hour(s))  TECHNOLOGIST REVIEW  Status: None   Collection Time: 04/17/15 11:34 AM  Result Value Ref Range Status   Technologist Review Metas and Myelocytes present  Final     Labs: Basic Metabolic Panel:  Recent Labs Lab 04/22/15 0505 04/23/15 0530 04/24/15 0548 04/25/15 0415 04/26/15 0430  NA 138 137 135 136 135  K 5.3* 4.4 4.5 4.6 4.3  CL 100* 100* 96* 98* 96*  CO2 '29 28 30 30 28  '$ GLUCOSE 271* 255* 308* 338* 313*  BUN 20 18 23* 27* 25*  CREATININE 0.66 0.65 0.59 0.64 0.62  CALCIUM 7.8* 7.9* 7.9* 8.2* 8.1*   Liver Function Tests:  Recent Labs Lab 04/20/15 0340  AST 22  ALT 14  ALKPHOS 74  BILITOT 0.6  PROT 6.7  ALBUMIN 3.4*   No results for input(s): LIPASE, AMYLASE in the last 168 hours. No results for input(s): AMMONIA in the last 168 hours. CBC:  Recent Labs Lab 04/22/15 0505 04/23/15 0530 04/24/15 0548 04/25/15 0415 04/26/15 0430  WBC 9.0 7.2 8.4 9.8 10.1  HGB 8.7* 9.1* 8.9* 9.7* 10.5*  HCT 28.7* 29.2* 29.3* 30.8* 32.8*  MCV 95.3 93.9 91.8 90.6 90.9  PLT 233 229 255 303 356   Cardiac Enzymes:  Recent Labs Lab 04/20/15 1106 04/20/15 1636 04/20/15 2245  TROPONINI <0.03 <0.03 <0.03   BNP: BNP (last 3 results)  Recent Labs  11/03/14 1222 11/30/14 1450 04/20/15 0340  BNP 22.1 29.9 32.5    ProBNP (last 3 results) No results for input(s): PROBNP in the last 8760 hours.  CBG:  Recent Labs Lab 04/25/15 1148 04/25/15 1646 04/25/15 2115 04/26/15 0720 04/26/15 1153  GLUCAP 254* 363* 281* 282* 225*       Signed:  Velvet Bathe  [facility] 04/26/2015, 2:55 PM

## 2015-04-29 ENCOUNTER — Telehealth: Payer: Self-pay | Admitting: Internal Medicine

## 2015-04-29 NOTE — Telephone Encounter (Signed)
pt called and need to r/s md visit...i offered her the first available and she could not do...she took the next appt

## 2015-05-01 ENCOUNTER — Ambulatory Visit: Payer: Medicare Other

## 2015-05-01 ENCOUNTER — Other Ambulatory Visit: Payer: Medicare Other

## 2015-05-12 ENCOUNTER — Telehealth: Payer: Self-pay | Admitting: *Deleted

## 2015-05-12 ENCOUNTER — Ambulatory Visit (HOSPITAL_BASED_OUTPATIENT_CLINIC_OR_DEPARTMENT_OTHER): Payer: Medicare Other | Admitting: Physician Assistant

## 2015-05-12 ENCOUNTER — Encounter: Payer: Self-pay | Admitting: Physician Assistant

## 2015-05-12 ENCOUNTER — Telehealth: Payer: Self-pay | Admitting: Physician Assistant

## 2015-05-12 VITALS — BP 140/62 | HR 113 | Temp 97.8°F | Resp 20 | Ht 68.0 in | Wt 162.7 lb

## 2015-05-12 DIAGNOSIS — Z7901 Long term (current) use of anticoagulants: Secondary | ICD-10-CM

## 2015-05-12 DIAGNOSIS — Z86718 Personal history of other venous thrombosis and embolism: Secondary | ICD-10-CM

## 2015-05-12 DIAGNOSIS — C3411 Malignant neoplasm of upper lobe, right bronchus or lung: Secondary | ICD-10-CM

## 2015-05-12 NOTE — Patient Instructions (Signed)
Follow-up with Dr. Chase Caller as scheduled Return in 2 weeks to resume your systemic chemotherapy with single agent gemcitabine Follow-up in 3 weeks for reevaluation

## 2015-05-12 NOTE — Telephone Encounter (Signed)
Per staff message and POF I have scheduled appts. Advised scheduler of appts. JMW  

## 2015-05-12 NOTE — Telephone Encounter (Signed)
Pt confirmed labs/ov per 07/25 POF, gave pt AVS and Calendar..... KJ, sent msg to add chemo °

## 2015-05-12 NOTE — Progress Notes (Addendum)
[place] [phone]   [phone]  OFFICE PROGRESS NOTE  [name], NP [place] [address]  DIAGNOSIS: Recurrent non-small cell lung cancer, squamous cell carcinoma initially diagnosis stage IIIA (T1 N2 MX ) in August of 2010.   PRIOR THERAPY:  #1 status post concurrent chemoradiation with weekly carboplatin and paclitaxel, last dose of chemotherapy given 08/05/2011.  #2 status post consolidation chemotherapy with carboplatin paclitaxel last dose given 10/27/2009.  #3 Concurrent chemoradiation with weekly carboplatin for an AUC of 2 and paclitaxel at 45 mg per meter squared given concurrent with radiotherapy, last dose was given 09/20/2011.  #4 Systemic chemotherapy with gemcitabine 1000 mg meter squared on days 1 and 8 every 3 weeks status post 3 cycles with stable disease, last dose was given 12/20/2011.  #5 Systemic chemotherapy again with single agent gemcitabine 1000 mg/M2 on days 1 and 8 every 3 weeks. First cycle 11/07/2013. Status post 4 cycles. #6  Systemic chemotherapy again with single agent gemcitabine 1000 mg/M2 on days 1 and 8 every 3 weeks. First dose 07/24/2014. Discontinued today secondary to intolerance. #7  Immunotherapy with Nivolumab 3 MG/KG every 2 weeks. First dose 09/18/2014. She is status post 2 cycles. Last dose was given 10/02/2014 discontinued secondary to disease progression with significant SVC syndrome.  CURRENT THERAPY: Systemic chemotherapy with gemcitabine 800 MG/M2 on days 1 and 8 every 3 weeks. First dose 11/07/2014. Status post 8 cycles.  INTERVAL HISTORY: Janice Ball 59 y.o. female returns to the clinic today for follow-up visit. She was admitted to the hospital for an acute COPD exacerbation with hypoxia from 04/20/2015 through 04/26/2015. The patient is feeling better today with no specific complaints except for mild shortness of breath and hoarseness of her voice that she's  noted since she was discharged from the hospital. She was not admitted to the intensive care unit nor was she intubated during her admission. She reports her COPD medications were adjusted while she was in the hospital She is scheduled to see Dr. Chase Caller on 05/23/2015 for follow-up. She is tolerating her current treatment with single agent gemcitabine fairly well with no significant adverse effects. She denied any other symptoms. She continues to have a cough productive of clear secretions. She denied any current nausea. She denied any vomiting, diarrhea or constipation. She denied having any significant chest pain but has mild cough with no hemoptysis. She denied having any significant weight loss or night sweats. She denied having any nausea or vomiting. She has chills but no fever.   MEDICAL HISTORY: Past Medical History  Diagnosis Date  . Anxiety   . Hyperlipidemia   . Hypothyroidism   . COPD (chronic obstructive pulmonary disease)   . Arthritis     thumbs  . 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 Lung cancer dx'd 05/2009  . Lung cancer dx'd 09/2013    recurrence in LN  . Diabetes mellitus     ALLERGIES:  is allergic to carboplatin; sulfonamide derivatives; codeine; dilaudid; penicillins; and vicodin.  MEDICATIONS:  Current Outpatient Prescriptions  Medication Sig Dispense Refill  . acetaminophen (TYLENOL) 325 MG tablet Take 650 mg by mouth every 6 (six) hours as needed for mild pain or headache.     . alendronate (FOSAMAX) 10 MG tablet Take 10 mg by mouth daily.     Marland Kitchen atorvastatin (LIPITOR) 80  MG tablet Take 1 tablet (80 mg total) by mouth daily at 6 PM. 30 tablet 0  . benzonatate (TESSALON) 100 MG capsule Take 1 capsule (100 mg total) by mouth 3 (three) times daily. 20 capsule 0  . budesonide-formoterol (SYMBICORT) 160-4.5 MCG/ACT inhaler Inhale 2 puffs into the lungs 2 (two) times  daily.    . cholecalciferol (VITAMIN D) 1000 UNITS tablet Take 1,000 Units by mouth every morning.     . [facility] ASPIRIN ADULT LOW DOSE 81 MG chewable tablet Chew 81 mg by mouth daily.  0  . cyanocobalamin 1000 MCG tablet Take 1,000 mcg by mouth every morning.     . enoxaparin (LOVENOX) 120 MG/0.8ML injection Inject 0.8 mLs (120 mg total) into the skin daily. 30 Syringe 1  . glimepiride (AMARYL) 2 MG tablet Take 2 tablets (4 mg total) by mouth daily with breakfast. Take higher dose (22mby mouth) while on prednisone. Once off prednisone go back to home regimen of 2 mg by mouth daily. 30 tablet 0  . levothyroxine (SYNTHROID, LEVOTHROID) 137 MCG tablet Take 137 mcg by mouth daily.     .Marland KitchenLORazepam (ATIVAN) 1 MG tablet Take 1 mg by mouth 3 (three) times daily as needed for anxiety.     . magnesium gluconate (MAGONATE) 500 MG tablet Take 500 mg by mouth every morning.     . metFORMIN (GLUCOPHAGE) 1000 MG tablet Take 1,000 mg by mouth 2 (two) times daily with a meal.    . Multiple Vitamin (MULTIVITAMIN WITH MINERALS) TABS tablet Take 1 tablet by mouth every morning.    .Marland KitchenoxyCODONE (OXY IR/ROXICODONE) 5 MG immediate release tablet Take 5 mg by mouth every 4 (four) hours as needed for severe pain.    .Marland KitchenPROAIR HFA 108 (90 BASE) MCG/ACT inhaler INHALE 2 PUFFS INTO THE LUNGS EVERY 6 (SIX) HOURS AS NEEDED FOR WHEEZING OR SHORTNESS OF BREATH. 1 Inhaler 0  . sertraline (ZOLOFT) 100 MG tablet Take 150 mg by mouth at bedtime.     .Marland Kitchentiotropium (SPIRIVA) 18 MCG inhalation capsule Place 1 capsule (18 mcg total) into inhaler and inhale daily. 30 capsule 12   No current facility-administered medications for this visit.   Facility-Administered Medications Ordered in Other Visits  Medication Dose Route Frequency Provider Last Rate Last Dose  . sodium chloride 0.9 % injection 10 mL  10 mL Intracatheter PRN MCurt Bears MD   10 mL at 11/07/14 1558    SURGICAL HISTORY:  Past Surgical History  Procedure Laterality Date   . Thyroidectomy, partial    . Tonsillectomy    . Tubal ligation    . Partial hysterectomy      REVIEW OF SYSTEMS:  Constitutional: positive for fatigue Eyes: negative Ears, nose, mouth, throat, and face: negative Respiratory: positive for cough and dyspnea on exertion Cardiovascular: negative Gastrointestinal: negative Genitourinary:negative Integument/breast: negative Hematologic/lymphatic: negative Musculoskeletal:negative Neurological: negative Behavioral/Psych: negative Endocrine: negative Allergic/Immunologic: negative   PHYSICAL EXAMINATION: General appearance: alert, cooperative, no distress and Swelling of her face and puffiness of her eye Head: Normocephalic, without obvious abnormality, atraumatic Neck: no adenopathy, no JVD, supple, symmetrical, trachea midline and thyroid not enlarged, symmetric, no tenderness/mass/nodules Lymph nodes: Cervical, supraclavicular, and axillary nodes normal. Resp: wheezes bilaterally Back: symmetric, no curvature. ROM normal. No CVA tenderness. Cardio: regular rate and rhythm, S1, S2 normal, no murmur, click, rub or gallop GI: soft, non-tender; bowel sounds normal; no masses,  no organomegaly Extremities: extremities normal, atraumatic, no cyanosis or edema Neurologic: Alert and  oriented X 3, normal strength and tone. Normal symmetric reflexes. Normal coordination and gait  ECOG PERFORMANCE STATUS: 1 - Symptomatic but completely ambulatory  Blood pressure 140/62, pulse 113, temperature 97.8 F (36.6 C), temperature source Oral, resp. rate 20, height '5\' 8"'$  (1.727 m), weight 162 lb 11.2 oz (73.8 kg), SpO2 97 %.  LABORATORY DATA: Lab Results  Component Value Date   WBC 10.1 04/26/2015   HGB 10.5* 04/26/2015   HCT 32.8* 04/26/2015   MCV 90.9 04/26/2015   PLT 356 04/26/2015      Chemistry      Component Value Date/Time   NA 135 04/26/2015 0430   NA 138 04/17/2015 1134   NA 141 04/04/2012 0945   K 4.3 04/26/2015 0430   K 4.2  04/17/2015 1134   K 4.7 04/04/2012 0945   CL 96* 04/26/2015 0430   CL 101 04/06/2013 1041   CL 100 04/04/2012 0945   CO2 28 04/26/2015 0430   CO2 25 04/17/2015 1134   CO2 29 04/04/2012 0945   BUN 25* 04/26/2015 0430   BUN 4.8* 04/17/2015 1134   BUN 16 04/04/2012 0945   CREATININE 0.62 04/26/2015 0430   CREATININE 0.7 04/17/2015 1134   CREATININE 0.9 04/04/2012 0945      Component Value Date/Time   CALCIUM 8.1* 04/26/2015 0430   CALCIUM 8.3* 04/17/2015 1134   CALCIUM 8.4 04/04/2012 0945   ALKPHOS 74 04/20/2015 0340   ALKPHOS 74 04/17/2015 1134   ALKPHOS 61 04/04/2012 0945   AST 22 04/20/2015 0340   AST 21 04/17/2015 1134   AST 31 04/04/2012 0945   ALT 14 04/20/2015 0340   ALT 15 04/17/2015 1134   ALT 30 04/04/2012 0945   BILITOT 0.6 04/20/2015 0340   BILITOT 0.39 04/17/2015 1134   BILITOT 0.50 04/04/2012 0945       RADIOGRAPHIC STUDIES: Dg Chest 2 View  04/20/2015   CLINICAL DATA:  Increasing dyspnea.  Known lung cancer.  EXAM: CHEST  2 VIEW  COMPARISON:  01/18/2015  FINDINGS: There is a right jugular Port-A-Cath with tip in the low SVC. There is an SVC stent. There is a spiculated mass in the medial right upper lobe, not significantly changed. No superimposed acute infiltrate or congestive failure is evident. There are no effusions.  IMPRESSION: No acute findings.  Unchanged spiculated right upper lobe mass.   Electronically Signed   By: Andreas Newport M.D.   On: 04/20/2015 03:49   Ct Angio Chest Pe W/cm &/or Wo Cm  04/20/2015   CLINICAL DATA:  Shortness of Breath, tachycardia. Lung disease. Ongoing chemotherapy.  EXAM: CT ANGIOGRAPHY CHEST WITH CONTRAST  TECHNIQUE: Multidetector CT imaging of the chest was performed using the standard protocol during bolus administration of intravenous contrast. Multiplanar CT image reconstructions and MIPs were obtained to evaluate the vascular anatomy.  CONTRAST:  110m OMNIPAQUE IOHEXOL 350 MG/ML SOLN  COMPARISON:  02/17/2015  FINDINGS:  No filling defects in the pulmonary arteries to suggest pulmonary emboli. Heart is normal size. Aortic atherosclerosis without aneurysm or dissection. SVC remains patent.  Abnormal soft tissue in the mediastinum and right paratracheal region, surrounding the trachea and right mainstem bronchus extending into the right hilum again noted and stable. Airspace opacity in the right upper lobe adjacent to the right hilum is again noted and is similar to prior study, likely radiation pneumonitis or fibrosis. Trace right pleural effusion. Stable small nodules in the left upper lobe. Otherwise, Left lung is clear.  Right chest wall Port-A-Cath  remains in place, unchanged. Imaging into the upper abdomen shows no acute findings.  No acute bony abnormality or focal bone lesion. Collateral venous channels are noted throughout the chest wall and mediastinum likely related to prior SVC occlusion. Again, SVC appears patent currently.  Review of the MIP images confirms the above findings.  IMPRESSION: No evidence of pulmonary embolus.  Stable appearance of the confluent mediastinal and right perihilar/suprahilar soft tissue density.  Trace right effusion.   Electronically Signed   By: Rolm Baptise M.D.   On: 04/20/2015 07:30   ASSESSMENT AND PLAN: This is a very pleasant 59 years old white female with recurrent non-small cell lung cancer status post concurrent chemoradiation as well as consolidation chemotherapy and she is currently on systemic chemotherapy with single agent gemcitabine status post 4 cycles.  She was treated again with a course of systemic chemotherapy with single agent gemcitabine for 3 cycles but the patient has rough time tolerating her treatment with significant fatigue and weakness. The recent CT scan of the chest, abdomen and pelvis showed stable disease but the patient continues to have the persistent right upper lobe area of mass fibrosis and consolidation as well as persistent right hilar, subcarinal  and right paratracheal lymphadenopathy as well as, one area of masslike soft tissue immediately anterior to the carina. She was started on treatment with Nivolumab status post 2 cycles. She tolerated the treatment well but she presented with evidence for disease progression in the chest. This treatment was discontinued. 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 Further imaging studies showed evidence for disease progression. The patient was restarted on systemic chemotherapy initially with carboplatin and gemcitabine but carboplatin was discontinued secondary to hypersensitivity reaction to the carboplatin. She continued on treatment with single agent gemcitabine with reduced dose 800 MG/M2 on days 1 and 8 every 3 weeks status post 8 cycles. She is tolerating her treatment fairly well with no significant adverse effects. The recent CT angiography scan of the chest revealed stable disease.The patient was discussed with and also seen by Dr. [name]. We will plan to resume her systemic chemotherapy with single agent gemcitabine in approximately 2 weeks after she has been reevaluated by Dr. Chase Caller. It is unclear what is causing her hoarseness although the patient was treated with a large doses of steroids while she was in the hospital and this may be having some effect on her vocal cords causing the hoarseness.  She will follow-up in 3 weeks prior to the start of day 8 ofcycle #9 for reevaluation.  For the history of deep venous thrombosis she will continue on Lovenox subcutaneously on daily basis.  She was advised to call immediately if she has any concerning symptoms in the interval.  All questions were answered. The patient knows to call the clinic with any problems, questions or concerns. We can certainly see the patient much sooner if necessary.  Carlton Adam, PA-C 05/12/2015   ADDENDUM: Hematology/Oncology Attending: I had a face to face  encounter with the patient today. I recommended her care plan. This is a very pleasant 59 years old white female with recurrent non-small cell lung cancer, squamous cell carcinoma was currently on treatment with single agent gemcitabine 800 MG/M2 on days 1 and 8 every 3 weeks. The patient is tolerating her treatment well with no significant adverse effects. She was recently admitted to [facility] with COPD exacerbation as well as hoarseness of her voice. She was seen  by Dr. Chase Caller and started on treatment with steroid. She has another appointment with him in 10 days for reevaluation of her condition. I recommended for the patient to hold her treatment for now until after her visit with Dr. Chase Caller. His most recent CT scan of the chest showed no evidence for disease progression. I discussed the scan results with the patient and recommended for her to continue her treatment with gemcitabine in 2 weeks.  She will come back for follow-up visit in 3 weeks for reevaluation. The patient was advised to call immediately if she has any concerning symptoms in the interval.  Disclaimer: This note was dictated with voice recognition software. Similar sounding words can inadvertently be transcribed and may not be corrected upon review.  Eilleen Kempf., MD 05/12/2015

## 2015-05-13 ENCOUNTER — Other Ambulatory Visit: Payer: Self-pay | Admitting: Internal Medicine

## 2015-05-14 ENCOUNTER — Other Ambulatory Visit: Payer: Self-pay | Admitting: Internal Medicine

## 2015-05-19 ENCOUNTER — Other Ambulatory Visit: Payer: Self-pay | Admitting: Internal Medicine

## 2015-05-19 ENCOUNTER — Other Ambulatory Visit: Payer: Self-pay | Admitting: Medical Oncology

## 2015-05-19 DIAGNOSIS — C3411 Malignant neoplasm of upper lobe, right bronchus or lung: Secondary | ICD-10-CM

## 2015-05-19 MED ORDER — OXYCODONE HCL 5 MG PO TABS: 5.0000 mg | ORAL_TABLET | ORAL | Status: DC | PRN

## 2015-05-19 NOTE — Progress Notes (Signed)
rx locked up. 

## 2015-05-23 ENCOUNTER — Encounter: Payer: Self-pay | Admitting: Adult Health

## 2015-05-23 ENCOUNTER — Ambulatory Visit (INDEPENDENT_AMBULATORY_CARE_PROVIDER_SITE_OTHER): Payer: Medicare Other | Admitting: Adult Health

## 2015-05-23 VITALS — BP 118/68 | HR 92 | Temp 97.6°F | Ht 67.0 in | Wt 163.0 lb

## 2015-05-23 DIAGNOSIS — J449 Chronic obstructive pulmonary disease, unspecified: Secondary | ICD-10-CM | POA: Diagnosis not present

## 2015-05-23 DIAGNOSIS — C3411 Malignant neoplasm of upper lobe, right bronchus or lung: Secondary | ICD-10-CM

## 2015-05-23 DIAGNOSIS — R490 Dysphonia: Secondary | ICD-10-CM

## 2015-05-23 MED ORDER — BENZONATATE 200 MG PO CAPS: 200.0000 mg | ORAL_CAPSULE | Freq: Three times a day (TID) | ORAL | Status: DC | PRN

## 2015-05-23 NOTE — Assessment & Plan Note (Signed)
Cont follow up with oncology as planned

## 2015-05-23 NOTE — Assessment & Plan Note (Signed)
?   Secondary to sinus drainage   Plan  Salt water gargles As needed   Zyrtec '10mg'$  At bedtime for drainage.  Follow up with oncology next week as planned  follow up Dr. Chase Caller in 2-3 months and As needed

## 2015-05-23 NOTE — Assessment & Plan Note (Signed)
Recent flare now resolved   Plan  Stop Ipratropium nebs  Continue on Symbciort and Spriiva . -rinse well after use Salt water gargles As needed   Zyrtec '10mg'$  At bedtime for drainage.  Follow up with oncology next week as planned  follow up Dr. Chase Caller in 2-3 months and As needed

## 2015-05-23 NOTE — Progress Notes (Signed)
   Subjective:    Patient ID: Janice Ball, female    DOB: Sep 15, 1956, 59 y.o.   MRN: [id number]  HPI 59 yo female with Moderate COPD -GOLD II  Gold stage 2 COPD - MM genotype and class II dyspnea - July 2012: PFT today that showed preserved lung fxn -mild COPD - FEV1 2.19 L/M (80%), ratio of 66. No sign. Change with SABA . DLCO 65%  Recurrent non-small cell lung cancer, squamous cell carcinoma initially diagnosis stage IIIA (T1 N2 MX ) in August of 2010.  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    05/23/2015 Post hospital follow up  : COPD , Lung Cancer  Returns for a post hospital follow up.  Recent admission on 7/9 with AECOPD . tx w/ abx and steroids .  CT chest on July 3 showed no pulmonary embolism, stable mediastinal adenopathy along the right. Perihilar  soft tissue density  She is feeling better since discharge , has some lingering dry cough , dyspnea. Has some hoarseness.  No sore throat, chest pain, orthopnea, edema.   Has recurrent lung cancer. Following with oncology .  Restarts chemo next week.   Taking spiriva and atrovent neb Four times a day      Review of Systems Constitutional:   No  weight loss, night sweats,  Fevers, chills, + fatigue, or  lassitude.  HEENT:   No headaches,  Difficulty swallowing,  Tooth/dental problems, or  Sore throat,                No sneezing, itching, ear ache, nasal congestion, post nasal drip,   CV:  No chest pain,  Orthopnea, PND, swelling in lower extremities, anasarca, dizziness, palpitations, syncope.   GI  No heartburn, indigestion, abdominal pain, nausea, vomiting, diarrhea, change in bowel habits, loss of appetite, bloody stools.   Resp:   No chest wall deformity  Skin: no rash or lesions.  GU: no dysuria, change in color of urine, no urgency or frequency.  No flank pain, no hematuria   MS:  No joint pain or swelling.  No decreased range of motion.  No back pain.  Psych:  No change in mood or affect. No depression or anxiety.  No  memory loss.         Objective:   Physical Exam GEN: A/Ox3; pleasant , NAD, chronically ill appearing   HEENT:  [place]/AT,  EACs-clear, TMs-wnl, NOSE-clear, THROAT-clear, no lesions, no postnasal drip or exudate noted.   NECK:  Supple w/ fair ROM; no JVD; normal carotid impulses w/o bruits; no thyromegaly or nodules palpated; no lymphadenopathy.  RESP  Decreased BS in bases , no accessory muscle use, no dullness to percussion  CARD:  RRR, no m/r/g  , no peripheral edema, pulses intact, no cyanosis or clubbing.  GI:   Soft & nt; nml bowel sounds; no organomegaly or masses detected.  Musco: Warm bil, no deformities or joint swelling noted.   Neuro: alert, no focal deficits noted.    Skin: Warm, no lesions or rashes         Assessment & Plan:

## 2015-05-23 NOTE — Patient Instructions (Addendum)
Stop Ipratropium nebs  Continue on Symbciort and Spriiva . -rinse well after use Salt water gargles As needed   Zyrtec '10mg'$  At bedtime for drainage.  Follow up with oncology next week as planned  follow up Dr. Chase Caller in 2-3 months and As needed   Please contact office for sooner follow up if symptoms do not improve or worsen or seek emergency care

## 2015-05-27 ENCOUNTER — Other Ambulatory Visit: Payer: Self-pay | Admitting: Internal Medicine

## 2015-05-29 ENCOUNTER — Ambulatory Visit (HOSPITAL_BASED_OUTPATIENT_CLINIC_OR_DEPARTMENT_OTHER): Payer: Medicare Other

## 2015-05-29 ENCOUNTER — Other Ambulatory Visit (HOSPITAL_BASED_OUTPATIENT_CLINIC_OR_DEPARTMENT_OTHER): Payer: Medicare Other

## 2015-05-29 DIAGNOSIS — C3411 Malignant neoplasm of upper lobe, right bronchus or lung: Secondary | ICD-10-CM | POA: Diagnosis not present

## 2015-05-29 DIAGNOSIS — Z5111 Encounter for antineoplastic chemotherapy: Secondary | ICD-10-CM

## 2015-05-29 DIAGNOSIS — C3481 Malignant neoplasm of overlapping sites of right bronchus and lung: Secondary | ICD-10-CM

## 2015-05-29 LAB — COMPREHENSIVE METABOLIC PANEL (CC13)
ALK PHOS: 87 U/L (ref 40–150)
ALT: 14 U/L (ref 0–55)
AST: 22 U/L (ref 5–34)
Albumin: 3 g/dL — ABNORMAL LOW (ref 3.5–5.0)
Anion Gap: 8 mEq/L (ref 3–11)
BUN: 5.9 mg/dL — ABNORMAL LOW (ref 7.0–26.0)
CHLORIDE: 103 meq/L (ref 98–109)
CO2: 25 mEq/L (ref 22–29)
Calcium: 8.1 mg/dL — ABNORMAL LOW (ref 8.4–10.4)
Creatinine: 0.8 mg/dL (ref 0.6–1.1)
EGFR: 83 mL/min/{1.73_m2} — ABNORMAL LOW (ref >90)
Glucose: 299 mg/dl — ABNORMAL HIGH (ref 70–140)
POTASSIUM: 4.1 meq/L (ref 3.5–5.1)
Sodium: 137 mEq/L (ref 136–145)
TOTAL PROTEIN: 6.8 g/dL (ref 6.4–8.3)
Total Bilirubin: 0.39 mg/dL (ref 0.20–1.20)

## 2015-05-29 LAB — CBC WITH DIFFERENTIAL/PLATELET
BASO%: 0.9 % (ref 0.0–2.0)
Basophils Absolute: 0.1 10*3/uL (ref 0.0–0.1)
EOS%: 1 % (ref 0.0–7.0)
Eosinophils Absolute: 0.1 10*3/uL (ref 0.0–0.5)
HCT: 34.6 % — ABNORMAL LOW (ref 34.8–46.6)
HGB: 11 g/dL — ABNORMAL LOW (ref 11.6–15.9)
LYMPH#: 0.7 10*3/uL — AB (ref 0.9–3.3)
LYMPH%: 12 % — ABNORMAL LOW (ref 14.0–49.7)
MCH: 26.8 pg (ref 25.1–34.0)
MCHC: 31.8 g/dL (ref 31.5–36.0)
MCV: 84.4 fL (ref 79.5–101.0)
MONO#: 0.4 10*3/uL (ref 0.1–0.9)
MONO%: 6.6 % (ref 0.0–14.0)
NEUT%: 79.5 % — ABNORMAL HIGH (ref 38.4–76.8)
NEUTROS ABS: 4.9 10*3/uL (ref 1.5–6.5)
Platelets: 196 10*3/uL (ref 145–400)
RBC: 4.1 10*6/uL (ref 3.70–5.45)
RDW: 18 % — AB (ref 11.2–14.5)
WBC: 6.1 10*3/uL (ref 3.9–10.3)

## 2015-05-29 MED ORDER — HEPARIN SOD (PORK) LOCK FLUSH 100 UNIT/ML IV SOLN
500.0000 [IU] | Freq: Once | INTRAVENOUS | Status: AC | PRN
  Administered 2015-05-29: 500 [IU]
  Filled 2015-05-29: qty 5

## 2015-05-29 MED ORDER — SODIUM CHLORIDE 0.9 % IV SOLN
800.0000 mg/m2 | Freq: Once | INTRAVENOUS | Status: AC
  Administered 2015-05-29: 1558 mg via INTRAVENOUS
  Filled 2015-05-29: qty 40.98

## 2015-05-29 MED ORDER — SODIUM CHLORIDE 0.9 % IV SOLN: Freq: Once | INTRAVENOUS | Status: DC

## 2015-05-29 MED ORDER — LORAZEPAM 2 MG/ML IJ SOLN
INTRAMUSCULAR | Status: AC
  Filled 2015-05-29: qty 1

## 2015-05-29 MED ORDER — SODIUM CHLORIDE 0.9 % IJ SOLN
10.0000 mL | INTRAMUSCULAR | Status: DC | PRN
  Administered 2015-05-29: 10 mL
  Filled 2015-05-29: qty 10

## 2015-05-29 MED ORDER — LORAZEPAM 2 MG/ML IJ SOLN
0.5000 mg | Freq: Once | INTRAMUSCULAR | Status: AC
  Administered 2015-05-29: 0.5 mg via INTRAVENOUS

## 2015-05-29 NOTE — Patient Instructions (Signed)
[facility] Discharge Instructions for Patients Receiving Chemotherapy  Today you received the following chemotherapy agents gemzar  To help prevent nausea and vomiting after your treatment, we encourage you to take your nausea medication.   If you develop nausea and vomiting that is not controlled by your nausea medication, call the clinic.   BELOW ARE SYMPTOMS THAT SHOULD BE REPORTED IMMEDIATELY:  *FEVER GREATER THAN 100.5 F  *CHILLS WITH OR WITHOUT FEVER  NAUSEA AND VOMITING THAT IS NOT CONTROLLED WITH YOUR NAUSEA MEDICATION  *UNUSUAL SHORTNESS OF BREATH  *UNUSUAL BRUISING OR BLEEDING  TENDERNESS IN MOUTH AND THROAT WITH OR WITHOUT PRESENCE OF ULCERS  *URINARY PROBLEMS  *BOWEL PROBLEMS  UNUSUAL RASH Items with * indicate a potential emergency and should be followed up as soon as possible.  Feel free to call the clinic you have any questions or concerns. The clinic phone number is [phone].  Please show the [facility] at check-in to the Emergency Department and triage nurse.

## 2015-05-29 NOTE — Progress Notes (Signed)
Patient arrived nauseated and admits to taking compazine 10 mg, 20 minutes prior to her arrival.  Noted patient's heart rate was elevated.  Spoke with Cyndee, NP who gave a verbal order to hold the compazine, pre medication.  Ativan 0.5 mg ordered and given prior to infusion.  Patient reports not feeling nauseated prior to ativan.

## 2015-05-30 ENCOUNTER — Other Ambulatory Visit: Payer: Self-pay | Admitting: *Deleted

## 2015-05-30 DIAGNOSIS — C3411 Malignant neoplasm of upper lobe, right bronchus or lung: Secondary | ICD-10-CM

## 2015-05-30 MED ORDER — OXYCODONE HCL 5 MG PO TABS: 5.0000 mg | ORAL_TABLET | ORAL | Status: DC | PRN

## 2015-06-03 ENCOUNTER — Telehealth: Payer: Self-pay | Admitting: Internal Medicine

## 2015-06-03 MED ORDER — PREDNISONE 10 MG PO TABS: ORAL_TABLET | ORAL | Status: DC

## 2015-06-03 NOTE — Telephone Encounter (Signed)
Probably the heat causing a mild flare up in COPD  Plan Please take prednisone 40 mg x1 day, then 30 mg x1 day, then 20 mg x1 day, then 10 mg x1 day, and then 5 mg x1 day and stop

## 2015-06-03 NOTE — Telephone Encounter (Signed)
Pt aware of rec's per MR Rx [facility] Nothing further needed.

## 2015-06-03 NOTE — Telephone Encounter (Signed)
No voice; SOB, prod cough w/white mucus; no fever; chest tightness and chest pain, back pain between shoulder blades. Tried Zyrtec and Claritan. States she has been like this for some weeks. MR please advise.

## 2015-06-05 ENCOUNTER — Telehealth: Payer: Self-pay | Admitting: Internal Medicine

## 2015-06-05 ENCOUNTER — Ambulatory Visit (HOSPITAL_BASED_OUTPATIENT_CLINIC_OR_DEPARTMENT_OTHER): Payer: Medicare Other

## 2015-06-05 ENCOUNTER — Encounter: Payer: Self-pay | Admitting: Internal Medicine

## 2015-06-05 ENCOUNTER — Other Ambulatory Visit (HOSPITAL_BASED_OUTPATIENT_CLINIC_OR_DEPARTMENT_OTHER): Payer: Medicare Other

## 2015-06-05 ENCOUNTER — Ambulatory Visit (HOSPITAL_BASED_OUTPATIENT_CLINIC_OR_DEPARTMENT_OTHER): Payer: Medicare Other | Admitting: Internal Medicine

## 2015-06-05 VITALS — BP 136/65 | HR 117 | Temp 98.1°F | Resp 18 | Ht 67.0 in | Wt 165.0 lb

## 2015-06-05 DIAGNOSIS — C3411 Malignant neoplasm of upper lobe, right bronchus or lung: Secondary | ICD-10-CM

## 2015-06-05 DIAGNOSIS — Z5111 Encounter for antineoplastic chemotherapy: Secondary | ICD-10-CM

## 2015-06-05 DIAGNOSIS — Z7901 Long term (current) use of anticoagulants: Secondary | ICD-10-CM

## 2015-06-05 DIAGNOSIS — Z86718 Personal history of other venous thrombosis and embolism: Secondary | ICD-10-CM

## 2015-06-05 DIAGNOSIS — C3481 Malignant neoplasm of overlapping sites of right bronchus and lung: Secondary | ICD-10-CM

## 2015-06-05 LAB — COMPREHENSIVE METABOLIC PANEL (CC13)
ALBUMIN: 3 g/dL — AB (ref 3.5–5.0)
ALK PHOS: 67 U/L (ref 40–150)
ALT: 26 U/L (ref 0–55)
ANION GAP: 11 meq/L (ref 3–11)
AST: 21 U/L (ref 5–34)
BILIRUBIN TOTAL: 0.23 mg/dL (ref 0.20–1.20)
BUN: 9.6 mg/dL (ref 7.0–26.0)
CALCIUM: 8.5 mg/dL (ref 8.4–10.4)
CO2: 25 mEq/L (ref 22–29)
CREATININE: 0.7 mg/dL (ref 0.6–1.1)
Chloride: 104 mEq/L (ref 98–109)
EGFR: >90 mL/min/{1.73_m2} (ref >90)
Glucose: 148 mg/dl — ABNORMAL HIGH (ref 70–140)
Potassium: 3.9 mEq/L (ref 3.5–5.1)
Sodium: 140 mEq/L (ref 136–145)
TOTAL PROTEIN: 6.6 g/dL (ref 6.4–8.3)

## 2015-06-05 LAB — CBC WITH DIFFERENTIAL/PLATELET
BASO%: 0.6 % (ref 0.0–2.0)
BASOS ABS: 0 10*3/uL (ref 0.0–0.1)
EOS%: 0.2 % (ref 0.0–7.0)
Eosinophils Absolute: 0 10*3/uL (ref 0.0–0.5)
HCT: 30.8 % — ABNORMAL LOW (ref 34.8–46.6)
HEMOGLOBIN: 10 g/dL — AB (ref 11.6–15.9)
LYMPH%: 30 % (ref 14.0–49.7)
MCH: 26.9 pg (ref 25.1–34.0)
MCHC: 32.6 g/dL (ref 31.5–36.0)
MCV: 82.6 fL (ref 79.5–101.0)
MONO#: 0.4 10*3/uL (ref 0.1–0.9)
MONO%: 11.5 % (ref 0.0–14.0)
NEUT#: 1.9 10*3/uL (ref 1.5–6.5)
NEUT%: 57.7 % (ref 38.4–76.8)
PLATELETS: 133 10*3/uL — AB (ref 145–400)
RBC: 3.74 10*6/uL (ref 3.70–5.45)
RDW: 17.1 % — AB (ref 11.2–14.5)
WBC: 3.3 10*3/uL — ABNORMAL LOW (ref 3.9–10.3)
lymph#: 1 10*3/uL (ref 0.9–3.3)

## 2015-06-05 MED ORDER — RIVAROXABAN (XARELTO) VTE STARTER PACK (15 & 20 MG): ORAL_TABLET | ORAL | Status: DC

## 2015-06-05 MED ORDER — SODIUM CHLORIDE 0.9 % IJ SOLN
10.0000 mL | INTRAMUSCULAR | Status: DC | PRN
  Administered 2015-06-05: 10 mL
  Filled 2015-06-05: qty 10

## 2015-06-05 MED ORDER — SODIUM CHLORIDE 0.9 % IV SOLN
Freq: Once | INTRAVENOUS | Status: AC
  Administered 2015-06-05: 11:00:00 via INTRAVENOUS

## 2015-06-05 MED ORDER — PROCHLORPERAZINE MALEATE 10 MG PO TABS
10.0000 mg | ORAL_TABLET | Freq: Once | ORAL | Status: AC
  Administered 2015-06-05: 10 mg via ORAL

## 2015-06-05 MED ORDER — OXYCODONE HCL 5 MG PO TABS
5.0000 mg | ORAL_TABLET | ORAL | Status: DC | PRN
Start: 2015-06-05 — End: 2015-06-25

## 2015-06-05 MED ORDER — SODIUM CHLORIDE 0.9 % IV SOLN
800.0000 mg/m2 | Freq: Once | INTRAVENOUS | Status: AC
  Administered 2015-06-05: 1558 mg via INTRAVENOUS
  Filled 2015-06-05: qty 40.98

## 2015-06-05 MED ORDER — HEPARIN SOD (PORK) LOCK FLUSH 100 UNIT/ML IV SOLN
500.0000 [IU] | Freq: Once | INTRAVENOUS | Status: AC | PRN
  Administered 2015-06-05: 500 [IU]
  Filled 2015-06-05: qty 5

## 2015-06-05 MED ORDER — PROCHLORPERAZINE MALEATE 10 MG PO TABS
ORAL_TABLET | ORAL | Status: AC
  Filled 2015-06-05: qty 1

## 2015-06-05 NOTE — Patient Instructions (Signed)
[facility] Discharge Instructions for Patients Receiving Chemotherapy  Today you received the following chemotherapy agents: Gemcitabine   To help prevent nausea and vomiting after your treatment, we encourage you to take your nausea medication as prescribed.   If you develop nausea and vomiting that is not controlled by your nausea medication, call the clinic.   BELOW ARE SYMPTOMS THAT SHOULD BE REPORTED IMMEDIATELY:  *FEVER GREATER THAN 100.5 F  *CHILLS WITH OR WITHOUT FEVER  NAUSEA AND VOMITING THAT IS NOT CONTROLLED WITH YOUR NAUSEA MEDICATION  *UNUSUAL SHORTNESS OF BREATH  *UNUSUAL BRUISING OR BLEEDING  TENDERNESS IN MOUTH AND THROAT WITH OR WITHOUT PRESENCE OF ULCERS  *URINARY PROBLEMS  *BOWEL PROBLEMS  UNUSUAL RASH Items with * indicate a potential emergency and should be followed up as soon as possible.  Feel free to call the clinic you have any questions or concerns. The clinic phone number is [phone].  Please show the CHEMO ALERT CARD at check-in to the Emergency Department and triage nurse.    

## 2015-06-05 NOTE — Progress Notes (Signed)
[facility] [phone]   [phone]  OFFICE PROGRESS NOTE  Janice Chimes, NP [place] [address]  DIAGNOSIS: Recurrent non-small cell lung cancer, squamous cell carcinoma initially diagnosis stage IIIA (T1 N2 MX ) in August of 2010.   PRIOR THERAPY:  #1 status post concurrent chemoradiation with weekly carboplatin and paclitaxel, last dose of chemotherapy given 08/05/2011.  #2 status post consolidation chemotherapy with carboplatin paclitaxel last dose given 10/27/2009.  #3 Concurrent chemoradiation with weekly carboplatin for an AUC of 2 and paclitaxel at 45 mg per meter squared given concurrent with radiotherapy, last dose was given 09/20/2011.  #4 Systemic chemotherapy with gemcitabine 1000 mg meter squared on days 1 and 8 every 3 weeks status post 3 cycles with stable disease, last dose was given 12/20/2011.  #5 Systemic chemotherapy again with single agent gemcitabine 1000 mg/M2 on days 1 and 8 every 3 weeks. First cycle 11/07/2013. Status post 4 cycles. #6  Systemic chemotherapy again with single agent gemcitabine 1000 mg/M2 on days 1 and 8 every 3 weeks. First dose 07/24/2014. Discontinued today secondary to intolerance. #7  Immunotherapy with Nivolumab 3 MG/KG every 2 weeks. First dose 09/18/2014. She is status post 2 cycles. Last dose was given 10/02/2014 discontinued secondary to disease progression with significant SVC syndrome.  CURRENT THERAPY: Systemic chemotherapy with gemcitabine 800 MG/M2 on days 1 and 8 every 3 weeks. First dose 11/07/2014. Status post 8 cycles.  INTERVAL HISTORY: Janice Ball 59 y.o. female returns to the clinic today for follow-up visit. The patient is feeling fine today with no specific complaints except for mild shortness of breath. She is tolerating her current treatment with single agent gemcitabine fairly well with no significant adverse effects. She was started recently on  treatment with prednisone by Dr. Chase Caller. She has some hoarseness of her voice after starting the prednisone. She denied having any significant chest pain but has mild cough with no hemoptysis. She denied having any significant weight loss or night sweats. She denied having any nausea or vomiting. She has no fever or chills.   MEDICAL HISTORY: Past Medical History  Diagnosis Date  . Anxiety   . Hyperlipidemia   . Hypothyroidism   . COPD (chronic obstructive pulmonary disease)   . Arthritis     thumbs  . 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 Lung cancer dx'd 05/2009  . Lung cancer dx'd 09/2013    recurrence in LN  . Diabetes mellitus     ALLERGIES:  is allergic to carboplatin; sulfonamide derivatives; codeine; dilaudid; penicillins; and vicodin.  MEDICATIONS:  Current Outpatient Prescriptions  Medication Sig Dispense Refill  . acetaminophen (TYLENOL) 325 MG tablet Take 650 mg by mouth every 6 (six) hours as needed for mild pain or headache.     . alendronate (FOSAMAX) 10 MG tablet Take 10 mg by mouth daily.     Marland Kitchen atorvastatin (LIPITOR) 80 MG tablet Take 1 tablet (80 mg total) by mouth daily at 6 PM. 30 tablet 0  . benzonatate (TESSALON) 200 MG capsule Take 1 capsule (200 mg total) by mouth 3 (three) times daily as needed for cough. 30 capsule 1  . budesonide-formoterol (SYMBICORT) 160-4.5 MCG/ACT inhaler Inhale 2 puffs into the lungs 2 (two) times daily.    . Cetirizine HCl (ZYRTEC ALLERGY) 10 MG CAPS Take 1 capsule by mouth daily  as needed.    . cholecalciferol (VITAMIN D) 1000 UNITS tablet Take 1,000 Units by mouth every morning.     . [facility] ASPIRIN ADULT LOW DOSE 81 MG chewable tablet Chew 81 mg by mouth daily.  0  . cyanocobalamin 1000 MCG tablet Take 1,000 mcg by mouth every morning.     . enoxaparin (LOVENOX) 120 MG/0.8ML injection INJECT 0.8MLS (1 SYRINGE) INTO THE SKIN DAILY  24 Syringe 1  . glimepiride (AMARYL) 2 MG tablet Take 2 tablets (4 mg total) by mouth daily with breakfast. Take higher dose (37mby mouth) while on prednisone. Once off prednisone go back to home regimen of 2 mg by mouth daily. 30 tablet 0  . guaiFENesin (MUCINEX) 600 MG 12 hr tablet Take by mouth 2 (two) times daily.    .Marland Kitchenipratropium (ATROVENT) 0.02 % nebulizer solution Inhale 1 mL into the lungs 4 (four) times daily as needed.  1  . levothyroxine (SYNTHROID, LEVOTHROID) 137 MCG tablet Take 137 mcg by mouth daily.     .Marland KitchenLORazepam (ATIVAN) 1 MG tablet Take 1 mg by mouth 3 (three) times daily as needed for anxiety.     . magnesium gluconate (MAGONATE) 500 MG tablet Take 500 mg by mouth every morning.     . metFORMIN (GLUCOPHAGE) 1000 MG tablet Take 1,000 mg by mouth 2 (two) times daily with a meal.    . Multiple Vitamin (MULTIVITAMIN WITH MINERALS) TABS tablet Take 1 tablet by mouth every morning.    .Marland KitchenoxyCODONE (OXY IR/ROXICODONE) 5 MG immediate release tablet Take 1 tablet (5 mg total) by mouth every 4 (four) hours as needed for severe pain. 30 tablet 0  . predniSONE (DELTASONE) 10 MG tablet Take '40mg'$  x 1 day, then '30mg'$  x1 day, then '20mg'$  x1 day, then '10mg'$  x1 day, then '5mg'$  x1 day and stop 11 tablet 0  . PROAIR HFA 108 (90 BASE) MCG/ACT inhaler INHALE 2 PUFFS INTO THE LUNGS EVERY 6 (SIX) HOURS AS NEEDED FOR WHEEZING OR SHORTNESS OF BREATH. 8.5 Inhaler 0  . prochlorperazine (COMPAZINE) 10 MG tablet Take 10 mg by mouth every 6 (six) hours as needed for nausea or vomiting.    . sertraline (ZOLOFT) 100 MG tablet Take 150 mg by mouth at bedtime.     .Marland Kitchenspironolactone (ALDACTONE) 25 MG tablet Take 25 mg by mouth every morning.  4  . SYMBICORT 160-4.5 MCG/ACT inhaler INHALE 2 PUFFS INTO THE LUNGS 2 (TWO) TIMES DAILY. 10.2 Inhaler 5  . tiotropium (SPIRIVA) 18 MCG inhalation capsule Place 1 capsule (18 mcg total) into inhaler and inhale daily. 30 capsule 12   No current facility-administered medications for  this visit.   Facility-Administered Medications Ordered in Other Visits  Medication Dose Route Frequency Provider Last Rate Last Dose  . sodium chloride 0.9 % injection 10 mL  10 mL Intracatheter PRN MCurt Bears MD   10 mL at 11/07/14 1558    SURGICAL HISTORY:  Past Surgical History  Procedure Laterality Date  . Thyroidectomy, partial    . Tonsillectomy    . Tubal ligation    . Partial hysterectomy      REVIEW OF SYSTEMS:  Constitutional: positive for fatigue Eyes: negative Ears, nose, mouth, throat, and face: negative Respiratory: positive for cough and dyspnea on exertion Cardiovascular: negative Gastrointestinal: negative Genitourinary:negative Integument/breast: negative Hematologic/lymphatic: negative Musculoskeletal:negative Neurological: negative Behavioral/Psych: negative Endocrine: negative Allergic/Immunologic: negative   PHYSICAL EXAMINATION: General appearance: alert, cooperative, no distress and Swelling of her face and puffiness of  her eye Head: Normocephalic, without obvious abnormality, atraumatic Neck: no adenopathy, no JVD, supple, symmetrical, trachea midline and thyroid not enlarged, symmetric, no tenderness/mass/nodules Lymph nodes: Cervical, supraclavicular, and axillary nodes normal. Resp: wheezes bilaterally Back: symmetric, no curvature. ROM normal. No CVA tenderness. Cardio: regular rate and rhythm, S1, S2 normal, no murmur, click, rub or gallop GI: soft, non-tender; bowel sounds normal; no masses,  no organomegaly Extremities: extremities normal, atraumatic, no cyanosis or edema Neurologic: Alert and oriented X 3, normal strength and tone. Normal symmetric reflexes. Normal coordination and gait  ECOG PERFORMANCE STATUS: 1 - Symptomatic but completely ambulatory  Blood pressure 136/65, pulse 117, temperature 98.1 F (36.7 C), temperature source Oral, resp. rate 18, height '5\' 7"'$  (1.702 m), weight 165 lb (74.844 kg), SpO2 97 %.  LABORATORY  DATA: Lab Results  Component Value Date   WBC 3.3* 06/05/2015   HGB 10.0* 06/05/2015   HCT 30.8* 06/05/2015   MCV 82.6 06/05/2015   PLT 133* 06/05/2015      Chemistry      Component Value Date/Time   NA 137 05/29/2015 1132   NA 135 04/26/2015 0430   NA 141 04/04/2012 0945   K 4.1 05/29/2015 1132   K 4.3 04/26/2015 0430   K 4.7 04/04/2012 0945   CL 96* 04/26/2015 0430   CL 101 04/06/2013 1041   CL 100 04/04/2012 0945   CO2 25 05/29/2015 1132   CO2 28 04/26/2015 0430   CO2 29 04/04/2012 0945   BUN 5.9* 05/29/2015 1132   BUN 25* 04/26/2015 0430   BUN 16 04/04/2012 0945   CREATININE 0.8 05/29/2015 1132   CREATININE 0.62 04/26/2015 0430   CREATININE 0.9 04/04/2012 0945      Component Value Date/Time   CALCIUM 8.1* 05/29/2015 1132   CALCIUM 8.1* 04/26/2015 0430   CALCIUM 8.4 04/04/2012 0945   ALKPHOS 87 05/29/2015 1132   ALKPHOS 74 04/20/2015 0340   ALKPHOS 61 04/04/2012 0945   AST 22 05/29/2015 1132   AST 22 04/20/2015 0340   AST 31 04/04/2012 0945   ALT 14 05/29/2015 1132   ALT 14 04/20/2015 0340   ALT 30 04/04/2012 0945   BILITOT 0.39 05/29/2015 1132   BILITOT 0.6 04/20/2015 0340   BILITOT 0.50 04/04/2012 0945       RADIOGRAPHIC STUDIES: No results found. ASSESSMENT AND PLAN: This is a very pleasant 59 years old white female with recurrent non-small cell lung cancer status post concurrent chemoradiation as well as consolidation chemotherapy and she is currently on systemic chemotherapy with single agent gemcitabine status post 4 cycles.  She was treated again with a course of systemic chemotherapy with single agent gemcitabine for 3 cycles but the patient has rough time tolerating her treatment with significant fatigue and weakness. The recent CT scan of the chest, abdomen and pelvis showed stable disease but the patient continues to have the persistent right upper lobe area of mass fibrosis and consolidation as well as persistent right hilar, subcarinal and right  paratracheal lymphadenopathy as well as, one area of masslike soft tissue immediately anterior to the carina. She was started on treatment with Nivolumab status post 2 cycles. She tolerated the treatment well but she presented with evidence for disease progression in the chest. This treatment was discontinued. 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 Further imaging studies showed evidence for disease progression. The patient was restarted on systemic chemotherapy initially with carboplatin and gemcitabine body carboplatin was discontinued secondary  to hypersensitivity reaction. She continued on treatment with single agent gemcitabine with reduced dose 800 MG/M2 on days 1 and 8 every 3 weeks status post 8 cycles. She is tolerating her treatment fairly well with no significant adverse effects. I recommended for her to continue with the same regimen of gemcitabine 800 MG/M2 on days 1 and 8 every 3 weeks as a scheduled. She will start day 8 of cycle #9 today. The patient would come back for follow-up visit in 2 weeks with the start of cycle #10. For the history of deep venous thrombosis she will continue on Lovenox subcutaneously on daily basis. I gave the patient a refill of oxycodone as well as Compazine today. She was advised to call immediately if she has any concerning symptoms in the interval.  All questions were answered. The patient knows to call the clinic with any problems, questions or concerns. We can certainly see the patient much sooner if necessary.   Disclaimer: This note was dictated with voice recognition software. Similar sounding words can inadvertently be transcribed and may not be corrected upon review.

## 2015-06-05 NOTE — Telephone Encounter (Signed)
Gave adn printed appt sched and avs for pt for Aug and Sept

## 2015-06-10 ENCOUNTER — Telehealth: Payer: Self-pay | Admitting: Internal Medicine

## 2015-06-10 MED ORDER — DOXYCYCLINE HYCLATE 100 MG PO TABS: 100.0000 mg | ORAL_TABLET | Freq: Two times a day (BID) | ORAL | Status: DC

## 2015-06-10 NOTE — Telephone Encounter (Signed)
Patient has cough, sore throat, unable to speak because of hoarseness in throat.  Patient finished round of prednisone and not any better.  No Fever.  Not able to cough out anything.  No tightness in chest. No SOB.  [facility]  Allergies  Allergen Reactions  . Carboplatin Rash    Skin test reaction  . Sulfonamide Derivatives Rash  . Codeine Other (See Comments)    hallucinations  . Dilaudid [Hydromorphone Hcl] Nausea And Vomiting and Other (See Comments)    Room started spinning.  Pt has been okay with low doses and nausea meds administered first  . Penicillins Other (See Comments)    Childhood allergy; reaction unknown  . Vicodin [Hydrocodone-Acetaminophen] Rash

## 2015-06-10 NOTE — Telephone Encounter (Signed)
She can try Take doxycycline '100mg'$  po twice daily x 5 days; take after meals and avoid sunlight    Allergies  Allergen Reactions  . Carboplatin Rash    Skin test reaction  . Sulfonamide Derivatives Rash  . Codeine Other (See Comments)    hallucinations  . Dilaudid [Hydromorphone Hcl] Nausea And Vomiting and Other (See Comments)    Room started spinning.  Pt has been okay with low doses and nausea meds administered first  . Penicillins Other (See Comments)    Childhood allergy; reaction unknown  . Vicodin [Hydrocodone-Acetaminophen] Rash

## 2015-06-10 NOTE — Telephone Encounter (Signed)
Rx sent to pharmacy. Patient notified. Nothing further needed.  

## 2015-06-12 ENCOUNTER — Other Ambulatory Visit (HOSPITAL_BASED_OUTPATIENT_CLINIC_OR_DEPARTMENT_OTHER): Payer: Medicare Other

## 2015-06-12 ENCOUNTER — Ambulatory Visit: Payer: Medicare Other | Admitting: Physician Assistant

## 2015-06-12 DIAGNOSIS — C3411 Malignant neoplasm of upper lobe, right bronchus or lung: Secondary | ICD-10-CM

## 2015-06-12 LAB — COMPREHENSIVE METABOLIC PANEL (CC13)
ALT: 26 U/L (ref 0–55)
AST: 25 U/L (ref 5–34)
Albumin: 3.1 g/dL — ABNORMAL LOW (ref 3.5–5.0)
Alkaline Phosphatase: 86 U/L (ref 40–150)
Anion Gap: 11 mEq/L (ref 3–11)
BUN: 11 mg/dL (ref 7.0–26.0)
CHLORIDE: 99 meq/L (ref 98–109)
CO2: 24 mEq/L (ref 22–29)
Calcium: 8.5 mg/dL (ref 8.4–10.4)
Creatinine: 0.8 mg/dL (ref 0.6–1.1)
EGFR: 75 mL/min/{1.73_m2} — ABNORMAL LOW (ref >90)
GLUCOSE: 413 mg/dL — AB (ref 70–140)
POTASSIUM: 4 meq/L (ref 3.5–5.1)
SODIUM: 134 meq/L — AB (ref 136–145)
Total Bilirubin: 0.48 mg/dL (ref 0.20–1.20)
Total Protein: 6.6 g/dL (ref 6.4–8.3)

## 2015-06-12 LAB — CBC WITH DIFFERENTIAL/PLATELET
BASO%: 0.2 % (ref 0.0–2.0)
Basophils Absolute: 0 10*3/uL (ref 0.0–0.1)
EOS ABS: 0 10*3/uL (ref 0.0–0.5)
EOS%: 0.4 % (ref 0.0–7.0)
HCT: 33.7 % — ABNORMAL LOW (ref 34.8–46.6)
HEMOGLOBIN: 10.7 g/dL — AB (ref 11.6–15.9)
LYMPH%: 15 % (ref 14.0–49.7)
MCH: 27 pg (ref 25.1–34.0)
MCHC: 31.8 g/dL (ref 31.5–36.0)
MCV: 85.1 fL (ref 79.5–101.0)
MONO#: 0.5 10*3/uL (ref 0.1–0.9)
MONO%: 10.3 % (ref 0.0–14.0)
NEUT%: 74.1 % (ref 38.4–76.8)
NEUTROS ABS: 3.7 10*3/uL (ref 1.5–6.5)
PLATELETS: 79 10*3/uL — AB (ref 145–400)
RBC: 3.96 10*6/uL (ref 3.70–5.45)
RDW: 17.2 % — AB (ref 11.2–14.5)
WBC: 5.1 10*3/uL (ref 3.9–10.3)
lymph#: 0.8 10*3/uL — ABNORMAL LOW (ref 0.9–3.3)

## 2015-06-17 ENCOUNTER — Other Ambulatory Visit: Payer: Self-pay | Admitting: Internal Medicine

## 2015-06-19 ENCOUNTER — Ambulatory Visit: Payer: Medicare Other

## 2015-06-19 ENCOUNTER — Ambulatory Visit: Payer: Medicare Other | Admitting: Physician Assistant

## 2015-06-19 ENCOUNTER — Other Ambulatory Visit: Payer: Medicare Other

## 2015-06-19 ENCOUNTER — Telehealth: Payer: Self-pay | Admitting: Internal Medicine

## 2015-06-19 NOTE — Telephone Encounter (Signed)
pt called to r/s appt...done....pt ok and aware of new d.t °

## 2015-06-24 ENCOUNTER — Encounter (HOSPITAL_COMMUNITY): Payer: Self-pay

## 2015-06-24 ENCOUNTER — Other Ambulatory Visit (HOSPITAL_COMMUNITY): Payer: Self-pay | Admitting: Otolaryngology

## 2015-06-24 ENCOUNTER — Ambulatory Visit (HOSPITAL_COMMUNITY)
Admission: RE | Admit: 2015-06-24 | Discharge: 2015-06-24 | Disposition: A | Discharge status: Home / Self Care | Payer: Medicare Other | Source: Ambulatory Visit | Attending: Otolaryngology | Admitting: Otolaryngology

## 2015-06-24 DIAGNOSIS — C3411 Malignant neoplasm of upper lobe, right bronchus or lung: Secondary | ICD-10-CM | POA: Insufficient documentation

## 2015-06-24 DIAGNOSIS — J3800 Paralysis of vocal cords and larynx, unspecified: Secondary | ICD-10-CM

## 2015-06-24 DIAGNOSIS — R0602 Shortness of breath: Secondary | ICD-10-CM | POA: Diagnosis not present

## 2015-06-24 MED ORDER — IOHEXOL 300 MG/ML  SOLN
75.0000 mL | Freq: Once | INTRAMUSCULAR | Status: AC | PRN
  Administered 2015-06-24: 75 mL via INTRAVENOUS

## 2015-06-25 ENCOUNTER — Other Ambulatory Visit (HOSPITAL_BASED_OUTPATIENT_CLINIC_OR_DEPARTMENT_OTHER): Payer: Medicare Other

## 2015-06-25 ENCOUNTER — Telehealth: Payer: Self-pay | Admitting: Internal Medicine

## 2015-06-25 ENCOUNTER — Encounter: Payer: Self-pay | Admitting: Internal Medicine

## 2015-06-25 ENCOUNTER — Ambulatory Visit (HOSPITAL_BASED_OUTPATIENT_CLINIC_OR_DEPARTMENT_OTHER): Payer: Medicare Other | Admitting: Internal Medicine

## 2015-06-25 VITALS — BP 122/63 | HR 120 | Temp 98.4°F | Resp 18 | Ht 67.0 in | Wt 159.9 lb

## 2015-06-25 DIAGNOSIS — C3411 Malignant neoplasm of upper lobe, right bronchus or lung: Secondary | ICD-10-CM

## 2015-06-25 DIAGNOSIS — Z7901 Long term (current) use of anticoagulants: Secondary | ICD-10-CM

## 2015-06-25 DIAGNOSIS — Z86718 Personal history of other venous thrombosis and embolism: Secondary | ICD-10-CM | POA: Diagnosis not present

## 2015-06-25 DIAGNOSIS — E119 Type 2 diabetes mellitus without complications: Secondary | ICD-10-CM

## 2015-06-25 DIAGNOSIS — E1140 Type 2 diabetes mellitus with diabetic neuropathy, unspecified: Secondary | ICD-10-CM

## 2015-06-25 LAB — CBC WITH DIFFERENTIAL/PLATELET
BASO%: 1.3 % (ref 0.0–2.0)
Basophils Absolute: 0.1 10e3/uL (ref 0.0–0.1)
EOS%: 1.3 % (ref 0.0–7.0)
Eosinophils Absolute: 0.1 10e3/uL (ref 0.0–0.5)
HCT: 35.5 % (ref 34.8–46.6)
HGB: 11.2 g/dL — ABNORMAL LOW (ref 11.6–15.9)
LYMPH%: 14.6 % (ref 14.0–49.7)
MCH: 26.5 pg (ref 25.1–34.0)
MCHC: 31.6 g/dL (ref 31.5–36.0)
MCV: 83.9 fL (ref 79.5–101.0)
MONO#: 0.5 10e3/uL (ref 0.1–0.9)
MONO%: 10.5 % (ref 0.0–14.0)
NEUT#: 3.5 10e3/uL (ref 1.5–6.5)
NEUT%: 72.3 % (ref 38.4–76.8)
Platelets: 305 10e3/uL (ref 145–400)
RBC: 4.23 10e6/uL (ref 3.70–5.45)
RDW: 18.3 % — ABNORMAL HIGH (ref 11.2–14.5)
WBC: 4.8 10e3/uL (ref 3.9–10.3)
lymph#: 0.7 10e3/uL — ABNORMAL LOW (ref 0.9–3.3)

## 2015-06-25 LAB — COMPREHENSIVE METABOLIC PANEL (CC13)
ALBUMIN: 3.1 g/dL — AB (ref 3.5–5.0)
ALK PHOS: 89 U/L (ref 40–150)
ALT: 13 U/L (ref 0–55)
AST: 20 U/L (ref 5–34)
Anion Gap: 10 mEq/L (ref 3–11)
BUN: 8 mg/dL (ref 7.0–26.0)
CALCIUM: 8.6 mg/dL (ref 8.4–10.4)
CO2: 25 mEq/L (ref 22–29)
CREATININE: 0.8 mg/dL (ref 0.6–1.1)
Chloride: 99 mEq/L (ref 98–109)
EGFR: 76 mL/min/{1.73_m2} — ABNORMAL LOW (ref >90)
GLUCOSE: 437 mg/dL — AB (ref 70–140)
POTASSIUM: 4.4 meq/L (ref 3.5–5.1)
SODIUM: 134 meq/L — AB (ref 136–145)
TOTAL PROTEIN: 6.8 g/dL (ref 6.4–8.3)
Total Bilirubin: 0.33 mg/dL (ref 0.20–1.20)

## 2015-06-25 MED ORDER — PROCHLORPERAZINE MALEATE 10 MG PO TABS: 10.0000 mg | ORAL_TABLET | Freq: Four times a day (QID) | ORAL | Status: AC | PRN

## 2015-06-25 MED ORDER — OXYCODONE HCL 5 MG PO TABS: 5.0000 mg | ORAL_TABLET | ORAL | Status: DC | PRN

## 2015-06-25 MED ORDER — INSULIN REGULAR HUMAN 100 UNIT/ML IJ SOLN
15.0000 [IU] | Freq: Once | INTRAMUSCULAR | Status: AC
  Administered 2015-06-25: 15 [IU] via SUBCUTANEOUS
  Filled 2015-06-25: qty 0.15

## 2015-06-25 NOTE — Progress Notes (Signed)
[place] [phone]   [phone]  OFFICE PROGRESS NOTE  [name], NP [place] [address]  DIAGNOSIS: Recurrent non-small cell lung cancer, squamous cell carcinoma initially diagnosis stage IIIA (T1 N2 MX ) in August of 2010.   PRIOR THERAPY:  #1 status post concurrent chemoradiation with weekly carboplatin and paclitaxel, last dose of chemotherapy given 08/05/2011.  #2 status post consolidation chemotherapy with carboplatin paclitaxel last dose given 10/27/2009.  #3 Concurrent chemoradiation with weekly carboplatin for an AUC of 2 and paclitaxel at 45 mg per meter squared given concurrent with radiotherapy, last dose was given 09/20/2011.  #4 Systemic chemotherapy with gemcitabine 1000 mg meter squared on days 1 and 8 every 3 weeks status post 3 cycles with stable disease, last dose was given 12/20/2011.  #5 Systemic chemotherapy again with single agent gemcitabine 1000 mg/M2 on days 1 and 8 every 3 weeks. First cycle 11/07/2013. Status post 4 cycles. #6  Systemic chemotherapy again with single agent gemcitabine 1000 mg/M2 on days 1 and 8 every 3 weeks. First dose 07/24/2014. Discontinued today secondary to intolerance. #7  Immunotherapy with Nivolumab 3 MG/KG every 2 weeks. First dose 09/18/2014. She is status post 2 cycles. Last dose was given 10/02/2014 discontinued secondary to disease progression with significant SVC syndrome.  CURRENT THERAPY: Systemic chemotherapy with gemcitabine 800 MG/M2 on days 1 and 8 every 3 weeks. First dose 11/07/2014. Status post 9 cycles.  INTERVAL HISTORY: Janice Ball 59 y.o. female returns to the clinic today for follow-up visit. The patient is feeling fine today with no specific complaints except for mild shortness of breath and hoarseness of her voice. She was seen recently by Dr. [name] for evaluation of her hoarseness and she was found to have vocal cord paralysis. He is  considering her for some intervention to correct this problem. She is tolerating her current treatment with single agent gemcitabine fairly well with no significant adverse effects. She denied having any significant chest pain but has mild cough with no hemoptysis. She denied having any significant weight loss or night sweats. She denied having any nausea or vomiting. She has no fever or chills. She had repeat CT scan of the chest performed recently and she is here for evaluation and discussion of her scan results.  MEDICAL HISTORY: Past Medical History  Diagnosis Date  . Anxiety   . Hyperlipidemia   . Hypothyroidism   . COPD (chronic obstructive pulmonary disease)   . Arthritis     thumbs  . 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 Lung cancer dx'd 05/2009  . Lung cancer dx'd 09/2013    recurrence in LN  . Diabetes mellitus     ALLERGIES:  is allergic to carboplatin; sulfonamide derivatives; codeine; dilaudid; penicillins; and vicodin.  MEDICATIONS:  Current Outpatient Prescriptions  Medication Sig Dispense Refill  . acetaminophen (TYLENOL) 325 MG tablet Take 650 mg by mouth every 6 (six) hours as needed for mild pain or headache.     . alendronate (FOSAMAX) 10 MG tablet Take 10 mg by mouth daily.     Marland Kitchen atorvastatin (LIPITOR) 20 MG tablet     . atorvastatin (LIPITOR) 80 MG tablet Take 1 tablet (80 mg total) by mouth daily at 6 PM. 30 tablet 0  . benzonatate (TESSALON) 200 MG capsule Take 1 capsule (200 mg total) by  mouth 3 (three) times daily as needed for cough. 30 capsule 1  . budesonide-formoterol (SYMBICORT) 160-4.5 MCG/ACT inhaler Inhale 2 puffs into the lungs 2 (two) times daily.    . Cetirizine HCl (ZYRTEC ALLERGY) 10 MG CAPS Take 1 capsule by mouth daily as needed.    . cholecalciferol (VITAMIN D) 1000 UNITS tablet Take 1,000 Units by mouth every morning.     . [employer] ASPIRIN ADULT  LOW DOSE 81 MG chewable tablet Chew 81 mg by mouth daily.  0  . cyanocobalamin 1000 MCG tablet Take 1,000 mcg by mouth every morning.     . enoxaparin (LOVENOX) 120 MG/0.8ML injection INJECT 0.8MLS (1 SYRINGE) INTO THE SKIN DAILY  1  . glimepiride (AMARYL) 2 MG tablet Take 2 tablets (4 mg total) by mouth daily with breakfast. Take higher dose (6mby mouth) while on prednisone. Once off prednisone go back to home regimen of 2 mg by mouth daily. 30 tablet 0  . guaiFENesin (MUCINEX) 600 MG 12 hr tablet Take by mouth 2 (two) times daily.    .Marland Kitchenipratropium (ATROVENT) 0.02 % nebulizer solution Inhale 1 mL into the lungs 4 (four) times daily as needed.  1  . levothyroxine (SYNTHROID, LEVOTHROID) 137 MCG tablet Take 137 mcg by mouth daily.     .Marland KitchenLORazepam (ATIVAN) 1 MG tablet Take 1 mg by mouth 3 (three) times daily as needed for anxiety.     . magnesium gluconate (MAGONATE) 500 MG tablet Take 500 mg by mouth every morning.     . metFORMIN (GLUCOPHAGE) 1000 MG tablet Take 1,000 mg by mouth 2 (two) times daily with a meal.    . Multiple Vitamin (MULTIVITAMIN WITH MINERALS) TABS tablet Take 1 tablet by mouth every morning.    .Marland KitchenoxyCODONE (OXY IR/ROXICODONE) 5 MG immediate release tablet Take 1 tablet (5 mg total) by mouth every 4 (four) hours as needed for severe pain. 60 tablet 0  . PROAIR HFA 108 (90 BASE) MCG/ACT inhaler INHALE 2 PUFFS INTO THE LUNGS EVERY 6 HOURS AS NEEDED FOR WHEEZING OR SHORTNESS OF BREATH. 1 Inhaler 2  . prochlorperazine (COMPAZINE) 10 MG tablet Take 10 mg by mouth every 6 (six) hours as needed for nausea or vomiting.    . Rivaroxaban (XARELTO STARTER PACK) 15 & 20 MG TBPK Take as directed on package: Start with one '15mg'$  tablet by mouth twice a day with food. On Day 22, switch to one '20mg'$  tablet once a day with food. 51 each 0  . sertraline (ZOLOFT) 100 MG tablet Take 150 mg by mouth at bedtime.     .Marland Kitchenspironolactone (ALDACTONE) 25 MG tablet Take 25 mg by mouth every morning.  4  .  SYMBICORT 160-4.5 MCG/ACT inhaler INHALE 2 PUFFS INTO THE LUNGS 2 (TWO) TIMES DAILY. 10.2 Inhaler 5  . tiotropium (SPIRIVA) 18 MCG inhalation capsule Place 1 capsule (18 mcg total) into inhaler and inhale daily. 30 capsule 12   No current facility-administered medications for this visit.   Facility-Administered Medications Ordered in Other Visits  Medication Dose Route Frequency Provider Last Rate Last Dose  . sodium chloride 0.9 % injection 10 mL  10 mL Intracatheter PRN MCurt Bears MD   10 mL at 11/07/14 1558    SURGICAL HISTORY:  Past Surgical History  Procedure Laterality Date  . Thyroidectomy, partial    . Tonsillectomy    . Tubal ligation    . Partial hysterectomy      REVIEW OF SYSTEMS:  Constitutional: positive  for fatigue Eyes: negative Ears, nose, mouth, throat, and face: positive for hoarseness Respiratory: positive for cough and dyspnea on exertion Cardiovascular: negative Gastrointestinal: negative Genitourinary:negative Integument/breast: negative Hematologic/lymphatic: negative Musculoskeletal:negative Neurological: negative Behavioral/Psych: negative Endocrine: negative Allergic/Immunologic: negative   PHYSICAL EXAMINATION: General appearance: alert, cooperative, no distress and Swelling of her face and puffiness of her eye Head: Normocephalic, without obvious abnormality, atraumatic Neck: no adenopathy, no JVD, supple, symmetrical, trachea midline and thyroid not enlarged, symmetric, no tenderness/mass/nodules Lymph nodes: Cervical, supraclavicular, and axillary nodes normal. Resp: wheezes bilaterally Back: symmetric, no curvature. ROM normal. No CVA tenderness. Cardio: regular rate and rhythm, S1, S2 normal, no murmur, click, rub or gallop GI: soft, non-tender; bowel sounds normal; no masses,  no organomegaly Extremities: extremities normal, atraumatic, no cyanosis or edema Neurologic: Alert and oriented X 3, normal strength and tone. Normal symmetric  reflexes. Normal coordination and gait  ECOG PERFORMANCE STATUS: 1 - Symptomatic but completely ambulatory  Blood pressure 122/63, pulse 120, temperature 98.4 F (36.9 C), temperature source Oral, resp. rate 18, height '5\' 7"'$  (1.702 m), weight 159 lb 14.4 oz (72.53 kg), SpO2 98 %.  LABORATORY DATA: Lab Results  Component Value Date   WBC 4.8 06/25/2015   HGB 11.2* 06/25/2015   HCT 35.5 06/25/2015   MCV 83.9 06/25/2015   PLT 305 06/25/2015      Chemistry      Component Value Date/Time   NA 134* 06/25/2015 1025   NA 135 04/26/2015 0430   NA 141 04/04/2012 0945   K 4.4 06/25/2015 1025   K 4.3 04/26/2015 0430   K 4.7 04/04/2012 0945   CL 96* 04/26/2015 0430   CL 101 04/06/2013 1041   CL 100 04/04/2012 0945   CO2 25 06/25/2015 1025   CO2 28 04/26/2015 0430   CO2 29 04/04/2012 0945   BUN 8.0 06/25/2015 1025   BUN 25* 04/26/2015 0430   BUN 16 04/04/2012 0945   CREATININE 0.8 06/25/2015 1025   CREATININE 0.62 04/26/2015 0430   CREATININE 0.9 04/04/2012 0945      Component Value Date/Time   CALCIUM 8.6 06/25/2015 1025   CALCIUM 8.1* 04/26/2015 0430   CALCIUM 8.4 04/04/2012 0945   ALKPHOS 89 06/25/2015 1025   ALKPHOS 74 04/20/2015 0340   ALKPHOS 61 04/04/2012 0945   AST 20 06/25/2015 1025   AST 22 04/20/2015 0340   AST 31 04/04/2012 0945   ALT 13 06/25/2015 1025   ALT 14 04/20/2015 0340   ALT 30 04/04/2012 0945   BILITOT 0.33 06/25/2015 1025   BILITOT 0.6 04/20/2015 0340   BILITOT 0.50 04/04/2012 0945       RADIOGRAPHIC STUDIES: Ct Chest W Contrast  06/24/2015   CLINICAL DATA:  Current history of lung cancer. Shortness of breath. Vocal cord paralysis.  EXAM: CT CHEST WITH CONTRAST  TECHNIQUE: Multidetector CT imaging of the chest was performed during intravenous contrast administration.  CONTRAST:  39m OMNIPAQUE IOHEXOL 300 MG/ML  SOLN  COMPARISON:  CT scan of April 20, 2015 and Feb 17, 2015.  FINDINGS: No pneumothorax or significant pleural effusion is noted. No  significant osseous abnormality is noted. Left lung is clear. Stable soft tissue density is noted E in the superior mediastinum and right suprahilar region compared to prior exam, most consistent with radiation fibrosis. Stable subcarinal adenopathy measuring 19 x 13 mm is noted. Stable small right adrenal nodule is noted. Coronary artery calcifications are noted. Atherosclerosis of thoracic aorta is noted without aneurysm or dissection. 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 Right internal jugular Port-A-Cath is noted with distal tip in the right atrium. No new mediastinal mass or adenopathy is noted.  IMPRESSION: Stable superior mediastinal and right suprahilar soft tissue density is noted most consistent with radiation fibrosis.  Stable small right adrenal nodule is noted.  Continued presence of stent in superior vena cava which appears to be patent.  Coronary artery calcifications are noted suggesting coronary artery disease.   Electronically Signed   By: Marijo Conception, M.D.   On: 06/24/2015 15:53   ASSESSMENT AND PLAN: This is a very pleasant 59 years old white female with recurrent non-small cell lung cancer status post concurrent chemoradiation as well as consolidation chemotherapy and she is currently on systemic chemotherapy with single agent gemcitabine status post 4 cycles.  She was treated again with a course of systemic chemotherapy with single agent gemcitabine for 3 cycles but the patient has rough time tolerating her treatment with significant fatigue and weakness. The recent CT scan of the chest, abdomen and pelvis showed stable disease but the patient continues to have the persistent right upper lobe area of mass fibrosis and consolidation as well as persistent right hilar, subcarinal and right paratracheal lymphadenopathy as well as, one area of masslike soft tissue immediately anterior to the carina. She was started on treatment with Nivolumab status  post 2 cycles. She tolerated the treatment well but she presented with evidence for disease progression in the chest. This treatment was discontinued. 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 Further imaging studies showed evidence for disease progression. The patient was restarted on systemic chemotherapy initially with carboplatin and gemcitabine body carboplatin was discontinued secondary to hypersensitivity reaction. She continued on treatment with single agent gemcitabine with reduced dose 800 MG/M2 on days 1 and 8 every 3 weeks status post 9 cycles. She is tolerating her treatment fairly well with no significant adverse effects. The recent CT scan of the chest performed on 06/24/2015 showed no evidence for disease progression. I discussed the scan results with the patient today. I recommended for her to continue with the same regimen of gemcitabine 800 MG/M2 on days 1 and 8 every 3 weeks as a scheduled. She will start day 8 of cycle #10 tomorrow. The patient would come back for follow-up visit in 3 weeks with the start of cycle #11. For the history of deep venous thrombosis she will continue on Lovenox subcutaneously on daily basis. For pain management, the patient was given a refill of oxycodone. She also has refill of Compazine for nausea. She was advised to call immediately if she has any concerning symptoms in the interval.  All questions were answered. The patient knows to call the clinic with any problems, questions or concerns. We can certainly see the patient much sooner if necessary.   Disclaimer: This note was dictated with voice recognition software. Similar sounding words can inadvertently be transcribed and may not be corrected upon review.

## 2015-06-25 NOTE — Telephone Encounter (Signed)
Appointments made and avs printed for patient °

## 2015-06-26 ENCOUNTER — Other Ambulatory Visit: Payer: Medicare Other

## 2015-06-26 ENCOUNTER — Ambulatory Visit (HOSPITAL_BASED_OUTPATIENT_CLINIC_OR_DEPARTMENT_OTHER): Payer: Medicare Other

## 2015-06-26 VITALS — BP 123/65 | HR 109 | Temp 98.1°F | Resp 18

## 2015-06-26 DIAGNOSIS — C3411 Malignant neoplasm of upper lobe, right bronchus or lung: Secondary | ICD-10-CM

## 2015-06-26 DIAGNOSIS — Z5111 Encounter for antineoplastic chemotherapy: Secondary | ICD-10-CM | POA: Diagnosis not present

## 2015-06-26 MED ORDER — SODIUM CHLORIDE 0.9 % IV SOLN
800.0000 mg/m2 | Freq: Once | INTRAVENOUS | Status: AC
  Administered 2015-06-26: 1558 mg via INTRAVENOUS
  Filled 2015-06-26: qty 40.98

## 2015-06-26 MED ORDER — SODIUM CHLORIDE 0.9 % IV SOLN
Freq: Once | INTRAVENOUS | Status: AC
  Administered 2015-06-26: 14:00:00 via INTRAVENOUS

## 2015-06-26 MED ORDER — HEPARIN SOD (PORK) LOCK FLUSH 100 UNIT/ML IV SOLN
500.0000 [IU] | Freq: Once | INTRAVENOUS | Status: AC | PRN
  Administered 2015-06-26: 500 [IU]
  Filled 2015-06-26: qty 5

## 2015-06-26 MED ORDER — PROCHLORPERAZINE EDISYLATE 5 MG/ML IJ SOLN
INTRAMUSCULAR | Status: AC
  Filled 2015-06-26: qty 2

## 2015-06-26 MED ORDER — PROCHLORPERAZINE EDISYLATE 5 MG/ML IJ SOLN
10.0000 mg | Freq: Once | INTRAMUSCULAR | Status: AC
  Administered 2015-06-26: 10 mg via INTRAVENOUS

## 2015-06-26 MED ORDER — SODIUM CHLORIDE 0.9 % IJ SOLN
10.0000 mL | INTRAMUSCULAR | Status: DC | PRN
  Administered 2015-06-26: 10 mL
  Filled 2015-06-26: qty 10

## 2015-07-03 ENCOUNTER — Other Ambulatory Visit (HOSPITAL_BASED_OUTPATIENT_CLINIC_OR_DEPARTMENT_OTHER): Payer: Medicare Other

## 2015-07-03 DIAGNOSIS — C3411 Malignant neoplasm of upper lobe, right bronchus or lung: Secondary | ICD-10-CM

## 2015-07-03 DIAGNOSIS — C3481 Malignant neoplasm of overlapping sites of right bronchus and lung: Secondary | ICD-10-CM

## 2015-07-03 LAB — CBC WITH DIFFERENTIAL/PLATELET
BASO%: 0.5 % (ref 0.0–2.0)
Basophils Absolute: 0 10*3/uL (ref 0.0–0.1)
EOS%: 0.2 % (ref 0.0–7.0)
Eosinophils Absolute: 0 10*3/uL (ref 0.0–0.5)
HCT: 33.6 % — ABNORMAL LOW (ref 34.8–46.6)
HEMOGLOBIN: 10.7 g/dL — AB (ref 11.6–15.9)
LYMPH#: 0.8 10*3/uL — AB (ref 0.9–3.3)
LYMPH%: 19.7 % (ref 14.0–49.7)
MCH: 26.5 pg (ref 25.1–34.0)
MCHC: 31.8 g/dL (ref 31.5–36.0)
MCV: 83.2 fL (ref 79.5–101.0)
MONO#: 0.4 10*3/uL (ref 0.1–0.9)
MONO%: 10 % (ref 0.0–14.0)
NEUT#: 2.9 10*3/uL (ref 1.5–6.5)
NEUT%: 69.6 % (ref 38.4–76.8)
Platelets: 116 10*3/uL — ABNORMAL LOW (ref 145–400)
RBC: 4.04 10*6/uL (ref 3.70–5.45)
RDW: 16.3 % — ABNORMAL HIGH (ref 11.2–14.5)
WBC: 4.1 10*3/uL (ref 3.9–10.3)

## 2015-07-03 LAB — COMPREHENSIVE METABOLIC PANEL (CC13)
ALK PHOS: 91 U/L (ref 40–150)
ALT: 19 U/L (ref 0–55)
AST: 24 U/L (ref 5–34)
Albumin: 3.1 g/dL — ABNORMAL LOW (ref 3.5–5.0)
Anion Gap: 8 mEq/L (ref 3–11)
BUN: 7.5 mg/dL (ref 7.0–26.0)
CHLORIDE: 105 meq/L (ref 98–109)
CO2: 25 meq/L (ref 22–29)
Calcium: 8.9 mg/dL (ref 8.4–10.4)
Creatinine: 0.7 mg/dL (ref 0.6–1.1)
GLUCOSE: 175 mg/dL — AB (ref 70–140)
POTASSIUM: 4.2 meq/L (ref 3.5–5.1)
SODIUM: 139 meq/L (ref 136–145)
Total Bilirubin: 0.32 mg/dL (ref 0.20–1.20)
Total Protein: 7.1 g/dL (ref 6.4–8.3)

## 2015-07-07 ENCOUNTER — Other Ambulatory Visit: Payer: Self-pay | Admitting: Internal Medicine

## 2015-07-07 DIAGNOSIS — C3411 Malignant neoplasm of upper lobe, right bronchus or lung: Secondary | ICD-10-CM

## 2015-07-07 DIAGNOSIS — I871 Compression of vein: Secondary | ICD-10-CM

## 2015-07-07 DIAGNOSIS — I82409 Acute embolism and thrombosis of unspecified deep veins of unspecified lower extremity: Secondary | ICD-10-CM | POA: Insufficient documentation

## 2015-07-07 MED ORDER — RIVAROXABAN 20 MG PO TABS: 20.0000 mg | ORAL_TABLET | Freq: Every day | ORAL | Status: AC

## 2015-07-10 ENCOUNTER — Ambulatory Visit (HOSPITAL_BASED_OUTPATIENT_CLINIC_OR_DEPARTMENT_OTHER): Payer: Medicare Other

## 2015-07-10 ENCOUNTER — Other Ambulatory Visit (HOSPITAL_BASED_OUTPATIENT_CLINIC_OR_DEPARTMENT_OTHER): Payer: Medicare Other

## 2015-07-10 VITALS — BP 124/72 | HR 118 | Temp 98.2°F | Resp 20

## 2015-07-10 DIAGNOSIS — C3411 Malignant neoplasm of upper lobe, right bronchus or lung: Secondary | ICD-10-CM

## 2015-07-10 DIAGNOSIS — Z5111 Encounter for antineoplastic chemotherapy: Secondary | ICD-10-CM | POA: Diagnosis not present

## 2015-07-10 LAB — CBC WITH DIFFERENTIAL/PLATELET
BASO%: 0.6 % (ref 0.0–2.0)
BASOS ABS: 0 10*3/uL (ref 0.0–0.1)
EOS%: 1.3 % (ref 0.0–7.0)
Eosinophils Absolute: 0.1 10*3/uL (ref 0.0–0.5)
HEMATOCRIT: 33.5 % — AB (ref 34.8–46.6)
HGB: 10.7 g/dL — ABNORMAL LOW (ref 11.6–15.9)
LYMPH%: 8.4 % — ABNORMAL LOW (ref 14.0–49.7)
MCH: 26.4 pg (ref 25.1–34.0)
MCHC: 31.8 g/dL (ref 31.5–36.0)
MCV: 82.9 fL (ref 79.5–101.0)
MONO#: 0.6 10*3/uL (ref 0.1–0.9)
MONO%: 6.6 % (ref 0.0–14.0)
NEUT#: 6.9 10*3/uL — ABNORMAL HIGH (ref 1.5–6.5)
NEUT%: 83.1 % — AB (ref 38.4–76.8)
Platelets: 265 10*3/uL (ref 145–400)
RBC: 4.04 10*6/uL (ref 3.70–5.45)
RDW: 18.8 % — ABNORMAL HIGH (ref 11.2–14.5)
WBC: 8.3 10*3/uL (ref 3.9–10.3)
lymph#: 0.7 10*3/uL — ABNORMAL LOW (ref 0.9–3.3)

## 2015-07-10 LAB — COMPREHENSIVE METABOLIC PANEL (CC13)
ALT: 11 U/L (ref 0–55)
AST: 14 U/L (ref 5–34)
Albumin: 3.1 g/dL — ABNORMAL LOW (ref 3.5–5.0)
Alkaline Phosphatase: 97 U/L (ref 40–150)
Anion Gap: 9 mEq/L (ref 3–11)
BUN: 7.3 mg/dL (ref 7.0–26.0)
CALCIUM: 7.9 mg/dL — AB (ref 8.4–10.4)
CHLORIDE: 103 meq/L (ref 98–109)
CO2: 25 mEq/L (ref 22–29)
Creatinine: 0.7 mg/dL (ref 0.6–1.1)
Glucose: 216 mg/dl — ABNORMAL HIGH (ref 70–140)
POTASSIUM: 4 meq/L (ref 3.5–5.1)
SODIUM: 137 meq/L (ref 136–145)
Total Bilirubin: 0.34 mg/dL (ref 0.20–1.20)
Total Protein: 6.7 g/dL (ref 6.4–8.3)

## 2015-07-10 MED ORDER — HEPARIN SOD (PORK) LOCK FLUSH 100 UNIT/ML IV SOLN
500.0000 [IU] | Freq: Once | INTRAVENOUS | Status: AC | PRN
  Administered 2015-07-10: 500 [IU]
  Filled 2015-07-10: qty 5

## 2015-07-10 MED ORDER — PROCHLORPERAZINE MALEATE 10 MG PO TABS
10.0000 mg | ORAL_TABLET | Freq: Once | ORAL | Status: AC
  Administered 2015-07-10: 10 mg via ORAL

## 2015-07-10 MED ORDER — SODIUM CHLORIDE 0.9 % IJ SOLN
10.0000 mL | INTRAMUSCULAR | Status: DC | PRN
  Administered 2015-07-10: 10 mL
  Filled 2015-07-10: qty 10

## 2015-07-10 MED ORDER — PROCHLORPERAZINE MALEATE 10 MG PO TABS
ORAL_TABLET | ORAL | Status: AC
  Filled 2015-07-10: qty 1

## 2015-07-10 MED ORDER — SODIUM CHLORIDE 0.9 % IV SOLN
800.0000 mg/m2 | Freq: Once | INTRAVENOUS | Status: AC
  Administered 2015-07-10: 1558 mg via INTRAVENOUS
  Filled 2015-07-10: qty 40.98

## 2015-07-10 MED ORDER — SODIUM CHLORIDE 0.9 % IV SOLN
Freq: Once | INTRAVENOUS | Status: AC
  Administered 2015-07-10: 14:00:00 via INTRAVENOUS

## 2015-07-10 NOTE — Patient Instructions (Signed)
[facility] Discharge Instructions for Patients Receiving Chemotherapy  Today you received the following chemotherapy agents:  Gemzar  To help prevent nausea and vomiting after your treatment, we encourage you to take your nausea medication as ordered per MD.   If you develop nausea and vomiting that is not controlled by your nausea medication, call the clinic.   BELOW ARE SYMPTOMS THAT SHOULD BE REPORTED IMMEDIATELY:  *FEVER GREATER THAN 100.5 F  *CHILLS WITH OR WITHOUT FEVER  NAUSEA AND VOMITING THAT IS NOT CONTROLLED WITH YOUR NAUSEA MEDICATION  *UNUSUAL SHORTNESS OF BREATH  *UNUSUAL BRUISING OR BLEEDING  TENDERNESS IN MOUTH AND THROAT WITH OR WITHOUT PRESENCE OF ULCERS  *URINARY PROBLEMS  *BOWEL PROBLEMS  UNUSUAL RASH Items with * indicate a potential emergency and should be followed up as soon as possible.  Feel free to call the clinic you have any questions or concerns. The clinic phone number is [phone].  Please show the CHEMO ALERT CARD at check-in to the Emergency Department and triage nurse.   

## 2015-07-17 ENCOUNTER — Other Ambulatory Visit: Payer: Self-pay | Admitting: Otolaryngology

## 2015-07-17 ENCOUNTER — Encounter (HOSPITAL_BASED_OUTPATIENT_CLINIC_OR_DEPARTMENT_OTHER): Payer: Self-pay | Admitting: *Deleted

## 2015-07-17 ENCOUNTER — Other Ambulatory Visit: Payer: Medicare Other

## 2015-07-17 ENCOUNTER — Other Ambulatory Visit (HOSPITAL_BASED_OUTPATIENT_CLINIC_OR_DEPARTMENT_OTHER): Payer: Medicare Other

## 2015-07-17 ENCOUNTER — Ambulatory Visit: Payer: Medicare Other

## 2015-07-17 ENCOUNTER — Telehealth: Payer: Self-pay | Admitting: Internal Medicine

## 2015-07-17 ENCOUNTER — Ambulatory Visit (HOSPITAL_BASED_OUTPATIENT_CLINIC_OR_DEPARTMENT_OTHER): Payer: Medicare Other | Admitting: Internal Medicine

## 2015-07-17 ENCOUNTER — Encounter: Payer: Self-pay | Admitting: Internal Medicine

## 2015-07-17 VITALS — BP 143/72 | HR 125 | Temp 98.0°F | Resp 17 | Ht 67.0 in | Wt 159.9 lb

## 2015-07-17 DIAGNOSIS — D709 Neutropenia, unspecified: Secondary | ICD-10-CM

## 2015-07-17 DIAGNOSIS — Z86718 Personal history of other venous thrombosis and embolism: Secondary | ICD-10-CM | POA: Diagnosis not present

## 2015-07-17 DIAGNOSIS — C3411 Malignant neoplasm of upper lobe, right bronchus or lung: Secondary | ICD-10-CM

## 2015-07-17 DIAGNOSIS — Z7901 Long term (current) use of anticoagulants: Secondary | ICD-10-CM | POA: Diagnosis not present

## 2015-07-17 LAB — CBC WITH DIFFERENTIAL/PLATELET
BASO%: 1.3 % (ref 0.0–2.0)
BASOS ABS: 0 10*3/uL (ref 0.0–0.1)
EOS ABS: 0 10*3/uL (ref 0.0–0.5)
EOS%: 1.3 % (ref 0.0–7.0)
HEMATOCRIT: 33.1 % — AB (ref 34.8–46.6)
HGB: 10.4 g/dL — ABNORMAL LOW (ref 11.6–15.9)
LYMPH#: 0.9 10*3/uL (ref 0.9–3.3)
LYMPH%: 36.2 % (ref 14.0–49.7)
MCH: 26.4 pg (ref 25.1–34.0)
MCHC: 31.4 g/dL — AB (ref 31.5–36.0)
MCV: 84 fL (ref 79.5–101.0)
MONO#: 0.4 10*3/uL (ref 0.1–0.9)
MONO%: 15.3 % — ABNORMAL HIGH (ref 0.0–14.0)
NEUT#: 1.1 10*3/uL — ABNORMAL LOW (ref 1.5–6.5)
NEUT%: 45.9 % (ref 38.4–76.8)
PLATELETS: 151 10*3/uL (ref 145–400)
RBC: 3.94 10*6/uL (ref 3.70–5.45)
RDW: 16.9 % — ABNORMAL HIGH (ref 11.2–14.5)
WBC: 2.4 10*3/uL — ABNORMAL LOW (ref 3.9–10.3)

## 2015-07-17 LAB — COMPREHENSIVE METABOLIC PANEL (CC13)
ALBUMIN: 3 g/dL — AB (ref 3.5–5.0)
ALK PHOS: 87 U/L (ref 40–150)
ALT: 18 U/L (ref 0–55)
ANION GAP: 9 meq/L (ref 3–11)
AST: 19 U/L (ref 5–34)
BUN: 9.4 mg/dL (ref 7.0–26.0)
CALCIUM: 9.2 mg/dL (ref 8.4–10.4)
CHLORIDE: 105 meq/L (ref 98–109)
CO2: 26 mEq/L (ref 22–29)
Creatinine: 0.7 mg/dL (ref 0.6–1.1)
Glucose: 158 mg/dl — ABNORMAL HIGH (ref 70–140)
POTASSIUM: 4.1 meq/L (ref 3.5–5.1)
Sodium: 140 mEq/L (ref 136–145)
Total Bilirubin: <0.3 mg/dL (ref 0.20–1.20)
Total Protein: 6.6 g/dL (ref 6.4–8.3)

## 2015-07-17 NOTE — Progress Notes (Signed)
[facility] [phone]) [phone]   [phone]) [phone]  OFFICE PROGRESS NOTE  Janice Chimes, NP [place] [address]  DIAGNOSIS: Recurrent non-small cell lung cancer, squamous cell carcinoma initially diagnosis stage IIIA (T1 N2 MX ) in August of 2010.   PRIOR THERAPY:  #1 status post concurrent chemoradiation with weekly carboplatin and paclitaxel, last dose of chemotherapy given 08/05/2011.  #2 status post consolidation chemotherapy with carboplatin paclitaxel last dose given 10/27/2009.  #3 Concurrent chemoradiation with weekly carboplatin for an AUC of 2 and paclitaxel at 45 mg per meter squared given concurrent with radiotherapy, last dose was given 09/20/2011.  #4 Systemic chemotherapy with gemcitabine 1000 mg meter squared on days 1 and 8 every 3 weeks status post 3 cycles with stable disease, last dose was given 12/20/2011.  #5 Systemic chemotherapy again with single agent gemcitabine 1000 mg/M2 on days 1 and 8 every 3 weeks. First cycle 11/07/2013. Status post 4 cycles. #6  Systemic chemotherapy again with single agent gemcitabine 1000 mg/M2 on days 1 and 8 every 3 weeks. First dose 07/24/2014. Discontinued today secondary to intolerance. #7  Immunotherapy with Nivolumab 3 MG/KG every 2 weeks. First dose 09/18/2014. She is status post 2 cycles. Last dose was given 10/02/2014 discontinued secondary to disease progression with significant SVC syndrome.  CURRENT THERAPY: Systemic chemotherapy with gemcitabine 800 MG/M2 on days 1 and 8 every 3 weeks. First dose 11/07/2014. Status post 9 cycles and day 1 of cycle #10.  INTERVAL HISTORY: Janice Ball 59 y.o. female returns to the clinic today for follow-up visit. The patient is feeling fine today with no specific complaints except for mild shortness of breath and hoarseness of her voice. She was seen recently by Dr. 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 She is scheduled for focal cord implant next week. She has been tolerating her current treatment with single agent gemcitabine fairly well with no significant adverse effects. She denied having any significant chest pain but has mild cough with no hemoptysis. She denied having any significant weight loss or night sweats. She denied having any nausea or vomiting. She has no fever or chills. She was here today to start day 8 of cycle #10.  MEDICAL HISTORY: Past Medical History  Diagnosis Date  . Anxiety   . Hyperlipidemia   . Hypothyroidism   . COPD (chronic obstructive pulmonary disease)   . Arthritis     thumbs  . 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 Lung cancer dx'd 05/2009  . Lung cancer dx'd 09/2013    recurrence in LN  . Diabetes mellitus     ALLERGIES:  is allergic to carboplatin; sulfonamide derivatives; codeine; dilaudid; penicillins; and vicodin.  MEDICATIONS:  Current Outpatient Prescriptions  Medication Sig Dispense Refill  . acetaminophen (TYLENOL) 325 MG tablet Take 650 mg by mouth every 6 (six) hours as needed for mild pain or headache.     . alendronate (FOSAMAX) 10 MG tablet Take 10 mg by mouth daily.     Marland Kitchen atorvastatin (LIPITOR) 20 MG tablet     . atorvastatin (LIPITOR) 80 MG tablet Take 1 tablet (80 mg total) by mouth daily at 6 PM. 30 tablet 0  . benzonatate (TESSALON) 200 MG capsule Take 1 capsule (200 mg total) by mouth 3 (three) times daily as  needed for cough. 30 capsule 1  . budesonide-formoterol (SYMBICORT) 160-4.5 MCG/ACT inhaler Inhale 2 puffs into the lungs 2 (two) times daily.    . Cetirizine HCl (ZYRTEC ALLERGY) 10 MG CAPS Take 1 capsule by mouth daily as needed.    . cholecalciferol (VITAMIN D) 1000 UNITS tablet Take 1,000 Units by mouth every morning.     . [facility] ASPIRIN ADULT LOW DOSE 81 MG chewable tablet Chew 81 mg by mouth daily.  0    . cyanocobalamin 1000 MCG tablet Take 1,000 mcg by mouth every morning.     . enoxaparin (LOVENOX) 120 MG/0.8ML injection INJECT 0.8MLS (1 SYRINGE) INTO THE SKIN DAILY  1  . glimepiride (AMARYL) 2 MG tablet Take 2 tablets (4 mg total) by mouth daily with breakfast. Take higher dose (39mby mouth) while on prednisone. Once off prednisone go back to home regimen of 2 mg by mouth daily. 30 tablet 0  . guaiFENesin (MUCINEX) 600 MG 12 hr tablet Take by mouth 2 (two) times daily.    .Marland Kitchenipratropium (ATROVENT) 0.02 % nebulizer solution Inhale 1 mL into the lungs 4 (four) times daily as needed.  1  . levothyroxine (SYNTHROID, LEVOTHROID) 137 MCG tablet Take 137 mcg by mouth daily.     .Marland KitchenLORazepam (ATIVAN) 1 MG tablet Take 1 mg by mouth 3 (three) times daily as needed for anxiety.     . magnesium gluconate (MAGONATE) 500 MG tablet Take 500 mg by mouth every morning.     . metFORMIN (GLUCOPHAGE) 1000 MG tablet Take 1,000 mg by mouth 2 (two) times daily with a meal.    . Multiple Vitamin (MULTIVITAMIN WITH MINERALS) TABS tablet Take 1 tablet by mouth every morning.    .Marland KitchenoxyCODONE (OXY IR/ROXICODONE) 5 MG immediate release tablet Take 1 tablet (5 mg total) by mouth every 4 (four) hours as needed for severe pain. 60 tablet 0  . PROAIR HFA 108 (90 BASE) MCG/ACT inhaler INHALE 2 PUFFS INTO THE LUNGS EVERY 6 HOURS AS NEEDED FOR WHEEZING OR SHORTNESS OF BREATH. 1 Inhaler 2  . prochlorperazine (COMPAZINE) 10 MG tablet Take 1 tablet (10 mg total) by mouth every 6 (six) hours as needed for nausea or vomiting. 30 tablet 1  . rivaroxaban (XARELTO) 20 MG TABS tablet Take 1 tablet (20 mg total) by mouth daily with supper. 30 tablet 3  . sertraline (ZOLOFT) 100 MG tablet Take 150 mg by mouth at bedtime.     .Marland Kitchenspironolactone (ALDACTONE) 25 MG tablet Take 25 mg by mouth every morning.  4  . SYMBICORT 160-4.5 MCG/ACT inhaler INHALE 2 PUFFS INTO THE LUNGS 2 (TWO) TIMES DAILY. 10.2 Inhaler 5  . tiotropium (SPIRIVA) 18 MCG  inhalation capsule Place 1 capsule (18 mcg total) into inhaler and inhale daily. 30 capsule 12   No current facility-administered medications for this visit.   Facility-Administered Medications Ordered in Other Visits  Medication Dose Route Frequency Provider Last Rate Last Dose  . sodium chloride 0.9 % injection 10 mL  10 mL Intracatheter PRN MCurt Bears MD   10 mL at 11/07/14 1558    SURGICAL HISTORY:  Past Surgical History  Procedure Laterality Date  . Thyroidectomy, partial    . Tonsillectomy    . Tubal ligation    . Partial hysterectomy      REVIEW OF SYSTEMS:  Constitutional: positive for fatigue Eyes: negative Ears, nose, mouth, throat, and face: positive for hoarseness Respiratory: positive for cough and dyspnea on exertion Cardiovascular: negative  Gastrointestinal: negative Genitourinary:negative Integument/breast: negative Hematologic/lymphatic: negative Musculoskeletal:negative Neurological: negative Behavioral/Psych: negative Endocrine: negative Allergic/Immunologic: negative   PHYSICAL EXAMINATION: General appearance: alert, cooperative, no distress and Swelling of her face and puffiness of her eye Head: Normocephalic, without obvious abnormality, atraumatic Neck: no adenopathy, no JVD, supple, symmetrical, trachea midline and thyroid not enlarged, symmetric, no tenderness/mass/nodules Lymph nodes: Cervical, supraclavicular, and axillary nodes normal. Resp: wheezes bilaterally Back: symmetric, no curvature. ROM normal. No CVA tenderness. Cardio: regular rate and rhythm, S1, S2 normal, no murmur, click, rub or gallop GI: soft, non-tender; bowel sounds normal; no masses,  no organomegaly Extremities: extremities normal, atraumatic, no cyanosis or edema Neurologic: Alert and oriented X 3, normal strength and tone. Normal symmetric reflexes. Normal coordination and gait  ECOG PERFORMANCE STATUS: 1 - Symptomatic but completely ambulatory  Blood pressure  143/72, pulse 125, temperature 98 F (36.7 C), temperature source Oral, resp. rate 17, height '5\' 7"'$  (1.702 m), weight 159 lb 14.4 oz (72.53 kg), SpO2 97 %.  LABORATORY DATA: Lab Results  Component Value Date   WBC 2.4* 07/17/2015   HGB 10.4* 07/17/2015   HCT 33.1* 07/17/2015   MCV 84.0 07/17/2015   PLT 151 07/17/2015      Chemistry      Component Value Date/Time   NA 137 07/10/2015 1316   NA 135 04/26/2015 0430   NA 141 04/04/2012 0945   K 4.0 07/10/2015 1316   K 4.3 04/26/2015 0430   K 4.7 04/04/2012 0945   CL 96* 04/26/2015 0430   CL 101 04/06/2013 1041   CL 100 04/04/2012 0945   CO2 25 07/10/2015 1316   CO2 28 04/26/2015 0430   CO2 29 04/04/2012 0945   BUN 7.3 07/10/2015 1316   BUN 25* 04/26/2015 0430   BUN 16 04/04/2012 0945   CREATININE 0.7 07/10/2015 1316   CREATININE 0.62 04/26/2015 0430   CREATININE 0.9 04/04/2012 0945      Component Value Date/Time   CALCIUM 7.9* 07/10/2015 1316   CALCIUM 8.1* 04/26/2015 0430   CALCIUM 8.4 04/04/2012 0945   ALKPHOS 97 07/10/2015 1316   ALKPHOS 74 04/20/2015 0340   ALKPHOS 61 04/04/2012 0945   AST 14 07/10/2015 1316   AST 22 04/20/2015 0340   AST 31 04/04/2012 0945   ALT 11 07/10/2015 1316   ALT 14 04/20/2015 0340   ALT 30 04/04/2012 0945   BILITOT 0.34 07/10/2015 1316   BILITOT 0.6 04/20/2015 0340   BILITOT 0.50 04/04/2012 0945       RADIOGRAPHIC STUDIES: Ct Chest W Contrast  06/24/2015   CLINICAL DATA:  Current history of lung cancer. Shortness of breath. Vocal cord paralysis.  EXAM: CT CHEST WITH CONTRAST  TECHNIQUE: Multidetector CT imaging of the chest was performed during intravenous contrast administration.  CONTRAST:  29m OMNIPAQUE IOHEXOL 300 MG/ML  SOLN  COMPARISON:  CT scan of April 20, 2015 and Feb 17, 2015.  FINDINGS: No pneumothorax or significant pleural effusion is noted. No significant osseous abnormality is noted. Left lung is clear. Stable soft tissue density is noted E in the superior mediastinum and  right suprahilar region compared to prior exam, most consistent with radiation fibrosis. Stable subcarinal adenopathy measuring 19 x 13 mm is noted. Stable small right adrenal nodule is noted. Coronary artery calcifications are noted. Atherosclerosis of thoracic aorta is noted without aneurysm or dissection. 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 Right internal jugular Port-A-Cath is noted with distal tip in the  right atrium. No new mediastinal mass or adenopathy is noted.  IMPRESSION: Stable superior mediastinal and right suprahilar soft tissue density is noted most consistent with radiation fibrosis.  Stable small right adrenal nodule is noted.  Continued presence of stent in superior vena cava which appears to be patent.  Coronary artery calcifications are noted suggesting coronary artery disease.   Electronically Signed   By: Marijo Conception, M.D.   On: 06/24/2015 15:53   ASSESSMENT AND PLAN: This is a very pleasant 59 years old white female with recurrent non-small cell lung cancer status post concurrent chemoradiation as well as consolidation chemotherapy and she is currently on systemic chemotherapy with single agent gemcitabine status post 4 cycles.  She was treated again with a course of systemic chemotherapy with single agent gemcitabine for 3 cycles but the patient has rough time tolerating her treatment with significant fatigue and weakness. The recent CT scan of the chest, abdomen and pelvis showed stable disease but the patient continues to have the persistent right upper lobe area of mass fibrosis and consolidation as well as persistent right hilar, subcarinal and right paratracheal lymphadenopathy as well as, one area of masslike soft tissue immediately anterior to the carina. She was started on treatment with Nivolumab status post 2 cycles. She tolerated the treatment well but she presented with evidence for disease progression in the chest. This treatment  was discontinued. 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 Further imaging studies showed evidence for disease progression. The patient was restarted on systemic chemotherapy initially with carboplatin and gemcitabine body carboplatin was discontinued secondary to hypersensitivity reaction. She continued on treatment with single agent gemcitabine with reduced dose 800 MG/M2 on days 1 and 8 every 3 weeks status post 9 cycles. She is tolerating her treatment fairly well with no significant adverse effects. Her absolute neutrophil count today is low. I recommended for the patient to hold her systemic treatment until improvement of her absolute neutrophil count especially with her schedule surgery next week. She will come back for follow-up visit in 2 weeks for reevaluation before starting cycle #11. For the history of deep venous thrombosis she will continue on Lovenox subcutaneously on daily basis. For pain management, the patient was given a refill of oxycodone. She also has refill of Compazine for nausea. She was advised to call immediately if she has any concerning symptoms in the interval.  All questions were answered. The patient knows to call the clinic with any problems, questions or concerns. We can certainly see the patient much sooner if necessary.   Disclaimer: This note was dictated with voice recognition software. Similar sounding words can inadvertently be transcribed and may not be corrected upon review.

## 2015-07-17 NOTE — Telephone Encounter (Signed)
Gave and printed appt sched and avs for pt for OCT and NOV

## 2015-07-17 NOTE — H&P (Signed)
PREOPERATIVE H&P  Chief Complaint: hoarseness  HPI: Janice Ball is a 59 y.o. female who presents for evaluation of hoarseness for the last several months. She has history of lung cancer treated with chemotherapy and radiation therapy and has been followed by Dr [name]. 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 She's taken to the OR at this time for left vocal cord medialization procedure to improve her voice quality.  Past Medical History  Diagnosis Date  . Anxiety   . Hyperlipidemia   . Hypothyroidism   . COPD (chronic obstructive pulmonary disease)   . Arthritis     thumbs  . 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 Lung cancer dx'd 05/2009  . Lung cancer dx'd 09/2013    recurrence in LN  . Diabetes mellitus   . Pulmonary embolism     'I've had a blood clot in my chest'   Past Surgical History  Procedure Laterality Date  . Thyroidectomy, partial    . Tonsillectomy    . Tubal ligation    . Partial hysterectomy    . Tonsillectomy     Social History   Social History  . Marital Status: Divorced    Spouse Name: N/A  . Number of Children: N/A  . Years of Education: N/A   Occupational History  . transportation coordinator    Social History Main Topics  . Smoking status: Former Smoker -- 0.50 packs/day for 45 years    Types: Cigarettes    Quit date: 11/03/2014  . Smokeless tobacco: Never Used     Comment: pt. started back smoking 10/18/2012, half a pack a day  . Alcohol Use: No  . Drug Use: No  . Sexual Activity: Not Asked   Other Topics Concern  . None   Social History Narrative   Family History  Problem Relation Age of Onset  . Colon cancer Mother   . Cancer Mother     colon  . Diabetes type II Father    Allergies  Allergen Reactions  . Carboplatin Rash    Skin test reaction  . Sulfonamide Derivatives Rash  . Codeine Other (See Comments)     hallucinations  . Dilaudid [Hydromorphone Hcl] Nausea And Vomiting and Other (See Comments)    Room started spinning.  Pt has been okay with low doses and nausea meds administered first  . Penicillins Other (See Comments)    Childhood allergy; reaction unknown  . Vicodin [Hydrocodone-Acetaminophen] Rash   Prior to Admission medications   Medication Sig Start Date End Date Taking? Authorizing Queenie Aufiero  acetaminophen (TYLENOL) 325 MG tablet Take 650 mg by mouth every 6 (six) hours as needed for mild pain or headache.    Yes Historical Leanette Eutsler, MD  alendronate (FOSAMAX) 10 MG tablet Take 10 mg by mouth daily.  03/20/14  Yes Historical Ashmi Blas, MD  atorvastatin (LIPITOR) 80 MG tablet Take 1 tablet (80 mg total) by mouth daily at 6 PM. 04/26/15  Yes Velvet Bathe, MD  benzonatate (TESSALON) 200 MG capsule Take 1 capsule (200 mg total) by mouth 3 (three) times daily as needed for cough. 05/23/15  Yes Tammy S Parrett, NP  budesonide-formoterol (SYMBICORT) 160-4.5 MCG/ACT inhaler Inhale 2 puffs into the lungs 2 (two) times daily.   Yes Historical Jackelynn Hosie, MD  Cetirizine HCl (ZYRTEC ALLERGY) 10 MG CAPS Take 1 capsule by mouth daily as needed.   Yes  Historical Asaiah Scarber, MD  cholecalciferol (VITAMIN D) 1000 UNITS tablet Take 1,000 Units by mouth every morning.    Yes Historical Robertt Buda, MD  [facility] ASPIRIN ADULT LOW DOSE 81 MG chewable tablet Chew 81 mg by mouth daily. 12/06/14  Yes Historical Aliani Caccavale, MD  cyanocobalamin 1000 MCG tablet Take 1,000 mcg by mouth every morning.    Yes Historical Nels Munn, MD  glimepiride (AMARYL) 2 MG tablet Take 2 tablets (4 mg total) by mouth daily with breakfast. Take higher dose (81mby mouth) while on prednisone. Once off prednisone go back to home regimen of 2 mg by mouth daily. 04/26/15  Yes OVelvet Bathe MD  guaiFENesin (MUCINEX) 600 MG 12 hr tablet Take by mouth 2 (two) times daily.   Yes Historical Osman Calzadilla, MD  levothyroxine (SYNTHROID, LEVOTHROID) 137 MCG tablet Take 137 mcg  by mouth daily.  01/15/15  Yes Historical Kaina Orengo, MD  LORazepam (ATIVAN) 1 MG tablet Take 1 mg by mouth 3 (three) times daily as needed for anxiety.    Yes Historical Lorenzo Pereyra, MD  magnesium gluconate (MAGONATE) 500 MG tablet Take 500 mg by mouth every morning.    Yes Historical Devun Anna, MD  metFORMIN (GLUCOPHAGE) 1000 MG tablet Take 1,000 mg by mouth 2 (two) times daily with a meal.   Yes Historical Intisar Claudio, MD  Multiple Vitamin (MULTIVITAMIN WITH MINERALS) TABS tablet Take 1 tablet by mouth every morning.   Yes Historical Darian Cansler, MD  oxyCODONE (OXY IR/ROXICODONE) 5 MG immediate release tablet Take 1 tablet (5 mg total) by mouth every 4 (four) hours as needed for severe pain. 06/25/15  Yes MCurt Bears MD  PROAIR HFA 108 (90 BASE) MCG/ACT inhaler INHALE 2 PUFFS INTO THE LUNGS EVERY 6 HOURS AS NEEDED FOR WHEEZING OR SHORTNESS OF BREATH. 06/17/15  Yes MBrand Males MD  prochlorperazine (COMPAZINE) 10 MG tablet Take 1 tablet (10 mg total) by mouth every 6 (six) hours as needed for nausea or vomiting. 06/25/15  Yes MCurt Bears MD  rivaroxaban (XARELTO) 20 MG TABS tablet Take 1 tablet (20 mg total) by mouth daily with supper. 07/07/15  Yes MCurt Bears MD  sertraline (ZOLOFT) 100 MG tablet Take 150 mg by mouth at bedtime.    Yes Historical Keland Peyton, MD  spironolactone (ALDACTONE) 25 MG tablet Take 25 mg by mouth every morning. 05/27/15  Yes Historical Josef Tourigny, MD  tiotropium (SPIRIVA) 18 MCG inhalation capsule Place 1 capsule (18 mcg total) into inhaler and inhale daily. 04/26/15  Yes OVelvet Bathe MD  predniSONE (DELTASONE) 10 MG tablet  06/03/15   Historical Anthoni Geerts, MD     Positive ROS: weak breathy hoarse voice for 3 months  All other systems have been reviewed and were otherwise negative with the exception of those mentioned in the HPI and as above.  Physical Exam: There were no vitals filed for this visit.  General: Alert, no acute distress Oral: Normal oral mucosa and  tonsils Nasal: Clear nasal passages   FOL revealed paralyzed left TVC in paramedian position Neck: No palpable adenopathy or thyroid nodules Ear: Ear canal is clear with normal appearing TMs Cardiovascular: Regular rate and rhythm, no murmur.  Respiratory: Clear to auscultation Neurologic: Alert and oriented x 3   Assessment/Plan: VOCAL CORD PARALYSIS Plan for Procedure(s): LEFT VOCAL CORD MEDIALIZATION   NMelony Overly MD 07/17/2015 4:44 PM

## 2015-07-21 ENCOUNTER — Other Ambulatory Visit: Payer: Self-pay | Admitting: Medical Oncology

## 2015-07-21 DIAGNOSIS — C3411 Malignant neoplasm of upper lobe, right bronchus or lung: Secondary | ICD-10-CM

## 2015-07-21 MED ORDER — OXYCODONE HCL 5 MG PO TABS: 5.0000 mg | ORAL_TABLET | ORAL | Status: DC | PRN

## 2015-07-21 NOTE — Progress Notes (Signed)
Pt requested refill. Locked in injection room for pickup and pt notified.

## 2015-07-22 ENCOUNTER — Ambulatory Visit (HOSPITAL_BASED_OUTPATIENT_CLINIC_OR_DEPARTMENT_OTHER)
Admission: RE | Admit: 2015-07-22 | Discharge: 2015-07-22 | Disposition: A | Discharge status: Home / Self Care | Payer: Medicare Other | Source: Ambulatory Visit | Attending: Otolaryngology | Admitting: Otolaryngology

## 2015-07-22 ENCOUNTER — Ambulatory Visit (HOSPITAL_BASED_OUTPATIENT_CLINIC_OR_DEPARTMENT_OTHER): Payer: Medicare Other | Admitting: Anesthesiology

## 2015-07-22 ENCOUNTER — Encounter (HOSPITAL_BASED_OUTPATIENT_CLINIC_OR_DEPARTMENT_OTHER)
Admission: RE | Disposition: A | Discharge status: Home / Self Care | Payer: Self-pay | Source: Ambulatory Visit | Attending: Otolaryngology

## 2015-07-22 ENCOUNTER — Encounter (HOSPITAL_BASED_OUTPATIENT_CLINIC_OR_DEPARTMENT_OTHER): Payer: Self-pay | Admitting: *Deleted

## 2015-07-22 DIAGNOSIS — Z87891 Personal history of nicotine dependence: Secondary | ICD-10-CM | POA: Insufficient documentation

## 2015-07-22 DIAGNOSIS — Z85118 Personal history of other malignant neoplasm of bronchus and lung: Secondary | ICD-10-CM | POA: Insufficient documentation

## 2015-07-22 DIAGNOSIS — J3801 Paralysis of vocal cords and larynx, unilateral: Secondary | ICD-10-CM | POA: Diagnosis not present

## 2015-07-22 DIAGNOSIS — Z7952 Long term (current) use of systemic steroids: Secondary | ICD-10-CM | POA: Diagnosis not present

## 2015-07-22 DIAGNOSIS — Z86711 Personal history of pulmonary embolism: Secondary | ICD-10-CM | POA: Insufficient documentation

## 2015-07-22 DIAGNOSIS — Z923 Personal history of irradiation: Secondary | ICD-10-CM | POA: Diagnosis not present

## 2015-07-22 DIAGNOSIS — E039 Hypothyroidism, unspecified: Secondary | ICD-10-CM | POA: Insufficient documentation

## 2015-07-22 DIAGNOSIS — J3800 Paralysis of vocal cords and larynx, unspecified: Secondary | ICD-10-CM | POA: Diagnosis present

## 2015-07-22 DIAGNOSIS — Z7901 Long term (current) use of anticoagulants: Secondary | ICD-10-CM | POA: Insufficient documentation

## 2015-07-22 DIAGNOSIS — Z7982 Long term (current) use of aspirin: Secondary | ICD-10-CM | POA: Insufficient documentation

## 2015-07-22 DIAGNOSIS — Z7983 Long term (current) use of bisphosphonates: Secondary | ICD-10-CM | POA: Diagnosis not present

## 2015-07-22 DIAGNOSIS — Z79891 Long term (current) use of opiate analgesic: Secondary | ICD-10-CM | POA: Insufficient documentation

## 2015-07-22 DIAGNOSIS — Z9221 Personal history of antineoplastic chemotherapy: Secondary | ICD-10-CM | POA: Diagnosis not present

## 2015-07-22 DIAGNOSIS — J449 Chronic obstructive pulmonary disease, unspecified: Secondary | ICD-10-CM | POA: Diagnosis not present

## 2015-07-22 DIAGNOSIS — Z79899 Other long term (current) drug therapy: Secondary | ICD-10-CM | POA: Diagnosis not present

## 2015-07-22 DIAGNOSIS — E785 Hyperlipidemia, unspecified: Secondary | ICD-10-CM | POA: Insufficient documentation

## 2015-07-22 DIAGNOSIS — Z7984 Long term (current) use of oral hypoglycemic drugs: Secondary | ICD-10-CM | POA: Insufficient documentation

## 2015-07-22 DIAGNOSIS — Z7951 Long term (current) use of inhaled steroids: Secondary | ICD-10-CM | POA: Insufficient documentation

## 2015-07-22 DIAGNOSIS — E119 Type 2 diabetes mellitus without complications: Secondary | ICD-10-CM | POA: Insufficient documentation

## 2015-07-22 DIAGNOSIS — M1990 Unspecified osteoarthritis, unspecified site: Secondary | ICD-10-CM | POA: Insufficient documentation

## 2015-07-22 DIAGNOSIS — F419 Anxiety disorder, unspecified: Secondary | ICD-10-CM | POA: Diagnosis not present

## 2015-07-22 HISTORY — PX: LARYNGOPLASTY: SHX282

## 2015-07-22 HISTORY — DX: Other pulmonary embolism without acute cor pulmonale: I26.99

## 2015-07-22 LAB — GLUCOSE, CAPILLARY
Glucose-Capillary: 168 mg/dL — ABNORMAL HIGH (ref 65–99)
Glucose-Capillary: 169 mg/dL — ABNORMAL HIGH (ref 65–99)

## 2015-07-22 SURGERY — LARYNGOPLASTY
Anesthesia: General | Site: Neck | Laterality: Left

## 2015-07-22 MED ORDER — MIDAZOLAM HCL 2 MG/2ML IJ SOLN
INTRAMUSCULAR | Status: AC
  Filled 2015-07-22: qty 4

## 2015-07-22 MED ORDER — MIDAZOLAM HCL 5 MG/5ML IJ SOLN
INTRAMUSCULAR | Status: DC | PRN
  Administered 2015-07-22 (×2): 1 mg via INTRAVENOUS

## 2015-07-22 MED ORDER — EPHEDRINE SULFATE 50 MG/ML IJ SOLN
INTRAMUSCULAR | Status: AC
  Filled 2015-07-22: qty 1

## 2015-07-22 MED ORDER — COCAINE HCL 4 % EX SOLN
CUTANEOUS | Status: AC
  Filled 2015-07-22: qty 4

## 2015-07-22 MED ORDER — PROPOFOL 10 MG/ML IV BOLUS
INTRAVENOUS | Status: DC | PRN
  Administered 2015-07-22 (×2): 10 mg via INTRAVENOUS

## 2015-07-22 MED ORDER — SCOPOLAMINE 1 MG/3DAYS TD PT72
MEDICATED_PATCH | TRANSDERMAL | Status: AC
  Filled 2015-07-22: qty 1

## 2015-07-22 MED ORDER — COCAINE HCL 4 % EX SOLN
CUTANEOUS | Status: DC | PRN
  Administered 2015-07-22: 2 mL via NASAL

## 2015-07-22 MED ORDER — CEFAZOLIN SODIUM-DEXTROSE 2-3 GM-% IV SOLR
INTRAVENOUS | Status: AC
  Filled 2015-07-22: qty 50

## 2015-07-22 MED ORDER — LIDOCAINE HCL (CARDIAC) 20 MG/ML IV SOLN
INTRAVENOUS | Status: DC | PRN
  Administered 2015-07-22: 5 mg via INTRAVENOUS

## 2015-07-22 MED ORDER — METHYLENE BLUE 1 % INJ SOLN
INTRAMUSCULAR | Status: AC
  Filled 2015-07-22: qty 10

## 2015-07-22 MED ORDER — LIDOCAINE-EPINEPHRINE 1 %-1:100000 IJ SOLN
INTRAMUSCULAR | Status: AC
  Filled 2015-07-22: qty 1

## 2015-07-22 MED ORDER — GLYCOPYRROLATE 0.2 MG/ML IJ SOLN: 0.2000 mg | Freq: Once | INTRAMUSCULAR | Status: DC | PRN

## 2015-07-22 MED ORDER — SCOPOLAMINE 1 MG/3DAYS TD PT72
1.0000 | MEDICATED_PATCH | Freq: Once | TRANSDERMAL | Status: DC | PRN
  Administered 2015-07-22: 1.5 mg via TRANSDERMAL

## 2015-07-22 MED ORDER — DEXAMETHASONE SODIUM PHOSPHATE 10 MG/ML IJ SOLN
INTRAMUSCULAR | Status: AC
  Filled 2015-07-22: qty 1

## 2015-07-22 MED ORDER — FENTANYL CITRATE (PF) 100 MCG/2ML IJ SOLN
INTRAMUSCULAR | Status: DC | PRN
  Administered 2015-07-22 (×2): 50 ug via INTRAVENOUS

## 2015-07-22 MED ORDER — PROPOFOL 500 MG/50ML IV EMUL
INTRAVENOUS | Status: AC
  Filled 2015-07-22: qty 50

## 2015-07-22 MED ORDER — HYDROCODONE-ACETAMINOPHEN 5-325 MG PO TABS: 1.0000 | ORAL_TABLET | Freq: Four times a day (QID) | ORAL | Status: DC | PRN

## 2015-07-22 MED ORDER — ONDANSETRON HCL 4 MG/2ML IJ SOLN
INTRAMUSCULAR | Status: AC
  Filled 2015-07-22: qty 2

## 2015-07-22 MED ORDER — FENTANYL CITRATE (PF) 100 MCG/2ML IJ SOLN
INTRAMUSCULAR | Status: AC
  Filled 2015-07-22: qty 4

## 2015-07-22 MED ORDER — SUCCINYLCHOLINE CHLORIDE 20 MG/ML IJ SOLN
INTRAMUSCULAR | Status: AC
  Filled 2015-07-22: qty 1

## 2015-07-22 MED ORDER — LIDOCAINE-EPINEPHRINE 1 %-1:100000 IJ SOLN
INTRAMUSCULAR | Status: DC | PRN
  Administered 2015-07-22: 11 mL

## 2015-07-22 MED ORDER — CEPHALEXIN 500 MG PO CAPS: 500.0000 mg | ORAL_CAPSULE | Freq: Two times a day (BID) | ORAL | Status: DC

## 2015-07-22 MED ORDER — EPINEPHRINE HCL 1 MG/ML IJ SOLN
INTRAMUSCULAR | Status: AC
  Filled 2015-07-22: qty 1

## 2015-07-22 MED ORDER — OXYMETAZOLINE HCL 0.05 % NA SOLN
NASAL | Status: AC
  Filled 2015-07-22: qty 15

## 2015-07-22 MED ORDER — LACTATED RINGERS IV SOLN
INTRAVENOUS | Status: DC
  Administered 2015-07-22: 07:00:00 via INTRAVENOUS
  Administered 2015-07-22: 10 mL/h via INTRAVENOUS

## 2015-07-22 MED ORDER — DEXAMETHASONE SODIUM PHOSPHATE 4 MG/ML IJ SOLN
INTRAMUSCULAR | Status: DC | PRN
  Administered 2015-07-22 (×2): 4 mg via INTRAVENOUS

## 2015-07-22 MED ORDER — ONDANSETRON HCL 4 MG/2ML IJ SOLN
INTRAMUSCULAR | Status: DC | PRN
  Administered 2015-07-22: 4 mg via INTRAVENOUS

## 2015-07-22 MED ORDER — CEFAZOLIN SODIUM-DEXTROSE 2-3 GM-% IV SOLR
2.0000 g | INTRAVENOUS | Status: AC
  Administered 2015-07-22: 2 g via INTRAVENOUS

## 2015-07-22 MED ORDER — LIDOCAINE HCL (CARDIAC) 20 MG/ML IV SOLN
INTRAVENOUS | Status: AC
  Filled 2015-07-22: qty 5

## 2015-07-22 SURGICAL SUPPLY — 55 items
ATTRACTOMAT 16X20 MAGNETIC DRP (DRAPES) ×3 IMPLANT
BENZOIN TINCTURE PRP APPL 2/3 (GAUZE/BANDAGES/DRESSINGS) IMPLANT
BIT DRILL 67X1.5XWRPS STRL (BIT) IMPLANT
BIT DRL 67X1.5XWRPS STRL (BIT)
BLADE OSC/SAG .038X5.5 CUT EDG (BLADE) IMPLANT
BLADE SURG 10 STRL SS (BLADE) ×3 IMPLANT
BLADE SURG 15 STRL LF DISP TIS (BLADE) IMPLANT
BLADE SURG 15 STRL SS (BLADE)
BUR FAST CUTTING MED (BURR) IMPLANT
BUR FISSURE CARBIDE (BURR) IMPLANT
BUR ROUND CARBIDE (BURR) IMPLANT
CANISTER SUCT 1200ML W/VALVE (MISCELLANEOUS) ×3 IMPLANT
CLEANER CAUTERY TIP 5X5 PAD (MISCELLANEOUS) ×1 IMPLANT
CLOSURE WOUND 1/4X4 (GAUZE/BANDAGES/DRESSINGS)
CORDS BIPOLAR (ELECTRODE) IMPLANT
COVER MAYO STAND STRL (DRAPES) IMPLANT
DRILL BIT WIRE PASS (BIT)
ELECT COATED BLADE 2.86 ST (ELECTRODE) ×3 IMPLANT
ELECT NEEDLE BLADE 2-5/6 (NEEDLE) IMPLANT
ELECT REM PT RETURN 9FT ADLT (ELECTROSURGICAL) ×3
ELECTRODE REM PT RTRN 9FT ADLT (ELECTROSURGICAL) ×1 IMPLANT
GAUZE PACKING 1/2X5YD (GAUZE/BANDAGES/DRESSINGS) IMPLANT
GLOVE BIOGEL PI IND STRL 7.0 (GLOVE) ×1 IMPLANT
GLOVE BIOGEL PI INDICATOR 7.0 (GLOVE) ×2
GLOVE SS BIOGEL STRL SZ 7.5 (GLOVE) ×1 IMPLANT
GLOVE SUPERSENSE BIOGEL SZ 7.5 (GLOVE) ×2
GLOVE SURG SS PI 7.0 STRL IVOR (GLOVE) ×3 IMPLANT
GOWN STRL REUS W/ TWL LRG LVL3 (GOWN DISPOSABLE) ×1 IMPLANT
GOWN STRL REUS W/ TWL XL LVL3 (GOWN DISPOSABLE) ×1 IMPLANT
GOWN STRL REUS W/TWL LRG LVL3 (GOWN DISPOSABLE) ×2
GOWN STRL REUS W/TWL XL LVL3 (GOWN DISPOSABLE) ×2
NEEDLE HYPO 25X1 1.5 SAFETY (NEEDLE) ×6 IMPLANT
NS IRRIG 1000ML POUR BTL (IV SOLUTION) ×3 IMPLANT
PACK BASIN DAY SURGERY FS (CUSTOM PROCEDURE TRAY) ×3 IMPLANT
PACK ENT DAY SURGERY (CUSTOM PROCEDURE TRAY) ×3 IMPLANT
PAD CLEANER CAUTERY TIP 5X5 (MISCELLANEOUS) ×2
PATTIES SURGICAL .5 X3 (DISPOSABLE) ×3 IMPLANT
PENCIL BUTTON HOLSTER BLD 10FT (ELECTRODE) ×3 IMPLANT
RUBBERBAND STERILE (MISCELLANEOUS) IMPLANT
SLEEVE SCD COMPRESS KNEE MED (MISCELLANEOUS) ×3 IMPLANT
SOLUTION ANTI FOG 6CC (MISCELLANEOUS) ×3 IMPLANT
SPONGE GAUZE 4X4 12PLY STER LF (GAUZE/BANDAGES/DRESSINGS) IMPLANT
STRIP CLOSURE SKIN 1/4X4 (GAUZE/BANDAGES/DRESSINGS) IMPLANT
SUT CHROMIC 3 0 PS 2 (SUTURE) ×3 IMPLANT
SUT CHROMIC 4 0 P 3 18 (SUTURE) IMPLANT
SUT ETHILON 4 0 P 3 18 (SUTURE) IMPLANT
SUT ETHILON 5 0 P 3 18 (SUTURE)
SUT NYLON ETHILON 5-0 P-3 1X18 (SUTURE) IMPLANT
SUT SILK 4 0 TIES 17X18 (SUTURE) IMPLANT
SUT VIC AB 5-0 P-3 18X BRD (SUTURE) IMPLANT
SUT VIC AB 5-0 P3 18 (SUTURE)
SYR BULB 3OZ (MISCELLANEOUS) IMPLANT
SYR CONTROL 10ML LL (SYRINGE) IMPLANT
TOWEL OR 17X24 6PK STRL BLUE (TOWEL DISPOSABLE) ×3 IMPLANT
TRAY DSU PREP LF (CUSTOM PROCEDURE TRAY) ×3 IMPLANT

## 2015-07-22 NOTE — Discharge Instructions (Addendum)
Take your regular meds Restart your Xarelto tomorrow Diet as tolerated Start Keflex 500 mg twice per day today Tylenol, motrin or Hydrocodone prn pain Keep incision site dry for 24 hrs Call Dr Pollie Friar office for follow up appt in 1 week next [place] or Tues      [place] Instructions  Activity: Get plenty of rest for the remainder of the day. A responsible adult should stay with you for 24 hours following the procedure.  For the next 24 hours, DO NOT: -Drive a car -Paediatric nurse -Drink alcoholic beverages -Take any medication unless instructed by your physician -Make any legal decisions or sign important papers.  Meals: Start with liquid foods such as gelatin or soup. Progress to regular foods as tolerated. Avoid greasy, spicy, heavy foods. If nausea and/or vomiting occur, drink only clear liquids until the nausea and/or vomiting subsides. Call your physician if vomiting continues.  Special Instructions/Symptoms: Your throat may feel dry or sore from the anesthesia or the breathing tube placed in your throat during surgery. If this causes discomfort, gargle with warm salt water. The discomfort should disappear within 24 hours.  If you had a scopolamine patch placed behind your ear for the management of post- operative nausea and/or vomiting:  1. The medication in the patch is effective for 72 hours, after which it should be removed.  Wrap patch in a tissue and discard in the trash. Wash hands thoroughly with soap and water. 2. You may remove the patch earlier than 72 hours if you experience unpleasant side effects which may include dry mouth, dizziness or visual disturbances. 3. Avoid touching the patch. Wash your hands with soap and water after contact with the patch.

## 2015-07-22 NOTE — Anesthesia Preprocedure Evaluation (Addendum)
Anesthesia Evaluation  Patient identified by MRN, date of birth, ID band Patient awake    Reviewed: Allergy & Precautions, NPO status , Patient's Chart, lab work & pertinent test results  Airway Mallampati: I       Dental  (+) Upper Dentures, Lower Dentures, Dental Advisory Given   Pulmonary COPD,  COPD inhaler, former smoker,    breath sounds clear to auscultation       Cardiovascular  Rhythm:Regular Rate:Normal     Neuro/Psych    GI/Hepatic   Endo/Other  diabetes, Well Controlled, Type 2, Oral Hypoglycemic AgentsHypothyroidism   Renal/GU      Musculoskeletal   Abdominal   Peds  Hematology   Anesthesia Other Findings   Reproductive/Obstetrics                           Anesthesia Physical Anesthesia Plan  ASA: III  Anesthesia Plan: General   Post-op Pain Management:    Induction: Intravenous  Airway Management Planned: Nasal Cannula  Additional Equipment:   Intra-op Plan:   Post-operative Plan: Extubation in OR  Informed Consent: I have reviewed the patients History and Physical, chart, labs and discussed the procedure including the risks, benefits and alternatives for the proposed anesthesia with the patient or authorized representative who has indicated his/her understanding and acceptance.   Dental advisory given  Plan Discussed with: CRNA, Anesthesiologist and Surgeon  Anesthesia Plan Comments:        Anesthesia Quick Evaluation

## 2015-07-22 NOTE — Interval H&P Note (Signed)
History and Physical Interval Note:  07/22/2015 7:27 AM  Janice Ball  has presented today for surgery, with the diagnosis of VOCAL CORD PARALYSIS  The various methods of treatment have been discussed with the patient and family. After consideration of risks, benefits and other options for treatment, the patient has consented to  Procedure(s): LEFT VOCAL CORD MEDIALIZATION (Left) as a surgical intervention .  The patient's history has been reviewed, patient examined, no change in status, stable for surgery.  I have reviewed the patient's chart and labs.  Questions were answered to the patient's satisfaction.     [name]

## 2015-07-22 NOTE — Transfer of Care (Signed)
Immediate Anesthesia Transfer of Care Note  Patient: Janice Ball  Procedure(s) Performed: Procedure(s): LEFT VOCAL CORD MEDIALIZATION (Left)  Patient Location: PACU  Anesthesia Type:MAC  Level of Consciousness: awake, alert  and oriented  Airway & Oxygen Therapy: Patient Spontanous Breathing and Patient connected to nasal cannula oxygen  Post-op Assessment: Report given to RN  Post vital signs: Reviewed and stable  Last Vitals:  Filed Vitals:   07/22/15 0640  BP: 146/61  Pulse: 134  Temp: 36.6 C  Resp: 20    Complications: No apparent anesthesia complications

## 2015-07-22 NOTE — Brief Op Note (Signed)
07/22/2015  9:13 AM  PATIENT:  Janice Ball  59 y.o. female  PRE-OPERATIVE DIAGNOSIS:  VOCAL CORD PARALYSIS  POST-OPERATIVE DIAGNOSIS:  VOCAL CORD PARALYSIS  PROCEDURE:  Procedure(s): LEFT VOCAL CORD MEDIALIZATION (Left)  SURGEON:  Surgeon(s) and Role:    * Rozetta Nunnery, MD - Primary  PHYSICIAN ASSISTANT:   ASSISTANTS: none   ANESTHESIA:   IV sedation  EBL:  Total I/O In: 700 [I.V.:700] Out: -   BLOOD ADMINISTERED:none  DRAINS: none   LOCAL MEDICATIONS USED:  XYLOCAINE with EPI  12 cc  SPECIMEN:  No Specimen  DISPOSITION OF SPECIMEN:  N/A  COUNTS:  YES  TOURNIQUET:  * No tourniquets in log *  DICTATION: .Other Dictation: Dictation Number [id number]  PLAN OF CARE: Discharge to home after PACU  PATIENT DISPOSITION:  PACU - hemodynamically stable.   Delay start of Pharmacological VTE agent (>24hrs) due to surgical blood loss or risk of bleeding: yes

## 2015-07-22 NOTE — Anesthesia Postprocedure Evaluation (Signed)
  Anesthesia Post-op Note  Patient: Janice Ball  Procedure(s) Performed: Procedure(s): LEFT VOCAL CORD MEDIALIZATION (Left)  Patient Location: PACU  Anesthesia Type: General   Level of Consciousness: awake, alert  and oriented  Airway and Oxygen Therapy: Patient Spontanous Breathing  Post-op Pain: none  Post-op Assessment: Post-op Vital signs reviewed  Post-op Vital Signs: Reviewed  Last Vitals:  Filed Vitals:   07/22/15 1025  BP: 144/60  Pulse: 120  Temp: 36.9 C  Resp: 22    Complications: No apparent anesthesia complications

## 2015-07-22 NOTE — Op Note (Signed)
Janice Ball, Janice Ball NO.:  [id number]  MEDICAL RECORD NO.:  [id number]  LOCATION:                               FACILITY:  [facility]  PHYSICIAN:  Leonides Sake. [name], M.D.DATE OF BIRTH:  05/27/1956  DATE OF PROCEDURE:  07/22/2015 DATE OF DISCHARGE:  07/22/2015                              OPERATIVE REPORT   PREOPERATIVE DIAGNOSIS:  Left vocal cord paralysis with weak breathy voice.  POSTOPERATIVE DIAGNOSIS:  Left vocal cord paralysis with weak breathy voice.  OPERATION PERFORMED:  Left medialization thyroplasty.  SURGEON:  Leonides Sake. [name], M.D.  ANESTHESIA:  IV sedation with local anesthetic Xylocaine with epinephrine10-12 mL.  COMPLICATIONS:  None.  BRIEF CLINICAL NOTE:  Janice Ball is a 59 year old female who has a history of lung cancer, treated and followed by Dr. [name].  She subsequently developed a left vocal cord paralysis over the last 4-5 months and has a very weak breathy voice.  On exam in the office, she has a paralyzed left vocal cord with no mobility in a paramedian position and has a wide glottic gap on speech.  She is taken to the operating room at this time for medialization of the left vocal cord via an open thyroplasty technique under local anesthetic and IV sedation.  DESCRIPTION OF PROCEDURE:  The patient received 2 g Ancef IV preoperatively as well as 8 mg of Decadron.  The patient's a neck was prepped with Betadine solution, draped with dry sterile towels.  The larynx was marked out with a marking pen and a horizontal incision was made directly over the larynx for about 4 cm.  Dissection was carried down through the subcutaneous tissue and platysma muscle.  The strap muscles were divided at the midline of the larynx and retracted laterally on the left side.  The patient had been previously injected with 10 mL of Xylocaine with epinephrine for local anesthetic and tolerated this well.  The mucoperichondrium in the left  side of the larynx was incised and elevated.  The superior and inferior aspects of the left laryngeal cartilage were dissected out; and following this, measurements were made, and an approximate 6 x 12 mm window through the laryngeal cartilage was created on the left side in the inferior one- half of the larynx, approximately 8 mm above the inferior aspect of the larynx and approximately 8 mm from the anterior aspect of the larynx. After removing the cartilaginous window, the mucoperichondrium was elevated posteriorly inferiorly and opened superiorly and anteriorly on the inside of the laryngeal cartilage.  First, using the [facility], a gentle pressure was placed on the mucoperichondrium anteriorly to get an estimate how much medialization was needed to improve the voice. Following this, a silastic implant was cut to appropriate size medializing the vocal cord approximately 4-5 mm with improved voice. After trying a few other pieces of silastic to maximize the voice, two pieces of silastic were cut, one placed posteriorly and another one anteriorly so as to wedge into the window so they would not lateralize. The voice was of much improved quality.  The patient was satisfied with the voice.  The mucoperichondrium flap was then closed with a 2-0 chromic  suture.  Subcutaneous tissue was closed with 2-0 chromic suture; and then, Dermabond was used to reapproximate the skin edges anteriorly. Janice Ball tolerated this well, was awoken from anesthesia and transferred to the recovery room postoperatively doing well.  DISPOSITION:  Janice Ball will be discharged home on Keflex 5 mg b.i.d. for 5 days, Tylenol, Motrin p.r.n. pain; and will restart her Xarelto tomorrow.  She will continue with the regular medications otherwise. She will follow up in my office in one week for recheck.          ______________________________ Leonides Sake. [name], M.D.     CEN/MEDQ  D:  07/22/2015  T:  07/22/2015   Job:  [id number]  cc:   Eilleen Kempf, M.D. [name], NP

## 2015-07-23 ENCOUNTER — Encounter (HOSPITAL_BASED_OUTPATIENT_CLINIC_OR_DEPARTMENT_OTHER): Payer: Self-pay | Admitting: Otolaryngology

## 2015-07-24 ENCOUNTER — Encounter (HOSPITAL_COMMUNITY): Payer: Self-pay | Admitting: Emergency Medicine

## 2015-07-24 ENCOUNTER — Other Ambulatory Visit: Payer: Self-pay | Admitting: Nurse Practitioner

## 2015-07-24 ENCOUNTER — Other Ambulatory Visit (HOSPITAL_BASED_OUTPATIENT_CLINIC_OR_DEPARTMENT_OTHER): Payer: Medicare Other

## 2015-07-24 ENCOUNTER — Observation Stay (HOSPITAL_COMMUNITY)
Admission: EM | Admit: 2015-07-24 | Discharge: 2015-07-25 | Disposition: A | Discharge status: Home / Self Care | Payer: Medicare Other | Attending: Internal Medicine | Admitting: Internal Medicine

## 2015-07-24 ENCOUNTER — Ambulatory Visit (HOSPITAL_BASED_OUTPATIENT_CLINIC_OR_DEPARTMENT_OTHER): Payer: Medicare Other | Admitting: Nurse Practitioner

## 2015-07-24 VITALS — BP 156/76 | HR 130 | Temp 97.8°F | Resp 24

## 2015-07-24 DIAGNOSIS — Z7982 Long term (current) use of aspirin: Secondary | ICD-10-CM | POA: Diagnosis not present

## 2015-07-24 DIAGNOSIS — C3411 Malignant neoplasm of upper lobe, right bronchus or lung: Secondary | ICD-10-CM

## 2015-07-24 DIAGNOSIS — J3801 Paralysis of vocal cords and larynx, unilateral: Secondary | ICD-10-CM | POA: Diagnosis present

## 2015-07-24 DIAGNOSIS — J449 Chronic obstructive pulmonary disease, unspecified: Secondary | ICD-10-CM | POA: Diagnosis not present

## 2015-07-24 DIAGNOSIS — Z86718 Personal history of other venous thrombosis and embolism: Secondary | ICD-10-CM

## 2015-07-24 DIAGNOSIS — J383 Other diseases of vocal cords: Secondary | ICD-10-CM | POA: Diagnosis not present

## 2015-07-24 DIAGNOSIS — Z79891 Long term (current) use of opiate analgesic: Secondary | ICD-10-CM | POA: Insufficient documentation

## 2015-07-24 DIAGNOSIS — E785 Hyperlipidemia, unspecified: Secondary | ICD-10-CM | POA: Diagnosis not present

## 2015-07-24 DIAGNOSIS — Z7983 Long term (current) use of bisphosphonates: Secondary | ICD-10-CM | POA: Insufficient documentation

## 2015-07-24 DIAGNOSIS — Z7901 Long term (current) use of anticoagulants: Secondary | ICD-10-CM | POA: Insufficient documentation

## 2015-07-24 DIAGNOSIS — Z79899 Other long term (current) drug therapy: Secondary | ICD-10-CM | POA: Insufficient documentation

## 2015-07-24 DIAGNOSIS — Z7984 Long term (current) use of oral hypoglycemic drugs: Secondary | ICD-10-CM | POA: Diagnosis not present

## 2015-07-24 DIAGNOSIS — J384 Edema of larynx: Secondary | ICD-10-CM | POA: Insufficient documentation

## 2015-07-24 DIAGNOSIS — E039 Hypothyroidism, unspecified: Secondary | ICD-10-CM | POA: Diagnosis not present

## 2015-07-24 DIAGNOSIS — I82629 Acute embolism and thrombosis of deep veins of unspecified upper extremity: Secondary | ICD-10-CM | POA: Diagnosis not present

## 2015-07-24 DIAGNOSIS — R000 Tachycardia, unspecified: Secondary | ICD-10-CM | POA: Diagnosis not present

## 2015-07-24 DIAGNOSIS — Z7951 Long term (current) use of inhaled steroids: Secondary | ICD-10-CM | POA: Insufficient documentation

## 2015-07-24 DIAGNOSIS — J9589 Other postprocedural complications and disorders of respiratory system, not elsewhere classified: Principal | ICD-10-CM | POA: Insufficient documentation

## 2015-07-24 DIAGNOSIS — E119 Type 2 diabetes mellitus without complications: Secondary | ICD-10-CM | POA: Insufficient documentation

## 2015-07-24 DIAGNOSIS — E1165 Type 2 diabetes mellitus with hyperglycemia: Secondary | ICD-10-CM

## 2015-07-24 DIAGNOSIS — F419 Anxiety disorder, unspecified: Secondary | ICD-10-CM | POA: Diagnosis not present

## 2015-07-24 DIAGNOSIS — I82622 Acute embolism and thrombosis of deep veins of left upper extremity: Secondary | ICD-10-CM | POA: Diagnosis not present

## 2015-07-24 DIAGNOSIS — Z86711 Personal history of pulmonary embolism: Secondary | ICD-10-CM | POA: Insufficient documentation

## 2015-07-24 DIAGNOSIS — D709 Neutropenia, unspecified: Secondary | ICD-10-CM

## 2015-07-24 DIAGNOSIS — R220 Localized swelling, mass and lump, head: Secondary | ICD-10-CM | POA: Insufficient documentation

## 2015-07-24 DIAGNOSIS — I82409 Acute embolism and thrombosis of unspecified deep veins of unspecified lower extremity: Secondary | ICD-10-CM | POA: Diagnosis present

## 2015-07-24 DIAGNOSIS — Z87891 Personal history of nicotine dependence: Secondary | ICD-10-CM | POA: Insufficient documentation

## 2015-07-24 LAB — CBC WITH DIFFERENTIAL/PLATELET
BASO%: 0.3 % (ref 0.0–2.0)
Basophils Absolute: 0 10*3/uL (ref 0.0–0.1)
EOS%: 2 % (ref 0.0–7.0)
Eosinophils Absolute: 0.1 10*3/uL (ref 0.0–0.5)
HCT: 32.4 % — ABNORMAL LOW (ref 34.8–46.6)
HGB: 10 g/dL — ABNORMAL LOW (ref 11.6–15.9)
LYMPH%: 10 % — AB (ref 14.0–49.7)
MCH: 26.5 pg (ref 25.1–34.0)
MCHC: 30.9 g/dL — AB (ref 31.5–36.0)
MCV: 85.7 fL (ref 79.5–101.0)
MONO#: 0.7 10*3/uL (ref 0.1–0.9)
MONO%: 10.1 % (ref 0.0–14.0)
NEUT%: 77.6 % — ABNORMAL HIGH (ref 38.4–76.8)
NEUTROS ABS: 5.1 10*3/uL (ref 1.5–6.5)
PLATELETS: 207 10*3/uL (ref 145–400)
RBC: 3.78 10*6/uL (ref 3.70–5.45)
RDW: 18.4 % — ABNORMAL HIGH (ref 11.2–14.5)
WBC: 6.5 10*3/uL (ref 3.9–10.3)
lymph#: 0.7 10*3/uL — ABNORMAL LOW (ref 0.9–3.3)

## 2015-07-24 LAB — GLUCOSE, CAPILLARY: GLUCOSE-CAPILLARY: 270 mg/dL — AB (ref 65–99)

## 2015-07-24 LAB — COMPREHENSIVE METABOLIC PANEL (CC13)
ALT: 11 U/L (ref 0–55)
AST: 18 U/L (ref 5–34)
Albumin: 2.9 g/dL — ABNORMAL LOW (ref 3.5–5.0)
Alkaline Phosphatase: 89 U/L (ref 40–150)
Anion Gap: 7 mEq/L (ref 3–11)
BUN: 6.7 mg/dL — ABNORMAL LOW (ref 7.0–26.0)
CO2: 27 meq/L (ref 22–29)
CREATININE: 0.8 mg/dL (ref 0.6–1.1)
Calcium: 8.1 mg/dL — ABNORMAL LOW (ref 8.4–10.4)
Chloride: 104 mEq/L (ref 98–109)
EGFR: 83 mL/min/{1.73_m2} — ABNORMAL LOW (ref >90)
GLUCOSE: 360 mg/dL — AB (ref 70–140)
Potassium: 4.3 mEq/L (ref 3.5–5.1)
SODIUM: 139 meq/L (ref 136–145)
TOTAL PROTEIN: 6.3 g/dL — AB (ref 6.4–8.3)

## 2015-07-24 MED ORDER — DEXAMETHASONE SODIUM PHOSPHATE 10 MG/ML IJ SOLN
10.0000 mg | Freq: Once | INTRAMUSCULAR | Status: AC
  Administered 2015-07-24: 10 mg via INTRAVENOUS
  Filled 2015-07-24: qty 1

## 2015-07-24 MED ORDER — LEVOTHYROXINE SODIUM 137 MCG PO TABS
137.0000 ug | ORAL_TABLET | Freq: Every day | ORAL | Status: DC
  Administered 2015-07-25: 137 ug via ORAL
  Filled 2015-07-24 (×2): qty 1

## 2015-07-24 MED ORDER — LORAZEPAM 1 MG PO TABS: 1.0000 mg | ORAL_TABLET | Freq: Three times a day (TID) | ORAL | Status: DC | PRN

## 2015-07-24 MED ORDER — BUDESONIDE-FORMOTEROL FUMARATE 160-4.5 MCG/ACT IN AERO
2.0000 | INHALATION_SPRAY | Freq: Two times a day (BID) | RESPIRATORY_TRACT | Status: DC
  Administered 2015-07-24 – 2015-07-25 (×2): 2 via RESPIRATORY_TRACT
  Filled 2015-07-24: qty 6

## 2015-07-24 MED ORDER — TIOTROPIUM BROMIDE MONOHYDRATE 18 MCG IN CAPS
18.0000 ug | ORAL_CAPSULE | Freq: Every day | RESPIRATORY_TRACT | Status: DC
  Administered 2015-07-25: 18 ug via RESPIRATORY_TRACT
  Filled 2015-07-24: qty 5

## 2015-07-24 MED ORDER — VITAMIN C 500 MG PO TABS
500.0000 mg | ORAL_TABLET | Freq: Two times a day (BID) | ORAL | Status: DC
  Administered 2015-07-24 – 2015-07-25 (×2): 500 mg via ORAL
  Filled 2015-07-24 (×3): qty 1

## 2015-07-24 MED ORDER — BENZONATATE 100 MG PO CAPS: 200.0000 mg | ORAL_CAPSULE | Freq: Three times a day (TID) | ORAL | Status: DC | PRN

## 2015-07-24 MED ORDER — ADULT MULTIVITAMIN W/MINERALS CH
1.0000 | ORAL_TABLET | Freq: Every morning | ORAL | Status: DC
  Administered 2015-07-25: 1 via ORAL
  Filled 2015-07-24: qty 1

## 2015-07-24 MED ORDER — SERTRALINE HCL 50 MG PO TABS
150.0000 mg | ORAL_TABLET | Freq: Every day | ORAL | Status: DC
  Administered 2015-07-24: 150 mg via ORAL
  Filled 2015-07-24 (×2): qty 1

## 2015-07-24 MED ORDER — MAGNESIUM GLUCONATE 500 MG PO TABS
500.0000 mg | ORAL_TABLET | Freq: Every day | ORAL | Status: DC
  Administered 2015-07-25: 500 mg via ORAL
  Filled 2015-07-24: qty 1

## 2015-07-24 MED ORDER — INSULIN ASPART 100 UNIT/ML ~~LOC~~ SOLN
0.0000 [IU] | Freq: Every day | SUBCUTANEOUS | Status: DC
  Administered 2015-07-24: 3 [IU] via SUBCUTANEOUS

## 2015-07-24 MED ORDER — OXYCODONE HCL 5 MG PO TABS
5.0000 mg | ORAL_TABLET | ORAL | Status: DC | PRN
  Administered 2015-07-24: 5 mg via ORAL
  Filled 2015-07-24: qty 1

## 2015-07-24 MED ORDER — ONDANSETRON HCL 4 MG/2ML IJ SOLN: 4.0000 mg | Freq: Four times a day (QID) | INTRAMUSCULAR | Status: DC | PRN

## 2015-07-24 MED ORDER — VITAMIN D3 25 MCG (1000 UNIT) PO TABS
1000.0000 [IU] | ORAL_TABLET | Freq: Every morning | ORAL | Status: DC
  Administered 2015-07-25: 1000 [IU] via ORAL
  Filled 2015-07-24: qty 1

## 2015-07-24 MED ORDER — GLIMEPIRIDE 4 MG PO TABS
4.0000 mg | ORAL_TABLET | Freq: Every day | ORAL | Status: DC
  Administered 2015-07-25: 4 mg via ORAL
  Filled 2015-07-24 (×2): qty 1

## 2015-07-24 MED ORDER — ALBUTEROL SULFATE HFA 108 (90 BASE) MCG/ACT IN AERS: 2.0000 | INHALATION_SPRAY | Freq: Four times a day (QID) | RESPIRATORY_TRACT | Status: DC | PRN

## 2015-07-24 MED ORDER — GUAIFENESIN ER 600 MG PO TB12
600.0000 mg | ORAL_TABLET | Freq: Two times a day (BID) | ORAL | Status: DC
  Administered 2015-07-24 – 2015-07-25 (×2): 600 mg via ORAL
  Filled 2015-07-24 (×3): qty 1

## 2015-07-24 MED ORDER — ALENDRONATE SODIUM 10 MG PO TABS: 10.0000 mg | ORAL_TABLET | Freq: Every day | ORAL | Status: DC

## 2015-07-24 MED ORDER — SPIRONOLACTONE 25 MG PO TABS
25.0000 mg | ORAL_TABLET | Freq: Every morning | ORAL | Status: DC
  Administered 2015-07-25: 25 mg via ORAL
  Filled 2015-07-24: qty 1

## 2015-07-24 MED ORDER — DIPHENHYDRAMINE HCL 25 MG PO CAPS
50.0000 mg | ORAL_CAPSULE | Freq: Every evening | ORAL | Status: DC | PRN
Start: 2015-07-24 — End: 2015-07-25

## 2015-07-24 MED ORDER — INSULIN ASPART 100 UNIT/ML ~~LOC~~ SOLN
0.0000 [IU] | Freq: Three times a day (TID) | SUBCUTANEOUS | Status: DC
  Administered 2015-07-25: 3 [IU] via SUBCUTANEOUS

## 2015-07-24 MED ORDER — ONDANSETRON HCL 4 MG PO TABS: 4.0000 mg | ORAL_TABLET | Freq: Four times a day (QID) | ORAL | Status: DC | PRN

## 2015-07-24 MED ORDER — ATORVASTATIN CALCIUM 20 MG PO TABS
20.0000 mg | ORAL_TABLET | Freq: Every day | ORAL | Status: DC
  Administered 2015-07-24: 20 mg via ORAL
  Filled 2015-07-24 (×2): qty 1

## 2015-07-24 MED ORDER — VITAMIN B-12 1000 MCG PO TABS
1000.0000 ug | ORAL_TABLET | Freq: Every morning | ORAL | Status: DC
Start: 2015-07-25 — End: 2015-07-25
  Administered 2015-07-25: 1000 ug via ORAL
  Filled 2015-07-24: qty 1

## 2015-07-24 MED ORDER — PROCHLORPERAZINE MALEATE 10 MG PO TABS
10.0000 mg | ORAL_TABLET | Freq: Four times a day (QID) | ORAL | Status: DC | PRN
  Administered 2015-07-24: 10 mg via ORAL
  Filled 2015-07-24: qty 1

## 2015-07-24 MED ORDER — ALBUTEROL SULFATE (2.5 MG/3ML) 0.083% IN NEBU: 2.5000 mg | INHALATION_SOLUTION | Freq: Four times a day (QID) | RESPIRATORY_TRACT | Status: DC | PRN

## 2015-07-24 NOTE — H&P (Signed)
[facility] History and Physical  TONJA JEZEWSKI RSW:[id number] DOB: [birth year] DOA: 07/24/2015  Referring physician: Dr Venora Maples PCP: [name], NP   Chief Complaint:   throat congestion post recent left vocal cord medialization  thyroplasty  HPI:  59 year old female with history of recurrent non-small cell lung cancer currently on chemotherapy (follows with Dr. 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 [name] (ENT). Patient tolerated the procedure well and was placed back on Xarelto on 10/5. She however developed a small hematoma on the left neck this afternoon. She went to see Dr. [name] complaining of throat congestion and stridor on exam she did not have any stridor or wheezing. He performed a fiberoptic laryngoscopy in the office which showed clear upper airway with minimal left vocal cord edema, normal right vocal cord and no glottic compromise with stable airway. Patient was very anxious and he recommended her to go to the ED to have her admitted overnight for observation. ED physician spoke with Dr. [name] who recommended overnight monitoring for symptoms which appear to be mostly related to anxiety, 1 dose of IV Decadron and hold xarelto and he will see patient in the morning.  Patient reports having denies headache, dizziness, fever, chills, nausea , vomiting, chest pain, palpitations, SOB, abdominal pain, bowel or urinary symptoms.  Patient given a dose of IV Decadron 10 mg as per ENT recommendation and hospitalist admission requested on observation. Dr. [name] will see patient in the morning.  Review of Systems:  Constitutional: Denies fever, chills, diaphoresis, appetite change and fatigue.  HEENT: Hoarseness of voice,? Stridor, throat pain, Denies visual or hearing symptoms,  denies congestion,  difficulty swallowing , neck pain or stiffness Respiratory: Denies SOB, DOE, cough, chest tightness,  and wheezing.   Cardiovascular: Denies chest pain, palpitations and leg swelling.  Gastrointestinal: Denies nausea, vomiting, abdominal pain, diarrhea, constipation, blood in stool and abdominal distention.  Genitourinary: Denies dysuria, hematuria and flank pain Endocrine: Denies: hot or cold intolerance,  polyuria, polydipsia. Musculoskeletal: Denies myalgias, back pain, joint pain or stiffness Skin: Denies  rash and wound.  Neurological: Denies dizziness, seizures, syncope, weakness, light-headedness, numbness and headaches.  Hematological: Denies adenopathy.  Psychiatric/Behavioral: Denies confusion  Past Medical History  Diagnosis Date  . Anxiety   . Hyperlipidemia   . Hypothyroidism   . COPD (chronic obstructive pulmonary disease) ([place])   . Arthritis     thumbs  . 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 Lung cancer ([place]) dx'd 05/2009  . Lung cancer ([place]) dx'd 09/2013    recurrence in LN  . Diabetes mellitus   . Pulmonary embolism ([place])     'I've had a blood clot in my chest'   Past Surgical History  Procedure Laterality Date  . Thyroidectomy, partial    . Tonsillectomy    . Tubal ligation    . Partial hysterectomy    . Tonsillectomy    . Laryngoplasty Left 07/22/2015    Procedure: LEFT VOCAL CORD MEDIALIZATION;  Surgeon: Rozetta Nunnery, MD;  Location: [place];  Service: ENT;  Laterality: Left;   Social History:  reports that she quit smoking about 8 months ago. Her smoking use included Cigarettes. She has a 22.5 pack-year smoking history. She has never  used smokeless tobacco. She reports that she does not drink alcohol or use illicit drugs.  Allergies  Allergen Reactions  . Carboplatin Rash    Skin test reaction  . Sulfonamide Derivatives Rash   . Codeine Other (See Comments)    hallucinations  . Dilaudid [Hydromorphone Hcl] Nausea And Vomiting and Other (See Comments)    Room started spinning.  Pt has been okay with low doses and nausea meds administered first  . Keflex [Cephalexin] Itching  . Penicillins Other (See Comments)    Has patient had a PCN reaction causing immediate rash, facial/tongue/throat swelling, SOB or lightheadedness with hypotension: unknown Has patient had a PCN reaction causing severe rash involving mucus membranes or skin necrosis: unknown Has patient had a PCN reaction that required hospitalization unknown Has patient had a PCN reaction occurring within the last 10 years: no If all of the above answers are "NO", then may proceed with Cephalosporin use.   . Vicodin [Hydrocodone-Acetaminophen] Rash    Family History  Problem Relation Age of Onset  . Colon cancer Mother   . Cancer Mother     colon  . Diabetes type II Father     Prior to Admission medications   Medication Sig Start Date End Date Taking? Authorizing Provider  acetaminophen (TYLENOL) 325 MG tablet Take 650 mg by mouth every 6 (six) hours as needed for mild pain or headache.    Yes Historical Provider, MD  alendronate (FOSAMAX) 10 MG tablet Take 10 mg by mouth daily.  03/20/14  Yes Historical Provider, MD  atorvastatin (LIPITOR) 20 MG tablet Take 20 mg by mouth at bedtime. 06/17/15  Yes Historical Provider, MD  benzonatate (TESSALON) 200 MG capsule Take 1 capsule (200 mg total) by mouth 3 (three) times daily as needed for cough. 05/23/15  Yes Tammy S Parrett, NP  budesonide-formoterol (SYMBICORT) 160-4.5 MCG/ACT inhaler Inhale 2 puffs into the lungs 2 (two) times daily.   Yes Historical Provider, MD  cholecalciferol (VITAMIN D) 1000 UNITS tablet Take 1,000 Units by mouth every morning.    Yes Historical Provider, MD  [employer] ASPIRIN ADULT LOW DOSE 81 MG chewable tablet Chew 81 mg by mouth daily. 12/06/14  Yes Historical Provider, MD  cyanocobalamin  1000 MCG tablet Take 1,000 mcg by mouth every morning.    Yes Historical Provider, MD  diphenhydrAMINE (SOMINEX) 25 MG tablet Take 50 mg by mouth at bedtime as needed for itching or sleep.   Yes Historical Provider, MD  glimepiride (AMARYL) 2 MG tablet Take 2 tablets (4 mg total) by mouth daily with breakfast. Take higher dose (69mby mouth) while on prednisone. Once off prednisone go back to home regimen of 2 mg by mouth daily. Patient taking differently: Take 2 mg by mouth daily with breakfast.  04/26/15  Yes OVelvet Bathe MD  guaiFENesin (MUCINEX) 600 MG 12 hr tablet Take 600 mg by mouth 2 (two) times daily.    Yes Historical Provider, MD  levothyroxine (SYNTHROID, LEVOTHROID) 137 MCG tablet Take 137 mcg by mouth daily.  01/15/15  Yes Historical Provider, MD  LORazepam (ATIVAN) 1 MG tablet Take 1 mg by mouth 3 (three) times daily as needed for anxiety.    Yes Historical Provider, MD  magnesium gluconate (MAGONATE) 500 MG tablet Take 500 mg by mouth daily.    Yes Historical Provider, MD  metFORMIN (GLUCOPHAGE) 1000 MG tablet Take 1,000 mg by mouth 2 (two) times daily with a meal.   Yes Historical Provider, MD  Multiple Vitamin (MULTIVITAMIN  WITH MINERALS) TABS tablet Take 1 tablet by mouth every morning.   Yes Historical Provider, MD  oxyCODONE (OXY IR/ROXICODONE) 5 MG immediate release tablet Take 1 tablet (5 mg total) by mouth every 4 (four) hours as needed for severe pain. 07/21/15  Yes Curt Bears, MD  PROAIR HFA 108 (90 BASE) MCG/ACT inhaler INHALE 2 PUFFS INTO THE LUNGS EVERY 6 HOURS AS NEEDED FOR WHEEZING OR SHORTNESS OF BREATH. 06/17/15  Yes Brand Males, MD  prochlorperazine (COMPAZINE) 10 MG tablet Take 1 tablet (10 mg total) by mouth every 6 (six) hours as needed for nausea or vomiting. 06/25/15  Yes Curt Bears, MD  rivaroxaban (XARELTO) 20 MG TABS tablet Take 1 tablet (20 mg total) by mouth daily with supper. 07/07/15  Yes Curt Bears, MD  sertraline (ZOLOFT) 100 MG tablet Take  150 mg by mouth at bedtime.    Yes Historical Provider, MD  spironolactone (ALDACTONE) 25 MG tablet Take 25 mg by mouth every morning. 05/27/15  Yes Historical Provider, MD  tiotropium (SPIRIVA) 18 MCG inhalation capsule Place 1 capsule (18 mcg total) into inhaler and inhale daily. 04/26/15  Yes Velvet Bathe, MD  vitamin C (ASCORBIC ACID) 500 MG tablet Take 500 mg by mouth 2 (two) times daily.   Yes Historical Provider, MD  cephALEXin (KEFLEX) 500 MG capsule Take 1 capsule (500 mg total) by mouth 2 (two) times daily. Patient not taking: Reported on 07/24/2015 07/22/15   Rozetta Nunnery, MD  HYDROcodone-acetaminophen (NORCO/VICODIN) 5-325 MG tablet Take 1-2 tablets by mouth every 6 (six) hours as needed for moderate pain. Patient not taking: Reported on 07/24/2015 07/22/15   Rozetta Nunnery, MD     Physical Exam:  Filed Vitals:   07/24/15 1534 07/24/15 1705  BP: 138/67 122/79  Pulse: 126 113  Temp: 97.7 F (36.5 C)   TempSrc: Oral   Resp: 18 18  SpO2: 96% 94%    Constitutional: Vital signs reviewed.  Middle aged female in no acute distress HEENT: Hoarse voice, hematoma over left neck which is soft to palpation and nontender no pallor, no icterus, moist oral mucosa, no cervical lymphadenopathy, posterior pharyngeal wall appears normal. Cardiovascular: RRR, S1 normal, S2 normal, no MRG Chest: CTAB, no wheezes, rales, or rhonchi Abdominal: Soft. Non-tender, non-distended, bowel sounds are normal,  Ext: warm, no edema Neurological: A&O x3, non focal  Labs on Admission:  Basic Metabolic Panel:  Recent Labs Lab 07/24/15 1327  NA 139  K 4.3  CO2 27  GLUCOSE 360*  BUN 6.7*  CREATININE 0.8  CALCIUM 8.1*   Liver Function Tests:  Recent Labs Lab 07/24/15 1327  AST 18  ALT 11  ALKPHOS 89  BILITOT <0.30  PROT 6.3*  ALBUMIN 2.9*   No results for input(s): LIPASE, AMYLASE in the last 168 hours. No results for input(s): AMMONIA in the last 168 hours. CBC:  Recent  Labs Lab 07/24/15 1327  WBC 6.5  NEUTROABS 5.1  HGB 10.0*  HCT 32.4*  MCV 85.7  PLT 207   Cardiac Enzymes: No results for input(s): CKTOTAL, CKMB, CKMBINDEX, TROPONINI in the last 168 hours. BNP: Invalid input(s): POCBNP CBG:  Recent Labs Lab 07/22/15 0652 07/22/15 0933  GLUCAP 168* 169*    Radiological Exams on Admission: No results found.  EKG:   Assessment/Plan Principal Problem:   Vocal cords swelling,  postoperative Admit on observation. Patient examined by ENT with fiberoptic laryngoscopy which showed clear upper airway with minimal left vocal code edema (following vocal cord medialization  thyroplasty on 10/4) with normal right vocal cord and no glottic compromise. -Patient does not have any stridor or wheezing. She has some hoarseness of voice which seems to be chronic. She has also developed a small hematoma over the left neck which is soft on exam and nontender.  admit under observation overnight. Patient received a dose of IV Decadron 10 mg in the ED as per ENT.  recommendation. Monitor airway closely. Will add when necessary nebs as needed. Patient currently maintaining O2 sat on room air. She was tachycardic on presentation was likely due to anxiety and has now resolved. -Dr. [name] will see patient in the morning.  Active Problems:   DM type 2 (diabetes mellitus, type 2) (HCC) Continue Amaryl. Hold metformin. Monitor on sliding scale insulin.    Sinus tachycardia ([place]) Likely associated with anxiety. Resume home anxiolytics. Continue to monitor.    COPD, moderate ([place]) Stable. Will place on when necessary nebs. Continue home inhaler    Extensive DVT (deep venous thrombosis) (HCC) of left upper extremity, status post thrombectomy On Xarelto which is on hold due to mild neck hematoma.  Recurrent non-small cell lung cancer, squamous cell carcinoma diagnosed in 2010 Multiple systemic chemotherapy, chemoradiation. Currently on systemic chemotherapy with  gemcitabine. Follows with Dr. [name].    Diabetes mellitus FSE elevated. Hold metformin. Continue Amaryl. Monitor on sliding scale insulin.  Diet: Diabetic  DVT prophylaxis: SCD   Code Status: full code Family Communication:  None at bedside Disposition Plan: Monitor for overnight observation.  Louellen Molder [facility] Pager [phone]  Total time spent on admission :45 minutes  If 7PM-7AM, please contact night-coverage [email or web address] Password [email or web address] 07/24/2015, 6:08 PM

## 2015-07-24 NOTE — Consult Note (Addendum)
Patient presents to office 2 days s/p surgery for left vocal cord paralysis. Patient underwent a open left vocal cord medialization thyroplasty on Tues. She restarted her Xarelto last night and developed a small hematoma of the left neck this pm. She complains of congestion in her throat but has no stridor with good O2 sats   On Exam in office No stridor noted. Good voice Mild hematoma left neck but soft to palpation FOL revealed upper airway clear         Minimal left vocal cord edema          No glottic compromise         Normal right vocal cord  Stable airway Consider 24 hr observation in hospital setting Elevate HOB and cool compress to neck Hold all blood thinners for 48 hrs Will follow up with patient in am

## 2015-07-24 NOTE — Progress Notes (Signed)
Fosamax is held during admission for risk of esophageal irritation per P&T recommendations.  Thank you  Minda Ditto PharmD Pager [phone] 07/24/2015, 6:44 PM

## 2015-07-24 NOTE — ED Provider Notes (Signed)
CSN: [id number]     Arrival date & time 07/24/15  1521 History   First MD Initiated Contact with Patient 07/24/15 1613     Chief Complaint  Patient presents with  . Oral Swelling  . Post-op Problem     HPI Pt underwent left vocal cord medialization surgery by Dr Radene Journey on 07/22/2015. Pt reports feeling fullness and bruising in the anterior neck. Was seen by Dr [name] in the office just PTA and Dr [name] reports very mild swelling around her vocal cord but no airway compromise at all. Pt had started her xarelto back yesterday. Dr [name] requests admission to hospitalist service, IV decadron and he will reevaluate in the morning. Dr [name] reports the pt is slightly anxious and apprehensive which is why he stated she could be observed overnight in the hospital. No cough or congestion. No fever.    Past Medical History  Diagnosis Date  . Anxiety   . Hyperlipidemia   . Hypothyroidism   . COPD (chronic obstructive pulmonary disease) ([place])   . Arthritis     thumbs  . 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 Lung cancer ([place]) dx'd 05/2009  . Lung cancer ([place]) dx'd 09/2013    recurrence in LN  . Diabetes mellitus   . Pulmonary embolism ([place])     'I've had a blood clot in my chest'   Past Surgical History  Procedure Laterality Date  . Thyroidectomy, partial    . Tonsillectomy    . Tubal ligation    . Partial hysterectomy    . Tonsillectomy    . Laryngoplasty Left 07/22/2015    Procedure: LEFT VOCAL CORD MEDIALIZATION;  Surgeon: Rozetta Nunnery, MD;  Location: [facility];  Service: ENT;  Laterality: Left;   Family History  Problem Relation Age of Onset  . Colon cancer Mother   . Cancer Mother     colon  . Diabetes type II Father    Social History  Substance Use Topics  . Smoking status: Former Smoker -- 0.50 packs/day for 45 years    Types: Cigarettes   Quit date: 11/03/2014  . Smokeless tobacco: Never Used     Comment: pt. started back smoking 10/18/2012, half a pack a day  . Alcohol Use: No   OB History    No data available     Review of Systems  All other systems reviewed and are negative.     Allergies  Carboplatin; Sulfonamide derivatives; Codeine; Dilaudid; Keflex; Penicillins; and Vicodin  Home Medications   Prior to Admission medications   Medication Sig Start Date End Date Taking? Authorizing Provider  acetaminophen (TYLENOL) 325 MG tablet Take 650 mg by mouth every 6 (six) hours as needed for mild pain or headache.    Yes Historical Provider, MD  alendronate (FOSAMAX) 10 MG tablet Take 10 mg by mouth daily.  03/20/14  Yes Historical Provider, MD  atorvastatin (LIPITOR) 20 MG tablet Take 20 mg by mouth at bedtime. 06/17/15  Yes Historical Provider, MD  benzonatate (TESSALON) 200 MG capsule Take 1 capsule (200 mg total) by mouth 3 (three) times daily as needed for cough. 05/23/15  Yes Tammy S Parrett, NP  budesonide-formoterol (SYMBICORT) 160-4.5 MCG/ACT inhaler Inhale 2 puffs into the lungs 2 (two) times daily.   Yes Historical Provider, MD  cholecalciferol (VITAMIN D) 1000 UNITS tablet Take 1,000 Units by mouth  every morning.    Yes Historical Provider, MD  [employer] ASPIRIN ADULT LOW DOSE 81 MG chewable tablet Chew 81 mg by mouth daily. 12/06/14  Yes Historical Provider, MD  cyanocobalamin 1000 MCG tablet Take 1,000 mcg by mouth every morning.    Yes Historical Provider, MD  diphenhydrAMINE (SOMINEX) 25 MG tablet Take 50 mg by mouth at bedtime as needed for itching or sleep.   Yes Historical Provider, MD  glimepiride (AMARYL) 2 MG tablet Take 2 tablets (4 mg total) by mouth daily with breakfast. Take higher dose (58mby mouth) while on prednisone. Once off prednisone go back to home regimen of 2 mg by mouth daily. Patient taking differently: Take 2 mg by mouth daily with breakfast.  04/26/15  Yes OVelvet Bathe MD  guaiFENesin (MUCINEX)  600 MG 12 hr tablet Take 600 mg by mouth 2 (two) times daily.    Yes Historical Provider, MD  levothyroxine (SYNTHROID, LEVOTHROID) 137 MCG tablet Take 137 mcg by mouth daily.  01/15/15  Yes Historical Provider, MD  LORazepam (ATIVAN) 1 MG tablet Take 1 mg by mouth 3 (three) times daily as needed for anxiety.    Yes Historical Provider, MD  magnesium gluconate (MAGONATE) 500 MG tablet Take 500 mg by mouth daily.    Yes Historical Provider, MD  metFORMIN (GLUCOPHAGE) 1000 MG tablet Take 1,000 mg by mouth 2 (two) times daily with a meal.   Yes Historical Provider, MD  Multiple Vitamin (MULTIVITAMIN WITH MINERALS) TABS tablet Take 1 tablet by mouth every morning.   Yes Historical Provider, MD  oxyCODONE (OXY IR/ROXICODONE) 5 MG immediate release tablet Take 1 tablet (5 mg total) by mouth every 4 (four) hours as needed for severe pain. 07/21/15  Yes MCurt Bears MD  PROAIR HFA 108 (90 BASE) MCG/ACT inhaler INHALE 2 PUFFS INTO THE LUNGS EVERY 6 HOURS AS NEEDED FOR WHEEZING OR SHORTNESS OF BREATH. 06/17/15  Yes MBrand Males MD  prochlorperazine (COMPAZINE) 10 MG tablet Take 1 tablet (10 mg total) by mouth every 6 (six) hours as needed for nausea or vomiting. 06/25/15  Yes MCurt Bears MD  rivaroxaban (XARELTO) 20 MG TABS tablet Take 1 tablet (20 mg total) by mouth daily with supper. 07/07/15  Yes MCurt Bears MD  sertraline (ZOLOFT) 100 MG tablet Take 150 mg by mouth at bedtime.    Yes Historical Provider, MD  spironolactone (ALDACTONE) 25 MG tablet Take 25 mg by mouth every morning. 05/27/15  Yes Historical Provider, MD  tiotropium (SPIRIVA) 18 MCG inhalation capsule Place 1 capsule (18 mcg total) into inhaler and inhale daily. 04/26/15  Yes OVelvet Bathe MD  vitamin C (ASCORBIC ACID) 500 MG tablet Take 500 mg by mouth 2 (two) times daily.   Yes Historical Provider, MD  cephALEXin (KEFLEX) 500 MG capsule Take 1 capsule (500 mg total) by mouth 2 (two) times daily. Patient not taking: Reported on  07/24/2015 07/22/15   CRozetta Nunnery MD  HYDROcodone-acetaminophen (NORCO/VICODIN) 5-325 MG tablet Take 1-2 tablets by mouth every 6 (six) hours as needed for moderate pain. Patient not taking: Reported on 07/24/2015 07/22/15   CRozetta Nunnery MD   BP 138/67 mmHg  Pulse 126  Temp(Src) 97.7 F (36.5 C) (Oral)  Resp 18  SpO2 96% Physical Exam  Constitutional: She is oriented to person, place, and time. She appears well-developed and well-nourished.  HENT:  Head: Normocephalic.  Oral airway patent  Eyes: EOM are normal.  Neck: Normal range of motion. Neck supple. No thyromegaly present.  Small bruising of anterior neck without significant swelling. No stridor present.   Pulmonary/Chest: Effort normal.  Abdominal: She exhibits no distension.  Musculoskeletal: Normal range of motion.  Neurological: She is alert and oriented to person, place, and time.  Psychiatric: She has a normal mood and affect.  Nursing note and vitals reviewed.   ED Course  Procedures (including critical care time) Labs Review Labs Reviewed - No data to display  Imaging Review No results found. I have personally reviewed and evaluated these images and lab results as part of my medical decision-making.   EKG Interpretation None      MDM   Final diagnoses:  None   IV decadron. hospitalist admission. Dr [name] to put airway documentation and recommendations in the chart. NO ANTICOAGULANTS    Jola Schmidt, MD 07/24/15 [phone]

## 2015-07-24 NOTE — ED Notes (Addendum)
Pt states she had vocal cord surgery on Tuesday, states she started taking her xarelto and baby aspirin last night which was too soon. Now she's having swelling and bruising to the surgical site at the base of her neck. States she feels like there is a moderate amount of swelling to her throat that makes it feel "a little tight" to breathe. But she states she does not feel SOB. Obvious bruising to lower throat, O2 sat 99% on RA. Pt states she is chronically tachycardic

## 2015-07-25 DIAGNOSIS — I82629 Acute embolism and thrombosis of deep veins of unspecified upper extremity: Secondary | ICD-10-CM | POA: Diagnosis not present

## 2015-07-25 DIAGNOSIS — J383 Other diseases of vocal cords: Secondary | ICD-10-CM | POA: Diagnosis not present

## 2015-07-25 DIAGNOSIS — J3801 Paralysis of vocal cords and larynx, unilateral: Secondary | ICD-10-CM | POA: Diagnosis not present

## 2015-07-25 DIAGNOSIS — R000 Tachycardia, unspecified: Secondary | ICD-10-CM | POA: Diagnosis not present

## 2015-07-25 LAB — GLUCOSE, CAPILLARY: Glucose-Capillary: 183 mg/dL — ABNORMAL HIGH (ref 65–99)

## 2015-07-25 NOTE — Progress Notes (Signed)
Discharge instructions reviewed with pt, all questions answered and pt verbalized understanding. AVS paper given to patient.

## 2015-07-25 NOTE — Consult Note (Signed)
Patient presents to office 2 days s/p surgery for left vocal cord paralysis. Patient underwent a open left vocal cord medialization thyroplasty on Tues. She restarted her Xarelto last night and developed a small hematoma of the left neck this pm. She complains of congestion in her throat but has no stridor with good O2 sats   On Exam in office No stridor noted. Good voice Mild hematoma left neck but soft to palpation FOL revealed upper airway clear         Minimal left vocal cord edema          No glottic compromise         Normal right vocal cord  Stable airway Consider 24 hr observation in hospital setting Elevate HOB and cool compress to neck Hold all blood thinners for 48 hrs Will follow up with patient in am

## 2015-07-25 NOTE — Progress Notes (Signed)
Follow up note Patient admitted yesterday pm because of airway discomfort following vocal cord mediltizatin procedure on 10/4 Feeling better this am No progression of neck hematoma. Soft to palpation. No airway compromise. No stridor. Stable  Can discharge this am Hold blood thinners for another 24 hrs Patient will follow up my office next week.

## 2015-07-25 NOTE — Discharge Summary (Signed)
Physician Discharge Summary  Janice Ball ALP:[id number] DOB: 11/14/1955 DOA: 07/24/2015  PCP: [name], NP  Admit date: 07/24/2015 Discharge date: 07/25/2015  Time spent: 25 minutes  Recommendations for Outpatient Follow-up:  1. Discharge home with outpatient follow-up.  Discharge Diagnoses:  Principal Problem:   Vocal cords swelling   Active Problems:   Primary cancer of right upper lobe of lung (HCC)   DM type 2 (diabetes mellitus, type 2) (HCC)   Sinus tachycardia (HCC)   COPD, moderate (HCC)   DVT (deep venous thrombosis) (HCC)   COPD (chronic obstructive pulmonary disease) (HCC)   Unilateral vocal cord paralysis   Vocal cord edema   Discharge Condition: Fair  Diet recommendation: Diabetic  Filed Weights   07/24/15 1918  Weight: 72.122 kg (159 lb)    History of present illness:  59 year old female with history of recurrent non-small cell lung cancer currently on chemotherapy (follows with Dr. 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 [name] (ENT). Patient tolerated the procedure well and was placed back on Xarelto on 10/5. She however developed a small hematoma on the left neck this afternoon. She went to see Dr. [name] complaining of throat congestion and stridor on exam she did not have any stridor or wheezing. He performed a fiberoptic laryngoscopy in the office which showed clear upper airway with minimal left vocal cord edema, normal right vocal cord and no glottic compromise with stable airway. Patient was very anxious and he recommended her to go to the ED to have her admitted overnight for observation. ED physician spoke with Dr. [name] who recommended overnight monitoring for symptoms which appeared  to be mostly related to anxiety, give 1 dose of IV Decadron and hold  xarelto. Patient  denies headache, dizziness, fever, chills, nausea , vomiting, chest pain, palpitations, SOB, abdominal pain, bowel or urinary symptoms.   Hospital Course:  Principal Problem:  Vocal cords swelling, postoperative Patient examined by ENT with fiberoptic laryngoscopy in the office which showed clear upper airway with minimal left vocal code edema (following vocal cord medialization thyroplasty on 10/4) with normal right vocal cord and no glottic compromise. -Patient does not have any stridor or wheezing. She has some hoarseness of voice which seems to be chronic. She has also developed a small hematoma over the left neck which is soft on exam and nontender.  Patient admitted under observation overnight. Patient received a dose of IV Decadron 10 mg in the ED as per ENT.   -No overnight issues and patient reports that her throat congestion has resolved. Denies any shortness of breath or stridor. -Patient seen by Dr. [name] this morning and no further recommendations. She is stable to be discharged home. He has follow-up with him on Monday (10/10).   Active Problems:  DM type 2 (diabetes mellitus, type 2) ([place]) Fingersticks elevated after receiving Decadron in the ED. Stable this morning. Resume home oral hypoglycemics   Sinus tachycardia (HCC) Likely associated with anxiety. Home anxiolytics resumed. Stable.   COPD, moderate ([place]) Stable. Continue home meds.   Extensive DVT (deep venous thrombosis) (HCC) of left upper extremity, status post thrombectomy Xarelto health due to mild neck hematoma. Can be resumed upon discharge.  Recurrent non-small cell lung cancer, squamous cell carcinoma diagnosed in 2010 Multiple systemic chemotherapy, chemoradiation. Currently on systemic chemotherapy with gemcitabine. Follows with Dr. [name].  Diabetes mellitus FSE elevated. Hold metformin. Continue Amaryl. Monitor on sliding scale insulin.     Code Status: full  code Family Communication: None at bedside Disposition Plan: Discharge home with outpatient follow-up   Discharge Exam: Filed Vitals:   07/25/15 0550  BP: 133/67  Pulse: 108  Temp: 97.8 F (36.6 C)  Resp: 18    Constitutional: Vit no acute distress HEENT: Hoarse voice, hematoma over left neck which is soft to palpation and nontender  moist oral mucosa, no cervical lymphadenopathy, posterior pharyngeal wall appears normal. Cardiovascular: Normal S1 and S2 Chest: CTAB, no wheezes, rales, or rhonchi Abdominal: Soft. Non-tender, non-distended,  Ext: warm, no edema  Discharge Instructions    Current Discharge Medication List    CONTINUE these medications which have NOT CHANGED   Details  acetaminophen (TYLENOL) 325 MG tablet Take 650 mg by mouth every 6 (six) hours as needed for mild pain or headache.     alendronate (FOSAMAX) 10 MG tablet Take 10 mg by mouth daily.    Associated Diagnoses: Malignant neoplasm of bronchus and lung, unspecified site    atorvastatin (LIPITOR) 20 MG tablet Take 20 mg by mouth at bedtime. Refills: 4    benzonatate (TESSALON) 200 MG capsule Take 1 capsule (200 mg total) by mouth 3 (three) times daily as needed for cough. Qty: 30 capsule, Refills: 1    budesonide-formoterol (SYMBICORT) 160-4.5 MCG/ACT inhaler Inhale 2 puffs into the lungs 2 (two) times daily.    cholecalciferol (VITAMIN D) 1000 UNITS tablet Take 1,000 Units by mouth every morning.     [employer] ASPIRIN ADULT LOW DOSE 81 MG chewable tablet Chew 81 mg by mouth daily. Refills: 0    cyanocobalamin 1000 MCG tablet Take 1,000 mcg by mouth every morning.     diphenhydrAMINE (SOMINEX) 25 MG tablet Take 50 mg by mouth at bedtime as needed for itching or sleep.    glimepiride (AMARYL) 2 MG tablet Take 2 tablets (4 mg total) by mouth daily with breakfast. Take higher dose (25mby mouth) while on prednisone. Once off prednisone go back to home regimen of 2 mg by mouth daily. Qty: 30 tablet,  Refills: 0    guaiFENesin (MUCINEX) 600 MG 12 hr tablet Take 600 mg by mouth 2 (two) times daily.     levothyroxine (SYNTHROID, LEVOTHROID) 137 MCG tablet Take 137 mcg by mouth daily.     LORazepam (ATIVAN) 1 MG tablet Take 1 mg by mouth 3 (three) times daily as needed for anxiety.     magnesium gluconate (MAGONATE) 500 MG tablet Take 500 mg by mouth daily.     metFORMIN (GLUCOPHAGE) 1000 MG tablet Take 1,000 mg by mouth 2 (two) times daily with a meal.    Multiple Vitamin (MULTIVITAMIN WITH MINERALS) TABS tablet Take 1 tablet by mouth every morning.    oxyCODONE (OXY IR/ROXICODONE) 5 MG immediate release tablet Take 1 tablet (5 mg total) by mouth every 4 (four) hours as needed for severe pain. Qty: 60 tablet, Refills: 0   Associated Diagnoses: Primary cancer of right upper lobe of lung (HCC)    PROAIR HFA 108 (90 BASE) MCG/ACT inhaler INHALE 2 PUFFS INTO THE LUNGS EVERY 6 HOURS AS NEEDED FOR WHEEZING OR SHORTNESS OF BREATH. Qty: 1 Inhaler, Refills: 2    prochlorperazine (COMPAZINE) 10 MG tablet Take 1 tablet (10 mg total) by mouth every 6 (six) hours as needed for nausea or vomiting. Qty: 30 tablet, Refills: 1    rivaroxaban (XARELTO) 20 MG TABS  tablet Take 1 tablet (20 mg total) by mouth daily with supper. Qty: 30 tablet, Refills: 3   Associated Diagnoses: Primary cancer of right upper lobe of lung ([place]); SVC syndrome; DVT (deep venous thrombosis), unspecified laterality    sertraline (ZOLOFT) 100 MG tablet Take 150 mg by mouth at bedtime.     spironolactone (ALDACTONE) 25 MG tablet Take 25 mg by mouth every morning. Refills: 4    tiotropium (SPIRIVA) 18 MCG inhalation capsule Place 1 capsule (18 mcg total) into inhaler and inhale daily. Qty: 30 capsule, Refills: 12    vitamin C (ASCORBIC ACID) 500 MG tablet Take 500 mg by mouth 2 (two) times daily.      STOP taking these medications     cephALEXin (KEFLEX) 500 MG capsule      HYDROcodone-acetaminophen (NORCO/VICODIN)  5-325 MG tablet        Allergies  Allergen Reactions  . Carboplatin Rash    Skin test reaction  . Sulfonamide Derivatives Rash  . Codeine Other (See Comments)    hallucinations  . Dilaudid [Hydromorphone Hcl] Nausea And Vomiting and Other (See Comments)    Room started spinning.  Pt has been okay with low doses and nausea meds administered first  . Keflex [Cephalexin] Itching  . Penicillins Other (See Comments)    Has patient had a PCN reaction causing immediate rash, facial/tongue/throat swelling, SOB or lightheadedness with hypotension: unknown Has patient had a PCN reaction causing severe rash involving mucus membranes or skin necrosis: unknown Has patient had a PCN reaction that required hospitalization unknown Has patient had a PCN reaction occurring within the last 10 years: no If all of the above answers are "NO", then may proceed with Cephalosporin use.   . Vicodin [Hydrocodone-Acetaminophen] Rash   Follow-up Information    Follow up with Melony Overly, MD On 07/28/2015.   Specialty:  Otolaryngology   Contact information:   [place] [address] [phone]        The results of significant diagnostics from this hospitalization (including imaging, microbiology, ancillary and laboratory) are listed below for reference.    Significant Diagnostic Studies: No results found.  Microbiology: No results found for this or any previous visit (from the past 240 hour(s)).   Labs: Basic Metabolic Panel:  Recent Labs Lab 07/24/15 1327  NA 139  K 4.3  CO2 27  GLUCOSE 360*  BUN 6.7*  CREATININE 0.8  CALCIUM 8.1*   Liver Function Tests:  Recent Labs Lab 07/24/15 1327  AST 18  ALT 11  ALKPHOS 89  BILITOT <0.30  PROT 6.3*  ALBUMIN 2.9*   No results for input(s): LIPASE, AMYLASE in the last 168 hours. No results for input(s): AMMONIA in the last 168 hours. CBC:  Recent Labs Lab 07/24/15 1327  WBC 6.5  NEUTROABS 5.1  HGB  10.0*  HCT 32.4*  MCV 85.7  PLT 207   Cardiac Enzymes: No results for input(s): CKTOTAL, CKMB, CKMBINDEX, TROPONINI in the last 168 hours. BNP: BNP (last 3 results)  Recent Labs  11/03/14 1222 11/30/14 1450 04/20/15 0340  BNP 22.1 29.9 32.5    ProBNP (last 3 results) No results for input(s): PROBNP in the last 8760 hours.  CBG:  Recent Labs Lab 07/22/15 0652 07/22/15 0933 07/24/15 2231 07/25/15 0724  GLUCAP 168* 169* 270* 183*       Signed:  Adysson Revelle  [facility] 07/25/2015, 9:48 AM

## 2015-07-25 NOTE — Progress Notes (Addendum)
Follow up note Patient admitted yesterday pm because of airway discomfort following vocal cord mediltizatin procedure on 10/4 Feeling better this am No progression of neck hematoma. Soft to palpation. No airway compromise. No stridor. Stable  Can discharge this am Hold blood thinners for another 24 hrs Patient will follow up my office next week.

## 2015-07-26 ENCOUNTER — Encounter: Payer: Self-pay | Admitting: Nurse Practitioner

## 2015-07-26 DIAGNOSIS — Z7901 Long term (current) use of anticoagulants: Secondary | ICD-10-CM | POA: Insufficient documentation

## 2015-07-26 NOTE — Assessment & Plan Note (Signed)
Pt with hx of DVT in the past. Pt has been undergoing a Lovenox bridge; and re-started the Xarelto today as well. Dr. [name] has instructed pt to hold all anticoagulants until Saturday 07/26/15.

## 2015-07-26 NOTE — Assessment & Plan Note (Signed)
Patient received her last cycle of gemcitabine chemotherapy on 07/10/2015.  She underwent a vocal cord medialization surgery this past Tuesday, 07/22/2015 for vocal cord paralysis per Dr. [name] ENT.  Patient is scheduled to return on 07/31/2015 for labs, visit, and her next cycle of chemotherapy.

## 2015-07-26 NOTE — Assessment & Plan Note (Addendum)
Patient received her last cycle of gemcitabine chemotherapy on 07/10/2015.  She underwent a vocal cord medialization surgery this past Tuesday, 07/22/2015 for vocal cord paralysis per Dr. [name] ENT.  She presented to the [facility] today with complaint of mild shortness of breath and increased swelling of her throat following her recent surgery.  Patient denies any chest pain, chest pressure, or pain with inspiration.  She denies having any issues, managing her oral secretions.  She also denies any swelling of her tongue.  On exam.-It does appear the patient has an extended mild hematoma below her anterior throat incision site.  There is some mild edema to the site; but no erythema, warmth, drainage, or tenderness.  There is no evidence of infection.  Patient appears to be managing her airway and her oral secretions fairly well.  O2 sat was 96% on room air.  Patient reports that she continues with her Lovenox bridge at; and restarted her Xarelto again this morning as well.  Called and reviewed all these findings with Dr. [name] ENT; and he advised the patient should come to his office for further evaluation and management.  Receives call back from Dr. [name].  Following evaluation of the patient; and he advised the patient was in no acute distress.  However, he did recommend that patient presented to the [facility] emergency department for planned admission for 24-hour observation to make sure that the hematoma does not progress and cause further difficulties. He also advised that pt hold all anticoagulation medications until Saturday 07/26/15. Per Dr. Wyman Songster presented to the emergency department for further evaluation and management; and planned admission for 24-hour observation only.  Patient is scheduled to return on 07/31/2015 for labs, visit, and her next cycle of chemotherapy.

## 2015-07-26 NOTE — Progress Notes (Signed)
SYMPTOM MANAGEMENT CLINIC   HPI: Janice Ball 59 y.o. female diagnosed with lung cancer.  Currently undergoing gemcitabine chemotherapy regimen.  Patient is currently recovering from vocal cord medialization surgery for vocal cord paralysis.   Patient received her last cycle of gemcitabine chemotherapy on 07/10/2015.  She underwent a vocal cord medialization surgery this past Tuesday, 07/22/2015 for vocal cord paralysis per Dr. [name] ENT.  She presented to the [facility] today with complaint of mild shortness of breath and increased swelling of her throat following her recent surgery.  Patient denies any chest pain, chest pressure, or pain with inspiration.  She denies having any issues, managing her oral secretions.  She also denies any swelling of her tongue.  On exam.-It does appear the patient has an extended mild hematoma below her anterior throat incision site.  There is some mild edema to the site; but no erythema, warmth, drainage, or tenderness.  There is no evidence of infection.  Patient appears to be managing her airway and her oral secretions fairly well.  O2 sat was 96% on room air.  Patient reports that she continues with her Lovenox bridge at; and restarted her Xarelto again this morning as well.  Called and reviewed all these findings with Dr. [name] ENT; and he advised the patient should come to his office for further evaluation and management.  Receives call back from Dr. [name].  Following evaluation of the patient; and he advised the patient was in no acute distress.  However, he did recommend that patient presented to the [facility] emergency department for planned admission for 24-hour observation to make sure that the hematoma does not progress and cause further difficulties.  Per Dr. Frederich Chick presented to the emergency department for further evaluation and management; and planned admission for 24-hour observation only.  Patient is scheduled to return on  07/31/2015 for labs, visit, and her next cycle of chemotherapy.  HPI  ROS  Past Medical History  Diagnosis Date  . Anxiety   . Hyperlipidemia   . Hypothyroidism   . COPD (chronic obstructive pulmonary disease) ([place])   . Arthritis     thumbs  . 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 Lung cancer ([place]) dx'd 05/2009  . Lung cancer ([place]) dx'd 09/2013    recurrence in LN  . Diabetes mellitus   . Pulmonary embolism ([place])     'I've had a blood clot in my chest'    Past Surgical History  Procedure Laterality Date  . Thyroidectomy, partial    . Tonsillectomy    . Tubal ligation    . Partial hysterectomy    . Tonsillectomy    . Laryngoplasty Left 07/22/2015    Procedure: LEFT VOCAL CORD MEDIALIZATION;  Surgeon: Rozetta Nunnery, MD;  Location: [facility];  Service: ENT;  Laterality: Left;    has Primary cancer of right upper lobe of lung ([place]); Hypothyroidism; DM type 2 (diabetes mellitus, type 2) ([place]); HYPERLIPIDEMIA; ANXIETY; COPD with acute exacerbation ([place]); OSTEOPOROSIS; Nonspecific (abnormal) findings on radiological and other examination of body structure; COPD exacerbation ([place]); Smoking; Fatigue; Hoarseness of voice; Nasal drainage; CAP (community acquired pneumonia); Sinus tachycardia ([place]); Lactic acidosis; Febrile illness, acute; Dehydration; SIRS (systemic inflammatory response syndrome) ([place]); Need for prophylactic vaccination against Streptococcus pneumoniae (pneumococcus); COPD, moderate ([place]); Chest pain; SVC syndrome; Hemoptysis; Pancytopenia due to antineoplastic chemotherapy ([place]); Cellulitis; Sepsis ([place]);  Lung cancer ([place]); Acute exacerbation of chronic obstructive pulmonary disease (COPD) ([place]); DVT (deep venous thrombosis) ([place]); Facial swelling; Vocal cords swelling; COPD (chronic obstructive pulmonary disease) ([place]); Unilateral vocal cord  paralysis; Vocal cord edema; and Long term current use of anticoagulant therapy on her problem list.    is allergic to carboplatin; sulfonamide derivatives; codeine; dilaudid; keflex; penicillins; and vicodin.    Medication List    Notice    This visit is during an admission. Changes to the med list made in this visit will be reflected in the After Visit Summary of the admission.       PHYSICAL EXAMINATION  Oncology Vitals 07/25/2015 07/25/2015 07/24/2015 07/24/2015 07/24/2015 07/24/2015 07/24/2015  Height - - - - 170 cm - -  Weight - - - - 72.122 kg - -  Weight (lbs) - - - - 159 lbs - -  BMI (kg/m2) - - - - 24.9 kg/m2 - -  Temp - 97.8 98.4 - 98.3 - 97.7  Pulse - 108 113 - 116 113 126  Resp - 18 20 - 20 18 18   Resp (Historical as of 05/18/12) - - - - - - -  SpO2 96 93 94 94 94 94 96  BSA (m2) - - - - 1.85 m2 - -   BP Readings from Last 3 Encounters:  07/25/15 133/67  07/24/15 156/76  07/22/15 144/60    Physical Exam  Constitutional: She is oriented to person, place, and time and well-developed, well-nourished, and in no distress.  HENT:  Head: Normocephalic and atraumatic.  On exam.-It does appear the patient has an extended mild hematoma below her anterior throat incision site.  There is some mild edema to the site; but no erythema, warmth, drainage, or tenderness.  There is no evidence of infection.         Eyes: Conjunctivae and EOM are normal. Pupils are equal, round, and reactive to light. Right eye exhibits no discharge. Left eye exhibits no discharge. No scleral icterus.  Neck: Normal range of motion. Neck supple. No JVD present. No tracheal deviation present. No thyromegaly present.  Cardiovascular: Normal heart sounds and intact distal pulses.   tachycardia  Pulmonary/Chest: Effort normal and breath sounds normal. No stridor. No respiratory distress. She has no wheezes. She has no rales. She exhibits no tenderness.  Abdominal: Soft. Bowel sounds are normal. She  exhibits no distension and no mass. There is no tenderness. There is no rebound and no guarding.  Musculoskeletal: Normal range of motion. She exhibits no edema or tenderness.  Lymphadenopathy:    She has no cervical adenopathy.  Neurological: She is alert and oriented to person, place, and time. Gait normal.  Skin: Skin is warm and dry. No rash noted. No erythema. No pallor.  See previous note regarding the surgery site.   Psychiatric: Affect normal.  Nursing note and vitals reviewed.   LABORATORY DATA:. Appointment on 07/24/2015  Component Date Value Ref Range Status  . WBC 07/24/2015 6.5  3.9 - 10.3 10e3/uL Final  . NEUT# 07/24/2015 5.1  1.5 - 6.5 10e3/uL Final  . HGB 07/24/2015 10.0* 11.6 - 15.9 g/dL Final  . HCT 07/24/2015 32.4* 34.8 - 46.6 % Final  . Platelets 07/24/2015 207  145 - 400 10e3/uL Final  . MCV 07/24/2015 85.7  79.5 - 101.0 fL Final  . MCH 07/24/2015 26.5  25.1 - 34.0 pg Final  . MCHC 07/24/2015 30.9* 31.5 - 36.0 g/dL Final  . RBC 07/24/2015 3.78  3.70 -  5.45 10e6/uL Final  . RDW 07/24/2015 18.4* 11.2 - 14.5 % Final  . lymph# 07/24/2015 0.7* 0.9 - 3.3 10e3/uL Final  . MONO# 07/24/2015 0.7  0.1 - 0.9 10e3/uL Final  . Eosinophils Absolute 07/24/2015 0.1  0.0 - 0.5 10e3/uL Final  . Basophils Absolute 07/24/2015 0.0  0.0 - 0.1 10e3/uL Final  . NEUT% 07/24/2015 77.6* 38.4 - 76.8 % Final  . LYMPH% 07/24/2015 10.0* 14.0 - 49.7 % Final  . MONO% 07/24/2015 10.1  0.0 - 14.0 % Final  . EOS% 07/24/2015 2.0  0.0 - 7.0 % Final  . BASO% 07/24/2015 0.3  0.0 - 2.0 % Final  . Sodium 07/24/2015 139  136 - 145 mEq/L Final  . Potassium 07/24/2015 4.3  3.5 - 5.1 mEq/L Final  . Chloride 07/24/2015 104  98 - 109 mEq/L Final  . CO2 07/24/2015 27  22 - 29 mEq/L Final  . Glucose 07/24/2015 360* 70 - 140 mg/dl Final   Glucose reference range is for nonfasting patients. Fasting glucose reference range is 70- 100.  Marland Kitchen BUN 07/24/2015 6.7* 7.0 - 26.0 mg/dL Final  . Creatinine 07/24/2015 0.8   0.6 - 1.1 mg/dL Final  . Total Bilirubin 07/24/2015 <0.30  0.20 - 1.20 mg/dL Final  . Alkaline Phosphatase 07/24/2015 89  40 - 150 U/L Final  . AST 07/24/2015 18  5 - 34 U/L Final  . ALT 07/24/2015 11  0 - 55 U/L Final  . Total Protein 07/24/2015 6.3* 6.4 - 8.3 g/dL Final  . Albumin 07/24/2015 2.9* 3.5 - 5.0 g/dL Final  . Calcium 07/24/2015 8.1* 8.4 - 10.4 mg/dL Final  . Anion Gap 07/24/2015 7  3 - 11 mEq/L Final  . EGFR 07/24/2015 83* >90 ml/min/1.73 m2 Final   eGFR is calculated using the CKD-EPI Creatinine Equation (2009)  Admission on 07/22/2015, Discharged on 07/22/2015  Component Date Value Ref Range Status  . Glucose-Capillary 07/22/2015 168* 65 - 99 mg/dL Final  . Glucose-Capillary 07/22/2015 169* 65 - 99 mg/dL Final       RADIOGRAPHIC STUDIES: No results found.  ASSESSMENT/PLAN:    Primary cancer of right upper lobe of lung [facility]) Patient received her last cycle of gemcitabine chemotherapy on 07/10/2015.  She underwent a vocal cord medialization surgery this past Tuesday, 07/22/2015 for vocal cord paralysis per Dr. [name] ENT.  Patient is scheduled to return on 07/31/2015 for labs, visit, and her next cycle of chemotherapy.  Vocal cord edema Patient received her last cycle of gemcitabine chemotherapy on 07/10/2015.  She underwent a vocal cord medialization surgery this past Tuesday, 07/22/2015 for vocal cord paralysis per Dr. [name] ENT.  She presented to the [facility] today with complaint of mild shortness of breath and increased swelling of her throat following her recent surgery.  Patient denies any chest pain, chest pressure, or pain with inspiration.  She denies having any issues, managing her oral secretions.  She also denies any swelling of her tongue.  On exam.-It does appear the patient has an extended mild hematoma below her anterior throat incision site.  There is some mild edema to the site; but no erythema, warmth, drainage, or tenderness.  There is no evidence  of infection.  Patient appears to be managing her airway and her oral secretions fairly well.  O2 sat was 96% on room air.  Patient reports that she continues with her Lovenox bridge at; and restarted her Xarelto again this morning as well.  Called and reviewed all these findings with  Dr. [name] ENT; and he advised the patient should come to his office for further evaluation and management.  Receives call back from Dr. [name].  Following evaluation of the patient; and he advised the patient was in no acute distress.  However, he did recommend that patient presented to the [facility] emergency department for planned admission for 24-hour observation to make sure that the hematoma does not progress and cause further difficulties. He also advised that pt hold all anticoagulation medications until Saturday 07/26/15. Per Dr. Wyman Songster presented to the emergency department for further evaluation and management; and planned admission for 24-hour observation only.  Patient is scheduled to return on 07/31/2015 for labs, visit, and her next cycle of chemotherapy.  Long term current use of anticoagulant therapy Pt with hx of DVT in the past. Pt has been undergoing a Lovenox bridge; and re-started the Xarelto today as well. Dr. [name] has instructed pt to hold all anticoagulants until Saturday 07/26/15.    Patient stated understanding of all instructions; and was in agreement with this plan of care. The patient knows to call the clinic with any problems, questions or concerns.   Review/collaboration with Dr. [name] and Dr. [name] regarding all aspects of patient's visit today.   Total time spent with patient was 40 minutes;  with greater than 75 percent of that time spent in face to face counseling regarding patient's symptoms,  and coordination of care and follow up.  Disclaimer:This dictation was prepared with Dragon/digital dictation along with [employer]. Any transcriptional errors  that result from this process are unintentional.  Drue Second, NP 07/26/2015

## 2015-07-31 ENCOUNTER — Encounter (HOSPITAL_BASED_OUTPATIENT_CLINIC_OR_DEPARTMENT_OTHER): Payer: Self-pay | Admitting: Otolaryngology

## 2015-07-31 ENCOUNTER — Telehealth: Payer: Self-pay | Admitting: Internal Medicine

## 2015-07-31 ENCOUNTER — Ambulatory Visit: Payer: Medicare Other

## 2015-07-31 ENCOUNTER — Telehealth: Payer: Self-pay | Admitting: *Deleted

## 2015-07-31 ENCOUNTER — Ambulatory Visit (HOSPITAL_BASED_OUTPATIENT_CLINIC_OR_DEPARTMENT_OTHER): Payer: Medicare Other | Admitting: Internal Medicine

## 2015-07-31 ENCOUNTER — Other Ambulatory Visit (HOSPITAL_BASED_OUTPATIENT_CLINIC_OR_DEPARTMENT_OTHER): Payer: Medicare Other

## 2015-07-31 ENCOUNTER — Other Ambulatory Visit: Payer: Medicare Other

## 2015-07-31 VITALS — BP 140/84 | HR 124 | Temp 97.8°F | Resp 18 | Ht 67.0 in | Wt 157.1 lb

## 2015-07-31 DIAGNOSIS — Z7901 Long term (current) use of anticoagulants: Secondary | ICD-10-CM | POA: Diagnosis not present

## 2015-07-31 DIAGNOSIS — Z86718 Personal history of other venous thrombosis and embolism: Secondary | ICD-10-CM | POA: Diagnosis not present

## 2015-07-31 DIAGNOSIS — C3411 Malignant neoplasm of upper lobe, right bronchus or lung: Secondary | ICD-10-CM

## 2015-07-31 DIAGNOSIS — Z66 Do not resuscitate: Secondary | ICD-10-CM

## 2015-07-31 HISTORY — DX: Do not resuscitate: Z66

## 2015-07-31 LAB — COMPREHENSIVE METABOLIC PANEL (CC13)
ALBUMIN: 3.1 g/dL — AB (ref 3.5–5.0)
ALK PHOS: 101 U/L (ref 40–150)
ALT: 11 U/L (ref 0–55)
ANION GAP: 10 meq/L (ref 3–11)
AST: 16 U/L (ref 5–34)
BUN: 7.4 mg/dL (ref 7.0–26.0)
CALCIUM: 8.6 mg/dL (ref 8.4–10.4)
CHLORIDE: 100 meq/L (ref 98–109)
CO2: 26 mEq/L (ref 22–29)
CREATININE: 0.7 mg/dL (ref 0.6–1.1)
EGFR: >90 mL/min/{1.73_m2} (ref >90)
Glucose: 228 mg/dl — ABNORMAL HIGH (ref 70–140)
POTASSIUM: 4.4 meq/L (ref 3.5–5.1)
Sodium: 135 mEq/L — ABNORMAL LOW (ref 136–145)
Total Bilirubin: 0.37 mg/dL (ref 0.20–1.20)
Total Protein: 7 g/dL (ref 6.4–8.3)

## 2015-07-31 LAB — CBC WITH DIFFERENTIAL/PLATELET
BASO%: 1.3 % (ref 0.0–2.0)
BASOS ABS: 0.1 10*3/uL (ref 0.0–0.1)
EOS ABS: 0.2 10*3/uL (ref 0.0–0.5)
EOS%: 2.1 % (ref 0.0–7.0)
HEMATOCRIT: 35.7 % (ref 34.8–46.6)
HGB: 11.5 g/dL — ABNORMAL LOW (ref 11.6–15.9)
LYMPH#: 1 10*3/uL (ref 0.9–3.3)
LYMPH%: 11.3 % — ABNORMAL LOW (ref 14.0–49.7)
MCH: 26.3 pg (ref 25.1–34.0)
MCHC: 32.2 g/dL (ref 31.5–36.0)
MCV: 81.9 fL (ref 79.5–101.0)
MONO#: 0.9 10*3/uL (ref 0.1–0.9)
MONO%: 9.9 % (ref 0.0–14.0)
NEUT#: 6.6 10*3/uL — ABNORMAL HIGH (ref 1.5–6.5)
NEUT%: 75.4 % (ref 38.4–76.8)
PLATELETS: 302 10*3/uL (ref 145–400)
RBC: 4.36 10*6/uL (ref 3.70–5.45)
RDW: 18.4 % — ABNORMAL HIGH (ref 11.2–14.5)
WBC: 8.8 10*3/uL (ref 3.9–10.3)

## 2015-07-31 MED ORDER — OXYCODONE HCL 5 MG PO TABS: 5.0000 mg | ORAL_TABLET | ORAL | Status: DC | PRN

## 2015-07-31 NOTE — Telephone Encounter (Signed)
Called notified pt Pain medication ready for pick up.

## 2015-07-31 NOTE — Progress Notes (Signed)
Hold treatment today per Armanda Magic RN/ Dr. [name].

## 2015-07-31 NOTE — Telephone Encounter (Signed)
Gave and printed appt sched anda vs for pt for OCT thru [facility]

## 2015-07-31 NOTE — Progress Notes (Signed)
Janice Ball [phone]   [phone]  OFFICE PROGRESS NOTE  [name], NP [place] [address]  DIAGNOSIS: Recurrent non-small cell lung cancer, squamous cell carcinoma initially diagnosis stage IIIA (T1 N2 MX ) in August of 2010.   PRIOR THERAPY:  #1 status post concurrent chemoradiation with weekly carboplatin and paclitaxel, last dose of chemotherapy given 08/05/2011.  #2 status post consolidation chemotherapy with carboplatin paclitaxel last dose given 10/27/2009.  #3 Concurrent chemoradiation with weekly carboplatin for an AUC of 2 and paclitaxel at 45 mg per meter squared given concurrent with radiotherapy, last dose was given 09/20/2011.  #4 Systemic chemotherapy with gemcitabine 1000 mg meter squared on days 1 and 8 every 3 weeks status post 3 cycles with stable disease, last dose was given 12/20/2011.  #5 Systemic chemotherapy again with single agent gemcitabine 1000 mg/M2 on days 1 and 8 every 3 weeks. First cycle 11/07/2013. Status post 4 cycles. #6  Systemic chemotherapy again with single agent gemcitabine 1000 mg/M2 on days 1 and 8 every 3 weeks. First dose 07/24/2014. Discontinued today secondary to intolerance. #7  Immunotherapy with Nivolumab 3 MG/KG every 2 weeks. First dose 09/18/2014. She is status post 2 cycles. Last dose was given 10/02/2014 discontinued secondary to disease progression with significant SVC syndrome.  CURRENT THERAPY: Systemic chemotherapy with gemcitabine 800 MG/M2 on days 1 and 8 every 3 weeks. First dose 11/07/2014. Status post 10 cycles.  INTERVAL HISTORY: Janice Ball 59 y.o. female returns to the clinic today for follow-up visit. The patient is feeling fine today with no specific complaints except for mild shortness of breath and hoarseness of her voice. She recently underwent left medialization thyroplasty by Dr. [name]. She was admitted to the hospital a few days after the  surgery with throat congestion and questionable hematoma in this area. She is feeling a little bit better today but continues to have hoarseness of her voice as well as generalized fatigue. She has been tolerating her current treatment with single agent gemcitabine fairly well with no significant adverse effects. She denied having any significant chest pain but has mild cough with no hemoptysis. She denied having any significant weight loss or night sweats. She denied having any nausea or vomiting. She has no fever or chills. She was supposed to start cycle #11 today.  MEDICAL HISTORY: Past Medical History  Diagnosis Date  . Anxiety   . Hyperlipidemia   . Hypothyroidism   . COPD (chronic obstructive pulmonary disease) ([place])   . Arthritis     thumbs  . 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 Lung cancer ([place]) dx'd 05/2009  . Lung cancer ([place]) dx'd 09/2013    recurrence in LN  . Diabetes mellitus   . Pulmonary embolism ([place])     'I've had a blood clot in my chest'    ALLERGIES:  is allergic to carboplatin; sulfonamide derivatives; codeine; dilaudid; keflex; penicillins; and vicodin.  MEDICATIONS:  Current Outpatient Prescriptions  Medication Sig Dispense Refill  . acetaminophen (TYLENOL) 325 MG tablet Take 650 mg by mouth every 6 (six) hours as needed for mild pain or headache.     . alendronate (FOSAMAX) 10 MG tablet Take 10 mg by mouth daily.     Marland Kitchen atorvastatin (LIPITOR) 20 MG tablet Take 20 mg by mouth at bedtime.  4  . benzonatate (  TESSALON) 200 MG capsule Take 1 capsule (200 mg total) by mouth 3 (three) times daily as needed for cough. 30 capsule 1  . budesonide-formoterol (SYMBICORT) 160-4.5 MCG/ACT inhaler Inhale 2 puffs into the lungs 2 (two) times daily.    . cholecalciferol (VITAMIN D) 1000 UNITS tablet Take 1,000 Units by mouth every morning.     . [employer] ASPIRIN ADULT LOW DOSE 81  MG chewable tablet Chew 81 mg by mouth daily.  0  . cyanocobalamin 1000 MCG tablet Take 1,000 mcg by mouth every morning.     Marland Kitchen glimepiride (AMARYL) 2 MG tablet Take 2 tablets (4 mg total) by mouth daily with breakfast. Take higher dose (12mby mouth) while on prednisone. Once off prednisone go back to home regimen of 2 mg by mouth daily. (Patient taking differently: Take 2 mg by mouth daily with breakfast. ) 30 tablet 0  . guaiFENesin (MUCINEX) 600 MG 12 hr tablet Take 600 mg by mouth 2 (two) times daily.     .Marland Kitchenlevothyroxine (SYNTHROID, LEVOTHROID) 137 MCG tablet Take 137 mcg by mouth daily.     .Marland KitchenLORazepam (ATIVAN) 1 MG tablet Take 1 mg by mouth 3 (three) times daily as needed for anxiety.     . magnesium gluconate (MAGONATE) 500 MG tablet Take 500 mg by mouth daily.     . metFORMIN (GLUCOPHAGE) 1000 MG tablet Take 1,000 mg by mouth 2 (two) times daily with a meal.    . Multiple Vitamin (MULTIVITAMIN WITH MINERALS) TABS tablet Take 1 tablet by mouth every morning.    .Marland KitchenoxyCODONE (OXY IR/ROXICODONE) 5 MG immediate release tablet Take 1 tablet (5 mg total) by mouth every 4 (four) hours as needed for severe pain. 60 tablet 0  . PROAIR HFA 108 (90 BASE) MCG/ACT inhaler INHALE 2 PUFFS INTO THE LUNGS EVERY 6 HOURS AS NEEDED FOR WHEEZING OR SHORTNESS OF BREATH. 1 Inhaler 2  . prochlorperazine (COMPAZINE) 10 MG tablet Take 1 tablet (10 mg total) by mouth every 6 (six) hours as needed for nausea or vomiting. 30 tablet 1  . rivaroxaban (XARELTO) 20 MG TABS tablet Take 1 tablet (20 mg total) by mouth daily with supper. 30 tablet 3  . sertraline (ZOLOFT) 100 MG tablet Take 150 mg by mouth at bedtime.     .Marland Kitchenspironolactone (ALDACTONE) 25 MG tablet Take 25 mg by mouth every morning.  4  . tiotropium (SPIRIVA) 18 MCG inhalation capsule Place 1 capsule (18 mcg total) into inhaler and inhale daily. 30 capsule 12  . vitamin C (ASCORBIC ACID) 500 MG tablet Take 500 mg by mouth 2 (two) times daily.    .Marland Kitchenatorvastatin  (LIPITOR) 80 MG tablet TAKE 1 TABLET EVERY DAY AT 6 PM  0  . diphenhydrAMINE (SOMINEX) 25 MG tablet Take 50 mg by mouth at bedtime as needed for itching or sleep.     No current facility-administered medications for this visit.   Facility-Administered Medications Ordered in Other Visits  Medication Dose Route Frequency Provider Last Rate Last Dose  . sodium chloride 0.9 % injection 10 mL  10 mL Intracatheter PRN MCurt Bears MD   10 mL at 11/07/14 1558    SURGICAL HISTORY:  Past Surgical History  Procedure Laterality Date  . Thyroidectomy, partial    . Tonsillectomy    . Tubal ligation    . Partial hysterectomy    . Tonsillectomy    . Laryngoplasty Left 07/22/2015    Procedure: LEFT VOCAL CORD MEDIALIZATION;  Surgeon:  Rozetta Nunnery, MD;  Location: [facility];  Service: ENT;  Laterality: Left;    REVIEW OF SYSTEMS:  Constitutional: positive for fatigue Eyes: negative Ears, nose, mouth, throat, and face: positive for hoarseness Respiratory: positive for cough and dyspnea on exertion Cardiovascular: negative Gastrointestinal: negative Genitourinary:negative Integument/breast: negative Hematologic/lymphatic: negative Musculoskeletal:negative Neurological: negative Behavioral/Psych: negative Endocrine: negative Allergic/Immunologic: negative   PHYSICAL EXAMINATION: General appearance: alert, cooperative, no distress and Swelling of her face and puffiness of her eye Head: Normocephalic, without obvious abnormality, atraumatic Neck: no adenopathy, no JVD, supple, symmetrical, trachea midline and thyroid not enlarged, symmetric, no tenderness/mass/nodules Lymph nodes: Cervical, supraclavicular, and axillary nodes normal. Resp: wheezes bilaterally Back: symmetric, no curvature. ROM normal. No CVA tenderness. Cardio: regular rate and rhythm, S1, S2 normal, no murmur, click, rub or gallop GI: soft, non-tender; bowel sounds normal; no masses,  no  organomegaly Extremities: extremities normal, atraumatic, no cyanosis or edema Neurologic: Alert and oriented X 3, normal strength and tone. Normal symmetric reflexes. Normal coordination and gait  ECOG PERFORMANCE STATUS: 1 - Symptomatic but completely ambulatory  Blood pressure 140/84, pulse 124, temperature 97.8 F (36.6 C), temperature source Oral, resp. rate 18, height '5\' 7"'$  (1.702 m), weight 157 lb 1.6 oz (71.26 kg), SpO2 94 %.  LABORATORY DATA: Lab Results  Component Value Date   WBC 8.8 07/31/2015   HGB 11.5* 07/31/2015   HCT 35.7 07/31/2015   MCV 81.9 07/31/2015   PLT 302 07/31/2015      Chemistry      Component Value Date/Time   NA 135* 07/31/2015 1134   NA 135 04/26/2015 0430   NA 141 04/04/2012 0945   K 4.4 07/31/2015 1134   K 4.3 04/26/2015 0430   K 4.7 04/04/2012 0945   CL 96* 04/26/2015 0430   CL 101 04/06/2013 1041   CL 100 04/04/2012 0945   CO2 26 07/31/2015 1134   CO2 28 04/26/2015 0430   CO2 29 04/04/2012 0945   BUN 7.4 07/31/2015 1134   BUN 25* 04/26/2015 0430   BUN 16 04/04/2012 0945   CREATININE 0.7 07/31/2015 1134   CREATININE 0.62 04/26/2015 0430   CREATININE 0.9 04/04/2012 0945      Component Value Date/Time   CALCIUM 8.6 07/31/2015 1134   CALCIUM 8.1* 04/26/2015 0430   CALCIUM 8.4 04/04/2012 0945   ALKPHOS 101 07/31/2015 1134   ALKPHOS 74 04/20/2015 0340   ALKPHOS 61 04/04/2012 0945   AST 16 07/31/2015 1134   AST 22 04/20/2015 0340   AST 31 04/04/2012 0945   ALT 11 07/31/2015 1134   ALT 14 04/20/2015 0340   ALT 30 04/04/2012 0945   BILITOT 0.37 07/31/2015 1134   BILITOT 0.6 04/20/2015 0340   BILITOT 0.50 04/04/2012 0945       RADIOGRAPHIC STUDIES: No results found. ASSESSMENT AND PLAN: This is a very pleasant 59 years old white female with recurrent non-small cell lung cancer status post concurrent chemoradiation as well as consolidation chemotherapy and she is currently on systemic chemotherapy with single agent gemcitabine  status post 4 cycles.  She was treated again with a course of systemic chemotherapy with single agent gemcitabine for 3 cycles but the patient has rough time tolerating her treatment with significant fatigue and weakness. The recent CT scan of the chest, abdomen and pelvis showed stable disease but the patient continues to have the persistent right upper lobe area of mass fibrosis and consolidation as well as persistent right hilar, subcarinal and right paratracheal  lymphadenopathy as well as, one area of masslike soft tissue immediately anterior to the carina. She was started on treatment with Nivolumab status post 2 cycles. She tolerated the treatment well but she presented with evidence for disease progression in the chest. This treatment was discontinued. 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 Further imaging studies showed evidence for disease progression. The patient was restarted on systemic chemotherapy initially with carboplatin and gemcitabine body carboplatin was discontinued secondary to hypersensitivity reaction. She continued on treatment with single agent gemcitabine with reduced dose 800 MG/M2 on days 1 and 8 every 3 weeks status post 10 cycles. She is tolerating her treatment fairly well with no significant adverse effects. The patient is still recovering from her recent surgery. I recommended for her to delay the start of cycle #11 by 1 week. She will come back for follow-up visit in 4 weeks with the start of cycle #12. For the history of deep venous thrombosis she will continue on Lovenox subcutaneously on daily basis. For pain management, she will continue on oxycodone when necessary. She was advised to call immediately if she has any concerning symptoms in the interval.  All questions were answered. The patient knows to call the clinic with any problems, questions or concerns. We can certainly see the patient much sooner if  necessary.   Disclaimer: This note was dictated with voice recognition software. Similar sounding words can inadvertently be transcribed and may not be corrected upon review.

## 2015-07-31 NOTE — Progress Notes (Signed)
Pt here in lobby forgot to ask MD about refill on pain medication. Reviewed with MD, gave pt rx in waiting room

## 2015-08-06 ENCOUNTER — Other Ambulatory Visit: Payer: Medicare Other

## 2015-08-06 ENCOUNTER — Ambulatory Visit: Payer: Medicare Other | Admitting: Internal Medicine

## 2015-08-06 ENCOUNTER — Ambulatory Visit: Payer: Medicare Other

## 2015-08-07 ENCOUNTER — Other Ambulatory Visit: Payer: Self-pay

## 2015-08-07 ENCOUNTER — Other Ambulatory Visit (HOSPITAL_BASED_OUTPATIENT_CLINIC_OR_DEPARTMENT_OTHER): Payer: Medicare Other

## 2015-08-07 ENCOUNTER — Emergency Department (HOSPITAL_COMMUNITY): Payer: Medicare Other

## 2015-08-07 ENCOUNTER — Encounter (HOSPITAL_COMMUNITY): Payer: Self-pay | Admitting: Emergency Medicine

## 2015-08-07 ENCOUNTER — Inpatient Hospital Stay (HOSPITAL_COMMUNITY)
Admission: EM | Admit: 2015-08-07 | Discharge: 2015-08-10 | DRG: 190 | Disposition: A | Discharge status: Home Health | Payer: Medicare Other | Attending: Internal Medicine | Admitting: Internal Medicine

## 2015-08-07 ENCOUNTER — Ambulatory Visit (HOSPITAL_BASED_OUTPATIENT_CLINIC_OR_DEPARTMENT_OTHER): Payer: Medicare Other

## 2015-08-07 VITALS — BP 148/82 | HR 126 | Temp 98.3°F | Resp 20

## 2015-08-07 DIAGNOSIS — C3411 Malignant neoplasm of upper lobe, right bronchus or lung: Secondary | ICD-10-CM

## 2015-08-07 DIAGNOSIS — E119 Type 2 diabetes mellitus without complications: Secondary | ICD-10-CM | POA: Diagnosis present

## 2015-08-07 DIAGNOSIS — R000 Tachycardia, unspecified: Secondary | ICD-10-CM | POA: Diagnosis present

## 2015-08-07 DIAGNOSIS — J9621 Acute and chronic respiratory failure with hypoxia: Secondary | ICD-10-CM | POA: Diagnosis not present

## 2015-08-07 DIAGNOSIS — Z86718 Personal history of other venous thrombosis and embolism: Secondary | ICD-10-CM

## 2015-08-07 DIAGNOSIS — Z7901 Long term (current) use of anticoagulants: Secondary | ICD-10-CM

## 2015-08-07 DIAGNOSIS — J3801 Paralysis of vocal cords and larynx, unilateral: Secondary | ICD-10-CM

## 2015-08-07 DIAGNOSIS — J441 Chronic obstructive pulmonary disease with (acute) exacerbation: Secondary | ICD-10-CM | POA: Diagnosis present

## 2015-08-07 DIAGNOSIS — Z800 Family history of malignant neoplasm of digestive organs: Secondary | ICD-10-CM | POA: Diagnosis not present

## 2015-08-07 DIAGNOSIS — Z79899 Other long term (current) drug therapy: Secondary | ICD-10-CM | POA: Diagnosis not present

## 2015-08-07 DIAGNOSIS — Z86711 Personal history of pulmonary embolism: Secondary | ICD-10-CM

## 2015-08-07 DIAGNOSIS — Z5111 Encounter for antineoplastic chemotherapy: Secondary | ICD-10-CM

## 2015-08-07 DIAGNOSIS — R0602 Shortness of breath: Secondary | ICD-10-CM | POA: Diagnosis present

## 2015-08-07 DIAGNOSIS — Z91010 Allergy to peanuts: Secondary | ICD-10-CM | POA: Diagnosis not present

## 2015-08-07 DIAGNOSIS — E785 Hyperlipidemia, unspecified: Secondary | ICD-10-CM | POA: Diagnosis present

## 2015-08-07 DIAGNOSIS — Z888 Allergy status to other drugs, medicaments and biological substances status: Secondary | ICD-10-CM

## 2015-08-07 DIAGNOSIS — J9620 Acute and chronic respiratory failure, unspecified whether with hypoxia or hypercapnia: Secondary | ICD-10-CM | POA: Diagnosis present

## 2015-08-07 DIAGNOSIS — Z833 Family history of diabetes mellitus: Secondary | ICD-10-CM | POA: Diagnosis not present

## 2015-08-07 DIAGNOSIS — E039 Hypothyroidism, unspecified: Secondary | ICD-10-CM

## 2015-08-07 DIAGNOSIS — M810 Age-related osteoporosis without current pathological fracture: Secondary | ICD-10-CM | POA: Diagnosis present

## 2015-08-07 DIAGNOSIS — Z7982 Long term (current) use of aspirin: Secondary | ICD-10-CM | POA: Diagnosis not present

## 2015-08-07 DIAGNOSIS — Z87891 Personal history of nicotine dependence: Secondary | ICD-10-CM

## 2015-08-07 DIAGNOSIS — F411 Generalized anxiety disorder: Secondary | ICD-10-CM | POA: Diagnosis present

## 2015-08-07 DIAGNOSIS — Z66 Do not resuscitate: Secondary | ICD-10-CM | POA: Diagnosis present

## 2015-08-07 DIAGNOSIS — Z882 Allergy status to sulfonamides status: Secondary | ICD-10-CM

## 2015-08-07 DIAGNOSIS — Z79891 Long term (current) use of opiate analgesic: Secondary | ICD-10-CM

## 2015-08-07 DIAGNOSIS — I5032 Chronic diastolic (congestive) heart failure: Secondary | ICD-10-CM | POA: Diagnosis present

## 2015-08-07 DIAGNOSIS — M1990 Unspecified osteoarthritis, unspecified site: Secondary | ICD-10-CM | POA: Diagnosis present

## 2015-08-07 DIAGNOSIS — I4581 Long QT syndrome: Secondary | ICD-10-CM | POA: Diagnosis present

## 2015-08-07 DIAGNOSIS — F329 Major depressive disorder, single episode, unspecified: Secondary | ICD-10-CM | POA: Diagnosis present

## 2015-08-07 DIAGNOSIS — Z881 Allergy status to other antibiotic agents status: Secondary | ICD-10-CM

## 2015-08-07 DIAGNOSIS — Z885 Allergy status to narcotic agent status: Secondary | ICD-10-CM

## 2015-08-07 DIAGNOSIS — I82409 Acute embolism and thrombosis of unspecified deep veins of unspecified lower extremity: Secondary | ICD-10-CM | POA: Diagnosis present

## 2015-08-07 LAB — BASIC METABOLIC PANEL
Anion gap: 10 (ref 5–15)
BUN: 9 mg/dL (ref 6–20)
CHLORIDE: 101 mmol/L (ref 101–111)
CO2: 25 mmol/L (ref 22–32)
CREATININE: 0.78 mg/dL (ref 0.44–1.00)
Calcium: 7.9 mg/dL — ABNORMAL LOW (ref 8.9–10.3)
GFR calc Af Amer: >60 mL/min (ref >60)
GLUCOSE: 273 mg/dL — AB (ref 65–99)
POTASSIUM: 3.8 mmol/L (ref 3.5–5.1)
Sodium: 136 mmol/L (ref 135–145)

## 2015-08-07 LAB — CBC WITH DIFFERENTIAL/PLATELET
BASO%: 0.6 % (ref 0.0–2.0)
BASOS ABS: 0.1 10*3/uL (ref 0.0–0.1)
Basophils Absolute: 0 10*3/uL (ref 0.0–0.1)
Basophils Relative: 0 %
EOS ABS: 0.1 10*3/uL (ref 0.0–0.7)
EOS PCT: 1 %
EOS%: 1.4 % (ref 0.0–7.0)
Eosinophils Absolute: 0.1 10*3/uL (ref 0.0–0.5)
HCT: 35 % (ref 34.8–46.6)
HCT: 33.6 % — ABNORMAL LOW (ref 36.0–46.0)
HEMOGLOBIN: 10.4 g/dL — AB (ref 12.0–15.0)
HGB: 10.8 g/dL — ABNORMAL LOW (ref 11.6–15.9)
LYMPH#: 0.8 10*3/uL — AB (ref 0.9–3.3)
LYMPH%: 9.4 % — AB (ref 14.0–49.7)
LYMPHS ABS: 0.7 10*3/uL (ref 0.7–4.0)
LYMPHS PCT: 8 %
MCH: 25.8 pg (ref 25.1–34.0)
MCH: 25.7 pg — AB (ref 26.0–34.0)
MCHC: 30.9 g/dL — AB (ref 31.5–36.0)
MCHC: 31 g/dL (ref 30.0–36.0)
MCV: 83.5 fL (ref 79.5–101.0)
MCV: 83 fL (ref 78.0–100.0)
MONO#: 0.8 10*3/uL (ref 0.1–0.9)
MONO%: 8.4 % (ref 0.0–14.0)
MONOS PCT: 5 %
Monocytes Absolute: 0.5 10*3/uL (ref 0.1–1.0)
NEUT#: 7.2 10*3/uL — ABNORMAL HIGH (ref 1.5–6.5)
NEUT%: 80.2 % — AB (ref 38.4–76.8)
Neutro Abs: 7.5 10*3/uL (ref 1.7–7.7)
Neutrophils Relative %: 86 %
PLATELETS: 202 10*3/uL (ref 150–400)
Platelets: 195 10*3/uL (ref 145–400)
RBC: 4.19 10*6/uL (ref 3.70–5.45)
RBC: 4.05 MIL/uL (ref 3.87–5.11)
RDW: 16.8 % — ABNORMAL HIGH (ref 11.2–14.5)
RDW: 16.7 % — ABNORMAL HIGH (ref 11.5–15.5)
WBC: 9 10*3/uL (ref 3.9–10.3)
WBC: 8.7 10*3/uL (ref 4.0–10.5)

## 2015-08-07 LAB — COMPREHENSIVE METABOLIC PANEL (CC13)
ALT: 9 U/L (ref 0–55)
AST: 13 U/L (ref 5–34)
Albumin: 2.9 g/dL — ABNORMAL LOW (ref 3.5–5.0)
Alkaline Phosphatase: 95 U/L (ref 40–150)
Anion Gap: 10 mEq/L (ref 3–11)
BUN: 8 mg/dL (ref 7.0–26.0)
CALCIUM: 8.5 mg/dL (ref 8.4–10.4)
CHLORIDE: 104 meq/L (ref 98–109)
CO2: 24 mEq/L (ref 22–29)
CREATININE: 0.7 mg/dL (ref 0.6–1.1)
EGFR: >90 mL/min/{1.73_m2} (ref >90)
Glucose: 286 mg/dl — ABNORMAL HIGH (ref 70–140)
POTASSIUM: 4.4 meq/L (ref 3.5–5.1)
SODIUM: 138 meq/L (ref 136–145)
Total Bilirubin: <0.3 mg/dL (ref 0.20–1.20)
Total Protein: 6.7 g/dL (ref 6.4–8.3)

## 2015-08-07 LAB — APTT: APTT: 36 s (ref 24–37)

## 2015-08-07 LAB — PROTIME-INR
INR: 1.17 (ref 0.00–1.49)
Prothrombin Time: 15 seconds (ref 11.6–15.2)

## 2015-08-07 LAB — TROPONIN I: Troponin I: <0.03 ng/mL (ref <0.031)

## 2015-08-07 MED ORDER — BENZONATATE 100 MG PO CAPS: 200.0000 mg | ORAL_CAPSULE | Freq: Three times a day (TID) | ORAL | Status: DC | PRN

## 2015-08-07 MED ORDER — ALENDRONATE SODIUM 10 MG PO TABS: 10.0000 mg | ORAL_TABLET | Freq: Every day | ORAL | Status: DC

## 2015-08-07 MED ORDER — RIVAROXABAN 20 MG PO TABS
20.0000 mg | ORAL_TABLET | Freq: Every day | ORAL | Status: DC
  Administered 2015-08-08 – 2015-08-09 (×3): 20 mg via ORAL
  Filled 2015-08-07 (×4): qty 1

## 2015-08-07 MED ORDER — ADULT MULTIVITAMIN W/MINERALS CH
1.0000 | ORAL_TABLET | Freq: Every morning | ORAL | Status: DC
  Administered 2015-08-09 – 2015-08-10 (×2): 1 via ORAL
  Filled 2015-08-07 (×2): qty 1

## 2015-08-07 MED ORDER — ASPIRIN 81 MG PO CHEW
81.0000 mg | CHEWABLE_TABLET | Freq: Every day | ORAL | Status: DC
  Administered 2015-08-09 – 2015-08-10 (×2): 81 mg via ORAL
  Filled 2015-08-07 (×2): qty 1

## 2015-08-07 MED ORDER — TIOTROPIUM BROMIDE MONOHYDRATE 18 MCG IN CAPS
18.0000 ug | ORAL_CAPSULE | Freq: Every day | RESPIRATORY_TRACT | Status: DC
  Administered 2015-08-08 – 2015-08-10 (×3): 18 ug via RESPIRATORY_TRACT
  Filled 2015-08-07: qty 5

## 2015-08-07 MED ORDER — ONDANSETRON HCL 4 MG/2ML IJ SOLN
4.0000 mg | Freq: Once | INTRAMUSCULAR | Status: AC
  Administered 2015-08-07: 4 mg via INTRAVENOUS
  Filled 2015-08-07: qty 2

## 2015-08-07 MED ORDER — SODIUM CHLORIDE 0.9 % IJ SOLN
3.0000 mL | Freq: Two times a day (BID) | INTRAMUSCULAR | Status: DC
  Administered 2015-08-07 – 2015-08-10 (×5): 3 mL via INTRAVENOUS

## 2015-08-07 MED ORDER — LEVOTHYROXINE SODIUM 112 MCG PO TABS
137.0000 ug | ORAL_TABLET | Freq: Every day | ORAL | Status: DC
  Administered 2015-08-08 – 2015-08-10 (×3): 137 ug via ORAL
  Filled 2015-08-07 (×5): qty 1

## 2015-08-07 MED ORDER — SODIUM CHLORIDE 0.9 % IJ SOLN
10.0000 mL | INTRAMUSCULAR | Status: DC | PRN
  Administered 2015-08-07: 10 mL
  Filled 2015-08-07: qty 10

## 2015-08-07 MED ORDER — LEVALBUTEROL HCL 1.25 MG/0.5ML IN NEBU
1.2500 mg | INHALATION_SOLUTION | Freq: Four times a day (QID) | RESPIRATORY_TRACT | Status: DC
  Administered 2015-08-08: 1.25 mg via RESPIRATORY_TRACT
  Filled 2015-08-07 (×2): qty 0.5

## 2015-08-07 MED ORDER — ALBUTEROL SULFATE (2.5 MG/3ML) 0.083% IN NEBU
5.0000 mg | INHALATION_SOLUTION | Freq: Once | RESPIRATORY_TRACT | Status: AC
  Administered 2015-08-07: 5 mg via RESPIRATORY_TRACT
  Filled 2015-08-07: qty 6

## 2015-08-07 MED ORDER — SODIUM CHLORIDE 0.9 % IV SOLN
800.0000 mg/m2 | Freq: Once | INTRAVENOUS | Status: AC
  Administered 2015-08-07: 1558 mg via INTRAVENOUS
  Filled 2015-08-07: qty 40.98

## 2015-08-07 MED ORDER — HYDROMORPHONE HCL 1 MG/ML IJ SOLN
0.5000 mg | Freq: Once | INTRAMUSCULAR | Status: AC
  Administered 2015-08-07: 0.5 mg via INTRAVENOUS
  Filled 2015-08-07: qty 1

## 2015-08-07 MED ORDER — METHYLPREDNISOLONE SODIUM SUCC 125 MG IJ SOLR
80.0000 mg | Freq: Three times a day (TID) | INTRAMUSCULAR | Status: DC
  Administered 2015-08-07: 80 mg via INTRAVENOUS
  Filled 2015-08-07: qty 2

## 2015-08-07 MED ORDER — HYDROMORPHONE HCL 1 MG/ML IJ SOLN
0.5000 mg | INTRAMUSCULAR | Status: DC | PRN
  Administered 2015-08-07 – 2015-08-09 (×4): 0.5 mg via INTRAVENOUS
  Filled 2015-08-07 (×4): qty 1

## 2015-08-07 MED ORDER — OXYCODONE HCL 5 MG PO TABS
5.0000 mg | ORAL_TABLET | ORAL | Status: DC | PRN
  Administered 2015-08-08 – 2015-08-10 (×4): 5 mg via ORAL
  Filled 2015-08-07 (×4): qty 1

## 2015-08-07 MED ORDER — PROCHLORPERAZINE EDISYLATE 5 MG/ML IJ SOLN
INTRAMUSCULAR | Status: AC
  Filled 2015-08-07: qty 2

## 2015-08-07 MED ORDER — DIPHENHYDRAMINE HCL (SLEEP) 25 MG PO TABS: 50.0000 mg | ORAL_TABLET | Freq: Every evening | ORAL | Status: DC | PRN

## 2015-08-07 MED ORDER — SPIRONOLACTONE 25 MG PO TABS
25.0000 mg | ORAL_TABLET | Freq: Every morning | ORAL | Status: DC
  Administered 2015-08-09 – 2015-08-10 (×2): 25 mg via ORAL
  Filled 2015-08-07 (×3): qty 1

## 2015-08-07 MED ORDER — LORAZEPAM 1 MG PO TABS
1.0000 mg | ORAL_TABLET | Freq: Three times a day (TID) | ORAL | Status: DC | PRN
  Administered 2015-08-08 – 2015-08-09 (×2): 1 mg via ORAL
  Filled 2015-08-07 (×2): qty 1

## 2015-08-07 MED ORDER — GUAIFENESIN ER 600 MG PO TB12
600.0000 mg | ORAL_TABLET | Freq: Two times a day (BID) | ORAL | Status: DC
  Administered 2015-08-07 – 2015-08-10 (×5): 600 mg via ORAL
  Filled 2015-08-07 (×5): qty 1

## 2015-08-07 MED ORDER — HEPARIN SOD (PORK) LOCK FLUSH 100 UNIT/ML IV SOLN
500.0000 [IU] | Freq: Once | INTRAVENOUS | Status: AC | PRN
  Administered 2015-08-07: 500 [IU]
  Filled 2015-08-07: qty 5

## 2015-08-07 MED ORDER — PROCHLORPERAZINE EDISYLATE 5 MG/ML IJ SOLN
10.0000 mg | Freq: Once | INTRAMUSCULAR | Status: AC
  Administered 2015-08-07: 10 mg via INTRAVENOUS

## 2015-08-07 MED ORDER — SERTRALINE HCL 50 MG PO TABS
150.0000 mg | ORAL_TABLET | Freq: Every day | ORAL | Status: DC
  Administered 2015-08-07 – 2015-08-09 (×3): 150 mg via ORAL
  Filled 2015-08-07 (×3): qty 1
  Filled 2015-08-07: qty 3
  Filled 2015-08-07: qty 1

## 2015-08-07 MED ORDER — ACETAMINOPHEN 325 MG PO TABS: 650.0000 mg | ORAL_TABLET | Freq: Four times a day (QID) | ORAL | Status: DC | PRN

## 2015-08-07 MED ORDER — VITAMIN D 1000 UNITS PO TABS
1000.0000 [IU] | ORAL_TABLET | Freq: Every morning | ORAL | Status: DC
  Administered 2015-08-09 – 2015-08-10 (×2): 1000 [IU] via ORAL
  Filled 2015-08-07 (×3): qty 1

## 2015-08-07 MED ORDER — IPRATROPIUM BROMIDE 0.02 % IN SOLN
1.0000 mg | Freq: Once | RESPIRATORY_TRACT | Status: AC
  Administered 2015-08-07: 1 mg via RESPIRATORY_TRACT
  Filled 2015-08-07: qty 5

## 2015-08-07 MED ORDER — VITAMIN B-12 1000 MCG PO TABS
1000.0000 ug | ORAL_TABLET | Freq: Every morning | ORAL | Status: DC
  Administered 2015-08-09 – 2015-08-10 (×2): 1000 ug via ORAL
  Filled 2015-08-07 (×3): qty 1

## 2015-08-07 MED ORDER — METHYLPREDNISOLONE SODIUM SUCC 125 MG IJ SOLR
125.0000 mg | Freq: Once | INTRAMUSCULAR | Status: AC
  Administered 2015-08-07: 125 mg via INTRAVENOUS
  Filled 2015-08-07: qty 2

## 2015-08-07 MED ORDER — DIPHENHYDRAMINE HCL 50 MG PO CAPS: 50.0000 mg | ORAL_CAPSULE | Freq: Every evening | ORAL | Status: DC | PRN

## 2015-08-07 MED ORDER — MAGNESIUM GLUCONATE 500 MG PO TABS
500.0000 mg | ORAL_TABLET | Freq: Every day | ORAL | Status: DC
  Administered 2015-08-09 – 2015-08-10 (×2): 500 mg via ORAL
  Filled 2015-08-07 (×3): qty 1

## 2015-08-07 MED ORDER — SODIUM CHLORIDE 0.9 % IV SOLN
Freq: Once | INTRAVENOUS | Status: AC
  Administered 2015-08-07: 15:00:00 via INTRAVENOUS

## 2015-08-07 MED ORDER — IPRATROPIUM BROMIDE 0.02 % IN SOLN
1.0000 mg | RESPIRATORY_TRACT | Status: DC
  Administered 2015-08-08: 1 mg via RESPIRATORY_TRACT
  Filled 2015-08-07 (×2): qty 5

## 2015-08-07 MED ORDER — VITAMIN C 500 MG PO TABS
500.0000 mg | ORAL_TABLET | Freq: Two times a day (BID) | ORAL | Status: DC
  Administered 2015-08-08 – 2015-08-10 (×5): 500 mg via ORAL
  Filled 2015-08-07 (×6): qty 1

## 2015-08-07 MED ORDER — BUDESONIDE-FORMOTEROL FUMARATE 160-4.5 MCG/ACT IN AERO
2.0000 | INHALATION_SPRAY | Freq: Two times a day (BID) | RESPIRATORY_TRACT | Status: DC
  Administered 2015-08-08 – 2015-08-10 (×6): 2 via RESPIRATORY_TRACT
  Filled 2015-08-07 (×2): qty 6

## 2015-08-07 MED ORDER — ATORVASTATIN CALCIUM 40 MG PO TABS
80.0000 mg | ORAL_TABLET | Freq: Every day | ORAL | Status: DC
  Administered 2015-08-08 – 2015-08-09 (×3): 80 mg via ORAL
  Filled 2015-08-07: qty 2
  Filled 2015-08-07: qty 1
  Filled 2015-08-07: qty 2
  Filled 2015-08-07: qty 1

## 2015-08-07 NOTE — Progress Notes (Signed)
Patient recently admitted from 10/06 to 10/07 observation for vocal chord swelling.  Patient presenting to ED with shortness of breath poor appetite and fatigue.  Patient placed on oxygen 8 liters via nasal cannula in ED.  Patient with pmhx of lung cancer.

## 2015-08-07 NOTE — Progress Notes (Signed)
PHARMACIST - PHYSICIAN COMMUNICATION  CONCERNING: P&T Medication Policy Regarding Oral Bisphosphonates  RECOMMENDATION: Your order for alendronate (Fosamax) has been discontinued at this time.  If the patient's post-hospital medical condition warrants safe use of this class of drugs, please resume the pre-hospital regimen upon discharge.  DESCRIPTION:  Alendronate (Fosamax) can cause severe esophageal erosions in patients who are unable to remain upright at least 30 minutes after taking this medication.   Since brief interruptions in therapy are thought to have minimal impact on bone mineral density, the [place] has established that bisphosphonate orders should be routinely discontinued during hospitalization.   To override this safety policy and permit administration of Fosamax in the hospital, prescribers must write "DO NOT HOLD" in the comments section when placing the order for this class of medications.  Thank you, Leone Haven, PharmD

## 2015-08-07 NOTE — H&P (Signed)
[facility] History and Physical  Janice Ball UXL:[id number] DOB: 02/12/1956 DOA: 08/07/2015  Referring physician: ED physician PCP: [name], NP  Specialists:   Chief Complaint: SOB  HPI: Janice Ball is a 59 y.o. female with PMH of recurrent non-small cell lung cancer currently on chemotherapy (follows with Dr. [name]), SVC syndrome, PE and DVT on Xarelto, COPD, type 2 diabetes mellitus, hypothyroidism and anxiety and left vocal cord paralysis with weak breathing for which she underwent left medialization thyroplasty on 10/4 by Dr. [name] (ENT), who presents with SOB.  Patient reports she started having worsening shortness of breath at about 3:30 PM. She has wheezing and cough. She states that she has chronic cough with brownish and red colored mucus production which has not changed. She also has chronic chest pain which has also not changed. Patient does not have abdominal pain, nausea, vomiting, diarrhea, symptoms of UTI, rashes, unilateral weakness, fever or chills. She reports that she has a chronic tachycardia with a normal heart rate about 120  In ED, patient was found to have prolonged QTc 601, negative troponin, WBC 8.7, temperature normal, tachycardia with heart rates between 120-140, electrolytes okay. CXR showed stable appearance of the chest with right hilar distortion and radiation changes as described. No acute findings or evidence of metastatic disease.   Where does patient live?   At home    Can patient participate in ADLs?   Some   Review of Systems:   General: no fevers, chills, no changes in body weight, has poor appetite, has fatigue HEENT: no blurry vision, hearing changes or sore throat Pulm: has dyspnea, coughing, wheezing CV: has chest pain, palpitations Abd: no nausea, vomiting, abdominal pain, diarrhea, constipation GU: no dysuria, burning on urination, increased urinary frequency, hematuria  Ext: no leg edema Neuro: no unilateral weakness,  numbness, or tingling, no vision change or hearing loss Skin: no rash MSK: No muscle spasm, no deformity, no limitation of range of movement in spin Heme: No easy bruising.  Travel history: No recent long distant travel.  Allergy:  Allergies  Allergen Reactions  . Carboplatin Rash    Skin test reaction  . Sulfonamide Derivatives Rash  . Codeine Other (See Comments)    hallucinations  . Dilaudid [Hydromorphone Hcl] Nausea And Vomiting and Other (See Comments)    Room started spinning.  Pt has been okay with low doses and nausea meds administered first  . Keflex [Cephalexin] Itching    Rash-arms, chest  . Penicillins Other (See Comments)    Has patient had a PCN reaction causing immediate rash, facial/tongue/throat swelling, SOB or lightheadedness with hypotension: unknown Has patient had a PCN reaction causing severe rash involving mucus membranes or skin necrosis: unknown Has patient had a PCN reaction that required hospitalization unknown Has patient had a PCN reaction occurring within the last 10 years: no If all of the above answers are "NO", then may proceed with Cephalosporin use.   . Vicodin [Hydrocodone-Acetaminophen] Rash    Past Medical History  Diagnosis Date  . Anxiety   . Hyperlipidemia   . Hypothyroidism   . COPD (chronic obstructive pulmonary disease) ([place])   . Arthritis     thumbs  . 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 Lung cancer ([place]) dx'd 05/2009  . Lung cancer ([place]) dx'd 09/2013    recurrence in LN  . Diabetes mellitus   . Pulmonary  embolism ([place])     'I've had a blood clot in my chest'  . DNR no code (do not resuscitate) 07/31/2015    Past Surgical History  Procedure Laterality Date  . Thyroidectomy, partial    . Tonsillectomy    . Tubal ligation    . Partial hysterectomy    . Tonsillectomy    . Laryngoplasty Left 07/22/2015    Procedure:  LEFT VOCAL CORD MEDIALIZATION;  Surgeon: Rozetta Nunnery, MD;  Location: [facility];  Service: ENT;  Laterality: Left;    Social History:  reports that she quit smoking about 9 months ago. Her smoking use included Cigarettes. She has a 22.5 pack-year smoking history. She has never used smokeless tobacco. She reports that she does not drink alcohol or use illicit drugs.  Family History:  Family History  Problem Relation Age of Onset  . Colon cancer Mother   . Cancer Mother     colon  . Diabetes type II Father      Prior to Admission medications   Medication Sig Start Date End Date Taking? Authorizing Provider  acetaminophen (TYLENOL) 325 MG tablet Take 650 mg by mouth every 6 (six) hours as needed for mild pain or headache.    Yes Historical Provider, MD  alendronate (FOSAMAX) 10 MG tablet Take 10 mg by mouth daily.  03/20/14  Yes Historical Provider, MD  atorvastatin (LIPITOR) 80 MG tablet TAKE 1 TABLET EVERY DAY AT 6 PM 04/27/15  Yes Historical Provider, MD  benzonatate (TESSALON) 200 MG capsule Take 1 capsule (200 mg total) by mouth 3 (three) times daily as needed for cough. 05/23/15  Yes Tammy S Parrett, NP  budesonide-formoterol (SYMBICORT) 160-4.5 MCG/ACT inhaler Inhale 2 puffs into the lungs 2 (two) times daily.   Yes Historical Provider, MD  cholecalciferol (VITAMIN D) 1000 UNITS tablet Take 1,000 Units by mouth every morning.    Yes Historical Provider, MD  [facility] ASPIRIN ADULT LOW DOSE 81 MG chewable tablet Chew 81 mg by mouth daily. 12/06/14  Yes Historical Provider, MD  cyanocobalamin 1000 MCG tablet Take 1,000 mcg by mouth every morning.    Yes Historical Provider, MD  diphenhydrAMINE (SOMINEX) 25 MG tablet Take 50 mg by mouth at bedtime as needed for itching or sleep.   Yes Historical Provider, MD  glimepiride (AMARYL) 2 MG tablet Take 2 tablets (4 mg total) by mouth daily with breakfast. Take higher dose (39mby mouth) while on prednisone. Once off prednisone go back  to home regimen of 2 mg by mouth daily. Patient taking differently: Take 2 mg by mouth daily with breakfast.  04/26/15  Yes OVelvet Bathe MD  guaiFENesin (MUCINEX) 600 MG 12 hr tablet Take 600 mg by mouth 2 (two) times daily.    Yes Historical Provider, MD  levothyroxine (SYNTHROID, LEVOTHROID) 137 MCG tablet Take 137 mcg by mouth daily.  01/15/15  Yes Historical Provider, MD  LORazepam (ATIVAN) 1 MG tablet Take 1 mg by mouth 3 (three) times daily as needed for anxiety.    Yes Historical Provider, MD  magnesium gluconate (MAGONATE) 500 MG tablet Take 500 mg by mouth daily.    Yes Historical Provider, MD  metFORMIN (GLUCOPHAGE) 1000 MG tablet Take 1,000 mg by mouth 2 (two) times daily with a meal.   Yes Historical Provider, MD  Multiple Vitamin (MULTIVITAMIN WITH MINERALS) TABS tablet Take 1 tablet by mouth every morning.   Yes Historical Provider, MD  oxyCODONE (OXY IR/ROXICODONE) 5 MG immediate release tablet  Take 1 tablet (5 mg total) by mouth every 4 (four) hours as needed for severe pain. 07/31/15  Yes Curt Bears, MD  PROAIR HFA 108 (90 BASE) MCG/ACT inhaler INHALE 2 PUFFS INTO THE LUNGS EVERY 6 HOURS AS NEEDED FOR WHEEZING OR SHORTNESS OF BREATH. 06/17/15  Yes Brand Males, MD  prochlorperazine (COMPAZINE) 10 MG tablet Take 1 tablet (10 mg total) by mouth every 6 (six) hours as needed for nausea or vomiting. 06/25/15  Yes Curt Bears, MD  rivaroxaban (XARELTO) 20 MG TABS tablet Take 1 tablet (20 mg total) by mouth daily with supper. 07/07/15  Yes Curt Bears, MD  sertraline (ZOLOFT) 100 MG tablet Take 150 mg by mouth at bedtime.    Yes Historical Provider, MD  spironolactone (ALDACTONE) 25 MG tablet Take 25 mg by mouth every morning. 05/27/15  Yes Historical Provider, MD  tiotropium (SPIRIVA) 18 MCG inhalation capsule Place 1 capsule (18 mcg total) into inhaler and inhale daily. 04/26/15  Yes Velvet Bathe, MD  vitamin C (ASCORBIC ACID) 500 MG tablet Take 500 mg by mouth 2 (two) times daily.    Yes Historical Provider, MD    Physical Exam: Filed Vitals:   08/07/15 2217 08/07/15 2349 08/08/15 0052 08/08/15 0207  BP: 142/62 131/65 141/77 147/76  Pulse: 130 122 119 127  Temp: 98.1 F (36.7 C)  98.1 F (36.7 C)   TempSrc: Oral  Oral   Resp: '22 18 19 16  '$ SpO2: 93% 91% 91% 90%   General: Not in acute distress HEENT:       Eyes: PERRL, EOMI, no scleral icterus.       ENT: No discharge from the ears and nose, no pharynx injection, no tonsillar enlargement.        Neck: No JVD, no bruit, no mass felt. Heme: No neck lymph node enlargement. Cardiac: S1/S2, RRR, tachycardia No murmurs, No gallops or rubs. Pulm: Decreased air movement bilaterally. Has wheezing both in expiratory and inspiratory phases. No inspiratory stridor. No rhonchi or rubs. Abd: Soft, nondistended, nontender, no rebound pain, no organomegaly, BS present. Ext: No pitting leg edema bilaterally. 2+DP/PT pulse bilaterally. Musculoskeletal: No joint deformities, No joint redness or warmth, no limitation of ROM in spin. Skin: No rashes.  Neuro: Alert, oriented X3, cranial nerves II-XII grossly intact, muscle strength 5/5 in all extremities, sensation to light touch intact.  Psych: Patient is not psychotic, no suicidal or hemocidal ideation.  Labs on Admission:  Basic Metabolic Panel:  Recent Labs Lab 08/07/15 1323 08/07/15 1951  NA 138 136  K 4.4 3.8  CL  --  101  CO2 24 25  GLUCOSE 286* 273*  BUN 8.0 9  CREATININE 0.7 0.78  CALCIUM 8.5 7.9*   Liver Function Tests:  Recent Labs Lab 08/07/15 1323  AST 13  ALT 9  ALKPHOS 95  BILITOT <0.30  PROT 6.7  ALBUMIN 2.9*   No results for input(s): LIPASE, AMYLASE in the last 168 hours. No results for input(s): AMMONIA in the last 168 hours. CBC:  Recent Labs Lab 08/07/15 1323 08/07/15 1951  WBC 9.0 8.7  NEUTROABS 7.2* 7.5  HGB 10.8* 10.4*  HCT 35.0 33.6*  MCV 83.5 83.0  PLT 195 202   Cardiac Enzymes:  Recent Labs Lab 08/07/15 1951   TROPONINI <0.03    BNP (last 3 results)  Recent Labs  11/03/14 1222 11/30/14 1450 04/20/15 0340  BNP 22.1 29.9 32.5    ProBNP (last 3 results) No results for input(s): PROBNP in the  last 8760 hours.  CBG: No results for input(s): GLUCAP in the last 168 hours.  Radiological Exams on Admission: Dg Chest Port 1 View  08/07/2015  CLINICAL DATA:  Shortness breath for 2 days with cough. History of chronic bronchitis, diabetes and lung cancer. EXAM: PORTABLE CHEST 1 VIEW COMPARISON:  CT 06/24/2015.  Radiographs 04/20/2015. FINDINGS: 1938 hours. There is stable volume loss in the upper right hemithorax with radiation changes extending superiorly from the hilum. Right IJ Port-A-Cath is unchanged in position, tip at the upper right atrial level. SVC stent appears unchanged. The heart size is stable. The left lung is clear. There is no pleural effusion. The bones appear stable with a mild scoliosis. IMPRESSION: Stable appearance of the chest with right hilar distortion and radiation changes as described. No acute findings or evidence of metastatic disease. Electronically Signed   By: Richardean Sale M.D.   On: 08/07/2015 19:49    EKG: Independently reviewed.  Abnormal findings: QTC 601, tachycardia    Assessment/Plan Principal Problem:   Acute on chronic respiratory failure (HCC) Active Problems:   Primary cancer of right upper lobe of lung (HCC)   Hypothyroidism   DM type 2 (diabetes mellitus, type 2) (HCC)   HLD (hyperlipidemia)   Anxiety state   Osteoporosis   COPD exacerbation (HCC)   Sinus tachycardia (HCC)   DVT (deep venous thrombosis) (HCC)   Unilateral vocal cord paralysis   Long term current use of anticoagulant therapy   Chronic diastolic (congestive) heart failure (HCC)   SOB (shortness of breath)   Acute on chronic respiratory distress: Likely due to combination of COPD exacerbation and possible complication from previous vocal cord paralysis and s/p ocal cord  medialization thyroplasty. Patient does not have inspiratory stridor. I consulted ENT, Dr. [name] who did surgery for pt on 10/4 and knew this pt well. He thought that this is likely due to complication from previous vocal cord surgery. Pt can be safely observed in SDU and he will see pt in the AM.  -will admit to SDU -Nebulizers: scheduled Atravent and prn xopenex -Solu-Medrol 80 mg tid -Mucinex for cough  -continue low Tessalon, Symbicort, Spiriva  DM-II: Last A1c 6.7 on 04/21/15, well controled. Patient is taking metformin and Amaryl at home -SSI  Sinus tachycardia [facility]): Patient has chronic sinus tachycardia with baseline heart rate at about 120. Her heart rate is up to140 on admission, which responded to the breathing treatment in ED. Current heart rate is 120-130.  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 Currently on systemic chemotherapy. -Follows with Dr. [name].  chronic diastolic (congestive) heart failure ([place]): 2-D echo 11/04/14 showed EF of 55-60% with grade 1 diastolic dysfunction. Patient does not have leg edema. CHF is compensated. -continue spironolactone -check BNP  Hypothyroidism: Last TSH was 14.052 on 01/19/15, her Synthroid dose was adjusted. -Continue home Synthroid -Check TSH  HLD: Last LDL was 95 on 04/21/15 -Continue home medications: Depo -Check FLP  Depression and anxiety: Stable, no suicidal or homicidal ideations. -Continue home medications: Ativan and Zoloft  DVT ppx: On Xarelto Code Status: DNR Family Communication:     Yes, patient's  daughter  at bed side Disposition Plan: Admit to inpatient   Date of  Service 08/08/2015    Ivor Costa [facility] Pager [phone]  If 7PM-7AM, please contact night-coverage [email or web address] Password [email or web address] 08/08/2015, 3:13 AM

## 2015-08-07 NOTE — Patient Instructions (Signed)
[facility] Discharge Instructions for Patients Receiving Chemotherapy  Today you received the following chemotherapy agents gemzar  To help prevent nausea and vomiting after your treatment, we encourage you to take your nausea medication   If you develop nausea and vomiting that is not controlled by your nausea medication, call the clinic.   BELOW ARE SYMPTOMS THAT SHOULD BE REPORTED IMMEDIATELY:  *FEVER GREATER THAN 100.5 F  *CHILLS WITH OR WITHOUT FEVER  NAUSEA AND VOMITING THAT IS NOT CONTROLLED WITH YOUR NAUSEA MEDICATION  *UNUSUAL SHORTNESS OF BREATH  *UNUSUAL BRUISING OR BLEEDING  TENDERNESS IN MOUTH AND THROAT WITH OR WITHOUT PRESENCE OF ULCERS  *URINARY PROBLEMS  *BOWEL PROBLEMS  UNUSUAL RASH Items with * indicate a potential emergency and should be followed up as soon as possible.  Feel free to call the clinic you have any questions or concerns. The clinic phone number is [phone].  Please show the [facility] at check-in to the Emergency Department and triage nurse.

## 2015-08-07 NOTE — ED Notes (Signed)
Per EMS- Patient c/o SOB/respiratory distress. Patient has a history of lung cancer. Patien treceived a chemo treatment today. Patient states  Increasingly having SOB over the past 2 days. Patient was given duonebs x2 and Solumedrol 125 mg IV prior to the arrival to  he ED.

## 2015-08-07 NOTE — ED Provider Notes (Signed)
CSN: [id number]     Arrival date & time 08/07/15  1857 History   First MD Initiated Contact with Patient 08/07/15 1914     Chief Complaint  Patient presents with  . Shortness of Breath     (Consider location/radiation/quality/duration/timing/severity/associated sxs/prior Treatment) Patient is a 59 y.o. female presenting with shortness of breath.  Shortness of Breath Severity:  Severe Onset quality:  Gradual Duration:  4 hours Timing:  Constant Progression:  Worsening Chronicity:  Recurrent Context comment:  Had chemo this morning, but developed shortness of breath this afternoon. Relieved by:  Nothing Worsened by:  Exertion Ineffective treatments:  None tried Associated symptoms: chest pain, cough and sputum production (brown)   Associated symptoms: no fever     Past Medical History  Diagnosis Date  . Anxiety   . Hyperlipidemia   . Hypothyroidism   . COPD (chronic obstructive pulmonary disease) ([place])   . Arthritis     thumbs  . 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 Lung cancer ([place]) dx'd 05/2009  . Lung cancer ([place]) dx'd 09/2013    recurrence in LN  . Diabetes mellitus   . Pulmonary embolism ([place])     'I've had a blood clot in my chest'  . DNR no code (do not resuscitate) 07/31/2015   Past Surgical History  Procedure Laterality Date  . Thyroidectomy, partial    . Tonsillectomy    . Tubal ligation    . Partial hysterectomy    . Tonsillectomy    . Laryngoplasty Left 07/22/2015    Procedure: LEFT VOCAL CORD MEDIALIZATION;  Surgeon: Rozetta Nunnery, MD;  Location: [place] [facility];  Service: ENT;  Laterality: Left;   Family History  Problem Relation Age of Onset  . Colon cancer Mother   . Cancer Mother     colon  . Diabetes type II Father    Social History  Substance Use Topics  . Smoking status: Former Smoker -- 0.50 packs/day for 45 years   Types: Cigarettes    Quit date: 11/03/2014  . Smokeless tobacco: Never Used     Comment: pt. started back smoking 10/18/2012, half a pack a day  . Alcohol Use: No   OB History    No data available     Review of Systems  Constitutional: Negative for fever.  Respiratory: Positive for cough, sputum production (brown) and shortness of breath.   Cardiovascular: Positive for chest pain.  All other systems reviewed and are negative.     Allergies  Carboplatin; Sulfonamide derivatives; Codeine; Dilaudid; Keflex; Penicillins; and Vicodin  Home Medications   Prior to Admission medications   Medication Sig Start Date End Date Taking? Authorizing Provider  acetaminophen (TYLENOL) 325 MG tablet Take 650 mg by mouth every 6 (six) hours as needed for mild pain or headache.    Yes Historical Provider, MD  alendronate (FOSAMAX) 10 MG tablet Take 10 mg by mouth daily.  03/20/14  Yes Historical Provider, MD  atorvastatin (LIPITOR) 80 MG tablet TAKE 1 TABLET EVERY DAY AT 6 PM 04/27/15  Yes Historical Provider, MD  benzonatate (TESSALON) 200 MG capsule Take 1 capsule (200 mg total) by mouth 3 (three) times daily as needed for cough. 05/23/15  Yes Tammy S Parrett, NP  budesonide-formoterol (SYMBICORT) 160-4.5 MCG/ACT inhaler Inhale 2 puffs into the lungs 2 (two) times daily.   Yes Historical Provider, MD  cholecalciferol (  VITAMIN D) 1000 UNITS tablet Take 1,000 Units by mouth every morning.    Yes Historical Provider, MD  [facility] ASPIRIN ADULT LOW DOSE 81 MG chewable tablet Chew 81 mg by mouth daily. 12/06/14  Yes Historical Provider, MD  cyanocobalamin 1000 MCG tablet Take 1,000 mcg by mouth every morning.    Yes Historical Provider, MD  diphenhydrAMINE (SOMINEX) 25 MG tablet Take 50 mg by mouth at bedtime as needed for itching or sleep.   Yes Historical Provider, MD  glimepiride (AMARYL) 2 MG tablet Take 2 tablets (4 mg total) by mouth daily with breakfast. Take higher dose (17mby mouth) while on prednisone. Once  off prednisone go back to home regimen of 2 mg by mouth daily. Patient taking differently: Take 2 mg by mouth daily with breakfast.  04/26/15  Yes OVelvet Bathe MD  guaiFENesin (MUCINEX) 600 MG 12 hr tablet Take 600 mg by mouth 2 (two) times daily.    Yes Historical Provider, MD  levothyroxine (SYNTHROID, LEVOTHROID) 137 MCG tablet Take 137 mcg by mouth daily.  01/15/15  Yes Historical Provider, MD  LORazepam (ATIVAN) 1 MG tablet Take 1 mg by mouth 3 (three) times daily as needed for anxiety.    Yes Historical Provider, MD  magnesium gluconate (MAGONATE) 500 MG tablet Take 500 mg by mouth daily.    Yes Historical Provider, MD  metFORMIN (GLUCOPHAGE) 1000 MG tablet Take 1,000 mg by mouth 2 (two) times daily with a meal.   Yes Historical Provider, MD  Multiple Vitamin (MULTIVITAMIN WITH MINERALS) TABS tablet Take 1 tablet by mouth every morning.   Yes Historical Provider, MD  oxyCODONE (OXY IR/ROXICODONE) 5 MG immediate release tablet Take 1 tablet (5 mg total) by mouth every 4 (four) hours as needed for severe pain. 07/31/15  Yes MCurt Bears MD  PROAIR HFA 108 (90 BASE) MCG/ACT inhaler INHALE 2 PUFFS INTO THE LUNGS EVERY 6 HOURS AS NEEDED FOR WHEEZING OR SHORTNESS OF BREATH. 06/17/15  Yes MBrand Males MD  prochlorperazine (COMPAZINE) 10 MG tablet Take 1 tablet (10 mg total) by mouth every 6 (six) hours as needed for nausea or vomiting. 06/25/15  Yes MCurt Bears MD  rivaroxaban (XARELTO) 20 MG TABS tablet Take 1 tablet (20 mg total) by mouth daily with supper. 07/07/15  Yes MCurt Bears MD  sertraline (ZOLOFT) 100 MG tablet Take 150 mg by mouth at bedtime.    Yes Historical Provider, MD  spironolactone (ALDACTONE) 25 MG tablet Take 25 mg by mouth every morning. 05/27/15  Yes Historical Provider, MD  tiotropium (SPIRIVA) 18 MCG inhalation capsule Place 1 capsule (18 mcg total) into inhaler and inhale daily. 04/26/15  Yes OVelvet Bathe MD  vitamin C (ASCORBIC ACID) 500 MG tablet Take 500 mg by  mouth 2 (two) times daily.   Yes Historical Provider, MD   BP 148/79 mmHg  Pulse 142  Temp(Src) 98.1 F (36.7 C) (Oral)  Resp 26  SpO2 94% Physical Exam  Constitutional: She is oriented to person, place, and time. She appears well-developed and well-nourished. No distress.  HENT:  Head: Normocephalic and atraumatic.  Mouth/Throat: Oropharynx is clear and moist.  Eyes: Conjunctivae are normal. Pupils are equal, round, and reactive to light. No scleral icterus.  Neck: Neck supple.  Cardiovascular: Regular rhythm, normal heart sounds and intact distal pulses.  Tachycardia present.   No murmur heard. Pulmonary/Chest: Breath sounds normal. No stridor. Tachypnea noted. She is in respiratory distress (mild, speaking in short sentences).  Decreased air movement, diffuse wheezing  and coarse breath sounds bilaterally  Abdominal: Soft. Bowel sounds are normal. She exhibits no distension. There is no tenderness.  Musculoskeletal: Normal range of motion.  Neurological: She is alert and oriented to person, place, and time.  Skin: Skin is warm and dry. No rash noted.  Psychiatric: She has a normal mood and affect. Her behavior is normal.  Nursing note and vitals reviewed.   ED Course  Procedures (including critical care time) Labs Review Labs Reviewed  CBC WITH DIFFERENTIAL/PLATELET - Abnormal; Notable for the following:    Hemoglobin 10.4 (*)    HCT 33.6 (*)    MCH 25.7 (*)    RDW 16.7 (*)    All other components within normal limits  BASIC METABOLIC PANEL - Abnormal; Notable for the following:    Glucose, Bld 273 (*)    Calcium 7.9 (*)    All other components within normal limits  TROPONIN I    Imaging Review Dg Chest Port 1 View  08/07/2015  CLINICAL DATA:  Shortness breath for 2 days with cough. History of chronic bronchitis, diabetes and lung cancer. EXAM: PORTABLE CHEST 1 VIEW COMPARISON:  CT 06/24/2015.  Radiographs 04/20/2015. FINDINGS: 1938 hours. There is stable volume loss  in the upper right hemithorax with radiation changes extending superiorly from the hilum. Right IJ Port-A-Cath is unchanged in position, tip at the upper right atrial level. SVC stent appears unchanged. The heart size is stable. The left lung is clear. There is no pleural effusion. The bones appear stable with a mild scoliosis. IMPRESSION: Stable appearance of the chest with right hilar distortion and radiation changes as described. No acute findings or evidence of metastatic disease. Electronically Signed   By: Richardean Sale M.D.   On: 08/07/2015 19:49   I have personally reviewed and evaluated these images and lab results as part of my medical decision-making.   EKG Interpretation None      EKG - sinus tachy, rate 138, RAD, QTc 605, no ST/T abnormalities.  MDM   Final diagnoses:  COPD with acute exacerbation ([place])      59 yo female with hx of cancer and COPD.  Presented with respiratory distress.  Presentation most consistent with COPD.  PE considered due to her tachycardia, but she is compliant with her Xarelto.  Plan treatment for COPD and admit to medicine.    Serita Grit, MD 08/09/15 [id number]

## 2015-08-08 DIAGNOSIS — J9621 Acute and chronic respiratory failure with hypoxia: Secondary | ICD-10-CM

## 2015-08-08 DIAGNOSIS — I5032 Chronic diastolic (congestive) heart failure: Secondary | ICD-10-CM

## 2015-08-08 DIAGNOSIS — J441 Chronic obstructive pulmonary disease with (acute) exacerbation: Principal | ICD-10-CM

## 2015-08-08 DIAGNOSIS — F411 Generalized anxiety disorder: Secondary | ICD-10-CM

## 2015-08-08 LAB — COMPREHENSIVE METABOLIC PANEL
ALK PHOS: 81 U/L (ref 38–126)
ALT: 11 U/L — AB (ref 14–54)
AST: 21 U/L (ref 15–41)
Albumin: 3.1 g/dL — ABNORMAL LOW (ref 3.5–5.0)
Anion gap: 10 (ref 5–15)
BILIRUBIN TOTAL: 0.4 mg/dL (ref 0.3–1.2)
BUN: 12 mg/dL (ref 6–20)
CALCIUM: 8 mg/dL — AB (ref 8.9–10.3)
CO2: 26 mmol/L (ref 22–32)
CREATININE: 0.64 mg/dL (ref 0.44–1.00)
Chloride: 99 mmol/L — ABNORMAL LOW (ref 101–111)
Glucose, Bld: 331 mg/dL — ABNORMAL HIGH (ref 65–99)
Potassium: 4.6 mmol/L (ref 3.5–5.1)
SODIUM: 135 mmol/L (ref 135–145)
Total Protein: 6.6 g/dL (ref 6.5–8.1)

## 2015-08-08 LAB — CBC
HEMATOCRIT: 32.4 % — AB (ref 36.0–46.0)
HEMOGLOBIN: 10 g/dL — AB (ref 12.0–15.0)
MCH: 25.7 pg — AB (ref 26.0–34.0)
MCHC: 30.9 g/dL (ref 30.0–36.0)
MCV: 83.3 fL (ref 78.0–100.0)
Platelets: 212 10*3/uL (ref 150–400)
RBC: 3.89 MIL/uL (ref 3.87–5.11)
RDW: 16.6 % — ABNORMAL HIGH (ref 11.5–15.5)
WBC: 6.9 10*3/uL (ref 4.0–10.5)

## 2015-08-08 LAB — MRSA PCR SCREENING: MRSA BY PCR: NEGATIVE

## 2015-08-08 LAB — GLUCOSE, CAPILLARY
GLUCOSE-CAPILLARY: 207 mg/dL — AB (ref 65–99)
Glucose-Capillary: 276 mg/dL — ABNORMAL HIGH (ref 65–99)

## 2015-08-08 LAB — TSH: TSH: 2.2 u[IU]/mL (ref 0.350–4.500)

## 2015-08-08 LAB — CBG MONITORING, ED: Glucose-Capillary: 280 mg/dL — ABNORMAL HIGH (ref 65–99)

## 2015-08-08 LAB — BRAIN NATRIURETIC PEPTIDE: B Natriuretic Peptide: 37.2 pg/mL (ref 0.0–100.0)

## 2015-08-08 MED ORDER — LEVALBUTEROL HCL 0.63 MG/3ML IN NEBU
0.6300 mg | INHALATION_SOLUTION | RESPIRATORY_TRACT | Status: DC | PRN
  Administered 2015-08-09 (×3): 0.63 mg via RESPIRATORY_TRACT
  Filled 2015-08-08 (×3): qty 3

## 2015-08-08 MED ORDER — ONDANSETRON HCL 4 MG/2ML IJ SOLN
4.0000 mg | Freq: Four times a day (QID) | INTRAMUSCULAR | Status: DC | PRN
  Administered 2015-08-08: 4 mg via INTRAVENOUS
  Filled 2015-08-08: qty 2

## 2015-08-08 MED ORDER — LEVALBUTEROL HCL 1.25 MG/0.5ML IN NEBU
1.2500 mg | INHALATION_SOLUTION | Freq: Three times a day (TID) | RESPIRATORY_TRACT | Status: DC
  Administered 2015-08-08 – 2015-08-10 (×5): 1.25 mg via RESPIRATORY_TRACT
  Filled 2015-08-08 (×11): qty 0.5

## 2015-08-08 MED ORDER — INSULIN ASPART 100 UNIT/ML ~~LOC~~ SOLN
0.0000 [IU] | Freq: Three times a day (TID) | SUBCUTANEOUS | Status: DC
Start: 2015-08-08 — End: 2015-08-10
  Administered 2015-08-08 (×2): 5 [IU] via SUBCUTANEOUS
  Administered 2015-08-08: 3 [IU] via SUBCUTANEOUS
  Administered 2015-08-09 (×2): 5 [IU] via SUBCUTANEOUS
  Administered 2015-08-09: 9 [IU] via SUBCUTANEOUS
  Administered 2015-08-10: 7 [IU] via SUBCUTANEOUS
  Administered 2015-08-10: 3 [IU] via SUBCUTANEOUS
  Filled 2015-08-08: qty 1

## 2015-08-08 MED ORDER — ONDANSETRON HCL 4 MG/2ML IJ SOLN
4.0000 mg | Freq: Once | INTRAMUSCULAR | Status: AC
  Administered 2015-08-08: 4 mg via INTRAVENOUS
  Filled 2015-08-08: qty 2

## 2015-08-08 MED ORDER — METHYLPREDNISOLONE SODIUM SUCC 125 MG IJ SOLR
60.0000 mg | Freq: Two times a day (BID) | INTRAMUSCULAR | Status: DC
  Administered 2015-08-08 – 2015-08-09 (×2): 60 mg via INTRAVENOUS
  Filled 2015-08-08 (×2): qty 2

## 2015-08-08 MED ORDER — LEVOFLOXACIN 500 MG PO TABS
500.0000 mg | ORAL_TABLET | Freq: Every day | ORAL | Status: DC
  Administered 2015-08-08 – 2015-08-10 (×3): 500 mg via ORAL
  Filled 2015-08-08 (×3): qty 1

## 2015-08-08 NOTE — Progress Notes (Addendum)
Inpatient Diabetes Program Recommendations  AACE/ADA: New Consensus Statement on Inpatient Glycemic Control (2015)  Target Ranges:  Prepandial:   less than 140 mg/dL      Peak postprandial:   less than 180 mg/dL (1-2 hours)      Critically ill patients:  140 - 180 mg/dL    Results for YARENIS, CERINO (MRN [id number]) as of 08/08/2015 12:35  Ref. Range 08/08/2015 08:01 08/08/2015 12:14  Glucose-Capillary Latest Ref Range: 65-99 mg/dL 280 (H) 276 (H)     Admit with: SOB  History: DM  Home DM Meds: Amaryl 2 mg daily       Metformin 1000 mg bid  Current Insulin Orders: Novolog Sensitive SSI (0-9 units) TID AC     -Note patient currently receiving IV Solumedrol 80 mg Q8 hours.  -Glucose levels >250 mg/dl.  Novolog SSI started this AM, however, patient may likely need more insulin coverage while receiving high dose IV steroids.    MD- Please consider the following in-hospital insulin adjustments if patient continues to have glucose levels >250 mg/dl:  1. Start basal insulin- Recommend Lantus 10 units daily (0.15 units/kg dosing)  2. Increase Novolog SSI to Moderate scale (0-15 units) TID AC + HS    --Will follow patient during hospitalization--  Wyn Quaker RN, MSN, CDE Diabetes Coordinator Inpatient Glycemic Control Team Team Pager: [phone] (8a-5p)

## 2015-08-08 NOTE — Consult Note (Signed)
ER Consult Note Patient presented to [facility] with respiratory difficulty. She has history of lung cancer, COPD and left vocal cord paralysis and is s/p left vocal cord medialization procedure performed 3 weeks ago. At bedside she has no stridor , O2 sat 95%. She does have congested breathing and congested cough Neck exam soft without swelling, incision site well healed FOL  Nasopharynx clear           Base of tongue and epiglottis clear           Vocal cords with out swelling, left vocal cord medialized without movement. Right vocal cord normal with normal mobility. Glottis without obstruction. Subglottis clear.  IMP:  Upper airway is not obstructed. Breathing difficulty appears to be more pulmonary in nature. Recommend pulmonary consult. She can follow up in my office as an outpatient .

## 2015-08-08 NOTE — Progress Notes (Signed)
[facility] PROGRESS NOTE  Janice Ball XBM:[id number] DOB: 11/02/1955 DOA: 08/07/2015 PCP: [name], NP  Assessment/Plan: COPD exacerbation/Resp distress -component of anxiety too -CXR unchanged, some wheezing noted on admission -s/p EVal by Dr.Newman and FOL: unremarkable vocal cords and epiglottis -continue IV solumedrol today, nebs add levaquin -continue Tessalon, Symbicort, Spiriva  DM-II: Last A1c 6.7 on 04/21/15, well controled.  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 Currently on systemic chemotherapy, last Chemo few days ago -Follows with Dr. [name].  chronic diastolic (congestive) heart failure ([place]): 2-D echo 11/04/14 showed EF of 55-60% with grade 1 diastolic dysfunction. Patient does not have leg edema. CHF is compensated. -continue spironolactone  Hypothyroidism: Last TSH was 14.052 on 01/19/15, her Synthroid dose was adjusted. -Continue home Synthroid  HLD: Last LDL was 95 on 04/21/15 -Continue home medications  Depression and anxiety: Stable, no suicidal or homicidal ideations. -Continue home medications: Ativan and Zoloft  DVT ppx: On Xarelto Code Status: DNR Family Communication:  daughterin law at bed side Disposition Plan: home tomorrow if stable  HPI/Subjective: Breathing better  Objective: Filed Vitals:   08/08/15 1200  BP:   Pulse:   Temp: 98.5 F (36.9 C)  Resp:    No intake or output data in the 24 hours ending 08/08/15 1237 There were no vitals filed for this visit.  Exam:   General:  AAOx3  Cardiovascular: S1S2/RRR  Respiratory: poor air movt  Abdomen:  soft, Nt, BS present  Musculoskeletal:no edema c/c   Data Reviewed: Basic Metabolic Panel:  Recent Labs Lab 08/07/15 1323 08/07/15 1951 08/08/15 0543  NA 138 136 135  K 4.4 3.8 4.6  CL  --  101 99*  CO2 '24 25 26  '$ GLUCOSE 286* 273* 331*  BUN 8.0 9 12  CREATININE 0.7 0.78 0.64  CALCIUM 8.5 7.9* 8.0*   Liver Function Tests:  Recent Labs Lab 08/07/15 1323 08/08/15 0543  AST 13 21  ALT 9 11*  ALKPHOS 95 81  BILITOT <0.30 0.4  PROT 6.7 6.6  ALBUMIN 2.9* 3.1*   No results for input(s): LIPASE, AMYLASE in the last 168 hours. No results for input(s): AMMONIA in the last 168 hours. CBC:  Recent Labs Lab 08/07/15 1323 08/07/15 1951 08/08/15 0543  WBC 9.0 8.7 6.9  NEUTROABS 7.2* 7.5  --   HGB 10.8* 10.4* 10.0*  HCT 35.0 33.6* 32.4*  MCV 83.5 83.0 83.3  PLT 195 202 212   Cardiac Enzymes:  Recent Labs Lab 08/07/15 1951  TROPONINI <0.03   BNP (last 3 results)  Recent Labs  11/30/14 1450 04/20/15 0340 08/08/15 0543  BNP 29.9 32.5 37.2    ProBNP (last 3 results) No results for input(s): PROBNP in the last 8760 hours.  CBG:  Recent Labs Lab 08/08/15 0801 08/08/15 1214  GLUCAP 280* 276*    No results found for this or any previous visit (from the past 240 hour(s)).   Studies: Dg Chest Port 1 View  08/07/2015  CLINICAL DATA:  Shortness breath for 2 days with cough. History of chronic bronchitis, diabetes and lung cancer. EXAM: PORTABLE CHEST 1 VIEW COMPARISON:  CT 06/24/2015.  Radiographs 04/20/2015. FINDINGS: 1938 hours. There is stable volume loss  in the upper right hemithorax with radiation changes extending superiorly from the hilum. Right IJ Port-A-Cath is unchanged in position, tip at the upper right atrial level. SVC stent appears unchanged. The heart size is stable. The left lung is clear. There is no pleural effusion. The bones appear stable with a mild scoliosis. IMPRESSION: Stable appearance of the chest with right hilar distortion and  radiation changes as described. No acute findings or evidence of metastatic disease. Electronically Signed   By: Richardean Sale M.D.   On: 08/07/2015 19:49    Scheduled Meds: . aspirin  81 mg Oral Daily  . atorvastatin  80 mg Oral q1800  . budesonide-formoterol  2 puff Inhalation BID  . cholecalciferol  1,000 Units Oral q morning - 10a  . guaiFENesin  600 mg Oral BID  . insulin aspart  0-9 Units Subcutaneous TID WC  . levalbuterol  1.25 mg Nebulization TID  . levothyroxine  137 mcg Oral QAC breakfast  . magnesium gluconate  500 mg Oral Daily  . methylPREDNISolone (SOLU-MEDROL) injection  80 mg Intravenous TID  . multivitamin with minerals  1 tablet Oral q morning - 10a  . rivaroxaban  20 mg Oral Q supper  . sertraline  150 mg Oral QHS  . sodium chloride  3 mL Intravenous Q12H  . spironolactone  25 mg Oral q morning - 10a  . tiotropium  18 mcg Inhalation Daily  . cyanocobalamin  1,000 mcg Oral q morning - 10a  . vitamin C  500 mg Oral BID   Continuous Infusions:  Antibiotics Given (last 72 hours)    None      Principal Problem:   Acute on chronic respiratory failure (HCC) Active Problems:   Primary cancer of right upper lobe of lung (HCC)   Hypothyroidism   DM type 2 (diabetes mellitus, type 2) ([place])   HLD (hyperlipidemia)   Anxiety state   Osteoporosis   COPD exacerbation (HCC)   Sinus tachycardia (HCC)   DVT (deep venous thrombosis) (HCC)   Unilateral vocal cord paralysis   Long term current use of anticoagulant therapy   Chronic diastolic (congestive) heart failure (HCC)   SOB (shortness of breath)    Time spent:4mn    [facility] Pager [phone] If 7PM-7AM, please contact night-coverage at [email or web address], password [email or web address]10/21/2016, 12:37 PM  LOS: 1 day

## 2015-08-09 DIAGNOSIS — J9620 Acute and chronic respiratory failure, unspecified whether with hypoxia or hypercapnia: Secondary | ICD-10-CM

## 2015-08-09 LAB — CBC
HCT: 31.8 % — ABNORMAL LOW (ref 36.0–46.0)
HEMOGLOBIN: 9.8 g/dL — AB (ref 12.0–15.0)
MCH: 26 pg (ref 26.0–34.0)
MCHC: 30.8 g/dL (ref 30.0–36.0)
MCV: 84.4 fL (ref 78.0–100.0)
PLATELETS: 215 10*3/uL (ref 150–400)
RBC: 3.77 MIL/uL — AB (ref 3.87–5.11)
RDW: 16.8 % — ABNORMAL HIGH (ref 11.5–15.5)
WBC: 10.1 10*3/uL (ref 4.0–10.5)

## 2015-08-09 LAB — BASIC METABOLIC PANEL
Anion gap: 6 (ref 5–15)
BUN: 19 mg/dL (ref 6–20)
CHLORIDE: 101 mmol/L (ref 101–111)
CO2: 30 mmol/L (ref 22–32)
CREATININE: 0.66 mg/dL (ref 0.44–1.00)
Calcium: 8.2 mg/dL — ABNORMAL LOW (ref 8.9–10.3)
GFR calc non Af Amer: >60 mL/min (ref >60)
Glucose, Bld: 334 mg/dL — ABNORMAL HIGH (ref 65–99)
Potassium: 4.9 mmol/L (ref 3.5–5.1)
SODIUM: 137 mmol/L (ref 135–145)

## 2015-08-09 LAB — GLUCOSE, CAPILLARY
GLUCOSE-CAPILLARY: 223 mg/dL — AB (ref 65–99)
GLUCOSE-CAPILLARY: 284 mg/dL — AB (ref 65–99)
GLUCOSE-CAPILLARY: 325 mg/dL — AB (ref 65–99)
Glucose-Capillary: 279 mg/dL — ABNORMAL HIGH (ref 65–99)
Glucose-Capillary: 379 mg/dL — ABNORMAL HIGH (ref 65–99)

## 2015-08-09 MED ORDER — PREDNISONE 50 MG PO TABS
50.0000 mg | ORAL_TABLET | Freq: Every day | ORAL | Status: DC
  Administered 2015-08-10: 50 mg via ORAL
  Filled 2015-08-09: qty 1

## 2015-08-09 MED ORDER — METHYLPREDNISOLONE SODIUM SUCC 40 MG IJ SOLR
40.0000 mg | Freq: Two times a day (BID) | INTRAMUSCULAR | Status: AC
  Administered 2015-08-09: 40 mg via INTRAVENOUS
  Filled 2015-08-09: qty 1

## 2015-08-09 MED ORDER — INSULIN GLARGINE 100 UNIT/ML ~~LOC~~ SOLN
10.0000 [IU] | Freq: Every day | SUBCUTANEOUS | Status: DC
  Filled 2015-08-09: qty 0.1

## 2015-08-09 NOTE — Evaluation (Signed)
Physical Therapy Evaluation Patient Details Name: Janice Ball MRN: [id number] DOB: 02/09/1956 Today's Date: 08/09/2015   History of Present Illness  Janice Ball is a 59 y.o. female with PMH of recurrent non-small cell lung cancer currently on chemotherapy (follows with Dr. [name]), SVC syndrome, PE and DVT on Xarelto, COPD, type 2 diabetes mellitus, hypothyroidism and anxiety and left vocal cord paralysis with weak breathing for which she underwent left medialization thyroplasty on 10/4 by Dr. [name] (ENT), who presents with SOB on 08/07/15.  Clinical Impression  Patient tolerated short distance ambulation. Patient will benefit from PT to address problems ;isted below and determine need for home oxygen.    Follow Up Recommendations Home health PT;Supervision - Intermittent    Equipment Recommendations  None recommended by PT    Recommendations for Other Services       Precautions / Restrictions Precautions Precautions: Fall Precaution Comments: monitor HR AND SATS      Mobility  Bed Mobility Overal bed mobility: Modified Independent                Transfers Overall transfer level: Needs assistance Equipment used: Rolling walker (2 wheeled) Transfers: Sit to/from Stand Sit to Stand: Min assist            Ambulation/Gait Ambulation/Gait assistance: Min assist;+2 safety/equipment Ambulation Distance (Feet): 25 Feet Assistive device: Rolling walker (2 wheeled) Gait Pattern/deviations: Step-through pattern Gait velocity:  sats 0n 2 liters > 90%   General Gait Details: cues for safety, increased  SOB. HR 117  Stairs            Wheelchair Mobility    Modified Rankin (Stroke Patients Only)       Balance Overall balance assessment: History of Falls;Needs assistance Sitting-balance support: Feet supported;No upper extremity supported Sitting balance-Leahy Scale: Good     Standing balance support: During functional activity;Bilateral upper  extremity supported Standing balance-Leahy Scale: Fair Standing balance comment: reports loss of balance upon standing at times if she does not sit a minute.                             Pertinent Vitals/Pain Pain Assessment: No/denies pain    Home Living Family/patient expects to be discharged to:: Private residence Living Arrangements: Children Available Help at Discharge: Family Type of Home: House Home Access: Level entry     Home Layout: One level Home Equipment: Environmental consultant - 2 wheels;Bedside commode;Wheelchair - manual      Prior Function Level of Independence: Independent with assistive device(s)         Comments: Family assistance with house work     Journalist, newspaper        Extremity/Trunk Assessment   Upper Extremity Assessment: RUE deficits/detail;LUE deficits/detail RUE Deficits / Details: tremulous     LUE Deficits / Details: tremulous   Lower Extremity Assessment: Generalized weakness      Cervical / Trunk Assessment: Normal  Communication      Cognition Arousal/Alertness: Awake/alert Behavior During Therapy: WFL for tasks assessed/performed Overall Cognitive Status: Within Functional Limits for tasks assessed                      General Comments      Exercises        Assessment/Plan    PT Assessment Patient needs continued PT services  PT Diagnosis Difficulty walking;Generalized weakness   PT Problem List Decreased strength;Decreased balance;Decreased mobility;Decreased knowledge of use  of DME;Decreased knowledge of precautions;Decreased safety awareness;Cardiopulmonary status limiting activity  PT Treatment Interventions DME instruction;Gait training;Functional mobility training;Therapeutic activities;Therapeutic exercise;Patient/family education   PT Goals (Current goals can be found in the Care Plan section) Acute Rehab PT Goals Patient Stated Goal: to walk and get better PT Goal Formulation: With patient Time For  Goal Achievement: 08/23/15 Potential to Achieve Goals: Good    Frequency Min 3X/week   Barriers to discharge        Co-evaluation               End of Session Equipment Utilized During Treatment: Oxygen Activity Tolerance: Patient limited by fatigue Patient left: in chair;with call bell/phone within reach;with chair alarm set;with nursing/sitter in room Nurse Communication: Mobility status         Time: 7185-5015 PT Time Calculation (min) (ACUTE ONLY): 15 min   Charges:   PT Evaluation $Initial PT Evaluation Tier I: 1 Procedure     PT G CodesClaretha Cooper 08/09/2015, 12:26 PM Tresa Endo PT [phone]

## 2015-08-09 NOTE — Progress Notes (Signed)
[facility] PROGRESS NOTE  Janice Ball QHU:[id number] DOB: 1956-01-10 DOA: 08/07/2015 PCP: [name], NP  Assessment/Plan: COPD exacerbation/Resp distress -component of anxiety too -CXR unchanged, some wheezing noted on admission -s/p EVal by Dr.Newman and FOL: unremarkable vocal cords and epiglottis -cut down IV solumedrol today, change to Prednisone from tomorrow, continue nebs, levaquin -continue Tessalon, Symbicort, Spiriva  DM-II: Last A1c 6.7 on 04/21/15,   -on metformin and Amaryl at home -SSI, worse with steroids, give lantus x1 now  Sinus tachycardia [facility]): Patient has chronic sinus tachycardia with baseline heart rate at about 120 -stable now, monitor  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 Currently on systemic chemotherapy, last Chemo few days prior -Follows with Dr. [name].  chronic diastolic (congestive) heart failure ([place]): 2-D echo 11/04/14 showed EF of 55-60% with grade 1 diastolic dysfunction. Patient does not have leg edema. CHF is compensated. -continue spironolactone  Hypothyroidism: Last TSH was 14.052 on 01/19/15, her Synthroid dose was adjusted. -Continue home Synthroid  HLD: Last LDL was 95 on 04/21/15 -Continue home medications  Depression and anxiety: Stable, -Continue home medications: Ativan and Zoloft  DVT ppx: On Xarelto Code Status: DNR Family Communication: none at bed side Disposition Plan: home tomorrow if stable, [place] to tele  HPI/Subjective: hoarse, coughing, breathing ok  Objective: Filed Vitals:   08/09/15 1200  BP: 123/57  Pulse: 110  Temp: 98.4 F (36.9 C)  Resp: 20   No intake or output data in the 24 hours ending 08/09/15 1335 Filed Weights   08/09/15 0521  Weight: 71.9 kg (158 lb 8.2 oz)     Exam:   General:  AAOx3  Cardiovascular: S1S2/RRR  Respiratory: poor air movt  Abdomen: soft, Nt, BS present  Musculoskeletal:no edema c/c   Data Reviewed: Basic Metabolic Panel:  Recent Labs Lab 08/07/15 1323 08/07/15 1951 08/08/15 0543 08/09/15 0400  NA 138 136 135 137  K 4.4 3.8 4.6 4.9  CL  --  101 99* 101  CO2 '24 25 26 30  '$ GLUCOSE 286* 273* 331* 334*  BUN 8.0 '9 12 19  '$ CREATININE 0.7 0.78 0.64 0.66  CALCIUM 8.5 7.9* 8.0* 8.2*   Liver Function Tests:  Recent Labs Lab 08/07/15 1323 08/08/15 0543  AST 13 21  ALT 9 11*  ALKPHOS 95 81  BILITOT <0.30 0.4  PROT 6.7 6.6  ALBUMIN 2.9* 3.1*   No results for input(s): LIPASE, AMYLASE in the last 168 hours. No results for input(s): AMMONIA in the last 168 hours. CBC:  Recent Labs Lab 08/07/15 1323 08/07/15 1951 08/08/15 0543 08/09/15 0400  WBC 9.0 8.7 6.9 10.1  NEUTROABS 7.2* 7.5  --   --   HGB 10.8* 10.4* 10.0* 9.8*  HCT 35.0 33.6* 32.4* 31.8*  MCV 83.5 83.0 83.3 84.4  PLT 195 202 212 215   Cardiac Enzymes:  Recent Labs Lab 08/07/15 1951  TROPONINI <0.03   BNP (last 3 results)  Recent Labs  11/30/14 1450 04/20/15 0340 08/08/15 0543  BNP 29.9 32.5 37.2    ProBNP (last 3 results) No results for input(s): PROBNP in the last 8760 hours.  CBG:  Recent Labs Lab 08/08/15 1214 08/08/15 1711 08/08/15 2158 08/09/15 0753 08/09/15 1138  GLUCAP 276* 207* 223* 284* 279*    Recent Results (from the past 240 hour(s))  MRSA PCR Screening     Status:  None   Collection Time: 08/08/15 11:40 AM  Result Value Ref Range Status   MRSA by PCR NEGATIVE NEGATIVE Final    Comment:        The GeneXpert MRSA Assay (FDA approved for NASAL specimens only), is one component of a comprehensive MRSA colonization surveillance program. It is not intended to diagnose MRSA infection nor to guide or monitor treatment for MRSA infections.      Studies: Dg Chest Port 1 View  08/07/2015  CLINICAL  DATA:  Shortness breath for 2 days with cough. History of chronic bronchitis, diabetes and lung cancer. EXAM: PORTABLE CHEST 1 VIEW COMPARISON:  CT 06/24/2015.  Radiographs 04/20/2015. FINDINGS: 1938 hours. There is stable volume loss in the upper right hemithorax with radiation changes extending superiorly from the hilum. Right IJ Port-A-Cath is unchanged in position, tip at the upper right atrial level. SVC stent appears unchanged. The heart size is stable. The left lung is clear. There is no pleural effusion. The bones appear stable with a mild scoliosis. IMPRESSION: Stable appearance of the chest with right hilar distortion and radiation changes as described. No acute findings or evidence of metastatic disease. Electronically Signed   By: Richardean Sale M.D.   On: 08/07/2015 19:49    Scheduled Meds: . aspirin  81 mg Oral Daily  . atorvastatin  80 mg Oral q1800  . budesonide-formoterol  2 puff Inhalation BID  . cholecalciferol  1,000 Units Oral q morning - 10a  . guaiFENesin  600 mg Oral BID  . insulin aspart  0-9 Units Subcutaneous TID WC  . levalbuterol  1.25 mg Nebulization TID  . levofloxacin  500 mg Oral Daily  . levothyroxine  137 mcg Oral QAC breakfast  . magnesium gluconate  500 mg Oral Daily  . methylPREDNISolone (SOLU-MEDROL) injection  40 mg Intravenous Q12H  . multivitamin with minerals  1 tablet Oral q morning - 10a  . [START ON 08/10/2015] predniSONE  50 mg Oral Q breakfast  . rivaroxaban  20 mg Oral Q supper  . sertraline  150 mg Oral QHS  . sodium chloride  3 mL Intravenous Q12H  . spironolactone  25 mg Oral q morning - 10a  . tiotropium  18 mcg Inhalation Daily  . cyanocobalamin  1,000 mcg Oral q morning - 10a  . vitamin C  500 mg Oral BID   Continuous Infusions:  Antibiotics Given (last 72 hours)    Date/Time Action Medication Dose   08/08/15 1700 Given   levofloxacin (LEVAQUIN) tablet 500 mg 500 mg   08/09/15 3825 Given   levofloxacin (LEVAQUIN) tablet 500 mg 500  mg      Principal Problem:   Acute on chronic respiratory failure (HCC) Active Problems:   Primary cancer of right upper lobe of lung ([place])   Hypothyroidism   DM type 2 (diabetes mellitus, type 2) ([place])   HLD (hyperlipidemia)   Anxiety state   Osteoporosis   COPD exacerbation ([place])   Sinus tachycardia ([place])   DVT (deep venous thrombosis) (HCC)   Unilateral vocal cord paralysis   Long term current use of anticoagulant therapy   Chronic diastolic (congestive) heart failure (HCC)   SOB (shortness of breath)    Time spent:80mn    [facility] Pager [phone] If 7PM-7AM, please contact night-coverage at [email or web address], password [email or web address]10/22/2016, 1:35 PM  LOS: 2 days

## 2015-08-10 DIAGNOSIS — R000 Tachycardia, unspecified: Secondary | ICD-10-CM

## 2015-08-10 DIAGNOSIS — C3411 Malignant neoplasm of upper lobe, right bronchus or lung: Secondary | ICD-10-CM

## 2015-08-10 LAB — GLUCOSE, CAPILLARY
GLUCOSE-CAPILLARY: 310 mg/dL — AB (ref 65–99)
GLUCOSE-CAPILLARY: 245 mg/dL — AB (ref 65–99)

## 2015-08-10 LAB — BASIC METABOLIC PANEL
ANION GAP: 8 (ref 5–15)
BUN: 18 mg/dL (ref 6–20)
CALCIUM: 8.2 mg/dL — AB (ref 8.9–10.3)
CO2: 30 mmol/L (ref 22–32)
Chloride: 99 mmol/L — ABNORMAL LOW (ref 101–111)
Creatinine, Ser: 0.68 mg/dL (ref 0.44–1.00)
GFR calc Af Amer: >60 mL/min (ref >60)
Glucose, Bld: 353 mg/dL — ABNORMAL HIGH (ref 65–99)
POTASSIUM: 4.6 mmol/L (ref 3.5–5.1)
SODIUM: 137 mmol/L (ref 135–145)

## 2015-08-10 LAB — CBC
HCT: 31.6 % — ABNORMAL LOW (ref 36.0–46.0)
Hemoglobin: 9.8 g/dL — ABNORMAL LOW (ref 12.0–15.0)
MCH: 26 pg (ref 26.0–34.0)
MCHC: 31 g/dL (ref 30.0–36.0)
MCV: 83.8 fL (ref 78.0–100.0)
PLATELETS: 207 10*3/uL (ref 150–400)
RBC: 3.77 MIL/uL — AB (ref 3.87–5.11)
RDW: 16.6 % — ABNORMAL HIGH (ref 11.5–15.5)
WBC: 9.3 10*3/uL (ref 4.0–10.5)

## 2015-08-10 MED ORDER — GLIMEPIRIDE 2 MG PO TABS: 4.0000 mg | ORAL_TABLET | Freq: Every day | ORAL | Status: AC

## 2015-08-10 MED ORDER — LEVOFLOXACIN 500 MG PO TABS: 500.0000 mg | ORAL_TABLET | Freq: Every day | ORAL | Status: DC

## 2015-08-10 MED ORDER — GUAIFENESIN ER 600 MG PO TB12: 600.0000 mg | ORAL_TABLET | Freq: Two times a day (BID) | ORAL | Status: DC

## 2015-08-10 MED ORDER — INSULIN GLARGINE 100 UNIT/ML ~~LOC~~ SOLN
15.0000 [IU] | Freq: Every day | SUBCUTANEOUS | Status: AC
  Administered 2015-08-10: 15 [IU] via SUBCUTANEOUS
  Filled 2015-08-10: qty 0.15

## 2015-08-10 MED ORDER — PREDNISONE 20 MG PO TABS: ORAL_TABLET | ORAL | Status: DC

## 2015-08-10 NOTE — Evaluation (Signed)
Occupational Therapy Evaluation Patient Details Name: Janice Ball MRN: [id number] DOB: [birth year] Today's Date: 08/10/2015    History of Present Illness Janice Ball is a 59 y.o. female with PMH of recurrent non-small cell lung cancer currently on chemotherapy (follows with Dr. [name]), SVC syndrome, PE and DVT on Xarelto, COPD, type 2 diabetes mellitus, hypothyroidism and anxiety and left vocal cord paralysis with weak breathing for which she underwent left medialization thyroplasty on 10/4 by Dr. [name] (ENT), who presents with SOB   Clinical Impression   Pt was admitted for the above.  She will benefit from skilled OT in acute to increase safety and independence with adls.  Goals in acute are for supervision level.    Follow Up Recommendations  No OT follow up;Supervision - Intermittent (during mobility/adls)    Equipment Recommendations  None recommended by OT    Recommendations for Other Services       Precautions / Restrictions Precautions Precautions: Fall Precaution Comments: monitor HR AND SATS Restrictions Weight Bearing Restrictions: No      Mobility Bed Mobility Overal bed mobility: Modified Independent             General bed mobility comments: HOB raised  Transfers   Equipment used: Rolling walker (2 wheeled) Transfers: Sit to/from Stand Sit to Stand: Min guard         General transfer comment: for safety    Balance                                            ADL Overall ADL's : Needs assistance/impaired Eating/Feeding: Independent                       Toilet Transfer: Min guard;Ambulation (around bed to recliner)             General ADL Comments: performed ADL from EOB, sit to stand with set up and min guard for sit to stand.   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  Dyspnea 2/4 during ADLs; pt rested.  HR up to 112.       Vision     Perception     Praxis       Pertinent Vitals/Pain Pain Assessment: No/denies pain     Hand Dominance     Extremity/Trunk Assessment Upper Extremity Assessment Upper Extremity Assessment: Overall WFL for tasks assessed (arms with tremors but didn't interfere with function)           Communication Communication Communication: No difficulties   Cognition Arousal/Alertness: Awake/alert Behavior During Therapy: WFL for tasks assessed/performed Overall Cognitive Status: Within Functional Limits for tasks assessed                     General Comments       Exercises       Shoulder Instructions      Home Living Family/patient expects to be discharged to:: Private residence Living Arrangements: Children Available Help at Discharge: Family Type of Home: House             Bathroom Shower/Tub: Tub/shower unit Shower/tub characteristics: Curtain Biochemist, clinical: Standard     Home Equipment: Environmental consultant - 2 wheels;Bedside commode;Wheelchair - manual   Additional Comments: daughter is getting pt a tub transfer bench      Prior Functioning/Environment Level of Independence: Independent  with assistive device(s)        Comments: Family assistance with house work    OT Diagnosis: Generalized weakness   OT Problem List: Decreased strength;Cardiopulmonary status limiting activity;Impaired balance (sitting and/or standing)   OT Treatment/Interventions: Self-care/ADL training;Energy conservation;DME and/or AE instruction;Patient/family education;Balance training    OT Goals(Current goals can be found in the care plan section) Acute Rehab OT Goals Patient Stated Goal: get back to baseline OT Goal Formulation: With patient Time For Goal Achievement: 08/17/15 Potential to Achieve Goals: Good ADL Goals Pt Will Transfer to Toilet: with supervision;ambulating;bedside commode Additional ADL Goal #1: pt will gather clothes at supervision level  OT Frequency: Min 2X/week   Barriers to D/C:             Co-evaluation              End of Session    Activity Tolerance: Patient tolerated treatment well Patient left: in chair;with call bell/phone within reach;with chair alarm set   Time: 9012-2241 OT Time Calculation (min): 24 min Charges:  OT General Charges $OT Visit: 1 Procedure OT Evaluation $Initial OT Evaluation Tier I: 1 Procedure G-Codes:    Darrik Richman 08/23/15, 9:21 AM  Lesle Chris, OTR/L [phone] 08/23/15

## 2015-08-10 NOTE — Discharge Summary (Signed)
Physician Discharge Summary  Janice Ball WUJ:[id number] DOB: 30-May-1956 DOA: 08/07/2015  PCP: [name], NP  Admit date: 08/07/2015 Discharge date: 08/10/2015  Time spent:45 minutes  Recommendations for Outpatient Follow-up:  1. Dr.Mohammed next week, scheduled for chemotherapy 2. Home Health RN  Discharge Diagnoses:  Principal Problem:   Acute on chronic respiratory failure (HCC) Active Problems:   Primary cancer of right upper lobe of lung (HCC)   Hypothyroidism   DM type 2 (diabetes mellitus, type 2) (HCC)   HLD (hyperlipidemia)   Anxiety state   Osteoporosis   COPD exacerbation (HCC)   Sinus tachycardia (HCC)   DVT (deep venous thrombosis) (HCC)   Unilateral vocal cord paralysis   Long term current use of anticoagulant therapy   Chronic diastolic (congestive) heart failure (HCC)   SOB (shortness of breath)   Discharge Condition: stable  Diet recommendation: Low sodium, heart healthy  Filed Weights   08/09/15 0521  Weight: 71.9 kg (158 lb 8.2 oz)    History of present illness:  Chief Complaint: SOB HPI: Janice Ball is a 59 y.o. female with PMH of recurrent non-small cell lung cancer currently on chemotherapy (followed with Dr. [name]), SVC syndrome, PE and DVT on Xarelto, COPD, type 2 diabetes mellitus, hypothyroidism and anxiety and left vocal cord paralysis for which she underwent left medialization thyroplasty on 10/4 by Dr. [name] (ENT), presented with SOB. Patient reported having worsening shortness of breath starting 10/20 afternoon with wheezing and cough.   Hospital Course:  COPD exacerbation/Resp distress -suspected to have a component of anxiety too -CXR unchanged, some wheezing noted on admission -Evaluated by Dr.Newman and FOL: unremarkable vocal cords and epiglottis -Treated with IV solumedrol, nebs, Levaquin, clinically improved and transitioned to Prednisone taper, weaned off O2 -continued on Tessalon, Symbicort, Spiriva  DM-II: Last  A1c 6.7 on 04/21/15,  -on metformin and Amaryl at home -SSI, worse with steroids, advised to double dose of Amaryl for 3days while on prednisone  Sinus tachycardia: Patient has chronic sinus tachycardia with baseline heart rate at about 120 -stable now, monitor  PE and Extensive DVT (deep venous thrombosis) of left upper extremity, status post thrombectomy -on Xarelto  Recurrent non-small cell lung cancer, squamous cell carcinoma diagnosed in 2010: Multiple systemic chemotherapy, chemoradiation. Currently on systemic chemotherapy, last Chemo few days prior -Followed by Dr. [name], FU on Thursday for chemo  chronic diastolic (congestive) heart failure ([place]): 2-D echo 11/04/14 showed EF of 55-60% with grade 1 diastolic dysfunction.  -CHF is compensated. -continue spironolactone  Hypothyroidism: Last TSH was 14.052 on 01/19/15, her Synthroid dose was adjusted. -Continue home Synthroid  HLD: Last LDL was 95 on 04/21/15 -Continue home medications  Depression and anxiety: Stable, -Continue home medications: Ativan and Zoloft   Consultations:  ENT: Dr.Newman  Discharge Exam: Filed Vitals:   08/10/15 1228  BP: 122/73  Pulse: 113  Temp: 98.5 F (36.9 C)  Resp: 18    General: AAOx3, hoarse Cardiovascular: S1S2/RRR Respiratory: Improved air movement, no wheezes  Discharge Instructions   Discharge Instructions    Diet - low sodium heart healthy    Complete by:  As directed      Diet Carb Modified    Complete by:  As directed      Increase activity slowly    Complete by:  As directed           Current Discharge Medication List    START taking these medications   Details  levofloxacin (LEVAQUIN) 500 MG tablet Take  1 tablet (500 mg total) by mouth daily. For 3days Qty: 3 tablet, Refills: 0    predniSONE (DELTASONE) 20 MG tablet Take '40mg'$  for 2days then '20mg'$  for 1day then STOP Qty: 6 tablet, Refills: 0      CONTINUE these medications which have CHANGED   Details   glimepiride (AMARYL) 2 MG tablet Take 2 tablets (4 mg total) by mouth daily with breakfast. Take higher dose (36mby mouth) while on prednisone. Once off prednisone go back to home regimen of 2 mg by mouth daily. Qty: 30 tablet, Refills: 0    guaiFENesin (MUCINEX) 600 MG 12 hr tablet Take 1 tablet (600 mg total) by mouth 2 (two) times daily. Qty: 30 tablet, Refills: 0      CONTINUE these medications which have NOT CHANGED   Details  acetaminophen (TYLENOL) 325 MG tablet Take 650 mg by mouth every 6 (six) hours as needed for mild pain or headache.     alendronate (FOSAMAX) 10 MG tablet Take 10 mg by mouth daily.    Associated Diagnoses: Malignant neoplasm of bronchus and lung, unspecified site    atorvastatin (LIPITOR) 80 MG tablet TAKE 1 TABLET EVERY DAY AT 6 PM Refills: 0    benzonatate (TESSALON) 200 MG capsule Take 1 capsule (200 mg total) by mouth 3 (three) times daily as needed for cough. Qty: 30 capsule, Refills: 1    budesonide-formoterol (SYMBICORT) 160-4.5 MCG/ACT inhaler Inhale 2 puffs into the lungs 2 (two) times daily.    cholecalciferol (VITAMIN D) 1000 UNITS tablet Take 1,000 Units by mouth every morning.     [employer] ASPIRIN ADULT LOW DOSE 81 MG chewable tablet Chew 81 mg by mouth daily. Refills: 0    cyanocobalamin 1000 MCG tablet Take 1,000 mcg by mouth every morning.     diphenhydrAMINE (SOMINEX) 25 MG tablet Take 50 mg by mouth at bedtime as needed for itching or sleep.    levothyroxine (SYNTHROID, LEVOTHROID) 137 MCG tablet Take 137 mcg by mouth daily.     LORazepam (ATIVAN) 1 MG tablet Take 1 mg by mouth 3 (three) times daily as needed for anxiety.     magnesium gluconate (MAGONATE) 500 MG tablet Take 500 mg by mouth daily.     metFORMIN (GLUCOPHAGE) 1000 MG tablet Take 1,000 mg by mouth 2 (two) times daily with a meal.    Multiple Vitamin (MULTIVITAMIN WITH MINERALS) TABS tablet Take 1 tablet by mouth every morning.    oxyCODONE (OXY IR/ROXICODONE) 5 MG  immediate release tablet Take 1 tablet (5 mg total) by mouth every 4 (four) hours as needed for severe pain. Qty: 60 tablet, Refills: 0   Associated Diagnoses: Primary cancer of right upper lobe of lung (HCC)    PROAIR HFA 108 (90 BASE) MCG/ACT inhaler INHALE 2 PUFFS INTO THE LUNGS EVERY 6 HOURS AS NEEDED FOR WHEEZING OR SHORTNESS OF BREATH. Qty: 1 Inhaler, Refills: 2    prochlorperazine (COMPAZINE) 10 MG tablet Take 1 tablet (10 mg total) by mouth every 6 (six) hours as needed for nausea or vomiting. Qty: 30 tablet, Refills: 1    rivaroxaban (XARELTO) 20 MG TABS tablet Take 1 tablet (20 mg total) by mouth daily with supper. Qty: 30 tablet, Refills: 3   Associated Diagnoses: Primary cancer of right upper lobe of lung ([place]; SVC syndrome; DVT (deep venous thrombosis), unspecified laterality    sertraline (ZOLOFT) 100 MG tablet Take 150 mg by mouth at bedtime.     spironolactone (ALDACTONE) 25 MG tablet Take  25 mg by mouth every morning. Refills: 4    tiotropium (SPIRIVA) 18 MCG inhalation capsule Place 1 capsule (18 mcg total) into inhaler and inhale daily. Qty: 30 capsule, Refills: 12    vitamin C (ASCORBIC ACID) 500 MG tablet Take 500 mg by mouth 2 (two) times daily.       Allergies  Allergen Reactions  . Carboplatin Rash    Skin test reaction  . Sulfonamide Derivatives Rash  . Codeine Other (See Comments)    hallucinations  . Dilaudid [Hydromorphone Hcl] Nausea And Vomiting and Other (See Comments)    Room started spinning.  Pt has been okay with low doses and nausea meds administered first  . Keflex [Cephalexin] Itching    Rash-arms, chest  . Penicillins Other (See Comments)    Has patient had a PCN reaction causing immediate rash, facial/tongue/throat swelling, SOB or lightheadedness with hypotension: unknown Has patient had a PCN reaction causing severe rash involving mucus membranes or skin necrosis: unknown Has patient had a PCN reaction that required hospitalization  unknown Has patient had a PCN reaction occurring within the last 10 years: no If all of the above answers are "NO", then may proceed with Cephalosporin use.   . Vicodin [Hydrocodone-Acetaminophen] Rash   Follow-up Information    Follow up with [name], NP. Schedule an appointment as soon as possible for a visit in 1 week.   Specialty:  Nurse Practitioner   Contact information:   [address] [place] [address] [phone]        The results of significant diagnostics from this hospitalization (including imaging, microbiology, ancillary and laboratory) are listed below for reference.    Significant Diagnostic Studies: Dg Chest Port 1 View  08/07/2015  CLINICAL DATA:  Shortness breath for 2 days with cough. History of chronic bronchitis, diabetes and lung cancer. EXAM: PORTABLE CHEST 1 VIEW COMPARISON:  CT 06/24/2015.  Radiographs 04/20/2015. FINDINGS: 1938 hours. There is stable volume loss in the upper right hemithorax with radiation changes extending superiorly from the hilum. Right IJ Port-A-Cath is unchanged in position, tip at the upper right atrial level. SVC stent appears unchanged. The heart size is stable. The left lung is clear. There is no pleural effusion. The bones appear stable with a mild scoliosis. IMPRESSION: Stable appearance of the chest with right hilar distortion and radiation changes as described. No acute findings or evidence of metastatic disease. Electronically Signed   By: Richardean Sale M.D.   On: 08/07/2015 19:49    Microbiology: Recent Results (from the past 240 hour(s))  MRSA PCR Screening     Status: None   Collection Time: 08/08/15 11:40 AM  Result Value Ref Range Status   MRSA by PCR NEGATIVE NEGATIVE Final    Comment:        The GeneXpert MRSA Assay (FDA approved for NASAL specimens only), is one component of a comprehensive MRSA colonization surveillance program. It is not intended to diagnose MRSA infection nor to guide  or monitor treatment for MRSA infections.      Labs: Basic Metabolic Panel:  Recent Labs Lab 08/07/15 1323 08/07/15 1951 08/08/15 0543 08/09/15 0400 08/10/15 0527  NA 138 136 135 137 137  K 4.4 3.8 4.6 4.9 4.6  CL  --  101 99* 101 99*  CO2 '24 25 26 30 30  '$ GLUCOSE 286* 273* 331* 334* 353*  BUN 8.0 '9 12 19 18  '$ CREATININE 0.7 0.78 0.64 0.66 0.68  CALCIUM 8.5 7.9* 8.0* 8.2* 8.2*  Liver Function Tests:  Recent Labs Lab 08/07/15 1323 08/08/15 0543  AST 13 21  ALT 9 11*  ALKPHOS 95 81  BILITOT <0.30 0.4  PROT 6.7 6.6  ALBUMIN 2.9* 3.1*   No results for input(s): LIPASE, AMYLASE in the last 168 hours. No results for input(s): AMMONIA in the last 168 hours. CBC:  Recent Labs Lab 08/07/15 1323 08/07/15 1951 08/08/15 0543 08/09/15 0400 08/10/15 0527  WBC 9.0 8.7 6.9 10.1 9.3  NEUTROABS 7.2* 7.5  --   --   --   HGB 10.8* 10.4* 10.0* 9.8* 9.8*  HCT 35.0 33.6* 32.4* 31.8* 31.6*  MCV 83.5 83.0 83.3 84.4 83.8  PLT 195 202 212 215 207   Cardiac Enzymes:  Recent Labs Lab 08/07/15 1951  TROPONINI <0.03   BNP: BNP (last 3 results)  Recent Labs  11/30/14 1450 04/20/15 0340 08/08/15 0543  BNP 29.9 32.5 37.2    ProBNP (last 3 results) No results for input(s): PROBNP in the last 8760 hours.  CBG:  Recent Labs Lab 08/09/15 0753 08/09/15 1138 08/09/15 1742 08/09/15 2133 08/10/15 0757  GLUCAP 284* 279* 379* 325* 310*       Signed:  Weber Monnier  [facility] 08/10/2015, 1:33 PM

## 2015-08-10 NOTE — Care Management Important Message (Signed)
Important Message  Patient Details  Name: Janice Ball MRN: [id number] Date of Birth: Jun 12, 1956   Medicare Important Message Given:  Yes-second notification given    Erenest Rasher, RN 08/10/2015, 12:28 PM

## 2015-08-10 NOTE — Care Management Note (Signed)
Case Management Note  Patient Details  Name: Janice Ball MRN: [id number] Date of Birth: March 24, 1956  Subjective/Objective:   COPD exacerbation/Resp distress,  Recurrent non-small cell lung cancer, DM             Action/Plan: NCM spoke to pt and gave permission to speak with dtr, Geroge Baseman. States she had [facility] in the past for [facility]. Pt states she does not need HHPT but agreeable to [facility] RN. She has RW at home. Dtr is at home all day to assist with her care. States she works from home. Contacted [facility] with new referral with start of care date 08/13/2015. Pt notified of soc date. Prefers [place] for [facility].   Expected Discharge Date:  08/10/2015            Expected Discharge Plan:  [place]  In-House Referral:     Discharge planning Services  CM Consult     HH Arranged:  RN [facility] Agency:  [facility]  Status of Service:  Completed, signed off  Medicare Important Message Given:  Yes-second notification given Date Medicare IM Given:    Medicare IM give by:    Date Additional Medicare IM Given:    Additional Medicare Important Message give by:     If discussed at [place] of Stay Meetings, dates discussed:    Additional Comments:  Erenest Rasher, RN 08/10/2015, 1:13 PM

## 2015-08-10 NOTE — Progress Notes (Signed)
Pt discharged to home. Discharge instructions given with dtr at bedside. prescriptions x 4 also given. No concerns voiced. Left unit in wheelchair pushed by nurse tech. Left in good condition. Vwilliams,rn.

## 2015-08-12 ENCOUNTER — Telehealth: Payer: Self-pay | Admitting: Internal Medicine

## 2015-08-12 NOTE — Telephone Encounter (Signed)
I called spoke with Estill Bamberg from [place]. Requesting HH order to see pt once a week x 8 weeks.  Also requesting to set pt up with a device called "the breather". It is very similar to an incentive spirometer and pt would use it multiple times a day, Per Estill Bamberg if you are needing further info on this, she can fax over info. Please advise MR thanks

## 2015-08-13 ENCOUNTER — Other Ambulatory Visit: Payer: Medicare Other

## 2015-08-14 ENCOUNTER — Other Ambulatory Visit: Payer: Self-pay

## 2015-08-14 ENCOUNTER — Other Ambulatory Visit (HOSPITAL_BASED_OUTPATIENT_CLINIC_OR_DEPARTMENT_OTHER): Payer: Medicare Other

## 2015-08-14 ENCOUNTER — Ambulatory Visit (HOSPITAL_BASED_OUTPATIENT_CLINIC_OR_DEPARTMENT_OTHER): Payer: Medicare Other

## 2015-08-14 ENCOUNTER — Ambulatory Visit (HOSPITAL_BASED_OUTPATIENT_CLINIC_OR_DEPARTMENT_OTHER): Payer: Medicare Other | Admitting: Nurse Practitioner

## 2015-08-14 ENCOUNTER — Other Ambulatory Visit: Payer: Medicare Other

## 2015-08-14 ENCOUNTER — Ambulatory Visit: Payer: Medicare Other

## 2015-08-14 VITALS — BP 144/74 | HR 127 | Temp 98.0°F | Resp 16

## 2015-08-14 DIAGNOSIS — Z86711 Personal history of pulmonary embolism: Secondary | ICD-10-CM

## 2015-08-14 DIAGNOSIS — C3411 Malignant neoplasm of upper lobe, right bronchus or lung: Secondary | ICD-10-CM

## 2015-08-14 DIAGNOSIS — Z452 Encounter for adjustment and management of vascular access device: Secondary | ICD-10-CM

## 2015-08-14 DIAGNOSIS — R0789 Other chest pain: Secondary | ICD-10-CM

## 2015-08-14 DIAGNOSIS — Z86718 Personal history of other venous thrombosis and embolism: Secondary | ICD-10-CM

## 2015-08-14 DIAGNOSIS — Z7901 Long term (current) use of anticoagulants: Secondary | ICD-10-CM

## 2015-08-14 LAB — CBC WITH DIFFERENTIAL/PLATELET
BASO%: 0.1 % (ref 0.0–2.0)
Basophils Absolute: 0 10*3/uL (ref 0.0–0.1)
EOS%: 0.5 % (ref 0.0–7.0)
Eosinophils Absolute: 0.1 10*3/uL (ref 0.0–0.5)
HCT: 35.2 % (ref 34.8–46.6)
HGB: 11.2 g/dL — ABNORMAL LOW (ref 11.6–15.9)
LYMPH%: 14.2 % (ref 14.0–49.7)
MCH: 25.7 pg (ref 25.1–34.0)
MCHC: 31.8 g/dL (ref 31.5–36.0)
MCV: 80.9 fL (ref 79.5–101.0)
MONO#: 0.6 10*3/uL (ref 0.1–0.9)
MONO%: 6.9 % (ref 0.0–14.0)
NEUT%: 78.3 % — ABNORMAL HIGH (ref 38.4–76.8)
NEUTROS ABS: 7.1 10*3/uL — AB (ref 1.5–6.5)
Platelets: 132 10*3/uL — ABNORMAL LOW (ref 145–400)
RBC: 4.35 10*6/uL (ref 3.70–5.45)
RDW: 16.4 % — ABNORMAL HIGH (ref 11.2–14.5)
WBC: 9.1 10*3/uL (ref 3.9–10.3)
lymph#: 1.3 10*3/uL (ref 0.9–3.3)

## 2015-08-14 LAB — COMPREHENSIVE METABOLIC PANEL (CC13)
ALK PHOS: 81 U/L (ref 40–150)
ALT: 28 U/L (ref 0–55)
AST: 24 U/L (ref 5–34)
Albumin: 3 g/dL — ABNORMAL LOW (ref 3.5–5.0)
Anion Gap: 13 mEq/L — ABNORMAL HIGH (ref 3–11)
BILIRUBIN TOTAL: 0.35 mg/dL (ref 0.20–1.20)
BUN: 11.9 mg/dL (ref 7.0–26.0)
CO2: 24 meq/L (ref 22–29)
CREATININE: 0.8 mg/dL (ref 0.6–1.1)
Calcium: 8.3 mg/dL — ABNORMAL LOW (ref 8.4–10.4)
Chloride: 100 mEq/L (ref 98–109)
EGFR: 79 mL/min/{1.73_m2} — AB (ref >90)
GLUCOSE: 316 mg/dL — AB (ref 70–140)
Potassium: 3.5 mEq/L (ref 3.5–5.1)
SODIUM: 137 meq/L (ref 136–145)
TOTAL PROTEIN: 6.3 g/dL — AB (ref 6.4–8.3)

## 2015-08-14 MED ORDER — SODIUM CHLORIDE 0.9 % IV SOLN
800.0000 mg/m2 | Freq: Once | INTRAVENOUS | Status: DC
  Filled 2015-08-14: qty 41

## 2015-08-14 MED ORDER — PROCHLORPERAZINE MALEATE 10 MG PO TABS: 10.0000 mg | ORAL_TABLET | Freq: Once | ORAL | Status: DC

## 2015-08-14 MED ORDER — HEPARIN SOD (PORK) LOCK FLUSH 100 UNIT/ML IV SOLN
500.0000 [IU] | Freq: Once | INTRAVENOUS | Status: AC | PRN
  Administered 2015-08-14: 500 [IU]
  Filled 2015-08-14: qty 5

## 2015-08-14 MED ORDER — SODIUM CHLORIDE 0.9 % IV SOLN: Freq: Once | INTRAVENOUS | Status: DC

## 2015-08-14 MED ORDER — SODIUM CHLORIDE 0.9 % IJ SOLN
10.0000 mL | INTRAMUSCULAR | Status: DC | PRN
  Administered 2015-08-14: 10 mL
  Filled 2015-08-14: qty 10

## 2015-08-14 MED ORDER — PROCHLORPERAZINE MALEATE 10 MG PO TABS
ORAL_TABLET | ORAL | Status: AC
  Filled 2015-08-14: qty 1

## 2015-08-14 NOTE — Progress Notes (Signed)
Pulse elevated to 127 pt c/o 3/10 chest pain.  Pt states she was discharged from hospital on Sunday.  Michel Harrow, NP notified.  EKG done.  Will hold gemzar today.  Pt to return in 1 week.

## 2015-08-14 NOTE — Telephone Encounter (Signed)
Called gave Janice Ball VO. Nothing further needed

## 2015-08-14 NOTE — Telephone Encounter (Signed)
That is fine 

## 2015-08-16 ENCOUNTER — Inpatient Hospital Stay (HOSPITAL_COMMUNITY)
Admission: EM | Admit: 2015-08-16 | Discharge: 2015-08-21 | DRG: 189 | Disposition: A | Discharge status: Home Health | Payer: Medicare Other | Attending: Internal Medicine | Admitting: Internal Medicine

## 2015-08-16 ENCOUNTER — Emergency Department (HOSPITAL_COMMUNITY): Payer: Medicare Other

## 2015-08-16 ENCOUNTER — Other Ambulatory Visit: Payer: Self-pay

## 2015-08-16 ENCOUNTER — Encounter (HOSPITAL_COMMUNITY): Payer: Self-pay

## 2015-08-16 DIAGNOSIS — E1140 Type 2 diabetes mellitus with diabetic neuropathy, unspecified: Secondary | ICD-10-CM

## 2015-08-16 DIAGNOSIS — D649 Anemia, unspecified: Secondary | ICD-10-CM | POA: Diagnosis present

## 2015-08-16 DIAGNOSIS — Z86718 Personal history of other venous thrombosis and embolism: Secondary | ICD-10-CM

## 2015-08-16 DIAGNOSIS — J9601 Acute respiratory failure with hypoxia: Principal | ICD-10-CM | POA: Diagnosis present

## 2015-08-16 DIAGNOSIS — Z87891 Personal history of nicotine dependence: Secondary | ICD-10-CM

## 2015-08-16 DIAGNOSIS — Z66 Do not resuscitate: Secondary | ICD-10-CM | POA: Diagnosis present

## 2015-08-16 DIAGNOSIS — Z7901 Long term (current) use of anticoagulants: Secondary | ICD-10-CM | POA: Diagnosis not present

## 2015-08-16 DIAGNOSIS — C3410 Malignant neoplasm of upper lobe, unspecified bronchus or lung: Secondary | ICD-10-CM | POA: Diagnosis present

## 2015-08-16 DIAGNOSIS — S0083XA Contusion of other part of head, initial encounter: Secondary | ICD-10-CM | POA: Diagnosis present

## 2015-08-16 DIAGNOSIS — E039 Hypothyroidism, unspecified: Secondary | ICD-10-CM | POA: Diagnosis present

## 2015-08-16 DIAGNOSIS — R0602 Shortness of breath: Secondary | ICD-10-CM | POA: Diagnosis present

## 2015-08-16 DIAGNOSIS — J441 Chronic obstructive pulmonary disease with (acute) exacerbation: Secondary | ICD-10-CM | POA: Diagnosis present

## 2015-08-16 DIAGNOSIS — I5032 Chronic diastolic (congestive) heart failure: Secondary | ICD-10-CM | POA: Diagnosis present

## 2015-08-16 DIAGNOSIS — J3801 Paralysis of vocal cords and larynx, unilateral: Secondary | ICD-10-CM | POA: Diagnosis present

## 2015-08-16 DIAGNOSIS — S51811A Laceration without foreign body of right forearm, initial encounter: Secondary | ICD-10-CM | POA: Diagnosis present

## 2015-08-16 DIAGNOSIS — S20219A Contusion of unspecified front wall of thorax, initial encounter: Secondary | ICD-10-CM | POA: Diagnosis present

## 2015-08-16 DIAGNOSIS — E1141 Type 2 diabetes mellitus with diabetic mononeuropathy: Secondary | ICD-10-CM

## 2015-08-16 DIAGNOSIS — Z86711 Personal history of pulmonary embolism: Secondary | ICD-10-CM

## 2015-08-16 DIAGNOSIS — R000 Tachycardia, unspecified: Secondary | ICD-10-CM | POA: Diagnosis present

## 2015-08-16 DIAGNOSIS — E119 Type 2 diabetes mellitus without complications: Secondary | ICD-10-CM | POA: Diagnosis present

## 2015-08-16 DIAGNOSIS — E785 Hyperlipidemia, unspecified: Secondary | ICD-10-CM | POA: Diagnosis present

## 2015-08-16 DIAGNOSIS — Z7902 Long term (current) use of antithrombotics/antiplatelets: Secondary | ICD-10-CM | POA: Diagnosis not present

## 2015-08-16 DIAGNOSIS — I82409 Acute embolism and thrombosis of unspecified deep veins of unspecified lower extremity: Secondary | ICD-10-CM | POA: Diagnosis present

## 2015-08-16 DIAGNOSIS — J329 Chronic sinusitis, unspecified: Secondary | ICD-10-CM | POA: Diagnosis present

## 2015-08-16 DIAGNOSIS — C3411 Malignant neoplasm of upper lobe, right bronchus or lung: Secondary | ICD-10-CM

## 2015-08-16 DIAGNOSIS — Z7982 Long term (current) use of aspirin: Secondary | ICD-10-CM

## 2015-08-16 DIAGNOSIS — I82629 Acute embolism and thrombosis of deep veins of unspecified upper extremity: Secondary | ICD-10-CM | POA: Diagnosis not present

## 2015-08-16 DIAGNOSIS — S0990XA Unspecified injury of head, initial encounter: Secondary | ICD-10-CM

## 2015-08-16 DIAGNOSIS — R079 Chest pain, unspecified: Secondary | ICD-10-CM | POA: Diagnosis not present

## 2015-08-16 LAB — I-STAT CHEM 8, ED
BUN: 10 mg/dL (ref 6–20)
CALCIUM ION: 0.97 mmol/L — AB (ref 1.13–1.30)
CHLORIDE: 96 mmol/L — AB (ref 101–111)
Creatinine, Ser: 0.5 mg/dL (ref 0.44–1.00)
GLUCOSE: 307 mg/dL — AB (ref 65–99)
HCT: 38 % (ref 36.0–46.0)
Hemoglobin: 12.9 g/dL (ref 12.0–15.0)
Potassium: 3.7 mmol/L (ref 3.5–5.1)
Sodium: 134 mmol/L — ABNORMAL LOW (ref 135–145)
TCO2: 23 mmol/L (ref 0–100)

## 2015-08-16 LAB — ETHANOL

## 2015-08-16 LAB — COMPREHENSIVE METABOLIC PANEL
ALBUMIN: 3 g/dL — AB (ref 3.5–5.0)
ALT: 28 U/L (ref 14–54)
ANION GAP: 13 (ref 5–15)
AST: 30 U/L (ref 15–41)
Alkaline Phosphatase: 83 U/L (ref 38–126)
BUN: 9 mg/dL (ref 6–20)
CHLORIDE: 94 mmol/L — AB (ref 101–111)
CO2: 23 mmol/L (ref 22–32)
Calcium: 8 mg/dL — ABNORMAL LOW (ref 8.9–10.3)
Creatinine, Ser: 0.54 mg/dL (ref 0.44–1.00)
GFR calc Af Amer: >60 mL/min (ref >60)
GFR calc non Af Amer: >60 mL/min (ref >60)
GLUCOSE: 301 mg/dL — AB (ref 65–99)
POTASSIUM: 3.6 mmol/L (ref 3.5–5.1)
SODIUM: 130 mmol/L — AB (ref 135–145)
Total Bilirubin: 0.5 mg/dL (ref 0.3–1.2)
Total Protein: 6.1 g/dL — ABNORMAL LOW (ref 6.5–8.1)

## 2015-08-16 LAB — TSH: TSH: 1.5 u[IU]/mL (ref 0.350–4.500)

## 2015-08-16 LAB — RAPID URINE DRUG SCREEN, HOSP PERFORMED
AMPHETAMINES: NOT DETECTED
Barbiturates: NOT DETECTED
Benzodiazepines: POSITIVE — AB
COCAINE: NOT DETECTED
OPIATES: NOT DETECTED
TETRAHYDROCANNABINOL: NOT DETECTED

## 2015-08-16 LAB — CBC WITH DIFFERENTIAL/PLATELET
BASOS PCT: 0 %
Basophils Absolute: 0 10*3/uL (ref 0.0–0.1)
EOS ABS: 0.1 10*3/uL (ref 0.0–0.7)
EOS PCT: 1 %
HCT: 35.5 % — ABNORMAL LOW (ref 36.0–46.0)
Hemoglobin: 11 g/dL — ABNORMAL LOW (ref 12.0–15.0)
LYMPHS ABS: 1.3 10*3/uL (ref 0.7–4.0)
Lymphocytes Relative: 11 %
MCH: 25.1 pg — AB (ref 26.0–34.0)
MCHC: 31 g/dL (ref 30.0–36.0)
MCV: 80.9 fL (ref 78.0–100.0)
MONOS PCT: 7 %
Monocytes Absolute: 0.8 10*3/uL (ref 0.1–1.0)
Neutro Abs: 9.3 10*3/uL — ABNORMAL HIGH (ref 1.7–7.7)
Neutrophils Relative %: 81 %
PLATELETS: 122 10*3/uL — AB (ref 150–400)
RBC: 4.39 MIL/uL (ref 3.87–5.11)
RDW: 16.9 % — ABNORMAL HIGH (ref 11.5–15.5)
WBC: 11.5 10*3/uL — AB (ref 4.0–10.5)

## 2015-08-16 LAB — URINALYSIS, ROUTINE W REFLEX MICROSCOPIC
Bilirubin Urine: NEGATIVE
Glucose, UA: NEGATIVE mg/dL
Ketones, ur: NEGATIVE mg/dL
LEUKOCYTES UA: NEGATIVE
NITRITE: NEGATIVE
PROTEIN: NEGATIVE mg/dL
Specific Gravity, Urine: >1.046 — ABNORMAL HIGH (ref 1.005–1.030)
UROBILINOGEN UA: 0.2 mg/dL (ref 0.0–1.0)
pH: 6 (ref 5.0–8.0)

## 2015-08-16 LAB — URINE MICROSCOPIC-ADD ON

## 2015-08-16 LAB — CK: CK TOTAL: 55 U/L (ref 38–234)

## 2015-08-16 LAB — TROPONIN I: Troponin I: <0.03 ng/mL (ref <0.031)

## 2015-08-16 MED ORDER — ACETAMINOPHEN 650 MG RE SUPP: 650.0000 mg | Freq: Four times a day (QID) | RECTAL | Status: DC | PRN

## 2015-08-16 MED ORDER — RIVAROXABAN 20 MG PO TABS
20.0000 mg | ORAL_TABLET | Freq: Every day | ORAL | Status: DC
  Administered 2015-08-16 – 2015-08-20 (×4): 20 mg via ORAL
  Filled 2015-08-16 (×4): qty 1

## 2015-08-16 MED ORDER — SODIUM CHLORIDE 0.9 % IJ SOLN
3.0000 mL | Freq: Two times a day (BID) | INTRAMUSCULAR | Status: DC
  Administered 2015-08-17 – 2015-08-20 (×5): 3 mL via INTRAVENOUS

## 2015-08-16 MED ORDER — IPRATROPIUM-ALBUTEROL 0.5-2.5 (3) MG/3ML IN SOLN
3.0000 mL | Freq: Once | RESPIRATORY_TRACT | Status: AC
  Administered 2015-08-16: 3 mL via RESPIRATORY_TRACT
  Filled 2015-08-16: qty 3

## 2015-08-16 MED ORDER — METHYLPREDNISOLONE SODIUM SUCC 125 MG IJ SOLR
125.0000 mg | Freq: Once | INTRAMUSCULAR | Status: AC
  Administered 2015-08-16: 125 mg via INTRAVENOUS
  Filled 2015-08-16: qty 2

## 2015-08-16 MED ORDER — FENTANYL CITRATE (PF) 100 MCG/2ML IJ SOLN
25.0000 ug | Freq: Once | INTRAMUSCULAR | Status: AC
  Administered 2015-08-16: 25 ug via INTRAVENOUS
  Filled 2015-08-16: qty 2

## 2015-08-16 MED ORDER — SODIUM CHLORIDE 0.9 % IV SOLN
INTRAVENOUS | Status: DC
  Administered 2015-08-16: 22:00:00 via INTRAVENOUS
  Administered 2015-08-17: 1000 mL via INTRAVENOUS

## 2015-08-16 MED ORDER — LEVALBUTEROL HCL 1.25 MG/0.5ML IN NEBU
1.2500 mg | INHALATION_SOLUTION | RESPIRATORY_TRACT | Status: DC
  Administered 2015-08-17 (×2): 1.25 mg via RESPIRATORY_TRACT
  Filled 2015-08-16 (×2): qty 0.5

## 2015-08-16 MED ORDER — LORAZEPAM 1 MG PO TABS
1.0000 mg | ORAL_TABLET | Freq: Three times a day (TID) | ORAL | Status: DC | PRN
  Administered 2015-08-18 – 2015-08-21 (×3): 1 mg via ORAL
  Filled 2015-08-16 (×3): qty 1

## 2015-08-16 MED ORDER — LEVOTHYROXINE SODIUM 25 MCG PO TABS
137.0000 ug | ORAL_TABLET | Freq: Every day | ORAL | Status: DC
  Administered 2015-08-17 – 2015-08-21 (×5): 137 ug via ORAL
  Filled 2015-08-16 (×10): qty 1

## 2015-08-16 MED ORDER — CYANOCOBALAMIN 500 MCG PO TABS
1500.0000 ug | ORAL_TABLET | Freq: Every day | ORAL | Status: DC
  Administered 2015-08-17 – 2015-08-20 (×4): 1500 ug via ORAL
  Filled 2015-08-16 (×5): qty 3

## 2015-08-16 MED ORDER — HYDROMORPHONE HCL 1 MG/ML IJ SOLN
1.0000 mg | INTRAMUSCULAR | Status: DC | PRN
  Administered 2015-08-16 – 2015-08-17 (×4): 1 mg via INTRAVENOUS
  Filled 2015-08-16 (×4): qty 1

## 2015-08-16 MED ORDER — SERTRALINE HCL 50 MG PO TABS
150.0000 mg | ORAL_TABLET | Freq: Every day | ORAL | Status: DC
  Administered 2015-08-16 – 2015-08-20 (×5): 150 mg via ORAL
  Filled 2015-08-16 (×11): qty 1

## 2015-08-16 MED ORDER — ALUM & MAG HYDROXIDE-SIMETH 200-200-20 MG/5ML PO SUSP: 30.0000 mL | Freq: Four times a day (QID) | ORAL | Status: DC | PRN

## 2015-08-16 MED ORDER — SODIUM CHLORIDE 0.9 % IV BOLUS (SEPSIS)
500.0000 mL | Freq: Once | INTRAVENOUS | Status: AC
  Administered 2015-08-16: 500 mL via INTRAVENOUS

## 2015-08-16 MED ORDER — LEVALBUTEROL HCL 0.63 MG/3ML IN NEBU: 0.6300 mg | INHALATION_SOLUTION | RESPIRATORY_TRACT | Status: DC | PRN

## 2015-08-16 MED ORDER — LIDOCAINE-EPINEPHRINE (PF) 2 %-1:200000 IJ SOLN
20.0000 mL | Freq: Once | INTRAMUSCULAR | Status: AC
  Administered 2015-08-16: 20 mL
  Filled 2015-08-16: qty 20

## 2015-08-16 MED ORDER — INSULIN ASPART 100 UNIT/ML ~~LOC~~ SOLN
0.0000 [IU] | Freq: Three times a day (TID) | SUBCUTANEOUS | Status: DC
Start: 2015-08-17 — End: 2015-08-17
  Administered 2015-08-17: 9 [IU] via SUBCUTANEOUS
  Administered 2015-08-17: 7 [IU] via SUBCUTANEOUS
  Administered 2015-08-17: 9 [IU] via SUBCUTANEOUS

## 2015-08-16 MED ORDER — MORPHINE SULFATE (PF) 4 MG/ML IV SOLN
4.0000 mg | Freq: Once | INTRAVENOUS | Status: AC
  Administered 2015-08-16: 4 mg via INTRAVENOUS
  Filled 2015-08-16: qty 1

## 2015-08-16 MED ORDER — IPRATROPIUM BROMIDE 0.02 % IN SOLN
0.5000 mg | RESPIRATORY_TRACT | Status: DC
  Administered 2015-08-17: 0.5 mg via RESPIRATORY_TRACT
  Filled 2015-08-16: qty 2.5

## 2015-08-16 MED ORDER — ALBUTEROL SULFATE (2.5 MG/3ML) 0.083% IN NEBU
2.5000 mg | INHALATION_SOLUTION | Freq: Once | RESPIRATORY_TRACT | Status: AC
  Administered 2015-08-16: 2.5 mg via RESPIRATORY_TRACT
  Filled 2015-08-16: qty 3

## 2015-08-16 MED ORDER — BUDESONIDE-FORMOTEROL FUMARATE 160-4.5 MCG/ACT IN AERO
2.0000 | INHALATION_SPRAY | Freq: Two times a day (BID) | RESPIRATORY_TRACT | Status: DC
  Administered 2015-08-16 – 2015-08-17 (×2): 2 via RESPIRATORY_TRACT
  Filled 2015-08-16: qty 6

## 2015-08-16 MED ORDER — ONDANSETRON HCL 4 MG/2ML IJ SOLN
4.0000 mg | Freq: Four times a day (QID) | INTRAMUSCULAR | Status: DC | PRN
  Administered 2015-08-16 – 2015-08-21 (×11): 4 mg via INTRAVENOUS
  Filled 2015-08-16 (×11): qty 2

## 2015-08-16 MED ORDER — INSULIN ASPART 100 UNIT/ML ~~LOC~~ SOLN
0.0000 [IU] | Freq: Every day | SUBCUTANEOUS | Status: DC
  Administered 2015-08-16: 5 [IU] via SUBCUTANEOUS

## 2015-08-16 MED ORDER — DEXTROSE 5 % IV SOLN
500.0000 mg | INTRAVENOUS | Status: DC
  Administered 2015-08-16 – 2015-08-18 (×3): 500 mg via INTRAVENOUS
  Filled 2015-08-16 (×5): qty 500

## 2015-08-16 MED ORDER — ACETAMINOPHEN 325 MG PO TABS: 650.0000 mg | ORAL_TABLET | Freq: Four times a day (QID) | ORAL | Status: DC | PRN

## 2015-08-16 MED ORDER — MAGNESIUM OXIDE 400 (241.3 MG) MG PO TABS
400.0000 mg | ORAL_TABLET | Freq: Every day | ORAL | Status: DC
  Administered 2015-08-17 – 2015-08-21 (×5): 400 mg via ORAL
  Filled 2015-08-16 (×7): qty 1

## 2015-08-16 MED ORDER — IOHEXOL 300 MG/ML  SOLN
100.0000 mL | Freq: Once | INTRAMUSCULAR | Status: AC | PRN
Start: 2015-08-16 — End: 2015-08-16
  Administered 2015-08-16: 100 mL via INTRAVENOUS

## 2015-08-16 MED ORDER — METHYLPREDNISOLONE SODIUM SUCC 125 MG IJ SOLR
60.0000 mg | Freq: Four times a day (QID) | INTRAMUSCULAR | Status: DC
  Administered 2015-08-16 – 2015-08-17 (×4): 60 mg via INTRAVENOUS
  Filled 2015-08-16 (×4): qty 2

## 2015-08-16 MED ORDER — TETANUS-DIPHTH-ACELL PERTUSSIS 5-2.5-18.5 LF-MCG/0.5 IM SUSP
0.5000 mL | Freq: Once | INTRAMUSCULAR | Status: AC
  Administered 2015-08-16: 0.5 mL via INTRAMUSCULAR
  Filled 2015-08-16: qty 0.5

## 2015-08-16 MED ORDER — ONDANSETRON HCL 4 MG PO TABS
4.0000 mg | ORAL_TABLET | Freq: Four times a day (QID) | ORAL | Status: DC | PRN
  Filled 2015-08-16: qty 1

## 2015-08-16 MED ORDER — ADULT MULTIVITAMIN W/MINERALS CH
1.0000 | ORAL_TABLET | Freq: Every day | ORAL | Status: DC
  Administered 2015-08-17 – 2015-08-21 (×5): 1 via ORAL
  Filled 2015-08-16 (×5): qty 1
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
## 2015-08-16 NOTE — Progress Notes (Signed)
Pt arrived to 2C03 from the ED at 2010.  VS's are stable, with BP semi high in the 140's.  HR tachy 130's.  Pt is on 2L [place], AOx4, and complains of her whole body being sore but the pain is the worst in the center of her chest.  RN will continue to monitor.

## 2015-08-16 NOTE — Discharge Instructions (Signed)
Metformin and IV Contrast Studies For some X-ray exams, a contrast dye is used. Contrast dye is a type of medicine used to make the X-ray image clearer. The contrast dye is given to the patient through a vein (intravenously). If you need to have this type of X-ray exam and you take a medication called metformin, your caregiver may have you stop taking metformin before the exam.  LACTIC ACIDOSIS In rare cases, a serious medical condition called lactic acidosis can develop in people who take metformin and receive contrast dye. The following conditions can increase the risk of this complication:   Kidney failure.  Liver problems.  Certain types of heart problems such as:  Heart failure.  Heart attack.  Heart infection.  Heart valve problems.  Alcohol abuse. If left untreated, lactic acidosis can lead to coma.  SYMPTOMS OF LACTIC ACIDOSIS Symptoms of lactic acidosis can include:  Rapid breathing (hyperventilation).  Neurologic symptoms such as:  Headaches.  Confusion.  Dizziness.  Excessive sweating.  Feeling sick to your stomach (nauseous) or throwing up (vomiting). AFTER THE X-RAY EXAM  Stay well-hydrated. Drink fluids as instructed by your caregiver.  If you have a risk of developing lactic acidosis, blood tests may be done to make sure your kidney function is okay.  Metformin is usually stopped for 48 hours after the X-ray exam. Ask your caregiver when you can start taking metformin again. SEEK MEDICAL CARE IF:   You have shortness of breath or difficulty breathing.  You develop a headache that does not go away.  You have nausea or vomiting.  You urinate more than normal.  You develop a skin rash and have:  Redness.  Swelling.  Itching.   This information is not intended to replace advice given to you by your health care provider. Make sure you discuss any questions you have with your health care provider.   Document Released: 09/22/2009 Document  Revised: 02/18/2015 Document Reviewed: 09/22/2009 Elsevier Interactive Patient Education [employer].

## 2015-08-16 NOTE — ED Notes (Signed)
Placed pt on bedpan, will in and out cath after MD finished suturing pt.
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
## 2015-08-16 NOTE — ED Notes (Signed)
Pt was able to urinate and refused foley

## 2015-08-16 NOTE — ED Notes (Signed)
Off unit with cT

## 2015-08-17 ENCOUNTER — Inpatient Hospital Stay (HOSPITAL_COMMUNITY): Payer: Medicare Other

## 2015-08-17 DIAGNOSIS — R079 Chest pain, unspecified: Secondary | ICD-10-CM

## 2015-08-17 LAB — BASIC METABOLIC PANEL
ANION GAP: 12 (ref 5–15)
BUN: 11 mg/dL (ref 6–20)
CO2: 23 mmol/L (ref 22–32)
Calcium: 7.7 mg/dL — ABNORMAL LOW (ref 8.9–10.3)
Chloride: 96 mmol/L — ABNORMAL LOW (ref 101–111)
Creatinine, Ser: 0.69 mg/dL (ref 0.44–1.00)
GFR calc Af Amer: >60 mL/min (ref >60)
Glucose, Bld: 431 mg/dL — ABNORMAL HIGH (ref 65–99)
POTASSIUM: 4.2 mmol/L (ref 3.5–5.1)
SODIUM: 131 mmol/L — AB (ref 135–145)

## 2015-08-17 LAB — CBC
HEMATOCRIT: 32.3 % — AB (ref 36.0–46.0)
HEMATOCRIT: 29.4 % — AB (ref 36.0–46.0)
HEMOGLOBIN: 10.2 g/dL — AB (ref 12.0–15.0)
Hemoglobin: 9 g/dL — ABNORMAL LOW (ref 12.0–15.0)
MCH: 25.4 pg — ABNORMAL LOW (ref 26.0–34.0)
MCH: 25 pg — AB (ref 26.0–34.0)
MCHC: 31.6 g/dL (ref 30.0–36.0)
MCHC: 30.6 g/dL (ref 30.0–36.0)
MCV: 80.5 fL (ref 78.0–100.0)
MCV: 81.7 fL (ref 78.0–100.0)
PLATELETS: 117 10*3/uL — AB (ref 150–400)
Platelets: 114 10*3/uL — ABNORMAL LOW (ref 150–400)
RBC: 4.01 MIL/uL (ref 3.87–5.11)
RBC: 3.6 MIL/uL — ABNORMAL LOW (ref 3.87–5.11)
RDW: 17.1 % — AB (ref 11.5–15.5)
RDW: 16.9 % — AB (ref 11.5–15.5)
WBC: 9.4 10*3/uL (ref 4.0–10.5)
WBC: 13.6 10*3/uL — ABNORMAL HIGH (ref 4.0–10.5)

## 2015-08-17 LAB — GLUCOSE, CAPILLARY
GLUCOSE-CAPILLARY: 358 mg/dL — AB (ref 65–99)
GLUCOSE-CAPILLARY: 336 mg/dL — AB (ref 65–99)
Glucose-Capillary: 390 mg/dL — ABNORMAL HIGH (ref 65–99)
Glucose-Capillary: 362 mg/dL — ABNORMAL HIGH (ref 65–99)
Glucose-Capillary: 366 mg/dL — ABNORMAL HIGH (ref 65–99)

## 2015-08-17 MED ORDER — INSULIN ASPART 100 UNIT/ML ~~LOC~~ SOLN
0.0000 [IU] | Freq: Three times a day (TID) | SUBCUTANEOUS | Status: DC
  Administered 2015-08-18 (×2): 11 [IU] via SUBCUTANEOUS
  Administered 2015-08-18: 7 [IU] via SUBCUTANEOUS
  Administered 2015-08-19: 11 [IU] via SUBCUTANEOUS
  Administered 2015-08-19: 7 [IU] via SUBCUTANEOUS
  Administered 2015-08-19 – 2015-08-20 (×3): 11 [IU] via SUBCUTANEOUS

## 2015-08-17 MED ORDER — GUAIFENESIN ER 600 MG PO TB12
600.0000 mg | ORAL_TABLET | Freq: Two times a day (BID) | ORAL | Status: DC | PRN
  Administered 2015-08-18: 600 mg via ORAL
  Filled 2015-08-17: qty 1

## 2015-08-17 MED ORDER — LEVALBUTEROL HCL 1.25 MG/0.5ML IN NEBU: 1.2500 mg | INHALATION_SOLUTION | Freq: Three times a day (TID) | RESPIRATORY_TRACT | Status: DC

## 2015-08-17 MED ORDER — ATORVASTATIN CALCIUM 20 MG PO TABS
20.0000 mg | ORAL_TABLET | Freq: Every day | ORAL | Status: DC
  Administered 2015-08-17 – 2015-08-20 (×4): 20 mg via ORAL
  Filled 2015-08-17 (×4): qty 1

## 2015-08-17 MED ORDER — LEVALBUTEROL HCL 0.63 MG/3ML IN NEBU
0.6300 mg | INHALATION_SOLUTION | Freq: Four times a day (QID) | RESPIRATORY_TRACT | Status: DC
  Administered 2015-08-17 – 2015-08-21 (×14): 0.63 mg via RESPIRATORY_TRACT
  Filled 2015-08-17 (×15): qty 3

## 2015-08-17 MED ORDER — IPRATROPIUM BROMIDE 0.02 % IN SOLN
0.5000 mg | Freq: Four times a day (QID) | RESPIRATORY_TRACT | Status: DC
  Administered 2015-08-17 – 2015-08-21 (×14): 0.5 mg via RESPIRATORY_TRACT
  Filled 2015-08-17 (×15): qty 2.5

## 2015-08-17 MED ORDER — IPRATROPIUM BROMIDE 0.02 % IN SOLN
0.5000 mg | RESPIRATORY_TRACT | Status: DC
  Administered 2015-08-17: 0.5 mg via RESPIRATORY_TRACT
  Filled 2015-08-17: qty 2.5

## 2015-08-17 MED ORDER — INSULIN ASPART 100 UNIT/ML ~~LOC~~ SOLN
0.0000 [IU] | Freq: Every day | SUBCUTANEOUS | Status: DC
  Administered 2015-08-17: 5 [IU] via SUBCUTANEOUS
  Administered 2015-08-18 – 2015-08-19 (×2): 4 [IU] via SUBCUTANEOUS
  Administered 2015-08-20: 3 [IU] via SUBCUTANEOUS

## 2015-08-17 MED ORDER — INSULIN GLARGINE 100 UNIT/ML ~~LOC~~ SOLN
12.0000 [IU] | Freq: Every day | SUBCUTANEOUS | Status: DC
  Administered 2015-08-17: 12 [IU] via SUBCUTANEOUS
  Filled 2015-08-17 (×2): qty 0.12

## 2015-08-17 MED ORDER — BENZONATATE 100 MG PO CAPS
200.0000 mg | ORAL_CAPSULE | Freq: Three times a day (TID) | ORAL | Status: DC | PRN
  Administered 2015-08-17: 200 mg via ORAL
  Filled 2015-08-17: qty 2

## 2015-08-17 MED ORDER — HYDROMORPHONE HCL 1 MG/ML IJ SOLN
1.0000 mg | INTRAMUSCULAR | Status: DC | PRN
  Administered 2015-08-17 – 2015-08-21 (×19): 1 mg via INTRAVENOUS
  Filled 2015-08-17 (×19): qty 1

## 2015-08-17 MED ORDER — IPRATROPIUM BROMIDE 0.02 % IN SOLN
0.5000 mg | Freq: Three times a day (TID) | RESPIRATORY_TRACT | Status: DC
  Administered 2015-08-17: 0.5 mg via RESPIRATORY_TRACT
  Filled 2015-08-17: qty 2.5

## 2015-08-17 MED ORDER — SODIUM CHLORIDE 0.9 % IV SOLN
INTRAVENOUS | Status: DC
  Administered 2015-08-17 – 2015-08-18 (×2): via INTRAVENOUS

## 2015-08-17 MED ORDER — METHYLPREDNISOLONE SODIUM SUCC 125 MG IJ SOLR
60.0000 mg | Freq: Three times a day (TID) | INTRAMUSCULAR | Status: DC
  Administered 2015-08-17 – 2015-08-18 (×3): 60 mg via INTRAVENOUS
  Filled 2015-08-17 (×3): qty 2

## 2015-08-17 MED ORDER — LEVALBUTEROL HCL 0.63 MG/3ML IN NEBU: 0.6300 mg | INHALATION_SOLUTION | Freq: Three times a day (TID) | RESPIRATORY_TRACT | Status: DC

## 2015-08-17 MED ORDER — BUDESONIDE 0.25 MG/2ML IN SUSP
0.2500 mg | Freq: Two times a day (BID) | RESPIRATORY_TRACT | Status: DC
  Administered 2015-08-17 – 2015-08-21 (×8): 0.25 mg via RESPIRATORY_TRACT
  Filled 2015-08-17 (×8): qty 2

## 2015-08-17 MED ORDER — LEVALBUTEROL HCL 0.63 MG/3ML IN NEBU
0.6300 mg | INHALATION_SOLUTION | Freq: Three times a day (TID) | RESPIRATORY_TRACT | Status: DC
  Administered 2015-08-17: 0.63 mg via RESPIRATORY_TRACT
  Filled 2015-08-17: qty 3
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
## 2015-08-17 NOTE — Progress Notes (Signed)
*  PRELIMINARY RESULTS* Echocardiogram 2D Echocardiogram has been performed.  Leavy Cella 08/17/2015, 8:52 AM
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
## 2015-08-18 LAB — CBC
HEMATOCRIT: 29.1 % — AB (ref 36.0–46.0)
HEMOGLOBIN: 8.8 g/dL — AB (ref 12.0–15.0)
MCH: 24.9 pg — AB (ref 26.0–34.0)
MCHC: 30.2 g/dL (ref 30.0–36.0)
MCV: 82.4 fL (ref 78.0–100.0)
Platelets: 131 10*3/uL — ABNORMAL LOW (ref 150–400)
RBC: 3.53 MIL/uL — AB (ref 3.87–5.11)
RDW: 17.1 % — ABNORMAL HIGH (ref 11.5–15.5)
WBC: 14.8 10*3/uL — ABNORMAL HIGH (ref 4.0–10.5)

## 2015-08-18 LAB — COMPREHENSIVE METABOLIC PANEL
ALBUMIN: 2.5 g/dL — AB (ref 3.5–5.0)
ALT: 31 U/L (ref 14–54)
AST: 31 U/L (ref 15–41)
Alkaline Phosphatase: 69 U/L (ref 38–126)
Anion gap: 9 (ref 5–15)
BUN: 11 mg/dL (ref 6–20)
CHLORIDE: 102 mmol/L (ref 101–111)
CO2: 25 mmol/L (ref 22–32)
CREATININE: 0.6 mg/dL (ref 0.44–1.00)
Calcium: 7.7 mg/dL — ABNORMAL LOW (ref 8.9–10.3)
GFR calc Af Amer: >60 mL/min (ref >60)
Glucose, Bld: 322 mg/dL — ABNORMAL HIGH (ref 65–99)
POTASSIUM: 4.5 mmol/L (ref 3.5–5.1)
SODIUM: 136 mmol/L (ref 135–145)
Total Bilirubin: 0.3 mg/dL (ref 0.3–1.2)
Total Protein: 5.4 g/dL — ABNORMAL LOW (ref 6.5–8.1)

## 2015-08-18 LAB — PROTIME-INR
INR: 1.47 (ref 0.00–1.49)
PROTHROMBIN TIME: 17.9 s — AB (ref 11.6–15.2)

## 2015-08-18 LAB — URINE CULTURE

## 2015-08-18 LAB — GLUCOSE, CAPILLARY
GLUCOSE-CAPILLARY: 292 mg/dL — AB (ref 65–99)
GLUCOSE-CAPILLARY: 261 mg/dL — AB (ref 65–99)
GLUCOSE-CAPILLARY: 238 mg/dL — AB (ref 65–99)
GLUCOSE-CAPILLARY: 332 mg/dL — AB (ref 65–99)

## 2015-08-18 LAB — HEMOGLOBIN A1C
Hgb A1c MFr Bld: 8.2 % — ABNORMAL HIGH (ref 4.8–5.6)
Mean Plasma Glucose: 189 mg/dL

## 2015-08-18 MED ORDER — METHYLPREDNISOLONE SODIUM SUCC 125 MG IJ SOLR
60.0000 mg | Freq: Two times a day (BID) | INTRAMUSCULAR | Status: DC
  Administered 2015-08-19 – 2015-08-20 (×4): 60 mg via INTRAVENOUS
  Filled 2015-08-18 (×4): qty 2

## 2015-08-18 MED ORDER — MAGNESIUM OXIDE 400 (241.3 MG) MG PO TABS: 400.0000 mg | ORAL_TABLET | Freq: Every day | ORAL | Status: DC

## 2015-08-18 MED ORDER — INSULIN GLARGINE 100 UNIT/ML ~~LOC~~ SOLN
18.0000 [IU] | Freq: Every day | SUBCUTANEOUS | Status: DC
  Administered 2015-08-18 – 2015-08-19 (×2): 18 [IU] via SUBCUTANEOUS
  Filled 2015-08-18 (×3): qty 0.18
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
## 2015-08-18 NOTE — Progress Notes (Signed)
SATURATION QUALIFICATIONS: (This note is used to comply with regulatory documentation for home oxygen)  Patient Saturations on Room Air at Rest = NA  Patient Saturations on Room Air while Ambulating = 88%  Patient Saturations on 2 Liters of oxygen while Ambulating = 94%  Please briefly explain why patient needs home oxygen:By the numbers, supplemental oxygen brought her sats up to an acceptable level, though given a day or 2, pt may not need supplemental oxygen. 08/18/2015  Donnella Sham, [place] [phone]  (pager)

## 2015-08-18 NOTE — Progress Notes (Signed)
Inpatient Diabetes Program Recommendations  AACE/ADA: New Consensus Statement on Inpatient Glycemic Control (2015)  Target Ranges:  Prepandial:   less than 140 mg/dL      Peak postprandial:   less than 180 mg/dL (1-2 hours)      Critically ill patients:  140 - 180 mg/dL   Review of Glycemic Control  Inpatient Diabetes Program Recommendations:  Insulin - Basal: Increase Lantus to 20 units   Thank you  Raoul Pitch BSN, RN,CDE Inpatient Diabetes Coordinator [phone] (team pager)
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
## 2015-08-18 NOTE — Progress Notes (Signed)
Utilization review completed. Tullio Chausse, RN, BSN. 

## 2015-08-19 ENCOUNTER — Inpatient Hospital Stay (HOSPITAL_COMMUNITY): Payer: Medicare Other

## 2015-08-19 ENCOUNTER — Inpatient Hospital Stay: Payer: Medicare Other | Admitting: Adult Health

## 2015-08-19 ENCOUNTER — Encounter: Payer: Self-pay | Admitting: Nurse Practitioner

## 2015-08-19 DIAGNOSIS — R0789 Other chest pain: Secondary | ICD-10-CM | POA: Insufficient documentation

## 2015-08-19 LAB — CBC
HCT: 28.1 % — ABNORMAL LOW (ref 36.0–46.0)
HEMOGLOBIN: 8.5 g/dL — AB (ref 12.0–15.0)
MCH: 25.1 pg — AB (ref 26.0–34.0)
MCHC: 30.2 g/dL (ref 30.0–36.0)
MCV: 83.1 fL (ref 78.0–100.0)
Platelets: 137 10*3/uL — ABNORMAL LOW (ref 150–400)
RBC: 3.38 MIL/uL — AB (ref 3.87–5.11)
RDW: 17.2 % — ABNORMAL HIGH (ref 11.5–15.5)
WBC: 12 10*3/uL — ABNORMAL HIGH (ref 4.0–10.5)

## 2015-08-19 LAB — GLUCOSE, CAPILLARY
Glucose-Capillary: 284 mg/dL — ABNORMAL HIGH (ref 65–99)
Glucose-Capillary: 267 mg/dL — ABNORMAL HIGH (ref 65–99)
Glucose-Capillary: 249 mg/dL — ABNORMAL HIGH (ref 65–99)

## 2015-08-19 MED ORDER — METFORMIN HCL 500 MG PO TABS
1000.0000 mg | ORAL_TABLET | Freq: Two times a day (BID) | ORAL | Status: DC
  Administered 2015-08-19 – 2015-08-21 (×4): 1000 mg via ORAL
  Filled 2015-08-19 (×4): qty 2

## 2015-08-19 MED ORDER — GUAIFENESIN ER 600 MG PO TB12
600.0000 mg | ORAL_TABLET | Freq: Two times a day (BID) | ORAL | Status: DC
  Administered 2015-08-19 – 2015-08-21 (×5): 600 mg via ORAL
  Filled 2015-08-19 (×5): qty 1

## 2015-08-19 MED ORDER — AZITHROMYCIN 250 MG PO TABS
500.0000 mg | ORAL_TABLET | Freq: Every day | ORAL | Status: DC
  Administered 2015-08-19 – 2015-08-20 (×2): 500 mg via ORAL
  Filled 2015-08-19 (×2): qty 2

## 2015-08-19 MED ORDER — SPIRONOLACTONE 25 MG PO TABS
25.0000 mg | ORAL_TABLET | Freq: Every day | ORAL | Status: DC
  Administered 2015-08-19 – 2015-08-21 (×3): 25 mg via ORAL
  Filled 2015-08-19 (×3): qty 1

## 2015-08-19 MED ORDER — GLIMEPIRIDE 4 MG PO TABS
4.0000 mg | ORAL_TABLET | Freq: Every day | ORAL | Status: DC
  Administered 2015-08-20: 4 mg via ORAL
  Filled 2015-08-19 (×2): qty 1

## 2015-08-19 MED ORDER — FLUTICASONE PROPIONATE 50 MCG/ACT NA SUSP
2.0000 | Freq: Every day | NASAL | Status: DC
  Administered 2015-08-19 – 2015-08-21 (×3): 2 via NASAL
  Filled 2015-08-19: qty 16
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
## 2015-08-19 NOTE — Assessment & Plan Note (Addendum)
Patient presented to the [facility] today to receive her next cycle of chemotherapy.  She was just discharged from the hospital this past Sunday, 08/10/2015 with diagnosis of respiratory failure.  She states that she just completed her Levaquin antibiotics yesterday.  She reports some chronic chest wall pain for the last month or so.  She states that the pain is continual; and rates it at about a 3 out of 10 on the pain scale.  She denies any chest pressure, shortness of breath, or pain with inspiration.  She also denies any recent fevers or chills.  On exam today.  Lungs with mild rhonchi and some wheezes.  EKG obtained today reveal a sinus tachycardia with a rate of 122 and a QTC of 467.  Patient appears in no acute distress.  Vital signs remained stable; other than the tachycardia.  Most likely, patient's chronic chest wall pain is secondary to her disease.  She also has a history of chronic COPD.  Reviewed all findings with Dr. Burr Medico- and decision was made to hold the chemotherapy for today to allow patient to recover further from her recent hospital admission.  Patient was advised to call/return or directly to the emergency department for any worsening symptoms whatsoever.

## 2015-08-19 NOTE — Assessment & Plan Note (Signed)
Patient has a history of both PE and DVT in the past; and continues to take Xarelto as directed.
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
## 2015-08-19 NOTE — Care Management Note (Signed)
Case Management Note  Patient Details  Name: Janice Ball MRN: [id number] Date of Birth: 05-16-1956  Subjective/Objective:                    Action/Plan:  Spoke to patient regarding home health . Patient active with [facility], she lives with her daughter who works from home and can provide assistance and supervision . Patient is agreeable to Home Health RN and PT . Patient already has 3 in 1 and rolling walker .   Patient does not have home oxygen , explained nurse with check her oxygen saturation before during and after ambulation to see if home oxygen needed , closer to discharge. Patient voiced understanding. Expected Discharge Date:                  Expected Discharge Plan:  [facility]  In-House Referral:     Discharge planning Services  CM Consult  Post Acute Care Choice:  Home Health, Durable Medical Equipment Choice offered to:     DME Arranged:    DME Agency:     HH Arranged:  RN, PT HH Agency:  [facility]  Status of Service:  In process, will continue to follow  Medicare Important Message Given:    Date Medicare IM Given:    Medicare IM give by:    Date Additional Medicare IM Given:    Additional Medicare Important Message give by:     If discussed at [place] of Stay Meetings, dates discussed:    Additional Comments:  Marilu Favre, RN 08/19/2015, 10:26 AM

## 2015-08-19 NOTE — Assessment & Plan Note (Addendum)
Patient received her last cycle of gemcitabine chemotherapy on 08/07/2015.  She presented to the [facility] today to receive her next cycle of chemotherapy.  However, patient reports just being discharged from the hospital this past Sunday, 08/10/2015 with diagnosis of respiratory failure.  She states that she just completed Levaquin antibiotics yesterday.  She continues to complain of some chronic chest wall pain for approximately one month or so.  She denies any specific chest pressure, worsening shortness of breath, or pain with inspiration.  She denies any recent fevers or chills.  Patient states that she has an appointment for follow-up with her pulmonologist on Tuesday, 08/19/2015.  On exam.-Patient with some mild rhonchi and soft wheezes; but no acute respiratory distress.  Blood counts obtained today revealed a WBC of 9.1, ANC 7.1, hemoglobin 11.2, platelet count 132.  EKG revealed sinus tachycardia with a rate of 122 and a QTC of 467.  After reviewing all findings with the on-call physician-decision was made to hold chemotherapy today to allow patient to continue to recover.  Emphasized that patient should follow-up with her pulmonologist as planned this coming week.  Also, patient was advised to return to the [facility] go directly to the emergency department for any worsening symptoms whatsoever.  Patient is scheduled to return on 08/28/2015 for labs, visit, and chemotherapy.
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
## 2015-08-19 NOTE — Care Management Important Message (Signed)
Important Message  Patient Details  Name: Janice Ball MRN: [id number] Date of Birth: [birth year]   Medicare Important Message Given:  Yes-second notification given    Nathen May 08/19/2015, 10:27 AM

## 2015-08-20 DIAGNOSIS — E1141 Type 2 diabetes mellitus with diabetic mononeuropathy: Secondary | ICD-10-CM

## 2015-08-20 DIAGNOSIS — J441 Chronic obstructive pulmonary disease with (acute) exacerbation: Secondary | ICD-10-CM

## 2015-08-20 DIAGNOSIS — I5032 Chronic diastolic (congestive) heart failure: Secondary | ICD-10-CM

## 2015-08-20 DIAGNOSIS — J9601 Acute respiratory failure with hypoxia: Principal | ICD-10-CM

## 2015-08-20 LAB — GLUCOSE, CAPILLARY
GLUCOSE-CAPILLARY: 263 mg/dL — AB (ref 65–99)
GLUCOSE-CAPILLARY: 270 mg/dL — AB (ref 65–99)
Glucose-Capillary: 340 mg/dL — ABNORMAL HIGH (ref 65–99)
Glucose-Capillary: 257 mg/dL — ABNORMAL HIGH (ref 65–99)
Glucose-Capillary: 79 mg/dL (ref 65–99)

## 2015-08-20 MED ORDER — INSULIN ASPART 100 UNIT/ML ~~LOC~~ SOLN
5.0000 [IU] | Freq: Three times a day (TID) | SUBCUTANEOUS | Status: DC
  Administered 2015-08-20 (×2): 5 [IU] via SUBCUTANEOUS

## 2015-08-20 MED ORDER — PREDNISONE 50 MG PO TABS
50.0000 mg | ORAL_TABLET | Freq: Every day | ORAL | Status: DC
  Administered 2015-08-21: 50 mg via ORAL
  Filled 2015-08-20: qty 1

## 2015-08-20 MED ORDER — VITAMIN B-12 1000 MCG PO TABS
1500.0000 ug | ORAL_TABLET | Freq: Every day | ORAL | Status: DC
  Administered 2015-08-21: 1500 ug via ORAL
  Filled 2015-08-20: qty 2

## 2015-08-20 MED ORDER — OXYCODONE HCL 5 MG PO TABS
5.0000 mg | ORAL_TABLET | ORAL | Status: DC | PRN
  Administered 2015-08-20 – 2015-08-21 (×2): 5 mg via ORAL
  Filled 2015-08-20 (×2): qty 1

## 2015-08-20 MED ORDER — INSULIN GLARGINE 100 UNIT/ML ~~LOC~~ SOLN
25.0000 [IU] | Freq: Every day | SUBCUTANEOUS | Status: DC
  Administered 2015-08-20: 25 [IU] via SUBCUTANEOUS
  Filled 2015-08-20 (×2): qty 0.25
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
## 2015-08-21 ENCOUNTER — Other Ambulatory Visit: Payer: Medicare Other

## 2015-08-21 ENCOUNTER — Ambulatory Visit: Payer: Medicare Other | Admitting: Internal Medicine

## 2015-08-21 ENCOUNTER — Ambulatory Visit: Payer: Medicare Other

## 2015-08-21 DIAGNOSIS — I82629 Acute embolism and thrombosis of deep veins of unspecified upper extremity: Secondary | ICD-10-CM

## 2015-08-21 DIAGNOSIS — E119 Type 2 diabetes mellitus without complications: Secondary | ICD-10-CM

## 2015-08-21 LAB — BASIC METABOLIC PANEL
Anion gap: 13 (ref 5–15)
BUN: 15 mg/dL (ref 6–20)
CALCIUM: 8.6 mg/dL — AB (ref 8.9–10.3)
CO2: 30 mmol/L (ref 22–32)
CREATININE: 0.68 mg/dL (ref 0.44–1.00)
Chloride: 95 mmol/L — ABNORMAL LOW (ref 101–111)
GFR calc Af Amer: >60 mL/min (ref >60)
Glucose, Bld: 173 mg/dL — ABNORMAL HIGH (ref 65–99)
Potassium: 4.3 mmol/L (ref 3.5–5.1)
SODIUM: 138 mmol/L (ref 135–145)

## 2015-08-21 LAB — GLUCOSE, CAPILLARY
GLUCOSE-CAPILLARY: 109 mg/dL — AB (ref 65–99)
Glucose-Capillary: 159 mg/dL — ABNORMAL HIGH (ref 65–99)

## 2015-08-21 LAB — CBC
HCT: 30.2 % — ABNORMAL LOW (ref 36.0–46.0)
Hemoglobin: 9.6 g/dL — ABNORMAL LOW (ref 12.0–15.0)
MCH: 26.1 pg (ref 26.0–34.0)
MCHC: 31.8 g/dL (ref 30.0–36.0)
MCV: 82.1 fL (ref 78.0–100.0)
PLATELETS: 212 10*3/uL (ref 150–400)
RBC: 3.68 MIL/uL — ABNORMAL LOW (ref 3.87–5.11)
RDW: 17 % — AB (ref 11.5–15.5)
WBC: 10 10*3/uL (ref 4.0–10.5)

## 2015-08-21 MED ORDER — PREDNISONE 20 MG PO TABS: ORAL_TABLET | ORAL | Status: DC

## 2015-08-21 MED ORDER — BENZONATATE 200 MG PO CAPS: 200.0000 mg | ORAL_CAPSULE | Freq: Three times a day (TID) | ORAL | Status: DC | PRN
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
## 2015-08-21 NOTE — Progress Notes (Signed)
@   2542 heard somebody calling out for help, noted pt sitting on the floor by the foot of the bed. Pt stated " I slid down from the bed, was trying to go to bathroom. Maybe I was still half-asleep" Denies hitting head.Denies c/o pain. Pt assesed.No injuries noted. Instructed pt.to use call light for assistance. VS as follows: T-97.6,P-136,R-20,BP-148/62 O2 sats at 96% RA. CBG checked-- 159. Notified K. Schorr,NP at 0410 with no new order. Pt requested to notify daughter later in the morning.

## 2015-08-21 NOTE — Progress Notes (Signed)
Called daughter Geroge Baseman and made aware of pt fall.

## 2015-08-25 ENCOUNTER — Telehealth: Payer: Self-pay | Admitting: Internal Medicine

## 2015-08-25 NOTE — Telephone Encounter (Signed)
returned call and s.w. pt and confirmed appt....pt ok and aware °

## 2015-08-26 ENCOUNTER — Telehealth: Payer: Self-pay | Admitting: *Deleted
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
## 2015-08-28 ENCOUNTER — Other Ambulatory Visit: Payer: Medicare Other

## 2015-08-28 ENCOUNTER — Ambulatory Visit: Payer: Medicare Other

## 2015-08-28 ENCOUNTER — Ambulatory Visit (HOSPITAL_BASED_OUTPATIENT_CLINIC_OR_DEPARTMENT_OTHER): Payer: Medicare Other | Admitting: Nurse Practitioner

## 2015-08-28 ENCOUNTER — Other Ambulatory Visit (HOSPITAL_BASED_OUTPATIENT_CLINIC_OR_DEPARTMENT_OTHER): Payer: Medicare Other

## 2015-08-28 ENCOUNTER — Ambulatory Visit: Payer: Medicare Other | Admitting: Internal Medicine

## 2015-08-28 ENCOUNTER — Telehealth: Payer: Self-pay | Admitting: Internal Medicine

## 2015-08-28 VITALS — BP 146/69 | HR 128 | Temp 98.0°F | Resp 17 | Ht 67.0 in | Wt 153.0 lb

## 2015-08-28 DIAGNOSIS — C3411 Malignant neoplasm of upper lobe, right bronchus or lung: Secondary | ICD-10-CM

## 2015-08-28 DIAGNOSIS — C3490 Malignant neoplasm of unspecified part of unspecified bronchus or lung: Secondary | ICD-10-CM | POA: Diagnosis not present

## 2015-08-28 LAB — CBC WITH DIFFERENTIAL/PLATELET
BASO%: 0.1 % (ref 0.0–2.0)
BASOS ABS: 0 10*3/uL (ref 0.0–0.1)
EOS ABS: 0 10*3/uL (ref 0.0–0.5)
EOS%: 0.1 % (ref 0.0–7.0)
HCT: 36 % (ref 34.8–46.6)
HGB: 11 g/dL — ABNORMAL LOW (ref 11.6–15.9)
LYMPH%: 4.4 % — AB (ref 14.0–49.7)
MCH: 24.7 pg — AB (ref 25.1–34.0)
MCHC: 30.5 g/dL — AB (ref 31.5–36.0)
MCV: 81 fL (ref 79.5–101.0)
MONO#: 0.8 10*3/uL (ref 0.1–0.9)
MONO%: 5 % (ref 0.0–14.0)
NEUT#: 14.3 10*3/uL — ABNORMAL HIGH (ref 1.5–6.5)
NEUT%: 90.4 % — AB (ref 38.4–76.8)
PLATELETS: 235 10*3/uL (ref 145–400)
RBC: 4.44 10*6/uL (ref 3.70–5.45)
RDW: 18.4 % — ABNORMAL HIGH (ref 11.2–14.5)
WBC: 15.8 10*3/uL — ABNORMAL HIGH (ref 3.9–10.3)
lymph#: 0.7 10*3/uL — ABNORMAL LOW (ref 0.9–3.3)

## 2015-08-28 LAB — COMPREHENSIVE METABOLIC PANEL (CC13)
ALT: 19 U/L (ref 0–55)
ANION GAP: 15 meq/L — AB (ref 3–11)
AST: 11 U/L (ref 5–34)
Albumin: 2.9 g/dL — ABNORMAL LOW (ref 3.5–5.0)
Alkaline Phosphatase: 127 U/L (ref 40–150)
BILIRUBIN TOTAL: 0.53 mg/dL (ref 0.20–1.20)
BUN: 11.3 mg/dL (ref 7.0–26.0)
CHLORIDE: 100 meq/L (ref 98–109)
CO2: 20 meq/L — AB (ref 22–29)
Calcium: 8.2 mg/dL — ABNORMAL LOW (ref 8.4–10.4)
Creatinine: 0.8 mg/dL (ref 0.6–1.1)
EGFR: 77 mL/min/{1.73_m2} — AB (ref >90)
Glucose: 423 mg/dl — ABNORMAL HIGH (ref 70–140)
Potassium: 4 mEq/L (ref 3.5–5.1)
Sodium: 135 mEq/L — ABNORMAL LOW (ref 136–145)
Total Protein: 6.2 g/dL — ABNORMAL LOW (ref 6.4–8.3)

## 2015-08-28 NOTE — Telephone Encounter (Signed)
11/17 appointment cancelled per pof  anne
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
## 2015-09-03 ENCOUNTER — Encounter: Payer: Self-pay | Admitting: Pulmonary Disease

## 2015-09-03 ENCOUNTER — Ambulatory Visit (INDEPENDENT_AMBULATORY_CARE_PROVIDER_SITE_OTHER): Payer: Medicare Other | Admitting: Pulmonary Disease

## 2015-09-03 VITALS — BP 128/74 | HR 133 | Ht 67.0 in | Wt 152.2 lb

## 2015-09-03 DIAGNOSIS — J441 Chronic obstructive pulmonary disease with (acute) exacerbation: Secondary | ICD-10-CM

## 2015-09-03 MED ORDER — METHYLPREDNISOLONE ACETATE 80 MG/ML IJ SUSP
80.0000 mg | Freq: Once | INTRAMUSCULAR | Status: AC
  Administered 2015-09-03: 80 mg via INTRAMUSCULAR

## 2015-09-03 MED ORDER — PREDNISONE 10 MG PO TABS: ORAL_TABLET | ORAL | Status: DC

## 2015-09-03 MED ORDER — PREDNISONE 10 MG PO TABS: 10.0000 mg | ORAL_TABLET | Freq: Every day | ORAL | Status: DC

## 2015-09-03 MED ORDER — HYDROCOD POLST-CPM POLST ER 10-8 MG/5ML PO SUER: 5.0000 mL | Freq: Two times a day (BID) | ORAL | Status: AC | PRN

## 2015-09-03 MED ORDER — LEVOFLOXACIN 500 MG PO TABS: 500.0000 mg | ORAL_TABLET | Freq: Every day | ORAL | Status: DC

## 2015-09-03 MED ORDER — BENZONATATE 200 MG PO CAPS: 200.0000 mg | ORAL_CAPSULE | Freq: Three times a day (TID) | ORAL | Status: AC | PRN
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
## 2015-09-03 NOTE — Patient Instructions (Addendum)
We will give you a dose of solumedrol 80 mg today and start a prednisone taper starting at 60 mg. Reduce dose by 10 mg every 3 days. Levaquin 500 milligrams for 14 days We will also prescribe tessalon and tussionex for cough.  Return to clinic in 1-2 months.

## 2015-09-03 NOTE — Addendum Note (Signed)
Addended by: Maryanna Shape A on: 09/03/2015 05:00 PM   Modules accepted: Orders

## 2015-09-04 ENCOUNTER — Other Ambulatory Visit: Payer: Medicare Other

## 2015-09-04 ENCOUNTER — Ambulatory Visit: Payer: Medicare Other

## 2015-09-10 ENCOUNTER — Ambulatory Visit: Payer: Medicare Other

## 2015-09-10 ENCOUNTER — Other Ambulatory Visit: Payer: Medicare Other

## 2015-09-10 ENCOUNTER — Ambulatory Visit: Payer: Medicare Other | Admitting: Internal Medicine

## 2015-09-10 ENCOUNTER — Other Ambulatory Visit: Payer: Self-pay | Admitting: *Deleted

## 2015-09-10 DIAGNOSIS — C3411 Malignant neoplasm of upper lobe, right bronchus or lung: Secondary | ICD-10-CM

## 2015-09-10 MED ORDER — OXYCODONE HCL 5 MG PO TABS: 5.0000 mg | ORAL_TABLET | ORAL | Status: DC | PRN

## 2015-09-10 NOTE — Telephone Encounter (Signed)
Pt requesting refill on oxycodone. Reviewed with MD. Rx gien to pt

## 2015-09-12 ENCOUNTER — Telehealth: Payer: Self-pay | Admitting: Internal Medicine

## 2015-09-12 NOTE — Telephone Encounter (Signed)
Due to meeting moved 12/1 f/u to AM per MM. Left message for patient re change and new time for 12/1 @ 10:30 am. Schedule mailed.

## 2015-09-17 ENCOUNTER — Other Ambulatory Visit: Payer: Medicare Other

## 2015-09-17 ENCOUNTER — Ambulatory Visit: Payer: Medicare Other

## 2015-09-18 ENCOUNTER — Encounter: Payer: Self-pay | Admitting: Internal Medicine

## 2015-09-18 ENCOUNTER — Other Ambulatory Visit (HOSPITAL_BASED_OUTPATIENT_CLINIC_OR_DEPARTMENT_OTHER): Payer: Medicare Other

## 2015-09-18 ENCOUNTER — Telehealth: Payer: Self-pay | Admitting: *Deleted

## 2015-09-18 ENCOUNTER — Telehealth: Payer: Self-pay | Admitting: Internal Medicine

## 2015-09-18 ENCOUNTER — Ambulatory Visit (HOSPITAL_BASED_OUTPATIENT_CLINIC_OR_DEPARTMENT_OTHER): Payer: Medicare Other | Admitting: Internal Medicine

## 2015-09-18 ENCOUNTER — Ambulatory Visit: Payer: Medicare Other

## 2015-09-18 VITALS — BP 140/74 | HR 134 | Temp 98.0°F | Resp 17 | Ht 67.0 in | Wt 143.5 lb

## 2015-09-18 DIAGNOSIS — C3411 Malignant neoplasm of upper lobe, right bronchus or lung: Secondary | ICD-10-CM

## 2015-09-18 DIAGNOSIS — Z86718 Personal history of other venous thrombosis and embolism: Secondary | ICD-10-CM

## 2015-09-18 DIAGNOSIS — J449 Chronic obstructive pulmonary disease, unspecified: Secondary | ICD-10-CM

## 2015-09-18 LAB — COMPREHENSIVE METABOLIC PANEL (CC13)
ALT: 16 U/L (ref 0–55)
ANION GAP: 12 meq/L — AB (ref 3–11)
AST: 17 U/L (ref 5–34)
Albumin: 3 g/dL — ABNORMAL LOW (ref 3.5–5.0)
Alkaline Phosphatase: 120 U/L (ref 40–150)
BILIRUBIN TOTAL: 0.63 mg/dL (ref 0.20–1.20)
BUN: 13.6 mg/dL (ref 7.0–26.0)
CHLORIDE: 100 meq/L (ref 98–109)
CO2: 24 meq/L (ref 22–29)
CREATININE: 0.8 mg/dL (ref 0.6–1.1)
Calcium: 8.1 mg/dL — ABNORMAL LOW (ref 8.4–10.4)
EGFR: 81 mL/min/{1.73_m2} — ABNORMAL LOW (ref >90)
GLUCOSE: 296 mg/dL — AB (ref 70–140)
Potassium: 3.7 mEq/L (ref 3.5–5.1)
SODIUM: 136 meq/L (ref 136–145)
TOTAL PROTEIN: 6.6 g/dL (ref 6.4–8.3)

## 2015-09-18 LAB — CBC WITH DIFFERENTIAL/PLATELET
BASO%: 0.8 % (ref 0.0–2.0)
Basophils Absolute: 0.1 10*3/uL (ref 0.0–0.1)
EOS%: 0.5 % (ref 0.0–7.0)
Eosinophils Absolute: 0.1 10*3/uL (ref 0.0–0.5)
HCT: 38.1 % (ref 34.8–46.6)
HGB: 11.8 g/dL (ref 11.6–15.9)
LYMPH%: 8 % — AB (ref 14.0–49.7)
MCH: 24.2 pg — ABNORMAL LOW (ref 25.1–34.0)
MCHC: 31 g/dL — AB (ref 31.5–36.0)
MCV: 78.1 fL — ABNORMAL LOW (ref 79.5–101.0)
MONO#: 1.3 10*3/uL — AB (ref 0.1–0.9)
MONO%: 7.8 % (ref 0.0–14.0)
NEUT%: 82.9 % — AB (ref 38.4–76.8)
NEUTROS ABS: 13.7 10*3/uL — AB (ref 1.5–6.5)
Platelets: 258 10*3/uL (ref 145–400)
RBC: 4.88 10*6/uL (ref 3.70–5.45)
RDW: 18.1 % — ABNORMAL HIGH (ref 11.2–14.5)
WBC: 16.5 10*3/uL — AB (ref 3.9–10.3)
lymph#: 1.3 10*3/uL (ref 0.9–3.3)

## 2015-09-18 NOTE — Progress Notes (Signed)
[place] [phone]   [phone]  OFFICE PROGRESS NOTE  Janice Chimes, NP [place] [address]  DIAGNOSIS: Recurrent non-small cell lung cancer, squamous cell carcinoma initially diagnosis stage IIIA (T1 N2 MX ) in August of 2010.   PRIOR THERAPY:  #1 status post concurrent chemoradiation with weekly carboplatin and paclitaxel, last dose of chemotherapy given 08/05/2011.  #2 status post consolidation chemotherapy with carboplatin paclitaxel last dose given 10/27/2009.  #3 Concurrent chemoradiation with weekly carboplatin for an AUC of 2 and paclitaxel at 45 mg per meter squared given concurrent with radiotherapy, last dose was given 09/20/2011.  #4 Systemic chemotherapy with gemcitabine 1000 mg meter squared on days 1 and 8 every 3 weeks status post 3 cycles with stable disease, last dose was given 12/20/2011.  #5 Systemic chemotherapy again with single agent gemcitabine 1000 mg/M2 on days 1 and 8 every 3 weeks. First cycle 11/07/2013. Status post 4 cycles. #6  Systemic chemotherapy again with single agent gemcitabine 1000 mg/M2 on days 1 and 8 every 3 weeks. First dose 07/24/2014. Discontinued today secondary to intolerance. #7  Immunotherapy with Nivolumab 3 MG/KG every 2 weeks. First dose 09/18/2014. She is status post 2 cycles. Last dose was given 10/02/2014 discontinued secondary to disease progression with significant SVC syndrome.  CURRENT THERAPY: Systemic chemotherapy with gemcitabine 800 MG/M2 on days 1 and 8 every 3 weeks. First dose 11/07/2014. Status post 10 cycles.  INTERVAL HISTORY: Janice Ball 59 y.o. female returns to the clinic today for follow-up visit. The patient continues to have increasing fatigue and weakness as well as cough and upper respiratory wheezes. She was treated recently with Levaquin as well as prednisone taper doses. She also had recent cellulitis of the right upper extremity and  currently on treatment with doxycycline. She has been off treatment for the last few weeks after a full and significant bruises and ecchymosis of the face and head. She continues to complain of shortness of breath at baseline and increased with exertion. She denied having any significant fever or chills. She has no nausea or vomiting. She is here today for evaluation before resuming her systemic chemotherapy.  MEDICAL HISTORY: Past Medical History  Diagnosis Date  . Anxiety   . Hyperlipidemia   . Hypothyroidism   . COPD (chronic obstructive pulmonary disease) ([facility])   . Arthritis     thumbs  . 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 Lung cancer ([facility]) dx'd 05/2009  . Lung cancer ([place]) dx'd 09/2013    recurrence in LN  . Diabetes mellitus   . Pulmonary embolism ([facility])     'I've had a blood clot in my chest'  . DNR no code (do not resuscitate) 07/31/2015    ALLERGIES:  is allergic to carboplatin; sulfonamide derivatives; codeine; dilaudid; keflex; penicillins; and vicodin.  MEDICATIONS:  Current Outpatient Prescriptions  Medication Sig Dispense Refill  . acetaminophen (TYLENOL) 325 MG tablet Take 650 mg by mouth every 6 (six) hours as needed for mild pain or headache.     . alendronate (FOSAMAX) 10 MG tablet Take 10 mg by mouth daily.     Marland Kitchen atorvastatin (LIPITOR) 20 MG tablet Take 20 mg by mouth at bedtime.    . benzonatate (TESSALON) 200 MG capsule Take 1 capsule (200 mg total) by mouth 3 (three) times daily as needed for  cough. 30 capsule 1  . benzonatate (TESSALON) 200 MG capsule Take 1 capsule (200 mg total) by mouth 3 (three) times daily as needed for cough. 30 capsule 1  . Bioflavonoid Products (ESTER C PO) Take 1 tablet by mouth 2 (two) times daily. Ester-C Calcium Ascorbate (Vitamin C 500 mg and Calcium 55 mg)    . budesonide-formoterol (SYMBICORT) 160-4.5 MCG/ACT inhaler Inhale 2  puffs into the lungs 2 (two) times daily.    . chlorpheniramine-HYDROcodone (TUSSIONEX PENNKINETIC ER) 10-8 MG/5ML SUER Take 5 mLs by mouth 2 (two) times daily as needed for cough. 140 mL 0  . cholecalciferol (VITAMIN D) 1000 UNITS tablet Take 1,000 Units by mouth daily.     . [employer] NATURAL LUTEIN EYE HEALTH PO Take 1 tablet by mouth at bedtime.    . Cyanocobalamin 1500 MCG TBDP Take 1,500 mcg by mouth daily. Vitamin B12    . doxycycline (VIBRAMYCIN) 100 MG capsule     . glimepiride (AMARYL) 2 MG tablet Take 2 tablets (4 mg total) by mouth daily with breakfast. Take higher dose (67mby mouth) while on prednisone. Once off prednisone go back to home regimen of 2 mg by mouth daily. (Patient taking differently: Take 2 mg by mouth daily with breakfast. ) 30 tablet 0  . Guaifenesin (MUCINEX MAXIMUM STRENGTH) 1200 MG TB12 Take 1,200 mg by mouth 2 (two) times daily.    .Marland Kitchenlevofloxacin (LEVAQUIN) 500 MG tablet Take 1 tablet (500 mg total) by mouth daily. 14 tablet 0  . levothyroxine (SYNTHROID, LEVOTHROID) 137 MCG tablet Take 137 mcg by mouth daily.     .Marland KitchenLORazepam (ATIVAN) 1 MG tablet Take 1 mg by mouth 3 (three) times daily as needed for anxiety.     . Magnesium Oxide 500 MG TABS Take 500 mg by mouth daily.    . metFORMIN (GLUCOPHAGE) 1000 MG tablet Take 1,000 mg by mouth 2 (two) times daily with a meal.    . Methylsulfonylmethane (MSM) 1000 MG CAPS Take 1,000 mg by mouth daily.    . Multiple Vitamin (MULTIVITAMIN WITH MINERALS) TABS tablet Take 1 tablet by mouth daily. Spectravite    . omega-3 acid ethyl esters (LOVAZA) 1 G capsule Take by mouth 2 (two) times daily.    .Marland KitchenoxyCODONE (OXY IR/ROXICODONE) 5 MG immediate release tablet Take 1 tablet (5 mg total) by mouth every 4 (four) hours as needed for severe pain. 60 tablet 0  . predniSONE (DELTASONE) 10 MG tablet 6 tab x3 days 5tab x3 days 4tabs x3 days 3tab x3 days 2tabs x3 days 1 tab x3 days 63 tablet 0  . PROAIR HFA 108 (90 BASE) MCG/ACT inhaler INHALE 2  PUFFS INTO THE LUNGS EVERY 6 HOURS AS NEEDED FOR WHEEZING OR SHORTNESS OF BREATH. 1 Inhaler 2  . prochlorperazine (COMPAZINE) 10 MG tablet Take 1 tablet (10 mg total) by mouth every 6 (six) hours as needed for nausea or vomiting. 30 tablet 1  . rivaroxaban (XARELTO) 20 MG TABS tablet Take 1 tablet (20 mg total) by mouth daily with supper. 30 tablet 3  . sertraline (ZOLOFT) 100 MG tablet Take 150 mg by mouth at bedtime.     .Marland Kitchenspironolactone (ALDACTONE) 25 MG tablet Take 25 mg by mouth daily.   4  . tiotropium (SPIRIVA) 18 MCG inhalation capsule Place 1 capsule (18 mcg total) into inhaler and inhale daily. 30 capsule 12   No current facility-administered medications for this visit.   Facility-Administered Medications Ordered in Other Visits  Medication Dose Route Frequency Provider Last Rate Last Dose  . sodium chloride 0.9 % injection 10 mL  10 mL Intracatheter PRN Curt Bears, MD   10 mL at 11/07/14 1558    SURGICAL HISTORY:  Past Surgical History  Procedure Laterality Date  . Thyroidectomy, partial    . Tonsillectomy    . Tubal ligation    . Partial hysterectomy    . Tonsillectomy    . Laryngoplasty Left 07/22/2015    Procedure: LEFT VOCAL CORD MEDIALIZATION;  Surgeon: Rozetta Nunnery, MD;  Location: [facility];  Service: ENT;  Laterality: Left;    REVIEW OF SYSTEMS:  Constitutional: positive for fatigue Eyes: negative Ears, nose, mouth, throat, and face: positive for hoarseness Respiratory: positive for cough and dyspnea on exertion Cardiovascular: negative Gastrointestinal: negative Genitourinary:negative Integument/breast: negative Hematologic/lymphatic: negative Musculoskeletal:negative Neurological: negative Behavioral/Psych: negative Endocrine: negative Allergic/Immunologic: negative   PHYSICAL EXAMINATION: General appearance: alert, cooperative, no distress and Swelling of her face and puffiness of her eye Head: Normocephalic, without obvious  abnormality, atraumatic Neck: no adenopathy, no JVD, supple, symmetrical, trachea midline and thyroid not enlarged, symmetric, no tenderness/mass/nodules Lymph nodes: Cervical, supraclavicular, and axillary nodes normal. Resp: wheezes bilaterally Back: symmetric, no curvature. ROM normal. No CVA tenderness. Cardio: regular rate and rhythm, S1, S2 normal, no murmur, click, rub or gallop GI: soft, non-tender; bowel sounds normal; no masses,  no organomegaly Extremities: extremities normal, atraumatic, no cyanosis or edema Neurologic: Alert and oriented X 3, normal strength and tone. Normal symmetric reflexes. Normal coordination and gait  ECOG PERFORMANCE STATUS: 1 - Symptomatic but completely ambulatory  Blood pressure 140/74, pulse 134, temperature 98 F (36.7 C), temperature source Oral, resp. rate 17, height '5\' 7"'$  (1.702 m), weight 143 lb 8 oz (65.091 kg), SpO2 96 %.  LABORATORY DATA: Lab Results  Component Value Date   WBC 16.5* 09/18/2015   HGB 11.8 09/18/2015   HCT 38.1 09/18/2015   MCV 78.1* 09/18/2015   PLT 258 09/18/2015      Chemistry      Component Value Date/Time   NA 136 09/18/2015 1029   NA 138 08/21/2015 0417   NA 141 04/04/2012 0945   K 3.7 09/18/2015 1029   K 4.3 08/21/2015 0417   K 4.7 04/04/2012 0945   CL 95* 08/21/2015 0417   CL 101 04/06/2013 1041   CL 100 04/04/2012 0945   CO2 24 09/18/2015 1029   CO2 30 08/21/2015 0417   CO2 29 04/04/2012 0945   BUN 13.6 09/18/2015 1029   BUN 15 08/21/2015 0417   BUN 16 04/04/2012 0945   CREATININE 0.8 09/18/2015 1029   CREATININE 0.68 08/21/2015 0417   CREATININE 0.9 04/04/2012 0945      Component Value Date/Time   CALCIUM 8.1* 09/18/2015 1029   CALCIUM 8.6* 08/21/2015 0417   CALCIUM 8.4 04/04/2012 0945   ALKPHOS 120 09/18/2015 1029   ALKPHOS 69 08/18/2015 0642   ALKPHOS 61 04/04/2012 0945   AST 17 09/18/2015 1029   AST 31 08/18/2015 0642   AST 31 04/04/2012 0945   ALT 16 09/18/2015 1029   ALT 31  08/18/2015 0642   ALT 30 04/04/2012 0945   BILITOT 0.63 09/18/2015 1029   BILITOT 0.3 08/18/2015 0642   BILITOT 0.50 04/04/2012 0945       RADIOGRAPHIC STUDIES: No results found. ASSESSMENT AND PLAN: This is a very pleasant 59 years old white female with recurrent non-small cell lung cancer status post concurrent chemoradiation as well as  consolidation chemotherapy and she is currently on systemic chemotherapy with single agent gemcitabine status post 4 cycles.  She was treated again with a course of systemic chemotherapy with single agent gemcitabine for 3 cycles but the patient has rough time tolerating her treatment with significant fatigue and weakness. The recent CT scan of the chest, abdomen and pelvis showed stable disease but the patient continues to have the persistent right upper lobe area of mass fibrosis and consolidation as well as persistent right hilar, subcarinal and right paratracheal lymphadenopathy as well as, one area of masslike soft tissue immediately anterior to the carina. She was started on treatment with Nivolumab status post 2 cycles. She tolerated the treatment well but she presented with evidence for disease progression in the chest. This treatment was discontinued. 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 Further imaging studies showed evidence for disease progression. The patient was restarted on systemic chemotherapy initially with carboplatin and gemcitabine body carboplatin was discontinued secondary to hypersensitivity reaction. She continued on treatment with single agent gemcitabine with reduced dose 800 MG/M2 on days 1 and 8 every 3 weeks status post 10 cycles. Her treatment has been on hold for the last few weeks secondary to Foley, bruises and ecchymosis as well as upper respiratory infection and cellulitis of the right upper extremity. The patient does not feel good enough to proceed with her treatment at  this point.  I will arrange for her to come back for follow-up visit in 2 weeks for reevaluation before resuming her systemic chemotherapy.  She is tolerating her treatment fairly well with no significant adverse effects. For the history of deep venous thrombosis she will continue on Lovenox subcutaneously on daily basis. For pain management, she will continue on oxycodone when necessary. She was advised to call immediately if she has any concerning symptoms in the interval.  All questions were answered. The patient knows to call the clinic with any problems, questions or concerns. We can certainly see the patient much sooner if necessary.   Disclaimer: This note was dictated with voice recognition software. Similar sounding words can inadvertently be transcribed and may not be corrected upon review.

## 2015-09-18 NOTE — Telephone Encounter (Signed)
Gave and printed appts ched and avs for pt for DEC  °

## 2015-09-18 NOTE — Progress Notes (Signed)
treatment cancelled

## 2015-09-18 NOTE — Telephone Encounter (Signed)
Per staff message and POF I have scheduled appts. Advised scheduler of appts and first available given. JMW  

## 2015-09-19 ENCOUNTER — Other Ambulatory Visit (HOSPITAL_COMMUNITY): Payer: Self-pay

## 2015-09-19 ENCOUNTER — Inpatient Hospital Stay (HOSPITAL_COMMUNITY)
Admission: EM | Admit: 2015-09-19 | Discharge: 2015-10-19 | DRG: 871 | Disposition: E | Payer: Medicare Other | Attending: Internal Medicine | Admitting: Internal Medicine

## 2015-09-19 ENCOUNTER — Other Ambulatory Visit: Payer: Self-pay

## 2015-09-19 ENCOUNTER — Encounter (HOSPITAL_COMMUNITY): Payer: Self-pay | Admitting: Emergency Medicine

## 2015-09-19 ENCOUNTER — Emergency Department (HOSPITAL_COMMUNITY): Payer: Medicare Other

## 2015-09-19 DIAGNOSIS — F329 Major depressive disorder, single episode, unspecified: Secondary | ICD-10-CM | POA: Diagnosis present

## 2015-09-19 DIAGNOSIS — A419 Sepsis, unspecified organism: Principal | ICD-10-CM | POA: Diagnosis present

## 2015-09-19 DIAGNOSIS — J9809 Other diseases of bronchus, not elsewhere classified: Secondary | ICD-10-CM | POA: Diagnosis present

## 2015-09-19 DIAGNOSIS — J441 Chronic obstructive pulmonary disease with (acute) exacerbation: Secondary | ICD-10-CM | POA: Diagnosis present

## 2015-09-19 DIAGNOSIS — Z794 Long term (current) use of insulin: Secondary | ICD-10-CM

## 2015-09-19 DIAGNOSIS — E1165 Type 2 diabetes mellitus with hyperglycemia: Secondary | ICD-10-CM | POA: Diagnosis present

## 2015-09-19 DIAGNOSIS — I82629 Acute embolism and thrombosis of deep veins of unspecified upper extremity: Secondary | ICD-10-CM | POA: Diagnosis not present

## 2015-09-19 DIAGNOSIS — Z515 Encounter for palliative care: Secondary | ICD-10-CM | POA: Insufficient documentation

## 2015-09-19 DIAGNOSIS — Z881 Allergy status to other antibiotic agents status: Secondary | ICD-10-CM

## 2015-09-19 DIAGNOSIS — R739 Hyperglycemia, unspecified: Secondary | ICD-10-CM

## 2015-09-19 DIAGNOSIS — C3490 Malignant neoplasm of unspecified part of unspecified bronchus or lung: Secondary | ICD-10-CM

## 2015-09-19 DIAGNOSIS — J40 Bronchitis, not specified as acute or chronic: Secondary | ICD-10-CM | POA: Diagnosis present

## 2015-09-19 DIAGNOSIS — Z888 Allergy status to other drugs, medicaments and biological substances status: Secondary | ICD-10-CM | POA: Diagnosis not present

## 2015-09-19 DIAGNOSIS — Z880 Allergy status to penicillin: Secondary | ICD-10-CM | POA: Diagnosis not present

## 2015-09-19 DIAGNOSIS — Z923 Personal history of irradiation: Secondary | ICD-10-CM | POA: Diagnosis not present

## 2015-09-19 DIAGNOSIS — Z7951 Long term (current) use of inhaled steroids: Secondary | ICD-10-CM

## 2015-09-19 DIAGNOSIS — Z885 Allergy status to narcotic agent status: Secondary | ICD-10-CM

## 2015-09-19 DIAGNOSIS — B373 Candidiasis of vulva and vagina: Secondary | ICD-10-CM | POA: Diagnosis present

## 2015-09-19 DIAGNOSIS — Z9221 Personal history of antineoplastic chemotherapy: Secondary | ICD-10-CM

## 2015-09-19 DIAGNOSIS — I4581 Long QT syndrome: Secondary | ICD-10-CM | POA: Diagnosis present

## 2015-09-19 DIAGNOSIS — Z7952 Long term (current) use of systemic steroids: Secondary | ICD-10-CM | POA: Diagnosis not present

## 2015-09-19 DIAGNOSIS — Z86711 Personal history of pulmonary embolism: Secondary | ICD-10-CM | POA: Diagnosis not present

## 2015-09-19 DIAGNOSIS — M1990 Unspecified osteoarthritis, unspecified site: Secondary | ICD-10-CM | POA: Diagnosis present

## 2015-09-19 DIAGNOSIS — I4891 Unspecified atrial fibrillation: Secondary | ICD-10-CM | POA: Diagnosis present

## 2015-09-19 DIAGNOSIS — Y92231 Patient bathroom in hospital as the place of occurrence of the external cause: Secondary | ICD-10-CM | POA: Diagnosis not present

## 2015-09-19 DIAGNOSIS — Z882 Allergy status to sulfonamides status: Secondary | ICD-10-CM | POA: Diagnosis not present

## 2015-09-19 DIAGNOSIS — B370 Candidal stomatitis: Secondary | ICD-10-CM | POA: Diagnosis present

## 2015-09-19 DIAGNOSIS — Z800 Family history of malignant neoplasm of digestive organs: Secondary | ICD-10-CM | POA: Diagnosis not present

## 2015-09-19 DIAGNOSIS — E785 Hyperlipidemia, unspecified: Secondary | ICD-10-CM | POA: Diagnosis present

## 2015-09-19 DIAGNOSIS — C3411 Malignant neoplasm of upper lobe, right bronchus or lung: Secondary | ICD-10-CM | POA: Diagnosis present

## 2015-09-19 DIAGNOSIS — R000 Tachycardia, unspecified: Secondary | ICD-10-CM | POA: Diagnosis present

## 2015-09-19 DIAGNOSIS — Z0000 Encounter for general adult medical examination without abnormal findings: Secondary | ICD-10-CM

## 2015-09-19 DIAGNOSIS — E119 Type 2 diabetes mellitus without complications: Secondary | ICD-10-CM | POA: Diagnosis not present

## 2015-09-19 DIAGNOSIS — X58XXXA Exposure to other specified factors, initial encounter: Secondary | ICD-10-CM | POA: Diagnosis not present

## 2015-09-19 DIAGNOSIS — W19XXXA Unspecified fall, initial encounter: Secondary | ICD-10-CM | POA: Diagnosis not present

## 2015-09-19 DIAGNOSIS — C3410 Malignant neoplasm of upper lobe, unspecified bronchus or lung: Secondary | ICD-10-CM

## 2015-09-19 DIAGNOSIS — Z833 Family history of diabetes mellitus: Secondary | ICD-10-CM | POA: Diagnosis not present

## 2015-09-19 DIAGNOSIS — Z87891 Personal history of nicotine dependence: Secondary | ICD-10-CM

## 2015-09-19 DIAGNOSIS — I480 Paroxysmal atrial fibrillation: Secondary | ICD-10-CM | POA: Diagnosis present

## 2015-09-19 DIAGNOSIS — Z79899 Other long term (current) drug therapy: Secondary | ICD-10-CM

## 2015-09-19 DIAGNOSIS — I871 Compression of vein: Secondary | ICD-10-CM | POA: Diagnosis present

## 2015-09-19 DIAGNOSIS — Z7982 Long term (current) use of aspirin: Secondary | ICD-10-CM

## 2015-09-19 DIAGNOSIS — J440 Chronic obstructive pulmonary disease with acute lower respiratory infection: Secondary | ICD-10-CM | POA: Diagnosis present

## 2015-09-19 DIAGNOSIS — Z66 Do not resuscitate: Secondary | ICD-10-CM | POA: Diagnosis present

## 2015-09-19 DIAGNOSIS — R0602 Shortness of breath: Secondary | ICD-10-CM | POA: Diagnosis present

## 2015-09-19 DIAGNOSIS — J9621 Acute and chronic respiratory failure with hypoxia: Secondary | ICD-10-CM | POA: Diagnosis not present

## 2015-09-19 DIAGNOSIS — E039 Hypothyroidism, unspecified: Secondary | ICD-10-CM | POA: Diagnosis present

## 2015-09-19 DIAGNOSIS — Z79891 Long term (current) use of opiate analgesic: Secondary | ICD-10-CM | POA: Diagnosis not present

## 2015-09-19 DIAGNOSIS — F32A Depression, unspecified: Secondary | ICD-10-CM | POA: Diagnosis present

## 2015-09-19 DIAGNOSIS — J9622 Acute and chronic respiratory failure with hypercapnia: Secondary | ICD-10-CM | POA: Diagnosis not present

## 2015-09-19 DIAGNOSIS — R52 Pain, unspecified: Secondary | ICD-10-CM

## 2015-09-19 DIAGNOSIS — I82409 Acute embolism and thrombosis of unspecified deep veins of unspecified lower extremity: Secondary | ICD-10-CM | POA: Diagnosis present

## 2015-09-19 DIAGNOSIS — T17920A Food in respiratory tract, part unspecified causing asphyxiation, initial encounter: Secondary | ICD-10-CM | POA: Diagnosis not present

## 2015-09-19 DIAGNOSIS — I5032 Chronic diastolic (congestive) heart failure: Secondary | ICD-10-CM | POA: Diagnosis present

## 2015-09-19 DIAGNOSIS — F411 Generalized anxiety disorder: Secondary | ICD-10-CM | POA: Diagnosis present

## 2015-09-19 DIAGNOSIS — Z7901 Long term (current) use of anticoagulants: Secondary | ICD-10-CM | POA: Diagnosis not present

## 2015-09-19 DIAGNOSIS — J3801 Paralysis of vocal cords and larynx, unilateral: Secondary | ICD-10-CM | POA: Diagnosis present

## 2015-09-19 DIAGNOSIS — L03113 Cellulitis of right upper limb: Secondary | ICD-10-CM | POA: Diagnosis present

## 2015-09-19 DIAGNOSIS — J189 Pneumonia, unspecified organism: Secondary | ICD-10-CM | POA: Diagnosis present

## 2015-09-19 DIAGNOSIS — Z86718 Personal history of other venous thrombosis and embolism: Secondary | ICD-10-CM

## 2015-09-19 DIAGNOSIS — J9620 Acute and chronic respiratory failure, unspecified whether with hypoxia or hypercapnia: Secondary | ICD-10-CM | POA: Diagnosis present

## 2015-09-19 DIAGNOSIS — L089 Local infection of the skin and subcutaneous tissue, unspecified: Secondary | ICD-10-CM | POA: Diagnosis present

## 2015-09-19 LAB — CBC WITH DIFFERENTIAL/PLATELET
BASOS ABS: 0 10*3/uL (ref 0.0–0.1)
BASOS PCT: 0 %
EOS ABS: 0 10*3/uL (ref 0.0–0.7)
Eosinophils Relative: 0 %
HCT: 40.5 % (ref 36.0–46.0)
HEMOGLOBIN: 12.6 g/dL (ref 12.0–15.0)
Lymphocytes Relative: 7 %
Lymphs Abs: 1.6 10*3/uL (ref 0.7–4.0)
MCH: 25 pg — ABNORMAL LOW (ref 26.0–34.0)
MCHC: 31.1 g/dL (ref 30.0–36.0)
MCV: 80.5 fL (ref 78.0–100.0)
MONO ABS: 1.5 10*3/uL — AB (ref 0.1–1.0)
MONOS PCT: 7 %
NEUTROS PCT: 86 %
Neutro Abs: 18.5 10*3/uL — ABNORMAL HIGH (ref 1.7–7.7)
Platelets: 249 10*3/uL (ref 150–400)
RBC: 5.03 MIL/uL (ref 3.87–5.11)
RDW: 16.9 % — AB (ref 11.5–15.5)
WBC: 21.6 10*3/uL — ABNORMAL HIGH (ref 4.0–10.5)

## 2015-09-19 LAB — URINALYSIS, ROUTINE W REFLEX MICROSCOPIC
BILIRUBIN URINE: NEGATIVE
Glucose, UA: >1000 mg/dL — AB
Hgb urine dipstick: NEGATIVE
KETONES UR: 15 mg/dL — AB
NITRITE: NEGATIVE
Protein, ur: NEGATIVE mg/dL
Specific Gravity, Urine: 1.029 (ref 1.005–1.030)
pH: 5.5 (ref 5.0–8.0)

## 2015-09-19 LAB — COMPREHENSIVE METABOLIC PANEL
ALK PHOS: 114 U/L (ref 38–126)
ALT: 22 U/L (ref 14–54)
AST: 23 U/L (ref 15–41)
Albumin: 3.3 g/dL — ABNORMAL LOW (ref 3.5–5.0)
Anion gap: 15 (ref 5–15)
BILIRUBIN TOTAL: 0.8 mg/dL (ref 0.3–1.2)
BUN: 18 mg/dL (ref 6–20)
CALCIUM: 8 mg/dL — AB (ref 8.9–10.3)
CO2: 23 mmol/L (ref 22–32)
CREATININE: 0.83 mg/dL (ref 0.44–1.00)
Chloride: 94 mmol/L — ABNORMAL LOW (ref 101–111)
Glucose, Bld: 553 mg/dL (ref 65–99)
Potassium: 3.7 mmol/L (ref 3.5–5.1)
SODIUM: 132 mmol/L — AB (ref 135–145)
TOTAL PROTEIN: 6.9 g/dL (ref 6.5–8.1)

## 2015-09-19 LAB — URINE MICROSCOPIC-ADD ON: RBC / HPF: NONE SEEN RBC/hpf (ref 0–5)

## 2015-09-19 LAB — I-STAT CG4 LACTIC ACID, ED
LACTIC ACID, VENOUS: 1.95 mmol/L (ref 0.5–2.0)
Lactic Acid, Venous: 3.1 mmol/L (ref 0.5–2.0)

## 2015-09-19 LAB — CBG MONITORING, ED: GLUCOSE-CAPILLARY: 391 mg/dL — AB (ref 65–99)

## 2015-09-19 MED ORDER — DEXTROSE 5 % IV SOLN
2.0000 g | Freq: Three times a day (TID) | INTRAVENOUS | Status: DC
  Administered 2015-09-20 – 2015-09-23 (×10): 2 g via INTRAVENOUS
  Filled 2015-09-19 (×11): qty 2

## 2015-09-19 MED ORDER — METOPROLOL TARTRATE 1 MG/ML IV SOLN
5.0000 mg | Freq: Once | INTRAVENOUS | Status: DC
  Filled 2015-09-19: qty 5

## 2015-09-19 MED ORDER — ONDANSETRON HCL 4 MG/2ML IJ SOLN
4.0000 mg | Freq: Once | INTRAMUSCULAR | Status: AC
  Administered 2015-09-19: 4 mg via INTRAVENOUS
  Filled 2015-09-19: qty 2

## 2015-09-19 MED ORDER — ACETAMINOPHEN 325 MG PO TABS
650.0000 mg | ORAL_TABLET | Freq: Four times a day (QID) | ORAL | Status: DC | PRN
  Administered 2015-09-21 – 2015-10-04 (×2): 650 mg via ORAL
  Filled 2015-09-19 (×2): qty 2

## 2015-09-19 MED ORDER — GUAIFENESIN ER 600 MG PO TB12
1200.0000 mg | ORAL_TABLET | Freq: Two times a day (BID) | ORAL | Status: DC
Start: 2015-09-19 — End: 2015-10-04
  Administered 2015-09-20 – 2015-10-04 (×30): 1200 mg via ORAL
  Filled 2015-09-19 (×38): qty 2

## 2015-09-19 MED ORDER — LEVOTHYROXINE SODIUM 137 MCG PO TABS
137.0000 ug | ORAL_TABLET | Freq: Every day | ORAL | Status: DC
Start: 2015-09-20 — End: 2015-10-04
  Administered 2015-09-20 – 2015-10-04 (×15): 137 ug via ORAL
  Filled 2015-09-19 (×20): qty 1

## 2015-09-19 MED ORDER — MAGNESIUM OXIDE 400 (241.3 MG) MG PO TABS
400.0000 mg | ORAL_TABLET | Freq: Every day | ORAL | Status: DC
Start: 2015-09-20 — End: 2015-10-04
  Administered 2015-09-20 – 2015-10-04 (×15): 400 mg via ORAL
  Filled 2015-09-19 (×15): qty 1

## 2015-09-19 MED ORDER — TIOTROPIUM BROMIDE MONOHYDRATE 18 MCG IN CAPS
18.0000 ug | ORAL_CAPSULE | Freq: Every day | RESPIRATORY_TRACT | Status: DC
  Filled 2015-09-19: qty 5

## 2015-09-19 MED ORDER — SERTRALINE HCL 50 MG PO TABS
150.0000 mg | ORAL_TABLET | Freq: Every day | ORAL | Status: DC
  Administered 2015-09-20 – 2015-10-03 (×15): 150 mg via ORAL
  Filled 2015-09-19 (×16): qty 1

## 2015-09-19 MED ORDER — OMEGA-3-ACID ETHYL ESTERS 1 G PO CAPS
1.0000 g | ORAL_CAPSULE | Freq: Two times a day (BID) | ORAL | Status: DC
  Administered 2015-09-20 – 2015-09-27 (×16): 1 g via ORAL
  Filled 2015-09-19 (×18): qty 1

## 2015-09-19 MED ORDER — MSM 1000 MG PO CAPS: 1000.0000 mg | ORAL_CAPSULE | Freq: Every day | ORAL | Status: DC

## 2015-09-19 MED ORDER — ASPIRIN 81 MG PO CHEW
81.0000 mg | CHEWABLE_TABLET | Freq: Every day | ORAL | Status: DC
  Administered 2015-09-20 – 2015-10-04 (×15): 81 mg via ORAL
  Filled 2015-09-19 (×18): qty 1

## 2015-09-19 MED ORDER — IPRATROPIUM BROMIDE 0.02 % IN SOLN
0.5000 mg | RESPIRATORY_TRACT | Status: DC
Start: 2015-09-20 — End: 2015-09-20
  Administered 2015-09-20: 0.5 mg via RESPIRATORY_TRACT
  Filled 2015-09-19: qty 2.5

## 2015-09-19 MED ORDER — LORAZEPAM 1 MG PO TABS
1.0000 mg | ORAL_TABLET | Freq: Three times a day (TID) | ORAL | Status: DC | PRN
  Administered 2015-09-20 – 2015-10-03 (×23): 1 mg via ORAL
  Filled 2015-09-19 (×24): qty 1

## 2015-09-19 MED ORDER — CEFTRIAXONE SODIUM 1 G IJ SOLR
1.0000 g | Freq: Once | INTRAMUSCULAR | Status: AC
  Administered 2015-09-19: 1 g via INTRAVENOUS
  Filled 2015-09-19: qty 10

## 2015-09-19 MED ORDER — SODIUM CHLORIDE 0.9 % IV BOLUS (SEPSIS)
1500.0000 mL | INTRAVENOUS | Status: AC
Start: 2015-09-19 — End: 2015-09-20
  Administered 2015-09-20: 1500 mL via INTRAVENOUS

## 2015-09-19 MED ORDER — SODIUM CHLORIDE 0.9 % IV BOLUS (SEPSIS)
1000.0000 mL | INTRAVENOUS | Status: AC
  Administered 2015-09-19 (×2): 1000 mL via INTRAVENOUS

## 2015-09-19 MED ORDER — LEVALBUTEROL HCL 1.25 MG/0.5ML IN NEBU
1.2500 mg | INHALATION_SOLUTION | Freq: Four times a day (QID) | RESPIRATORY_TRACT | Status: DC
  Administered 2015-09-20: 1.25 mg via RESPIRATORY_TRACT
  Filled 2015-09-19: qty 0.5

## 2015-09-19 MED ORDER — CYANOCOBALAMIN 500 MCG PO TABS
1500.0000 ug | ORAL_TABLET | Freq: Every day | ORAL | Status: DC
  Administered 2015-09-20 – 2015-10-04 (×15): 1500 ug via ORAL
  Filled 2015-09-19 (×15): qty 3

## 2015-09-19 MED ORDER — DILTIAZEM HCL 100 MG IV SOLR
5.0000 mg/h | INTRAVENOUS | Status: DC
  Administered 2015-09-19: 10 mg/h via INTRAVENOUS
  Filled 2015-09-19: qty 100

## 2015-09-19 MED ORDER — ALBUTEROL (5 MG/ML) CONTINUOUS INHALATION SOLN
10.0000 mg/h | INHALATION_SOLUTION | Freq: Once | RESPIRATORY_TRACT | Status: AC
Start: 2015-09-19 — End: 2015-09-19
  Administered 2015-09-19: 10 mg/h via RESPIRATORY_TRACT
  Filled 2015-09-19: qty 20

## 2015-09-19 MED ORDER — OXYCODONE HCL 5 MG PO TABS
5.0000 mg | ORAL_TABLET | ORAL | Status: DC | PRN
  Administered 2015-09-20 – 2015-10-04 (×32): 5 mg via ORAL
  Filled 2015-09-19 (×35): qty 1

## 2015-09-19 MED ORDER — DEXTROSE 5 % IV SOLN
500.0000 mg | Freq: Once | INTRAVENOUS | Status: AC
  Administered 2015-09-19: 500 mg via INTRAVENOUS
  Filled 2015-09-19: qty 500

## 2015-09-19 MED ORDER — HYDROCOD POLST-CPM POLST ER 10-8 MG/5ML PO SUER
5.0000 mL | Freq: Two times a day (BID) | ORAL | Status: DC | PRN
  Administered 2015-09-20 – 2015-10-04 (×14): 5 mL via ORAL
  Filled 2015-09-19 (×15): qty 5

## 2015-09-19 MED ORDER — BUDESONIDE-FORMOTEROL FUMARATE 160-4.5 MCG/ACT IN AERO
2.0000 | INHALATION_SPRAY | Freq: Two times a day (BID) | RESPIRATORY_TRACT | Status: DC
  Administered 2015-09-20 (×2): 2 via RESPIRATORY_TRACT
  Filled 2015-09-19: qty 6

## 2015-09-19 MED ORDER — RIVAROXABAN 20 MG PO TABS
20.0000 mg | ORAL_TABLET | Freq: Every day | ORAL | Status: DC
  Administered 2015-09-20 – 2015-10-03 (×14): 20 mg via ORAL
  Filled 2015-09-19 (×16): qty 1

## 2015-09-19 MED ORDER — DILTIAZEM LOAD VIA INFUSION
10.0000 mg | Freq: Once | INTRAVENOUS | Status: AC
  Administered 2015-09-19: 10 mg via INTRAVENOUS
  Filled 2015-09-19: qty 10

## 2015-09-19 MED ORDER — ADULT MULTIVITAMIN W/MINERALS CH
1.0000 | ORAL_TABLET | Freq: Every day | ORAL | Status: DC
  Administered 2015-09-20 – 2015-10-04 (×15): 1 via ORAL
  Filled 2015-09-19 (×15): qty 1

## 2015-09-19 MED ORDER — FENTANYL CITRATE (PF) 100 MCG/2ML IJ SOLN
100.0000 ug | INTRAMUSCULAR | Status: AC | PRN
  Administered 2015-09-19 – 2015-09-20 (×4): 100 ug via INTRAVENOUS
  Filled 2015-09-19 (×4): qty 2

## 2015-09-19 MED ORDER — INSULIN GLARGINE 100 UNIT/ML ~~LOC~~ SOLN
5.0000 [IU] | Freq: Every day | SUBCUTANEOUS | Status: DC
  Administered 2015-09-20: 5 [IU] via SUBCUTANEOUS
  Filled 2015-09-19 (×2): qty 0.05

## 2015-09-19 MED ORDER — METHYLPREDNISOLONE SODIUM SUCC 125 MG IJ SOLR
60.0000 mg | Freq: Three times a day (TID) | INTRAMUSCULAR | Status: DC
  Administered 2015-09-20 – 2015-09-21 (×5): 60 mg via INTRAVENOUS
  Filled 2015-09-19 (×5): qty 2

## 2015-09-19 MED ORDER — SODIUM CHLORIDE 0.9 % IJ SOLN
3.0000 mL | Freq: Two times a day (BID) | INTRAMUSCULAR | Status: DC
  Administered 2015-09-20 – 2015-10-03 (×9): 3 mL via INTRAVENOUS

## 2015-09-19 MED ORDER — INSULIN ASPART 100 UNIT/ML ~~LOC~~ SOLN: 0.0000 [IU] | Freq: Three times a day (TID) | SUBCUTANEOUS | Status: DC

## 2015-09-19 MED ORDER — SODIUM CHLORIDE 0.9 % IV SOLN
INTRAVENOUS | Status: AC
  Administered 2015-09-20: 02:00:00 via INTRAVENOUS

## 2015-09-19 MED ORDER — VANCOMYCIN HCL IN DEXTROSE 750-5 MG/150ML-% IV SOLN
750.0000 mg | Freq: Two times a day (BID) | INTRAVENOUS | Status: DC
  Administered 2015-09-20 – 2015-09-22 (×5): 750 mg via INTRAVENOUS
  Filled 2015-09-19 (×7): qty 150

## 2015-09-19 MED ORDER — VITAMIN D 1000 UNITS PO TABS
1000.0000 [IU] | ORAL_TABLET | Freq: Every day | ORAL | Status: DC
Start: 2015-09-20 — End: 2015-10-04
  Administered 2015-09-20 – 2015-10-04 (×15): 1000 [IU] via ORAL
  Filled 2015-09-19 (×18): qty 1

## 2015-09-19 MED ORDER — ATORVASTATIN CALCIUM 10 MG PO TABS
20.0000 mg | ORAL_TABLET | Freq: Every day | ORAL | Status: DC
  Administered 2015-09-20 – 2015-10-03 (×15): 20 mg via ORAL
  Filled 2015-09-19 (×2): qty 2
  Filled 2015-09-19: qty 1
  Filled 2015-09-19 (×9): qty 2
  Filled 2015-09-19: qty 1
  Filled 2015-09-19 (×5): qty 2
  Filled 2015-09-19: qty 1

## 2015-09-19 MED ORDER — HYDROXYZINE HCL 50 MG/ML IM SOLN
25.0000 mg | Freq: Four times a day (QID) | INTRAMUSCULAR | Status: DC | PRN
  Filled 2015-09-19: qty 0.5

## 2015-09-19 MED ORDER — ALENDRONATE SODIUM 10 MG PO TABS: 10.0000 mg | ORAL_TABLET | Freq: Every day | ORAL | Status: DC

## 2015-09-19 NOTE — ED Notes (Signed)
Pt BIB EMS; pt became short of breath and became congested this AM; pt has hx of lung cancer but not asthma; pt does not usually wear oxygen but was put on 2L O2 in the ambulance; pt audibly wheezing; pt also has "skin infection" on right forearm that she was seen for on Wednesday; pt started doxycycline yesterday for this infection.

## 2015-09-19 NOTE — ED Provider Notes (Signed)
CSN: [id number]     Arrival date & time 09/21/2015  1456 History   First MD Initiated Contact with Patient 09/27/2015 1514     Chief Complaint  Patient presents with  . Shortness of Breath  . Nasal Congestion     (Consider location/radiation/quality/duration/timing/severity/associated sxs/prior Treatment) HPI   Janice Ball is a 59 y.o. female who presents for evaluation of chest discomfort, coughing, and wheezing. Janice Ball is using her usual medications at home without relief. Janice Ball completed a course of Levaquin, yesterday. This was given to her by her primary care doctor, for bronchitis. Her cough is productive. Janice Ball denies fever. Her chest pain as a soreness that occurs when Janice Ball coughs. Janice Ball's been able to eat today. No similar recent problem. Janice Ball was evaluated by her oncologist, yesterday, at that time Janice Ball was on a drug holiday, for lung cancer. The plan was to wait another couple weeks to see if her stamina improved, which would possibly allow her to have further chemotherapy. There are no other known modifying factors.   Past Medical History  Diagnosis Date  . Anxiety   . Hyperlipidemia   . Hypothyroidism   . COPD (chronic obstructive pulmonary disease) ([place])   . Arthritis     thumbs  . 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 Lung cancer ([place]) dx'd 05/2009  . Lung cancer ([place]) dx'd 09/2013    recurrence in LN  . Diabetes mellitus   . Pulmonary embolism ([place])     'I've had a blood clot in my chest'  . DNR no code (do not resuscitate) 07/31/2015   Past Surgical History  Procedure Laterality Date  . Thyroidectomy, partial    . Tonsillectomy    . Tubal ligation    . Partial hysterectomy    . Tonsillectomy    . Laryngoplasty Left 07/22/2015    Procedure: LEFT VOCAL CORD MEDIALIZATION;  Surgeon: Rozetta Nunnery, MD;  Location: [facility];  Service: ENT;  Laterality:  Left;   Family History  Problem Relation Age of Onset  . Colon cancer Mother   . Cancer Mother     colon  . Diabetes type II Father    Social History  Substance Use Topics  . Smoking status: Former Smoker -- 0.50 packs/day for 45 years    Types: Cigarettes    Quit date: 11/03/2014  . Smokeless tobacco: Never Used     Comment: pt. started back smoking 10/18/2012, half a pack a day  . Alcohol Use: No   OB History    No data available     Review of Systems  All other systems reviewed and are negative.     Allergies  Carboplatin; Sulfonamide derivatives; Codeine; Dilaudid; Keflex; Penicillins; and Vicodin  Home Medications   Prior to Admission medications   Medication Sig Start Date End Date Taking? Authorizing Provider  alendronate (FOSAMAX) 10 MG tablet Take 10 mg by mouth daily.  03/20/14  Yes Historical Provider, MD  aspirin 81 MG tablet Take 81 mg by mouth daily.   Yes Historical Provider, MD  atorvastatin (LIPITOR) 20 MG tablet Take 20 mg by mouth at bedtime.   Yes Historical Provider, MD  Bioflavonoid Products (ESTER C PO) Take 1 tablet by mouth 2 (two) times daily. Ester-C Calcium Ascorbate (Vitamin C 500 mg and Calcium 55 mg)   Yes Historical Provider, MD  budesonide-formoterol (SYMBICORT) 160-4.5  MCG/ACT inhaler Inhale 2 puffs into the lungs 2 (two) times daily.   Yes Historical Provider, MD  cholecalciferol (VITAMIN D) 1000 UNITS tablet Take 1,000 Units by mouth daily.    Yes Historical Provider, MD  [employer] NATURAL LUTEIN EYE HEALTH PO Take 1 tablet by mouth at bedtime.   Yes Historical Provider, MD  Cyanocobalamin 1500 MCG TBDP Take 1,500 mcg by mouth daily. Vitamin B12   Yes Historical Provider, MD  doxycycline (VIBRAMYCIN) 100 MG capsule Take 100 mg by mouth 2 (two) times daily.  09/16/15  Yes Historical Provider, MD  glimepiride (AMARYL) 2 MG tablet Take 2 tablets (4 mg total) by mouth daily with breakfast. Take higher dose (56mby mouth) while on prednisone. Once off  prednisone go back to home regimen of 2 mg by mouth daily. 08/10/15  Yes PDomenic Polite MD  Guaifenesin ([facility] STRENGTH) 1200 MG TB12 Take 1,200 mg by mouth 2 (two) times daily.   Yes Historical Provider, MD  levothyroxine (SYNTHROID, LEVOTHROID) 137 MCG tablet Take 137 mcg by mouth daily.  01/15/15  Yes Historical Provider, MD  LORazepam (ATIVAN) 1 MG tablet Take 1 mg by mouth 3 (three) times daily as needed for anxiety.    Yes Historical Provider, MD  Magnesium Oxide 500 MG TABS Take 500 mg by mouth daily.   Yes Historical Provider, MD  metFORMIN (GLUCOPHAGE) 1000 MG tablet Take 1,000 mg by mouth 2 (two) times daily with a meal.   Yes Historical Provider, MD  Methylsulfonylmethane (MSM) 1000 MG CAPS Take 1,000 mg by mouth daily.   Yes Historical Provider, MD  Multiple Vitamin (MULTIVITAMIN WITH MINERALS) TABS tablet Take 1 tablet by mouth daily. Spectravite   Yes Historical Provider, MD  omega-3 acid ethyl esters (LOVAZA) 1 G capsule Take 1 g by mouth 2 (two) times daily.    Yes Historical Provider, MD  oxyCODONE (OXY IR/ROXICODONE) 5 MG immediate release tablet Take 1 tablet (5 mg total) by mouth every 4 (four) hours as needed for severe pain. 09/10/15  Yes MCurt Bears MD  predniSONE (DELTASONE) 10 MG tablet 6 tab x3 days 5tab x3 days 4tabs x3 days 3tab x3 days 2tabs x3 days 1 tab x3 days 09/03/15  Yes Praveen Mannam, MD  rivaroxaban (XARELTO) 20 MG TABS tablet Take 1 tablet (20 mg total) by mouth daily with supper. 07/07/15  Yes MCurt Bears MD  sertraline (ZOLOFT) 100 MG tablet Take 150 mg by mouth at bedtime.    Yes Historical Provider, MD  spironolactone (ALDACTONE) 25 MG tablet Take 25 mg by mouth daily.  05/27/15  Yes Historical Provider, MD  tiotropium (SPIRIVA) 18 MCG inhalation capsule Place 1 capsule (18 mcg total) into inhaler and inhale daily. 04/26/15  Yes OVelvet Bathe MD  acetaminophen (TYLENOL) 325 MG tablet Take 650 mg by mouth every 6 (six) hours as needed for mild  pain or headache.     Historical Provider, MD  benzonatate (TESSALON) 200 MG capsule Take 1 capsule (200 mg total) by mouth 3 (three) times daily as needed for cough. Patient not taking: Reported on 09/27/2015 08/21/15   MJanece Canterbury MD  benzonatate (TESSALON) 200 MG capsule Take 1 capsule (200 mg total) by mouth 3 (three) times daily as needed for cough. 09/03/15   PMarshell Garfinkel MD  chlorpheniramine-HYDROcodone (TUSSIONEX PENNKINETIC ER) 10-8 MG/5ML SUER Take 5 mLs by mouth 2 (two) times daily as needed for cough. 09/03/15   Praveen Mannam, MD  levofloxacin (LEVAQUIN) 500 MG tablet Take 1 tablet (  500 mg total) by mouth daily. Patient not taking: Reported on 10/10/2015 09/03/15   Praveen Mannam, MD  PROAIR HFA 108 (90 BASE) MCG/ACT inhaler INHALE 2 PUFFS INTO THE LUNGS EVERY 6 HOURS AS NEEDED FOR WHEEZING OR SHORTNESS OF BREATH. 06/17/15   Brand Males, MD  prochlorperazine (COMPAZINE) 10 MG tablet Take 1 tablet (10 mg total) by mouth every 6 (six) hours as needed for nausea or vomiting. 06/25/15   Curt Bears, MD   BP 109/58 mmHg  Pulse 101  Temp(Src) 98.7 F (37.1 C) (Oral)  Resp 16  Ht '5\' 7"'$  (1.702 m)  Wt 144 lb 2.9 oz (65.4 kg)  BMI 22.58 kg/m2  SpO2 96% Physical Exam  Constitutional: Janice Ball is oriented to person, place, and time. Janice Ball appears well-developed.  Janice Ball appears older than stated age.  HENT:  Head: Normocephalic and atraumatic.  Right Ear: External ear normal.  Left Ear: External ear normal.  Eyes: Conjunctivae and EOM are normal. Pupils are equal, round, and reactive to light.  Neck: Normal range of motion and phonation normal. Neck supple.  Cardiovascular: Normal heart sounds.   Irregular tachycardia  Pulmonary/Chest: Breath sounds normal. Janice Ball is in respiratory distress (Mild). Janice Ball has no wheezes. Janice Ball has no rales. Janice Ball exhibits no tenderness and no bony tenderness.  Janice Ball has tachypnea  Abdominal: Soft. There is no tenderness.  Musculoskeletal: Normal range of  motion. Janice Ball exhibits no edema or tenderness.  Neurological: Janice Ball is alert and oriented to person, place, and time. No cranial nerve deficit or sensory deficit. Janice Ball exhibits normal muscle tone. Coordination normal.  Skin: Skin is warm, dry and intact.  Psychiatric: Janice Ball has a normal mood and affect. Her behavior is normal. Judgment and thought content normal.  Nursing note and vitals reviewed.   ED Course  Procedures (including critical care time) Medications  diltiazem (CARDIZEM) 1 mg/mL load via infusion 10 mg (10 mg Intravenous Given 10/07/2015 1618)    And  diltiazem (CARDIZEM) 100 mg in dextrose 5 % 100 mL (1 mg/mL) infusion (0 mg/hr Intravenous Stopped 10/05/2015 2126)  0.9 %  sodium chloride infusion ( Intravenous Rate/Dose Verify 09/20/15 0900)  sodium chloride 0.9 % bolus 1,500 mL (1,500 mLs Intravenous Given 09/20/15 0054)  aspirin chewable tablet 81 mg (81 mg Oral Given 09/20/15 0932)  oxyCODONE (Oxy IR/ROXICODONE) immediate release tablet 5 mg (5 mg Oral Given 09/20/15 0210)  chlorpheniramine-HYDROcodone (TUSSIONEX) 10-8 MG/5ML suspension 5 mL (5 mLs Oral Given 09/20/15 0052)  atorvastatin (LIPITOR) tablet 20 mg (20 mg Oral Given 09/20/15 0051)  cyanocobalamin tablet 1,500 mcg (1,500 mcg Oral Given 09/20/15 0934)  guaiFENesin (MUCINEX) 12 hr tablet 1,200 mg (1,200 mg Oral Given 09/20/15 0931)  magnesium oxide (MAG-OX) tablet 400 mg (400 mg Oral Given 09/20/15 0932)  omega-3 acid ethyl esters (LOVAZA) capsule 1 g (1 g Oral Given 09/20/15 0933)  rivaroxaban (XARELTO) tablet 20 mg (not administered)  levothyroxine (SYNTHROID, LEVOTHROID) tablet 137 mcg (137 mcg Oral Given 09/20/15 0933)  budesonide-formoterol (SYMBICORT) 160-4.5 MCG/ACT inhaler 2 puff (2 puffs Inhalation Given 09/20/15 0827)  multivitamin with minerals tablet 1 tablet (1 tablet Oral Given 09/20/15 0932)  acetaminophen (TYLENOL) tablet 650 mg (not administered)  cholecalciferol (VITAMIN D) tablet 1,000 Units (1,000 Units Oral Given  09/20/15 0932)  LORazepam (ATIVAN) tablet 1 mg (1 mg Oral Given 09/20/15 0210)  sertraline (ZOLOFT) tablet 150 mg (150 mg Oral Given 09/20/15 0051)  hydrOXYzine (VISTARIL) injection 25 mg (not administered)  methylPREDNISolone sodium succinate (SOLU-MEDROL) 125 mg/2 mL injection 60 mg (  60 mg Intravenous Given 09/20/15 0531)  sodium chloride 0.9 % injection 3 mL (3 mLs Intravenous Given 09/20/15 0935)  vancomycin (VANCOCIN) IVPB 750 mg/150 ml premix (750 mg Intravenous Given 09/20/15 1202)  aztreonam (AZACTAM) 2 g in dextrose 5 % 50 mL IVPB (2 g Intravenous Given 09/20/15 0300)  levalbuterol (XOPENEX) nebulizer solution 1.25 mg (not administered)  docusate sodium (COLACE) capsule 100 mg (100 mg Oral Given 09/20/15 0932)  ipratropium (ATROVENT) nebulizer solution 0.5 mg (not administered)  ipratropium (ATROVENT) nebulizer solution 0.5 mg (0.5 mg Nebulization Given 09/20/15 0826)  0.9 %  sodium chloride infusion ( Intravenous Rate/Dose Verify 09/20/15 1200)  levalbuterol (XOPENEX) nebulizer solution 1.25 mg (not administered)  insulin aspart (novoLOG) injection 0-20 Units (0 Units Subcutaneous Not Given 09/20/15 1202)  calcium citrate (CALCITRATE - dosed in mg elemental calcium) tablet 200 mg of elemental calcium (200 mg of elemental calcium Oral Given 09/20/15 0949)  insulin glargine (LANTUS) injection 20 Units (not administered)  sodium chloride 0.9 % bolus 1,000 mL (0 mLs Intravenous Stopped 09/18/2015 1754)  cefTRIAXone (ROCEPHIN) 1 g in dextrose 5 % 50 mL IVPB (0 g Intravenous Stopped 09/23/2015 1641)  azithromycin (ZITHROMAX) 500 mg in dextrose 5 % 250 mL IVPB (0 mg Intravenous Stopped 10/10/2015 1759)  albuterol (PROVENTIL,VENTOLIN) solution continuous neb (10 mg/hr Nebulization Given 10/09/2015 1617)  fentaNYL (SUBLIMAZE) injection 100 mcg (100 mcg Intravenous Given 09/20/15 0053)  ondansetron (ZOFRAN) injection 4 mg (4 mg Intravenous Given 09/28/2015 1759)  metoprolol (LOPRESSOR) injection 5 mg (5 mg Intravenous  Given 09/20/15 0935)  insulin glargine (LANTUS) injection 5 Units (5 Units Subcutaneous Given 09/20/15 1251)  insulin aspart (novoLOG) injection 26 Units (26 Units Subcutaneous Given 09/20/15 1202)    Patient Vitals for the past 24 hrs:  BP Temp Temp src Pulse Resp SpO2 Height Weight  09/20/15 1200 (!) 109/58 mmHg 98.7 F (37.1 C) Oral (!) 101 16 96 % - -  09/20/15 1100 (!) 104/43 mmHg - - (!) 103 (!) 21 95 % - -  09/20/15 1000 (!) 153/73 mmHg - - (!) 111 (!) 29 98 % - -  09/20/15 0900 124/65 mmHg - - (!) 118 (!) 21 98 % - -  09/20/15 0800 (!) 149/76 mmHg 98.1 F (36.7 C) Oral (!) 109 (!) 34 99 % - -  09/20/15 0700 128/62 mmHg - - (!) 103 17 98 % - -  09/20/15 0600 123/74 mmHg - - (!) 106 (!) 25 95 % - -  09/20/15 0500 125/70 mmHg - - (!) 107 (!) 24 95 % - -  09/20/15 0400 (!) 149/69 mmHg 98.3 F (36.8 C) Oral (!) 118 (!) 24 97 % - -  09/20/15 0300 (!) 114/59 mmHg - - (!) 109 (!) 22 96 % - -  09/20/15 0200 134/75 mmHg - - (!) 114 18 98 % - -  09/20/15 0100 128/74 mmHg 98.1 F (36.7 C) Oral (!) 117 (!) 25 99 % '5\' 7"'$  (1.702 m) 144 lb 2.9 oz (65.4 kg)  09/20/15 0011 - - - - - 98 % - -  10/11/2015 2230 110/67 mmHg - - 114 21 98 % - -  10/10/2015 2145 126/79 mmHg - - 120 18 97 % - -  09/18/2015 2137 - 98.2 F (36.8 C) Oral - - - - -  10/12/2015 2100 126/71 mmHg - - 117 21 98 % - -  09/28/2015 2032 - - - 120 20 98 % - -  09/20/2015 2016 106/73 mmHg - - (!) 140  21 97 % - -  09/27/2015 1945 122/79 mmHg - - (!) 127 21 96 % - -  10/14/2015 1915 113/58 mmHg - - (!) 156 22 93 % - -  09/18/2015 1900 104/79 mmHg - - (!) 156 23 95 % - -  09/18/2015 1845 111/84 mmHg - - - - - - -  09/22/2015 1838 123/59 mmHg - - (!) 175 23 95 % - -  09/23/2015 1830 123/59 mmHg - - (!) 158 22 95 % - -  09/30/2015 1815 103/84 mmHg - - (!) 54 22 95 % - -  09/23/2015 1814 - - - 82 23 94 % - -  10/01/2015 1756 129/76 mmHg - - (!) 156 24 91 % - -  09/18/2015 1730 121/78 mmHg - - 69 25 98 % - -  10/10/2015 1700 111/62 mmHg - - 102 21 100 % - -   10/14/2015 1633 - - - (!) 156 20 99 % - -  10/17/2015 1632 - 99.7 F (37.6 C) Rectal - - - - -  10/16/2015 1632 119/78 mmHg - - (!) 171 20 100 % - -  09/29/2015 1619 - - - - - 99 % - -  10/15/2015 1618 - - - - - 100 % - -  10/09/2015 1617 - - - (!) 181 (!) 30 99 % - -  10/18/2015 1616 119/98 mmHg - - 102 (!) 28 100 % - -  10/17/2015 1545 114/70 mmHg - - (!) 58 - 98 % - -  10/05/2015 1533 - - - - - - '5\' 7"'$  (1.702 m) 136 lb (61.689 kg)  10/05/2015 1523 117/67 mmHg - - 84 26 100 % - -  09/26/2015 1508 (!) 151/128 mmHg 97.6 F (36.4 C) Oral - 24 99 % - -    4:48 PM Reevaluation with update and discussion. After initial assessment and treatment, an updated evaluation reveals currently on nebulizer, alert, heart rate improved to 170 at this time. Janice Ball is complaining of "crampy" chest pain. Narcotic analgesia ordered. Dequandre Cordova L   21:30- . Janice Ball is now off Cardizem drip and has sinus tachycardia, heart monitor and EKG.  19:45-Janice Ball states that at this time. Janice Ball is feeling somewhat better. Lungs have somewhat improved air movement, but still have marked rhonchi with less wheezing. Janice Ball still markedly tachycardic at 160, on a Cardizem drip at 20 mg per hour. Will give bolus of Lopressor for rate control, and  Reassess.    CRITICAL CARE Performed by: Richarda Blade Total critical care time: 55 minutes Critical care time was exclusive of separately billable procedures and treating other patients. Critical care was necessary to treat or prevent imminent or life-threatening deterioration. Critical care was time spent personally by me on the following activities: development of treatment plan with patient and/or surrogate as well as nursing, discussions with consultants, evaluation of patient's response to treatment, examination of patient, obtaining history from patient or surrogate, ordering and performing treatments and interventions, ordering and review of laboratory studies, ordering and review of radiographic  studies, pulse oximetry and re-evaluation of patient's condition.   Labs Review Labs Reviewed  COMPREHENSIVE METABOLIC PANEL - Abnormal; Notable for the following:    Sodium 132 (*)    Chloride 94 (*)    Glucose, Bld 553 (*)    Calcium 8.0 (*)    Albumin 3.3 (*)    All other components within normal limits  CBC WITH DIFFERENTIAL/PLATELET - Abnormal; Notable for the following:    WBC 21.6 (*)  MCH 25.0 (*)    RDW 16.9 (*)    Neutro Abs 18.5 (*)    Monocytes Absolute 1.5 (*)    All other components within normal limits  URINALYSIS, ROUTINE W REFLEX MICROSCOPIC (NOT AT [facility]) - Abnormal; Notable for the following:    Glucose, UA >1000 (*)    Ketones, ur 15 (*)    Leukocytes, UA SMALL (*)    All other components within normal limits  URINE MICROSCOPIC-ADD ON - Abnormal; Notable for the following:    Squamous Epithelial / LPF 6-30 (*)    Bacteria, UA FEW (*)    Casts HYALINE CASTS (*)    All other components within normal limits  PROTIME-INR - Abnormal; Notable for the following:    Prothrombin Time 16.6 (*)    All other components within normal limits  BASIC METABOLIC PANEL - Abnormal; Notable for the following:    Sodium 133 (*)    Glucose, Bld 387 (*)    Calcium 6.8 (*)    All other components within normal limits  CBC - Abnormal; Notable for the following:    WBC 13.3 (*)    Hemoglobin 10.3 (*)    HCT 33.4 (*)    MCH 25.2 (*)    RDW 17.1 (*)    Platelets 147 (*)    All other components within normal limits  BRAIN NATRIURETIC PEPTIDE - Abnormal; Notable for the following:    B Natriuretic Peptide 220.0 (*)    All other components within normal limits  TROPONIN I - Abnormal; Notable for the following:    Troponin I 0.07 (*)    All other components within normal limits  TROPONIN I - Abnormal; Notable for the following:    Troponin I 0.04 (*)    All other components within normal limits  MAGNESIUM - Abnormal; Notable for the following:    Magnesium 1.5 (*)    All  other components within normal limits  GLUCOSE, CAPILLARY - Abnormal; Notable for the following:    Glucose-Capillary 384 (*)    All other components within normal limits  GLUCOSE, CAPILLARY - Abnormal; Notable for the following:    Glucose-Capillary 459 (*)    All other components within normal limits  I-STAT CG4 LACTIC ACID, ED - Abnormal; Notable for the following:    Lactic Acid, Venous 3.10 (*)    All other components within normal limits  CBG MONITORING, ED - Abnormal; Notable for the following:    Glucose-Capillary 391 (*)    All other components within normal limits  URINE CULTURE  CULTURE, EXPECTORATED SPUTUM-ASSESSMENT  MRSA PCR SCREENING  CULTURE, BLOOD (ROUTINE X 2)  CULTURE, BLOOD (ROUTINE X 2)  GRAM STAIN  CULTURE, RESPIRATORY (NON-EXPECTORATED)  INFLUENZA PANEL BY PCR (TYPE A & B, H1N1)  APTT  LACTIC ACID, PLASMA  PROCALCITONIN  PHOSPHORUS  TROPONIN I  CALCIUM, IONIZED  VITAMIN D 25 HYDROXY (VIT D DEFICIENCY, FRACTURES)  CALCITRIOL (1,25 DI-OH VIT D)  PTH, INTACT AND CALCIUM  I-STAT CG4 LACTIC ACID, ED    Imaging Review Dg Chest Port 1 View  10/18/2015  CLINICAL DATA:  Shortness of breath with congestion today. History of COPD and lung cancer. Initial encounter. EXAM: PORTABLE CHEST 1 VIEW COMPARISON:  Radiographs 08/19/2015.  CT 08/16/2015. FINDINGS: 1549 hours. Mild patient rotation to the right. There is stable volume loss in the right hemithorax with partial right upper lobe collapse. There is stable right paratracheal, right hilar and subcarinal fullness. Right IJ Port-A-Cath tip is  unchanged near the SVC right atrial junction. SVC stent is noted. The left lung is clear. There is no pleural effusion or pneumothorax. IMPRESSION: No significant change in the appearance of the chest is demonstrated. There is stable partial right upper lobe collapse status post SVC stenting for lung cancer. Electronically Signed   By: Richardean Sale M.D.   On: 10/18/2015 16:08    I have personally reviewed and evaluated these images and lab results as part of my medical decision-making.   EKG Interpretation   Date/Time:  Friday September 19 2015 21:06:18 EST Ventricular Rate:  117 PR Interval:  142 QRS Duration: 85 QT Interval:  339 QTC Calculation: 473 R Axis:   101 Text Interpretation:  Sinus tachycardia Probable left atrial enlargement  Right axis deviation Since last tracing of earlier today at 50:93-  concerted to sinus tachycardia Confirmed by Eulis Foster  MD, [place] [phone]) on  10/07/2015 9:40:28 PM         Date: 10/08/2015- MUSE hyperlink is inactive-   Rate: 180  Rhythm: atrial fibrillation with RVR  QRS Axis: normal  PR and QT Intervals: QT prolonged  ST/T Wave abnormalities: nonspecific ST changes  PR and QRS Conduction Disutrbances:prolonged QT  Narrative Interpretation:   Old EKG Reviewed: changes noted- rate faster and AF in new   EKG Interpretation  Date/Time:  Friday September 19 2015 21:06:18 EST Ventricular Rate:  117 PR Interval:  142 QRS Duration: 85 QT Interval:  339 QTC Calculation: 473 R Axis:   101 Text Interpretation:  Sinus tachycardia Probable left atrial enlargement Right axis deviation Since last tracing of earlier today at 45:80- concerted to sinus tachycardia Confirmed by Eulis Foster  MD, Vira Agar ([id number]) on 09/29/2015 9:40:28 PM       MDM   Final diagnoses:  Atrial fibrillation with RVR (HCC)  COPD exacerbation (HCC)  Hyperglycemia  Malignant neoplasm of upper lobe of lung, unspecified laterality (HCC)    Clinical evaluation consistent with A. fib RVR, likely caused by COPD exacerbation. Incidental hyperglycemia is likely secondary to recent use of prednisone, which was given for bronchitis. Patient converted to sinus tachycardia with treatment, IV fluids, plus Cardizem. No apparent pneumonia. Patient has recurrence of lung cancer and is currently on drug holiday, from chemotherapy. Janice Ball will require admission for  monitoring and further treatment.  Plan: Admit  Daleen Bo, MD 09/20/15 1346

## 2015-09-19 NOTE — Progress Notes (Signed)
PHARMACIST - PHYSICIAN COMMUNICATION  CONCERNING: P&T Medication Policy Regarding Oral Bisphosphonates  RECOMMENDATION: Your order for alendronate (Fosamax), ibandronate (Boniva), or risedronate (Actonel) has been discontinued at this time.  If the patient's post-hospital medical condition warrants safe use of this class of drugs, please resume the pre-hospital regimen upon discharge.  DESCRIPTION:  Alendronate (Fosamax), ibandronate (Boniva), and risedronate (Actonel) can cause severe esophageal erosions in patients who are unable to remain upright at least 30 minutes after taking this medication.   Since brief interruptions in therapy are thought to have minimal impact on bone mineral density, the [facility] has established that bisphosphonate orders should be routinely discontinued during hospitalization.   To override this safety policy and permit administration of Boniva, Fosamax, or Actonel in the hospital, prescribers must write "DO NOT HOLD" in the comments section when placing the order for this class of medications.  Thank Dorrene German 09/29/2015 11:21 PM

## 2015-09-19 NOTE — ED Notes (Signed)
Per Eulis Foster allow 250 ml NaCl bolus to infuse prior to cardizem start.

## 2015-09-19 NOTE — ED Notes (Signed)
Clayton Bibles at bedside.

## 2015-09-19 NOTE — ED Notes (Signed)
Urine sample contaminated, unable to send for analysis.

## 2015-09-19 NOTE — H&P (Signed)
[facility] History and Physical  Janice Ball KYH:[id number] DOB: 09-08-1956 DOA: 10/11/2015  Referring physician: ED physician PCP: [name], NP  Specialists:   Chief Complaint: Productive cough, shortness of breath, skin infection  HPI: Janice Ball is a 59 y.o. female with PMH of recurrent non-small cell lung cancer (on on chemotherapy and s/p of radiation, follows with Dr. [name]), SVC syndrome, PE and DVT on Xarelto, COPD, type 2 diabetes mellitus, hypothyroidism and anxiety, and left vocal cord paralysis with weak breathing for which she underwent left medialization thyroplasty on 10/4 by Dr. [name] (ENT), who presents with productive cough, SOB and the skin infection.  Patient reports that she started having worsening shortness of breath since this morning. She has productive cough with brownish colored sputum production. She had some chest pain earlier, which has resolved currently. She has skin infection over right lateral forearm, for which she was given prescription for doxycycline yestoday. She has some pus coming out from the infected area.  No fever, chills, abdominal pain, diarrhea, symptoms of UTI or unilateral weakness. She was found to have atrial fibrillation with RVR on initial EKG in ED, which is converted to sinus tachycardia on repeated EKG after treated with one dose of metoprolol and IV cardiazem.  In ED, patient was found to have WBC 21.6, length 53.10 1.95, temperature 99.7, tachycardia, renal function okay. Chest x-ray has no infiltration. Patient's and admitted to inpatient for further evaluation and treatment.  Where does patient live?   At home   Can patient participate in ADLs?   Little   Review of Systems:   General: no fevers, chills, no changes in body weight, has poor appetite, has fatigue HEENT: no blurry vision, hearing changes or sore throat Pulm: has dyspnea, coughing, wheezing CV: has chest pain, no palpitations Abd: no nausea,  vomiting, abdominal pain, diarrhea, constipation GU: no dysuria, burning on urination, increased urinary frequency, hematuria  Ext: no leg edema Neuro: no unilateral weakness, numbness, or tingling, no vision change or hearing loss Skin: there is small area of skin infection over R lateral forearm. Has multiple skin bruises. MSK: No muscle spasm, no deformity, no limitation of range of movement in spin Heme: No easy bruising.  Travel history: No recent long distant travel.  Allergy:  Allergies  Allergen Reactions  . Carboplatin Rash    Skin test reaction  . Sulfonamide Derivatives Rash  . Codeine Other (See Comments)    hallucinations  . Dilaudid [Hydromorphone Hcl] Nausea And Vomiting and Other (See Comments)    Room started spinning.  Pt has been okay with low doses and nausea meds administered first  . Keflex [Cephalexin] Itching    Rash-arms, chest  . Penicillins Other (See Comments)    Unknown childhood reaction Has patient had a PCN reaction causing immediate rash, facial/tongue/throat swelling, SOB or lightheadedness with hypotension: unknown Has patient had a PCN reaction causing severe rash involving mucus membranes or skin necrosis: unknown Has patient had a PCN reaction that required hospitalization unknown Has patient had a PCN reaction occurring within the last 10 years: no If all of the above answers are "NO", then may proceed with Cephalosporin use.   . Vicodin [Hydrocodone-Acetaminophen] Rash    Past Medical History  Diagnosis Date  . Anxiety   . Hyperlipidemia   . Hypothyroidism   . COPD (chronic obstructive pulmonary disease) ([place])   . Arthritis     thumbs  . 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 Lung cancer ([place]) dx'd 05/2009  . Lung cancer ([place]) dx'd 09/2013    recurrence in LN  . Diabetes mellitus   . Pulmonary embolism ([place])     'I've had a blood  clot in my chest'  . DNR no code (do not resuscitate) 07/31/2015    Past Surgical History  Procedure Laterality Date  . Thyroidectomy, partial    . Tonsillectomy    . Tubal ligation    . Partial hysterectomy    . Tonsillectomy    . Laryngoplasty Left 07/22/2015    Procedure: LEFT VOCAL CORD MEDIALIZATION;  Surgeon: Rozetta Nunnery, MD;  Location: [facility];  Service: ENT;  Laterality: Left;    Social History:  reports that she quit smoking about 10 months ago. Her smoking use included Cigarettes. She has a 22.5 pack-year smoking history. She has never used smokeless tobacco. She reports that she does not drink alcohol or use illicit drugs.  Family History:  Family History  Problem Relation Age of Onset  . Colon cancer Mother   . Cancer Mother     colon  . Diabetes type II Father      Prior to Admission medications   Medication Sig Start Date End Date Taking? Authorizing Provider  alendronate (FOSAMAX) 10 MG tablet Take 10 mg by mouth daily.  03/20/14  Yes Historical Provider, MD  aspirin 81 MG tablet Take 81 mg by mouth daily.   Yes Historical Provider, MD  atorvastatin (LIPITOR) 20 MG tablet Take 20 mg by mouth at bedtime.   Yes Historical Provider, MD  Bioflavonoid Products (ESTER C PO) Take 1 tablet by mouth 2 (two) times daily. Ester-C Calcium Ascorbate (Vitamin C 500 mg and Calcium 55 mg)   Yes Historical Provider, MD  budesonide-formoterol (SYMBICORT) 160-4.5 MCG/ACT inhaler Inhale 2 puffs into the lungs 2 (two) times daily.   Yes Historical Provider, MD  cholecalciferol (VITAMIN D) 1000 UNITS tablet Take 1,000 Units by mouth daily.    Yes Historical Provider, MD  [employer] NATURAL LUTEIN EYE HEALTH PO Take 1 tablet by mouth at bedtime.   Yes Historical Provider, MD  Cyanocobalamin 1500 MCG TBDP Take 1,500 mcg by mouth daily. Vitamin B12   Yes Historical Provider, MD  doxycycline (VIBRAMYCIN) 100 MG capsule Take 100 mg by mouth 2 (two) times daily.  09/16/15  Yes  Historical Provider, MD  glimepiride (AMARYL) 2 MG tablet Take 2 tablets (4 mg total) by mouth daily with breakfast. Take higher dose (51mby mouth) while on prednisone. Once off prednisone go back to home regimen of 2 mg by mouth daily. 08/10/15  Yes PDomenic Polite MD  Guaifenesin ([facility] Health[facility]UM STRENGTH) 1200 MG TB12 Take 1,200 mg by mouth 2 (two) times daily.   Yes Historical Provider, MD  levothyroxine (SYNTHROID, LEVOTHROID) 137 MCG tablet Take 137 mcg by mouth daily.  01/15/15  Yes Historical Provider, MD  LORazepam (ATIVAN) 1 MG tablet Take 1 mg by mouth 3 (three) times daily as needed for anxiety.    Yes Historical Provider, MD  Magnesium Oxide 500 MG TABS Take 500 mg by mouth daily.   Yes Historical Provider, MD  metFORMIN (GLUCOPHAGE) 1000 MG tablet Take 1,000 mg by mouth 2 (two) times daily with a meal.   Yes Historical Provider, MD  Methylsulfonylmethane (MSM) 1000 MG CAPS Take 1,000 mg by mouth daily.   Yes Historical Provider, MD  Multiple  Vitamin (MULTIVITAMIN WITH MINERALS) TABS tablet Take 1 tablet by mouth daily. Spectravite   Yes Historical Provider, MD  omega-3 acid ethyl esters (LOVAZA) 1 G capsule Take 1 g by mouth 2 (two) times daily.    Yes Historical Provider, MD  oxyCODONE (OXY IR/ROXICODONE) 5 MG immediate release tablet Take 1 tablet (5 mg total) by mouth every 4 (four) hours as needed for severe pain. 09/10/15  Yes Curt Bears, MD  predniSONE (DELTASONE) 10 MG tablet 6 tab x3 days 5tab x3 days 4tabs x3 days 3tab x3 days 2tabs x3 days 1 tab x3 days 09/03/15  Yes Praveen Mannam, MD  rivaroxaban (XARELTO) 20 MG TABS tablet Take 1 tablet (20 mg total) by mouth daily with supper. 07/07/15  Yes Curt Bears, MD  sertraline (ZOLOFT) 100 MG tablet Take 150 mg by mouth at bedtime.    Yes Historical Provider, MD  spironolactone (ALDACTONE) 25 MG tablet Take 25 mg by mouth daily.  05/27/15  Yes Historical Provider, MD  tiotropium (SPIRIVA) 18 MCG inhalation capsule Place 1  capsule (18 mcg total) into inhaler and inhale daily. 04/26/15  Yes Velvet Bathe, MD  acetaminophen (TYLENOL) 325 MG tablet Take 650 mg by mouth every 6 (six) hours as needed for mild pain or headache.     Historical Provider, MD  benzonatate (TESSALON) 200 MG capsule Take 1 capsule (200 mg total) by mouth 3 (three) times daily as needed for cough. Patient not taking: Reported on 09/18/2015 08/21/15   Janece Canterbury, MD  benzonatate (TESSALON) 200 MG capsule Take 1 capsule (200 mg total) by mouth 3 (three) times daily as needed for cough. 09/03/15   Marshell Garfinkel, MD  chlorpheniramine-HYDROcodone (TUSSIONEX PENNKINETIC ER) 10-8 MG/5ML SUER Take 5 mLs by mouth 2 (two) times daily as needed for cough. 09/03/15   Praveen Mannam, MD  levofloxacin (LEVAQUIN) 500 MG tablet Take 1 tablet (500 mg total) by mouth daily. Patient not taking: Reported on 10/01/2015 09/03/15   Praveen Mannam, MD  PROAIR HFA 108 (90 BASE) MCG/ACT inhaler INHALE 2 PUFFS INTO THE LUNGS EVERY 6 HOURS AS NEEDED FOR WHEEZING OR SHORTNESS OF BREATH. 06/17/15   Brand Males, MD  prochlorperazine (COMPAZINE) 10 MG tablet Take 1 tablet (10 mg total) by mouth every 6 (six) hours as needed for nausea or vomiting. 06/25/15   Curt Bears, MD    Physical Exam: Filed Vitals:   09/24/2015 2100 09/27/2015 2137 10/07/2015 2145 09/26/2015 2230  BP: 126/71  126/79 110/67  Pulse: 117  120 114  Temp:  98.2 F (36.8 C)    TempSrc:  Oral    Resp: '21  18 21  '$ Height:      Weight:      SpO2: 98%  97% 98%   General: Not in acute distress HEENT:       Eyes: PERRL, EOMI, no scleral icterus.       ENT: No discharge from the ears and nose, no pharynx injection, no tonsillar enlargement.        Neck: No JVD, no bruit, no mass felt. Heme: No neck lymph node enlargement. Cardiac: S1/S2, RRR, tachycardia, No murmurs, No gallops or rubs. Pulm: diffussed wheezing and rhonchi bilaterally, No rales or rubs. Abd: Soft, nondistended, nontender, no rebound pain,  no organomegaly, BS present. Ext: No pitting leg edema bilaterally. 2+DP/PT pulse bilaterally. Musculoskeletal: No joint deformities, No joint redness or warmth, no limitation of ROM in spin. Skin: there is small area of skin infection over R lateral forearm, with  pus coming out on compression. Has multiple skin bruises. Neuro: Alert, oriented X3, cranial nerves II-XII grossly intact, muscle strength 5/5 in all extremities, sensation to light touch intact. Psych: Patient is not psychotic, no suicidal or hemocidal ideation.  Labs on Admission:  Basic Metabolic Panel:  Recent Labs Lab 09/18/15 1029 10/11/2015 1535  NA 136 132*  K 3.7 3.7  CL  --  94*  CO2 24 23  GLUCOSE 296* 553*  BUN 13.6 18  CREATININE 0.8 0.83  CALCIUM 8.1* 8.0*   Liver Function Tests:  Recent Labs Lab 09/18/15 1029 10/10/2015 1535  AST 17 23  ALT 16 22  ALKPHOS 120 114  BILITOT 0.63 0.8  PROT 6.6 6.9  ALBUMIN 3.0* 3.3*   No results for input(s): LIPASE, AMYLASE in the last 168 hours. No results for input(s): AMMONIA in the last 168 hours. CBC:  Recent Labs Lab 09/18/15 1029 10/01/2015 1535  WBC 16.5* 21.6*  NEUTROABS 13.7* 18.5*  HGB 11.8 12.6  HCT 38.1 40.5  MCV 78.1* 80.5  PLT 258 249   Cardiac Enzymes: No results for input(s): CKTOTAL, CKMB, CKMBINDEX, TROPONINI in the last 168 hours.  BNP (last 3 results)  Recent Labs  11/30/14 1450 04/20/15 0340 08/08/15 0543  BNP 29.9 32.5 37.2    ProBNP (last 3 results) No results for input(s): PROBNP in the last 8760 hours.  CBG:  Recent Labs Lab 10/17/2015 2156  GLUCAP 391*    Radiological Exams on Admission: Dg Chest Port 1 View  10/09/2015  CLINICAL DATA:  Shortness of breath with congestion today. History of COPD and lung cancer. Initial encounter. EXAM: PORTABLE CHEST 1 VIEW COMPARISON:  Radiographs 08/19/2015.  CT 08/16/2015. FINDINGS: 1549 hours. Mild patient rotation to the right. There is stable volume loss in the right  hemithorax with partial right upper lobe collapse. There is stable right paratracheal, right hilar and subcarinal fullness. Right IJ Port-A-Cath tip is unchanged near the SVC right atrial junction. SVC stent is noted. The left lung is clear. There is no pleural effusion or pneumothorax. IMPRESSION: No significant change in the appearance of the chest is demonstrated. There is stable partial right upper lobe collapse status post SVC stenting for lung cancer. Electronically Signed   By: Richardean Sale M.D.   On: 09/22/2015 16:08    EKG: Independently reviewed. Afib with RVR   Assessment/Plan Principal Problem:   COPD with acute exacerbation (HCC) Active Problems:   Primary cancer of right upper lobe of lung (HCC)   Hypothyroidism   DM type 2 (diabetes mellitus, type 2) ([place])   HLD (hyperlipidemia)   Anxiety state   Sepsis ([place])   DVT (deep venous thrombosis) (HCC)   Unilateral vocal cord paralysis   Long term current use of anticoagulant therapy   Acute on chronic respiratory failure (HCC)   Depression   PE (physical exam), annual   Skin infection   Atrial fibrillation with RVR ([place])   COPD with acute exacerbation ([place]): Patient's productive cough, wheezing and shortness of breath are consistent with COPD exacerbation. Patient is on Xarelto, less likely to have new PE. No infiltration on chest x-ray.  -will admit patient to telemetry bed  -Nebulizers: scheduled Atroven and prn Xopenex -Solu-Medrol 60 mg IV tid -IV vancomycin and aztreonam which is also for sepsis  -Cough control -Follow up blood culture x2, sputum culture, Flu pcr  Skin infection over right lateral forearm: Patient has active pus drainage over the infected area. -get wound care consult -IV  vancomycin and aztreonam  Sepsis: Patient is septic with elevated lactate, tachycardia and leukocytosis on admission. She is hemodynamically stable. Most likely due to skin infection and COPD exacerbation. -will get  Procalcitonin and trend lactic acid levels per sepsis protocol. -IVF: 2.5L of NS bolus in ED, followed by 100 cc/h -On IV vancomycin and aztreonam   DM-II: Last A1c 8.2 on 08/16/15, poorly controled. Patient is taking metformin and Amaryl at home. Her blood pressure is 553 on admission. -Started Lantus 5 units daily -SSI -IV fluids as above  Hx of PE and Extensive DVT (deep venous thrombosis) (HCC) of left upper extremity, status post thrombectomy -on Xarelto  Recurrent non-small cell lung cancer, squamous cell carcinoma diagnosed in 2010: Multiple systemic chemotherapy and radiation.  -Follows with Dr. [name].  Chronic diastolic (congestive) heart failure ([place]): 2-D echo 08/17/15 showed EF of 65-70 %. Patient does not have leg edema. CHF is compensated.  -Hold spironolactone due to sepsis and need IVF -ASA - check BNP  Hypothyroidism: Last TSH was 1.5 on 10/29.  -Continue home Synthroid  HLD: Last LDL was 95 on 04/21/15 -Continue home medications: Lipitor  Depression and anxiety: Stable, no suicidal or homicidal ideations. -Continue home medications: Ativan and Zoloft  New onset Atrial fibrillation with RVR ([place]): it is most likely triggered by hypoxia. It has already converted to sinus tachycardia. CHA2DS2-VASc Score is 3, needs oral anticoagulation. Patient is already on Xarelto for PE at home.  -IV Cardizem drip -Continue Xarelto -trop x 3   DVT ppx: Xarelto Code Status: DNR Family Communication: None at bed side.   Disposition Plan: Admit to inpatient   Date of Service 09/21/2015    Ivor Costa [facility] Pager [phone]  If 7PM-7AM, please contact night-coverage [email or web address] Password [facility] 10/14/2015, 11:09 PM

## 2015-09-19 NOTE — Progress Notes (Signed)
ANTIBIOTIC CONSULT NOTE - INITIAL  Pharmacy Consult for Azactam/Vancomycin Indication: Sepsis/skin infection/COPD exacerbation  Allergies  Allergen Reactions  . Carboplatin Rash    Skin test reaction  . Sulfonamide Derivatives Rash  . Codeine Other (See Comments)    hallucinations  . Dilaudid [Hydromorphone Hcl] Nausea And Vomiting and Other (See Comments)    Room started spinning.  Pt has been okay with low doses and nausea meds administered first  . Keflex [Cephalexin] Itching    Rash-arms, chest  . Penicillins Other (See Comments)    Unknown childhood reaction Has patient had a PCN reaction causing immediate rash, facial/tongue/throat swelling, SOB or lightheadedness with hypotension: unknown Has patient had a PCN reaction causing severe rash involving mucus membranes or skin necrosis: unknown Has patient had a PCN reaction that required hospitalization unknown Has patient had a PCN reaction occurring within the last 10 years: no If all of the above answers are "NO", then may proceed with Cephalosporin use.   . Vicodin [Hydrocodone-Acetaminophen] Rash    Patient Measurements: Height: '5\' 7"'$  (170.2 cm) Weight: 136 lb (61.689 kg) IBW/kg (Calculated) : 61.6   Vital Signs: Temp: 98.2 F (36.8 C) (12/02 2137) Temp Source: Oral (12/02 2137) BP: 110/67 mmHg (12/02 2230) Pulse Rate: 114 (12/02 2230) Intake/Output from previous day:   Intake/Output from this shift:    Labs:  Recent Labs  09/18/15 1029 09/18/15 1029 09/27/2015 1535  WBC 16.5*  --  21.6*  HGB 11.8  --  12.6  PLT 258  --  249  CREATININE  --  0.8 0.83   Estimated Creatinine Clearance: 69.2 mL/min (by C-G formula based on Cr of 0.83). No results for input(s): VANCOTROUGH, VANCOPEAK, VANCORANDOM, GENTTROUGH, GENTPEAK, GENTRANDOM, TOBRATROUGH, TOBRAPEAK, TOBRARND, AMIKACINPEAK, AMIKACINTROU, AMIKACIN in the last 72 hours.   Microbiology: No results found for this or any previous visit (from the past 720  hour(s)).  Medical History: Past Medical History  Diagnosis Date  . Anxiety   . Hyperlipidemia   . Hypothyroidism   . COPD (chronic obstructive pulmonary disease) ([place])   . Arthritis     thumbs  . 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 Lung cancer ([place]) dx'd 05/2009  . Lung cancer ([place]) dx'd 09/2013    recurrence in LN  . Diabetes mellitus   . Pulmonary embolism ([place])     'I've had a blood clot in my chest'  . DNR no code (do not resuscitate) 07/31/2015    Medications:   (Not in a hospital admission) Scheduled:  . [START ON 09/20/2015] alendronate  10 mg Oral Daily  . [START ON 09/20/2015] aspirin  81 mg Oral Daily  . atorvastatin  20 mg Oral QHS  . budesonide-formoterol  2 puff Inhalation BID  . [START ON 09/20/2015] cholecalciferol  1,000 Units Oral Daily  . [START ON 09/20/2015] Cyanocobalamin  1,500 mcg Oral Daily  . Guaifenesin  1,200 mg Oral BID  . [START ON 09/20/2015] insulin aspart  0-15 Units Subcutaneous TID WC  . insulin glargine  5 Units Subcutaneous Daily  . [START ON 09/20/2015] ipratropium  0.5 mg Nebulization Q4H  . [START ON 09/20/2015] levalbuterol  1.25 mg Nebulization 4 times per day  . [START ON 09/20/2015] levothyroxine  137 mcg Oral Daily  . [START ON 09/20/2015] Magnesium Oxide  500 mg Oral Daily  . methylPREDNISolone (SOLU-MEDROL) injection  60 mg Intravenous 3 times per  day  . metoprolol  5 mg Intravenous Once  . [START ON 09/20/2015] MSM  1,000 mg Oral Daily  . [START ON 09/20/2015] multivitamin with minerals  1 tablet Oral Daily  . omega-3 acid ethyl esters  1 g Oral BID  . [START ON 09/20/2015] rivaroxaban  20 mg Oral Q supper  . sertraline  150 mg Oral QHS  . sodium chloride  3 mL Intravenous Q12H  . [START ON 09/20/2015] tiotropium  18 mcg Inhalation Daily  . vancomycin  750 mg Intravenous Q12H   Infusions:  . sodium chloride    . [START ON  09/20/2015] aztreonam    . diltiazem (CARDIZEM) infusion Stopped (09/23/2015 2126)  . sodium chloride     Assessment: 47 yoF with recurrent non-small cell lung Ca admitted with SOB/congestion and skin infection on right forearm.  Azactam/Vancomycin per Rx for sepsis/skin infection/COPD exacerbation.   Goal of Therapy:  Vancomycin trough level 15-20 mcg/ml  Plan:   Azactam 2Gm IV q8h  Vancomycin '750mg'$  IV q12h  F/u SCr/cultures/levels  Lawana Pai R 10/10/2015,11:02 PM

## 2015-09-19 NOTE — ED Notes (Signed)
Pt requesting pain medication.  

## 2015-09-19 NOTE — ED Notes (Signed)
DG at bedside.

## 2015-09-19 NOTE — ED Notes (Signed)
edp wentz made aware of lactic acid results

## 2015-09-19 NOTE — ED Notes (Signed)
Ngygen aware of pt complaint and current status.

## 2015-09-19 NOTE — ED Notes (Signed)
Attempt to collect urine; contaminated with feces.

## 2015-09-19 NOTE — ED Notes (Signed)
Bed: DY70 Expected date:  Expected time:  Means of arrival:  Comments: Ems- sob, congestion

## 2015-09-19 NOTE — ED Notes (Addendum)
Informed Dr. Eulis Foster about pt's continued tachycardia despite previous RN's titration of cardizem.  Introduced self to pt and family.  Delayed explained. Pt reports being comfortable for the time being and rates pain 2 out of 10. Dr. Eulis Foster now in pt's room.

## 2015-09-19 NOTE — ED Notes (Signed)
Holly Stettler attempt to draw I STAT LACTIC as ordered unsuccessful. Raeanne Barry lead tech reports phlebotomy/mini lab person made aware and will attempt blood draw.

## 2015-09-19 NOTE — ED Notes (Signed)
Dr. Eulis Foster made of pt's blood sugar.

## 2015-09-19 NOTE — ED Notes (Signed)
PT had bowel movement on bedpan

## 2015-09-20 ENCOUNTER — Ambulatory Visit: Payer: Medicare Other

## 2015-09-20 DIAGNOSIS — C3410 Malignant neoplasm of upper lobe, unspecified bronchus or lung: Secondary | ICD-10-CM | POA: Insufficient documentation

## 2015-09-20 DIAGNOSIS — I82629 Acute embolism and thrombosis of deep veins of unspecified upper extremity: Secondary | ICD-10-CM

## 2015-09-20 LAB — GLUCOSE, CAPILLARY
GLUCOSE-CAPILLARY: 384 mg/dL — AB (ref 65–99)
GLUCOSE-CAPILLARY: 459 mg/dL — AB (ref 65–99)
GLUCOSE-CAPILLARY: 373 mg/dL — AB (ref 65–99)
Glucose-Capillary: 354 mg/dL — ABNORMAL HIGH (ref 65–99)
Glucose-Capillary: 287 mg/dL — ABNORMAL HIGH (ref 65–99)

## 2015-09-20 LAB — CBC
HCT: 33.4 % — ABNORMAL LOW (ref 36.0–46.0)
HEMOGLOBIN: 10.3 g/dL — AB (ref 12.0–15.0)
MCH: 25.2 pg — ABNORMAL LOW (ref 26.0–34.0)
MCHC: 30.8 g/dL (ref 30.0–36.0)
MCV: 81.9 fL (ref 78.0–100.0)
PLATELETS: 147 10*3/uL — AB (ref 150–400)
RBC: 4.08 MIL/uL (ref 3.87–5.11)
RDW: 17.1 % — ABNORMAL HIGH (ref 11.5–15.5)
WBC: 13.3 10*3/uL — AB (ref 4.0–10.5)

## 2015-09-20 LAB — INFLUENZA PANEL BY PCR (TYPE A & B)
H1N1 flu by pcr: NOT DETECTED
INFLAPCR: NEGATIVE
Influenza B By PCR: NEGATIVE

## 2015-09-20 LAB — BASIC METABOLIC PANEL
Anion gap: 6 (ref 5–15)
BUN: 9 mg/dL (ref 6–20)
CALCIUM: 6.8 mg/dL — AB (ref 8.9–10.3)
CO2: 26 mmol/L (ref 22–32)
Chloride: 101 mmol/L (ref 101–111)
Creatinine, Ser: 0.5 mg/dL (ref 0.44–1.00)
GFR calc Af Amer: >60 mL/min (ref >60)
GLUCOSE: 387 mg/dL — AB (ref 65–99)
Potassium: 3.8 mmol/L (ref 3.5–5.1)
Sodium: 133 mmol/L — ABNORMAL LOW (ref 135–145)

## 2015-09-20 LAB — TROPONIN I
Troponin I: 0.07 ng/mL — ABNORMAL HIGH (ref <0.031)
Troponin I: 0.04 ng/mL — ABNORMAL HIGH (ref <0.031)
Troponin I: 0.04 ng/mL — ABNORMAL HIGH (ref <0.031)

## 2015-09-20 LAB — PROTIME-INR
INR: 1.33 (ref 0.00–1.49)
PROTHROMBIN TIME: 16.6 s — AB (ref 11.6–15.2)

## 2015-09-20 LAB — PROCALCITONIN: Procalcitonin: 0.28 ng/mL

## 2015-09-20 LAB — LACTIC ACID, PLASMA: LACTIC ACID, VENOUS: 1.1 mmol/L (ref 0.5–2.0)

## 2015-09-20 LAB — EXPECTORATED SPUTUM ASSESSMENT W REFEX TO RESP CULTURE

## 2015-09-20 LAB — MAGNESIUM: MAGNESIUM: 1.5 mg/dL — AB (ref 1.7–2.4)

## 2015-09-20 LAB — APTT: APTT: 26 s (ref 24–37)

## 2015-09-20 LAB — PHOSPHORUS: PHOSPHORUS: 4 mg/dL (ref 2.5–4.6)

## 2015-09-20 LAB — EXPECTORATED SPUTUM ASSESSMENT W GRAM STAIN, RFLX TO RESP C

## 2015-09-20 LAB — MRSA PCR SCREENING: MRSA BY PCR: NEGATIVE

## 2015-09-20 LAB — BRAIN NATRIURETIC PEPTIDE: B NATRIURETIC PEPTIDE 5: 220 pg/mL — AB (ref 0.0–100.0)

## 2015-09-20 MED ORDER — IPRATROPIUM BROMIDE 0.02 % IN SOLN
0.5000 mg | RESPIRATORY_TRACT | Status: DC | PRN
  Filled 2015-09-20: qty 2.5

## 2015-09-20 MED ORDER — LEVALBUTEROL HCL 1.25 MG/0.5ML IN NEBU
1.2500 mg | INHALATION_SOLUTION | Freq: Four times a day (QID) | RESPIRATORY_TRACT | Status: DC
  Administered 2015-09-20 – 2015-09-22 (×8): 1.25 mg via RESPIRATORY_TRACT
  Filled 2015-09-20 (×9): qty 0.5

## 2015-09-20 MED ORDER — METOPROLOL TARTRATE 1 MG/ML IV SOLN
5.0000 mg | Freq: Once | INTRAVENOUS | Status: AC
  Administered 2015-09-20: 5 mg via INTRAVENOUS
  Filled 2015-09-20: qty 5

## 2015-09-20 MED ORDER — INSULIN ASPART 100 UNIT/ML ~~LOC~~ SOLN: 6.0000 [IU] | Freq: Three times a day (TID) | SUBCUTANEOUS | Status: DC

## 2015-09-20 MED ORDER — INSULIN GLARGINE 100 UNIT/ML ~~LOC~~ SOLN
10.0000 [IU] | Freq: Every day | SUBCUTANEOUS | Status: DC
  Administered 2015-09-20: 10 [IU] via SUBCUTANEOUS
  Filled 2015-09-20: qty 0.1

## 2015-09-20 MED ORDER — INSULIN ASPART 100 UNIT/ML ~~LOC~~ SOLN
0.0000 [IU] | Freq: Every day | SUBCUTANEOUS | Status: DC
  Administered 2015-09-20: 3 [IU] via SUBCUTANEOUS
  Administered 2015-09-24: 5 [IU] via SUBCUTANEOUS
  Administered 2015-09-25: 3 [IU] via SUBCUTANEOUS
  Administered 2015-09-26: 2 [IU] via SUBCUTANEOUS
  Administered 2015-09-27: 4 [IU] via SUBCUTANEOUS
  Administered 2015-09-28: 3 [IU] via SUBCUTANEOUS
  Administered 2015-09-29: 2 [IU] via SUBCUTANEOUS
  Administered 2015-10-04: 5 [IU] via SUBCUTANEOUS

## 2015-09-20 MED ORDER — INSULIN GLARGINE 100 UNIT/ML ~~LOC~~ SOLN
20.0000 [IU] | Freq: Every day | SUBCUTANEOUS | Status: DC
  Filled 2015-09-20: qty 0.2

## 2015-09-20 MED ORDER — INSULIN GLARGINE 100 UNIT/ML ~~LOC~~ SOLN
5.0000 [IU] | Freq: Once | SUBCUTANEOUS | Status: AC
  Administered 2015-09-20: 5 [IU] via SUBCUTANEOUS
  Filled 2015-09-20: qty 0.05

## 2015-09-20 MED ORDER — DOCUSATE SODIUM 100 MG PO CAPS
100.0000 mg | ORAL_CAPSULE | Freq: Two times a day (BID) | ORAL | Status: DC
  Administered 2015-09-20 – 2015-10-04 (×23): 100 mg via ORAL
  Filled 2015-09-20 (×37): qty 1

## 2015-09-20 MED ORDER — MAGNESIUM SULFATE 4 GM/100ML IV SOLN
4.0000 g | Freq: Once | INTRAVENOUS | Status: AC
  Administered 2015-09-20: 4 g via INTRAVENOUS
  Filled 2015-09-20: qty 100

## 2015-09-20 MED ORDER — CALCIUM CITRATE 950 (200 CA) MG PO TABS
200.0000 mg | ORAL_TABLET | Freq: Four times a day (QID) | ORAL | Status: DC
  Administered 2015-09-20 – 2015-10-04 (×55): 200 mg via ORAL
  Filled 2015-09-20 (×62): qty 1

## 2015-09-20 MED ORDER — IPRATROPIUM BROMIDE 0.02 % IN SOLN
0.5000 mg | Freq: Four times a day (QID) | RESPIRATORY_TRACT | Status: DC
  Administered 2015-09-20 – 2015-09-22 (×9): 0.5 mg via RESPIRATORY_TRACT
  Filled 2015-09-20 (×9): qty 2.5

## 2015-09-20 MED ORDER — LEVALBUTEROL HCL 1.25 MG/0.5ML IN NEBU
1.2500 mg | INHALATION_SOLUTION | RESPIRATORY_TRACT | Status: DC | PRN
  Administered 2015-09-23 – 2015-09-24 (×2): 1.25 mg via RESPIRATORY_TRACT
  Filled 2015-09-20 (×4): qty 0.5

## 2015-09-20 MED ORDER — INSULIN ASPART 100 UNIT/ML ~~LOC~~ SOLN
6.0000 [IU] | Freq: Three times a day (TID) | SUBCUTANEOUS | Status: DC
  Administered 2015-09-20 – 2015-09-21 (×4): 6 [IU] via SUBCUTANEOUS

## 2015-09-20 MED ORDER — SODIUM CHLORIDE 0.9 % IV SOLN
INTRAVENOUS | Status: DC
  Administered 2015-09-20: 10:00:00 via INTRAVENOUS

## 2015-09-20 MED ORDER — SODIUM CHLORIDE 0.9 % IV SOLN: INTRAVENOUS | Status: DC

## 2015-09-20 MED ORDER — INSULIN ASPART 100 UNIT/ML ~~LOC~~ SOLN
0.0000 [IU] | Freq: Three times a day (TID) | SUBCUTANEOUS | Status: DC
  Administered 2015-09-21: 11 [IU] via SUBCUTANEOUS
  Administered 2015-09-21: 20 [IU] via SUBCUTANEOUS
  Administered 2015-09-21: 7 [IU] via SUBCUTANEOUS
  Administered 2015-09-22 (×2): 11 [IU] via SUBCUTANEOUS
  Administered 2015-09-22: 20 [IU] via SUBCUTANEOUS
  Administered 2015-09-23 (×3): 15 [IU] via SUBCUTANEOUS
  Administered 2015-09-24 (×2): 7 [IU] via SUBCUTANEOUS
  Administered 2015-09-25: 20 [IU] via SUBCUTANEOUS
  Administered 2015-09-25: 11 [IU] via SUBCUTANEOUS
  Administered 2015-09-25: 15 [IU] via SUBCUTANEOUS
  Administered 2015-09-26: 4 [IU] via SUBCUTANEOUS
  Administered 2015-09-26: 11 [IU] via SUBCUTANEOUS
  Administered 2015-09-26: 15 [IU] via SUBCUTANEOUS
  Administered 2015-09-27: 3 [IU] via SUBCUTANEOUS
  Administered 2015-09-27: 4 [IU] via SUBCUTANEOUS
  Administered 2015-09-28: 7 [IU] via SUBCUTANEOUS
  Administered 2015-09-28 (×2): 20 [IU] via SUBCUTANEOUS
  Administered 2015-09-29: 7 [IU] via SUBCUTANEOUS
  Administered 2015-09-29 – 2015-09-30 (×3): 20 [IU] via SUBCUTANEOUS
  Administered 2015-09-30: 7 [IU] via SUBCUTANEOUS
  Administered 2015-10-01: 4 [IU] via SUBCUTANEOUS
  Administered 2015-10-01: 15 [IU] via SUBCUTANEOUS
  Administered 2015-10-01: 11 [IU] via SUBCUTANEOUS
  Administered 2015-10-02: 3 [IU] via SUBCUTANEOUS
  Administered 2015-10-02: 11 [IU] via SUBCUTANEOUS
  Administered 2015-10-02: 15 [IU] via SUBCUTANEOUS
  Administered 2015-10-03: 7 [IU] via SUBCUTANEOUS
  Administered 2015-10-03: 11 [IU] via SUBCUTANEOUS
  Administered 2015-10-03 – 2015-10-04 (×2): 4 [IU] via SUBCUTANEOUS

## 2015-09-20 MED ORDER — LEVALBUTEROL HCL 1.25 MG/0.5ML IN NEBU
1.2500 mg | INHALATION_SOLUTION | Freq: Four times a day (QID) | RESPIRATORY_TRACT | Status: DC
  Administered 2015-09-20: 1.25 mg via RESPIRATORY_TRACT
  Filled 2015-09-20 (×2): qty 0.5

## 2015-09-20 MED ORDER — INSULIN ASPART 100 UNIT/ML ~~LOC~~ SOLN
0.0000 [IU] | Freq: Three times a day (TID) | SUBCUTANEOUS | Status: DC
  Administered 2015-09-20: 20 [IU] via SUBCUTANEOUS

## 2015-09-20 MED ORDER — INSULIN ASPART 100 UNIT/ML ~~LOC~~ SOLN
26.0000 [IU] | Freq: Once | SUBCUTANEOUS | Status: AC
  Administered 2015-09-20: 26 [IU] via SUBCUTANEOUS

## 2015-09-20 MED ORDER — LEVALBUTEROL HCL 1.25 MG/0.5ML IN NEBU: 1.2500 mg | INHALATION_SOLUTION | Freq: Four times a day (QID) | RESPIRATORY_TRACT | Status: DC

## 2015-09-20 NOTE — Evaluation (Signed)
Physical Therapy Evaluation Patient Details Name: Janice Ball MRN: [id number] DOB: April 04, 1956 Today's Date: 09/20/2015   History of Present Illness  Pt admitted with COPD exacerbation and hx of lung CA, PE and DM  Clinical Impression  Pt admitted as above and presenting with functional mobility limitations 2* generalized weakness, SOB with exertion, and ambulatory in stability.  Pt should progress to dc home with family assist and HHPT follow up.    Follow Up Recommendations Home health PT    Equipment Recommendations  None recommended by PT    Recommendations for Other Services OT consult     Precautions / Restrictions Precautions Precautions: Fall Restrictions Weight Bearing Restrictions: No      Mobility  Bed Mobility Overal bed mobility: Needs Assistance Bed Mobility: Supine to Sit;Sit to Supine     Supine to sit: Min guard Sit to supine: Min guard   General bed mobility comments: increased time  Transfers Overall transfer level: Needs assistance Equipment used: Rolling walker (2 wheeled) Transfers: Sit to/from [facility] Sit to Stand: Min assist Stand pivot transfers: Min assist;Mod assist       General transfer comment: bed to [facility]  Cues for transition position and use of UEs to self assist`  Ambulation/Gait Ambulation/Gait assistance: Min assist;+2 physical assistance;+2 safety/equipment Ambulation Distance (Feet): 200 Feet Assistive device: Rolling walker (2 wheeled) Gait Pattern/deviations: Step-through pattern;Decreased step length - right;Decreased step length - left;Shuffle Gait velocity: decr   General Gait Details: cues for posture, pacing and position from RW.  MUltiple short rests in standing to complete task  Stairs            Wheelchair Mobility    Modified Rankin (Stroke Patients Only)       Balance Overall balance assessment: Needs assistance Sitting-balance support: Feet supported Sitting balance-Leahy  Scale: Fair     Standing balance support: Bilateral upper extremity supported Standing balance-Leahy Scale: Poor                               Pertinent Vitals/Pain Pain Assessment: No/denies pain    Home Living Family/patient expects to be discharged to:: Private residence Living Arrangements: Children Available Help at Discharge: Family Type of Home: House Home Access: Ramped entrance     Home Layout: One level Home Equipment: Walker - standard Additional Comments: Pt states she will discuss RW with dtr.      Prior Function Level of Independence: Independent with assistive device(s)         Comments: Family assistance with house work - ambulated with cane     Hand Dominance   Dominant Hand: Right    Extremity/Trunk Assessment   Upper Extremity Assessment: Generalized weakness           Lower Extremity Assessment: Generalized weakness         Communication   Communication: No difficulties  Cognition Arousal/Alertness: Awake/alert Behavior During Therapy: WFL for tasks assessed/performed Overall Cognitive Status: Within Functional Limits for tasks assessed                      General Comments      Exercises        Assessment/Plan    PT Assessment Patient needs continued PT services  PT Diagnosis Difficulty walking   PT Problem List Decreased strength;Decreased activity tolerance;Decreased balance;Decreased mobility;Decreased knowledge of use of DME  PT Treatment Interventions DME instruction;Gait training;Functional mobility training;Therapeutic activities;Therapeutic  exercise;Balance training;Patient/family education   PT Goals (Current goals can be found in the Care Plan section) Acute Rehab PT Goals Patient Stated Goal: Home with dtr PT Goal Formulation: With patient Time For Goal Achievement: 10-11-2015 Potential to Achieve Goals: Good    Frequency Min 3X/week   Barriers to discharge        Co-evaluation                End of Session Equipment Utilized During Treatment: Gait belt;Oxygen;Other (comment) (Mask 2* isolation) Activity Tolerance: Patient tolerated treatment well Patient left: in bed;with call bell/phone within reach Nurse Communication: Mobility status         Time: 1200-1238 PT Time Calculation (min) (ACUTE ONLY): 38 min   Charges:   PT Evaluation $Initial PT Evaluation Tier I: 1 Procedure PT Treatments $Gait Training: 23-37 mins   PT G Codes:        Niaomi Cartaya 09-27-15, 1:52 PM

## 2015-09-20 NOTE — Progress Notes (Signed)
CPT Q4 (no place in EPIC flowseeht to chart until 1200. RT placed PT on CPT via bed- tolerated well. PC- bloody sputum- RN aware. Total CPT 20 mins , full range.

## 2015-09-20 NOTE — Progress Notes (Signed)
[facility] PROGRESS NOTE  FONDA ROCHON KZS:[id number] DOB: 13-Aug-1956 DOA: 10/02/2015 PCP: [name], NP  Assessment/Plan: #1 acute COPD exacerbation  Questionable etiology. Chest x-ray negative for any acute infiltrate. Patient sounds very congested. Patient with diffuse wheezing and some coarse breath sounds. Change Spiriva to Atrovent. Continue Xopenex scheduled nebulizers. Continue Symbicort, Mucinex, IV steroids. Add singular. Advair Flonase. WIll place on chest PT. Follow.  #2 sepsis Patient on admission was noted to have a leukocytosis with a white count of 21.6, tachycardic, with a elevated lactate level. May be secondary to skin infection and COPD exacerbation. Blood cultures are pending. Chest x-ray negative for any acute infiltrate. Urine cultures are pending. Repeat lactate level improved at 1.1. WBC trending down currently at 13.3 from 21.6 on admission. Continue IV fluids, empiric IV antibiotics, treatment for COPD exacerbation. Follow.  #3 leukocytosis Patient has been pancultured. Continue empiric IV antibiotics.  #4 skin infection over right lateral forearm Bandage around forearm. Continue empiric IV vancomycin and IV aztreonam. Wound care consult.  #5 diabetes mellitus type 2 Hemoglobin A1c was 8.2 on 08/16/2015. Patient CBG on admission was 553. CBG this morning was 384. Increase Lantus to 10 units daily. Change sliding scale insulin to resistant scale. Follow.  #6 hypocalcemia Corrected calcium is 7.4. Will check an ionized calcium, PTH, magnesium, phosphorus, vitamin D levels. Place on calcium citrate 500 mg 4 times a day with water and vitamin D 1000 international units by mouth daily with water. Follow.  #7 sinus tachycardia Likely secondary to problem #1 and 2. Will give a dose of IV Lopressor 1. Follow.  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  #9 hypothyroidism TSH was 1.5 on  08/16/2015. Continue Synthroid.  #10 hyperlipidemia LDL was 95 04/21/2015 Continue Lipitor.  #11 depression/anxiety Stable. No homicidal or suicidal ideations. Continue Zoloft and Ativan.  #12 new onset A. fib with RVR Likely secondary to COPD exacerbation. Patient, back in sinus tachycardia. CHA2DS2Vasc score is 3. Patient on anticoagulation. First set of troponin slightly elevated at 0.7. Serial enzymes pending. Patient with recent 2-D echo on 08/17/2015 with a EF of 65-70%. No wall motion abnormalities. Patient off Cardizem drip.  #13 recurrent non-small cell lung cancer patient on concurrent chemoradiation. Outpatient follow-up. Will inform oncology of patient's admission via Epic.  #14 prophylaxis PPI for GI prophylaxis. XARELTO for DVT prophylaxis.   Code Status: DO NOT RESUSCITATE Family Communication: Updated patient. No family present. Disposition Plan: Remain the step down unit.   Consultants:  None  Procedures:  Chest x-ray 09/18/2015  Antibiotics:  IV vancomycin 09/29/2015  IV aztreonam 10/05/2015  HPI/Subjective: Patient on breathing treatment. No chest pain. Some shortness of breath.  Objective: Filed Vitals:   09/20/15 0600 09/20/15 0800  BP: 123/74   Pulse: 106   Temp:  98.1 F (36.7 C)  Resp: 25     Intake/Output Summary (Last 24 hours) at 09/20/15 0843 Last data filed at 09/20/15 0600  Gross per 24 hour  Intake   1060 ml  Output    800 ml  Net    260 ml   Filed Weights   09/18/2015 1533 09/20/15 0100  Weight: 61.689 kg (136 lb) 65.4 kg (144 lb 2.9 oz)    Exam:   General:  NAD  Cardiovascular: Tachycardic, no murmurs rubs or gallops.  Respiratory: Coarse breath sounds. Some diffuse wheezing. Some rhonchi. Congestion noted.  Abdomen: Soft, nontender, nondistended, positive bowel sounds.  Musculoskeletal: No  clubbing cyanosis or edema. Right upper extremity forearm wrapped.  Data Reviewed: Basic Metabolic Panel:  Recent  Labs Lab 09/18/15 1029 09/18/2015 1535 09/20/15 0340  NA 136 132* 133*  K 3.7 3.7 3.8  CL  --  94* 101  CO2 '24 23 26  '$ GLUCOSE 296* 553* 387*  BUN 13.'6 18 9  '$ CREATININE 0.8 0.83 0.50  CALCIUM 8.1* 8.0* 6.8*   Liver Function Tests:  Recent Labs Lab 09/18/15 1029 10/13/2015 1535  AST 17 23  ALT 16 22  ALKPHOS 120 114  BILITOT 0.63 0.8  PROT 6.6 6.9  ALBUMIN 3.0* 3.3*   No results for input(s): LIPASE, AMYLASE in the last 168 hours. No results for input(s): AMMONIA in the last 168 hours. CBC:  Recent Labs Lab 09/18/15 1029 09/25/2015 1535 09/20/15 0340  WBC 16.5* 21.6* 13.3*  NEUTROABS 13.7* 18.5*  --   HGB 11.8 12.6 10.3*  HCT 38.1 40.5 33.4*  MCV 78.1* 80.5 81.9  PLT 258 249 147*   Cardiac Enzymes:  Recent Labs Lab 09/20/15 0211  TROPONINI 0.07*   BNP (last 3 results)  Recent Labs  04/20/15 0340 08/08/15 0543 09/20/15 0500  BNP 32.5 37.2 220.0*    ProBNP (last 3 results) No results for input(s): PROBNP in the last 8760 hours.  CBG:  Recent Labs Lab 09/23/2015 2156 09/20/15 0745  GLUCAP 391* 384*    Recent Results (from the past 240 hour(s))  MRSA PCR Screening     Status: None   Collection Time: 10/15/2015 11:42 PM  Result Value Ref Range Status   MRSA by PCR NEGATIVE NEGATIVE Final    Comment:        The GeneXpert MRSA Assay (FDA approved for NASAL specimens only), is one component of a comprehensive MRSA colonization surveillance program. It is not intended to diagnose MRSA infection nor to guide or monitor treatment for MRSA infections.      Studies: Dg Chest Port 1 View  09/29/2015  CLINICAL DATA:  Shortness of breath with congestion today. History of COPD and lung cancer. Initial encounter. EXAM: PORTABLE CHEST 1 VIEW COMPARISON:  Radiographs 08/19/2015.  CT 08/16/2015. FINDINGS: 1549 hours. Mild patient rotation to the right. There is stable volume loss in the right hemithorax with partial right upper lobe collapse. There is stable  right paratracheal, right hilar and subcarinal fullness. Right IJ Port-A-Cath tip is unchanged near the SVC right atrial junction. SVC stent is noted. The left lung is clear. There is no pleural effusion or pneumothorax. IMPRESSION: No significant change in the appearance of the chest is demonstrated. There is stable partial right upper lobe collapse status post SVC stenting for lung cancer. Electronically Signed   By: Richardean Sale M.D.   On: 09/18/2015 16:08    Scheduled Meds: . sodium chloride   Intravenous STAT  . aspirin  81 mg Oral Daily  . atorvastatin  20 mg Oral QHS  . aztreonam  2 g Intravenous 3 times per day  . budesonide-formoterol  2 puff Inhalation BID  . cholecalciferol  1,000 Units Oral Daily  . cyanocobalamin  1,500 mcg Oral Daily  . docusate sodium  100 mg Oral BID  . guaiFENesin  1,200 mg Oral BID  . insulin aspart  0-15 Units Subcutaneous TID WC  . insulin glargine  5 Units Subcutaneous Daily  . ipratropium  0.5 mg Nebulization Q6H  . levalbuterol  1.25 mg Nebulization 4 times per day  . levothyroxine  137 mcg Oral Daily  .  magnesium oxide  400 mg Oral Daily  . methylPREDNISolone (SOLU-MEDROL) injection  60 mg Intravenous 3 times per day  . metoprolol  5 mg Intravenous Once  . MSM  1,000 mg Oral Daily  . multivitamin with minerals  1 tablet Oral Daily  . omega-3 acid ethyl esters  1 g Oral BID  . rivaroxaban  20 mg Oral Q supper  . sertraline  150 mg Oral QHS  . sodium chloride  3 mL Intravenous Q12H  . vancomycin  750 mg Intravenous Q12H   Continuous Infusions: . sodium chloride    . diltiazem (CARDIZEM) infusion Stopped (10/02/2015 2126)    Principal Problem:   COPD with acute exacerbation ([place]) Active Problems:   Primary cancer of right upper lobe of lung (HCC)   Hypothyroidism   DM type 2 (diabetes mellitus, type 2) ([place])   HLD (hyperlipidemia)   Anxiety state   Sepsis ([place])   DVT (deep venous thrombosis) (HCC)   Unilateral vocal cord paralysis    Long term current use of anticoagulant therapy   Acute on chronic respiratory failure (HCC)   Depression   PE (physical exam), annual   Skin infection   Atrial fibrillation with RVR ([place])   Hypocalcemia    Time spent: 40 mins    [facility] MD [facility] Pager [phone]. If 7PM-7AM, please contact night-coverage at [email or web address], password [facility] 09/20/2015, 8:43 AM  LOS: 1 day

## 2015-09-20 NOTE — Progress Notes (Signed)
PHARMACIST - PHYSICIAN ORDER COMMUNICATION  CONCERNING: P&T Medication Policy on Herbal Medications  DESCRIPTION:  This patient's order for:  MSM has been noted.  This product(s) is classified as an "herbal" or natural product. Due to a lack of definitive safety studies or FDA approval, nonstandard manufacturing practices, plus the potential risk of unknown drug-drug interactions while on inpatient medications, the Pharmacy and Therapeutics Committee does not permit the use of "herbal" or natural products of this type within [facility].   ACTION TAKEN: The pharmacy department is unable to verify this order at this time and your patient has been informed of this safety policy. Please reevaluate patient's clinical condition at discharge and address if the herbal or natural product(s) should be resumed at that time.  Doreene Eland, PharmD, BCPS.   Pager: [phone] 09/20/2015 9:56 AM

## 2015-09-20 NOTE — Progress Notes (Signed)
Informed. Dr. Grandville Silos about pt glucose of 459 at 1148.  Dr. Grandville Silos said to administer 26 units of novolog.  Irven Baltimore, RN

## 2015-09-21 ENCOUNTER — Inpatient Hospital Stay (HOSPITAL_COMMUNITY): Payer: Medicare Other

## 2015-09-21 DIAGNOSIS — E119 Type 2 diabetes mellitus without complications: Secondary | ICD-10-CM | POA: Insufficient documentation

## 2015-09-21 LAB — COMPREHENSIVE METABOLIC PANEL
ALBUMIN: 2.5 g/dL — AB (ref 3.5–5.0)
ALK PHOS: 85 U/L (ref 38–126)
ALT: 19 U/L (ref 14–54)
ANION GAP: 6 (ref 5–15)
AST: 17 U/L (ref 15–41)
BILIRUBIN TOTAL: 0.6 mg/dL (ref 0.3–1.2)
BUN: 13 mg/dL (ref 6–20)
CALCIUM: 7.6 mg/dL — AB (ref 8.9–10.3)
CO2: 29 mmol/L (ref 22–32)
CREATININE: 0.51 mg/dL (ref 0.44–1.00)
Chloride: 101 mmol/L (ref 101–111)
GLUCOSE: 283 mg/dL — AB (ref 65–99)
POTASSIUM: 3.7 mmol/L (ref 3.5–5.1)
Sodium: 136 mmol/L (ref 135–145)
TOTAL PROTEIN: 5.5 g/dL — AB (ref 6.5–8.1)

## 2015-09-21 LAB — CBC
HEMATOCRIT: 30.6 % — AB (ref 36.0–46.0)
Hemoglobin: 9.4 g/dL — ABNORMAL LOW (ref 12.0–15.0)
MCH: 24.6 pg — AB (ref 26.0–34.0)
MCHC: 30.7 g/dL (ref 30.0–36.0)
MCV: 80.1 fL (ref 78.0–100.0)
Platelets: 135 10*3/uL — ABNORMAL LOW (ref 150–400)
RBC: 3.82 MIL/uL — ABNORMAL LOW (ref 3.87–5.11)
RDW: 16.3 % — AB (ref 11.5–15.5)
WBC: 12.7 10*3/uL — ABNORMAL HIGH (ref 4.0–10.5)

## 2015-09-21 LAB — MAGNESIUM: Magnesium: 2 mg/dL (ref 1.7–2.4)

## 2015-09-21 LAB — GLUCOSE, CAPILLARY
GLUCOSE-CAPILLARY: 281 mg/dL — AB (ref 65–99)
GLUCOSE-CAPILLARY: 354 mg/dL — AB (ref 65–99)
GLUCOSE-CAPILLARY: 240 mg/dL — AB (ref 65–99)

## 2015-09-21 LAB — URINE CULTURE

## 2015-09-21 MED ORDER — ARFORMOTEROL TARTRATE 15 MCG/2ML IN NEBU
15.0000 ug | INHALATION_SOLUTION | Freq: Two times a day (BID) | RESPIRATORY_TRACT | Status: DC
  Administered 2015-09-21 – 2015-10-04 (×26): 15 ug via RESPIRATORY_TRACT
  Filled 2015-09-21 (×28): qty 2

## 2015-09-21 MED ORDER — FLUTICASONE PROPIONATE 50 MCG/ACT NA SUSP
2.0000 | Freq: Every day | NASAL | Status: DC
  Administered 2015-09-21 – 2015-10-03 (×13): 2 via NASAL
  Filled 2015-09-21 (×2): qty 16

## 2015-09-21 MED ORDER — MICONAZOLE NITRATE 2 % VA CREA
1.0000 | TOPICAL_CREAM | Freq: Every day | VAGINAL | Status: AC
  Administered 2015-09-21 – 2015-09-27 (×7): 1 via VAGINAL
  Filled 2015-09-21 (×2): qty 45

## 2015-09-21 MED ORDER — BUDESONIDE 0.25 MG/2ML IN SUSP
0.2500 mg | Freq: Two times a day (BID) | RESPIRATORY_TRACT | Status: DC
  Administered 2015-09-21 – 2015-09-29 (×17): 0.25 mg via RESPIRATORY_TRACT
  Filled 2015-09-21 (×16): qty 2

## 2015-09-21 MED ORDER — MICONAZOLE NITRATE 2 % VA CREA: 1.0000 | TOPICAL_CREAM | Freq: Every day | VAGINAL | Status: DC

## 2015-09-21 MED ORDER — FUROSEMIDE 10 MG/ML IJ SOLN
40.0000 mg | Freq: Once | INTRAMUSCULAR | Status: AC
  Administered 2015-09-21: 40 mg via INTRAVENOUS
  Filled 2015-09-21: qty 4

## 2015-09-21 MED ORDER — FLUCONAZOLE 100 MG PO TABS
150.0000 mg | ORAL_TABLET | Freq: Once | ORAL | Status: AC
  Administered 2015-09-21: 150 mg via ORAL
  Filled 2015-09-21: qty 2

## 2015-09-21 MED ORDER — INSULIN GLARGINE 100 UNIT/ML ~~LOC~~ SOLN
24.0000 [IU] | Freq: Every day | SUBCUTANEOUS | Status: DC
  Administered 2015-09-21: 24 [IU] via SUBCUTANEOUS
  Filled 2015-09-21 (×2): qty 0.24

## 2015-09-21 MED ORDER — MONTELUKAST SODIUM 10 MG PO TABS: 10.0000 mg | ORAL_TABLET | Freq: Every day | ORAL | Status: DC

## 2015-09-21 MED ORDER — METHYLPREDNISOLONE SODIUM SUCC 125 MG IJ SOLR
60.0000 mg | Freq: Two times a day (BID) | INTRAMUSCULAR | Status: DC
  Administered 2015-09-21 – 2015-09-23 (×4): 60 mg via INTRAVENOUS
  Filled 2015-09-21 (×4): qty 2

## 2015-09-21 MED ORDER — SPIRONOLACTONE 25 MG PO TABS
25.0000 mg | ORAL_TABLET | Freq: Every day | ORAL | Status: DC
Start: 2015-09-22 — End: 2015-10-04
  Administered 2015-09-22 – 2015-10-04 (×13): 25 mg via ORAL
  Filled 2015-09-21 (×13): qty 1

## 2015-09-21 MED ORDER — METOPROLOL TARTRATE 1 MG/ML IV SOLN: 5.0000 mg | Freq: Four times a day (QID) | INTRAVENOUS | Status: DC | PRN

## 2015-09-21 MED ORDER — MONTELUKAST SODIUM 10 MG PO TABS
10.0000 mg | ORAL_TABLET | Freq: Every day | ORAL | Status: DC
  Administered 2015-09-21 – 2015-10-03 (×13): 10 mg via ORAL
  Filled 2015-09-21 (×14): qty 1

## 2015-09-21 MED ORDER — GLUCERNA SHAKE PO LIQD
237.0000 mL | Freq: Three times a day (TID) | ORAL | Status: DC
  Administered 2015-09-21 – 2015-10-04 (×33): 237 mL via ORAL
  Filled 2015-09-21 (×47): qty 237

## 2015-09-21 NOTE — Progress Notes (Signed)
Utilization Review Completed.Gita Dilger T12/01/2015  

## 2015-09-21 NOTE — Progress Notes (Signed)
CSW consulted for help with pt court date scheduled for Monday 09/22/15.  CSW faxed letter explaining pt inpatient status to [facility]- faxed copy to traffic court and to district judge.    CSW also provided copies of the letter to the patient.  No further needs at this time- CSW signing off  Domenica Reamer, [place] Social Worker [phone]

## 2015-09-21 NOTE — Progress Notes (Addendum)
Overnight patient began to develop coarse crackles overnight. Pt.denied SOB. Oxygen saturation 95-99% on 3 L [place].  Pt.was receiving NS @ 75 ml/hr.Notified on call MD with [facility]. New order placed to d/c NS @ 71m /hr. Pt.had no signs of respiratory failure during the shift.

## 2015-09-21 NOTE — Progress Notes (Signed)
[facility] PROGRESS NOTE  Janice Ball LOV:[id number] DOB: 1956/02/19 DOA: 10/14/2015 PCP: [name], NP  Assessment/Plan: #1 acute COPD exacerbation  Questionable etiology. Chest x-ray negative for any acute infiltrate. Janice Ball sounds very congested. Janice Ball with less diffuse wheezing and some coarse breath sounds. Influenza PCR negative. Continue Atrovent. Continue Xopenex scheduled nebulizers. Continue Mucinex, IV steroids.  Change Symbicort to Pulmicort and Brovana. Add singular. Add Flonase. Chest PT.  NT suction PRN.Follow.  #2 sepsis Janice Ball on admission was noted to have a leukocytosis with a white count of 21.6, tachycardic, with a elevated lactate level. May be secondary to skin infection and COPD exacerbation. Blood cultures are pending. Chest x-ray negative for any acute infiltrate. Urine cultures are pending. Repeat lactate level improved at 1.1. WBC trending down currently at 12.7 from 21.6 on admission. Continue IV fluids, empiric IV antibiotics, treatment for COPD exacerbation. Follow.  #3 leukocytosis Leukocytosis trending down. Janice Ball has been pancultured. Cultures pending. Continue empiric IV antibiotics.  #4 skin infection over right lateral forearm Bandage around forearm. Continue empiric IV vancomycin and IV aztreonam. Wound care consult.  #5 diabetes mellitus type 2 Hemoglobin A1c was 8.2 on 08/16/2015. Janice Ball CBG on admission was 553. CBG range from 281-459.  Lantus will be increased to 26 units daily. Janice Ball has been placed on 6 units meal coverage NovoLog. Continue resistant sliding scale insulin.  #6 hypocalcemia Corrected calcium is 8.8. Ionized calcium, PTH, magnesium, phosphorus, vitamin D levels pending. Continue calcium citrate 500 mg 4 times a day with water and vitamin D 1000 international units by mouth daily with water. Follow.  #7 sinus tachycardia Likely secondary to problem #1 and 2. Tachycardia improved. Follow.  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  #9 hypothyroidism TSH was 1.5 on 08/16/2015. Continue Synthroid.  #10 hyperlipidemia LDL was 95 04/21/2015 Continue Lipitor.  #11 depression/anxiety Stable. No homicidal or suicidal ideations. Continue Zoloft and Ativan.  #12 paroxysmal new onset A. fib with RVR Likely secondary to COPD exacerbation. Janice Ball, back in sinus tachycardia. CHA2DS2Vasc score is 3. Janice Ball on anticoagulation. First set of troponin slightly elevated at 0.7. Serial enzymes trended down. Janice Ball with recent 2-D echo on 08/17/2015 with a EF of 65-70%. No wall motion abnormalities. Janice Ball off Cardizem drip.  #13 recurrent non-small cell lung cancer Janice Ball on concurrent chemoradiation. Outpatient follow-up. Will inform oncology of Janice Ball's admission via Epic.  #14 prophylaxis PPI for GI prophylaxis. XARELTO for DVT prophylaxis.   Code Status: DO NOT RESUSCITATE Family Communication: Updated Janice Ball. No family present. Disposition Plan: Remain the step down unit.   Consultants:  None  Procedures:  Chest x-ray 10/17/2015  Antibiotics:  IV vancomycin 09/29/2015  IV aztreonam 10/10/2015  HPI/Subjective: Janice Ball c/o SOB. No CP. Still with no improvement with cough.  Objective: Filed Vitals:   09/21/15 0600 09/21/15 0800  BP: 134/72   Pulse: 103   Temp:  97.4 F (36.3 C)  Resp: 20     Intake/Output Summary (Last 24 hours) at 09/21/15 0839 Last data filed at 09/21/15 0100  Gross per 24 hour  Intake   1215 ml  Output    750 ml  Net    465 ml   Filed Weights   10/02/2015 1533 09/20/15 0100  Weight: 61.689 kg (136 lb) 65.4 kg (144 lb 2.9 oz)    Exam:   General:  NAD  Cardiovascular: Tachycardic, no murmurs rubs or gallops.  Respiratory: Coarse breath sounds.Less diffuse wheezing. Some  rhonchi. Congestion noted.  Abdomen: Soft, nontender, nondistended, positive bowel sounds.  Musculoskeletal: No  clubbing cyanosis or edema. Right upper extremity forearm wrapped.  Data Reviewed: Basic Metabolic Panel:  Recent Labs Lab 09/18/15 1029 09/18/2015 1535 09/20/15 0340 09/20/15 0830 09/21/15 0500  NA 136 132* 133*  --  136  K 3.7 3.7 3.8  --  3.7  CL  --  94* 101  --  101  CO2 '24 23 26  '$ --  29  GLUCOSE 296* 553* 387*  --  283*  BUN 13.'6 18 9  '$ --  13  CREATININE 0.8 0.83 0.50  --  0.51  CALCIUM 8.1* 8.0* 6.8*  --  7.6*  MG  --   --   --  1.5* 2.0  PHOS  --   --   --  4.0  --    Liver Function Tests:  Recent Labs Lab 09/18/15 1029 09/30/2015 1535 09/21/15 0500  AST '17 23 17  '$ ALT '16 22 19  '$ ALKPHOS 120 114 85  BILITOT 0.63 0.8 0.6  PROT 6.6 6.9 5.5*  ALBUMIN 3.0* 3.3* 2.5*   No results for input(s): LIPASE, AMYLASE in the last 168 hours. No results for input(s): AMMONIA in the last 168 hours. CBC:  Recent Labs Lab 09/18/15 1029 10/07/2015 1535 09/20/15 0340 09/21/15 0500  WBC 16.5* 21.6* 13.3* 12.7*  NEUTROABS 13.7* 18.5*  --   --   HGB 11.8 12.6 10.3* 9.4*  HCT 38.1 40.5 33.4* 30.6*  MCV 78.1* 80.5 81.9 80.1  PLT 258 249 147* 135*   Cardiac Enzymes:  Recent Labs Lab 09/20/15 0211 09/20/15 0830 09/20/15 1600  TROPONINI 0.07* 0.04* 0.04*   BNP (last 3 results)  Recent Labs  04/20/15 0340 08/08/15 0543 09/20/15 0500  BNP 32.5 37.2 220.0*    ProBNP (last 3 results) No results for input(s): PROBNP in the last 8760 hours.  CBG:  Recent Labs Lab 09/20/15 0745 09/20/15 1145 09/20/15 1609 09/20/15 2146 09/21/15 0804  GLUCAP 384* 459* 373* 287* 281*    Recent Results (from the past 240 hour(s))  Blood Culture (routine x 2)     Status: None (Preliminary result)   Collection Time: 10/16/2015  3:34 PM  Result Value Ref Range Status   Specimen Description BLOOD LEFT ARM  5 ML IN [facility] BOTTLE  Final   Special Requests NONE  Final   Culture   Final    NO GROWTH < 24 HOURS Performed at [facility]    Report Status PENDING  Incomplete  Blood  Culture (routine x 2)     Status: None (Preliminary result)   Collection Time: 10/15/2015  3:43 PM  Result Value Ref Range Status   Specimen Description BLOOD RIGHT PORT  5 ML IN [facility] BOTTLE  Final   Special Requests NONE  Final   Culture   Final    NO GROWTH < 24 HOURS Performed at [facility]    Report Status PENDING  Incomplete  Urine culture     Status: None (Preliminary result)   Collection Time: 10/10/2015  9:34 PM  Result Value Ref Range Status   Specimen Description URINE, CLEAN CATCH  Final   Special Requests NONE  Final   Culture   Final    NO GROWTH < 12 HOURS Performed at [facility]    Report Status PENDING  Incomplete  MRSA PCR Screening     Status: None   Collection Time: 09/18/2015 11:42 PM  Result Value  Ref Range Status   MRSA by PCR NEGATIVE NEGATIVE Final    Comment:        The GeneXpert MRSA Assay (FDA approved for NASAL specimens only), is one component of a comprehensive MRSA colonization surveillance program. It is not intended to diagnose MRSA infection nor to guide or monitor treatment for MRSA infections.   Culture, expectorated sputum-assessment     Status: None   Collection Time: 09/20/15 10:04 AM  Result Value Ref Range Status   Specimen Description SPUTUM  Final   Special Requests Immunocompromised  Final   Sputum evaluation   Final    THIS SPECIMEN IS ACCEPTABLE. RESPIRATORY CULTURE REPORT TO FOLLOW.   Report Status 09/20/2015 FINAL  Final     Studies: Dg Chest Port 1 View  10/02/2015  CLINICAL DATA:  Shortness of breath with congestion today. History of COPD and lung cancer. Initial encounter. EXAM: PORTABLE CHEST 1 VIEW COMPARISON:  Radiographs 08/19/2015.  CT 08/16/2015. FINDINGS: 1549 hours. Mild Janice Ball rotation to the right. There is stable volume loss in the right hemithorax with partial right upper lobe collapse. There is stable right paratracheal, right hilar and subcarinal fullness. Right IJ Port-A-Cath tip is unchanged  near the SVC right atrial junction. SVC stent is noted. The left lung is clear. There is no pleural effusion or pneumothorax. IMPRESSION: No significant change in the appearance of the chest is demonstrated. There is stable partial right upper lobe collapse status post SVC stenting for lung cancer. Electronically Signed   By: Richardean Sale M.D.   On: 09/22/2015 16:08    Scheduled Meds: . aspirin  81 mg Oral Daily  . atorvastatin  20 mg Oral QHS  . aztreonam  2 g Intravenous 3 times per day  . budesonide-formoterol  2 puff Inhalation BID  . calcium citrate  200 mg of elemental calcium Oral QID  . cholecalciferol  1,000 Units Oral Daily  . cyanocobalamin  1,500 mcg Oral Daily  . docusate sodium  100 mg Oral BID  . furosemide  40 mg Intravenous Once  . guaiFENesin  1,200 mg Oral BID  . insulin aspart  0-20 Units Subcutaneous TID WC  . insulin aspart  0-5 Units Subcutaneous QHS  . insulin aspart  6 Units Subcutaneous TID WC  . insulin glargine  24 Units Subcutaneous Daily  . ipratropium  0.5 mg Nebulization Q6H  . levalbuterol  1.25 mg Nebulization Q6H  . levothyroxine  137 mcg Oral Daily  . magnesium oxide  400 mg Oral Daily  . methylPREDNISolone (SOLU-MEDROL) injection  60 mg Intravenous 3 times per day  . multivitamin with minerals  1 tablet Oral Daily  . omega-3 acid ethyl esters  1 g Oral BID  . rivaroxaban  20 mg Oral Q supper  . sertraline  150 mg Oral QHS  . sodium chloride  3 mL Intravenous Q12H  . vancomycin  750 mg Intravenous Q12H   Continuous Infusions: . diltiazem (CARDIZEM) infusion Stopped (10/10/2015 2126)    Principal Problem:   COPD with acute exacerbation (HCC) Active Problems:   Primary cancer of right upper lobe of lung (HCC)   Hypothyroidism   DM type 2 (diabetes mellitus, type 2) ([place])   HLD (hyperlipidemia)   Anxiety state   Sepsis ([place])   DVT (deep venous thrombosis) (HCC)   Unilateral vocal cord paralysis   Long term current use of anticoagulant  therapy   Acute on chronic respiratory failure (HCC)   Depression   PE (physical  exam), annual   Skin infection   Atrial fibrillation with RVR ([place])   Hypocalcemia   Malignant neoplasm of upper lobe of lung ([place])    Time spent: 40 mins    [facility] MD [facility] Pager [phone]. If 7PM-7AM, please contact night-coverage at [email or web address], password [facility] 09/21/2015, 8:39 AM  LOS: 2 days

## 2015-09-21 NOTE — Progress Notes (Signed)
Initial Nutrition Assessment  INTERVENTION:   -Provide Glucerna Shake po TID, each supplement provides 220 kcal and 10 grams of protein -Encourage PO intake -RD to continue to monitor  NUTRITION DIAGNOSIS:   Unintentional weight loss related to chronic illness as evidenced by percent weight loss.  GOAL:   Patient will meet greater than or equal to 90% of their needs  MONITOR:   PO intake, Supplement acceptance, Labs, Weight trends, Skin, I & O's  REASON FOR ASSESSMENT:   Malnutrition Screening Tool    ASSESSMENT:   59 y.o. female with PMH of recurrent non-small cell lung cancer (on on chemotherapy and s/p of radiation, follows with Dr. [name]), SVC syndrome, PE and DVT on Xarelto, COPD, type 2 diabetes mellitus, hypothyroidism and anxiety, and left vocal cord paralysis with weak breathing for which she underwent left medialization thyroplasty on 10/4 by Dr. [name] (ENT), who presents with productive cough, SOB and the skin infection.  Patient reports eating 100% of her breakfast this morning, she had a BLT and fruit. Pt reports good appetite but she has had unexplained weight loss, ~40 lb this past year. UBW is around 200 lb. Pt has lost 21 lb since 10/31 (13% weight loss x >1 month, significant for time frame). Patient reports a home health nurse encouraged her to drink supplements, RD to order Glucerna shakes TID as pt's CBGs are in the 200-300s.  Nutrition focused physical exam shows no sign of depletion of muscle mass or body fat.  Labs reviewed: CBGs: 281-373 Mg WNL  Diet Order:  Diet Carb Modified Fluid consistency:: Thin; Room service appropriate?: Yes  Skin:  Reviewed, no issues  Last BM:  12/2  Height:   Ht Readings from Last 1 Encounters:  09/20/15 '5\' 7"'$  (1.702 m)    Weight:   Wt Readings from Last 1 Encounters:  09/20/15 144 lb 2.9 oz (65.4 kg)    Ideal Body Weight:  61.4 kg  BMI:  Body mass index is 22.58 kg/(m^2).  Estimated Nutritional Needs:    Kcal:  1900-2100  Protein:  90-100g  Fluid:  2L/day  EDUCATION NEEDS:   No education needs identified at this time  Clayton Bibles, MS, RD, LDN Pager: [phone] After Hours Pager: [phone]

## 2015-09-22 LAB — VITAMIN D 25 HYDROXY (VIT D DEFICIENCY, FRACTURES): Vit D, 25-Hydroxy: 25.2 ng/mL — ABNORMAL LOW (ref 30.0–100.0)

## 2015-09-22 LAB — BASIC METABOLIC PANEL
ANION GAP: 10 (ref 5–15)
BUN: 19 mg/dL (ref 6–20)
CALCIUM: 7.9 mg/dL — AB (ref 8.9–10.3)
CO2: 30 mmol/L (ref 22–32)
Chloride: 95 mmol/L — ABNORMAL LOW (ref 101–111)
Creatinine, Ser: 0.54 mg/dL (ref 0.44–1.00)
GLUCOSE: 364 mg/dL — AB (ref 65–99)
POTASSIUM: 3.6 mmol/L (ref 3.5–5.1)
SODIUM: 135 mmol/L (ref 135–145)

## 2015-09-22 LAB — CALCIUM, IONIZED: Calcium, Ionized, Serum: 3.8 mg/dL — ABNORMAL LOW (ref 4.5–5.6)

## 2015-09-22 LAB — PTH, INTACT AND CALCIUM
CALCIUM TOTAL (PTH): 7 mg/dL — AB (ref 8.7–10.3)
PTH: 32 pg/mL (ref 15–65)

## 2015-09-22 LAB — CBC
HCT: 30.4 % — ABNORMAL LOW (ref 36.0–46.0)
Hemoglobin: 9.3 g/dL — ABNORMAL LOW (ref 12.0–15.0)
MCH: 25 pg — ABNORMAL LOW (ref 26.0–34.0)
MCHC: 30.6 g/dL (ref 30.0–36.0)
MCV: 81.7 fL (ref 78.0–100.0)
PLATELETS: 162 10*3/uL (ref 150–400)
RBC: 3.72 MIL/uL — AB (ref 3.87–5.11)
RDW: 16.8 % — AB (ref 11.5–15.5)
WBC: 11 10*3/uL — AB (ref 4.0–10.5)

## 2015-09-22 LAB — GLUCOSE, CAPILLARY
GLUCOSE-CAPILLARY: 156 mg/dL — AB (ref 65–99)
GLUCOSE-CAPILLARY: 275 mg/dL — AB (ref 65–99)
Glucose-Capillary: 282 mg/dL — ABNORMAL HIGH (ref 65–99)
Glucose-Capillary: 395 mg/dL — ABNORMAL HIGH (ref 65–99)
Glucose-Capillary: 156 mg/dL — ABNORMAL HIGH (ref 65–99)

## 2015-09-22 LAB — MAGNESIUM: MAGNESIUM: 1.7 mg/dL (ref 1.7–2.4)

## 2015-09-22 LAB — CALCITRIOL (1,25 DI-OH VIT D): Vit D, 1,25-Dihydroxy: 54.4 pg/mL (ref 19.9–79.3)

## 2015-09-22 MED ORDER — POTASSIUM CHLORIDE CRYS ER 20 MEQ PO TBCR
40.0000 meq | EXTENDED_RELEASE_TABLET | Freq: Once | ORAL | Status: AC
  Administered 2015-09-22: 40 meq via ORAL
  Filled 2015-09-22: qty 2

## 2015-09-22 MED ORDER — INSULIN ASPART 100 UNIT/ML ~~LOC~~ SOLN
8.0000 [IU] | Freq: Three times a day (TID) | SUBCUTANEOUS | Status: DC
  Administered 2015-09-22 – 2015-09-27 (×15): 8 [IU] via SUBCUTANEOUS

## 2015-09-22 MED ORDER — METOPROLOL TARTRATE 25 MG PO TABS
25.0000 mg | ORAL_TABLET | Freq: Two times a day (BID) | ORAL | Status: DC
  Administered 2015-09-22: 25 mg via ORAL
  Filled 2015-09-22: qty 1

## 2015-09-22 MED ORDER — FLUCONAZOLE IN SODIUM CHLORIDE 100-0.9 MG/50ML-% IV SOLN
100.0000 mg | INTRAVENOUS | Status: DC
  Administered 2015-09-23 – 2015-10-02 (×10): 100 mg via INTRAVENOUS
  Filled 2015-09-22 (×12): qty 50

## 2015-09-22 MED ORDER — INSULIN GLARGINE 100 UNIT/ML ~~LOC~~ SOLN
28.0000 [IU] | Freq: Every day | SUBCUTANEOUS | Status: DC
  Administered 2015-09-22 – 2015-09-24 (×3): 28 [IU] via SUBCUTANEOUS
  Filled 2015-09-22 (×5): qty 0.28

## 2015-09-22 MED ORDER — MAGNESIUM SULFATE 4 GM/100ML IV SOLN
4.0000 g | Freq: Once | INTRAVENOUS | Status: AC
  Administered 2015-09-22: 4 g via INTRAVENOUS
  Filled 2015-09-22: qty 100

## 2015-09-22 MED ORDER — LEVALBUTEROL HCL 1.25 MG/0.5ML IN NEBU
1.2500 mg | INHALATION_SOLUTION | Freq: Three times a day (TID) | RESPIRATORY_TRACT | Status: DC
  Administered 2015-09-22 – 2015-09-24 (×7): 1.25 mg via RESPIRATORY_TRACT
  Filled 2015-09-22 (×7): qty 0.5

## 2015-09-22 MED ORDER — METOPROLOL TARTRATE 12.5 MG HALF TABLET
12.5000 mg | ORAL_TABLET | Freq: Two times a day (BID) | ORAL | Status: DC
  Administered 2015-09-22: 12.5 mg via ORAL
  Filled 2015-09-22: qty 1

## 2015-09-22 MED ORDER — DOXYCYCLINE HYCLATE 100 MG PO TABS
100.0000 mg | ORAL_TABLET | Freq: Two times a day (BID) | ORAL | Status: DC
  Administered 2015-09-22 – 2015-10-04 (×25): 100 mg via ORAL
  Filled 2015-09-22 (×27): qty 1

## 2015-09-22 MED ORDER — FLUCONAZOLE IN SODIUM CHLORIDE 200-0.9 MG/100ML-% IV SOLN
200.0000 mg | Freq: Once | INTRAVENOUS | Status: AC
  Administered 2015-09-22: 200 mg via INTRAVENOUS
  Filled 2015-09-22: qty 100

## 2015-09-22 MED ORDER — IPRATROPIUM BROMIDE 0.02 % IN SOLN
0.5000 mg | Freq: Three times a day (TID) | RESPIRATORY_TRACT | Status: DC
  Administered 2015-09-22 – 2015-09-24 (×7): 0.5 mg via RESPIRATORY_TRACT
  Filled 2015-09-22 (×7): qty 2.5

## 2015-09-22 NOTE — Progress Notes (Signed)
Pharmacy Antibiotic Time-Out Note  Janice Ball is a 59 y.o. year-old female admitted on 09/28/2015.  The patient is currently on Aztreonam/Vancomycin for rule out sepsis/AECOPD/skin infection.  Assessment/Plan: After discussion with Dr. Grandville Silos, the current antibiotic(s) Aztreonam/Vanc will be narrowed to Aztreonam and PO doxy and continued for until blood cultures final days.  The stop date is entered and will be determined at a later date.  Temp (24hrs), Avg:97.8 F (36.6 C), Min:97.2 F (36.2 C), Max:98.6 F (37 C)   Recent Labs Lab 09/18/15 1029 10/12/2015 1535 09/20/15 0340 09/21/15 0500 09/22/15 0340  WBC 16.5* 21.6* 13.3* 12.7* 11.0*    Recent Labs Lab 09/18/15 1029 10/12/2015 1535 09/20/15 0340 09/21/15 0500 09/22/15 0340  CREATININE 0.8 0.83 0.50 0.51 0.54   Estimated Creatinine Clearance: 71.8 mL/min (by C-G formula based on Cr of 0.54).   Antimicrobial allergies: Keflex, PCNS  Antimicrobials this admission: 12/2 >>rocephin  >> x1 ED 12/2 >>zmax >> x1 ED 12/3 >>vancomycin  >>  12/5 12/3 >>azactam >> 12/5 >> PO Doxy >>  Levels/dose changes this admission:  Microbiology Results:  12/2 blood: NGTD 12/2 urine: NGTD 12/2 sputum: ngtd  Adrian Saran, PharmD, BCPS Pager [phone] 09/22/2015 8:49 AM

## 2015-09-22 NOTE — Progress Notes (Addendum)
At 23:55, patient was medicated a PRN oral dose of Lorazepam, upon her request, for complaint of significant anxiety with consequential sleep difficulty, with good outcome. Sleep was immediate and sustained after aforementioned pharmacologic intervention. The remainder of the shift progressed uneventfully. Patient remains on supplementary oxygen therapy overnight, via 2 LPM/[place]. Pulse oximetry readings remained within the predominant range of the mid-upper 90's.  Skin assessment revealed presence of confluent ecchymotic areas of varying ages, more prominently occurring over bilateral anterior mammary regions and over the left anterior knee, and of sparser distribution over the abdomen and both upper extremities. There is evidence of bilateral periorbital ecchymosis and edema, which have improved in terms severity, according to patient.

## 2015-09-22 NOTE — Consult Note (Addendum)
WOC wound consult note Reason for Consult:chronic wound following surgical closure with sutures and subsequent removal Wound type: traumatic Pressure Ulcer POA: No Measurement:2cm x 2cm with 0.4cm depth Wound bed: red, moist Drainage (amount, consistency, odor) scant serous on old dressing Periwound:intact Dressing procedure/placement/frequency: I will implement a conservative POC consisting of daily cleanse with placement of an antimicrobial cover dressing (bismuth and petrolatum, xeroform). [place] nursing team will not follow, but will remain available to this patient, the nursing and medical teams.  Please re-consult if needed. Thanks, Maudie Flakes, MSN, RN, [place], Arther Abbott  Pager# [phone]

## 2015-09-22 NOTE — Progress Notes (Signed)
Physical Therapy Treatment Patient Details Name: Janice Ball MRN: [id number] DOB: May 21, 1956 Today's Date: 09/22/2015    History of Present Illness Pt admitted with COPD exacerbation and hx of lung CA, PE and DM, Vocal cord paralysis    PT Comments    Patient is progressing but limited by dyspnea, ambulated on  RA with sats appearing to drop to 86(wave form not really clear). Did place on 1 liter. HR 119.  Follow Up Recommendations  Home health PT;Supervision - Intermittent     Equipment Recommendations  None recommended by PT    Recommendations for Other Services       Precautions / Restrictions Precautions Precautions: Fall    Mobility  Bed Mobility               General bed mobility comments: on bed edge  Transfers Overall transfer level: Needs assistance Equipment used: Rolling walker (2 wheeled) Transfers: Sit to/from Stand Sit to Stand: Min guard         General transfer comment: extra effort to stand by patient, cues for hand placement  Ambulation/Gait Ambulation/Gait assistance: Min assist;+2 safety/equipment   Assistive device: Rolling walker (2 wheeled) Gait Pattern/deviations: Step-through pattern Gait velocity: decr   General Gait Details: cues for posture, pacing and position from RW.  MUltiple short rests in standing to obtain sats. Ambulated x 100' on RA with stas dropping to 86/ replaced to 1 liter with sats  to 94%. HR  112-119, RR hi 42 with  increased SOB   Stairs            Wheelchair Mobility    Modified Rankin (Stroke Patients Only)       Balance           Standing balance support: During functional activity;No upper extremity supported Standing balance-Leahy Scale: Fair                      Cognition Arousal/Alertness: Awake/alert                          Exercises      General Comments        Pertinent Vitals/Pain Pain Assessment: No/denies pain    Home Living Family/patient  expects to be discharged to:: Private residence                    Prior Function            PT Goals (current goals can now be found in the care plan section) Acute Rehab PT Goals Patient Stated Goal: Home with dtr PT Goal Formulation: With patient Progress towards PT goals: Progressing toward goals    Frequency  Min 3X/week    PT Plan Current plan remains appropriate    Co-evaluation             End of Session Equipment Utilized During Treatment: Gait belt;Oxygen;Other (comment) Activity Tolerance: Patient tolerated treatment well Patient left: in bed;with call bell/phone within reach     Time: 1103-1120 PT Time Calculation (min) (ACUTE ONLY): 17 min  Charges:  $Gait Training: 8-22 mins                    G Codes:      Claretha Cooper 09/22/2015, 12:46 PM Tresa Endo PT [phone]

## 2015-09-22 NOTE — Care Management Important Message (Signed)
Important Message  Patient Details  Name: Janice Ball MRN: [id number] Date of Birth: [birth year]   Medicare Important Message Given:  Yes    Shelda Altes 09/22/2015, 1:39 [place] Message  Patient Details  Name: Janice Ball MRN: [id number] Date of Birth: 1956/10/10   Medicare Important Message Given:  Yes    Shelda Altes 09/22/2015, 1:39 PM

## 2015-09-22 NOTE — Progress Notes (Signed)
[facility] PROGRESS NOTE  Janice Ball ZOX:[id number] DOB: [birth year] DOA: 09/18/2015 PCP: [name], NP  Assessment/Plan: #1 acute COPD exacerbation  Questionable etiology. Chest x-ray negative for any acute infiltrate. Patient sounds very congested. Patient with less diffuse wheezing and some coarse breath sounds. Influenza PCR negative. Continue Atrovent, Xopenex scheduled nebulizers, Mucinex, IV steroids taper.  Continue Pulmicort and Brovana, singular, Flonase. Chest PT.  NT suction PRN.Follow.  #2 sepsis Patient on admission was noted to have a leukocytosis with a white count of 21.6, tachycardic, with a elevated lactate level. May be secondary to skin infection and COPD exacerbation. Blood cultures are pending. Chest x-ray negative for any acute infiltrate. Urine cultures are pending. Repeat lactate level improved at 1.1. WBC trending down currently at 11 from 21.6 on admission. Continue IV fluids, treatment for COPD exacerbation.  DC IV vancomycin. Continue IV aztreonam. Stat oral doxycycline for skin infection. Follow.  #3 leukocytosis Leukocytosis trending down. Patient has been pancultured. Cultures pending. Continue empiric IV antibiotics.  #4 skin infection over right lateral forearm Bandage around forearm. Continue empiric IV aztreonam. Start oral doxycycline.  Wound care consult.  #5 diabetes mellitus type 2 Hemoglobin A1c was 8.2 on 08/16/2015. Patient CBG on admission was 553. CBG range from 156-282.  Lantus will be increased to 28 units daily. Increase meal coverage insulin to 8 units NovoLog. Continue resistant sliding scale insulin.  #6 hypocalcemia Corrected calcium is now 9.1. Ionized calcium was 3.8, PTH pending, magnesium was 1.5, phosphorus WNL, vitamin D levels pending. Continue calcium citrate 500 mg 4 times a day with water and vitamin D 1000 international units by mouth daily with water. Follow.  #7 sinus tachycardia Likely secondary to problem #1 and  2. Tachycardia improved however still tachycardic and seems chronic in review of patient's epic. Will place on low dose beta blocker and titrate for better rate control. Follow.  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  #9 hypothyroidism TSH was 1.5 on 08/16/2015. Continue Synthroid.  #10 hyperlipidemia LDL was 95  On 04/21/2015 Continue Lipitor.  #11 depression/anxiety Stable. No homicidal or suicidal ideations. Continue Zoloft and Ativan.  #12 paroxysmal/Transient new onset A. fib with RVR >>>>>SINUS TACHYCARDIA Likely secondary to COPD exacerbation. Patient, back in sinus tachycardia which is chronic. CHA2DS2Vasc score is 3. Patient on anticoagulation. First set of troponin slightly elevated at 0.7. Serial enzymes trended down. Patient with recent 2-D echo on 08/17/2015 with a EF of 65-70%. No wall motion abnormalities. Patient off Cardizem drip. patient's heart rate is still elevated and in looking back at her records has been elevated in the 120s to 130s chronically. TSH at 1.500 on 08/16/2015 . Will place patient on low-dose Lopressor for better rate control and titrate.   #13 recurrent non-small cell lung cancer patient on concurrent chemoradiation. Outpatient follow-up. Informed oncology of patient's admission via Epic.  #14 vaginal candidiasis Status post Diflucan 150 mg 1. Continue miconazole cream.  #15 prophylaxis PPI for GI prophylaxis. XARELTO for DVT prophylaxis.   Code Status: DO NOT RESUSCITATE Family Communication: Updated patient. No family present. Disposition Plan: Remain the step down unit. Hopefully transfer to telemetry floor tomorrow.   Consultants:  None  Procedures:  Chest x-ray 10/05/2015  Antibiotics:  IV vancomycin 09/26/2015>>>>09/22/2015 IV aztreonam 10/11/2015 Oral doxycycline 09/22/2015   HPI/Subjective: Patient states she's feeling better than yesterday. Shortness of breath  slowly improving.  Objective: Filed Vitals:   09/22/15 0749 09/22/15 0800  BP:  147/83   Pulse: 103   Temp:  97.2 F (36.2 C)  Resp: 20     Intake/Output Summary (Last 24 hours) at 09/22/15 0823 Last data filed at 09/22/15 0430  Gross per 24 hour  Intake    203 ml  Output   3375 ml  Net  -3172 ml   Filed Weights   10/14/2015 1533 09/20/15 0100 09/22/15 0400  Weight: 61.689 kg (136 lb) 65.4 kg (144 lb 2.9 oz) 65.6 kg (144 lb 10 oz)    Exam:   General:  NAD  Cardiovascular: Tachycardic, no murmurs rubs or gallops.  Respiratory: Coarse breath sounds.Less diffuse wheezing. Some rhonchi. Congestion noted.  Abdomen: Soft, nontender, nondistended, positive bowel sounds.  Musculoskeletal: No clubbing cyanosis or edema. Right upper extremity forearm wrapped.  Data Reviewed: Basic Metabolic Panel:  Recent Labs Lab 09/18/15 1029 10/05/2015 1535 09/20/15 0340 09/20/15 0830 09/21/15 0500 09/22/15 0340  NA 136 132* 133*  --  136 135  K 3.7 3.7 3.8  --  3.7 3.6  CL  --  94* 101  --  101 95*  CO2 '24 23 26  '$ --  29 30  GLUCOSE 296* 553* 387*  --  283* 364*  BUN 13.'6 18 9  '$ --  13 19  CREATININE 0.8 0.83 0.50  --  0.51 0.54  CALCIUM 8.1* 8.0* 6.8*  --  7.6* 7.9*  MG  --   --   --  1.5* 2.0 1.7  PHOS  --   --   --  4.0  --   --    Liver Function Tests:  Recent Labs Lab 09/18/15 1029 10/17/2015 1535 09/21/15 0500  AST '17 23 17  '$ ALT '16 22 19  '$ ALKPHOS 120 114 85  BILITOT 0.63 0.8 0.6  PROT 6.6 6.9 5.5*  ALBUMIN 3.0* 3.3* 2.5*   No results for input(s): LIPASE, AMYLASE in the last 168 hours. No results for input(s): AMMONIA in the last 168 hours. CBC:  Recent Labs Lab 09/18/15 1029 09/30/2015 1535 09/20/15 0340 09/21/15 0500 09/22/15 0340  WBC 16.5* 21.6* 13.3* 12.7* 11.0*  NEUTROABS 13.7* 18.5*  --   --   --   HGB 11.8 12.6 10.3* 9.4* 9.3*  HCT 38.1 40.5 33.4* 30.6* 30.4*  MCV 78.1* 80.5 81.9 80.1 81.7  PLT 258 249 147* 135* 162   Cardiac Enzymes:  Recent  Labs Lab 09/20/15 0211 09/20/15 0830 09/20/15 1600  TROPONINI 0.07* 0.04* 0.04*   BNP (last 3 results)  Recent Labs  04/20/15 0340 08/08/15 0543 09/20/15 0500  BNP 32.5 37.2 220.0*    ProBNP (last 3 results) No results for input(s): PROBNP in the last 8760 hours.  CBG:  Recent Labs Lab 09/21/15 0804 09/21/15 1201 09/21/15 1630 09/21/15 2219 09/22/15 0756  GLUCAP 281* 354* 240* 156* 282*    Recent Results (from the past 240 hour(s))  Blood Culture (routine x 2)     Status: None (Preliminary result)   Collection Time: 10/06/2015  3:34 PM  Result Value Ref Range Status   Specimen Description BLOOD LEFT ARM  5 ML IN [facility] BOTTLE  Final   Special Requests NONE  Final   Culture   Final    NO GROWTH 2 DAYS Performed at [facility]    Report Status PENDING  Incomplete  Blood Culture (routine x 2)     Status: None (Preliminary result)   Collection Time: 09/30/2015  3:43 PM  Result Value Ref Range Status   Specimen  Description BLOOD RIGHT PORT  5 ML IN [facility] BOTTLE  Final   Special Requests NONE  Final   Culture   Final    NO GROWTH 2 DAYS Performed at [facility]    Report Status PENDING  Incomplete  Urine culture     Status: None   Collection Time: 10/05/2015  9:34 PM  Result Value Ref Range Status   Specimen Description URINE, CLEAN CATCH  Final   Special Requests NONE  Final   Culture   Final    MULTIPLE SPECIES PRESENT, SUGGEST RECOLLECTION Performed at [facility]    Report Status 09/21/2015 FINAL  Final  MRSA PCR Screening     Status: None   Collection Time: 09/25/2015 11:42 PM  Result Value Ref Range Status   MRSA by PCR NEGATIVE NEGATIVE Final    Comment:        The GeneXpert MRSA Assay (FDA approved for NASAL specimens only), is one component of a comprehensive MRSA colonization surveillance program. It is not intended to diagnose MRSA infection nor to guide or monitor treatment for MRSA infections.   Culture, respiratory  (NON-Expectorated)     Status: None (Preliminary result)   Collection Time: 09/20/15  8:00 AM  Result Value Ref Range Status   Specimen Description SPUTUM  Final   Special Requests NONE  Final   Gram Stain   Final    THIS SPECIMEN IS ACCEPTABLE FOR SPUTUM CULTURE MODERATE WBC PRESENT, PREDOMINANTLY PMN RARE SQUAMOUS EPITHELIAL CELLS PRESENT RARE GRAM POSITIVE COCCI IN PAIRS RARE GRAM POSITIVE RODS    Culture PENDING  Incomplete   Report Status PENDING  Incomplete  Culture, expectorated sputum-assessment     Status: None   Collection Time: 09/20/15 10:04 AM  Result Value Ref Range Status   Specimen Description SPUTUM  Final   Special Requests Immunocompromised  Final   Sputum evaluation   Final    THIS SPECIMEN IS ACCEPTABLE. RESPIRATORY CULTURE REPORT TO FOLLOW.   Report Status 09/20/2015 FINAL  Final     Studies: Dg Chest Port 1 View  09/21/2015  CLINICAL DATA:  Shortness of breath. History of COPD and lung cancer. EXAM: PORTABLE CHEST 1 VIEW COMPARISON:  09/30/2015 and 08/19/2015. FINDINGS: 0900 hours. The right IJ Port-A-Cath tip appears unchanged near the SVC right atrial junction. SVC stent noted. There is stable right paratracheal and right hilar fullness from recent CT. There is stable volume loss in the right hemithorax, although the partial right upper lobe collapse demonstrated on CT does appear improved. No new airspace disease, significant pleural effusion or pneumothorax. IMPRESSION: No significant change in appearance of the chest. The right upper lobe does appear partially re-expanded compared with CT of 5 weeks ago. Electronically Signed   By: Richardean Sale M.D.   On: 09/21/2015 10:21    Scheduled Meds: . arformoterol  15 mcg Nebulization BID  . aspirin  81 mg Oral Daily  . atorvastatin  20 mg Oral QHS  . aztreonam  2 g Intravenous 3 times per day  . budesonide (PULMICORT) nebulizer solution  0.25 mg Nebulization BID  . calcium citrate  200 mg of elemental calcium  Oral QID  . cholecalciferol  1,000 Units Oral Daily  . cyanocobalamin  1,500 mcg Oral Daily  . docusate sodium  100 mg Oral BID  . feeding supplement (GLUCERNA SHAKE)  237 mL Oral TID BM  . fluticasone  2 spray Each Nare Daily  . guaiFENesin  1,200 mg Oral BID  .  insulin aspart  0-20 Units Subcutaneous TID WC  . insulin aspart  0-5 Units Subcutaneous QHS  . insulin aspart  8 Units Subcutaneous TID WC  . insulin glargine  28 Units Subcutaneous Daily  . ipratropium  0.5 mg Nebulization Q6H  . levalbuterol  1.25 mg Nebulization Q6H  . levothyroxine  137 mcg Oral Daily  . magnesium oxide  400 mg Oral Daily  . magnesium sulfate 1 - 4 g bolus IVPB  4 g Intravenous Once  . methylPREDNISolone (SOLU-MEDROL) injection  60 mg Intravenous Q12H  . metoprolol tartrate  12.5 mg Oral BID  . miconazole  1 Applicatorful Vaginal QHS  . montelukast  10 mg Oral QHS  . multivitamin with minerals  1 tablet Oral Daily  . omega-3 acid ethyl esters  1 g Oral BID  . potassium chloride  40 mEq Oral Once  . rivaroxaban  20 mg Oral Q supper  . sertraline  150 mg Oral QHS  . sodium chloride  3 mL Intravenous Q12H  . spironolactone  25 mg Oral Daily  . vancomycin  750 mg Intravenous Q12H   Continuous Infusions:    Principal Problem:   COPD with acute exacerbation (HCC) Active Problems:   Primary cancer of right upper lobe of lung (HCC)   Hypothyroidism   DM type 2 (diabetes mellitus, type 2) ([place])   HLD (hyperlipidemia)   Anxiety state   Sinus tachycardia (HCC)   Sepsis ([place])   DVT (deep venous thrombosis) (HCC)   Unilateral vocal cord paralysis   Long term current use of anticoagulant therapy   Acute on chronic respiratory failure (HCC)   Depression   PE (physical exam), annual   Skin infection   Atrial fibrillation with RVR ([place])   Hypocalcemia   Malignant neoplasm of upper lobe of lung ([place])   DM type 2, goal A1c below 7    Time spent: 35 mins    [facility] MD [facility] Pager [phone]. If 7PM-7AM, please contact night-coverage at [email or web address], password [facility] 09/22/2015, 8:23 AM  LOS: 3 days

## 2015-09-22 NOTE — Evaluation (Signed)
Occupational Therapy Evaluation Patient Details Name: [name] MRN: [id number] DOB: [birth year] Today's Date: 09/22/2015    History of Present Illness Pt admitted with COPD exacerbation and hx of lung CA, PE and DM, Vocal cord paralysis   Clinical Impression   Pt admitted with COPD exacerbation. Pt currently with functional limitations due to the deficits listed below (see OT Problem List).  Pt will benefit from skilled OT to increase their safety and independence with ADL and functional mobility for ADL to facilitate discharge to venue listed below.      Follow Up Recommendations  Home health OT    Equipment Recommendations  None recommended by OT       Precautions / Restrictions Precautions Precautions: Fall      Mobility Bed Mobility               General bed mobility comments: on bed edge  Transfers Overall transfer level: Needs assistance Equipment used: Rolling walker (2 wheeled) Transfers: Sit to/from Stand Sit to Stand: Min assist         General transfer comment: extra effort to stand by patient, cues for hand placement    Balance           Standing balance support: During functional activity;No upper extremity supported Standing balance-Leahy Scale: Fair                              ADL Overall ADL's : Needs assistance/impaired Eating/Feeding: Supervision/ safety;Sitting   Grooming: Supervision/safety;Sitting   Upper Body Bathing: Minimal assitance;Sitting   Lower Body Bathing: Maximal assistance;Sit to/from stand   Upper Body Dressing : Set up;Sitting   Lower Body Dressing: Maximal assistance;Sit to/from stand                 General ADL Comments: pt sitting EOB with daughter and chaplain arrived to speak with pt and daughter               Pertinent Vitals/Pain Pain Assessment: No/denies pain     Hand Dominance Right   Extremity/Trunk Assessment             Communication  Communication Communication: No difficulties   Cognition Arousal/Alertness: Awake/alert Behavior During Therapy: WFL for tasks assessed/performed Overall Cognitive Status: Within Functional Limits for tasks assessed                                Home Living Family/patient expects to be discharged to:: Private residence Living Arrangements: Children Available Help at Discharge: Family Type of Home: House Home Access: Ramped entrance     Home Layout: One level     Bathroom Shower/Tub: Teacher, early years/pre: Standard     Home Equipment: Environmental consultant - standard   Additional Comments: Pt states she will discuss RW with dtr.        Prior Functioning/Environment Level of Independence: Independent with assistive device(s)        Comments: Family assistance with house work - ambulated with cane    OT Diagnosis: Generalized weakness   OT Problem List: Decreased strength;Decreased activity tolerance   OT Treatment/Interventions: Self-care/ADL training;Energy conservation;DME and/or AE instruction;Patient/family education    OT Goals(Current goals can be found in the care plan section) Acute Rehab OT Goals Patient Stated Goal: Home with dtr OT Goal Formulation: With patient Time For Goal Achievement: 10/06/15 Potential to Achieve Goals:  Good  OT Frequency: Min 2X/week              End of Session    Activity Tolerance: Patient limited by fatigue Patient left: in bed;Other (comment) (sitting edge of chair)   Time: 8250-5397 OT Time Calculation (min): 9 min Charges:  OT General Charges $OT Visit: 1 Procedure OT Evaluation $Initial OT Evaluation Tier I: 1 Procedure G-Codes:    Betsy Pries 09-24-15, 3:54 PM

## 2015-09-23 DIAGNOSIS — B370 Candidal stomatitis: Secondary | ICD-10-CM | POA: Diagnosis present

## 2015-09-23 LAB — GLUCOSE, CAPILLARY
GLUCOSE-CAPILLARY: 314 mg/dL — AB (ref 65–99)
GLUCOSE-CAPILLARY: 331 mg/dL — AB (ref 65–99)
GLUCOSE-CAPILLARY: 312 mg/dL — AB (ref 65–99)

## 2015-09-23 LAB — COMPREHENSIVE METABOLIC PANEL
ALBUMIN: 2.4 g/dL — AB (ref 3.5–5.0)
ALT: 34 U/L (ref 14–54)
ANION GAP: 7 (ref 5–15)
AST: 40 U/L (ref 15–41)
Alkaline Phosphatase: 76 U/L (ref 38–126)
BUN: 19 mg/dL (ref 6–20)
CHLORIDE: 97 mmol/L — AB (ref 101–111)
CO2: 32 mmol/L (ref 22–32)
CREATININE: 0.47 mg/dL (ref 0.44–1.00)
Calcium: 8.2 mg/dL — ABNORMAL LOW (ref 8.9–10.3)
GFR calc non Af Amer: >60 mL/min (ref >60)
GLUCOSE: 332 mg/dL — AB (ref 65–99)
Potassium: 4.2 mmol/L (ref 3.5–5.1)
SODIUM: 136 mmol/L (ref 135–145)
Total Bilirubin: 0.2 mg/dL — ABNORMAL LOW (ref 0.3–1.2)
Total Protein: 5.4 g/dL — ABNORMAL LOW (ref 6.5–8.1)

## 2015-09-23 LAB — CULTURE, RESPIRATORY W GRAM STAIN

## 2015-09-23 LAB — CBC
HCT: 29.8 % — ABNORMAL LOW (ref 36.0–46.0)
HEMOGLOBIN: 9 g/dL — AB (ref 12.0–15.0)
MCH: 24.4 pg — AB (ref 26.0–34.0)
MCHC: 30.2 g/dL (ref 30.0–36.0)
MCV: 80.8 fL (ref 78.0–100.0)
Platelets: 141 10*3/uL — ABNORMAL LOW (ref 150–400)
RBC: 3.69 MIL/uL — AB (ref 3.87–5.11)
RDW: 16.7 % — ABNORMAL HIGH (ref 11.5–15.5)
WBC: 10.4 10*3/uL (ref 4.0–10.5)

## 2015-09-23 LAB — CULTURE, RESPIRATORY

## 2015-09-23 LAB — MAGNESIUM: Magnesium: 1.8 mg/dL (ref 1.7–2.4)

## 2015-09-23 MED ORDER — LORATADINE 10 MG PO TABS
10.0000 mg | ORAL_TABLET | Freq: Every day | ORAL | Status: DC
  Administered 2015-09-23 – 2015-09-27 (×5): 10 mg via ORAL
  Filled 2015-09-23 (×5): qty 1

## 2015-09-23 MED ORDER — PREDNISONE 50 MG PO TABS
60.0000 mg | ORAL_TABLET | Freq: Every day | ORAL | Status: AC
  Administered 2015-09-24 – 2015-09-26 (×3): 60 mg via ORAL
  Filled 2015-09-23 (×3): qty 1

## 2015-09-23 MED ORDER — METOPROLOL TARTRATE 25 MG PO TABS
25.0000 mg | ORAL_TABLET | Freq: Two times a day (BID) | ORAL | Status: DC
  Administered 2015-09-23 – 2015-09-27 (×9): 25 mg via ORAL
  Filled 2015-09-23 (×9): qty 1

## 2015-09-23 MED ORDER — PANTOPRAZOLE SODIUM 40 MG PO TBEC
40.0000 mg | DELAYED_RELEASE_TABLET | Freq: Every day | ORAL | Status: DC
Start: 2015-09-23 — End: 2015-09-27
  Administered 2015-09-23 – 2015-09-27 (×5): 40 mg via ORAL
  Filled 2015-09-23 (×8): qty 1

## 2015-09-23 MED ORDER — METOPROLOL TARTRATE 12.5 MG HALF TABLET: 12.5000 mg | ORAL_TABLET | Freq: Two times a day (BID) | ORAL | Status: DC

## 2015-09-23 MED ORDER — CETYLPYRIDINIUM CHLORIDE 0.05 % MT LIQD
7.0000 mL | Freq: Two times a day (BID) | OROMUCOSAL | Status: DC
  Administered 2015-09-23 – 2015-10-04 (×21): 7 mL via OROMUCOSAL

## 2015-09-23 NOTE — Progress Notes (Signed)
Inpatient Diabetes Program Recommendations  AACE/ADA: New Consensus Statement on Inpatient Glycemic Control (2015)  Target Ranges:  Prepandial:   less than 140 mg/dL      Peak postprandial:   less than 180 mg/dL (1-2 hours)      Critically ill patients:  140 - 180 mg/dL    Results for ANAKA, BEAZER (MRN [id number]) as of 09/23/2015 08:06  Ref. Range 09/22/2015 07:56 09/22/2015 12:02 09/22/2015 16:58 09/22/2015 21:19  Glucose-Capillary Latest Ref Range: 65-99 mg/dL 282 (H) 395 (H) 275 (H) 156 (H)    Results for SHANDI, GODFREY (MRN [id number]) as of 09/23/2015 08:06  Ref. Range 09/23/2015 03:41  Glucose Latest Ref Range: 65-99 mg/dL 332 (H)    Home DM Meds: Amaryl 2 mg daily       Metformin 1000 mg bid  Current Insulin Orders: Lantus 28 units daily      Novolog Resistant SSI (0-20 units) TID AC + HS      Novolog 8 units tidwc     -Note patient now getting Prednisone 60 mg daily as of this morning.  Last dose IV Solumedrol given today at 5am.  -Hopeful glucose levels will decrease now that IV steroids stopped.  -Note AM glucose today elevated (332 mg/dl).  Do not recommend any further Lantus or Novolog adjustments yet since IV steroids stopped and Prednisone started.  Patient may actually need insulin decrease once IV steroids leave her system.     --Will follow patient during hospitalization--  Wyn Quaker RN, MSN, CDE Diabetes Coordinator Inpatient Glycemic Control Team Team Pager: [phone] (8a-5p)

## 2015-09-23 NOTE — Progress Notes (Signed)
Pt states she is active with [place] and will continue at discharge. Will need resumption orders please.

## 2015-09-23 NOTE — Progress Notes (Signed)
[facility] PROGRESS NOTE  RITHIKA SEEL QBH:[id number] DOB: 03/28/1956 DOA: 10/15/2015 PCP: [name], NP  Brief interval history Patient is a 59 year old female history of recurrent non-small cell lung cancer on chemotherapy status post radiation been followed by Dr. 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 [name] (ENT) who presented with a productive cough shortness of breath and a skin infection. On admission patient none to have a transient A. fib and went back into her chronic sinus tachycardia. Patient was admitted to the step down unit and being treated for an acute COPD exacerbation and noted to be in sepsis with a skin infection also noted. Patient noted to have significant airway noise. Patient was placed on IV steroids, scheduled nebulizers, empiric IV antibiotics of vancomycin and aztreonam. Patient was pancultured and cultures pending. Sputum Gram stain and culture with moderate [place] consistent with Candida species. Patient placed on Diflucan. IV antibiotics, transitioned to oral antibiotics and IV steroids have been transitioned to oral prednisone. Patient improving slowly.    Assessment/Plan: #1 acute COPD exacerbation  Questionable etiology. Chest x-ray negative for any acute infiltrate. Patient sounds very congested with upper airway noise. Patient with less diffuse wheezing and some coarse breath sounds. Influenza PCR negative. Continue Atrovent, Xopenex scheduled nebulizers, Mucinex, change IV steroids to oral prednisone and taper.  Continue Pulmicort and Brovana, Claritin, Flonase. Chest PT. Add a PPI. NT suction PRN.Follow.  #2 sepsis Patient on admission was noted to have a leukocytosis with a white count of 21.6, tachycardic, with a elevated lactate level. May be secondary to skin infection and COPD exacerbation.  Blood cultures are pending. Chest x-ray negative for any acute infiltrate. Urine cultures are pending. Sputum Gram stain and culture with moderate yeast consistent with Candida species. Repeat lactate level improved at 1.1. WBC trending down currently at 10.4 from 21.6 on admission. Continue IV fluids, treatment for COPD exacerbation.  DC IV vancomycin. D/C IV aztreonam. Continue oral doxycycline for skin infection. Continue Diflucan. Follow.  #3 oral pharyngeal candidiasis Sputum Gram stain and culture with moderate ease consistent with Candida species. Patient with an acute COPD exacerbation on steroids. Patient with a history of recurrent non-small cell lung cancer on chemotherapy and also with a vaginal candidiasis. Will place on IV Diflucan and transitioned to oral Diflucan tomorrow and treat for total of 2 weeks.  #4 leukocytosis Leukocytosis trending down. Patient has been pancultured. Cultures pending. Continue empiric antibiotics.  #5 skin infection over right lateral forearm Bandage around forearm. D/C IV aztreonam. Continue oral doxycycline.  Wound care consult.  #6 diabetes mellitus type 2 Hemoglobin A1c was 8.2 on 08/16/2015. Patient CBG on admission was 553. CBG on BMET this morning is 332. Continue Lantus 28 units daily, meal coverage insulin 8 units NovoLog, resistant sliding scale insulin. IV steroids have been transitioned to oral steroids will need to follow CBGs closely and may need to decrease long-acting insulin. Follow for now.  #7 hypocalcemia Corrected calcium is now 9.5. Ionized calcium was 3.8, PTH is 32, magnesium was 1.5, phosphorus WNL, vitamin D levels pending. Continue calcium citrate 500 mg 4 times a day with water and vitamin D 1000 international units by mouth daily with water. Follow.  #8 sinus tachycardia Likely secondary to problem #1 and 2. Tachycardia improved however still tachycardic and seems chronic in review of patient's epic. Increase Lopressor to 25 mg  by mouth twice a day. Follow.  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  #10 hypothyroidism TSH was 1.5 on 08/16/2015. Continue Synthroid.  #11 hyperlipidemia LDL was 95  On 04/21/2015 Continue Lipitor.  #12 depression/anxiety Stable. No homicidal or suicidal ideations. Continue Zoloft and Ativan.  #13 paroxysmal/Transient new onset A. fib with RVR >>>>>SINUS TACHYCARDIA Likely secondary to COPD exacerbation. Patient, back in sinus tachycardia which is chronic. CHA2DS2Vasc score is 3. Patient on anticoagulation. First set of troponin slightly elevated at 0.7. Serial enzymes trended down. Patient with recent 2-D echo on 08/17/2015 with a EF of 65-70%. No wall motion abnormalities. Patient off Cardizem drip. patient's heart rate is still elevated and in looking back at her records has been elevated in the 120s to 130s chronically. TSH at 1.500 on 08/16/2015 . Increase Lopressor to 25 mg twice a day and titrate as needed.   #14 recurrent non-small cell lung cancer patient on concurrent chemoradiation. Outpatient follow-up. Informed oncology of patient's admission via Epic.Dr Earlie Server came by social visit.  #15 vaginal candidiasis Status post Diflucan 150 mg 1. Continue miconazole cream.  #16 prophylaxis PPI for GI prophylaxis. XARELTO for DVT prophylaxis.   Code Status: DO NOT RESUSCITATE Family Communication: Updated patient. No family present. Disposition Plan: Transfer to telemetry floor tomorrow.   Consultants:  None  Procedures:  Chest x-ray 09/22/2015  Antibiotics:  IV vancomycin 10/10/2015>>>>09/22/2015 IV aztreonam 09/23/2015>>>>>09/23/2015 Oral doxycycline 09/22/2015   HPI/Subjective: Patient states she's feeling better than yesterday. Shortness of breath slowly improving.Still feeling congested.  Objective: Filed Vitals:   09/23/15 0800 09/23/15 0822  BP: 117/62 117/62  Pulse: 88 94  Temp:     Resp: 20 22    Intake/Output Summary (Last 24 hours) at 09/23/15 0855 Last data filed at 09/23/15 0839  Gross per 24 hour  Intake    770 ml  Output      1 ml  Net    769 ml   Filed Weights   09/20/15 0100 09/22/15 0400 09/23/15 0500  Weight: 65.4 kg (144 lb 2.9 oz) 65.6 kg (144 lb 10 oz) 66.3 kg (146 lb 2.6 oz)    Exam:   General:  NAD  Cardiovascular: Tachycardic, no murmurs rubs or gallops.  Respiratory: Coarse breath sounds.Less wheezing. Upper airway noise. Congestion noted.  Abdomen: Soft, nontender, nondistended, positive bowel sounds.  Musculoskeletal: No clubbing cyanosis or edema. Right upper extremity forearm wrapped.  Data Reviewed: Basic Metabolic Panel:  Recent Labs Lab 10/01/2015 1535 09/20/15 0340 09/20/15 0830 09/21/15 0500 09/22/15 0340 09/23/15 0341  NA 132* 133*  --  136 135 136  K 3.7 3.8  --  3.7 3.6 4.2  CL 94* 101  --  101 95* 97*  CO2 23 26  --  29 30 32  GLUCOSE 553* 387*  --  283* 364* 332*  BUN 18 9  --  '13 19 19  '$ CREATININE 0.83 0.50  --  0.51 0.54 0.47  CALCIUM 8.0* 6.8* 7.0* 7.6* 7.9* 8.2*  MG  --   --  1.5* 2.0 1.7 1.8  PHOS  --   --  4.0  --   --   --    Liver Function Tests:  Recent Labs Lab 09/18/15 1029 10/02/2015 1535 09/21/15 0500 09/23/15 0341  AST '17 23 17 '$ 40  ALT '16 22 19 '$ 34  ALKPHOS 120 114 85 76  BILITOT 0.63 0.8 0.6 0.2*  PROT 6.6 6.9 5.5* 5.4*  ALBUMIN 3.0* 3.3* 2.5* 2.4*   No results for input(s): LIPASE, AMYLASE in the last 168 hours. No results for input(s): AMMONIA in the last 168 hours. CBC:  Recent Labs Lab 09/18/15 1029 09/30/2015 1535 09/20/15 0340 09/21/15 0500 09/22/15 0340 09/23/15 0341  WBC 16.5* 21.6* 13.3* 12.7* 11.0* 10.4  NEUTROABS 13.7* 18.5*  --   --   --   --   HGB 11.8 12.6 10.3* 9.4* 9.3* 9.0*  HCT 38.1 40.5 33.4* 30.6* 30.4* 29.8*  MCV 78.1* 80.5 81.9 80.1 81.7 80.8  PLT 258 249 147* 135* 162 141*   Cardiac Enzymes:  Recent Labs Lab 09/20/15 0211 09/20/15 0830  09/20/15 1600  TROPONINI 0.07* 0.04* 0.04*   BNP (last 3 results)  Recent Labs  04/20/15 0340 08/08/15 0543 09/20/15 0500  BNP 32.5 37.2 220.0*    ProBNP (last 3 results) No results for input(s): PROBNP in the last 8760 hours.  CBG:  Recent Labs Lab 09/21/15 2219 09/22/15 0756 09/22/15 1202 09/22/15 1658 09/22/15 2119  GLUCAP 156* 282* 395* 275* 156*    Recent Results (from the past 240 hour(s))  Blood Culture (routine x 2)     Status: None (Preliminary result)   Collection Time: 09/25/2015  3:34 PM  Result Value Ref Range Status   Specimen Description BLOOD LEFT ARM  5 ML IN [facility] BOTTLE  Final   Special Requests NONE  Final   Culture   Final    NO GROWTH 3 DAYS Performed at [facility]    Report Status PENDING  Incomplete  Blood Culture (routine x 2)     Status: None (Preliminary result)   Collection Time: 10/12/2015  3:43 PM  Result Value Ref Range Status   Specimen Description BLOOD RIGHT PORT  5 ML IN [facility] BOTTLE  Final   Special Requests NONE  Final   Culture   Final    NO GROWTH 3 DAYS Performed at [facility]    Report Status PENDING  Incomplete  Urine culture     Status: None   Collection Time: 09/18/2015  9:34 PM  Result Value Ref Range Status   Specimen Description URINE, CLEAN CATCH  Final   Special Requests NONE  Final   Culture   Final    MULTIPLE SPECIES PRESENT, SUGGEST RECOLLECTION Performed at [facility]    Report Status 09/21/2015 FINAL  Final  MRSA PCR Screening     Status: None   Collection Time: 10/06/2015 11:42 PM  Result Value Ref Range Status   MRSA by PCR NEGATIVE NEGATIVE Final    Comment:        The GeneXpert MRSA Assay (FDA approved for NASAL specimens only), is one component of a comprehensive MRSA colonization surveillance program. It is not intended to diagnose MRSA infection nor to guide or monitor treatment for MRSA infections.   Culture, respiratory (NON-Expectorated)     Status: None    Collection Time: 09/20/15  8:00 AM  Result Value Ref Range Status   Specimen Description SPUTUM  Final   Special Requests NONE  Final   Gram Stain   Final    THIS SPECIMEN IS ACCEPTABLE FOR SPUTUM CULTURE MODERATE WBC PRESENT, PREDOMINANTLY PMN RARE SQUAMOUS EPITHELIAL CELLS PRESENT RARE GRAM POSITIVE COCCI IN PAIRS RARE GRAM POSITIVE RODS    Culture   Final    MODERATE YEAST CONSISTENT WITH CANDIDA SPECIES Performed at [facility]    Report Status 09/23/2015 FINAL  Final  Culture, expectorated  sputum-assessment     Status: None   Collection Time: 09/20/15 10:04 AM  Result Value Ref Range Status   Specimen Description SPUTUM  Final   Special Requests Immunocompromised  Final   Sputum evaluation   Final    THIS SPECIMEN IS ACCEPTABLE. RESPIRATORY CULTURE REPORT TO FOLLOW.   Report Status 09/20/2015 FINAL  Final     Studies: Dg Chest Port 1 View  09/21/2015  CLINICAL DATA:  Shortness of breath. History of COPD and lung cancer. EXAM: PORTABLE CHEST 1 VIEW COMPARISON:  09/20/2015 and 08/19/2015. FINDINGS: 0900 hours. The right IJ Port-A-Cath tip appears unchanged near the SVC right atrial junction. SVC stent noted. There is stable right paratracheal and right hilar fullness from recent CT. There is stable volume loss in the right hemithorax, although the partial right upper lobe collapse demonstrated on CT does appear improved. No new airspace disease, significant pleural effusion or pneumothorax. IMPRESSION: No significant change in appearance of the chest. The right upper lobe does appear partially re-expanded compared with CT of 5 weeks ago. Electronically Signed   By: Richardean Sale M.D.   On: 09/21/2015 10:21    Scheduled Meds: . arformoterol  15 mcg Nebulization BID  . aspirin  81 mg Oral Daily  . atorvastatin  20 mg Oral QHS  . aztreonam  2 g Intravenous 3 times per day  . budesonide (PULMICORT) nebulizer solution  0.25 mg Nebulization BID  . calcium citrate  200 mg  of elemental calcium Oral QID  . cholecalciferol  1,000 Units Oral Daily  . cyanocobalamin  1,500 mcg Oral Daily  . docusate sodium  100 mg Oral BID  . doxycycline  100 mg Oral Q12H  . feeding supplement (GLUCERNA SHAKE)  237 mL Oral TID BM  . fluconazole (DIFLUCAN) IV  100 mg Intravenous Q24H  . fluticasone  2 spray Each Nare Daily  . guaiFENesin  1,200 mg Oral BID  . insulin aspart  0-20 Units Subcutaneous TID WC  . insulin aspart  0-5 Units Subcutaneous QHS  . insulin aspart  8 Units Subcutaneous TID WC  . insulin glargine  28 Units Subcutaneous Daily  . ipratropium  0.5 mg Nebulization TID  . levalbuterol  1.25 mg Nebulization TID  . levothyroxine  137 mcg Oral Daily  . magnesium oxide  400 mg Oral Daily  . metoprolol tartrate  12.5 mg Oral BID  . miconazole  1 Applicatorful Vaginal QHS  . montelukast  10 mg Oral QHS  . multivitamin with minerals  1 tablet Oral Daily  . omega-3 acid ethyl esters  1 g Oral BID  . [START ON 09/24/2015] predniSONE  60 mg Oral QAC breakfast  . rivaroxaban  20 mg Oral Q supper  . sertraline  150 mg Oral QHS  . sodium chloride  3 mL Intravenous Q12H  . spironolactone  25 mg Oral Daily   Continuous Infusions:    Principal Problem:   COPD with acute exacerbation (HCC) Active Problems:   Primary cancer of right upper lobe of lung (HCC)   Hypothyroidism   DM type 2 (diabetes mellitus, type 2) (HCC)   HLD (hyperlipidemia)   Anxiety state   Sinus tachycardia (HCC)   Sepsis ([place])   DVT (deep venous thrombosis) (HCC)   Unilateral vocal cord paralysis   Long term current use of anticoagulant therapy   Acute on chronic respiratory failure (HCC)   Depression   PE (physical exam), annual   Skin infection   Atrial fibrillation  with RVR ([place])   Hypocalcemia   Malignant neoplasm of upper lobe of lung ([place])   DM type 2, goal A1c below 7   Oropharyngeal candidiasis    Time spent: 35 mins    [facility] MD [facility] Pager  [phone]. If 7PM-7AM, please contact night-coverage at [email or web address], password [facility] 09/23/2015, 8:55 AM  LOS: 4 days

## 2015-09-23 NOTE — Progress Notes (Signed)
Received from SDU, agree with previous RN's assessment.

## 2015-09-23 NOTE — Progress Notes (Signed)
Physical Therapy Treatment Patient Details Name: Janice Ball MRN: [id number] DOB: Mar 16, 1956 Today's Date: 09/23/2015    History of Present Illness Pt admitted with COPD exacerbation and hx of lung CA, PE and DM, Vocal cord paralysis    PT Comments    Beginning of treatment (supine): HR 94, 02 sats 100% on 1L [place], RR 19, BP 137/75 with 90 MAP Removed 1L [place] (supine): 02 sats 96% on RA During treatment/ambulation: highest HR 107, one 02 sat drop to 86 quickly recovered to 91%, with average 02 sats fluctuating 90-96% on RA End of treatment (seated in [facility]): HR 102, 02 sats 97% on RA, RR 17  Pt. Modified independent with bed mobility requiring increased time; min guard/min assist for rise and stabilize with STS, requiring min-mod assist for controlled descent with STS and SPT as pt. Presented with posterior LOB with SPT to [facility]; ambualted ~80' with RW in hallway with no rest breaks but very decreased velocity and stride lengths; ambulated on RA with one 02 sat drop to 85% with quick recovery to 91% with PLB, average 02 sats during ambulation on RA 90-96% with 2/4 DOE    Follow Up Recommendations  Home health PT;Supervision - Intermittent     Equipment Recommendations  None recommended by PT    Recommendations for Other Services OT consult     Precautions / Restrictions Precautions Precautions: Fall Restrictions Weight Bearing Restrictions: No    Mobility  Bed Mobility Overal bed mobility: Modified Independent Bed Mobility: Supine to Sit     Supine to sit: Supervision;Min guard     General bed mobility comments: for safety  Transfers Overall transfer level: Needs assistance Equipment used: Rolling walker (2 wheeled) Transfers: Sit to/from [facility] Sit to Stand: Min guard;Min assist Stand pivot transfers: Min guard;Min assist       General transfer comment: cues for hand placement, assist to control descent - pt. LOB posterior with SPT to [facility]    Stand  to sit required assist  Ambulation/Gait Ambulation/Gait assistance: Min assist Ambulation Distance (Feet): 80 Feet Assistive device: Rolling walker (2 wheeled) Gait Pattern/deviations: Step-through pattern Gait velocity: decreased   General Gait Details: VC for position from RW; ambulated on RA with one 02 sat drop to 86 with quick elevation back to 91; 02 sats fluctuated 90-96% during ambulation on RA; highest RR 19, highest HR 107   Stairs            Wheelchair Mobility    Modified Rankin (Stroke Patients Only)       Balance                                    Cognition Arousal/Alertness: Awake/alert Behavior During Therapy: WFL for tasks assessed/performed Overall Cognitive Status: Within Functional Limits for tasks assessed                      Exercises      General Comments        Pertinent Vitals/Pain Pain Assessment: No/denies pain    Home Living                      Prior Function            PT Goals (current goals can now be found in the care plan section) Acute Rehab PT Goals Time For Goal Achievement: October 14, 2015 Potential to Achieve  Goals: Good Progress towards PT goals: Progressing toward goals    Frequency  Min 3X/week    PT Plan Current plan remains appropriate    Co-evaluation             End of Session Equipment Utilized During Treatment: Gait belt Activity Tolerance: Patient tolerated treatment well Patient left:  (in [facility] for preparation for transfer to 4th floor )     Time: 9450-3888 PT Time Calculation (min) (ACUTE ONLY): 30 min  Charges:  $Gait Training: 8-22 mins $Therapeutic Activity: 8-22 mins                    G CodesDenna Haggard, SPTA   09/23/2015 12:39 PM   Pager: [phone]   Reviewed and agree with above Rica Koyanagi  PTA [facility]  Acute  Rehab Pager      [phone]

## 2015-09-24 ENCOUNTER — Inpatient Hospital Stay (HOSPITAL_COMMUNITY): Payer: Medicare Other

## 2015-09-24 DIAGNOSIS — F329 Major depressive disorder, single episode, unspecified: Secondary | ICD-10-CM

## 2015-09-24 DIAGNOSIS — Z7901 Long term (current) use of anticoagulants: Secondary | ICD-10-CM

## 2015-09-24 DIAGNOSIS — I4891 Unspecified atrial fibrillation: Secondary | ICD-10-CM

## 2015-09-24 DIAGNOSIS — E785 Hyperlipidemia, unspecified: Secondary | ICD-10-CM

## 2015-09-24 LAB — BASIC METABOLIC PANEL
Anion gap: 7 (ref 5–15)
BUN: 20 mg/dL (ref 6–20)
CHLORIDE: 95 mmol/L — AB (ref 101–111)
CO2: 35 mmol/L — ABNORMAL HIGH (ref 22–32)
Calcium: 8.3 mg/dL — ABNORMAL LOW (ref 8.9–10.3)
Creatinine, Ser: 0.55 mg/dL (ref 0.44–1.00)
GFR calc Af Amer: >60 mL/min (ref >60)
GFR calc non Af Amer: >60 mL/min (ref >60)
GLUCOSE: 259 mg/dL — AB (ref 65–99)
POTASSIUM: 3.8 mmol/L (ref 3.5–5.1)
Sodium: 137 mmol/L (ref 135–145)

## 2015-09-24 LAB — CULTURE, BLOOD (ROUTINE X 2)
CULTURE: NO GROWTH
CULTURE: NO GROWTH

## 2015-09-24 LAB — GLUCOSE, CAPILLARY
GLUCOSE-CAPILLARY: 214 mg/dL — AB (ref 65–99)
GLUCOSE-CAPILLARY: 238 mg/dL — AB (ref 65–99)
GLUCOSE-CAPILLARY: 81 mg/dL (ref 65–99)
GLUCOSE-CAPILLARY: 353 mg/dL — AB (ref 65–99)
Glucose-Capillary: 59 mg/dL — ABNORMAL LOW (ref 65–99)

## 2015-09-24 LAB — CBC
HEMATOCRIT: 33.6 % — AB (ref 36.0–46.0)
HEMOGLOBIN: 10.3 g/dL — AB (ref 12.0–15.0)
MCH: 25.1 pg — AB (ref 26.0–34.0)
MCHC: 30.7 g/dL (ref 30.0–36.0)
MCV: 82 fL (ref 78.0–100.0)
Platelets: 146 10*3/uL — ABNORMAL LOW (ref 150–400)
RBC: 4.1 MIL/uL (ref 3.87–5.11)
RDW: 16.7 % — ABNORMAL HIGH (ref 11.5–15.5)
WBC: 11.3 10*3/uL — ABNORMAL HIGH (ref 4.0–10.5)

## 2015-09-24 LAB — MAGNESIUM: Magnesium: 1.4 mg/dL — ABNORMAL LOW (ref 1.7–2.4)

## 2015-09-24 MED ORDER — SODIUM CHLORIDE 0.9 % IJ SOLN
10.0000 mL | INTRAMUSCULAR | Status: DC | PRN
  Administered 2015-10-01 – 2015-10-03 (×2): 10 mL
  Filled 2015-09-24 (×2): qty 40

## 2015-09-24 MED ORDER — MAGNESIUM SULFATE 2 GM/50ML IV SOLN
2.0000 g | Freq: Once | INTRAVENOUS | Status: AC
  Administered 2015-09-24: 2 g via INTRAVENOUS
  Filled 2015-09-24: qty 50

## 2015-09-24 MED ORDER — SODIUM CHLORIDE 0.9 % IJ SOLN
10.0000 mL | Freq: Two times a day (BID) | INTRAMUSCULAR | Status: DC
Start: 2015-09-24 — End: 2015-10-04
  Administered 2015-09-25: 10 mL

## 2015-09-24 MED ORDER — IPRATROPIUM BROMIDE 0.02 % IN SOLN
0.5000 mg | Freq: Four times a day (QID) | RESPIRATORY_TRACT | Status: DC
  Administered 2015-09-24 – 2015-09-27 (×10): 0.5 mg via RESPIRATORY_TRACT
  Filled 2015-09-24 (×11): qty 2.5

## 2015-09-24 MED ORDER — LEVALBUTEROL HCL 1.25 MG/0.5ML IN NEBU
1.2500 mg | INHALATION_SOLUTION | Freq: Four times a day (QID) | RESPIRATORY_TRACT | Status: DC
  Administered 2015-09-24 – 2015-10-04 (×36): 1.25 mg via RESPIRATORY_TRACT
  Filled 2015-09-24 (×42): qty 0.5

## 2015-09-24 NOTE — Progress Notes (Signed)
[facility] PROGRESS NOTE  Janice Ball XHB:[id number] DOB: 07-19-1956 DOA: 09/22/2015 PCP: [name], NP  HPI/Brief narrative 59 year old female history of recurrent non-small cell lung cancer on chemotherapy status post radiation been followed by Dr. 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 [name] (ENT) who presented with a productive cough shortness of breath and a skin infection. On admission patient none to have a transient A. fib and went back into her chronic sinus tachycardia. Patient was admitted to the step down unit and being treated for an acute COPD exacerbation and noted to be in sepsis with a skin infection also noted. Patient noted to have significant airway noise. Patient was placed on IV steroids, scheduled nebulizers, empiric IV antibiotics of vancomycin and aztreonam. Patient was pancultured and cultures pending. Sputum Gram stain and culture with moderate [place] consistent with Candida species. Patient placed on Diflucan. IV antibiotics, transitioned to oral antibiotics and IV steroids have been transitioned to oral prednisone. Patient is slowly improving.  Assessment/Plan: #1 acute COPD exacerbation  Chest x-ray negative for any acute infiltrate. Patient remains congested with upper airway noise. Patient with less diffuse wheezing and some coarse breath sounds. Influenza PCR negative. Continue Atrovent, Xopenex scheduled nebulizers, Mucinex, change IV steroids to oral prednisone and taper. Continue Pulmicort and Brovana, Claritin, Flonase. Chest PT. Add a PPI. NT suction as needed. Patient reports some improvement with flutter valve. Cont expectorant as needed.  #2 sepsis Patient noted to have a leukocytosis with a white count of 21.6, tachycardic, with a elevated lactate level. May be secondary to skin infection and  COPD exacerbation. Blood cultures and urine cx unremarkable. Sputum Gram stain and culture with moderate yeast consistent with Candida species. Repeat lactate level improved at 1.1. WBC trending down since admission. Continue IV fluids, treatment for COPD exacerbation. DC'd IV vancomycin. D/C IV aztreonam. Continue oral doxycycline for skin infection. Continue Diflucan.  #3 oral pharyngeal candidiasis Sputum Gram stain and culture with moderate ease consistent with Candida species. Patient with an acute COPD exacerbation on steroids. Patient with a history of recurrent non-small cell lung cancer on chemotherapy and also with a vaginal candidiasis. Patient is continued on Diflucan with plans to treat for total of 2 weeks.  #4 leukocytosis Leukocytosis trending down. Patient has been pancultured. Cultures unremarkable thus far. Continue empiric antibiotics.  #5 skin infection over right lateral forearm Bandage around forearm. D/C IV aztreonam. Continue on oral doxycycline. Wound care consult.  #6 diabetes mellitus type 2 Hemoglobin A1c was 8.2 on 08/16/2015. Patient CBG on admission was 553. Patient is continued on Lantus 28 units daily, meal coverage insulin 8 units NovoLog, resistant sliding scale insulin. IV steroids have been transitioned to oral steroids will need to follow CBGs closely and may need to decrease long-acting insulin.  #7 hypocalcemia Continue calcium citrate 500 mg 4 times a day with water and vitamin D 1000 international units by mouth daily with water.   #8 sinus tachycardia Likely secondary to problem #1 and 2. Tachycardia improved however still tachycardic and seems chronic in review of patient's epic. Increase Lopressor to 25 mg by mouth twice a day. Follow.  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 Continue Synthroid.  #11 hyperlipidemia LDL was  95 On 04/21/2015 Continue Lipitor.  #  12 depression/anxiety Stable. No homicidal or suicidal ideations. Continue Zoloft and Ativan.  #13 paroxysmal/Transient new onset A. fib with RVR >>>>>SINUS TACHYCARDIA Likely secondary to COPD exacerbation. Patient, back in sinus tachycardia which is chronic. CHA2DS2Vasc score is 3. Patient on anticoagulation. First set of troponin slightly elevated at 0.7. Serial enzymes trended down. Patient with recent 2-D echo on 08/17/2015 with a EF of 65-70%. No wall motion abnormalities. Patient off Cardizem drip. patient's heart rate is still elevated and in looking back at her records has been elevated in the 120s to 130s chronically. TSH at 1.500 on 08/16/2015 . Increase Lopressor to 25 mg twice a day and titrate as needed.   #14 recurrent non-small cell lung cancer patient on concurrent chemoradiation. Outpatient follow-up. Informed oncology of patient's admission via Epic.Dr Earlie Server came by social visit.  #15 vaginal candidiasis Status post Diflucan 150 mg 1. Continue miconazole cream.  #16 prophylaxis PPI for GI prophylaxis. XARELTO for DVT prophylaxis.  Code Status: DNR Family Communication: Pt in room Disposition Plan: Home health when respiratory function further improves   Consultants:    Procedures:    Antibiotics: Anti-infectives    Start     Dose/Rate Route Frequency Ordered Stop   09/23/15 1800  fluconazole (DIFLUCAN) IVPB 100 mg     100 mg 50 mL/hr over 60 Minutes Intravenous Every 24 hours 09/22/15 1708     09/22/15 1800  fluconazole (DIFLUCAN) IVPB 200 mg     200 mg 100 mL/hr over 60 Minutes Intravenous  Once 09/22/15 1708 09/22/15 1909   09/22/15 1000  doxycycline (VIBRA-TABS) tablet 100 mg     100 mg Oral Every 12 hours 09/22/15 0840     09/21/15 2000  fluconazole (DIFLUCAN) tablet 150 mg     150 mg Oral  Once 09/21/15 1815 09/21/15 1951   09/20/15 0200  aztreonam (AZACTAM) 2 g in dextrose 5 % 50 mL IVPB  Status:   Discontinued     2 g 100 mL/hr over 30 Minutes Intravenous 3 times per day 10/13/2015 2259 09/23/15 0913   09/25/2015 2359  vancomycin (VANCOCIN) IVPB 750 mg/150 ml premix  Status:  Discontinued     750 mg 150 mL/hr over 60 Minutes Intravenous Every 12 hours 09/30/2015 2258 09/22/15 0840   10/13/2015 1545  cefTRIAXone (ROCEPHIN) 1 g in dextrose 5 % 50 mL IVPB     1 g 100 mL/hr over 30 Minutes Intravenous  Once 09/22/2015 1533 10/16/2015 1641   09/21/2015 1545  azithromycin (ZITHROMAX) 500 mg in dextrose 5 % 250 mL IVPB     500 mg 250 mL/hr over 60 Minutes Intravenous  Once 10/05/2015 1533 10/03/2015 1759      HPI/Subjective: Still complaining of mild sob related to increased secretions  Objective: Filed Vitals:   09/23/15 2106 09/24/15 0533 09/24/15 0807 09/24/15 0915  BP: 118/86 150/82  130/63  Pulse: 98 98  114  Temp: 98.2 F (36.8 C) 98.2 F (36.8 C)    TempSrc: Oral Oral    Resp: 20 20    Height:      Weight:      SpO2: 100% 100% 98%     Intake/Output Summary (Last 24 hours) at 09/24/15 1517 Last data filed at 09/24/15 0300  Gross per 24 hour  Intake     50 ml  Output   1000 ml  Net   -950 ml   Filed Weights   09/20/15 0100 09/22/15 0400 09/23/15 0500  Weight: 65.4 kg (144 lb 2.9  oz) 65.6 kg (144 lb 10 oz) 66.3 kg (146 lb 2.6 oz)    Exam:   General:  Awake, in nad  Cardiovascular: regular, s1, s2  Respiratory: coarse breath sounds, normal resp effort  Abdomen: soft, nondistended  Musculoskeletal: perfused, no clubbing   Data Reviewed: Basic Metabolic Panel:  Recent Labs Lab 09/20/15 0340 09/20/15 0830 09/21/15 0500 09/22/15 0340 09/23/15 0341 09/24/15 0425  NA 133*  --  136 135 136 137  K 3.8  --  3.7 3.6 4.2 3.8  CL 101  --  101 95* 97* 95*  CO2 26  --  29 30 32 35*  GLUCOSE 387*  --  283* 364* 332* 259*  BUN 9  --  '13 19 19 20  '$ CREATININE 0.50  --  0.51 0.54 0.47 0.55  CALCIUM 6.8* 7.0* 7.6* 7.9* 8.2* 8.3*  MG  --  1.5* 2.0 1.7 1.8 1.4*  PHOS  --  4.0   --   --   --   --    Liver Function Tests:  Recent Labs Lab 09/18/15 1029 10/13/2015 1535 09/21/15 0500 09/23/15 0341  AST '17 23 17 '$ 40  ALT '16 22 19 '$ 34  ALKPHOS 120 114 85 76  BILITOT 0.63 0.8 0.6 0.2*  PROT 6.6 6.9 5.5* 5.4*  ALBUMIN 3.0* 3.3* 2.5* 2.4*   No results for input(s): LIPASE, AMYLASE in the last 168 hours. No results for input(s): AMMONIA in the last 168 hours. CBC:  Recent Labs Lab 09/18/15 1029  10/10/2015 1535 09/20/15 0340 09/21/15 0500 09/22/15 0340 09/23/15 0341 09/24/15 0425  WBC 16.5*  < > 21.6* 13.3* 12.7* 11.0* 10.4 11.3*  NEUTROABS 13.7*  --  18.5*  --   --   --   --   --   HGB 11.8  < > 12.6 10.3* 9.4* 9.3* 9.0* 10.3*  HCT 38.1  < > 40.5 33.4* 30.6* 30.4* 29.8* 33.6*  MCV 78.1*  < > 80.5 81.9 80.1 81.7 80.8 82.0  PLT 258  < > 249 147* 135* 162 141* 146*  < > = values in this interval not displayed. Cardiac Enzymes:  Recent Labs Lab 09/20/15 0211 09/20/15 0830 09/20/15 1600  TROPONINI 0.07* 0.04* 0.04*   BNP (last 3 results)  Recent Labs  04/20/15 0340 08/08/15 0543 09/20/15 0500  BNP 32.5 37.2 220.0*    ProBNP (last 3 results) No results for input(s): PROBNP in the last 8760 hours.  CBG:  Recent Labs Lab 09/23/15 1246 09/23/15 1658 09/23/15 2046 09/24/15 0834 09/24/15 1148  GLUCAP 331* 312* 59* 214* 238*    Recent Results (from the past 240 hour(s))  Blood Culture (routine x 2)     Status: None   Collection Time: 10/07/2015  3:34 PM  Result Value Ref Range Status   Specimen Description BLOOD LEFT ARM  5 ML IN [facility] BOTTLE  Final   Special Requests NONE  Final   Culture   Final    NO GROWTH 5 DAYS Performed at [facility]    Report Status 09/24/2015 FINAL  Final  Blood Culture (routine x 2)     Status: None   Collection Time: 10/11/2015  3:43 PM  Result Value Ref Range Status   Specimen Description BLOOD RIGHT PORT  5 ML IN [facility] BOTTLE  Final   Special Requests NONE  Final   Culture   Final    NO GROWTH 5  DAYS Performed at [facility]    Report Status 09/24/2015  FINAL  Final  Urine culture     Status: None   Collection Time: 09/18/2015  9:34 PM  Result Value Ref Range Status   Specimen Description URINE, CLEAN CATCH  Final   Special Requests NONE  Final   Culture   Final    MULTIPLE SPECIES PRESENT, SUGGEST RECOLLECTION Performed at [facility]    Report Status 09/21/2015 FINAL  Final  MRSA PCR Screening     Status: None   Collection Time: 10/12/2015 11:42 PM  Result Value Ref Range Status   MRSA by PCR NEGATIVE NEGATIVE Final    Comment:        The GeneXpert MRSA Assay (FDA approved for NASAL specimens only), is one component of a comprehensive MRSA colonization surveillance program. It is not intended to diagnose MRSA infection nor to guide or monitor treatment for MRSA infections.   Culture, respiratory (NON-Expectorated)     Status: None   Collection Time: 09/20/15  8:00 AM  Result Value Ref Range Status   Specimen Description SPUTUM  Final   Special Requests NONE  Final   Gram Stain   Final    THIS SPECIMEN IS ACCEPTABLE FOR SPUTUM CULTURE MODERATE WBC PRESENT, PREDOMINANTLY PMN RARE SQUAMOUS EPITHELIAL CELLS PRESENT RARE GRAM POSITIVE COCCI IN PAIRS RARE GRAM POSITIVE RODS    Culture   Final    MODERATE YEAST CONSISTENT WITH CANDIDA SPECIES Performed at [facility]    Report Status 09/23/2015 FINAL  Final  Culture, expectorated sputum-assessment     Status: None   Collection Time: 09/20/15 10:04 AM  Result Value Ref Range Status   Specimen Description SPUTUM  Final   Special Requests Immunocompromised  Final   Sputum evaluation   Final    THIS SPECIMEN IS ACCEPTABLE. RESPIRATORY CULTURE REPORT TO FOLLOW.   Report Status 09/20/2015 FINAL  Final     Studies: Dg Chest Port 1 View  09/24/2015  CLINICAL DATA:  Weakness.  Community acquired pneumonia. EXAM: PORTABLE CHEST 1 VIEW COMPARISON:  September 21, 2015. FINDINGS: Normal cardiac  silhouette. Right internal jugular Port-A-Cath is unchanged with distal tip in expected position of cavoatrial junction. Stent is again noted in expected position of the SVC. No pneumothorax or significant pleural effusion is noted. Left lung is clear. Stable right perihilar and peritracheal opacity is noted concerning for adenopathy with associated atelectasis. Bony thorax is intact. IMPRESSION: Stable right perihilar and peritracheal opacity is noted most consistent with adenopathy and associated atelectasis. No significant changes noted compared to prior exam. Electronically Signed   By: Marijo Conception, M.D.   On: 09/24/2015 11:20    Scheduled Meds: . antiseptic oral rinse  7 mL Mouth Rinse BID  . arformoterol  15 mcg Nebulization BID  . aspirin  81 mg Oral Daily  . atorvastatin  20 mg Oral QHS  . budesonide (PULMICORT) nebulizer solution  0.25 mg Nebulization BID  . calcium citrate  200 mg of elemental calcium Oral QID  . cholecalciferol  1,000 Units Oral Daily  . cyanocobalamin  1,500 mcg Oral Daily  . docusate sodium  100 mg Oral BID  . doxycycline  100 mg Oral Q12H  . feeding supplement (GLUCERNA SHAKE)  237 mL Oral TID BM  . fluconazole (DIFLUCAN) IV  100 mg Intravenous Q24H  . fluticasone  2 spray Each Nare Daily  . guaiFENesin  1,200 mg Oral BID  . insulin aspart  0-20 Units Subcutaneous TID WC  . insulin aspart  0-5 Units  Subcutaneous QHS  . insulin aspart  8 Units Subcutaneous TID WC  . insulin glargine  28 Units Subcutaneous Daily  . ipratropium  0.5 mg Nebulization Q6H  . levalbuterol  1.25 mg Nebulization Q6H  . levothyroxine  137 mcg Oral Daily  . loratadine  10 mg Oral Daily  . magnesium oxide  400 mg Oral Daily  . metoprolol tartrate  25 mg Oral BID  . miconazole  1 Applicatorful Vaginal QHS  . montelukast  10 mg Oral QHS  . multivitamin with minerals  1 tablet Oral Daily  . omega-3 acid ethyl esters  1 g Oral BID  . pantoprazole  40 mg Oral Daily  . predniSONE  60  mg Oral QAC breakfast  . rivaroxaban  20 mg Oral Q supper  . sertraline  150 mg Oral QHS  . sodium chloride  3 mL Intravenous Q12H  . spironolactone  25 mg Oral Daily   Continuous Infusions:   Principal Problem:   COPD with acute exacerbation (HCC) Active Problems:   Primary cancer of right upper lobe of lung (HCC)   Hypothyroidism   DM type 2 (diabetes mellitus, type 2) (HCC)   HLD (hyperlipidemia)   Anxiety state   Sinus tachycardia (HCC)   Sepsis ([place])   DVT (deep venous thrombosis) (HCC)   Unilateral vocal cord paralysis   Long term current use of anticoagulant therapy   Acute on chronic respiratory failure (HCC)   Depression   PE (physical exam), annual   Skin infection   Atrial fibrillation with RVR ([place])   Hypocalcemia   Malignant neoplasm of upper lobe of lung ([place])   DM type 2, goal A1c below 7   Oropharyngeal candidiasis   [facility], [facility] Pager [phone]. If 7PM-7AM, please contact night-coverage at [email or web address], password [facility] 09/24/2015, 3:17 PM  LOS: 5 days

## 2015-09-24 NOTE — Progress Notes (Addendum)
Occupational Therapy Treatment Patient Details Name: Janice Ball MRN: [id number] DOB: October 02, 1956 Today's Date: 09/24/2015    History of present illness Pt admitted with COPD exacerbation and hx of lung CA, PE and DM, Vocal cord paralysis   OT comments  Patient making slow, steady progress towards OT goals. Continue plan of care for now. Pt is limited by poor overall activity tolerance/endurance. Educated patient on energy conservation techniques including pursed lip breathing & seated rest breaks, handout administered.  Patient's 02 sats WNL on 2L/min supplemental 02 AND HR WNL.   Follow Up Recommendations  Home health OT;Supervision/Assistance - 24 hour    Equipment Recommendations  None recommended by OT    Recommendations for Other Services  None at this time   Precautions / Restrictions Precautions Precautions: Fall Restrictions Weight Bearing Restrictions: No    Mobility Bed Mobility Overal bed mobility: Needs Assistance Bed Mobility: Sit to Supine       Sit to supine: Supervision   General bed mobility comments: for safety  Transfers Overall transfer level: Needs assistance Equipment used: None Transfers: Sit to/from Stand Sit to Stand: Min guard   Balance Overall balance assessment: Needs assistance Sitting-balance support: No upper extremity supported;Feet supported Sitting balance-Leahy Scale: Fair     Standing balance support: No upper extremity supported Standing balance-Leahy Scale: Fair   ADL Overall ADL's : Needs assistance/impaired Eating/Feeding: Sitting;Set up   Grooming: Set up;Sitting   Upper Body Bathing: Minimal assitance;Sitting   Lower Body Bathing: Min guard;Sit to/from stand   Upper Body Dressing : Set up;Sitting   Lower Body Dressing: Min guard;Sit to/from stand General ADL Comments: Pt found seated EOB. Pt with decreased endurance which limited her ability to participate in this OT session. Pt able to cross BLEs for LB ADLs.  Therapist encouraged pt to engage in some therapeutic exercise of sit to/from stands. Original goal was for patient to perform X10, but patient unable and only able to perform X5 from EOB. From here patient very eager to lay down. Therapist administerred energy conservation educational handout and gave to patient. Went over highlights and encouraged patient to read through the handout.      Cognition   Behavior During Therapy: WFL for tasks assessed/performed Overall Cognitive Status: Within Functional Limits for tasks assessed                 Pertinent Vitals/ Pain       Pain Assessment: No/denies pain   Frequency Min 2X/week     Progress Toward Goals  OT Goals(current goals can now befound in the care plan section)  Progress towards OT goals: Progressing toward goals     Plan Discharge plan remains appropriate    End of Session Equipment Utilized During Treatment: Oxygen   Activity Tolerance Patient limited by fatigue   Patient Left in bed;with call bell/phone within reach    Time: 1425-1444 OT Time Calculation (min): 19 min  Charges: OT General Charges $OT Visit: 1 Procedure OT Treatments $Therapeutic Exercise: 8-22 mins  Verina Galeno , MS, OTR/L, CLT Pager: [phone]  09/24/2015, 2:56 PM

## 2015-09-25 ENCOUNTER — Other Ambulatory Visit: Payer: Medicare Other

## 2015-09-25 ENCOUNTER — Ambulatory Visit: Payer: Medicare Other

## 2015-09-25 DIAGNOSIS — F411 Generalized anxiety disorder: Secondary | ICD-10-CM

## 2015-09-25 LAB — CBC
HEMATOCRIT: 32 % — AB (ref 36.0–46.0)
Hemoglobin: 9.7 g/dL — ABNORMAL LOW (ref 12.0–15.0)
MCH: 24.4 pg — AB (ref 26.0–34.0)
MCHC: 30.3 g/dL (ref 30.0–36.0)
MCV: 80.6 fL (ref 78.0–100.0)
Platelets: 150 10*3/uL (ref 150–400)
RBC: 3.97 MIL/uL (ref 3.87–5.11)
RDW: 16.7 % — ABNORMAL HIGH (ref 11.5–15.5)
WBC: 10.9 10*3/uL — AB (ref 4.0–10.5)

## 2015-09-25 LAB — BASIC METABOLIC PANEL
ANION GAP: 7 (ref 5–15)
BUN: 21 mg/dL — ABNORMAL HIGH (ref 6–20)
CHLORIDE: 95 mmol/L — AB (ref 101–111)
CO2: 33 mmol/L — AB (ref 22–32)
Calcium: 8.2 mg/dL — ABNORMAL LOW (ref 8.9–10.3)
Creatinine, Ser: 0.48 mg/dL (ref 0.44–1.00)
GFR calc non Af Amer: >60 mL/min (ref >60)
GLUCOSE: 358 mg/dL — AB (ref 65–99)
Potassium: 4 mmol/L (ref 3.5–5.1)
Sodium: 135 mmol/L (ref 135–145)

## 2015-09-25 LAB — GLUCOSE, CAPILLARY
GLUCOSE-CAPILLARY: 289 mg/dL — AB (ref 65–99)
GLUCOSE-CAPILLARY: 373 mg/dL — AB (ref 65–99)
Glucose-Capillary: 308 mg/dL — ABNORMAL HIGH (ref 65–99)
Glucose-Capillary: 255 mg/dL — ABNORMAL HIGH (ref 65–99)

## 2015-09-25 LAB — MAGNESIUM: Magnesium: 1.7 mg/dL (ref 1.7–2.4)

## 2015-09-25 MED ORDER — INSULIN GLARGINE 100 UNIT/ML ~~LOC~~ SOLN
32.0000 [IU] | Freq: Every day | SUBCUTANEOUS | Status: DC
  Administered 2015-09-25 – 2015-09-28 (×4): 32 [IU] via SUBCUTANEOUS
  Filled 2015-09-25 (×4): qty 0.32

## 2015-09-25 NOTE — Progress Notes (Signed)
Physical Therapy Treatment Patient Details Name: Janice Ball MRN: [id number] DOB: July 13, 1956 Today's Date: 09/25/2015    History of Present Illness Pt admitted with COPD exacerbation and hx of lung CA, PE and DM, Vocal cord paralysis    PT Comments    Pt in bed on 2 lts sats 97%.  Assisted to [facility] for void/BM.  Assisted with amb in hallway a greater distance on RA.  Lowest recoreded was 87% however avg 89%.  Max c/o weakness/fatigue.  "I get tired just eating", stated pt.  Returned to room then assisted back to bed per pt request.  reapplied nasal at 1 lt.   Follow Up Recommendations  Home health PT;Supervision - Intermittent     Equipment Recommendations  None recommended by PT    Recommendations for Other Services       Precautions / Restrictions Precautions Precautions: Fall Precaution Comments: monitor O2 Restrictions Weight Bearing Restrictions: No    Mobility  Bed Mobility Overal bed mobility: Needs Assistance Bed Mobility: Supine to Sit;Sit to Supine     Supine to sit: Supervision;Min guard Sit to supine: Supervision;Min guard   General bed mobility comments: increased time  Transfers Overall transfer level: Needs assistance Equipment used: None Transfers: Sit to/from Stand Sit to Stand: Supervision;Min guard         General transfer comment: 255 VC's on safety with turns.  Assisted from bed to [facility] then back to bed.  Ambulation/Gait Ambulation/Gait assistance: Min assist Ambulation Distance (Feet): 110 Feet Assistive device: Rolling walker (2 wheeled)   Gait velocity: decreased   General Gait Details: amb with RW on RA lowest sat 87% but more avg 89% with HR 109.  Max c/o fatigue/weakness.  "I get tired just eating".  2nd assist following with recliner for safety.     Stairs            Wheelchair Mobility    Modified Rankin (Stroke Patients Only)       Balance                                    Cognition  Arousal/Alertness: Awake/alert Behavior During Therapy: WFL for tasks assessed/performed Overall Cognitive Status: Within Functional Limits for tasks assessed                      Exercises      General Comments        Pertinent Vitals/Pain Pain Assessment: 0-10 Pain Score: 5  Pain Location: mid chest/deep chest (coughing) Pain Descriptors / Indicators: Discomfort Pain Intervention(s): Monitored during session;Patient requesting pain meds-RN notified    Home Living                      Prior Function            PT Goals (current goals can now be found in the care plan section) Progress towards PT goals: Progressing toward goals    Frequency  Min 3X/week    PT Plan Current plan remains appropriate    Co-evaluation             End of Session Equipment Utilized During Treatment: Gait belt Activity Tolerance: Patient limited by fatigue Patient left: in bed;with call bell/phone within reach;with bed alarm set     Time: 1040-1105 PT Time Calculation (min) (ACUTE ONLY): 25 min  Charges:  $Gait Training: 8-22 mins $Therapeutic Activity:  8-22 mins                    G Codes:      Rica Koyanagi  PTA [facility]  Acute  Rehab Pager      [phone]

## 2015-09-25 NOTE — Progress Notes (Signed)
Inpatient Diabetes Program Recommendations  AACE/ADA: New Consensus Statement on Inpatient Glycemic Control (2015)  Target Ranges:  Prepandial:   less than 140 mg/dL      Peak postprandial:   less than 180 mg/dL (1-2 hours)      Critically ill patients:  140 - 180 mg/dL   Results for CARMELINE, KOWAL (MRN [id number]) as of 09/25/2015 12:43  Ref. Range 09/24/2015 08:34 09/24/2015 11:48 09/24/2015 17:29 09/24/2015 23:02  Glucose-Capillary Latest Ref Range: 65-99 mg/dL 214 (H) 238 (H) 81 353 (H)    Results for KENDYL, FESTA (MRN [id number]) as of 09/25/2015 12:43  Ref. Range 09/25/2015 07:43 09/25/2015 11:59  Glucose-Capillary Latest Ref Range: 65-99 mg/dL 308 (H) 289 (H)     Home DM Meds: Amaryl 2 mg daily  Metformin 1000 mg bid  Current Insulin Orders: Lantus 32 units daily  Novolog Resistant SSI (0-20 units) TID AC + HS  Novolog 8 units tidwc     -Note patient now getting Prednisone 60 mg daily. Last dose IV Solumedrol given 12/06 at 5am.  -Was hopeful that patient's glucose levels would decrease now that IV steroids stopped.  However, patient's CBGs remain >200 mg/dl.  -Note Lantus increased to 32 units daily today.    MD- Please consider increasing Novolog Meal Coverage to 10 units tidwc while patient remains on Prednisone      --Will follow patient during hospitalization--  Wyn Quaker RN, MSN, CDE Diabetes Coordinator Inpatient Glycemic Control Team Team Pager: [phone] (8a-5p)

## 2015-09-25 NOTE — Progress Notes (Signed)
Nutrition Follow-up  DOCUMENTATION CODES:   Not applicable  INTERVENTION:  - Continue Glucerna Shake TID - RD will continue to monitor for needs  NUTRITION DIAGNOSIS:   Unintentional weight loss related to chronic illness as evidenced by percent weight loss.  GOAL:   Patient will meet greater than or equal to 90% of their needs  MONITOR:   PO intake, Supplement acceptance, Labs, Weight trends, Skin, I & O's  ASSESSMENT:   59 y.o. female with PMH of recurrent non-small cell lung cancer (on on chemotherapy and s/p of radiation, follows with Dr. [name]), SVC syndrome, PE and DVT on Xarelto, COPD, type 2 diabetes mellitus, hypothyroidism and anxiety, and left vocal cord paralysis with weak breathing for which she underwent left medialization thyroplasty on 10/4 by Dr. [name] (ENT), who presents with productive cough, SOB and the skin infection.  12/8 Per chart review, pt ate 100% of breakfast 12/6 with no other intakes documented since previous assessment. Pt states that for breakfast this AM she ate grits, bacon, yogurt, and fruit and ate the majority of each item. She denies nausea or abdominal pain this AM or after meals previously. She does not particularly like the flavor of Glucerna Shake but states that she still makes an effort to drink it TID as she knows that adequate nutrition is important for her. Pt states that overall her appetite is slowly improving.  Notes indicate pt with oral pharyngeal candidiasis.   Meeting needs on average. Medications reviewed. Labs reviewed; CBGs: 59-395 mg/dL, Cl: 95 mmol/L, BUN elevated, Ca: 8.2 mg/dL.     12/4 - Patient reports eating 100% of her breakfast this morning, she had a BLT and fruit.  - Pt reports good appetite but she has had unexplained weight loss, ~40 lb this past year.  - UBW is around 200 lb. Pt has lost 21 lb since 10/31 (13% weight loss x >1 month, significant for time frame). - Patient reports a home health nurse  encouraged her to drink supplements - RD to order Glucerna shakes TID as pt's CBGs are in the 200-300s. - Nutrition focused physical exam shows no sign of depletion of muscle mass or body fat.  Diet Order:  Diet Carb Modified Fluid consistency:: Thin; Room service appropriate?: Yes  Skin:  Reviewed, no issues  Last BM:  12/7  Height:   Ht Readings from Last 1 Encounters:  09/20/15 '5\' 7"'$  (1.702 m)    Weight:   Wt Readings from Last 1 Encounters:  09/23/15 146 lb 2.6 oz (66.3 kg)    Ideal Body Weight:  61.4 kg  BMI:  Body mass index is 22.89 kg/(m^2).  Estimated Nutritional Needs:   Kcal:  1900-2100  Protein:  90-100g  Fluid:  2L/day  EDUCATION NEEDS:   No education needs identified at this time     Jarome Matin, RD, LDN Inpatient Clinical Dietitian Pager # [phone] After hours/weekend pager # [phone]

## 2015-09-25 NOTE — Progress Notes (Signed)
[facility] PROGRESS NOTE  Janice Ball EQA:[id number] DOB: 1956-09-11 DOA: 10/12/2015 PCP: [name], NP  HPI/Brief narrative 59 year old female history of recurrent non-small cell lung cancer on chemotherapy status post radiation been followed by Dr. 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 [name] (ENT) who presented with a productive cough shortness of breath and a skin infection. On admission patient none to have a transient A. fib and went back into her chronic sinus tachycardia. Patient was admitted to the step down unit and being treated for an acute COPD exacerbation and noted to be in sepsis with a skin infection also noted. Patient noted to have significant airway noise. Patient was placed on IV steroids, scheduled nebulizers, empiric IV antibiotics of vancomycin and aztreonam. Patient was pancultured and cultures pending. Sputum Gram stain and culture with moderate [place] consistent with Candida species. Patient placed on Diflucan. IV antibiotics, transitioned to oral antibiotics and IV steroids have been transitioned to oral prednisone. Patient is slowly improving.  Assessment/Plan: #1 acute COPD exacerbation  Chest x-ray negative for any acute infiltrate. Patient remains congested with upper airway noise. Patient with less diffuse wheezing and some coarse breath sounds. Influenza PCR negative. Continue Atrovent, Xopenex scheduled nebulizers, Mucinex, change IV steroids to oral prednisone and taper. Continue Pulmicort and Brovana, Claritin, Flonase. NT suction as needed. Patient reports some improvement with flutter valve.  -Pt reports continued improvement with chest PT, will continue -Follow up CXR neg for acute process  #2 sepsis Patient initially noted to have a leukocytosis with a white count of 21.6, tachycardic, with  a elevated lactate level. May be secondary to skin infection and COPD exacerbation. Blood cultures and urine cx unremarkable. Sputum Gram stain and culture with moderate yeast consistent with Candida species. Repeat lactate level improved at 1.1. WBC trending down since admission. Continue IV fluids, treatment for COPD exacerbation. DC'd IV vancomycin. D/C IV aztreonam. For now, continue oral doxycycline for skin infection. Continue Diflucan.  #3 oral pharyngeal candidiasis Sputum Gram stain and culture with moderate ease consistent with Candida species. Patient with an acute COPD exacerbation on steroids. Patient with a history of recurrent non-small cell lung cancer on chemotherapy and also with a vaginal candidiasis. Patient is continued on Diflucan with plans to complete total of 2 weeks of tx  #4 leukocytosis Leukocytosis trending down. Patient has been pancultured. Cultures unremarkable thus far. Continue empiric antibiotics.  #5 skin infection over right lateral forearm Bandage around forearm. D/C IV aztreonam. Continue on oral doxycycline. Wound care consult.  #6 diabetes mellitus type 2 Hemoglobin A1c was 8.2 on 08/16/2015. Patient CBG on admission was 553. Patient is continued on Lantus 28 units daily, meal coverage insulin 8 units NovoLog, resistant sliding scale insulin. IV steroids have been transitioned to oral steroids will need to follow CBGs closely. Glucose remains in the 200-300's. Have increased lantus to 32 units. If still poor control, then would increase pre-meal insulin to 10 units  #7 hypocalcemia Continue calcium citrate 500 mg 4 times a day with water and vitamin D 1000 international units by mouth daily with water.   #8 sinus tachycardia Likely secondary to problem #1 and 2. Tachycardia improved however still tachycardic and seems chronic in review of patient's epic. Continued on Lopressor to 25 mg by mouth twice a day. Follow.  #9 history of PE and extensive DVT of  left upper  extremity status post thrombectomy Continue XARELTO as tolerated  #10 hypothyroidism TSH was 1.5 on 08/16/2015. Continue Synthroid.  #11 hyperlipidemia LDL was 95 On 04/21/2015 Continue Lipitor.  #12 depression/anxiety Stable. No homicidal or suicidal ideations. Continue Zoloft and Ativan.  #13 paroxysmal/Transient new onset A. fib with RVR >>>>>SINUS TACHYCARDIA Likely secondary to COPD exacerbation. Patient, back in sinus tachycardia which is chronic. CHA2DS2Vasc score is 3. Patient on anticoagulation. First set of troponin slightly elevated at 0.7. Serial enzymes trended down. Patient with recent 2-D echo on 08/17/2015 with a EF of 65-70%. No wall motion abnormalities. Patient off Cardizem drip. patient's heart rate is still elevated and in looking back at her records has been elevated in the 120s to 130s chronically. TSH at 1.500 on 08/16/2015 . Increase Lopressor to 25 mg twice a day and titrate as needed.   #14 recurrent non-small cell lung cancer patient on concurrent chemoradiation. Outpatient follow-up. Informed oncology of patient's admission via Epic.Dr Earlie Server came by social visit.  #15 vaginal candidiasis Status post Diflucan 150 mg 1, now continued on diflucan per above  #16 prophylaxis PPI for GI prophylaxis. XARELTO for DVT prophylaxis.  Code Status: DNR Family Communication: Pt in room Disposition Plan: Home health when respiratory function further improves   Consultants:    Procedures:    Antibiotics: Anti-infectives    Start     Dose/Rate Route Frequency Ordered Stop   09/23/15 1800  fluconazole (DIFLUCAN) IVPB 100 mg     100 mg 50 mL/hr over 60 Minutes Intravenous Every 24 hours 09/22/15 1708     09/22/15 1800  fluconazole (DIFLUCAN) IVPB 200 mg     200 mg 100 mL/hr over 60 Minutes Intravenous  Once 09/22/15 1708 09/22/15 1909   09/22/15 1000  doxycycline (VIBRA-TABS) tablet 100 mg     100 mg Oral Every 12 hours 09/22/15 0840      09/21/15 2000  fluconazole (DIFLUCAN) tablet 150 mg     150 mg Oral  Once 09/21/15 1815 09/21/15 1951   09/20/15 0200  aztreonam (AZACTAM) 2 g in dextrose 5 % 50 mL IVPB  Status:  Discontinued     2 g 100 mL/hr over 30 Minutes Intravenous 3 times per day 10/13/2015 2259 09/23/15 0913   09/29/2015 2359  vancomycin (VANCOCIN) IVPB 750 mg/150 ml premix  Status:  Discontinued     750 mg 150 mL/hr over 60 Minutes Intravenous Every 12 hours 10/14/2015 2258 09/22/15 0840   10/11/2015 1545  cefTRIAXone (ROCEPHIN) 1 g in dextrose 5 % 50 mL IVPB     1 g 100 mL/hr over 30 Minutes Intravenous  Once 09/27/2015 1533 09/22/2015 1641   09/30/2015 1545  azithromycin (ZITHROMAX) 500 mg in dextrose 5 % 250 mL IVPB     500 mg 250 mL/hr over 60 Minutes Intravenous  Once 09/18/2015 1533 09/24/2015 1759      HPI/Subjective: Reports some relief after chest PT, but states she is still far from baseline  Objective: Filed Vitals:   09/25/15 0511 09/25/15 0932 09/25/15 1003 09/25/15 1400  BP: 133/75  115/71 104/78  Pulse: 101  107 90  Temp: 97.8 F (36.6 C)   98 F (36.7 C)  TempSrc: Oral   Oral  Resp: 18   20  Height:      Weight:      SpO2: 100% 100%  100%    Intake/Output Summary (Last 24 hours) at 09/25/15 1608 Last data filed at 09/25/15 1050  Gross per 24 hour  Intake  0 ml  Output    301 ml  Net   -301 ml   Filed Weights   09/20/15 0100 09/22/15 0400 09/23/15 0500  Weight: 65.4 kg (144 lb 2.9 oz) 65.6 kg (144 lb 10 oz) 66.3 kg (146 lb 2.6 oz)    Exam:   General:  Awake, laying in bed, in nad  Cardiovascular: regular, s1, s2  Respiratory: hoarse voice, coarse breath sounds, normal resp effort  Abdomen: soft, nondistended  Musculoskeletal: perfused, no clubbing, no cyanosis  Data Reviewed: Basic Metabolic Panel:  Recent Labs Lab 09/20/15 0830 09/21/15 0500 09/22/15 0340 09/23/15 0341 09/24/15 0425 09/25/15 0305  NA  --  136 135 136 137 135  K  --  3.7 3.6 4.2 3.8 4.0  CL  --  101  95* 97* 95* 95*  CO2  --  29 30 32 35* 33*  GLUCOSE  --  283* 364* 332* 259* 358*  BUN  --  '13 19 19 20 '$ 21*  CREATININE  --  0.51 0.54 0.47 0.55 0.48  CALCIUM 7.0* 7.6* 7.9* 8.2* 8.3* 8.2*  MG 1.5* 2.0 1.7 1.8 1.4* 1.7  PHOS 4.0  --   --   --   --   --    Liver Function Tests:  Recent Labs Lab 09/29/2015 1535 09/21/15 0500 09/23/15 0341  AST 23 17 40  ALT 22 19 34  ALKPHOS 114 85 76  BILITOT 0.8 0.6 0.2*  PROT 6.9 5.5* 5.4*  ALBUMIN 3.3* 2.5* 2.4*   No results for input(s): LIPASE, AMYLASE in the last 168 hours. No results for input(s): AMMONIA in the last 168 hours. CBC:  Recent Labs Lab 09/23/2015 1535  09/21/15 0500 09/22/15 0340 09/23/15 0341 09/24/15 0425 09/25/15 0305  WBC 21.6*  < > 12.7* 11.0* 10.4 11.3* 10.9*  NEUTROABS 18.5*  --   --   --   --   --   --   HGB 12.6  < > 9.4* 9.3* 9.0* 10.3* 9.7*  HCT 40.5  < > 30.6* 30.4* 29.8* 33.6* 32.0*  MCV 80.5  < > 80.1 81.7 80.8 82.0 80.6  PLT 249  < > 135* 162 141* 146* 150  < > = values in this interval not displayed. Cardiac Enzymes:  Recent Labs Lab 09/20/15 0211 09/20/15 0830 09/20/15 1600  TROPONINI 0.07* 0.04* 0.04*   BNP (last 3 results)  Recent Labs  04/20/15 0340 08/08/15 0543 09/20/15 0500  BNP 32.5 37.2 220.0*    ProBNP (last 3 results) No results for input(s): PROBNP in the last 8760 hours.  CBG:  Recent Labs Lab 09/24/15 1148 09/24/15 1729 09/24/15 2302 09/25/15 0743 09/25/15 1159  GLUCAP 238* 81 353* 308* 289*    Recent Results (from the past 240 hour(s))  Blood Culture (routine x 2)     Status: None   Collection Time: 10/10/2015  3:34 PM  Result Value Ref Range Status   Specimen Description BLOOD LEFT ARM  5 ML IN [facility] BOTTLE  Final   Special Requests NONE  Final   Culture   Final    NO GROWTH 5 DAYS Performed at [facility]    Report Status 09/24/2015 FINAL  Final  Blood Culture (routine x 2)     Status: None   Collection Time: 09/28/2015  3:43 PM  Result Value  Ref Range Status   Specimen Description BLOOD RIGHT PORT  5 ML IN [facility] BOTTLE  Final   Special Requests NONE  Final  Culture   Final    NO GROWTH 5 DAYS Performed at [facility]    Report Status 09/24/2015 FINAL  Final  Urine culture     Status: None   Collection Time: 10/15/2015  9:34 PM  Result Value Ref Range Status   Specimen Description URINE, CLEAN CATCH  Final   Special Requests NONE  Final   Culture   Final    MULTIPLE SPECIES PRESENT, SUGGEST RECOLLECTION Performed at [facility]    Report Status 09/21/2015 FINAL  Final  MRSA PCR Screening     Status: None   Collection Time: 09/18/2015 11:42 PM  Result Value Ref Range Status   MRSA by PCR NEGATIVE NEGATIVE Final    Comment:        The GeneXpert MRSA Assay (FDA approved for NASAL specimens only), is one component of a comprehensive MRSA colonization surveillance program. It is not intended to diagnose MRSA infection nor to guide or monitor treatment for MRSA infections.   Culture, respiratory (NON-Expectorated)     Status: None   Collection Time: 09/20/15  8:00 AM  Result Value Ref Range Status   Specimen Description SPUTUM  Final   Special Requests NONE  Final   Gram Stain   Final    THIS SPECIMEN IS ACCEPTABLE FOR SPUTUM CULTURE MODERATE WBC PRESENT, PREDOMINANTLY PMN RARE SQUAMOUS EPITHELIAL CELLS PRESENT RARE GRAM POSITIVE COCCI IN PAIRS RARE GRAM POSITIVE RODS    Culture   Final    MODERATE YEAST CONSISTENT WITH CANDIDA SPECIES Performed at [facility]    Report Status 09/23/2015 FINAL  Final  Culture, expectorated sputum-assessment     Status: None   Collection Time: 09/20/15 10:04 AM  Result Value Ref Range Status   Specimen Description SPUTUM  Final   Special Requests Immunocompromised  Final   Sputum evaluation   Final    THIS SPECIMEN IS ACCEPTABLE. RESPIRATORY CULTURE REPORT TO FOLLOW.   Report Status 09/20/2015 FINAL  Final     Studies: Dg Chest Port 1  View  09/24/2015  CLINICAL DATA:  Weakness.  Community acquired pneumonia. EXAM: PORTABLE CHEST 1 VIEW COMPARISON:  September 21, 2015. FINDINGS: Normal cardiac silhouette. Right internal jugular Port-A-Cath is unchanged with distal tip in expected position of cavoatrial junction. Stent is again noted in expected position of the SVC. No pneumothorax or significant pleural effusion is noted. Left lung is clear. Stable right perihilar and peritracheal opacity is noted concerning for adenopathy with associated atelectasis. Bony thorax is intact. IMPRESSION: Stable right perihilar and peritracheal opacity is noted most consistent with adenopathy and associated atelectasis. No significant changes noted compared to prior exam. Electronically Signed   By: Marijo Conception, M.D.   On: 09/24/2015 11:20    Scheduled Meds: . antiseptic oral rinse  7 mL Mouth Rinse BID  . arformoterol  15 mcg Nebulization BID  . aspirin  81 mg Oral Daily  . atorvastatin  20 mg Oral QHS  . budesonide (PULMICORT) nebulizer solution  0.25 mg Nebulization BID  . calcium citrate  200 mg of elemental calcium Oral QID  . cholecalciferol  1,000 Units Oral Daily  . cyanocobalamin  1,500 mcg Oral Daily  . docusate sodium  100 mg Oral BID  . doxycycline  100 mg Oral Q12H  . feeding supplement (GLUCERNA SHAKE)  237 mL Oral TID BM  . fluconazole (DIFLUCAN) IV  100 mg Intravenous Q24H  . fluticasone  2 spray Each Nare Daily  . guaiFENesin  1,200 mg Oral BID  . insulin aspart  0-20 Units Subcutaneous TID WC  . insulin aspart  0-5 Units Subcutaneous QHS  . insulin aspart  8 Units Subcutaneous TID WC  . insulin glargine  32 Units Subcutaneous Daily  . ipratropium  0.5 mg Nebulization Q6H  . levalbuterol  1.25 mg Nebulization Q6H  . levothyroxine  137 mcg Oral Daily  . loratadine  10 mg Oral Daily  . magnesium oxide  400 mg Oral Daily  . metoprolol tartrate  25 mg Oral BID  . miconazole  1 Applicatorful Vaginal QHS  . montelukast  10  mg Oral QHS  . multivitamin with minerals  1 tablet Oral Daily  . omega-3 acid ethyl esters  1 g Oral BID  . pantoprazole  40 mg Oral Daily  . predniSONE  60 mg Oral QAC breakfast  . rivaroxaban  20 mg Oral Q supper  . sertraline  150 mg Oral QHS  . sodium chloride  10-40 mL Intracatheter Q12H  . sodium chloride  3 mL Intravenous Q12H  . spironolactone  25 mg Oral Daily   Continuous Infusions:   Principal Problem:   COPD with acute exacerbation (HCC) Active Problems:   Primary cancer of right upper lobe of lung (HCC)   Hypothyroidism   DM type 2 (diabetes mellitus, type 2) (HCC)   HLD (hyperlipidemia)   Anxiety state   Sinus tachycardia ([place])   Sepsis ([place])   DVT (deep venous thrombosis) (HCC)   Unilateral vocal cord paralysis   Long term current use of anticoagulant therapy   Acute on chronic respiratory failure (HCC)   Depression   PE (physical exam), annual   Skin infection   Atrial fibrillation with RVR ([place])   Hypocalcemia   Malignant neoplasm of upper lobe of lung ([place])   DM type 2, goal A1c below 7   Oropharyngeal candidiasis   [facility], [place] [facility] Pager [phone]. If 7PM-7AM, please contact night-coverage at [email or web address], password [facility] 09/25/2015, 4:08 PM  LOS: 6 days

## 2015-09-25 NOTE — Progress Notes (Signed)
Occupational Therapy Treatment Patient Details Name: CONNY SITU MRN: [id number] DOB: 1956-01-11 Today's Date: 09/25/2015    History of present illness Pt admitted with COPD exacerbation and hx of lung CA, PE and DM, Vocal cord paralysis   OT comments  Pt progressing; encouraged rest breaks  Follow Up Recommendations  Home health OT;Supervision/Assistance - 24 hour    Equipment Recommendations  None recommended by OT    Recommendations for Other Services      Precautions / Restrictions Precautions Precautions: Fall Precaution Comments: monitor O2 Restrictions Weight Bearing Restrictions: No       Mobility Bed Mobility Overal bed mobility: Needs Assistance Bed Mobility: Supine to Sit;Sit to Supine     Supine to sit: Supervision Sit to supine: Supervision   General bed mobility comments: HOB was raised; mod I, increased time  Transfers Overall transfer level: Needs assistance Equipment used: None Transfers: Sit to/from Stand Sit to Stand: Supervision             Balance                                   ADL       Grooming: Oral care;Supervision/safety;Standing                                 General ADL Comments: ambulated to bathroom with min guard using RW and stood at sink with supervision/set up for oral care.  sats 90-91%.  tolerated well.  Pt donned and doffed socks from bed level with set up.  Discussed tub at home. She has a seat in the tub and she is able to sit on it and bring legs over tub ledge  Encouraged rest breaks; pursed lip breathing      Vision                     Perception     Praxis      Cognition   Behavior During Therapy: [facility] for tasks assessed/performed Overall Cognitive Status: Within Functional Limits for tasks assessed                       Extremity/Trunk Assessment               Exercises     Shoulder Instructions       General Comments       Pertinent Vitals/ Pain       Pain Assessment: No/denies pain Pain Score: 5  Pain Location: mid chest/deep chest (coughing) Pain Descriptors / Indicators: Discomfort Pain Intervention(s): Monitored during session;Patient requesting pain meds-RN notified  Home Living                                          Prior Functioning/Environment              Frequency Min 2X/week     Progress Toward Goals  OT Goals(current goals can now be found in the care plan section)  Progress towards OT goals: Progressing toward goals     Plan      Co-evaluation                 End of Session Equipment Utilized During Treatment: Oxygen  Activity Tolerance Patient limited by fatigue   Patient Left in bed;with call bell/phone within reach   Nurse Communication          Time: 8811-0315 OT Time Calculation (min): 17 min  Charges: OT General Charges $OT Visit: 1 Procedure OT Treatments $Self Care/Home Management : 8-22 mins  Obi Scrima 09/25/2015, 3:18 PM   Lesle Chris, OTR/L [phone] 09/25/2015

## 2015-09-26 ENCOUNTER — Inpatient Hospital Stay (HOSPITAL_COMMUNITY): Payer: Medicare Other

## 2015-09-26 DIAGNOSIS — E119 Type 2 diabetes mellitus without complications: Secondary | ICD-10-CM

## 2015-09-26 LAB — GLUCOSE, CAPILLARY
GLUCOSE-CAPILLARY: 300 mg/dL — AB (ref 65–99)
GLUCOSE-CAPILLARY: 244 mg/dL — AB (ref 65–99)
Glucose-Capillary: 163 mg/dL — ABNORMAL HIGH (ref 65–99)
Glucose-Capillary: 307 mg/dL — ABNORMAL HIGH (ref 65–99)

## 2015-09-26 MED ORDER — FUROSEMIDE 10 MG/ML IJ SOLN
40.0000 mg | Freq: Once | INTRAMUSCULAR | Status: AC
  Administered 2015-09-26: 40 mg via INTRAVENOUS
  Filled 2015-09-26: qty 4

## 2015-09-26 MED ORDER — IOHEXOL 300 MG/ML  SOLN
75.0000 mL | Freq: Once | INTRAMUSCULAR | Status: AC | PRN
  Administered 2015-09-26: 75 mL via INTRAVENOUS

## 2015-09-26 MED ORDER — SCOPOLAMINE 1 MG/3DAYS TD PT72
1.0000 | MEDICATED_PATCH | TRANSDERMAL | Status: DC
  Administered 2015-09-26: 1.5 mg via TRANSDERMAL
  Filled 2015-09-26: qty 1

## 2015-09-26 NOTE — Progress Notes (Signed)
Patient continues with marked secretions without improvement despite mucinex, flutter valve, chest PT, or NT suctioning. Discussed case with Pulmonary who recommends chest CT, starting scopolamine patch, and a trial of lasix. If still no improvement by tomorrow, then plan formal Pulmonary consultation.

## 2015-09-26 NOTE — Progress Notes (Signed)
Patient refusing treatment at this time. Will attempt again at next scheduled time.

## 2015-09-26 NOTE — Progress Notes (Addendum)
Pt refused 04:00 chest physiotherapy.  Pt states that she is in too much pain to tolerate the vest at this time.  RT will continue to monitor as needed.

## 2015-09-26 NOTE — Progress Notes (Addendum)
Inpatient Diabetes Program Recommendations  AACE/ADA: New Consensus Statement on Inpatient Glycemic Control (2015)  Target Ranges:  Prepandial:   less than 140 mg/dL      Peak postprandial:   less than 180 mg/dL (1-2 hours)      Critically ill patients:  140 - 180 mg/dL    Results for Janice Ball, Janice Ball (MRN [id number]) as of 09/26/2015 10:19  Ref. Range 09/25/2015 07:43 09/25/2015 11:59 09/25/2015 16:55 09/25/2015 21:00  Glucose-Capillary Latest Ref Range: 65-99 mg/dL 308 (H) 289 (H) 373 (H) 255 (H)    Results for Janice Ball, Janice Ball (MRN [id number]) as of 09/26/2015 10:19  Ref. Range 09/26/2015 07:35  Glucose-Capillary Latest Ref Range: 65-99 mg/dL 163 (H)    Home DM Meds: Amaryl 2 mg daily  Metformin 1000 mg bid  Current Insulin Orders: Lantus 32 units daily  Novolog Resistant SSI (0-20 units) TID AC + HS  Novolog 8 units tidwc      -Note patient now getting Prednisone 60 mg daily. Last dose IV Solumedrol given 12/06 at 5am.  -Was hopeful that patient's glucose levels would decrease now that IV steroids stopped. However, patient's CBGs have remained >200 mg/dl.  -Note Lantus increased to 32 units daily yesterday (12/08).  -Spoke with patient this AM about current insulin orders.  Explained to patient why we are giving her insulin and why we think her blood sugars are elevated.  Explained the difference between Lantus and Novolog and how we give/dose both of these insulins.  Patient stated she understands why we are giving her insulin.  Has gotten insulin in the hospital during previous admissions.    -Patient also stated to me that she was discharged home in the Spring of this year on insulin vial and syringe while she was getting a steroid taper.  Is familiar with giving self injections with vial and syringe and stated she would be OK if she needs to do this again at time of  d/c.     --Will follow patient during hospitalization--  Wyn Quaker RN, MSN, CDE Diabetes Coordinator Inpatient Glycemic Control Team Team Pager: [phone] (8a-5p)

## 2015-09-26 NOTE — Progress Notes (Signed)
[facility] PROGRESS NOTE  Janice Ball IFO:[id number] DOB: [birth year] DOA: 09/21/2015 PCP: [name], NP  HPI/Brief narrative 59 year old female history of recurrent non-small cell lung cancer on chemotherapy status post radiation been followed by Dr. 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 [name] (ENT) who presented with a productive cough shortness of breath and a skin infection. On admission patient none to have a transient A. fib and went back into her chronic sinus tachycardia. Patient was admitted to the step down unit and being treated for an acute COPD exacerbation and noted to be in sepsis with a skin infection also noted. Patient noted to have significant airway noise. Patient was placed on IV steroids, scheduled nebulizers, empiric IV antibiotics of vancomycin and aztreonam. Patient was pancultured and cultures pending. Sputum Gram stain and culture with moderate [place] consistent with Candida species. Patient placed on Diflucan. IV antibiotics, transitioned to oral antibiotics and IV steroids have been transitioned to oral prednisone. Patient is slowly improving.  Assessment/Plan: #1 acute COPD exacerbation  Chest x-ray negative for any acute infiltrate. Patient remains congested with upper airway noise. Patient with less diffuse wheezing and some coarse breath sounds. Influenza PCR negative. Continue Atrovent, Xopenex scheduled nebulizers, Mucinex, change IV steroids to oral prednisone and taper. Continue Pulmicort and Brovana, Claritin, Flonase. Continue NT suction as needed. Patient reports some improvement with flutter valve.  -Pt reports continued improvement with chest PT, will continue -Follow up CXR neg for acute process -This AM, pt continues with thick secretions with coarse breath sounds   #2 Cellulitis with  sepsis present on admit Patient initially noted to have a leukocytosis with a white count of 21.6, tachycardic, with a elevated lactate level. May be secondary to skin infection and COPD exacerbation. Blood cultures and urine cx unremarkable. Sputum Gram stain and culture with moderate yeast consistent with Candida species. Repeat lactate level improved at 1.1. WBC trending down since admission. Continue IV fluids, treatment for COPD exacerbation. DC'd IV vancomycin. D/C IV aztreonam. For now, continue oral doxycycline for skin infection. Continue Diflucan.  #3 oral pharyngeal candidiasis Sputum Gram stain and culture with moderate ease consistent with Candida species. Patient with an acute COPD exacerbation on steroids. Patient with a history of recurrent non-small cell lung cancer on chemotherapy and also with a vaginal candidiasis. Patient is continued on Diflucan with plans to complete total of 2 weeks of tx  #4 leukocytosis Leukocytosis trending down. Patient has been pancultured. Cultures unremarkable thus far. Continue empiric antibiotics.  #5 skin infection over right lateral forearm Bandage around forearm. D/C IV aztreonam. Continue on oral doxycycline. Wound care consult.  #6 diabetes mellitus type 2 Hemoglobin A1c was 8.2 on 08/16/2015. Patient CBG on admission was 553. Patient is continued on Lantus 28 units daily, meal coverage insulin 8 units NovoLog, resistant sliding scale insulin. IV steroids have been transitioned to oral steroids will need to follow CBGs closely.  -Lantus was increased to 32 units for glucose in the 200-300's.  -If still poor control, then would increase pre-meal insulin to 10 units  #7 hypocalcemia Continue calcium citrate 500 mg 4 times a day with water and vitamin D 1000 international units by mouth daily with water.   #8 sinus tachycardia Likely secondary to problem #1 and 2. Tachycardia improved however still tachycardic and seems chronic in review of  patient's epic. Continued on Lopressor  to 25 mg by mouth twice a day. Follow.  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 Continue Synthroid.  #11 hyperlipidemia LDL was 95 On 04/21/2015 Continue Lipitor.  #12 depression/anxiety Stable. No homicidal or suicidal ideations. Continue Zoloft and Ativan.  #13 paroxysmal/Transient new onset A. fib with RVR >>>>>SINUS TACHYCARDIA Likely secondary to COPD exacerbation. Patient, back in sinus tachycardia which is chronic. CHA2DS2Vasc score is 3. Patient on anticoagulation. First set of troponin slightly elevated at 0.7. Serial enzymes trended down. Patient with recent 2-D echo on 08/17/2015 with a EF of 65-70%. No wall motion abnormalities. Patient off Cardizem drip. patient's heart rate is still elevated and in looking back at her records has been elevated in the 120s to 130s chronically. TSH at 1.500 on 08/16/2015 . Increased Lopressor to 25 mg twice a day and continue to titrate as needed.   #14 recurrent non-small cell lung cancer patient on concurrent chemoradiation. Outpatient follow-up. Informed oncology of patient's admission via Epic.Dr Earlie Server came by social visit.  #15 vaginal candidiasis Status post Diflucan 150 mg 1, now continued on diflucan per above  #16 prophylaxis PPI for GI prophylaxis. XARELTO for DVT prophylaxis.  Code Status: DNR Family Communication: Pt in room Disposition Plan: Home health when respiratory function further improves   Consultants:    Procedures:    Antibiotics: Anti-infectives    Start     Dose/Rate Route Frequency Ordered Stop   09/23/15 1800  fluconazole (DIFLUCAN) IVPB 100 mg     100 mg 50 mL/hr over 60 Minutes Intravenous Every 24 hours 09/22/15 1708     09/22/15 1800  fluconazole (DIFLUCAN) IVPB 200 mg     200 mg 100 mL/hr over 60 Minutes Intravenous  Once 09/22/15  1708 09/22/15 1909   09/22/15 1000  doxycycline (VIBRA-TABS) tablet 100 mg     100 mg Oral Every 12 hours 09/22/15 0840     09/21/15 2000  fluconazole (DIFLUCAN) tablet 150 mg     150 mg Oral  Once 09/21/15 1815 09/21/15 1951   09/20/15 0200  aztreonam (AZACTAM) 2 g in dextrose 5 % 50 mL IVPB  Status:  Discontinued     2 g 100 mL/hr over 30 Minutes Intravenous 3 times per day 10/15/2015 2259 09/23/15 0913   09/26/2015 2359  vancomycin (VANCOCIN) IVPB 750 mg/150 ml premix  Status:  Discontinued     750 mg 150 mL/hr over 60 Minutes Intravenous Every 12 hours 09/18/2015 2258 09/22/15 0840   09/30/2015 1545  cefTRIAXone (ROCEPHIN) 1 g in dextrose 5 % 50 mL IVPB     1 g 100 mL/hr over 30 Minutes Intravenous  Once 10/08/2015 1533 09/18/2015 1641   09/24/2015 1545  azithromycin (ZITHROMAX) 500 mg in dextrose 5 % 250 mL IVPB     500 mg 250 mL/hr over 60 Minutes Intravenous  Once 10/15/2015 1533 10/11/2015 1759      HPI/Subjective: Claims to feel slightly better despite continued thick, ample secretions  Objective: Filed Vitals:   09/26/15 0502 09/26/15 0747 09/26/15 0757 09/26/15 0928  BP: 128/55   120/60  Pulse: 96   110  Temp: 98.5 F (36.9 C)     TempSrc: Axillary     Resp: 16     Height:      Weight:      SpO2: 100% 98% 98%     Intake/Output Summary (Last 24 hours) at  09/26/15 1144 Last data filed at 09/26/15 3428  Gross per 24 hour  Intake   1300 ml  Output    500 ml  Net    800 ml   Filed Weights   09/20/15 0100 09/22/15 0400 09/23/15 0500  Weight: 65.4 kg (144 lb 2.9 oz) 65.6 kg (144 lb 10 oz) 66.3 kg (146 lb 2.6 oz)    Exam:   General:  Awake, laying in bed, in nad  Cardiovascular: regular, s1, s2  Respiratory: hoarse voice, coarse breath sounds, normal resp effort  Abdomen: soft, nondistended  Musculoskeletal: perfused, no clubbing, no cyanosis  Data Reviewed: Basic Metabolic Panel:  Recent Labs Lab 09/20/15 0830 09/21/15 0500 09/22/15 0340 09/23/15 0341  09/24/15 0425 09/25/15 0305  NA  --  136 135 136 137 135  K  --  3.7 3.6 4.2 3.8 4.0  CL  --  101 95* 97* 95* 95*  CO2  --  29 30 32 35* 33*  GLUCOSE  --  283* 364* 332* 259* 358*  BUN  --  '13 19 19 20 '$ 21*  CREATININE  --  0.51 0.54 0.47 0.55 0.48  CALCIUM 7.0* 7.6* 7.9* 8.2* 8.3* 8.2*  MG 1.5* 2.0 1.7 1.8 1.4* 1.7  PHOS 4.0  --   --   --   --   --    Liver Function Tests:  Recent Labs Lab 10/12/2015 1535 09/21/15 0500 09/23/15 0341  AST 23 17 40  ALT 22 19 34  ALKPHOS 114 85 76  BILITOT 0.8 0.6 0.2*  PROT 6.9 5.5* 5.4*  ALBUMIN 3.3* 2.5* 2.4*   No results for input(s): LIPASE, AMYLASE in the last 168 hours. No results for input(s): AMMONIA in the last 168 hours. CBC:  Recent Labs Lab 10/15/2015 1535  09/21/15 0500 09/22/15 0340 09/23/15 0341 09/24/15 0425 09/25/15 0305  WBC 21.6*  < > 12.7* 11.0* 10.4 11.3* 10.9*  NEUTROABS 18.5*  --   --   --   --   --   --   HGB 12.6  < > 9.4* 9.3* 9.0* 10.3* 9.7*  HCT 40.5  < > 30.6* 30.4* 29.8* 33.6* 32.0*  MCV 80.5  < > 80.1 81.7 80.8 82.0 80.6  PLT 249  < > 135* 162 141* 146* 150  < > = values in this interval not displayed. Cardiac Enzymes:  Recent Labs Lab 09/20/15 0211 09/20/15 0830 09/20/15 1600  TROPONINI 0.07* 0.04* 0.04*   BNP (last 3 results)  Recent Labs  04/20/15 0340 08/08/15 0543 09/20/15 0500  BNP 32.5 37.2 220.0*    ProBNP (last 3 results) No results for input(s): PROBNP in the last 8760 hours.  CBG:  Recent Labs Lab 09/25/15 0743 09/25/15 1159 09/25/15 1655 09/25/15 2100 09/26/15 0735  GLUCAP 308* 289* 373* 255* 163*    Recent Results (from the past 240 hour(s))  Blood Culture (routine x 2)     Status: None   Collection Time: 10/17/2015  3:34 PM  Result Value Ref Range Status   Specimen Description BLOOD LEFT ARM  5 ML IN [facility] BOTTLE  Final   Special Requests NONE  Final   Culture   Final    NO GROWTH 5 DAYS Performed at [facility]    Report Status 09/24/2015 FINAL   Final  Blood Culture (routine x 2)     Status: None   Collection Time: 10/11/2015  3:43 PM  Result Value Ref Range Status   Specimen Description BLOOD RIGHT  PORT  5 ML IN [facility] BOTTLE  Final   Special Requests NONE  Final   Culture   Final    NO GROWTH 5 DAYS Performed at [facility]    Report Status 09/24/2015 FINAL  Final  Urine culture     Status: None   Collection Time: 10/02/2015  9:34 PM  Result Value Ref Range Status   Specimen Description URINE, CLEAN CATCH  Final   Special Requests NONE  Final   Culture   Final    MULTIPLE SPECIES PRESENT, SUGGEST RECOLLECTION Performed at [facility]    Report Status 09/21/2015 FINAL  Final  MRSA PCR Screening     Status: None   Collection Time: 09/29/2015 11:42 PM  Result Value Ref Range Status   MRSA by PCR NEGATIVE NEGATIVE Final    Comment:        The GeneXpert MRSA Assay (FDA approved for NASAL specimens only), is one component of a comprehensive MRSA colonization surveillance program. It is not intended to diagnose MRSA infection nor to guide or monitor treatment for MRSA infections.   Culture, respiratory (NON-Expectorated)     Status: None   Collection Time: 09/20/15  8:00 AM  Result Value Ref Range Status   Specimen Description SPUTUM  Final   Special Requests NONE  Final   Gram Stain   Final    THIS SPECIMEN IS ACCEPTABLE FOR SPUTUM CULTURE MODERATE WBC PRESENT, PREDOMINANTLY PMN RARE SQUAMOUS EPITHELIAL CELLS PRESENT RARE GRAM POSITIVE COCCI IN PAIRS RARE GRAM POSITIVE RODS    Culture   Final    MODERATE YEAST CONSISTENT WITH CANDIDA SPECIES Performed at [facility]    Report Status 09/23/2015 FINAL  Final  Culture, expectorated sputum-assessment     Status: None   Collection Time: 09/20/15 10:04 AM  Result Value Ref Range Status   Specimen Description SPUTUM  Final   Special Requests Immunocompromised  Final   Sputum evaluation   Final    THIS SPECIMEN IS ACCEPTABLE. RESPIRATORY  CULTURE REPORT TO FOLLOW.   Report Status 09/20/2015 FINAL  Final     Studies: No results found.  Scheduled Meds: . antiseptic oral rinse  7 mL Mouth Rinse BID  . arformoterol  15 mcg Nebulization BID  . aspirin  81 mg Oral Daily  . atorvastatin  20 mg Oral QHS  . budesonide (PULMICORT) nebulizer solution  0.25 mg Nebulization BID  . calcium citrate  200 mg of elemental calcium Oral QID  . cholecalciferol  1,000 Units Oral Daily  . cyanocobalamin  1,500 mcg Oral Daily  . docusate sodium  100 mg Oral BID  . doxycycline  100 mg Oral Q12H  . feeding supplement (GLUCERNA SHAKE)  237 mL Oral TID BM  . fluconazole (DIFLUCAN) IV  100 mg Intravenous Q24H  . fluticasone  2 spray Each Nare Daily  . guaiFENesin  1,200 mg Oral BID  . insulin aspart  0-20 Units Subcutaneous TID WC  . insulin aspart  0-5 Units Subcutaneous QHS  . insulin aspart  8 Units Subcutaneous TID WC  . insulin glargine  32 Units Subcutaneous Daily  . ipratropium  0.5 mg Nebulization Q6H  . levalbuterol  1.25 mg Nebulization Q6H  . levothyroxine  137 mcg Oral Daily  . loratadine  10 mg Oral Daily  . magnesium oxide  400 mg Oral Daily  . metoprolol tartrate  25 mg Oral BID  . miconazole  1 Applicatorful Vaginal QHS  . montelukast  10 mg Oral QHS  . multivitamin with minerals  1 tablet Oral Daily  . omega-3 acid ethyl esters  1 g Oral BID  . pantoprazole  40 mg Oral Daily  . rivaroxaban  20 mg Oral Q supper  . sertraline  150 mg Oral QHS  . sodium chloride  10-40 mL Intracatheter Q12H  . sodium chloride  3 mL Intravenous Q12H  . spironolactone  25 mg Oral Daily   Continuous Infusions:   Principal Problem:   COPD with acute exacerbation (HCC) Active Problems:   Primary cancer of right upper lobe of lung (HCC)   Hypothyroidism   DM type 2 (diabetes mellitus, type 2) (HCC)   HLD (hyperlipidemia)   Anxiety state   Sinus tachycardia ([place])   Sepsis ([place])   DVT (deep venous thrombosis) (HCC)   Unilateral vocal  cord paralysis   Long term current use of anticoagulant therapy   Acute on chronic respiratory failure (HCC)   Depression   PE (physical exam), annual   Skin infection   Atrial fibrillation with RVR ([place])   Hypocalcemia   Malignant neoplasm of upper lobe of lung ([place])   DM type 2, goal A1c below 7   Oropharyngeal candidiasis   Eloisa Chokshi, [facility] Pager [phone]. If 7PM-7AM, please contact night-coverage at [email or web address], password [facility] 09/26/2015, 11:44 AM  LOS: 7 days

## 2015-09-26 NOTE — Progress Notes (Signed)
Physical Therapy Treatment Patient Details Name: Janice Ball MRN: [id number] DOB: 1956-04-07 Today's Date: 09/26/2015    History of Present Illness Pt admitted with COPD exacerbation and hx of lung CA, PE and DM, Vocal cord paralysis    PT Comments    Assisted OOB to [facility] then amb in hallway.  Pt demonstrates increased fatigue/overall weakness with extended length of stay.  Also reports poor sleep at night with freq BSC trips to void.  VERY EXHAUSTED with noted decreased amb distance tolerance each session.  Amb on RA avg 92% and HR 114.   Pt might possibly need ST Rehab at SNF.  Will update LPT on current condition.   Follow Up Recommendations  Home health PT;Supervision - Intermittent (might have to start thinking about ST Rehab at SNF due to extended hospital stay and mobility decline)     Equipment Recommendations  None recommended by PT    Recommendations for Other Services       Precautions / Restrictions Precautions Precautions: Fall Precaution Comments: monitor O2 Restrictions Weight Bearing Restrictions: No    Mobility  Bed Mobility Overal bed mobility: Needs Assistance Bed Mobility: Supine to Sit;Sit to Supine     Supine to sit: Supervision Sit to supine: Supervision   General bed mobility comments: increased time  Transfers Overall transfer level: Needs assistance Equipment used: None Transfers: Sit to/from [facility] Sit to Stand: Supervision Stand pivot transfers: Supervision       General transfer comment: assisted to [facility]  Good use of hands to steady self.    Ambulation/Gait Ambulation/Gait assistance: Min guard;Min assist Ambulation Distance (Feet): 65 Feet Assistive device: Rolling walker (2 wheeled) Gait Pattern/deviations: Step-through pattern;Decreased stride length;Trunk flexed Gait velocity: decreased   General Gait Details: amb decreased distance/tolerance due to increased c/o overall fatigue (mainly poor sleep at  night/freq urination).  Very limited activity tolerance.  RA avg 92% with HHR 114.  Lateral drift right/left.  Really tired.   Stairs            Wheelchair Mobility    Modified Rankin (Stroke Patients Only)       Balance                                    Cognition                            Exercises      General Comments        Pertinent Vitals/Pain      Home Living                      Prior Function            PT Goals (current goals can now be found in the care plan section) Progress towards PT goals: Progressing toward goals    Frequency  Min 3X/week    PT Plan Current plan remains appropriate    Co-evaluation             End of Session Equipment Utilized During Treatment: Gait belt Activity Tolerance: Patient limited by fatigue Patient left: in bed;with call bell/phone within reach;with bed alarm set     Time: 1530-1545 PT Time Calculation (min) (ACUTE ONLY): 15 min  Charges:  $Gait Training: 8-22 mins  G Codes:      Rica Koyanagi  PTA [facility]  Acute  Rehab Pager      [phone]

## 2015-09-26 NOTE — Care Management Important Message (Signed)
Important Message  Patient Details  Name: JAQUASIA DOSCHER MRN: [id number] Date of Birth: 05-31-1956   Medicare Important Message Given:  Yes    Camillo Flaming 09/26/2015, 12:03 [place] Message  Patient Details  Name: ERTHA NABOR MRN: [id number] Date of Birth: [birth year]   Medicare Important Message Given:  Yes    Camillo Flaming 09/26/2015, 12:03 PM

## 2015-09-26 NOTE — Progress Notes (Signed)
Patient refused CPT at this time. We will attempt treatment at next scheduled time. RN aware.

## 2015-09-27 DIAGNOSIS — C3411 Malignant neoplasm of upper lobe, right bronchus or lung: Secondary | ICD-10-CM

## 2015-09-27 DIAGNOSIS — J441 Chronic obstructive pulmonary disease with (acute) exacerbation: Secondary | ICD-10-CM

## 2015-09-27 DIAGNOSIS — J9621 Acute and chronic respiratory failure with hypoxia: Secondary | ICD-10-CM

## 2015-09-27 DIAGNOSIS — J9622 Acute and chronic respiratory failure with hypercapnia: Secondary | ICD-10-CM

## 2015-09-27 LAB — GLUCOSE, CAPILLARY
GLUCOSE-CAPILLARY: 148 mg/dL — AB (ref 65–99)
Glucose-Capillary: 188 mg/dL — ABNORMAL HIGH (ref 65–99)
Glucose-Capillary: 94 mg/dL (ref 65–99)
Glucose-Capillary: 312 mg/dL — ABNORMAL HIGH (ref 65–99)

## 2015-09-27 MED ORDER — BISOPROLOL FUMARATE 5 MG PO TABS
5.0000 mg | ORAL_TABLET | Freq: Every day | ORAL | Status: DC
  Administered 2015-09-27 – 2015-10-04 (×8): 5 mg via ORAL
  Filled 2015-09-27 (×8): qty 1

## 2015-09-27 MED ORDER — METHYLPREDNISOLONE SODIUM SUCC 125 MG IJ SOLR
80.0000 mg | Freq: Two times a day (BID) | INTRAMUSCULAR | Status: DC
  Administered 2015-09-27 – 2015-10-01 (×9): 80 mg via INTRAVENOUS
  Filled 2015-09-27 (×9): qty 1.28

## 2015-09-27 MED ORDER — INSULIN ASPART 100 UNIT/ML ~~LOC~~ SOLN
10.0000 [IU] | Freq: Three times a day (TID) | SUBCUTANEOUS | Status: DC
  Administered 2015-09-27 – 2015-09-29 (×6): 10 [IU] via SUBCUTANEOUS

## 2015-09-27 MED ORDER — PANTOPRAZOLE SODIUM 40 MG PO TBEC
40.0000 mg | DELAYED_RELEASE_TABLET | Freq: Two times a day (BID) | ORAL | Status: DC
  Administered 2015-09-27 – 2015-10-04 (×14): 40 mg via ORAL
  Filled 2015-09-27 (×16): qty 1

## 2015-09-27 NOTE — Progress Notes (Signed)
[facility] PROGRESS NOTE  Janice Ball GGY:[id number] DOB: [birth year] DOA: 09/29/2015 PCP: [name], NP  HPI/Brief narrative 59 year old female history of recurrent non-small cell lung cancer on chemotherapy status post radiation been followed by Dr. 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 [name] (ENT) who presented with a productive cough shortness of breath and a skin infection. On admission patient none to have a transient A. fib and went back into her chronic sinus tachycardia. Patient was admitted to the step down unit and being treated for an acute COPD exacerbation and noted to be in sepsis with a skin infection also noted. Patient noted to have significant airway noise. Patient was placed on IV steroids, scheduled nebulizers, empiric IV antibiotics of vancomycin and aztreonam. Patient was pancultured and cultures pending. Sputum Gram stain and culture with moderate [place] consistent with Candida species. Patient placed on Diflucan. IV antibiotics, transitioned to oral antibiotics and IV steroids have been transitioned to oral prednisone. Patient is slowly improving.  Assessment/Plan: #1 acute COPD exacerbation  Chest x-ray negative for any acute infiltrate. Patient remains congested with upper airway noise. Patient with less diffuse wheezing and some coarse breath sounds. Influenza PCR negative. Continue Atrovent, Xopenex scheduled nebulizers, Mucinex, change IV steroids to oral prednisone and taper. Continue Pulmicort and Brovana, Claritin, Flonase. Continue NT suction as needed. -Case was earlier discussed with Pulmonary who recommended scopolamine patch and trial of lasix -Unfortunately, no significant improvement in secretions, thus Pulmonary was formally consulted today. Appreciate input by Dr. Melvyn Novas. Essentially, plan to  maximize effort to improve airway mechanics with trial of solumedrol, off scopolamine and atrovent and to maximize GERD tx. Also recs to transition to bisoprolol over metoprolol  #2 Cellulitis with sepsis present on admit Patient initially was noted to have a leukocytosis with a white count of 21.6, tachycardic, with a elevated lactate level. May be secondary to skin infection and COPD exacerbation. Blood cultures and urine cx unremarkable. Sputum Gram stain and culture with moderate yeast consistent with Candida species. Repeat lactate level improved at 1.1. WBC trending down since admission. Continue IV fluids, treatment for COPD exacerbation. DC'd IV vancomycin. D/C IV aztreonam. For now, continue oral doxycycline for skin infection. Continue Diflucan.  #3 oral pharyngeal candidiasis Sputum Gram stain and culture with moderate [place] consistent with Candida species. Patient with an acute COPD exacerbation on steroids. Patient with a history of recurrent non-small cell lung cancer on chemotherapy and also with a vaginal candidiasis. Patient is continued on Diflucan with plans to complete total of 2 weeks of tx  #4 leukocytosis Leukocytosis had trended down. Patient has been pancultured. Cultures unremarkable thus far. Continue empiric antibiotics.  #5 skin infection over right lateral forearm Bandage around forearm. D/C IV aztreonam. Continue on oral doxycycline. Wound care was consulted.  #6 diabetes mellitus type 2 Hemoglobin A1c was 8.2 on 08/16/2015. Patient CBG on admission was 553. Patient is continued on Lantus 28 units daily, meal coverage insulin 8 units NovoLog, resistant sliding scale insulin. IV steroids have been transitioned to oral steroids will need to follow CBGs closely.  -Lantus was earlier increased to 32 units -Glucose in the 200's.  -Will increase pre-meal insulin to 10 units  #7 hypocalcemia Continue calcium citrate 500 mg 4 times a day with water and vitamin D 1000  international units by mouth daily with water.   #8 sinus tachycardia  Likely secondary to problem #1 and 2. Tachycardia improved however still tachycardic and seems chronic in review of patient's epic. Continued on Lopressor to 25 mg by mouth twice a day. Follow.  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 Continue Synthroid.  #11 hyperlipidemia LDL was 95 On 04/21/2015 Continue Lipitor.  #12 depression/anxiety Stable. No homicidal or suicidal ideations. Continue Zoloft and Ativan.  #13 paroxysmal/Transient new onset A. fib with RVR >>>>>SINUS TACHYCARDIA Likely secondary to COPD exacerbation. Patient, back in sinus tachycardia which is chronic. CHA2DS2Vasc score is 3. Patient on anticoagulation. First set of troponin slightly elevated at 0.7. Serial enzymes trended down. Patient with recent 2-D echo on 08/17/2015 with a EF of 65-70%. No wall motion abnormalities. Patient off Cardizem drip. patient's heart rate is still elevated and in looking back at her records has been elevated in the 120s to 130s chronically. TSH at 1.500 on 08/16/2015 . Increased Lopressor to 25 mg twice a day and continue to titrate as needed.   #14 recurrent non-small cell lung cancer patient on concurrent chemoradiation. Outpatient follow-up. Informed oncology of patient's admission via Epic.Dr Earlie Server came by social visit.  #15 vaginal candidiasis Status post Diflucan 150 mg 1, now continued on diflucan per above  #16 prophylaxis PPI for GI prophylaxis. XARELTO for DVT prophylaxis.  Code Status: DNR Family Communication: Pt in room Disposition Plan: Home health when respiratory function further improves   Consultants:    Procedures:    Antibiotics: Anti-infectives    Start     Dose/Rate Route Frequency Ordered Stop   09/23/15 1800  fluconazole (DIFLUCAN) IVPB 100 mg     100  mg 50 mL/hr over 60 Minutes Intravenous Every 24 hours 09/22/15 1708     09/22/15 1800  fluconazole (DIFLUCAN) IVPB 200 mg     200 mg 100 mL/hr over 60 Minutes Intravenous  Once 09/22/15 1708 09/22/15 1909   09/22/15 1000  doxycycline (VIBRA-TABS) tablet 100 mg     100 mg Oral Every 12 hours 09/22/15 0840     09/21/15 2000  fluconazole (DIFLUCAN) tablet 150 mg     150 mg Oral  Once 09/21/15 1815 09/21/15 1951   09/20/15 0200  aztreonam (AZACTAM) 2 g in dextrose 5 % 50 mL IVPB  Status:  Discontinued     2 g 100 mL/hr over 30 Minutes Intravenous 3 times per day 09/21/2015 2259 09/23/15 0913   10/15/2015 2359  vancomycin (VANCOCIN) IVPB 750 mg/150 ml premix  Status:  Discontinued     750 mg 150 mL/hr over 60 Minutes Intravenous Every 12 hours 10/08/2015 2258 09/22/15 0840   09/26/2015 1545  cefTRIAXone (ROCEPHIN) 1 g in dextrose 5 % 50 mL IVPB     1 g 100 mL/hr over 30 Minutes Intravenous  Once 10/08/2015 1533 10/07/2015 1641   10/13/2015 1545  azithromycin (ZITHROMAX) 500 mg in dextrose 5 % 250 mL IVPB     500 mg 250 mL/hr over 60 Minutes Intravenous  Once 10/18/2015 1533 09/25/2015 1759      HPI/Subjective: Still complains of marked secretions this AM, not significantly improved with current regimen  Objective: Filed Vitals:   09/27/15 0838 09/27/15 0848 09/27/15 0915 09/27/15 1412  BP:   134/78   Pulse:   121   Temp:      TempSrc:      Resp:   24   Height:  Weight:      SpO2: 100% 100%  94%    Intake/Output Summary (Last 24 hours) at 09/27/15 1553 Last data filed at 09/26/15 1800  Gross per 24 hour  Intake    530 ml  Output      0 ml  Net    530 ml   Filed Weights   09/20/15 0100 09/22/15 0400 09/23/15 0500  Weight: 65.4 kg (144 lb 2.9 oz) 65.6 kg (144 lb 10 oz) 66.3 kg (146 lb 2.6 oz)    Exam:   General:  Awake, laying in bed, in nad  Cardiovascular: regular, s1, s2  Respiratory: hoarse voice, markedly coarse breath sounds, normal resp effort  Abdomen: soft,  nondistended  Musculoskeletal: perfused, no clubbing, no cyanosis  Data Reviewed: Basic Metabolic Panel:  Recent Labs Lab 09/21/15 0500 09/22/15 0340 09/23/15 0341 09/24/15 0425 09/25/15 0305  NA 136 135 136 137 135  K 3.7 3.6 4.2 3.8 4.0  CL 101 95* 97* 95* 95*  CO2 29 30 32 35* 33*  GLUCOSE 283* 364* 332* 259* 358*  BUN '13 19 19 20 '$ 21*  CREATININE 0.51 0.54 0.47 0.55 0.48  CALCIUM 7.6* 7.9* 8.2* 8.3* 8.2*  MG 2.0 1.7 1.8 1.4* 1.7   Liver Function Tests:  Recent Labs Lab 09/21/15 0500 09/23/15 0341  AST 17 40  ALT 19 34  ALKPHOS 85 76  BILITOT 0.6 0.2*  PROT 5.5* 5.4*  ALBUMIN 2.5* 2.4*   No results for input(s): LIPASE, AMYLASE in the last 168 hours. No results for input(s): AMMONIA in the last 168 hours. CBC:  Recent Labs Lab 09/21/15 0500 09/22/15 0340 09/23/15 0341 09/24/15 0425 09/25/15 0305  WBC 12.7* 11.0* 10.4 11.3* 10.9*  HGB 9.4* 9.3* 9.0* 10.3* 9.7*  HCT 30.6* 30.4* 29.8* 33.6* 32.0*  MCV 80.1 81.7 80.8 82.0 80.6  PLT 135* 162 141* 146* 150   Cardiac Enzymes:  Recent Labs Lab 09/20/15 1600  TROPONINI 0.04*   BNP (last 3 results)  Recent Labs  04/20/15 0340 08/08/15 0543 09/20/15 0500  BNP 32.5 37.2 220.0*    ProBNP (last 3 results) No results for input(s): PROBNP in the last 8760 hours.  CBG:  Recent Labs Lab 09/26/15 0735 09/26/15 1139 09/26/15 1714 09/26/15 2047 09/27/15 0750  GLUCAP 163* 307* 300* 244* 188*    Recent Results (from the past 240 hour(s))  Blood Culture (routine x 2)     Status: None   Collection Time: 10/11/2015  3:34 PM  Result Value Ref Range Status   Specimen Description BLOOD LEFT ARM  5 ML IN [facility] BOTTLE  Final   Special Requests NONE  Final   Culture   Final    NO GROWTH 5 DAYS Performed at [facility]    Report Status 09/24/2015 FINAL  Final  Blood Culture (routine x 2)     Status: None   Collection Time: 10/05/2015  3:43 PM  Result Value Ref Range Status   Specimen Description  BLOOD RIGHT PORT  5 ML IN [facility] BOTTLE  Final   Special Requests NONE  Final   Culture   Final    NO GROWTH 5 DAYS Performed at [facility]    Report Status 09/24/2015 FINAL  Final  Urine culture     Status: None   Collection Time: 09/29/2015  9:34 PM  Result Value Ref Range Status   Specimen Description URINE, CLEAN CATCH  Final   Special Requests NONE  Final   Culture  Final    MULTIPLE SPECIES PRESENT, SUGGEST RECOLLECTION Performed at [facility]    Report Status 09/21/2015 FINAL  Final  MRSA PCR Screening     Status: None   Collection Time: 09/18/2015 11:42 PM  Result Value Ref Range Status   MRSA by PCR NEGATIVE NEGATIVE Final    Comment:        The GeneXpert MRSA Assay (FDA approved for NASAL specimens only), is one component of a comprehensive MRSA colonization surveillance program. It is not intended to diagnose MRSA infection nor to guide or monitor treatment for MRSA infections.   Culture, respiratory (NON-Expectorated)     Status: None   Collection Time: 09/20/15  8:00 AM  Result Value Ref Range Status   Specimen Description SPUTUM  Final   Special Requests NONE  Final   Gram Stain   Final    THIS SPECIMEN IS ACCEPTABLE FOR SPUTUM CULTURE MODERATE WBC PRESENT, PREDOMINANTLY PMN RARE SQUAMOUS EPITHELIAL CELLS PRESENT RARE GRAM POSITIVE COCCI IN PAIRS RARE GRAM POSITIVE RODS    Culture   Final    MODERATE YEAST CONSISTENT WITH CANDIDA SPECIES Performed at [facility]    Report Status 09/23/2015 FINAL  Final  Culture, expectorated sputum-assessment     Status: None   Collection Time: 09/20/15 10:04 AM  Result Value Ref Range Status   Specimen Description SPUTUM  Final   Special Requests Immunocompromised  Final   Sputum evaluation   Final    THIS SPECIMEN IS ACCEPTABLE. RESPIRATORY CULTURE REPORT TO FOLLOW.   Report Status 09/20/2015 FINAL  Final     Studies: Ct Chest W Contrast  09/26/2015  CLINICAL DATA:  Subsequent  encounter for recurrent non-small cell lung cancer EXAM: CT CHEST WITH CONTRAST TECHNIQUE: Multidetector CT imaging of the chest was performed during intravenous contrast administration. CONTRAST:  42m OMNIPAQUE IOHEXOL 300 MG/ML  SOLN COMPARISON:  08/16/2015. FINDINGS: Mediastinum / Lymph Nodes: There is no axillary lymphadenopathy. Confluent soft tissue attenuation in the upper mediastinum is again noted. The abnormal soft tissue encases the superior vena cava, trachea/carina, and right upper lobe pulmonary arteries. SVC stent device again noted. Right Port-A-Cath tip is positioned near the junction of the IVC/RA. Soft tissue fullness appears slightly progressed in the subcarinal station, but overall, there is not been a substantial interval change. Heart size is normal. No pericardial effusion. Coronary artery calcification is noted. Lungs / Pleura: Right upper lobe collapse is again noted. Abnormal soft tissue in the right hilum generates mass effect on the bronchus intermedius and both mainstem bronchi. Left lung is clear. No evidence for substantial pleural effusion. Upper Abdomen: Visualized portions of the liver and spleen are unremarkable. Tiny right adrenal nodule measures 7 mm today compared to 10 mm previously. MSK / Soft Tissues: Bone windows reveal no worrisome lytic or sclerotic osseous lesions. IMPRESSION: 1. No substantial interval change and appearance of the abnormal soft tissue attenuation in the mediastinum. This is compatible with reported history of recurrent non-small cell lung cancer and mass-effect on the airways and right main pulmonary artery is stable. 2. Stent device again noted in the superior vena cava. Patency of the stent cannot be confirmed on today's study. 3. Interval decrease in size of a tiny right adrenal nodule. Electronically Signed   By: EMisty StanleyM.D.   On: 09/26/2015 17:07    Scheduled Meds: . antiseptic oral rinse  7 mL Mouth Rinse BID  . arformoterol  15 mcg  Nebulization BID  . aspirin  81 mg Oral Daily  . atorvastatin  20 mg Oral QHS  . bisoprolol  5 mg Oral Daily  . budesonide (PULMICORT) nebulizer solution  0.25 mg Nebulization BID  . calcium citrate  200 mg of elemental calcium Oral QID  . cholecalciferol  1,000 Units Oral Daily  . cyanocobalamin  1,500 mcg Oral Daily  . docusate sodium  100 mg Oral BID  . doxycycline  100 mg Oral Q12H  . feeding supplement (GLUCERNA SHAKE)  237 mL Oral TID BM  . fluconazole (DIFLUCAN) IV  100 mg Intravenous Q24H  . fluticasone  2 spray Each Nare Daily  . guaiFENesin  1,200 mg Oral BID  . insulin aspart  0-20 Units Subcutaneous TID WC  . insulin aspart  0-5 Units Subcutaneous QHS  . insulin aspart  8 Units Subcutaneous TID WC  . insulin glargine  32 Units Subcutaneous Daily  . levalbuterol  1.25 mg Nebulization Q6H  . levothyroxine  137 mcg Oral Daily  . magnesium oxide  400 mg Oral Daily  . methylPREDNISolone (SOLU-MEDROL) injection  80 mg Intravenous Q12H  . miconazole  1 Applicatorful Vaginal QHS  . montelukast  10 mg Oral QHS  . multivitamin with minerals  1 tablet Oral Daily  . pantoprazole  40 mg Oral BID AC  . rivaroxaban  20 mg Oral Q supper  . sertraline  150 mg Oral QHS  . sodium chloride  10-40 mL Intracatheter Q12H  . sodium chloride  3 mL Intravenous Q12H  . spironolactone  25 mg Oral Daily   Continuous Infusions:   Principal Problem:   COPD with acute exacerbation (HCC) Active Problems:   Primary cancer of right upper lobe of lung (HCC)   Hypothyroidism   DM type 2 (diabetes mellitus, type 2) (HCC)   HLD (hyperlipidemia)   Anxiety state   Sinus tachycardia ([place])   Sepsis ([place])   DVT (deep venous thrombosis) (HCC)   Unilateral vocal cord paralysis   Long term current use of anticoagulant therapy   Acute on chronic respiratory failure (HCC)   Depression   PE (physical exam), annual   Skin infection   Atrial fibrillation with RVR ([place])   Hypocalcemia   Malignant  neoplasm of upper lobe of lung ([place])   DM type 2, goal A1c below 7   Oropharyngeal candidiasis   Yamilette Garretson, [facility] Pager [phone]. If 7PM-7AM, please contact night-coverage at [email or web address], password [facility] 09/27/2015, 3:53 PM  LOS: 8 days
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
## 2015-09-27 NOTE — Progress Notes (Signed)
RT Note: RT attempted to do chest vest or flutter valve with the patient but she was asleep when I arrived. Rt woke patient up to discuss the therapy with her but she does not want to do it presently. Rt told her we will come back again and do it with her when she feels up to doing it. Rt will continue to monitor.

## 2015-09-27 NOTE — Progress Notes (Signed)
RT Note: Patient refused to use her chest vest this AM. She states that she is in pain and that it makes it worse so she wants to not do it. Rt explained its benefit but offered her the flutter valve to use in place of the chest vest. Patient agrees to use the flutter valve so it will be done with her after she completes her nebulizer treatments. Rt will continue to monitor.

## 2015-09-28 DIAGNOSIS — B370 Candidal stomatitis: Secondary | ICD-10-CM

## 2015-09-28 LAB — GLUCOSE, CAPILLARY
Glucose-Capillary: 371 mg/dL — ABNORMAL HIGH (ref 65–99)
Glucose-Capillary: 381 mg/dL — ABNORMAL HIGH (ref 65–99)
Glucose-Capillary: 249 mg/dL — ABNORMAL HIGH (ref 65–99)
Glucose-Capillary: 299 mg/dL — ABNORMAL HIGH (ref 65–99)

## 2015-09-28 MED ORDER — INSULIN GLARGINE 100 UNIT/ML ~~LOC~~ SOLN
40.0000 [IU] | Freq: Every day | SUBCUTANEOUS | Status: DC
  Administered 2015-09-29: 40 [IU] via SUBCUTANEOUS
  Filled 2015-09-28 (×2): qty 0.4

## 2015-09-28 NOTE — Progress Notes (Signed)
RT note: Flutter/CPT not done 0000, pt. wanting to rest, has productive cough, RT to monitor.

## 2015-09-28 NOTE — Progress Notes (Signed)
RT note Pt. sleeping at this time in no apparent distress, not awoken for CPT, RT to monitor.

## 2015-09-28 NOTE — Progress Notes (Signed)
RT note: Pt. Agreed/able to use Flutter device without difficulty, just finished snack, chest vest not used at this time.

## 2015-09-28 NOTE — Progress Notes (Addendum)
[facility] PROGRESS NOTE  Janice Ball RFF:[id number] DOB: 1956-04-29 DOA: 09/23/2015 PCP: [name], NP  HPI/Brief narrative 59 year old female history of recurrent non-small cell lung cancer on chemotherapy status post radiation been followed by Dr. 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 [name] (ENT) who presented with a productive cough shortness of breath and a skin infection. On admission patient none to have a transient A. fib and went back into her chronic sinus tachycardia. Patient was admitted to the step down unit and being treated for an acute COPD exacerbation and noted to be in sepsis with a skin infection also noted. Patient noted to have significant airway noise. Patient was placed on IV steroids, scheduled nebulizers, empiric IV antibiotics of vancomycin and aztreonam. Patient was pancultured and cultures pending. Sputum Gram stain and culture with moderate [place] consistent with Candida species. Patient placed on Diflucan. IV antibiotics, transitioned to oral antibiotics and IV steroids have been transitioned to oral prednisone. Patient is slowly improving.  Assessment/Plan: #1 acute COPD exacerbation  Chest x-ray negative for any acute infiltrate. Patient remains congested with upper airway noise. Patient with less diffuse wheezing and some coarse breath sounds. Influenza PCR negative. Continue Xopenex scheduled nebulizers, Mucinex. Continue Pulmicort and Brovana, Claritin, Flonase. Continue NT suction as needed. -Unfortunately, no significant improvement in secretions, thus Pulmonary was formally consulted today. Appreciate input by Dr. Melvyn Novas with recs to maximize effort to improve airway mechanics with trial of solumedrol, off scopolamine and atrovent and to maximize GERD tx. Also recs to transition to bisoprolol over  metoprolol -This AM, patient reports feeling better  #2 Cellulitis with sepsis present on admit Patient initially was noted to have a leukocytosis with a white count of 21.6, tachycardic, with a elevated lactate level. May be secondary to skin infection and COPD exacerbation. Blood cultures and urine cx unremarkable. Sputum Gram stain and culture with moderate yeast consistent with Candida species. Repeat lactate level improved at 1.1. WBC trending down since admission. Continue IV fluids, treatment for COPD exacerbation. DC'd IV vancomycin. D/C IV aztreonam. For now, continue oral doxycycline for skin infection. Continue Diflucan.  #3 oral pharyngeal candidiasis Sputum Gram stain and culture with moderate ease consistent with Candida species. Patient with an acute COPD exacerbation on steroids. Patient with a history of recurrent non-small cell lung cancer on chemotherapy and also with a vaginal candidiasis. Patient is continued on Diflucan with plans to complete total of 2 weeks of tx  #4 leukocytosis Leukocytosis had trended down. Patient has been pancultured. Cultures unremarkable thus far. Continue empiric antibiotics.  #5 skin infection over right lateral forearm Bandage around forearm. D/C IV aztreonam. Continue on oral doxycycline. Wound care was consulted.  #6 diabetes mellitus type 2 Hemoglobin A1c was 8.2 on 08/16/2015. Patient CBG on admission was 553. Patient is continued on Lantus 28 units daily, meal coverage insulin 8 units NovoLog, resistant sliding scale insulin. IV steroids have been transitioned to oral steroids will need to follow CBGs closely.  -Lantus was earlier increased to 32 units and pre-meal insulin increased to 10 units -Glucose now in the mid-upper 300's after IV steroids resumed. Will increase lantus to 40 units  #7 hypocalcemia Continue calcium citrate 500 mg 4 times a day with water and vitamin D 1000 international units by mouth daily with water.   #8 sinus  tachycardia Likely secondary to problem #1 and  2. Tachycardia improved however still tachycardic and seems chronic in review of patient's epic. Continued on Lopressor to 25 mg by mouth twice a day. Follow.  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 Continue Synthroid.  #11 hyperlipidemia LDL was 95 On 04/21/2015 Continue Lipitor.  #12 depression/anxiety Stable. No homicidal or suicidal ideations. Continue Zoloft and Ativan.  #13 paroxysmal/Transient new onset A. fib with RVR >>>>>SINUS TACHYCARDIA Likely secondary to COPD exacerbation. Patient, back in sinus tachycardia which is chronic. CHA2DS2Vasc score is 3. Patient on anticoagulation. First set of troponin slightly elevated at 0.7. Serial enzymes trended down. Patient with recent 2-D echo on 08/17/2015 with a EF of 65-70%. No wall motion abnormalities. Patient off Cardizem drip. patient's heart rate is still elevated and in looking back at her records has been elevated in the 120s to 130s chronically. TSH at 1.500 on 08/16/2015 . Increased Lopressor to 25 mg twice a day and continue to titrate as needed.   #14 recurrent non-small cell lung cancer patient on concurrent chemoradiation. Outpatient follow-up. Informed oncology of patient's admission via Epic.Dr Earlie Server came by social visit.  #15 vaginal candidiasis Status post Diflucan 150 mg 1, now continued on diflucan per above  #16 prophylaxis PPI for GI prophylaxis. XARELTO for DVT prophylaxis.    Code Status: DNR Family Communication: Pt in room Disposition Plan: Home health when respiratory function further improves   Consultants:  Pulmonary  Procedures:    Antibiotics: Anti-infectives    Start     Dose/Rate Route Frequency Ordered Stop   09/23/15 1800  fluconazole (DIFLUCAN) IVPB 100 mg     100 mg 50 mL/hr over 60 Minutes Intravenous Every  24 hours 09/22/15 1708     09/22/15 1800  fluconazole (DIFLUCAN) IVPB 200 mg     200 mg 100 mL/hr over 60 Minutes Intravenous  Once 09/22/15 1708 09/22/15 1909   09/22/15 1000  doxycycline (VIBRA-TABS) tablet 100 mg     100 mg Oral Every 12 hours 09/22/15 0840     09/21/15 2000  fluconazole (DIFLUCAN) tablet 150 mg     150 mg Oral  Once 09/21/15 1815 09/21/15 1951   09/20/15 0200  aztreonam (AZACTAM) 2 g in dextrose 5 % 50 mL IVPB  Status:  Discontinued     2 g 100 mL/hr over 30 Minutes Intravenous 3 times per day 10/06/2015 2259 09/23/15 0913   10/05/2015 2359  vancomycin (VANCOCIN) IVPB 750 mg/150 ml premix  Status:  Discontinued     750 mg 150 mL/hr over 60 Minutes Intravenous Every 12 hours 09/21/2015 2258 09/22/15 0840   09/22/2015 1545  cefTRIAXone (ROCEPHIN) 1 g in dextrose 5 % 50 mL IVPB     1 g 100 mL/hr over 30 Minutes Intravenous  Once 10/14/2015 1533 09/21/2015 1641   09/22/2015 1545  azithromycin (ZITHROMAX) 500 mg in dextrose 5 % 250 mL IVPB     500 mg 250 mL/hr over 60 Minutes Intravenous  Once 10/02/2015 1533 10/03/2015 1759      HPI/Subjective: Reports now starting to feel better and better able to expectorate mucus  Objective: Filed Vitals:   09/27/15 1934 09/27/15 2042 09/28/15 0120 09/28/15 0740  BP:  105/62    Pulse:  103    Temp:  98.4 F (36.9 C)    TempSrc:  Oral    Resp:      Height:  Weight:      SpO2: 95% 99% 96% 94%    Intake/Output Summary (Last 24 hours) at 09/28/15 1150 Last data filed at 09/27/15 1623  Gross per 24 hour  Intake    410 ml  Output      0 ml  Net    410 ml   Filed Weights   09/20/15 0100 09/22/15 0400 09/23/15 0500  Weight: 65.4 kg (144 lb 2.9 oz) 65.6 kg (144 lb 10 oz) 66.3 kg (146 lb 2.6 oz)    Exam:   General:  Asleep, easily arousable, laying in bed, in nad  Cardiovascular: regular, s1, s2  Respiratory: hoarse voice, markedly coarse breath sounds, normal resp effort  Abdomen: soft, nondistended  Musculoskeletal:  perfused, no clubbing, no cyanosis  Data Reviewed: Basic Metabolic Panel:  Recent Labs Lab 09/22/15 0340 09/23/15 0341 09/24/15 0425 09/25/15 0305  NA 135 136 137 135  K 3.6 4.2 3.8 4.0  CL 95* 97* 95* 95*  CO2 30 32 35* 33*  GLUCOSE 364* 332* 259* 358*  BUN '19 19 20 '$ 21*  CREATININE 0.54 0.47 0.55 0.48  CALCIUM 7.9* 8.2* 8.3* 8.2*  MG 1.7 1.8 1.4* 1.7   Liver Function Tests:  Recent Labs Lab 09/23/15 0341  AST 40  ALT 34  ALKPHOS 76  BILITOT 0.2*  PROT 5.4*  ALBUMIN 2.4*   No results for input(s): LIPASE, AMYLASE in the last 168 hours. No results for input(s): AMMONIA in the last 168 hours. CBC:  Recent Labs Lab 09/22/15 0340 09/23/15 0341 09/24/15 0425 09/25/15 0305  WBC 11.0* 10.4 11.3* 10.9*  HGB 9.3* 9.0* 10.3* 9.7*  HCT 30.4* 29.8* 33.6* 32.0*  MCV 81.7 80.8 82.0 80.6  PLT 162 141* 146* 150   Cardiac Enzymes: No results for input(s): CKTOTAL, CKMB, CKMBINDEX, TROPONINI in the last 168 hours. BNP (last 3 results)  Recent Labs  04/20/15 0340 08/08/15 0543 09/20/15 0500  BNP 32.5 37.2 220.0*    ProBNP (last 3 results) No results for input(s): PROBNP in the last 8760 hours.  CBG:  Recent Labs Lab 09/27/15 0750 09/27/15 1137 09/27/15 1622 09/27/15 2112 09/28/15 0719  GLUCAP 188* 94 148* 312* 371*    Recent Results (from the past 240 hour(s))  Blood Culture (routine x 2)     Status: None   Collection Time: 09/29/2015  3:34 PM  Result Value Ref Range Status   Specimen Description BLOOD LEFT ARM  5 ML IN [facility] BOTTLE  Final   Special Requests NONE  Final   Culture   Final    NO GROWTH 5 DAYS Performed at [facility]    Report Status 09/24/2015 FINAL  Final  Blood Culture (routine x 2)     Status: None   Collection Time: 10/09/2015  3:43 PM  Result Value Ref Range Status   Specimen Description BLOOD RIGHT PORT  5 ML IN [facility] BOTTLE  Final   Special Requests NONE  Final   Culture   Final    NO GROWTH 5 DAYS Performed at [facility]    Report Status 09/24/2015 FINAL  Final  Urine culture     Status: None   Collection Time: 10/10/2015  9:34 PM  Result Value Ref Range Status   Specimen Description URINE, CLEAN CATCH  Final   Special Requests NONE  Final   Culture   Final    MULTIPLE SPECIES PRESENT, SUGGEST RECOLLECTION Performed at [facility]    Report Status 09/21/2015  FINAL  Final  MRSA PCR Screening     Status: None   Collection Time: 09/24/2015 11:42 PM  Result Value Ref Range Status   MRSA by PCR NEGATIVE NEGATIVE Final    Comment:        The GeneXpert MRSA Assay (FDA approved for NASAL specimens only), is one component of a comprehensive MRSA colonization surveillance program. It is not intended to diagnose MRSA infection nor to guide or monitor treatment for MRSA infections.   Culture, respiratory (NON-Expectorated)     Status: None   Collection Time: 09/20/15  8:00 AM  Result Value Ref Range Status   Specimen Description SPUTUM  Final   Special Requests NONE  Final   Gram Stain   Final    THIS SPECIMEN IS ACCEPTABLE FOR SPUTUM CULTURE MODERATE WBC PRESENT, PREDOMINANTLY PMN RARE SQUAMOUS EPITHELIAL CELLS PRESENT RARE GRAM POSITIVE COCCI IN PAIRS RARE GRAM POSITIVE RODS    Culture   Final    MODERATE YEAST CONSISTENT WITH CANDIDA SPECIES Performed at [facility]    Report Status 09/23/2015 FINAL  Final  Culture, expectorated sputum-assessment     Status: None   Collection Time: 09/20/15 10:04 AM  Result Value Ref Range Status   Specimen Description SPUTUM  Final   Special Requests Immunocompromised  Final   Sputum evaluation   Final    THIS SPECIMEN IS ACCEPTABLE. RESPIRATORY CULTURE REPORT TO FOLLOW.   Report Status 09/20/2015 FINAL  Final  Culture, respiratory (NON-Expectorated)     Status: None (Preliminary result)   Collection Time: 09/26/15  1:45 PM  Result Value Ref Range Status   Specimen Description TRACHEAL ASPIRATE  Final   Special Requests NONE   Final   Gram Stain PENDING  Incomplete   Culture   Final    Culture reincubated for better growth Performed at [facility]    Report Status PENDING  Incomplete     Studies: Ct Chest W Contrast  09/26/2015  CLINICAL DATA:  Subsequent encounter for recurrent non-small cell lung cancer EXAM: CT CHEST WITH CONTRAST TECHNIQUE: Multidetector CT imaging of the chest was performed during intravenous contrast administration. CONTRAST:  63m OMNIPAQUE IOHEXOL 300 MG/ML  SOLN COMPARISON:  08/16/2015. FINDINGS: Mediastinum / Lymph Nodes: There is no axillary lymphadenopathy. Confluent soft tissue attenuation in the upper mediastinum is again noted. The abnormal soft tissue encases the superior vena cava, trachea/carina, and right upper lobe pulmonary arteries. SVC stent device again noted. Right Port-A-Cath tip is positioned near the junction of the IVC/RA. Soft tissue fullness appears slightly progressed in the subcarinal station, but overall, there is not been a substantial interval change. Heart size is normal. No pericardial effusion. Coronary artery calcification is noted. Lungs / Pleura: Right upper lobe collapse is again noted. Abnormal soft tissue in the right hilum generates mass effect on the bronchus intermedius and both mainstem bronchi. Left lung is clear. No evidence for substantial pleural effusion. Upper Abdomen: Visualized portions of the liver and spleen are unremarkable. Tiny right adrenal nodule measures 7 mm today compared to 10 mm previously. MSK / Soft Tissues: Bone windows reveal no worrisome lytic or sclerotic osseous lesions. IMPRESSION: 1. No substantial interval change and appearance of the abnormal soft tissue attenuation in the mediastinum. This is compatible with reported history of recurrent non-small cell lung cancer and mass-effect on the airways and right main pulmonary artery is stable. 2. Stent device again noted in the superior vena cava. Patency of the stent cannot be  confirmed on today's study. 3. Interval decrease in size of a tiny right adrenal nodule. Electronically Signed   By: Misty Stanley M.D.   On: 09/26/2015 17:07    Scheduled Meds: . antiseptic oral rinse  7 mL Mouth Rinse BID  . arformoterol  15 mcg Nebulization BID  . aspirin  81 mg Oral Daily  . atorvastatin  20 mg Oral QHS  . bisoprolol  5 mg Oral Daily  . budesonide (PULMICORT) nebulizer solution  0.25 mg Nebulization BID  . calcium citrate  200 mg of elemental calcium Oral QID  . cholecalciferol  1,000 Units Oral Daily  . cyanocobalamin  1,500 mcg Oral Daily  . docusate sodium  100 mg Oral BID  . doxycycline  100 mg Oral Q12H  . feeding supplement (GLUCERNA SHAKE)  237 mL Oral TID BM  . fluconazole (DIFLUCAN) IV  100 mg Intravenous Q24H  . fluticasone  2 spray Each Nare Daily  . guaiFENesin  1,200 mg Oral BID  . insulin aspart  0-20 Units Subcutaneous TID WC  . insulin aspart  0-5 Units Subcutaneous QHS  . insulin aspart  10 Units Subcutaneous TID WC  . insulin glargine  32 Units Subcutaneous Daily  . levalbuterol  1.25 mg Nebulization Q6H  . levothyroxine  137 mcg Oral Daily  . magnesium oxide  400 mg Oral Daily  . methylPREDNISolone (SOLU-MEDROL) injection  80 mg Intravenous Q12H  . montelukast  10 mg Oral QHS  . multivitamin with minerals  1 tablet Oral Daily  . pantoprazole  40 mg Oral BID AC  . rivaroxaban  20 mg Oral Q supper  . sertraline  150 mg Oral QHS  . sodium chloride  10-40 mL Intracatheter Q12H  . sodium chloride  3 mL Intravenous Q12H  . spironolactone  25 mg Oral Daily   Continuous Infusions:   Principal Problem:   COPD with acute exacerbation (HCC) Active Problems:   Primary cancer of right upper lobe of lung (HCC)   Hypothyroidism   DM type 2 (diabetes mellitus, type 2) (HCC)   HLD (hyperlipidemia)   Anxiety state   Sinus tachycardia ([place])   Sepsis ([place])   DVT (deep venous thrombosis) (HCC)   Unilateral vocal cord paralysis   Long term current  use of anticoagulant therapy   Acute on chronic respiratory failure (HCC)   Depression   PE (physical exam), annual   Skin infection   Atrial fibrillation with RVR ([place])   Hypocalcemia   Malignant neoplasm of upper lobe of lung ([place])   DM type 2, goal A1c below 7   Oropharyngeal candidiasis   [facility], [facility] Pager [phone]. If 7PM-7AM, please contact night-coverage at [email or web address], password [facility] 09/28/2015, 11:50 AM  LOS: 9 days

## 2015-09-29 ENCOUNTER — Inpatient Hospital Stay (HOSPITAL_COMMUNITY): Payer: Medicare Other

## 2015-09-29 LAB — BASIC METABOLIC PANEL
ANION GAP: 8 (ref 5–15)
BUN: 27 mg/dL — AB (ref 6–20)
CHLORIDE: 98 mmol/L — AB (ref 101–111)
CO2: 28 mmol/L (ref 22–32)
Calcium: 8.2 mg/dL — ABNORMAL LOW (ref 8.9–10.3)
Creatinine, Ser: 0.69 mg/dL (ref 0.44–1.00)
Glucose, Bld: 375 mg/dL — ABNORMAL HIGH (ref 65–99)
POTASSIUM: 5.1 mmol/L (ref 3.5–5.1)
SODIUM: 134 mmol/L — AB (ref 135–145)

## 2015-09-29 LAB — CBC
HEMATOCRIT: 33.2 % — AB (ref 36.0–46.0)
HEMOGLOBIN: 10.5 g/dL — AB (ref 12.0–15.0)
MCH: 25.5 pg — ABNORMAL LOW (ref 26.0–34.0)
MCHC: 31.6 g/dL (ref 30.0–36.0)
MCV: 80.8 fL (ref 78.0–100.0)
Platelets: 172 10*3/uL (ref 150–400)
RBC: 4.11 MIL/uL (ref 3.87–5.11)
RDW: 16.9 % — AB (ref 11.5–15.5)
WBC: 13.3 10*3/uL — AB (ref 4.0–10.5)

## 2015-09-29 LAB — CULTURE, RESPIRATORY W GRAM STAIN

## 2015-09-29 LAB — GLUCOSE, CAPILLARY
GLUCOSE-CAPILLARY: 403 mg/dL — AB (ref 65–99)
GLUCOSE-CAPILLARY: 240 mg/dL — AB (ref 65–99)
GLUCOSE-CAPILLARY: 219 mg/dL — AB (ref 65–99)
Glucose-Capillary: 385 mg/dL — ABNORMAL HIGH (ref 65–99)

## 2015-09-29 LAB — CULTURE, RESPIRATORY

## 2015-09-29 MED ORDER — BUDESONIDE 0.5 MG/2ML IN SUSP
0.5000 mg | Freq: Two times a day (BID) | RESPIRATORY_TRACT | Status: DC
  Administered 2015-09-29 – 2015-10-04 (×9): 0.5 mg via RESPIRATORY_TRACT
  Filled 2015-09-29 (×10): qty 2

## 2015-09-29 MED ORDER — INSULIN ASPART 100 UNIT/ML ~~LOC~~ SOLN: 12.0000 [IU] | Freq: Three times a day (TID) | SUBCUTANEOUS | Status: DC

## 2015-09-29 MED ORDER — INSULIN ASPART 100 UNIT/ML ~~LOC~~ SOLN
15.0000 [IU] | Freq: Three times a day (TID) | SUBCUTANEOUS | Status: DC
  Administered 2015-09-29: 15 [IU] via SUBCUTANEOUS

## 2015-09-29 NOTE — Progress Notes (Signed)
[facility] PROGRESS NOTE  Janice Ball:[id number] DOB: 1956/08/07 DOA: 09/20/2015 PCP: [name], NP  HPI/Brief narrative 59 year old female history of recurrent non-small cell lung cancer on chemotherapy status post radiation been followed by Dr. 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 [name] (ENT) who presented with a productive cough shortness of breath and a skin infection. On admission patient none to have a transient A. fib and went back into her chronic sinus tachycardia. Patient was admitted to the step down unit and being treated for an acute COPD exacerbation and noted to be in sepsis with a skin infection also noted. Patient noted to have significant airway noise. Patient was placed on IV steroids, scheduled nebulizers, empiric IV antibiotics of vancomycin and aztreonam. Patient was pancultured and cultures pending. Sputum Gram stain and culture with moderate [place] consistent with Candida species. Patient placed on Diflucan. IV antibiotics, transitioned to oral antibiotics and IV steroids have been transitioned to oral prednisone. Patient is slowly improving.  Assessment/Plan: #1 acute COPD exacerbation  -Chest x-ray negative for any acute infiltrate.  -Patient remains congested with upper airway noise.  -Patient with less diffuse wheezing and some coarse breath sounds.  -Influenza PCR negative.  -Continue Xopenex scheduled nebulizers, Mucinex. Continue Pulmicort and Brovana, Claritin, Flonase.  -Continue NT suction as needed. -No significant improvement in secretions, thus Pulmonary was formally consulted. Appreciate input by Dr. Melvyn Novas - Continue with BID IV solumedrol -Additional recs today to increase frequency of flutter valve   #2 Cellulitis with sepsis present on admit -Patient initially was noted to have a  leukocytosis with a white count of 21.6, tachycardic, with a elevated lactate level.  -Suspected may be secondary to skin infection and COPD exacerbation.  - Blood cultures and urine cx unremarkable.  - Sputum Gram stain and culture with moderate yeast consistent with Candida species.  - WBC has since trended down since admission.  - Continue IV fluids, treatment for COPD exacerbation.  - DC'd IV vancomycin. D/C IV aztreonam. For now, continue oral doxycycline for skin infection. Continue Diflucan.  #3 oral pharyngeal candidiasis - Sputum Gram stain and culture with moderate ease consistent with Candida species. - Patient with an acute COPD exacerbation on steroids.  - Patient with a history of recurrent non-small cell lung cancer on chemotherapy and also with a vaginal candidiasis.  -Patient is continued on Diflucan with plans to complete total of 2 weeks of tx  #4 leukocytosis - Leukocytosis had trended down.  - Patient has been pancultured.  - Cultures unremarkable thus far. Continued antibiotics per above  #5 skin infection over right lateral forearm - Bandage around forearm.  - Continue on oral doxycycline.  - Wound care was consulted.  #6 diabetes mellitus type 2 - Hemoglobin A1c was 8.2 on 08/16/2015. - With concurrent steroids, glucose noted to be in the 300's - Lantus currently at 32 units and pre-meal insulin at 10 units - Increased lantus to 40 units - Will increase pre-meal to 15 units  #7 hypocalcemia - Continue calcium citrate 500 mg 4 times a day with water and vitamin D 1000 international units by mouth daily with water.   #8 sinus tachycardia - Continued on Lopressor to 25 mg by mouth twice a day. 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  - Continue Synthroid.  #11 hyperlipidemia - LDL was 95 On 04/21/2015 - Continue  Lipitor.  #12 depression/anxiety - Stable. No homicidal or suicidal ideations.  - Continue Zoloft and Ativan.  #13 paroxysmal/Transient new onset A. fib with RVR >>>>>SINUS TACHYCARDIA - Likely secondary to COPD exacerbation.  - CHA2DS2Vasc score is 3.  - Patient on anticoagulation per above.  - Continue lopressor 25 mg twice a day and continue to titrate as needed.   #14 recurrent non-small cell lung cancer - patient on concurrent chemoradiation.  - Dr Earlie Server did sto by for social visit.  #15 vaginal candidiasis - Status post Diflucan 150 mg 1, now continued on diflucan per above  #16 prophylaxis - PPI for GI prophylaxis.  [place] Reasons for DVT prophylaxis.  Code Status: DNR Family Communication: Pt in room Disposition Plan: Home health when respiratory function further improves   Consultants:  Pulmonary  Procedures:    Antibiotics: Anti-infectives    Start     Dose/Rate Route Frequency Ordered Stop   09/23/15 1800  fluconazole (DIFLUCAN) IVPB 100 mg     100 mg 50 mL/hr over 60 Minutes Intravenous Every 24 hours 09/22/15 1708     09/22/15 1800  fluconazole (DIFLUCAN) IVPB 200 mg     200 mg 100 mL/hr over 60 Minutes Intravenous  Once 09/22/15 1708 09/22/15 1909   09/22/15 1000  doxycycline (VIBRA-TABS) tablet 100 mg     100 mg Oral Every 12 hours 09/22/15 0840     09/21/15 2000  fluconazole (DIFLUCAN) tablet 150 mg     150 mg Oral  Once 09/21/15 1815 09/21/15 1951   09/20/15 0200  aztreonam (AZACTAM) 2 g in dextrose 5 % 50 mL IVPB  Status:  Discontinued     2 g 100 mL/hr over 30 Minutes Intravenous 3 times per day 10/13/2015 2259 09/23/15 0913   10/01/2015 2359  vancomycin (VANCOCIN) IVPB 750 mg/150 ml premix  Status:  Discontinued     750 mg 150 mL/hr over 60 Minutes Intravenous Every 12 hours 09/21/2015 2258 09/22/15 0840   09/30/2015 1545  cefTRIAXone (ROCEPHIN) 1 g in dextrose 5 % 50 mL IVPB     1 g 100 mL/hr over 30 Minutes Intravenous  Once 10/03/2015 1533 09/27/2015  1641   10/03/2015 1545  azithromycin (ZITHROMAX) 500 mg in dextrose 5 % 250 mL IVPB     500 mg 250 mL/hr over 60 Minutes Intravenous  Once 10/01/2015 1533 09/26/2015 1759      HPI/Subjective: Reports feeling somewhat better this AM  Objective: Filed Vitals:   09/29/15 0824 09/29/15 1129 09/29/15 1346 09/29/15 1417  BP:    118/58  Pulse:    94  Temp:    97.8 F (36.6 C)  TempSrc:    Oral  Resp:    18  Height:      Weight:      SpO2: 88% 96% 90% 99%    Intake/Output Summary (Last 24 hours) at 09/29/15 1524 Last data filed at 09/29/15 1135  Gross per 24 hour  Intake    170 ml  Output    700 ml  Net   -530 ml   Filed Weights   09/20/15 0100 09/22/15 0400 09/23/15 0500  Weight: 65.4 kg (144 lb 2.9 oz) 65.6 kg (144 lb 10 oz) 66.3 kg (146 lb 2.6 oz)    Exam:   General:  Awake, working with OT, observed to stand from bed, in  nad  Cardiovascular: regular, s1, s2  Respiratory: hoarse voice, coarse breath sounds, normal resp effort  Abdomen: soft, nondistended  Musculoskeletal: perfused, no clubbing, no cyanosis  Data Reviewed: Basic Metabolic Panel:  Recent Labs Lab 09/23/15 0341 09/24/15 0425 09/25/15 0305 09/29/15 0256  NA 136 137 135 134*  K 4.2 3.8 4.0 5.1  CL 97* 95* 95* 98*  CO2 32 35* 33* 28  GLUCOSE 332* 259* 358* 375*  BUN 19 20 21* 27*  CREATININE 0.47 0.55 0.48 0.69  CALCIUM 8.2* 8.3* 8.2* 8.2*  MG 1.8 1.4* 1.7  --    Liver Function Tests:  Recent Labs Lab 09/23/15 0341  AST 40  ALT 34  ALKPHOS 76  BILITOT 0.2*  PROT 5.4*  ALBUMIN 2.4*   No results for input(s): LIPASE, AMYLASE in the last 168 hours. No results for input(s): AMMONIA in the last 168 hours. CBC:  Recent Labs Lab 09/23/15 0341 09/24/15 0425 09/25/15 0305 09/29/15 0256  WBC 10.4 11.3* 10.9* 13.3*  HGB 9.0* 10.3* 9.7* 10.5*  HCT 29.8* 33.6* 32.0* 33.2*  MCV 80.8 82.0 80.6 80.8  PLT 141* 146* 150 172   Cardiac Enzymes: No results for input(s): CKTOTAL, CKMB,  CKMBINDEX, TROPONINI in the last 168 hours. BNP (last 3 results)  Recent Labs  04/20/15 0340 08/08/15 0543 09/20/15 0500  BNP 32.5 37.2 220.0*    ProBNP (last 3 results) No results for input(s): PROBNP in the last 8760 hours.  CBG:  Recent Labs Lab 09/28/15 1140 09/28/15 1641 09/28/15 2104 09/29/15 0736 09/29/15 1156  GLUCAP 381* 249* 299* 385* 403*    Recent Results (from the past 240 hour(s))  Blood Culture (routine x 2)     Status: None   Collection Time: 10/11/2015  3:34 PM  Result Value Ref Range Status   Specimen Description BLOOD LEFT ARM  5 ML IN [facility] BOTTLE  Final   Special Requests NONE  Final   Culture   Final    NO GROWTH 5 DAYS Performed at [facility]    Report Status 09/24/2015 FINAL  Final  Blood Culture (routine x 2)     Status: None   Collection Time: 10/09/2015  3:43 PM  Result Value Ref Range Status   Specimen Description BLOOD RIGHT PORT  5 ML IN [facility] BOTTLE  Final   Special Requests NONE  Final   Culture   Final    NO GROWTH 5 DAYS Performed at [facility]    Report Status 09/24/2015 FINAL  Final  Urine culture     Status: None   Collection Time: 10/13/2015  9:34 PM  Result Value Ref Range Status   Specimen Description URINE, CLEAN CATCH  Final   Special Requests NONE  Final   Culture   Final    MULTIPLE SPECIES PRESENT, SUGGEST RECOLLECTION Performed at [facility]    Report Status 09/21/2015 FINAL  Final  MRSA PCR Screening     Status: None   Collection Time: 10/06/2015 11:42 PM  Result Value Ref Range Status   MRSA by PCR NEGATIVE NEGATIVE Final    Comment:        The GeneXpert MRSA Assay (FDA approved for NASAL specimens only), is one component of a comprehensive MRSA colonization surveillance program. It is not intended to diagnose MRSA infection nor to guide or monitor treatment for MRSA infections.   Culture, respiratory (NON-Expectorated)     Status: None   Collection Time: 09/20/15  8:00 AM  Result Value Ref Range Status   Specimen Description SPUTUM  Final   Special Requests NONE  Final   Gram Stain   Final    THIS SPECIMEN IS ACCEPTABLE FOR SPUTUM CULTURE MODERATE WBC PRESENT, PREDOMINANTLY PMN RARE SQUAMOUS EPITHELIAL CELLS PRESENT RARE GRAM POSITIVE COCCI IN PAIRS RARE GRAM POSITIVE RODS    Culture   Final    MODERATE YEAST CONSISTENT WITH CANDIDA SPECIES Performed at [facility]    Report Status 09/23/2015 FINAL  Final  Culture, expectorated sputum-assessment     Status: None   Collection Time: 09/20/15 10:04 AM  Result Value Ref Range Status   Specimen Description SPUTUM  Final   Special Requests Immunocompromised  Final   Sputum evaluation   Final    THIS SPECIMEN IS ACCEPTABLE. RESPIRATORY CULTURE REPORT TO FOLLOW.   Report Status 09/20/2015 FINAL  Final  Culture, respiratory (NON-Expectorated)     Status: None   Collection Time: 09/26/15  1:45 PM  Result Value Ref Range Status   Specimen Description TRACHEAL ASPIRATE  Final   Special Requests NONE  Final   Gram Stain   Final    RARE WBC PRESENT, PREDOMINANTLY PMN ABUNDANT GRAM POSITIVE COCCI IN PAIRS IN CHAINS FEW GRAM POSITIVE RODS Performed at [facility]    Culture   Final    Non-Pathogenic Oropharyngeal-type Flora Isolated. Performed at [facility]    Report Status 09/29/2015 FINAL  Final     Studies: Dg Chest Port 1 View  09/29/2015  CLINICAL DATA:  Increased shortness of breath, history lung cancer currently undergoing chemotherapy, COPD, diabetes mellitus, former smoker EXAM: PORTABLE CHEST 1 VIEW COMPARISON:  Portable exam 1311 hours compared to 09/24/2015 FINDINGS: RIGHT jugular Port-A-Cath with tip projecting over cavoatrial junction. Stable heart size and pulmonary vascularity. SVC stent present. Persistent RIGHT hilar/mediastinal enlargement due to tumor as noted on interval CT. Atherosclerotic calcification aorta. Chronic elevation RIGHT of RIGHT diaphragm  stable with volume loss in RIGHT hemi thorax. No new infiltrate, pleural effusion or pneumothorax. IMPRESSION: Persistent RIGHT hilar/mediastinal changes with volume loss in RIGHT hemi thorax. No acute infiltrate. Electronically Signed   By: Lavonia Dana M.D.   On: 09/29/2015 14:06    Scheduled Meds: . antiseptic oral rinse  7 mL Mouth Rinse BID  . arformoterol  15 mcg Nebulization BID  . aspirin  81 mg Oral Daily  . atorvastatin  20 mg Oral QHS  . bisoprolol  5 mg Oral Daily  . budesonide (PULMICORT) nebulizer solution  0.5 mg Nebulization BID  . calcium citrate  200 mg of elemental calcium Oral QID  . cholecalciferol  1,000 Units Oral Daily  . cyanocobalamin  1,500 mcg Oral Daily  . docusate sodium  100 mg Oral BID  . doxycycline  100 mg Oral Q12H  . feeding supplement (GLUCERNA SHAKE)  237 mL Oral TID BM  . fluconazole (DIFLUCAN) IV  100 mg Intravenous Q24H  . fluticasone  2 spray Each Nare Daily  . guaiFENesin  1,200 mg Oral BID  . insulin aspart  0-20 Units Subcutaneous TID WC  . insulin aspart  0-5 Units Subcutaneous QHS  . insulin aspart  10 Units Subcutaneous TID WC  . insulin glargine  40 Units Subcutaneous Daily  . levalbuterol  1.25 mg Nebulization Q6H  . levothyroxine  137 mcg Oral Daily  . magnesium oxide  400 mg Oral Daily  . methylPREDNISolone (SOLU-MEDROL) injection  80 mg Intravenous Q12H  . montelukast  10 mg  Oral QHS  . multivitamin with minerals  1 tablet Oral Daily  . pantoprazole  40 mg Oral BID AC  . rivaroxaban  20 mg Oral Q supper  . sertraline  150 mg Oral QHS  . sodium chloride  10-40 mL Intracatheter Q12H  . sodium chloride  3 mL Intravenous Q12H  . spironolactone  25 mg Oral Daily   Continuous Infusions:   Principal Problem:   COPD with acute exacerbation (HCC) Active Problems:   Primary cancer of right upper lobe of lung (HCC)   Hypothyroidism   DM type 2 (diabetes mellitus, type 2) (HCC)   HLD (hyperlipidemia)   Anxiety state   Sinus  tachycardia ([place])   Sepsis ([place])   DVT (deep venous thrombosis) (HCC)   Unilateral vocal cord paralysis   Long term current use of anticoagulant therapy   Acute on chronic respiratory failure (HCC)   Depression   PE (physical exam), annual   Skin infection   Atrial fibrillation with RVR ([place])   Hypocalcemia   Malignant neoplasm of upper lobe of lung ([place])   DM type 2, goal A1c below 7   Oropharyngeal candidiasis   Chelcea Zahn, [facility] Pager [phone]. If 7PM-7AM, please contact night-coverage at [email or web address], password [facility] 09/29/2015, 3:24 PM  LOS: 10 days

## 2015-09-29 NOTE — Progress Notes (Signed)
Pt refusing chest vest at this time, pt wants to do flutter valve instead.  RT to monitor and assess as needed.
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
## 2015-09-29 NOTE — Progress Notes (Signed)
Pt request not to be bothered at midnight for CPT.  RT to monitor and assess as needed.

## 2015-09-29 NOTE — Progress Notes (Signed)
Occupational Therapy Treatment Patient Details Name: Janice Ball MRN: [id number] DOB: 11-30-1955 Today's Date: 09/29/2015    History of present illness Pt admitted with COPD exacerbation and hx of lung CA, PE and DM, Vocal cord paralysis   OT comments  Good participation  Follow Up Recommendations  Home health OT;Supervision/Assistance - 24 hour    Equipment Recommendations  None recommended by OT    Recommendations for Other Services      Precautions / Restrictions Precautions Precautions: Fall Precaution Comments: monitor O2 Restrictions Weight Bearing Restrictions: No       Mobility Bed Mobility Overal bed mobility: Needs Assistance Bed Mobility: Supine to Sit;Sit to Supine     Supine to sit: Supervision Sit to supine: Supervision   General bed mobility comments: increased time  Transfers Overall transfer level: Needs assistance Equipment used: None   Sit to Stand: Supervision Stand pivot transfers: Supervision       General transfer comment: sit to stand  x5 focusing on hand placement and safety        ADL Overall ADL's : Needs assistance/impaired     Grooming: Wash/dry face;Standing;Min guard                   Toilet Transfer: Min guard;BSC   Toileting- Water quality scientist and Hygiene: Min guard;Sit to/from stand;Cueing for safety;Cueing for sequencing                          Cognition   Behavior During Therapy: [facility] for tasks assessed/performed Overall Cognitive Status: Within Functional Limits for tasks assessed                                    Pertinent Vitals/ Pain       Pain Assessment: No/denies pain         Frequency Min 2X/week               End of Session Equipment Utilized During Treatment: Oxygen   Activity Tolerance Patient limited by fatigue   Patient Left in bed;with call bell/phone within reach   Nurse Communication          Time: 1250-1315 OT Time Calculation (min):  25 min  Charges: OT General Charges $OT Visit: 1 Procedure OT Treatments $Self Care/Home Management : 23-37 mins  Janice Ball, Thereasa Parkin 09/29/2015, 1:39 PM

## 2015-09-30 DIAGNOSIS — R000 Tachycardia, unspecified: Secondary | ICD-10-CM

## 2015-09-30 DIAGNOSIS — J3801 Paralysis of vocal cords and larynx, unilateral: Secondary | ICD-10-CM

## 2015-09-30 LAB — GLUCOSE, CAPILLARY
GLUCOSE-CAPILLARY: 241 mg/dL — AB (ref 65–99)
GLUCOSE-CAPILLARY: 178 mg/dL — AB (ref 65–99)
Glucose-Capillary: 402 mg/dL — ABNORMAL HIGH (ref 65–99)
Glucose-Capillary: 387 mg/dL — ABNORMAL HIGH (ref 65–99)

## 2015-09-30 MED ORDER — INSULIN ASPART 100 UNIT/ML ~~LOC~~ SOLN
18.0000 [IU] | Freq: Three times a day (TID) | SUBCUTANEOUS | Status: DC
  Administered 2015-09-30 – 2015-10-04 (×11): 18 [IU] via SUBCUTANEOUS

## 2015-09-30 MED ORDER — INSULIN ASPART 100 UNIT/ML ~~LOC~~ SOLN
10.0000 [IU] | Freq: Once | SUBCUTANEOUS | Status: AC
  Administered 2015-09-30: 10 [IU] via SUBCUTANEOUS

## 2015-09-30 MED ORDER — INSULIN GLARGINE 100 UNIT/ML ~~LOC~~ SOLN
45.0000 [IU] | Freq: Every day | SUBCUTANEOUS | Status: DC
  Administered 2015-09-30 – 2015-10-04 (×5): 45 [IU] via SUBCUTANEOUS
  Filled 2015-09-30 (×6): qty 0.45

## 2015-09-30 NOTE — Progress Notes (Signed)
[facility] PROGRESS NOTE  Janice Ball LNL:[id number] DOB: [birth year] DOA: 10/15/2015 PCP: [name], NP  HPI/Brief narrative 59 year old female history of recurrent non-small cell lung cancer on chemotherapy status post radiation been followed by Dr. 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 [name] (ENT) who presented with a productive cough shortness of breath and a skin infection. On admission patient none to have a transient A. fib and went back into her chronic sinus tachycardia. Patient was admitted to the step down unit and being treated for an acute COPD exacerbation and noted to be in sepsis with a skin infection also noted. Patient noted to have significant airway noise. Patient was placed on IV steroids, scheduled nebulizers, empiric IV antibiotics of vancomycin and aztreonam. Patient was pancultured and cultures pending. Sputum Gram stain and culture with moderate [place] consistent with Candida species. Patient placed on Diflucan. IV antibiotics, transitioned to oral antibiotics and IV steroids have been transitioned to oral prednisone. Patient is slowly improving.  Assessment/Plan: #1 acute COPD exacerbation  -Chest x-ray was negative for any acute infiltrate.  -Patient remains congested with upper airway noise.  -Patient with less diffuse wheezing and some coarse breath sounds.  -Influenza PCR negative.  -Continue Xopenex scheduled nebulizers, Mucinex. Continue Pulmicort and Brovana, Claritin, Flonase.  -Continue NT suction as needed. -No significant improvement in secretions, thus Pulmonary was formally consulted. Appreciate input. - For now, continue with BID IV solumedrol per latest Pulmonary recs with chest PT/ flutter valve - Pt documented to have refused chest PT overnight - Discussed with pulmonary. Plan to  continue as planned. Pulm to re-evaluate tomorrow  #2 Cellulitis with sepsis present on admit -Patient initially was noted to have a leukocytosis with a white count of 21.6, tachycardic, with a elevated lactate level.  -Suspected may be secondary to skin infection and COPD exacerbation.  - Blood cultures and urine cx unremarkable.  - Sputum Gram stain and culture with moderate yeast consistent with Candida species.  - WBC has since trended down since admission.  - Continue IV fluids, treatment for COPD exacerbation.  - DC'd IV vancomycin. D/C IV aztreonam. For now, continue oral doxycycline for skin infection. Continue Diflucan.  #3 oral pharyngeal candidiasis - Sputum Gram stain and culture with moderate ease consistent with Candida species. - Patient with an acute COPD exacerbation on steroids.  - Patient with a history of recurrent non-small cell lung cancer on chemotherapy and also with a vaginal candidiasis.  -Patient is continued on Diflucan with plans to complete total of 2 weeks of tx  #4 leukocytosis - Leukocytosis had trended down.  - Patient has been pancultured.  - Cultures unremarkable thus far. Continued antibiotics per above  #5 skin infection over right lateral forearm - Bandage around forearm.  - Continue on oral doxycycline.  - Wound care was consulted.  #6 diabetes mellitus type 2 - Hemoglobin A1c was 8.2 on 08/16/2015. - With concurrent steroids, glucose noted to be in the 300's - Lantus currently at 32 units and pre-meal insulin at 10 units - Increased lantus to 40 units - Will increase pre-meal to 15 units  #7 hypocalcemia - Continue calcium citrate 500 mg 4 times a day with water and vitamin D 1000 international units by mouth daily with water.   #8 sinus tachycardia - Continued on Lopressor to 25 mg by mouth twice a day. 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  - Continue Synthroid.  #11 hyperlipidemia - LDL was 95 On 04/21/2015 - Continue Lipitor.  #12 depression/anxiety - Stable. No homicidal or suicidal ideations.  - Continue Zoloft and Ativan.  #13 paroxysmal/Transient new onset A. fib with RVR >>>>>SINUS TACHYCARDIA - Likely secondary to COPD exacerbation.  - CHA2DS2Vasc score is 3.  - Patient on anticoagulation per above.  - Continue lopressor 25 mg twice a day and continue to titrate as needed.   #14 recurrent non-small cell lung cancer - patient on concurrent chemoradiation.  - Dr Earlie Server did sto by for social visit.  #15 vaginal candidiasis - Status post Diflucan 150 mg 1, now continued on diflucan per above  #16 prophylaxis - PPI for GI prophylaxis.  Janice Ball for DVT prophylaxis.  Code Status: DNR Family Communication: Pt in room Disposition Plan: Home health when respiratory function further improves   Consultants:  Pulmonary  Procedures:    Antibiotics: Anti-infectives    Start     Dose/Rate Route Frequency Ordered Stop   09/23/15 1800  fluconazole (DIFLUCAN) IVPB 100 mg     100 mg 50 mL/hr over 60 Minutes Intravenous Every 24 hours 09/22/15 1708     09/22/15 1800  fluconazole (DIFLUCAN) IVPB 200 mg     200 mg 100 mL/hr over 60 Minutes Intravenous  Once 09/22/15 1708 09/22/15 1909   09/22/15 1000  doxycycline (VIBRA-TABS) tablet 100 mg     100 mg Oral Every 12 hours 09/22/15 0840     09/21/15 2000  fluconazole (DIFLUCAN) tablet 150 mg     150 mg Oral  Once 09/21/15 1815 09/21/15 1951   09/20/15 0200  aztreonam (AZACTAM) 2 g in dextrose 5 % 50 mL IVPB  Status:  Discontinued     2 g 100 mL/hr over 30 Minutes Intravenous 3 times per day 10/09/2015 2259 09/23/15 0913   09/29/2015 2359  vancomycin (VANCOCIN) IVPB 750 mg/150 ml premix  Status:  Discontinued     750 mg 150 mL/hr over 60 Minutes Intravenous Every 12 hours 10/12/2015 2258 09/22/15 0840    10/15/2015 1545  cefTRIAXone (ROCEPHIN) 1 g in dextrose 5 % 50 mL IVPB     1 g 100 mL/hr over 30 Minutes Intravenous  Once 10/18/2015 1533 10/07/2015 1641   09/20/2015 1545  azithromycin (ZITHROMAX) 500 mg in dextrose 5 % 250 mL IVPB     500 mg 250 mL/hr over 60 Minutes Intravenous  Once 10/10/2015 1533 09/22/2015 1759      HPI/Subjective: Has been refusing chest PT overnight  Objective: Filed Vitals:   09/29/15 1956 09/30/15 0115 09/30/15 0507 09/30/15 0725  BP: 127/65  128/57   Pulse: 105  89   Temp: 98.2 F (36.8 C)  97.7 F (36.5 C)   TempSrc: Oral  Oral   Resp: 19  18   Height:      Weight:      SpO2: 96% 96% 99% 97%    Intake/Output Summary (Last 24 hours) at 09/30/15 1313 Last data filed at 09/30/15 1109  Gross per 24 hour  Intake    290 ml  Output   1401 ml  Net  -1111 ml   Filed Weights   09/20/15 0100 09/22/15 0400 09/23/15 0500  Weight: 65.4 kg (144 lb 2.9 oz) 65.6 kg (144 lb 10 oz) 66.3  kg (146 lb 2.6 oz)    Exam:   General:  Awake, laying in bed, in nad, no respiratory distress  Cardiovascular: regular, s1, s2  Respiratory: hoarse voice, coarse breath sounds, normal resp effort  Abdomen: soft, nondistended  Musculoskeletal: perfused, no clubbing, no cyanosis  Data Reviewed: Basic Metabolic Panel:  Recent Labs Lab 09/24/15 0425 09/25/15 0305 09/29/15 0256  NA 137 135 134*  K 3.8 4.0 5.1  CL 95* 95* 98*  CO2 35* 33* 28  GLUCOSE 259* 358* 375*  BUN 20 21* 27*  CREATININE 0.55 0.48 0.69  CALCIUM 8.3* 8.2* 8.2*  MG 1.4* 1.7  --    Liver Function Tests: No results for input(s): AST, ALT, ALKPHOS, BILITOT, PROT, ALBUMIN in the last 168 hours. No results for input(s): LIPASE, AMYLASE in the last 168 hours. No results for input(s): AMMONIA in the last 168 hours. CBC:  Recent Labs Lab 09/24/15 0425 09/25/15 0305 09/29/15 0256  WBC 11.3* 10.9* 13.3*  HGB 10.3* 9.7* 10.5*  HCT 33.6* 32.0* 33.2*  MCV 82.0 80.6 80.8  PLT 146* 150 172    Cardiac Enzymes: No results for input(s): CKTOTAL, CKMB, CKMBINDEX, TROPONINI in the last 168 hours. BNP (last 3 results)  Recent Labs  04/20/15 0340 08/08/15 0543 09/20/15 0500  BNP 32.5 37.2 220.0*    ProBNP (last 3 results) No results for input(s): PROBNP in the last 8760 hours.  CBG:  Recent Labs Lab 09/29/15 1156 09/29/15 1707 09/29/15 2200 09/30/15 0743 09/30/15 1142  GLUCAP 403* 240* 219* 402* 387*    Recent Results (from the past 240 hour(s))  Culture, respiratory (NON-Expectorated)     Status: None   Collection Time: 09/26/15  1:45 PM  Result Value Ref Range Status   Specimen Description TRACHEAL ASPIRATE  Final   Special Requests NONE  Final   Gram Stain   Final    RARE WBC PRESENT, PREDOMINANTLY PMN ABUNDANT GRAM POSITIVE COCCI IN PAIRS IN CHAINS FEW GRAM POSITIVE RODS Performed at [facility]    Culture   Final    Non-Pathogenic Oropharyngeal-type Flora Isolated. Performed at [facility]    Report Status 09/29/2015 FINAL  Final     Studies: Dg Chest Port 1 View  09/29/2015  CLINICAL DATA:  Increased shortness of breath, history lung cancer currently undergoing chemotherapy, COPD, diabetes mellitus, former smoker EXAM: PORTABLE CHEST 1 VIEW COMPARISON:  Portable exam 1311 hours compared to 09/24/2015 FINDINGS: RIGHT jugular Port-A-Cath with tip projecting over cavoatrial junction. Stable heart size and pulmonary vascularity. SVC stent present. Persistent RIGHT hilar/mediastinal enlargement due to tumor as noted on interval CT. Atherosclerotic calcification aorta. Chronic elevation RIGHT of RIGHT diaphragm stable with volume loss in RIGHT hemi thorax. No new infiltrate, pleural effusion or pneumothorax. IMPRESSION: Persistent RIGHT hilar/mediastinal changes with volume loss in RIGHT hemi thorax. No acute infiltrate. Electronically Signed   By: Lavonia Dana M.D.   On: 09/29/2015 14:06    Scheduled Meds: . antiseptic oral rinse  7 mL  Mouth Rinse BID  . arformoterol  15 mcg Nebulization BID  . aspirin  81 mg Oral Daily  . atorvastatin  20 mg Oral QHS  . bisoprolol  5 mg Oral Daily  . budesonide (PULMICORT) nebulizer solution  0.5 mg Nebulization BID  . calcium citrate  200 mg of elemental calcium Oral QID  . cholecalciferol  1,000 Units Oral Daily  . cyanocobalamin  1,500 mcg Oral Daily  . docusate sodium  100 mg Oral BID  .  doxycycline  100 mg Oral Q12H  . feeding supplement (GLUCERNA SHAKE)  237 mL Oral TID BM  . fluconazole (DIFLUCAN) IV  100 mg Intravenous Q24H  . fluticasone  2 spray Each Nare Daily  . guaiFENesin  1,200 mg Oral BID  . insulin aspart  0-20 Units Subcutaneous TID WC  . insulin aspart  0-5 Units Subcutaneous QHS  . insulin aspart  18 Units Subcutaneous TID WC  . insulin glargine  45 Units Subcutaneous Daily  . levalbuterol  1.25 mg Nebulization Q6H  . levothyroxine  137 mcg Oral Daily  . magnesium oxide  400 mg Oral Daily  . methylPREDNISolone (SOLU-MEDROL) injection  80 mg Intravenous Q12H  . montelukast  10 mg Oral QHS  . multivitamin with minerals  1 tablet Oral Daily  . pantoprazole  40 mg Oral BID AC  . rivaroxaban  20 mg Oral Q supper  . sertraline  150 mg Oral QHS  . sodium chloride  10-40 mL Intracatheter Q12H  . sodium chloride  3 mL Intravenous Q12H  . spironolactone  25 mg Oral Daily   Continuous Infusions:   Principal Problem:   COPD with acute exacerbation (HCC) Active Problems:   Primary cancer of right upper lobe of lung (HCC)   Hypothyroidism   DM type 2 (diabetes mellitus, type 2) (HCC)   HLD (hyperlipidemia)   Anxiety state   Sinus tachycardia ([place])   Sepsis ([place])   DVT (deep venous thrombosis) (HCC)   Unilateral vocal cord paralysis   Long term current use of anticoagulant therapy   Acute on chronic respiratory failure (HCC)   Depression   PE (physical exam), annual   Skin infection   Atrial fibrillation with RVR ([place])   Hypocalcemia   Malignant neoplasm  of upper lobe of lung ([place])   DM type 2, goal A1c below 7   Oropharyngeal candidiasis   Janice Ball, [facility] Pager [phone]. If 7PM-7AM, please contact night-coverage at [email or web address], password [facility] 09/30/2015, 1:13 PM  LOS: 11 days

## 2015-09-30 NOTE — Care Management Important Message (Signed)
Important Message  Patient Details  Name: Janice Ball MRN: [id number] Date of Birth: 1956-07-03   Medicare Important Message Given:  Yes    Camillo Flaming 09/30/2015, 9:21 AMImportant Message  Patient Details  Name: Janice Ball MRN: [id number] Date of Birth: [birth year]   Medicare Important Message Given:  Yes    Camillo Flaming 09/30/2015, 9:21 AM

## 2015-09-30 NOTE — Progress Notes (Signed)
Pt refused chest vest at this time, stating she has been using the flutter valve instead.  Pt asked that we let her sleep and not wake her for next scheduled cpt.

## 2015-09-30 NOTE — Progress Notes (Signed)
Nutrition Follow-up  DOCUMENTATION CODES:   Not applicable  INTERVENTION:  - Continue Glucerna Shake TID - Continue Carb Modified diet - RD will continue to monitor for needs  NUTRITION DIAGNOSIS:   Unintentional weight loss related to chronic illness as evidenced by percent weight loss. -no new weight since 12/6 for comparison   GOAL:   Patient will meet greater than or equal to 90% of their needs -met  MONITOR:   PO intake, Supplement acceptance, Labs, Weight trends, Skin, I & O's  ASSESSMENT:   59 y.o. female with PMH of recurrent non-small cell lung cancer (on on chemotherapy and s/p of radiation, follows with Dr. [name]), SVC syndrome, PE and DVT on Xarelto, COPD, type 2 diabetes mellitus, hypothyroidism and anxiety, and left vocal cord paralysis with weak breathing for which she underwent left medialization thyroplasty on 10/4 by Dr. [name] (ENT), who presents with productive cough, SOB and the skin infection.  12/13 Pt eating 50-100% since last assessment, per chart review. Pt reports good appetite with no nausea or abdominal pain with intakes. Visualized lunch tray with 100% completion and pt sipping on ginger ale. Pt reports she continues to make an effort to drink Glucerna Shake although she still does not particularly like the flavor.   Per notes, ongoing oral pharyngeal candidiasis. Pt denies any discomfort during meals related to this.  Meeting needs. Medications reviewed. Labs reviewed; CBGs: 94-402 mg/dL, Na: 134 mmol/L, Cl: 98 mmol/L, Ca: 8.2 mg/dL, BUN elevated.   12/8 - Per chart review, pt ate 100% of breakfast 12/6 with no other intakes documented since previous assessment.  - Pt states that for breakfast this AM she ate grits, bacon, yogurt, and fruit and ate the majority of each item.  - She denies nausea or abdominal pain this AM or after meals previously.  - She does not particularly like the flavor of Glucerna Shake but states that she still makes an  effort to drink it TID as she knows that adequate nutrition is important for her. - Pt states that overall her appetite is slowly improving. - Notes indicate pt with oral pharyngeal candidiasis.  - Meeting needs on average.   12/4 - Patient reports eating 100% of her breakfast this morning, she had a BLT and fruit.  - Pt reports good appetite but she has had unexplained weight loss, ~40 lb this past year.  - UBW is around 200 lb. Pt has lost 21 lb since 10/31 (13% weight loss x >1 month, significant for time frame). - Patient reports a home health nurse encouraged her to drink supplements - RD to order Glucerna shakes TID as pt's CBGs are in the 200-300s. - Nutrition focused physical exam shows no sign of depletion of muscle mass or body fat.     Diet Order:  Diet Carb Modified Fluid consistency:: Thin; Room service appropriate?: Yes  Skin:  Wound (see comment) (R arm laceration)  Last BM:  12/12  Height:   Ht Readings from Last 1 Encounters:  09/20/15 $RemoveB'5\' 7"'OlrmACkI$  (1.702 m)    Weight:   Wt Readings from Last 1 Encounters:  09/23/15 146 lb 2.6 oz (66.3 kg)    Ideal Body Weight:  61.4 kg  BMI:  Body mass index is 22.89 kg/(m^2).  Estimated Nutritional Needs:   Kcal:  1900-2100  Protein:  90-100g  Fluid:  2L/day  EDUCATION NEEDS:   No education needs identified at this time     Jarome Matin, RD, LDN Inpatient Clinical Dietitian Pager #  [phone] After hours/weekend pager # [phone]

## 2015-09-30 NOTE — Progress Notes (Signed)
Pt has requested not to be bothered for CPT at midnight.  RT to monitor and assess as needed.

## 2015-09-30 NOTE — Progress Notes (Signed)
PT Cancellation Note  Patient Details Name: Janice Ball MRN: [id number] DOB: Jun 12, 1956   Cancelled Treatment:     checked with pt 3 different times and each time pt requested I "come back later".  Very pleasant lady with max c/o fatigue.  "I get tired just sitting up to eat".     Nathanial Rancher 09/30/2015, 3:24 PM

## 2015-10-01 DIAGNOSIS — J441 Chronic obstructive pulmonary disease with (acute) exacerbation: Secondary | ICD-10-CM | POA: Insufficient documentation

## 2015-10-01 DIAGNOSIS — Z515 Encounter for palliative care: Secondary | ICD-10-CM

## 2015-10-01 LAB — BASIC METABOLIC PANEL
Anion gap: 8 (ref 5–15)
BUN: 20 mg/dL (ref 6–20)
CHLORIDE: 94 mmol/L — AB (ref 101–111)
CO2: 31 mmol/L (ref 22–32)
CREATININE: 0.54 mg/dL (ref 0.44–1.00)
Calcium: 7.9 mg/dL — ABNORMAL LOW (ref 8.9–10.3)
GFR calc non Af Amer: >60 mL/min (ref >60)
Glucose, Bld: 380 mg/dL — ABNORMAL HIGH (ref 65–99)
POTASSIUM: 5 mmol/L (ref 3.5–5.1)
Sodium: 133 mmol/L — ABNORMAL LOW (ref 135–145)

## 2015-10-01 LAB — GLUCOSE, CAPILLARY
GLUCOSE-CAPILLARY: 311 mg/dL — AB (ref 65–99)
GLUCOSE-CAPILLARY: 273 mg/dL — AB (ref 65–99)
Glucose-Capillary: 358 mg/dL — ABNORMAL HIGH (ref 65–99)
Glucose-Capillary: 190 mg/dL — ABNORMAL HIGH (ref 65–99)
Glucose-Capillary: 162 mg/dL — ABNORMAL HIGH (ref 65–99)

## 2015-10-01 LAB — CBC
HEMATOCRIT: 33.8 % — AB (ref 36.0–46.0)
Hemoglobin: 10.4 g/dL — ABNORMAL LOW (ref 12.0–15.0)
MCH: 24.4 pg — AB (ref 26.0–34.0)
MCHC: 30.8 g/dL (ref 30.0–36.0)
MCV: 79.3 fL (ref 78.0–100.0)
PLATELETS: 176 10*3/uL (ref 150–400)
RBC: 4.26 MIL/uL (ref 3.87–5.11)
RDW: 16.6 % — ABNORMAL HIGH (ref 11.5–15.5)
WBC: 11.6 10*3/uL — ABNORMAL HIGH (ref 4.0–10.5)

## 2015-10-01 MED ORDER — INSULIN ASPART 100 UNIT/ML ~~LOC~~ SOLN
5.0000 [IU] | Freq: Once | SUBCUTANEOUS | Status: AC
  Administered 2015-10-01: 5 [IU] via SUBCUTANEOUS

## 2015-10-01 MED ORDER — METHYLPREDNISOLONE SODIUM SUCC 40 MG IJ SOLR
40.0000 mg | Freq: Two times a day (BID) | INTRAMUSCULAR | Status: DC
  Administered 2015-10-01 – 2015-10-03 (×4): 40 mg via INTRAVENOUS
  Filled 2015-10-01 (×7): qty 1

## 2015-10-01 NOTE — Progress Notes (Signed)
OT Cancellation Note  Patient Details Name: Janice Ball MRN: [id number] DOB: Apr 12, 1956   Cancelled Treatment:    Reason Eval/Treat Not Completed: Fatigue/lethargy limiting ability to participate Pt sleeping soundly. Spoke with RN. Will check back pt later in day or next day.  Betsy Pries 10/01/2015, 12:10 PM

## 2015-10-01 NOTE — Consult Note (Signed)
Consultation Note Date: 10/01/2015   Patient Name: Janice Ball  DOB: [birth year]  MRN: [id number]  Age / Sex: 59 y.o., female  PCP: [name], NP Referring Physician: Florencia Reasons, MD  Reason for Consultation: Establishing goals of care    Clinical Assessment/Narrative: History of presenting illness and hospital course: 59 year old female history of recurrent non-small cell lung cancer on chemotherapy status post radiation been followed by Dr. 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 [name] (ENT) who presented with a productive cough shortness of breath and a skin infection. On admission patient none to have a transient A. fib and went back into her chronic sinus tachycardia. Patient was admitted to the step down unit and being treated for an acute COPD exacerbation and noted to be in sepsis with a skin infection also noted. Patient noted to have significant airway noise. Patient was placed on IV steroids, scheduled nebulizers, empiric IV antibiotics of vancomycin and aztreonam. Patient was pancultured and cultures pending. Sputum Gram stain and culture with moderate [place] consistent with Candida species. Patient placed on Diflucan. IV antibiotics, transitioned to oral antibiotics and IV steroids have been transitioned to oral prednisone.  Palliative consult placed for goals of care discussions.  The patient has just returned from her PT appointment, she gets tired easily. She states that she uses O2 sparingly. Pain and dyspnea are well managed on current PO oxycodone regimen. She lives at home with her daughter. Patient states that she is no longer able to drive and that she cannot walk long distances anymore. She finds these two aspects to be most bothersome for her.    She states she met with Dr Earlie Server in this hospitalization. She is not sure if she will receive any further therapies. She describes significant symptom burden with fatigue and declining functional status.   Discussed with patient about her pain and non pain symptom management regimen, discussed about her wishes and her fears. Patient states she fears that she has less time left in this world. She wishes to discuss further with Dr Earlie Server regarding prognosis.   Patient asked whether I was from Hospice. Discussed palliative care and Hospice in detail. Patient seemed relieved. She wants to go home as soon as by 10-02-15.   I did give her information about how Hospice can help at home. She wishes to go home, discuss with Dr Earlie Server. All questions answered to the best of my ability, thank you for the consult.   Contacts/Participants in Discussion: Primary Decision Maker:  Patient herself who is currently decisional   Relationship to Patient patient, HCPOA: yes  Daughter who lives with patient.   SUMMARY OF RECOMMENDATIONS: DNR DNI Patient wishes to be D/C by 10-02-15 She wishes to further discuss with Dr Earlie Server about her lung cancer, expected prognosis etc. She asked for more information about Hospice and it was provided. She currently has [facility].  Pain and dyspnea are managed well by current dose of Oxycodone, to continue.   Code Status/Advance Care Planning: DNR    Code Status Orders        Start     Ordered   10/15/2015 2255  Do not attempt resuscitation (DNR)   Continuous    Question Answer Comment  In the event of cardiac or respiratory ARREST Do not call a "code blue"   In the event of  cardiac or respiratory ARREST Do not perform Intubation, CPR, defibrillation or ACLS   In the event of cardiac or respiratory ARREST Use medication by any route, position, wound care, and other measures to relive pain and suffering. May use oxygen, suction and manual  treatment of airway obstruction as needed for comfort.      10/11/2015 2255    Advance Directive Documentation        Most Recent Value   Type of Advance Directive  Out of facility DNR (pink MOST or yellow form)   Pre-existing out of facility DNR order (yellow form or pink MOST form)  Pink MOST form placed in chart (order not valid for inpatient use)   "MOST" Form in Place?        Other Directives:Other  Symptom Management:    continue OxyIR  Palliative Prophylaxis:   Bowel Regimen  Additional Recommendations (Limitations, Scope, Preferences):  continue current scope of treatment     Psycho-social/Spiritual:  Support System: [place] Desire for further Chaplaincy support:no Additional Recommendations: Caregiving  Support/Resources  Prognosis: Unable to determine possibly less than 6-12 months.   Discharge Planning: Home with Home Health   Chief Complaint/ Primary Diagnoses: Present on Admission:  . Unilateral vocal cord paralysis . Sepsis ([place]) . Primary cancer of right upper lobe of lung ([place]) . Hypothyroidism . HLD (hyperlipidemia) . DVT (deep venous thrombosis) ([place]) . COPD with acute exacerbation ([place]) . Acute on chronic respiratory failure ([place]) . Anxiety state . Depression . Skin infection . Atrial fibrillation with RVR ([place]) . Hypocalcemia . Sinus tachycardia ([place]) . Oropharyngeal candidiasis  I have reviewed the medical record, interviewed the patient and family, and examined the patient. The following aspects are pertinent.  Past Medical History  Diagnosis Date  . Anxiety   . Hyperlipidemia   . Hypothyroidism   . COPD (chronic obstructive pulmonary disease) ([place])   . Arthritis     thumbs  . 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 Lung cancer ([place]) dx'd 05/2009  . Lung cancer ([place]) dx'd 09/2013    recurrence in LN  . Diabetes mellitus   .  Pulmonary embolism ([place])     'I've had a blood clot in my chest'  . DNR no code (do not resuscitate) 07/31/2015   Social History   Social History  . Marital Status: Divorced    Spouse Name: N/A  . Number of Children: N/A  . Years of Education: N/A   Occupational History  . transportation coordinator    Social History Main Topics  . Smoking status: Former Smoker -- 0.50 packs/day for 45 years    Types: Cigarettes    Quit date: 11/03/2014  . Smokeless tobacco: Never Used     Comment: pt. started back smoking 10/18/2012, half a pack a day  . Alcohol Use: No  . Drug Use: No  . Sexual Activity: Not Asked   Other Topics Concern  . None   Social History Narrative   Family History  Problem Relation Age of Onset  . Colon cancer Mother   . Cancer Mother     colon  . Diabetes type II Father    Scheduled Meds: . antiseptic oral rinse  7 mL Mouth Rinse BID  . arformoterol  15 mcg Nebulization BID  . aspirin  81 mg Oral Daily  . atorvastatin  20 mg Oral QHS  . bisoprolol  5 mg Oral Daily  . budesonide (PULMICORT) nebulizer solution  0.5 mg Nebulization BID  . calcium citrate  200 mg of elemental calcium Oral QID  . cholecalciferol  1,000 Units Oral Daily  . cyanocobalamin  1,500 mcg Oral Daily  . docusate sodium  100 mg Oral BID  . doxycycline  100 mg Oral Q12H  . feeding supplement (GLUCERNA SHAKE)  237 mL Oral TID BM  . fluconazole (DIFLUCAN) IV  100 mg Intravenous Q24H  . fluticasone  2 spray Each Nare Daily  . guaiFENesin  1,200 mg Oral BID  . insulin aspart  0-20 Units Subcutaneous TID WC  . insulin aspart  0-5 Units Subcutaneous QHS  . insulin aspart  18 Units Subcutaneous TID WC  . insulin glargine  45 Units Subcutaneous Daily  . levalbuterol  1.25 mg Nebulization Q6H  . levothyroxine  137 mcg Oral Daily  . magnesium oxide  400 mg Oral Daily  . methylPREDNISolone (SOLU-MEDROL) injection  40 mg Intravenous Q12H  . montelukast  10 mg Oral QHS  . multivitamin with  minerals  1 tablet Oral Daily  . pantoprazole  40 mg Oral BID AC  . rivaroxaban  20 mg Oral Q supper  . sertraline  150 mg Oral QHS  . sodium chloride  10-40 mL Intracatheter Q12H  . sodium chloride  3 mL Intravenous Q12H  . spironolactone  25 mg Oral Daily   Continuous Infusions:  PRN Meds:.acetaminophen, chlorpheniramine-HYDROcodone, levalbuterol, LORazepam, metoprolol, oxyCODONE, sodium chloride Medications Prior to Admission:  Prior to Admission medications   Medication Sig Start Date End Date Taking? Authorizing Provider  alendronate (FOSAMAX) 10 MG tablet Take 10 mg by mouth daily.  03/20/14  Yes Historical Provider, MD  aspirin 81 MG tablet Take 81 mg by mouth daily.   Yes Historical Provider, MD  atorvastatin (LIPITOR) 20 MG tablet Take 20 mg by mouth at bedtime.   Yes Historical Provider, MD  Bioflavonoid Products (ESTER C PO) Take 1 tablet by mouth 2 (two) times daily. Ester-C Calcium Ascorbate (Vitamin C 500 mg and Calcium 55 mg)   Yes Historical Provider, MD  budesonide-formoterol (SYMBICORT) 160-4.5 MCG/ACT inhaler Inhale 2 puffs into the lungs 2 (two) times daily.   Yes Historical Provider, MD  cholecalciferol (VITAMIN D) 1000 UNITS tablet Take 1,000 Units by mouth daily.    Yes Historical Provider, MD  [employer] NATURAL LUTEIN EYE HEALTH PO Take 1 tablet by mouth at bedtime.   Yes Historical Provider, MD  Cyanocobalamin 1500 MCG TBDP Take 1,500 mcg by mouth daily. Vitamin B12   Yes Historical Provider, MD  doxycycline (VIBRAMYCIN) 100 MG capsule Take 100 mg by mouth 2 (two) times daily.  09/16/15  Yes Historical Provider, MD  glimepiride (AMARYL) 2 MG tablet Take 2 tablets (4 mg total) by mouth daily with breakfast. Take higher dose (15mby mouth) while on prednisone. Once off prednisone go back to home regimen of 2 mg by mouth daily. 08/10/15  Yes PDomenic Polite MD  Guaifenesin ([facility] STRENGTH) 1200 MG TB12 Take 1,200 mg by mouth 2 (two) times daily.   Yes Historical  Provider, MD  levothyroxine (SYNTHROID, LEVOTHROID) 137 MCG tablet Take 137 mcg by mouth daily.  01/15/15  Yes Historical Provider, MD  LORazepam (ATIVAN) 1 MG tablet Take 1 mg by mouth 3 (three) times daily as needed for anxiety.    Yes Historical Provider, MD  Magnesium Oxide 500 MG TABS Take 500 mg by mouth daily.   Yes Historical Provider,  MD  metFORMIN (GLUCOPHAGE) 1000 MG tablet Take 1,000 mg by mouth 2 (two) times daily with a meal.   Yes Historical Provider, MD  Methylsulfonylmethane (MSM) 1000 MG CAPS Take 1,000 mg by mouth daily.   Yes Historical Provider, MD  Multiple Vitamin (MULTIVITAMIN WITH MINERALS) TABS tablet Take 1 tablet by mouth daily. Spectravite   Yes Historical Provider, MD  omega-3 acid ethyl esters (LOVAZA) 1 G capsule Take 1 g by mouth 2 (two) times daily.    Yes Historical Provider, MD  oxyCODONE (OXY IR/ROXICODONE) 5 MG immediate release tablet Take 1 tablet (5 mg total) by mouth every 4 (four) hours as needed for severe pain. 09/10/15  Yes Curt Bears, MD  predniSONE (DELTASONE) 10 MG tablet 6 tab x3 days 5tab x3 days 4tabs x3 days 3tab x3 days 2tabs x3 days 1 tab x3 days 09/03/15  Yes Praveen Mannam, MD  rivaroxaban (XARELTO) 20 MG TABS tablet Take 1 tablet (20 mg total) by mouth daily with supper. 07/07/15  Yes Curt Bears, MD  sertraline (ZOLOFT) 100 MG tablet Take 150 mg by mouth at bedtime.    Yes Historical Provider, MD  spironolactone (ALDACTONE) 25 MG tablet Take 25 mg by mouth daily.  05/27/15  Yes Historical Provider, MD  tiotropium (SPIRIVA) 18 MCG inhalation capsule Place 1 capsule (18 mcg total) into inhaler and inhale daily. 04/26/15  Yes Velvet Bathe, MD  acetaminophen (TYLENOL) 325 MG tablet Take 650 mg by mouth every 6 (six) hours as needed for mild pain or headache.     Historical Provider, MD  benzonatate (TESSALON) 200 MG capsule Take 1 capsule (200 mg total) by mouth 3 (three) times daily as needed for cough. Patient not taking: Reported on  09/25/2015 08/21/15   Janece Canterbury, MD  benzonatate (TESSALON) 200 MG capsule Take 1 capsule (200 mg total) by mouth 3 (three) times daily as needed for cough. 09/03/15   Marshell Garfinkel, MD  chlorpheniramine-HYDROcodone (TUSSIONEX PENNKINETIC ER) 10-8 MG/5ML SUER Take 5 mLs by mouth 2 (two) times daily as needed for cough. 09/03/15   Praveen Mannam, MD  levofloxacin (LEVAQUIN) 500 MG tablet Take 1 tablet (500 mg total) by mouth daily. Patient not taking: Reported on 10/02/2015 09/03/15   Praveen Mannam, MD  PROAIR HFA 108 (90 BASE) MCG/ACT inhaler INHALE 2 PUFFS INTO THE LUNGS EVERY 6 HOURS AS NEEDED FOR WHEEZING OR SHORTNESS OF BREATH. 06/17/15   Brand Males, MD  prochlorperazine (COMPAZINE) 10 MG tablet Take 1 tablet (10 mg total) by mouth every 6 (six) hours as needed for nausea or vomiting. 06/25/15   Curt Bears, MD   Allergies  Allergen Reactions  . Carboplatin Rash    Skin test reaction  . Sulfonamide Derivatives Rash  . Codeine Other (See Comments)    hallucinations  . Dilaudid [Hydromorphone Hcl] Nausea And Vomiting and Other (See Comments)    Room started spinning.  Pt has been okay with low doses and nausea meds administered first  . Keflex [Cephalexin] Itching    Rash-arms, chest  . Penicillins Other (See Comments)    Unknown childhood reaction Has patient had a PCN reaction causing immediate rash, facial/tongue/throat swelling, SOB or lightheadedness with hypotension: unknown Has patient had a PCN reaction causing severe rash involving mucus membranes or skin necrosis: unknown Has patient had a PCN reaction that required hospitalization unknown Has patient had a PCN reaction occurring within the last 10 years: no If all of the above answers are "NO", then may proceed with Cephalosporin  use.   . Vicodin [Hydrocodone-Acetaminophen] Rash    Review of Systems Positive for episodic dyspnea, pain. Patient with severe deconditioning.  Physical Exam Coarse breath sounds  bilaterally NAD S1 S2 No edema Skin changes Non focal  Vital Signs: BP 117/57 mmHg  Pulse 95  Temp(Src) 97.9 F (36.6 C) (Oral)  Resp 20  Ht 5' 7"  (1.702 m)  Wt 66.3 kg (146 lb 2.6 oz)  BMI 22.89 kg/m2  SpO2 98%  SpO2: SpO2: 98 % O2 Device:SpO2: 98 % O2 Flow Rate: .O2 Flow Rate (L/min): 1.5 L/min  IO: Intake/output summary:  Intake/Output Summary (Last 24 hours) at 10/01/15 1618 Last data filed at 10/01/15 1255  Gross per 24 hour  Intake      0 ml  Output   1450 ml  Net  -1450 ml    LBM: Last BM Date: 09/29/15 Baseline Weight: Weight: 61.689 kg (136 lb) Most recent weight: Weight: 66.3 kg (146 lb 2.6 oz)      Palliative Assessment/Data:  Flowsheet Rows        Most Recent Value   Intake Tab    Referral Department  Hospitalist   Unit at Time of Referral  Med/Surg Unit   Palliative Care Type  New Palliative care   Reason for referral  Clarify Goals of Care   Date first seen by Palliative Care  10/01/15   Clinical Assessment    Palliative Performance Scale Score  30%   Pain Max last 24 hours  4   Pain Min Last 24 hours  2   Psychosocial & Spiritual Assessment    Palliative Care Outcomes       Additional Data Reviewed:  CBC:    Component Value Date/Time   WBC 11.6* 10/01/2015 0426   WBC 16.5* 09/18/2015 1029   HGB 10.4* 10/01/2015 0426   HGB 11.8 09/18/2015 1029   HCT 33.8* 10/01/2015 0426   HCT 38.1 09/18/2015 1029   PLT 176 10/01/2015 0426   PLT 258 09/18/2015 1029   MCV 79.3 10/01/2015 0426   MCV 78.1* 09/18/2015 1029   NEUTROABS 18.5* 10/13/2015 1535   NEUTROABS 13.7* 09/18/2015 1029   LYMPHSABS 1.6 09/18/2015 1535   LYMPHSABS 1.3 09/18/2015 1029   MONOABS 1.5* 10/05/2015 1535   MONOABS 1.3* 09/18/2015 1029   EOSABS 0.0 09/18/2015 1535   EOSABS 0.1 09/18/2015 1029   BASOSABS 0.0 10/09/2015 1535   BASOSABS 0.1 09/18/2015 1029   Comprehensive Metabolic Panel:    Component Value Date/Time   NA 133* 10/01/2015 0426   NA 136 09/18/2015 1029    NA 141 04/04/2012 0945   K 5.0 10/01/2015 0426   K 3.7 09/18/2015 1029   K 4.7 04/04/2012 0945   CL 94* 10/01/2015 0426   CL 101 04/06/2013 1041   CL 100 04/04/2012 0945   CO2 31 10/01/2015 0426   CO2 24 09/18/2015 1029   CO2 29 04/04/2012 0945   BUN 20 10/01/2015 0426   BUN 13.6 09/18/2015 1029   BUN 16 04/04/2012 0945   CREATININE 0.54 10/01/2015 0426   CREATININE 0.8 09/18/2015 1029   CREATININE 0.9 04/04/2012 0945   GLUCOSE 380* 10/01/2015 0426   GLUCOSE 296* 09/18/2015 1029   GLUCOSE 164* 04/06/2013 1041   GLUCOSE 253* 04/04/2012 0945   CALCIUM 7.9* 10/01/2015 0426   CALCIUM 7.0* 09/20/2015 0830   CALCIUM 8.1* 09/18/2015 1029   CALCIUM 8.4 04/04/2012 0945   AST 40 09/23/2015 0341   AST 17 09/18/2015 1029   AST 31  04/04/2012 0945   ALT 34 09/23/2015 0341   ALT 16 09/18/2015 1029   ALT 30 04/04/2012 0945   ALKPHOS 76 09/23/2015 0341   ALKPHOS 120 09/18/2015 1029   ALKPHOS 61 04/04/2012 0945   BILITOT 0.2* 09/23/2015 0341   BILITOT 0.63 09/18/2015 1029   BILITOT 0.50 04/04/2012 0945   PROT 5.4* 09/23/2015 0341   PROT 6.6 09/18/2015 1029   PROT 7.1 04/04/2012 0945   ALBUMIN 2.4* 09/23/2015 0341   ALBUMIN 3.0* 09/18/2015 1029   ALBUMIN 3.7 04/04/2012 0945     Time In:  1500Time Out: 1600 Time Total: 60 Greater than 50%  of this time was spent counseling and coordinating care related to the above assessment and plan.  Signed by: Loistine Chance, MD [id number] Loistine Chance, MD  10/01/2015, 4:18 PM  Please contact Palliative Medicine Team phone at [phone] for questions and concerns.

## 2015-10-01 NOTE — Progress Notes (Signed)
Patient refuses chest vest for the rest of the night.  She stated she is using the flutter every hour and will continue to use while awake.  Patient stated she just finished using the flutter prior to me coming in to give her a breathing treatment.

## 2015-10-01 NOTE — Progress Notes (Signed)
[facility] PROGRESS NOTE  Janice Ball WSF:[id number] DOB: 1956/05/24 DOA: 10/18/2015 PCP: [name], NP  HPI/Brief narrative 59 year old female history of recurrent non-small cell lung cancer on chemotherapy status post radiation been followed by Dr. 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 [name] (ENT) who presented with a productive cough shortness of breath and a skin infection. On admission patient none to have a transient A. fib and went back into her chronic sinus tachycardia. Patient was admitted to the step down unit and being treated for an acute COPD exacerbation and noted to be in sepsis with a skin infection also noted. Patient noted to have significant airway noise. Patient was placed on IV steroids, scheduled nebulizers, empiric IV antibiotics of vancomycin and aztreonam. Patient was pancultured and cultures pending. Sputum Gram stain and culture with moderate [place] consistent with Candida species. Patient placed on Diflucan. IV antibiotics, transitioned to oral antibiotics and IV steroids have been transitioned to oral prednisone. Patient is slowly improving.  Assessment/Plan:   #1 acute on chronic hypoxic respiratory failure, patient was hypoxic 86% on 2liters initial on presentation. Cause post obstuctive pna, copd, stage iv nonsmall cell lung ca.   -Patient remains congested with upper airway noise, not able to clear secretion effectively -Patient with less diffuse wheezing and some coarse breath sounds.  -Influenza PCR negative.  -Continue Xopenex scheduled nebulizers, Mucinex. Continue Pulmicort and Brovana, Claritin, Flonase.  -Continue NT suction as needed. -No significant improvement in secretions, thus Pulmonary was formally consulted. Appreciate input. - Pt documented to have refused chest PT  overnight -Not getting better with treatment, Pulmonology recommended palliative care consult , i have discussed with  Oncology Dr. Suzan Nailer , he agreed with palliative care consult, patient also agreed to it.   #2 Cellulitis with sepsis present on admit -Patient initially was noted to have a leukocytosis with a white count of 21.6, tachycardic, with a elevated lactate level.  -Suspected may be secondary to skin infection and COPD exacerbation.  - Blood cultures and urine cx unremarkable.  - Sputum Gram stain and culture with moderate yeast consistent with Candida species.  - WBC has since trended down since admission.  - DC'd IV vancomycin. D/C IV aztreonam. For now, continue oral doxycycline for skin infection. Continue Diflucan.  #3 oral pharyngeal candidiasis - Sputum Gram stain and culture with moderate ease consistent with Candida species. - Patient with an acute COPD exacerbation on steroids.  - Patient with a history of recurrent non-small cell lung cancer on chemotherapy and also with a vaginal candidiasis.  -Patient is continued on Diflucan with plans to complete total of 2 weeks of tx  #4 leukocytosis - Leukocytosis had trended down.  - Patient has been pancultured.  - Cultures unremarkable thus far. Continued antibiotics per above  #5 skin infection over right lateral forearm - Bandage around forearm.  - Continue on oral doxycycline.  - Wound care was consulted.  #6 diabetes mellitus type 2, insulin dependent - Hemoglobin A1c was 8.2 on 08/16/2015. - With concurrent steroids, glucose noted to be in the 300's - continue to adjust Lantus and pre-meal insulin   #7 hypocalcemia - Continue calcium citrate 500 mg 4 times a day with water and vitamin D 1000 international units by mouth daily with water.   #8 sinus tachycardia - Continued on Lopressor to 25 mg by mouth twice a day. 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  - Continue Synthroid.  #11 hyperlipidemia - LDL was 95 On 04/21/2015 - Continue Lipitor.  #12 depression/anxiety - Stable. No homicidal or suicidal ideations.  - Continue Zoloft and Ativan.  #13 paroxysmal/Transient new onset A. fib with RVR >>>>>SINUS TACHYCARDIA - Likely secondary to COPD exacerbation.  - CHA2DS2Vasc score is 3.  - Patient on anticoagulation per above.  - Continue lopressor 25 mg twice a day and continue to titrate as needed.   #14 recurrent non-small cell lung cancer - patient on concurrent chemoradiation.  - Dr Earlie Server did sto by for social visit.  #15 vaginal candidiasis - Status post Diflucan 150 mg 1, now continued on diflucan per above  #16 prophylaxis - PPI for GI prophylaxis.  [place] Reasons for DVT prophylaxis.  Code Status: DNR Family Communication: Pt in room Disposition Plan: pending palliative care input   Consultants:  Pulmonary/critical care  Oncology Dr. [name] over the phone on 12/14  Palliative care  Procedures:  none  Antibiotics: Anti-infectives    Start     Dose/Rate Route Frequency Ordered Stop   09/23/15 1800  fluconazole (DIFLUCAN) IVPB 100 mg     100 mg 50 mL/hr over 60 Minutes Intravenous Every 24 hours 09/22/15 1708     09/22/15 1800  fluconazole (DIFLUCAN) IVPB 200 mg     200 mg 100 mL/hr over 60 Minutes Intravenous  Once 09/22/15 1708 09/22/15 1909   09/22/15 1000  doxycycline (VIBRA-TABS) tablet 100 mg     100 mg Oral Every 12 hours 09/22/15 0840     09/21/15 2000  fluconazole (DIFLUCAN) tablet 150 mg     150 mg Oral  Once 09/21/15 1815 09/21/15 1951   09/20/15 0200  aztreonam (AZACTAM) 2 g in dextrose 5 % 50 mL IVPB  Status:  Discontinued     2 g 100 mL/hr over 30 Minutes Intravenous 3 times per day 09/30/2015 2259 09/23/15 0913   10/16/2015 2359  vancomycin (VANCOCIN) IVPB 750 mg/150 ml premix  Status:   Discontinued     750 mg 150 mL/hr over 60 Minutes Intravenous Every 12 hours 09/22/2015 2258 09/22/15 0840   10/02/2015 1545  cefTRIAXone (ROCEPHIN) 1 g in dextrose 5 % 50 mL IVPB     1 g 100 mL/hr over 30 Minutes Intravenous  Once 10/08/2015 1533 09/23/2015 1641   10/01/2015 1545  azithromycin (ZITHROMAX) 500 mg in dextrose 5 % 250 mL IVPB     500 mg 250 mL/hr over 60 Minutes Intravenous  Once 10/16/2015 1533 10/18/2015 1759      HPI/Subjective: Has been refusing chest PT overnight, continued upper airway sounds, not able to clear secretion effectively  Objective: Filed Vitals:   10/01/15 0232 10/01/15 0639 10/01/15 0913 10/01/15 0923  BP:  117/54    Pulse:  92    Temp:  98.2 F (36.8 C)    TempSrc:  Oral    Resp:  22    Height:      Weight:      SpO2: 97% 99% 98% 98%    Intake/Output Summary (Last 24 hours) at 10/01/15 1354 Last data filed at 10/01/15 1255  Gross per 24 hour  Intake    240 ml  Output   1450 ml  Net  -1210 ml   Filed Weights   09/20/15 0100 09/22/15 0400  09/23/15 0500  Weight: 144 lb 2.9 oz (65.4 kg) 144 lb 10 oz (65.6 kg) 146 lb 2.6 oz (66.3 kg)    Exam:   General:  Awake, laying in bed, frail, weak cough,   Cardiovascular: regular, s1, s2  Respiratory: hoarse voice, coarse breath sounds, normal resp effort  Abdomen: soft, nondistended  Musculoskeletal: perfused, no clubbing, no cyanosis  Data Reviewed: Basic Metabolic Panel:  Recent Labs Lab 09/25/15 0305 09/29/15 0256 10/01/15 0426  NA 135 134* 133*  K 4.0 5.1 5.0  CL 95* 98* 94*  CO2 33* 28 31  GLUCOSE 358* 375* 380*  BUN 21* 27* 20  CREATININE 0.48 0.69 0.54  CALCIUM 8.2* 8.2* 7.9*  MG 1.7  --   --    Liver Function Tests: No results for input(s): AST, ALT, ALKPHOS, BILITOT, PROT, ALBUMIN in the last 168 hours. No results for input(s): LIPASE, AMYLASE in the last 168 hours. No results for input(s): AMMONIA in the last 168 hours. CBC:  Recent Labs Lab 09/25/15 0305 09/29/15 0256  10/01/15 0426  WBC 10.9* 13.3* 11.6*  HGB 9.7* 10.5* 10.4*  HCT 32.0* 33.2* 33.8*  MCV 80.6 80.8 79.3  PLT 150 172 176   Cardiac Enzymes: No results for input(s): CKTOTAL, CKMB, CKMBINDEX, TROPONINI in the last 168 hours. BNP (last 3 results)  Recent Labs  04/20/15 0340 08/08/15 0543 09/20/15 0500  BNP 32.5 37.2 220.0*    ProBNP (last 3 results) No results for input(s): PROBNP in the last 8760 hours.  CBG:  Recent Labs Lab 09/30/15 1646 09/30/15 2106 10/01/15 0602 10/01/15 0747 10/01/15 1142  GLUCAP 241* 178* 358* 311* 273*    Recent Results (from the past 240 hour(s))  Culture, respiratory (NON-Expectorated)     Status: None   Collection Time: 09/26/15  1:45 PM  Result Value Ref Range Status   Specimen Description TRACHEAL ASPIRATE  Final   Special Requests NONE  Final   Gram Stain   Final    RARE WBC PRESENT, PREDOMINANTLY PMN ABUNDANT GRAM POSITIVE COCCI IN PAIRS IN CHAINS FEW GRAM POSITIVE RODS Performed at [facility]    Culture   Final    Non-Pathogenic Oropharyngeal-type [place] Isolated. Performed at [facility]    Report Status 09/29/2015 FINAL  Final     Studies: No results found.  Scheduled Meds: . antiseptic oral rinse  7 mL Mouth Rinse BID  . arformoterol  15 mcg Nebulization BID  . aspirin  81 mg Oral Daily  . atorvastatin  20 mg Oral QHS  . bisoprolol  5 mg Oral Daily  . budesonide (PULMICORT) nebulizer solution  0.5 mg Nebulization BID  . calcium citrate  200 mg of elemental calcium Oral QID  . cholecalciferol  1,000 Units Oral Daily  . cyanocobalamin  1,500 mcg Oral Daily  . docusate sodium  100 mg Oral BID  . doxycycline  100 mg Oral Q12H  . feeding supplement (GLUCERNA SHAKE)  237 mL Oral TID BM  . fluconazole (DIFLUCAN) IV  100 mg Intravenous Q24H  . fluticasone  2 spray Each Nare Daily  . guaiFENesin  1,200 mg Oral BID  . insulin aspart  0-20 Units Subcutaneous TID WC  . insulin aspart  0-5 Units  Subcutaneous QHS  . insulin aspart  18 Units Subcutaneous TID WC  . insulin glargine  45 Units Subcutaneous Daily  . levalbuterol  1.25 mg Nebulization Q6H  . levothyroxine  137 mcg Oral Daily  . magnesium oxide  400 mg Oral Daily  . methylPREDNISolone (SOLU-MEDROL) injection  80 mg Intravenous Q12H  . montelukast  10 mg Oral QHS  . multivitamin with minerals  1 tablet Oral Daily  . pantoprazole  40 mg Oral BID AC  . rivaroxaban  20 mg Oral Q supper  . sertraline  150 mg Oral QHS  . sodium chloride  10-40 mL Intracatheter Q12H  . sodium chloride  3 mL Intravenous Q12H  . spironolactone  25 mg Oral Daily   Continuous Infusions:   Principal Problem:   COPD with acute exacerbation (HCC) Active Problems:   Primary cancer of right upper lobe of lung (HCC)   Hypothyroidism   DM type 2 (diabetes mellitus, type 2) (HCC)   HLD (hyperlipidemia)   Anxiety state   Sinus tachycardia (HCC)   Sepsis ([place])   DVT (deep venous thrombosis) (HCC)   Unilateral vocal cord paralysis   Long term current use of anticoagulant therapy   Acute on chronic respiratory failure (HCC)   Depression   PE (physical exam), annual   Skin infection   Atrial fibrillation with RVR ([place])   Hypocalcemia   Malignant neoplasm of upper lobe of lung ([place])   DM type 2, goal A1c below 7   Oropharyngeal candidiasis   Janice Merkel MD PhD  [facility] Pager [phone]. If 7PM-7AM, please contact night-coverage at [email or web address], password [facility] 10/01/2015, 1:54 PM  LOS: 12 days

## 2015-10-01 NOTE — Progress Notes (Signed)
Lab values assessed this am Glucose 380. Floor coverage provider paged with result.
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
## 2015-10-01 NOTE — Progress Notes (Addendum)
Pt given scheduled breathing treatment as ordered.  Pt refused chest vest at this time but was willing to do flutter valve.  Pt stated she just wants to rest for remainder of the night and will do vest again in the morning.

## 2015-10-01 NOTE — Progress Notes (Signed)
Physical Therapy Treatment Patient Details Name: Janice Ball MRN: [id number] DOB: Feb 24, 1956 Today's Date: 10/01/2015    History of Present Illness Pt admitted with COPD exacerbation and hx of lung CA, PE and DM, Vocal cord paralysis    PT Comments    Assisted OOB to amb in hallway using rollator with seat.  RA avg 96% and HR 109.  3/4 DOE.  Limited activity tolerance.  Requires extended rest break.  Amb 35 feet Mod Assist.  Very unsteady/drunken gait with poor self correction.  Lateral LOB all planes.  Weak.  Deconditioned.  Extended sitting rest break required before amb back to her room another 35 feet.  Coughing.  Assisted to [facility] then back to bed.  Palliative MD arrived.   Follow Up Recommendations  Home health PT;Supervision - Intermittent (Palliative)     Equipment Recommendations  None recommended by PT    Recommendations for Other Services       Precautions / Restrictions Precautions Precautions: Fall Precaution Comments: monitor O2 Restrictions Weight Bearing Restrictions: No    Mobility  Bed Mobility Overal bed mobility: Needs Assistance Bed Mobility: Supine to Sit     Supine to sit: Supervision     General bed mobility comments: increased time  Transfers Overall transfer level: Needs assistance Equipment used: None Transfers: Sit to/from Stand Sit to Stand: Min guard;Min assist         General transfer comment: slightly unsteady posterior LOB therapist recovered.  assisted from bed and on/off 4 ww.  25% VC's on proper hand placement.  extra physical assist to complete turns due to poor balance.    Ambulation/Gait Ambulation/Gait assistance: Min assist;Mod assist Ambulation Distance (Feet): 70 Feet (35 feet x 2 with one long sitting rest break) Assistive device: 4-wheeled walker Gait Pattern/deviations: Step-to pattern;Step-through pattern;Drifts right/left Gait velocity: decreased   General Gait Details: used rollator with seat for energy  conservation such that pt could sit when fatigued however required extra assist due to balance deficit.  Posterior LOB with delayed/poor self correction.  lateral dift all plances.  Drunken/unsteady gait which therapist corrected Mod Assist.  [place].  Too unsteady to functionally use rollator on her own.  Rec standard RW.     Stairs            Wheelchair Mobility    Modified Rankin (Stroke Patients Only)       Balance                                    Cognition Arousal/Alertness: Awake/alert Behavior During Therapy: WFL for tasks assessed/performed Overall Cognitive Status: Within Functional Limits for tasks assessed                      Exercises      General Comments        Pertinent Vitals/Pain Pain Assessment: No/denies pain    Home Living                      Prior Function            PT Goals (current goals can now be found in the care plan section) Progress towards PT goals: Progressing toward goals    Frequency  Min 3X/week    PT Plan Current plan remains appropriate    Co-evaluation  End of Session Equipment Utilized During Treatment: Gait belt Activity Tolerance: Patient limited by fatigue Patient left: in bed;with call bell/phone within reach     Time: 1450-1520 PT Time Calculation (min) (ACUTE ONLY): 30 min  Charges:  $Gait Training: 8-22 mins $Therapeutic Activity: 8-22 mins                    G Codes:      Rica Koyanagi  PTA [facility]  Acute  Rehab Pager      [phone]

## 2015-10-02 ENCOUNTER — Ambulatory Visit: Payer: Medicare Other | Admitting: Internal Medicine

## 2015-10-02 ENCOUNTER — Other Ambulatory Visit: Payer: Medicare Other

## 2015-10-02 ENCOUNTER — Ambulatory Visit: Payer: Medicare Other

## 2015-10-02 LAB — GLUCOSE, CAPILLARY
GLUCOSE-CAPILLARY: 265 mg/dL — AB (ref 65–99)
Glucose-Capillary: 311 mg/dL — ABNORMAL HIGH (ref 65–99)
Glucose-Capillary: 130 mg/dL — ABNORMAL HIGH (ref 65–99)
Glucose-Capillary: 134 mg/dL — ABNORMAL HIGH (ref 65–99)

## 2015-10-02 NOTE — Progress Notes (Signed)
Occupational Therapy Treatment Patient Details Name: Janice Ball MRN: [id number] DOB: [birth year] Today's Date: 10/02/2015    History of present illness Pt admitted with COPD exacerbation and hx of lung CA, PE and DM, Vocal cord paralysis   OT comments  Pt with good participation this OT visit  Follow Up Recommendations  Home health OT;Supervision/Assistance - 24 hour    Equipment Recommendations  None recommended by OT    Recommendations for Other Services      Precautions / Restrictions Precautions Precautions: Fall Precaution Comments: monitor O2 Restrictions Weight Bearing Restrictions: No       Mobility Bed Mobility Overal bed mobility: Needs Assistance Bed Mobility: Supine to Sit     Supine to sit: Supervision Sit to supine: Supervision      Transfers Overall transfer level: Needs assistance Equipment used: None Transfers: Sit to/from Stand Sit to Stand: Supervision Stand pivot transfers: Supervision                ADL Overall ADL's : Needs assistance/impaired     Grooming: Standing;Min guard       Lower Body Bathing: Min guard;Sit to/from stand       Lower Body Dressing: Min guard;Sit to/from stand   Toilet Transfer: Min Art therapist Details (indicate cue type and reason): lost balance posterior and sat on bed Toileting- Clothing Manipulation and Hygiene: Min guard;Sit to/from stand;Cueing for safety;Cueing for sequencing         General ADL Comments: performed multiple sit to stands as well toilet transfer- pt stated "i am tuckered"                Cognition   Behavior During Therapy: [facility] for tasks assessed/performed Overall Cognitive Status: Within Functional Limits for tasks assessed                                    Pertinent Vitals/ Pain       Pain Assessment: No/denies pain Pain Intervention(s): Monitored during session  Home Living                                              Frequency Min 2X/week     Progress Toward Goals  OT Goals(current goals can now be found in the care plan section)  Progress towards OT goals: Progressing toward goals     Plan Discharge plan remains appropriate       End of Session     Activity Tolerance Patient tolerated treatment well   Patient Left in chair   Nurse Communication Mobility status        Time: 1002-1015 OT Time Calculation (min): 13 min  Charges: OT General Charges $OT Visit: 1 Procedure OT Treatments $Self Care/Home Management : 8-22 mins  Jamal Pavon D 10/02/2015, 11:25 AM

## 2015-10-02 NOTE — Progress Notes (Signed)
Received phone call from Dr. Erlinda Hong, patient's daughter Janice Ball requesting phone call.  Discussed extensively with Dr. Erlinda Hong.   Call placed and discussed with daughter Janice Ball.  Patient lives at home with her daughter Janice Ball. Discussed the patient's current hospitalization course. Discussed about the patient's lung cancer and that at this moment, oncologist recommendations are that the patient is not a candidate for resuming chemotherapy which was essentially non-curative in nature, patient was not a candidate for palliative radiation either. Discussed about pulmonary doctors recommendations for stenting to open up airways. This would have to be accomplished at a tertiary care center. The patient's daughter states that the patient very much wants to be home. She does not believe that the patient would be able to tolerate another procedure. She states she does not have the necessary transportation to take the patient for stenting. Overall, she does not believe that this will significantly contribution to the patient's overall condition  Discussed about appropriate disposition options. Patient's daughter Janice Ball does have caregiver fatigue however does not wish to send the patient to a nursing facility. She states the patient wants to come home and she is leaning towards having the patient come home. Discussed about resuming home health services through [facility] versus addition of hospice services at home. Daughter states she is leaning towards bringing the patient home with hospice. She wishes to keep the patient as comfortable as possible. She states that during the course of this hospitalization, she has noticed how much of a workout it is for the patient to even use the bathroom. She does not believe that the patient would be able to participate significantly in physical therapy.  Plan: Discharge home with hospice Will request for hospice consult to case management.  Patient will likely be a ready  for discharge and 24-48 hours. Daughter wishes to discuss extensively first with hospice nurse directly about how much support hospice can provide at home.

## 2015-10-02 NOTE — Progress Notes (Signed)
[facility] PROGRESS NOTE  Janice Ball HCW:[id number] DOB: 14-Sep-1956 DOA: 09/26/2015 PCP: Janice Chimes, NP  HPI/Brief narrative 59 year old female history of recurrent non-small cell lung cancer on chemotherapy status post radiation been followed by Dr. 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 [name] (ENT) who presented with a productive cough shortness of breath and a skin infection. On admission patient none to have a transient A. fib and went back into her chronic sinus tachycardia. Patient was admitted to the step down unit and being treated for an acute COPD exacerbation and noted to be in sepsis with a skin infection also noted. Patient noted to have significant airway noise. Patient was placed on IV steroids, scheduled nebulizers, empiric IV antibiotics of vancomycin and aztreonam. Patient was pancultured and cultures pending. Sputum Gram stain and culture with moderate [place] consistent with Candida species. Patient placed on Diflucan. IV antibiotics, transitioned to oral antibiotics and IV steroids have been transitioned to oral prednisone. Patient is slowly improving.  Assessment/Plan:   #1 acute on chronic hypoxic respiratory failure, patient was hypoxic 86% on 2liters initial on presentation. Cause post obstuctive pna, copd, stage iv nonsmall cell lung ca.   -Patient remains congested with upper airway noise, not able to clear secretion effectively -Patient with less diffuse wheezing and some coarse breath sounds.  -Influenza PCR negative.  -Continue Xopenex scheduled nebulizers, Mucinex. Continue Pulmicort and Brovana, Claritin, Flonase.  -Continue NT suction as needed. -No significant improvement in secretions, thus Pulmonary was formally consulted. Appreciate input. - Pt documented to have refused chest PT  overnight -Not getting better with treatment, Pulmonology recommended palliative care consult , i have discussed with  Oncology Dr. Suzan Nailer , he agreed with palliative care consult, patient and family also agreed to it. -likely moving toward home hospice, will f/u with palliative care recommendations.  #2 Cellulitis with sepsis present on admit -Patient initially was noted to have a leukocytosis with a white count of 21.6, tachycardic, with a elevated lactate level.  -Suspected may be secondary to skin infection and COPD exacerbation.  - Blood cultures and urine cx unremarkable.  - Sputum Gram stain and culture with moderate yeast consistent with Candida species.  - WBC has since trended down since admission.  - DC'd IV vancomycin. D/C IV aztreonam. For now, continue oral doxycycline for skin infection. Continue Diflucan.  #3 oral pharyngeal candidiasis - Sputum Gram stain and culture with moderate ease consistent with Candida species. - Patient with an acute COPD exacerbation on steroids.  - Patient with a history of recurrent non-small cell lung cancer on chemotherapy and also with a vaginal candidiasis.  -Patient is continued on Diflucan with plans to complete total of 2 weeks of tx  #4 leukocytosis - Leukocytosis had trended down.  - Patient has been pancultured.  - Cultures unremarkable thus far. Continued antibiotics per above  #5 skin infection over right lateral forearm - Bandage around forearm.  - Continue on oral doxycycline.  - Wound care was consulted.  #6 diabetes mellitus type 2, insulin dependent - Hemoglobin A1c was 8.2 on 08/16/2015. - With concurrent steroids, glucose noted to be in the 300's - continue to adjust Lantus and pre-meal insulin   #7 hypocalcemia - Continue calcium citrate 500 mg 4 times a day with water and vitamin D 1000 international units by mouth daily with water.   #8 sinus tachycardia -  Continued on Lopressor to 25 mg by mouth twice a day. 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  - Continue Synthroid.  #11 hyperlipidemia - LDL was 95 On 04/21/2015 - Continue Lipitor.  #12 depression/anxiety - Stable. No homicidal or suicidal ideations.  - Continue Zoloft and Ativan.  #13 paroxysmal/Transient new onset A. fib with RVR >>>>>SINUS TACHYCARDIA - Likely secondary to COPD exacerbation.  - CHA2DS2Vasc score is 3.  - Patient on anticoagulation per above.  - Continue lopressor 25 mg twice a day and continue to titrate as needed.   #14 recurrent non-small cell lung cancer - patient on concurrent chemoradiation.  - Dr Earlie Server did sto by for social visit.  #15 vaginal candidiasis - Status post Diflucan 150 mg 1, now continued on diflucan per above  #16 prophylaxis - PPI for GI prophylaxis.  Janice Ball for DVT prophylaxis.  Code Status: DNR Family Communication: Pt and daughter in room Disposition Plan: pending palliative care input   Consultants:  Pulmonary/critical care  Oncology Dr. [name] over the phone on 12/14  Palliative care  Procedures:  none  Antibiotics: Anti-infectives    Start     Dose/Rate Route Frequency Ordered Stop   09/23/15 1800  fluconazole (DIFLUCAN) IVPB 100 mg     100 mg 50 mL/hr over 60 Minutes Intravenous Every 24 hours 09/22/15 1708     09/22/15 1800  fluconazole (DIFLUCAN) IVPB 200 mg     200 mg 100 mL/hr over 60 Minutes Intravenous  Once 09/22/15 1708 09/22/15 1909   09/22/15 1000  doxycycline (VIBRA-TABS) tablet 100 mg     100 mg Oral Every 12 hours 09/22/15 0840     09/21/15 2000  fluconazole (DIFLUCAN) tablet 150 mg     150 mg Oral  Once 09/21/15 1815 09/21/15 1951   09/20/15 0200  aztreonam (AZACTAM) 2 g in dextrose 5 % 50 mL IVPB  Status:  Discontinued     2 g 100 mL/hr over 30 Minutes Intravenous 3 times per day 09/28/2015 2259  09/23/15 0913   10/01/2015 2359  vancomycin (VANCOCIN) IVPB 750 mg/150 ml premix  Status:  Discontinued     750 mg 150 mL/hr over 60 Minutes Intravenous Every 12 hours 10/17/2015 2258 09/22/15 0840   09/25/2015 1545  cefTRIAXone (ROCEPHIN) 1 g in dextrose 5 % 50 mL IVPB     1 g 100 mL/hr over 30 Minutes Intravenous  Once 10/13/2015 1533 10/17/2015 1641   10/14/2015 1545  azithromycin (ZITHROMAX) 500 mg in dextrose 5 % 250 mL IVPB     500 mg 250 mL/hr over 60 Minutes Intravenous  Once 09/25/2015 1533 09/21/2015 1759      HPI/Subjective: Overall frail, continued upper airway sounds, not able to clear secretion effectively  Objective: Filed Vitals:   10/02/15 0850 10/02/15 0858 10/02/15 1015 10/02/15 1440  BP:   139/70   Pulse:   110   Temp:      TempSrc:      Resp:      Height:      Weight:      SpO2: 96% 96%  99%    Intake/Output Summary (Last 24 hours) at 10/02/15 1458 Last data filed at 10/02/15 1234  Gross per 24 hour  Intake    340 ml  Output   1600 ml  Net  -1260 ml   Filed  Weights   09/20/15 0100 09/22/15 0400 09/23/15 0500  Weight: 144 lb 2.9 oz (65.4 kg) 144 lb 10 oz (65.6 kg) 146 lb 2.6 oz (66.3 kg)    Exam:   General:  Awake, laying in bed, frail, weak cough,   Cardiovascular: regular, s1, s2  Respiratory: hoarse voice, coarse breath sounds, normal resp effort  Abdomen: soft, nondistended  Musculoskeletal: perfused, no clubbing, no cyanosis  Data Reviewed: Basic Metabolic Panel:  Recent Labs Lab 09/29/15 0256 10/01/15 0426  NA 134* 133*  K 5.1 5.0  CL 98* 94*  CO2 28 31  GLUCOSE 375* 380*  BUN 27* 20  CREATININE 0.69 0.54  CALCIUM 8.2* 7.9*   Liver Function Tests: No results for input(s): AST, ALT, ALKPHOS, BILITOT, PROT, ALBUMIN in the last 168 hours. No results for input(s): LIPASE, AMYLASE in the last 168 hours. No results for input(s): AMMONIA in the last 168 hours. CBC:  Recent Labs Lab 09/29/15 0256 10/01/15 0426  WBC 13.3* 11.6*  HGB  10.5* 10.4*  HCT 33.2* 33.8*  MCV 80.8 79.3  PLT 172 176   Cardiac Enzymes: No results for input(s): CKTOTAL, CKMB, CKMBINDEX, TROPONINI in the last 168 hours. BNP (last 3 results)  Recent Labs  04/20/15 0340 08/08/15 0543 09/20/15 0500  BNP 32.5 37.2 220.0*    ProBNP (last 3 results) No results for input(s): PROBNP in the last 8760 hours.  CBG:  Recent Labs Lab 10/01/15 1142 10/01/15 1703 10/01/15 2051 10/02/15 0742 10/02/15 1151  GLUCAP 273* 190* 162* 311* 265*    Recent Results (from the past 240 hour(s))  Culture, respiratory (NON-Expectorated)     Status: None   Collection Time: 09/26/15  1:45 PM  Result Value Ref Range Status   Specimen Description TRACHEAL ASPIRATE  Final   Special Requests NONE  Final   Gram Stain   Final    RARE WBC PRESENT, PREDOMINANTLY PMN ABUNDANT GRAM POSITIVE COCCI IN PAIRS IN CHAINS FEW GRAM POSITIVE RODS Performed at [facility]    Culture   Final    Non-Pathogenic Oropharyngeal-type [place]ora Isolated. Performed at [facility]    Report Status 09/29/2015 FINAL  Final     Studies: No results found.  Scheduled Meds: . antiseptic oral rinse  7 mL Mouth Rinse BID  . arformoterol  15 mcg Nebulization BID  . aspirin  81 mg Oral Daily  . atorvastatin  20 mg Oral QHS  . bisoprolol  5 mg Oral Daily  . budesonide (PULMICORT) nebulizer solution  0.5 mg Nebulization BID  . calcium citrate  200 mg of elemental calcium Oral QID  . cholecalciferol  1,000 Units Oral Daily  . cyanocobalamin  1,500 mcg Oral Daily  . docusate sodium  100 mg Oral BID  . doxycycline  100 mg Oral Q12H  . feeding supplement (GLUCERNA SHAKE)  237 mL Oral TID BM  . fluconazole (DIFLUCAN) IV  100 mg Intravenous Q24H  . fluticasone  2 spray Each Nare Daily  . guaiFENesin  1,200 mg Oral BID  . insulin aspart  0-20 Units Subcutaneous TID WC  . insulin aspart  0-5 Units Subcutaneous QHS  . insulin aspart  18 Units Subcutaneous TID WC  .  insulin glargine  45 Units Subcutaneous Daily  . levalbuterol  1.25 mg Nebulization Q6H  . levothyroxine  137 mcg Oral Daily  . magnesium oxide  400 mg Oral Daily  . methylPREDNISolone (SOLU-MEDROL) injection  40 mg Intravenous Q12H  . montelukast  10  mg Oral QHS  . multivitamin with minerals  1 tablet Oral Daily  . pantoprazole  40 mg Oral BID AC  . rivaroxaban  20 mg Oral Q supper  . sertraline  150 mg Oral QHS  . sodium chloride  10-40 mL Intracatheter Q12H  . sodium chloride  3 mL Intravenous Q12H  . spironolactone  25 mg Oral Daily   Continuous Infusions:   Principal Problem:   COPD with acute exacerbation (HCC) Active Problems:   Primary cancer of right upper lobe of lung (HCC)   Hypothyroidism   DM type 2 (diabetes mellitus, type 2) (HCC)   HLD (hyperlipidemia)   Anxiety state   Sinus tachycardia (HCC)   Sepsis ([place])   DVT (deep venous thrombosis) (HCC)   Unilateral vocal cord paralysis   Long term current use of anticoagulant therapy   Acute on chronic respiratory failure (HCC)   Depression   PE (physical exam), annual   Skin infection   Atrial fibrillation with RVR ([place])   Hypocalcemia   Malignant neoplasm of upper lobe of lung ([place])   DM type 2, goal A1c below 7   Oropharyngeal candidiasis   COPD exacerbation ([place])   Encounter for palliative care   Arlando Leisinger MD PhD  [facility] Pager [phone]. If 7PM-7AM, please contact night-coverage at [email or web address], password [facility] 10/02/2015, 2:58 PM  LOS: 13 days

## 2015-10-02 NOTE — Progress Notes (Signed)
Inpatient Diabetes Program Recommendations  AACE/ADA: New Consensus Statement on Inpatient Glycemic Control (2015)  Target Ranges:  Prepandial:   less than 140 mg/dL      Peak postprandial:   less than 180 mg/dL (1-2 hours)      Critically ill patients:  140 - 180 mg/dL   Review of Glycemic Control  Diabetes history: DM 2 Outpatient Diabetes medications: Amaryl 4 mg Daily, Metformin 1,000 mg BID Current orders for Inpatient glycemic control: Lantus 45 units, Novolog Resistant + HS + Novolog 18 units TID meal coverage  Possible d/c today per pt request in palliative care team note  Inpatient Diabetes Program Recommendations: Insulin - Basal: Patient received a total of 89 units of Novolog yesterday during the 24 hours. Fasting glucose is in the 300's the past 2 mornings. Please consider increasing basal insulin significantly to 55-60 units.  Thanks,  Tama Headings RN, MSN, [facility] Inpatient Diabetes Coordinator Team Pager [phone] (8a-5p)

## 2015-10-03 ENCOUNTER — Inpatient Hospital Stay (HOSPITAL_COMMUNITY): Payer: Medicare Other

## 2015-10-03 DIAGNOSIS — Z66 Do not resuscitate: Secondary | ICD-10-CM

## 2015-10-03 DIAGNOSIS — E038 Other specified hypothyroidism: Secondary | ICD-10-CM

## 2015-10-03 LAB — GLUCOSE, CAPILLARY
GLUCOSE-CAPILLARY: 172 mg/dL — AB (ref 65–99)
Glucose-Capillary: 258 mg/dL — ABNORMAL HIGH (ref 65–99)
Glucose-Capillary: 247 mg/dL — ABNORMAL HIGH (ref 65–99)
Glucose-Capillary: 386 mg/dL — ABNORMAL HIGH (ref 65–99)

## 2015-10-03 MED ORDER — FUROSEMIDE 40 MG PO TABS: 40.0000 mg | ORAL_TABLET | Freq: Every day | ORAL | Status: AC | PRN

## 2015-10-03 MED ORDER — GLUCERNA SHAKE PO LIQD: 237.0000 mL | Freq: Three times a day (TID) | ORAL | Status: AC

## 2015-10-03 MED ORDER — OXYCODONE HCL 5 MG PO TABS: 5.0000 mg | ORAL_TABLET | ORAL | Status: AC | PRN

## 2015-10-03 MED ORDER — DOXYCYCLINE HYCLATE 100 MG PO TABS: 100.0000 mg | ORAL_TABLET | Freq: Two times a day (BID) | ORAL | Status: AC

## 2015-10-03 MED ORDER — BISOPROLOL FUMARATE 5 MG PO TABS: 5.0000 mg | ORAL_TABLET | Freq: Every day | ORAL | Status: AC

## 2015-10-03 MED ORDER — INSULIN GLARGINE 100 UNIT/ML ~~LOC~~ SOLN: 45.0000 [IU] | Freq: Every day | SUBCUTANEOUS | Status: AC

## 2015-10-03 MED ORDER — LORAZEPAM 1 MG PO TABS: 1.0000 mg | ORAL_TABLET | Freq: Three times a day (TID) | ORAL | Status: AC | PRN

## 2015-10-03 MED ORDER — HEPARIN SOD (PORK) LOCK FLUSH 100 UNIT/ML IV SOLN
500.0000 [IU] | INTRAVENOUS | Status: AC | PRN
  Administered 2015-10-03: 500 [IU]

## 2015-10-03 MED ORDER — INSULIN ASPART 100 UNIT/ML ~~LOC~~ SOLN: 18.0000 [IU] | Freq: Three times a day (TID) | SUBCUTANEOUS | Status: AC

## 2015-10-03 MED ORDER — PREDNISONE 10 MG PO TABS: ORAL_TABLET | ORAL | Status: AC

## 2015-10-03 NOTE — Discharge Summary (Addendum)
Death Summary  Janice Ball:[id number] DOB: 05-07-1956  PCP: Janice Chimes, NP  Admit date: 28-Sep-2015 Time of death:  13:05 pm on 10-13-2015  Time spent: >20mns   Discharge Diagnoses:  Active Hospital Problems   Diagnosis Date Noted  . COPD with acute exacerbation ([facility] 05/09/2009  . COPD exacerbation ([facility]   . Encounter for palliative care   . Oropharyngeal candidiasis 09/23/2015  . DM type 2, goal A1c below 7   . Hypocalcemia 09/20/2015  . Malignant neoplasm of upper lobe of lung ([facility]   . Depression 1Dec 11, 2016 . PE (physical exam), annual 1Dec 11, 2016 . Skin infection 12016-12-11 . Atrial fibrillation with RVR ([facility] 1Dec 11, 2016 . Acute on chronic respiratory failure ([place] 08/07/2015  . Long term current use of anticoagulant therapy 07/26/2015  . Unilateral vocal cord paralysis 07/24/2015  . DVT (deep venous thrombosis) ([facility] 07/07/2015  . Sepsis ([facility] 01/18/2015  . Sinus tachycardia ([facility] 12/29/2013  . Primary cancer of right upper lobe of lung ([facility] 06/14/2009  . Hypothyroidism 06/15/2007  . HLD (hyperlipidemia) 06/15/2007  . DM type 2 (diabetes mellitus, type 2) ([facility] 06/15/2007  . Anxiety state 06/15/2007    Resolved Hospital Problems   Diagnosis Date Noted Date Resolved  No resolved problems to display.     Filed Weights   09/20/15 0100 09/22/15 0400 09/23/15 0500  Weight: 144 lb 2.9 oz (65.4 kg) 144 lb 10 oz (65.6 kg) 146 lb 2.6 oz (66.3 kg)    History of present illness:  Janice KINTZis a 59y.o. female with PMH of recurrent non-small cell lung cancer (on on chemotherapy and s/p of radiation, follows with Dr. MJulien Nordmann, SVC syndrome, PE and DVT on Xarelto, COPD, type 2 diabetes mellitus, hypothyroidism and anxiety, and left vocal cord paralysis with weak breathing for which she underwent left medialization thyroplasty on 10/4 by Dr. NLucia Gaskins(ENT), who presents with productive cough, SOB and the skin infection.  Patient reports that she started  having worsening shortness of breath since this morning. She has productive cough with brownish colored sputum production. She had some chest pain earlier, which has resolved currently. She has skin infection over right lateral forearm, for which she was given prescription for doxycycline yestoday. She has some pus coming out from the infected area. No fever, chills, abdominal pain, diarrhea, symptoms of UTI or unilateral weakness. She was found to have atrial fibrillation with RVR on initial EKG in ED, which is converted to sinus tachycardia on repeated EKG after treated with one dose of metoprolol and IV cardiazem.  In ED, patient was found to have WBC 21.6, length 53.10 1.95, temperature 99.7, tachycardia, renal function okay. Chest x-ray has no infiltration. Patient's and admitted to inpatient for further evaluation and treatment.  Hospital Course:  Principal Problem:   COPD with acute exacerbation ([facility] Active Problems:   Primary cancer of right upper lobe of lung (HCC)   Hypothyroidism   DM type 2 (diabetes mellitus, type 2) (HCC)   HLD (hyperlipidemia)   Anxiety state   Sinus tachycardia (HCC)   Sepsis ([facility]   DVT (deep venous thrombosis) (HCC)   Unilateral vocal cord paralysis   Long term current use of anticoagulant therapy   Acute on chronic respiratory failure (HCC)   Depression   PE (physical exam), annual   Skin infection   Atrial fibrillation with RVR (HCC)   Hypocalcemia   Malignant neoplasm of upper lobe of lung ([facility]   DM type 2, goal A1c below  7   Oropharyngeal candidiasis   COPD exacerbation ([place])   Encounter for palliative care  #1 acute on chronic hypoxic respiratory failure, patient was hypoxic 86% on 2liters initial on presentation. Cause post obstuctive pna, copd, stage iv nonsmall cell lung ca.   --Influenza PCR negative.  -Pulmonary was formally consulted - Pt documented to have refused chest PT overnight -Not getting better with treatment, Pulmonology  recommended palliative care consult , i have discussed with Oncology Dr. Suzan Nailer , he agreed with palliative care consult, patient and family also agreed to it. -appreciate palliative care input, patient is to go home with home hospice.  #2 Cellulitis with sepsis present on admit -Patient initially was noted to have a leukocytosis with a white count of 21.6, tachycardic, with a elevated lactate level.  -Suspected may be secondary to skin infection and COPD exacerbation.  - Blood cultures and urine cx unremarkable.  - Sputum Gram stain and culture with moderate yeast consistent with Candida species, treated with two weeks of diflucan - WBC has since trended down since admission.  -she was treated with iv  Vancomycin and IV aztreonam, then transitioned to oral doxycycline and finished treatment course.    #3 oral pharyngeal candidiasis - Sputum Gram stain and culture with moderate ease consistent with Candida species. - Patient with an acute COPD exacerbation on steroids.  - Patient with a history of recurrent non-small cell lung cancer on chemotherapy and also with a vaginal candidiasis.  -treated with  Diflucan for 2 weeks, finished course.  #4 leukocytosis - Leukocytosis had trended down.  - Patient has been pancultured.  - treated with abx and diflucan as above, leukocytosis trending down.  #5 skin infection over right lateral forearm - Wound care was consulted. Healing, treated with abx.  #6 diabetes mellitus type 2, insulin dependent - Hemoglobin A1c was 8.2 on 08/16/2015. - With concurrent steroids, glucose noted to be in the 300's - treated with Lantus and pre-meal insulin   #7 hypocalcemia - Continue calcium citrate 500 mg 4 times a day with water and vitamin D 1000 international units by mouth daily with water.   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  - Continue Synthroid.  #11 hyperlipidemia - LDL was 95 On 04/21/2015 - Continue Lipitor.  #12 depression/anxiety - Stable. No homicidal or suicidal ideations.  - Continue Zoloft and Ativan.  #13 paroxysmal/Transient new onset A. fib with RVR >>>>>SINUS TACHYCARDIA - Likely secondary to COPD exacerbation.  - CHA2DS2Vasc score is 3.  - Patient on anticoagulation per above.  - treated with lopressor 25 mg twice a day and continue to titrate as needed.   #14 recurrent non-small cell lung cancer - patient on concurrent chemoradiation.  - Dr Earlie Server did stop by for social visit.  #15 vaginal candidiasis - treated with diflucan per above  #70 chronic diastolic chf: euvolemic at discharge, d/c spironolectone, due to borderline high potassium around 5, change to lasix '40mg'$  po daily prn for edema.  #17 hypomagnesemia: mag replaced  Patient 's planned discharge to home with home hospice on 12/16 was delayed, this am i saw patient she was alert and happy to go home, she is getting ready to be discharged to home with home hospice after lunch, however, she choked on her lunch, heimlich maneuver  were performed without success, patient became apnea and passed away on 13:05.    Code Status: DNR Family Communication:  daughter in room    Consultants:  Pulmonary/critical care  Oncology Dr. [name] over the phone on 12/14  Palliative care  Procedures:  none  Antibiotics: Anti-infectives       Iv vanc/azetreonam doxycycline   Allergies  Allergen Reactions  . Carboplatin Rash    Skin test reaction  . Sulfonamide Derivatives Rash  . Codeine Other (See Comments)    hallucinations  . Dilaudid [Hydromorphone Hcl] Nausea And Vomiting and Other (See Comments)    Room started spinning.  Pt has been okay with low doses and nausea meds administered first  . Keflex [Cephalexin]  Itching    Rash-arms, chest  . Penicillins Other (See Comments)    Unknown childhood reaction Has patient had a PCN reaction causing immediate rash, facial/tongue/throat swelling, SOB or lightheadedness with hypotension: unknown Has patient had a PCN reaction causing severe rash involving mucus membranes or skin necrosis: unknown Has patient had a PCN reaction that required hospitalization unknown Has patient had a PCN reaction occurring within the last 10 years: no If all of the above answers are "NO", then may proceed with Cephalosporin use.   . Vicodin [Hydrocodone-Acetaminophen] Rash      The results of significant diagnostics from this hospitalization (including imaging, microbiology, ancillary and laboratory) are listed below for reference.    Significant Diagnostic Studies: Dg Lumbar Spine Complete  October 27, 2015  CLINICAL DATA:  Left lower back pain since falling while going to the bathroom in her hospital room 2-3 hours ago. Patient states she lost her balance. EXAM: LUMBAR SPINE - COMPLETE 4+ VIEW COMPARISON:  Abdominal CT 08/16/2015 FINDINGS: The alignment is maintained. Vertebral body heights are normal. There is no listhesis. The posterior elements are intact. Mild disc space narrowing at L2-L3 with endplate spurring, stable. No fracture. Sacroiliac joints are symmetric and normal. Vascular calcifications of the abdominal aorta. IMPRESSION: Mild degenerative change in the lumbar spine without acute fracture or subluxation. Electronically Signed   By: Jeb Levering M.D.   On: 10-27-15 01:28   Ct Chest W Contrast  09/26/2015  CLINICAL DATA:  Subsequent encounter for recurrent non-small cell lung cancer EXAM: CT CHEST WITH CONTRAST TECHNIQUE: Multidetector CT imaging of the chest was performed during intravenous contrast administration. CONTRAST:  60m OMNIPAQUE IOHEXOL 300 MG/ML  SOLN COMPARISON:  08/16/2015. FINDINGS: Mediastinum / Lymph Nodes: There is no axillary  lymphadenopathy. Confluent soft tissue attenuation in the upper mediastinum is again noted. The abnormal soft tissue encases the superior vena cava, trachea/carina, and right upper lobe pulmonary arteries. SVC stent device again noted. Right Port-A-Cath tip is positioned near the junction of the IVC/RA. Soft tissue fullness appears slightly progressed in the subcarinal station, but overall, there is not been a substantial interval change. Heart size is normal. No pericardial effusion. Coronary artery calcification is noted. Lungs / Pleura: Right upper lobe collapse is again noted. Abnormal soft tissue in the right hilum generates mass effect on the bronchus intermedius and both mainstem bronchi. Left lung is clear. No evidence for substantial pleural effusion. Upper Abdomen: Visualized portions of the liver and spleen are unremarkable. Tiny right adrenal nodule measures 7 mm today compared to 10 mm previously. MSK / Soft Tissues: Bone windows reveal no worrisome lytic or sclerotic osseous lesions. IMPRESSION: 1. No substantial interval change and appearance of the abnormal soft tissue attenuation in the mediastinum. This is compatible with reported  history of recurrent non-small cell lung cancer and mass-effect on the airways and right main pulmonary artery is stable. 2. Stent device again noted in the superior vena cava. Patency of the stent cannot be confirmed on today's study. 3. Interval decrease in size of a tiny right adrenal nodule. Electronically Signed   By: Misty Stanley M.D.   On: 09/26/2015 17:07   Dg Chest Port 1 View  09/29/2015  CLINICAL DATA:  Increased shortness of breath, history lung cancer currently undergoing chemotherapy, COPD, diabetes mellitus, former smoker EXAM: PORTABLE CHEST 1 VIEW COMPARISON:  Portable exam 1311 hours compared to 09/24/2015 FINDINGS: RIGHT jugular Port-A-Cath with tip projecting over cavoatrial junction. Stable heart size and pulmonary vascularity. SVC stent present.  Persistent RIGHT hilar/mediastinal enlargement due to tumor as noted on interval CT. Atherosclerotic calcification aorta. Chronic elevation RIGHT of RIGHT diaphragm stable with volume loss in RIGHT hemi thorax. No new infiltrate, pleural effusion or pneumothorax. IMPRESSION: Persistent RIGHT hilar/mediastinal changes with volume loss in RIGHT hemi thorax. No acute infiltrate. Electronically Signed   By: Lavonia Dana M.D.   On: 09/29/2015 14:06   Dg Chest Port 1 View  09/24/2015  CLINICAL DATA:  Weakness.  Community acquired pneumonia. EXAM: PORTABLE CHEST 1 VIEW COMPARISON:  September 21, 2015. FINDINGS: Normal cardiac silhouette. Right internal jugular Port-A-Cath is unchanged with distal tip in expected position of cavoatrial junction. Stent is again noted in expected position of the SVC. No pneumothorax or significant pleural effusion is noted. Left lung is clear. Stable right perihilar and peritracheal opacity is noted concerning for adenopathy with associated atelectasis. Bony thorax is intact. IMPRESSION: Stable right perihilar and peritracheal opacity is noted most consistent with adenopathy and associated atelectasis. No significant changes noted compared to prior exam. Electronically Signed   By: Marijo Conception, M.D.   On: 09/24/2015 11:20   Dg Chest Port 1 View  09/21/2015  CLINICAL DATA:  Shortness of breath. History of COPD and lung cancer. EXAM: PORTABLE CHEST 1 VIEW COMPARISON:  10/06/2015 and 08/19/2015. FINDINGS: 0900 hours. The right IJ Port-A-Cath tip appears unchanged near the SVC right atrial junction. SVC stent noted. There is stable right paratracheal and right hilar fullness from recent CT. There is stable volume loss in the right hemithorax, although the partial right upper lobe collapse demonstrated on CT does appear improved. No new airspace disease, significant pleural effusion or pneumothorax. IMPRESSION: No significant change in appearance of the chest. The right upper lobe does  appear partially re-expanded compared with CT of 5 weeks ago. Electronically Signed   By: Richardean Sale M.D.   On: 09/21/2015 10:21   Dg Chest Port 1 View  09/25/2015  CLINICAL DATA:  Shortness of breath with congestion today. History of COPD and lung cancer. Initial encounter. EXAM: PORTABLE CHEST 1 VIEW COMPARISON:  Radiographs 08/19/2015.  CT 08/16/2015. FINDINGS: 1549 hours. Mild patient rotation to the right. There is stable volume loss in the right hemithorax with partial right upper lobe collapse. There is stable right paratracheal, right hilar and subcarinal fullness. Right IJ Port-A-Cath tip is unchanged near the SVC right atrial junction. SVC stent is noted. The left lung is clear. There is no pleural effusion or pneumothorax. IMPRESSION: No significant change in the appearance of the chest is demonstrated. There is stable partial right upper lobe collapse status post SVC stenting for lung cancer. Electronically Signed   By: Richardean Sale M.D.   On: 09/21/2015 16:08    Microbiology: Recent Results (from the  past 240 hour(s))  Culture, respiratory (NON-Expectorated)     Status: None   Collection Time: 09/26/15  1:45 PM  Result Value Ref Range Status   Specimen Description TRACHEAL ASPIRATE  Final   Special Requests NONE  Final   Gram Stain   Final    RARE WBC PRESENT, PREDOMINANTLY PMN ABUNDANT GRAM POSITIVE COCCI IN PAIRS IN CHAINS FEW GRAM POSITIVE RODS Performed at [facility]    Culture   Final    Non-Pathogenic Oropharyngeal-type Flora Isolated. Performed at [facility]    Report Status 09/29/2015 FINAL  Final     Labs: Basic Metabolic Panel:  Recent Labs Lab 09/29/15 0256 10/01/15 0426  NA 134* 133*  K 5.1 5.0  CL 98* 94*  CO2 28 31  GLUCOSE 375* 380*  BUN 27* 20  CREATININE 0.69 0.54  CALCIUM 8.2* 7.9*   Liver Function Tests: No results for input(s): AST, ALT, ALKPHOS, BILITOT, PROT, ALBUMIN in the last 168 hours. No results for  input(s): LIPASE, AMYLASE in the last 168 hours. No results for input(s): AMMONIA in the last 168 hours. CBC:  Recent Labs Lab 09/29/15 0256 10/01/15 0426  WBC 13.3* 11.6*  HGB 10.5* 10.4*  HCT 33.2* 33.8*  MCV 80.8 79.3  PLT 172 176   Cardiac Enzymes: No results for input(s): CKTOTAL, CKMB, CKMBINDEX, TROPONINI in the last 168 hours. BNP: BNP (last 3 results)  Recent Labs  04/20/15 0340 08/08/15 0543 09/20/15 0500  BNP 32.5 37.2 220.0*    ProBNP (last 3 results) No results for input(s): PROBNP in the last 8760 hours.  CBG:  Recent Labs Lab 10/02/15 2042 10/03/15 0818 10/03/15 1212 10/03/15 1733 10/03/15 2323  GLUCAP 134* 258* 247* 172* 386*       SignedFlorencia Reasons MD, PhD  [facility] 10-24-2015, 10:35 AM

## 2015-10-03 NOTE — Progress Notes (Signed)
Patient fell around 11pm. No injury noted. Vital signs were stable. MD was was notified. Will continue to monitor.

## 2015-10-03 NOTE — Progress Notes (Signed)
Pt refused flutter and chest vest. She is going home.

## 2015-10-03 NOTE — Progress Notes (Signed)
Notified by Oletta Darter CMRN of pt./family request for [facility] at [facility] after discharge.  Writer spoke with pt's daughter on the phone earlier today and met with pt. and her mother at the bedside. Education was initiated  related to hospice philosophy, services and team approach to care. Family voiced understanding of information provided. Per discussion plan is for discharge to home by personal vehicle with daughter this evening.  Please send signed and completed DNR form home with pt./family. Pt. will need prescriptions for discharge comfort medications.  DME needs discussed and pt. currently has a w/c in the home. Family requests O2, suction, nebulizer, bed with full rails, BSC , table and a walker with wheels for delivery to the home today. [facility] equipment manager Jewel Ysidro Evert notified and has contacted [facility] to arrange delivery to the home. The home address has been verified and Daughter, Pamala Hurry is the contact person to be called to arrange time of delivery.  [facility] aware of above. [facility] inforrmation and contact numbers have been given to the daughter during the visit. Above information shared with Oletta Darter, [place] CMRN. Please call with any questions.  [facility] Liaison [phone]

## 2015-10-03 NOTE — Progress Notes (Signed)
Pt using flutter instead of chest vest

## 2015-10-03 NOTE — Progress Notes (Signed)
Pt with pt and daughter Pamala Hurry [phone] concerning Home with Hospice, she selected [facility].  Referral called [phone].

## 2015-10-04 LAB — GLUCOSE, CAPILLARY
GLUCOSE-CAPILLARY: 192 mg/dL — AB (ref 65–99)
Glucose-Capillary: 76 mg/dL (ref 65–99)

## 2015-10-09 ENCOUNTER — Ambulatory Visit: Payer: Medicare Other

## 2015-10-09 ENCOUNTER — Other Ambulatory Visit: Payer: Medicare Other

## 2015-10-09 ENCOUNTER — Ambulatory Visit: Payer: Medicare Other | Admitting: Internal Medicine

## 2015-10-16 ENCOUNTER — Other Ambulatory Visit: Payer: Medicare Other

## 2015-10-16 ENCOUNTER — Ambulatory Visit: Payer: Medicare Other

## 2015-10-19 NOTE — Progress Notes (Signed)
Discharge was delayed due to logistic issue ( hospice deliver home medical equipment late), will discharge home today.

## 2015-10-19 NOTE — Care Management (Signed)
CM spoke with Hospice and [facility]. Hospice is aware of patient passing. They are available to assist the family with bereavement care. Presenter, broadcasting BSN CCM

## 2015-10-19 NOTE — Progress Notes (Signed)
CPT not giving. RT got in report that CPT was not helping patient and irritated patient. RT will continue to monitor.

## 2015-10-19 NOTE — Progress Notes (Signed)
CPT not done. Patient has been refusing. Patient to be discharged today.

## 2015-10-19 NOTE — Progress Notes (Signed)
   10/03/15 2318  What Happened  Was fall witnessed? No  Was patient injured? Unsure  Patient found on floor  Found by Staff-comment  Stated prior activity ambulating-unassisted  Follow Up  MD notified yes  Time MD notified 2330 (approximately)  Family notified Yes-comment  Time family notified 2340 (approximately)  Additional tests Yes-comment (Xray)  Progress note created (see row info) Yes  Adult Fall Risk Assessment  Risk Factor Category (scoring not indicated) History of more than one fall within 6 months before admission (document High fall risk) (second fall in 6 months)  Patient's Fall Risk High Fall Risk (>13 points)  Adult Fall Risk Interventions  Required Bundle Interventions *See Row Information* High fall risk - low, moderate, and high requirements implemented  Additional Interventions Fall risk signage;Secure all tubes/drains;Individualized elimination schedule;Use of appropriate toileting equipment (bedpan, BSC, etc.)  Fall with Injury Screening  Risk For Fall Injury- See Row Information  F;Nurse judgement  Intervention(s) for 2 or more risk criteria identified Gait Belt  Vitals  Temp Source Oral  BP 105/67 mmHg  Pulse Rate 100  Resp (!) 24  Oxygen Therapy  SpO2 99 %  O2 Device Nasal Cannula  O2 Flow Rate (L/min) 1 L/min  Pain Assessment  Pain Assessment 0-10  Pain Score 5  Pain Type Acute pain  Pain Location Back  Pain Orientation Lower  Pain Descriptors / Indicators Aching  Pain Onset Gradual  Patients Stated Pain Goal 2  Pain Intervention(s) RN made aware  PCA/Epidural/Spinal Assessment  Respiratory Pattern Regular;Labored  Neurological  Neuro (WDL) WDL  Level of Consciousness Alert  Orientation Level Oriented X4  Cognition Appropriate at baseline  Speech Other (Comment)  Pupil Assessment  Yes  R Pupil Size (mm) 4  R Pupil Shape Round  R Pupil Reaction Brisk  L Pupil Size (mm) 4  L Pupil Shape Round  L Pupil Reaction Brisk  R Hand Grip  Strong  L Hand Grip Strong  Right Pronator Drift Absent  Left Pronator Drift Absent  R Foot Dorsiflexion Strong  L Foot Dorsiflexion Strong  R Foot Plantar Flexion Strong  L Foot Plantar Flexion Strong  RUE Motor Response Purposeful movement;Responds to commands  RUE Sensation Full sensation  RUE Motor Strength 5  LUE Motor Response Purposeful movement;Responds to commands  LUE Sensation Full sensation  LUE Motor Strength 5  RLE Motor Response Purposeful movement;Responds to commands  RLE Sensation Full sensation  RLE Motor Strength 5  LLE Motor Response Purposeful movement;Responds to commands  LLE Sensation Full sensation  LLE Motor Strength 5

## 2015-10-19 NOTE — Care Management Note (Signed)
Case Management Note  Patient Details  Name: [name] MRN: [id number] Date of Birth: Nov 10, 1955          Action/Plan: Contacted [facility] DME rep to pick up portable office tank from unit.    Expected Discharge Date:  10/28/2015               Expected Discharge Plan:  Expired  In-House Referral:  NA  Discharge planning Services  CM Consult  Post Acute Care Choice:    Choice offered to:  NA  DME Arranged:  N/A DME Agency:  NA  HH Arranged:  NA HH Agency:  NA  Status of Service:  Completed, signed off  Medicare Important Message Given:  Yes Date Medicare IM Given:    Medicare IM give by:    Date Additional Medicare IM Given:    Additional Medicare Important Message give by:     If discussed at [place] of Stay Meetings, dates discussed:    Additional Comments:  Erenest Rasher, RN 10/05/2015, 10:53 AM

## 2015-10-19 NOTE — Progress Notes (Signed)
Patient was eating lunch when we arrived in the room to get her ready for discharge. She was finished up her pot roast when she began choking on her food. We quickly stood her up and performed the heimlich maneuver. A code was called at the same time. A MD was walking by as we were doing the heimlich maneuver. We checked oxygen levels and they were in the 60's-70's. She nodded to the MD that she was breathing when prompted. We got up some food and then the patient lost consciousness. She fell to the bed and we took out her teeth and swept her mouth for food. A piece of food was found and was removed. We felt for a pulse and there was not one. Patient was a DNR. Daughter was waiting downstairs, so we went a got her and explained what happened. Dr. Erlinda Hong notified. [name] donor and ME called. Documentation filled out. Safety Zone filled out per [facility].

## 2015-10-19 NOTE — Progress Notes (Signed)
Called to pt's rm shortly after untimely passing. Pt's daughter was outside of pt room; very tearful. She talked about their plans for Christmas; she explained that she was here to pick up her mother and take her home. She said hospice bedding, etc, was set up last night. Pt's daughter was making calls; other family members, including pt's son, arrived shortly after. Offered emotional and pastoral support to daughter/son and other family members as needed. Family members were appropriately tearful during our time together.  Chaplain Marlise Eves Holder   10-11-15 1500  Clinical Encounter Type  Visited With Family

## 2015-10-19 NOTE — Progress Notes (Signed)
Awake alert in no distress  on bedside commode Chart reviewed Discussed with bedside RN Patient to go home with hospice today Hospice has delivered medical equipment to the patient's house late yesterday evening No additional recommendations  Loistine Chance MD [facility] health palliative medicine team [phone]

## 2015-10-19 DEATH — deceased

## 2015-10-22 ENCOUNTER — Ambulatory Visit: Payer: Medicare Other | Admitting: Internal Medicine

## 2015-10-23 ENCOUNTER — Ambulatory Visit: Payer: Medicare Other | Admitting: Internal Medicine

## 2015-10-23 ENCOUNTER — Ambulatory Visit: Payer: Medicare Other

## 2015-10-23 ENCOUNTER — Encounter: Payer: Medicare Other | Admitting: Nutrition

## 2015-10-23 ENCOUNTER — Other Ambulatory Visit: Payer: Medicare Other

## 2015-10-30 ENCOUNTER — Ambulatory Visit: Payer: Medicare Other

## 2015-10-30 ENCOUNTER — Other Ambulatory Visit: Payer: Medicare Other

## 2016-01-02 ENCOUNTER — Other Ambulatory Visit: Payer: Self-pay | Admitting: Nurse Practitioner

## 2016-06-25 IMAGING — CR DG CHEST 1V PORT
1 series · 2 of 2 positions shown · non-contrast
Comparison: 09/02/2014

CLINICAL DATA: Shortness of breath for 3 days, productive cough,
history of lung carcinoma

EXAM:
PORTABLE CHEST - 1 VIEW

[Series 1: AP · U · 2 of 2 slices shown]
[im 1/2]
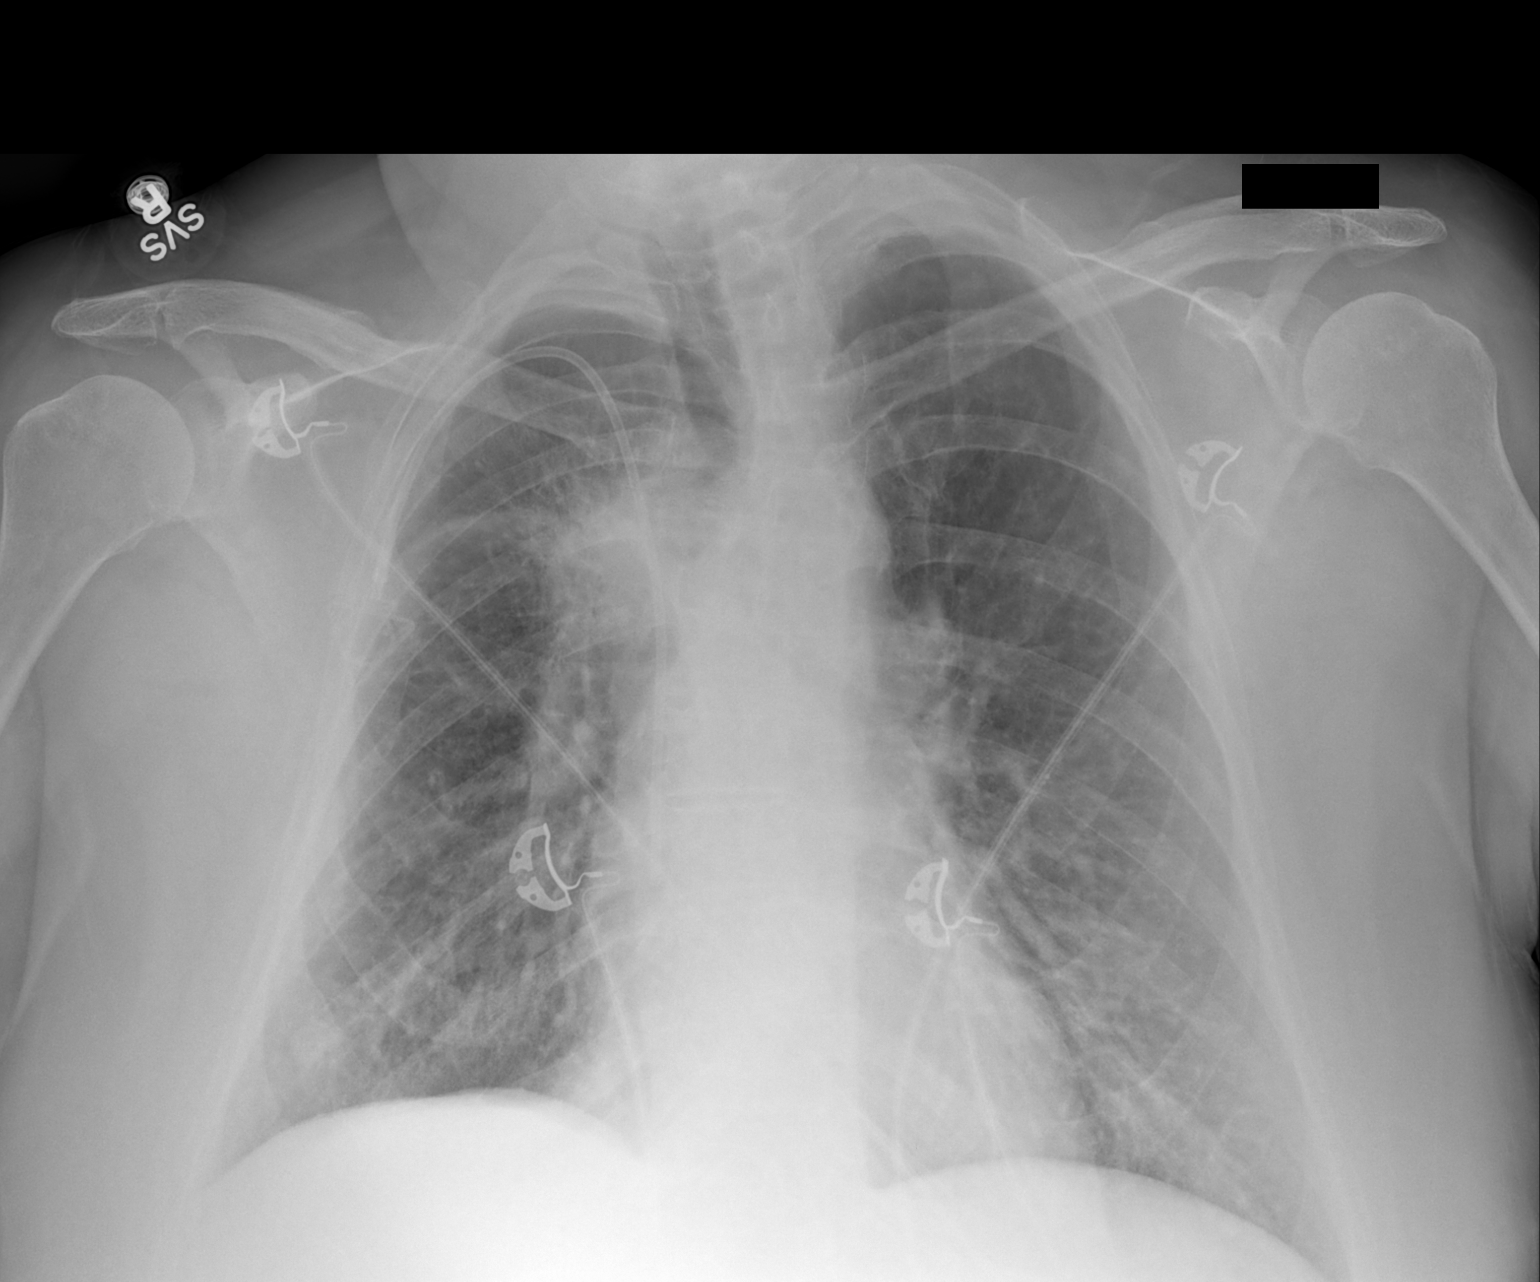
[im 2/2]
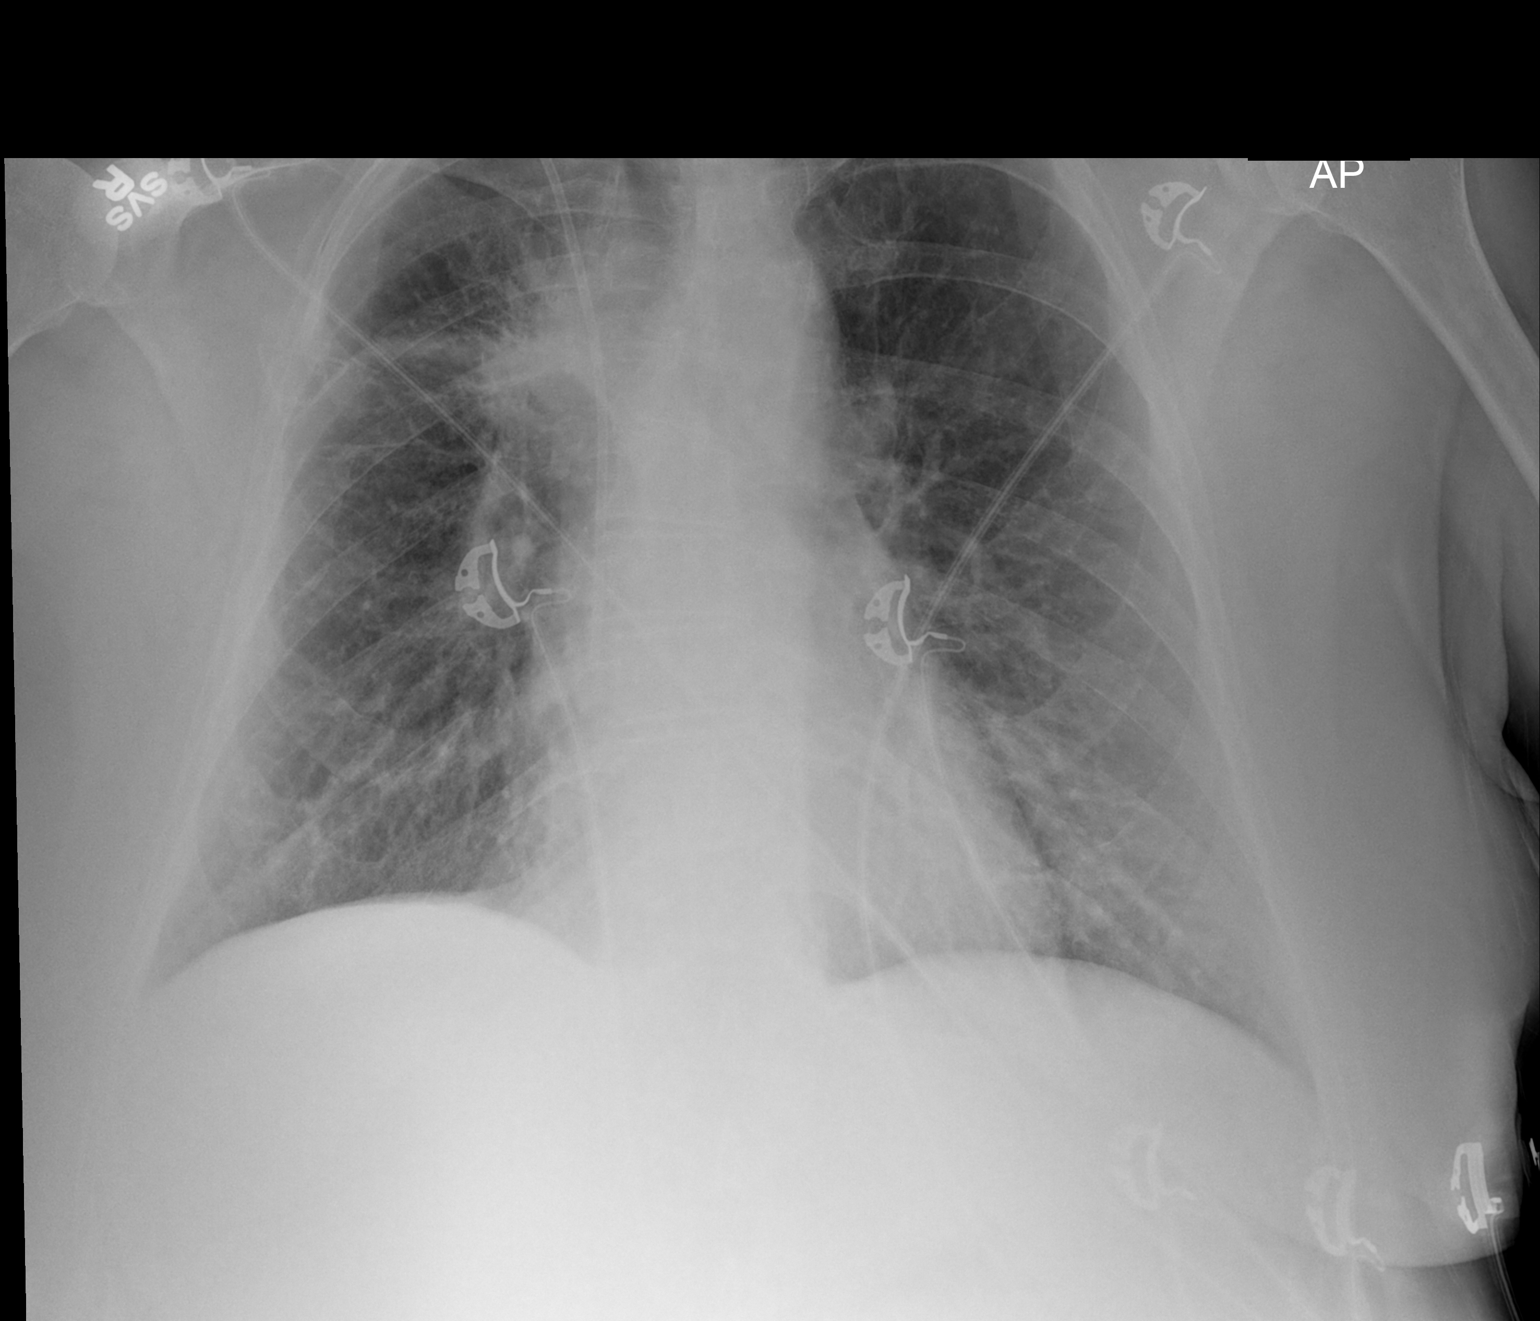

[2 of 2 positions shown; findings below may reference images not displayed]

FINDINGS: Cardiac shadow is within normal limits. A right chest wall port is
again seen with the catheter tip at the cavoatrial junction. Left
lung remains clear. There are its right suprahilar changes again
noted consistent with the patient's given clinical history. No new
effusion or focal infiltrate is seen.
IMPRESSION: Stable changes when compared with the prior CT examination
consistent with the patient's given clinical history No acute
abnormality is noted.

## 2016-06-25 IMAGING — CT CT ANGIO CHEST
1 of 2 series · 19 of 32 positions shown · IV contrast (OMNIPAQUE 350)
Comparison: Chest radiograph October 09, 2014 at 8773 hr and CT of
the chest, abdomen and pelvis September 02, 2014

CLINICAL DATA: Worsening shortness of breath for 3 days. History of
lung cancer and COPD, last chemotherapy treatment October 02, 2014

EXAM:
CT ANGIOGRAPHY CHEST WITH CONTRAST
TECHNIQUE: Multidetector CT imaging of the chest was performed using the
standard protocol during bolus administration of intravenous
contrast. Multiplanar CT image reconstructions and MIPs were
obtained to evaluate the vascular anatomy.
CONTRAST:  100mL OMNIPAQUE IOHEXOL 350 MG/ML SOLN

[Series 10: thins for pacs · axial · 0.70mm/px · z∈[+1404,+1640]mm · 19 of 264 slices shown]
[im 14/264  lung]
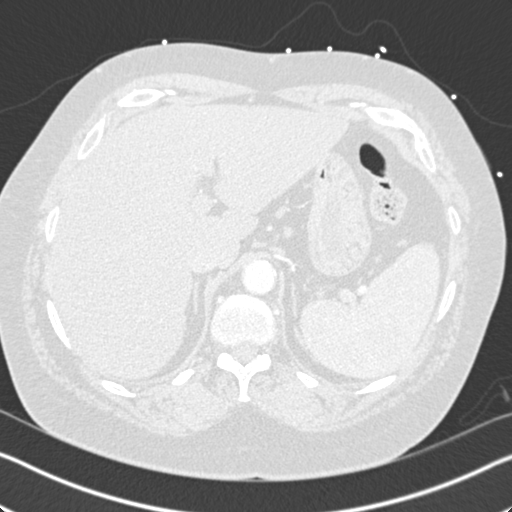
[im 27/264  mediastinal]
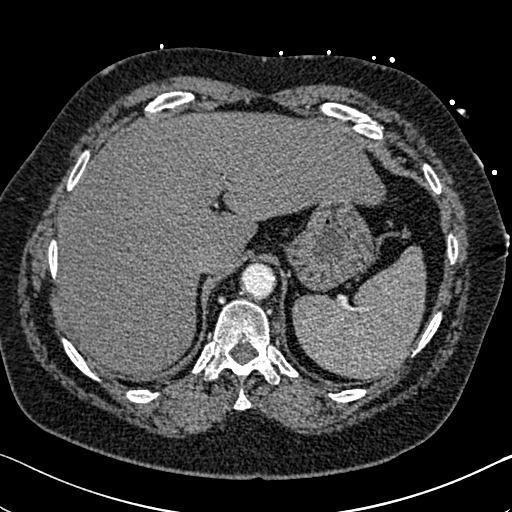
[im 40/264  lung]
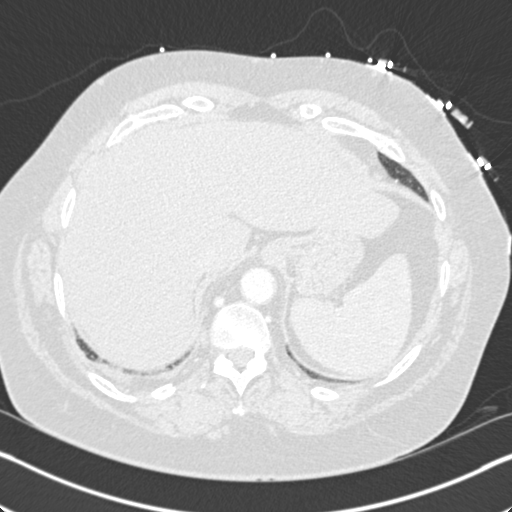
[im 66/264  mediastinal]
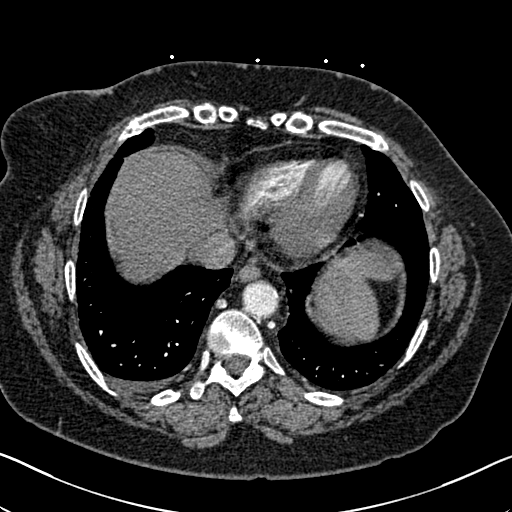
[im 79/264  lung]
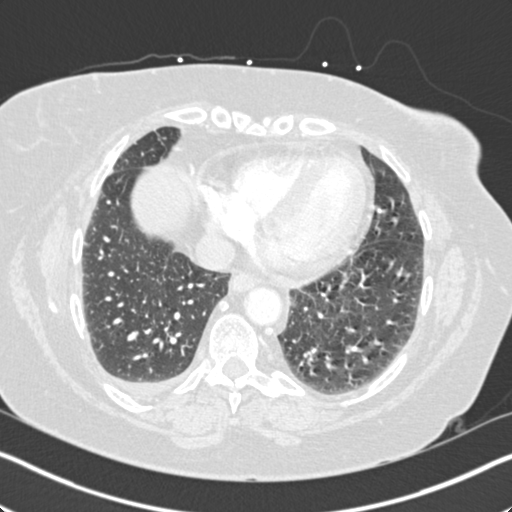
[im 88/264  mediastinal]
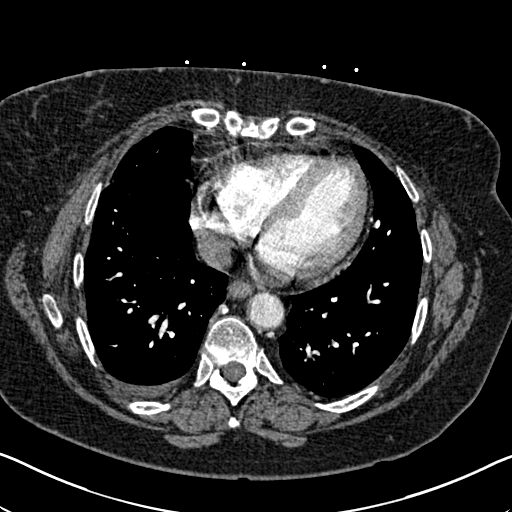
[im 93/264  lung]
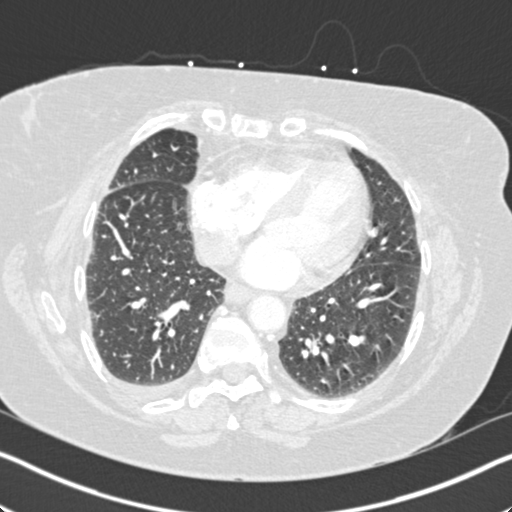
[im 106/264  mediastinal]
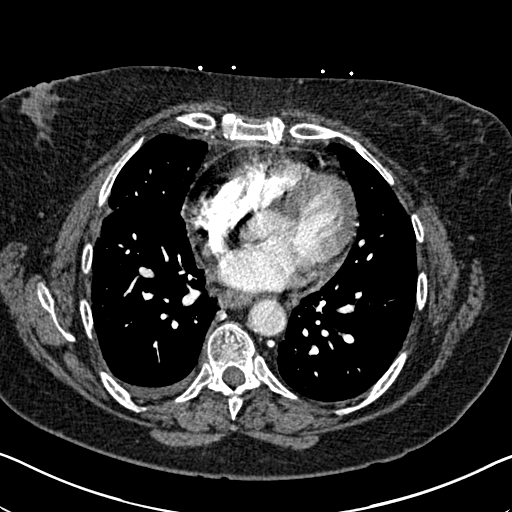
[im 119/264  lung]
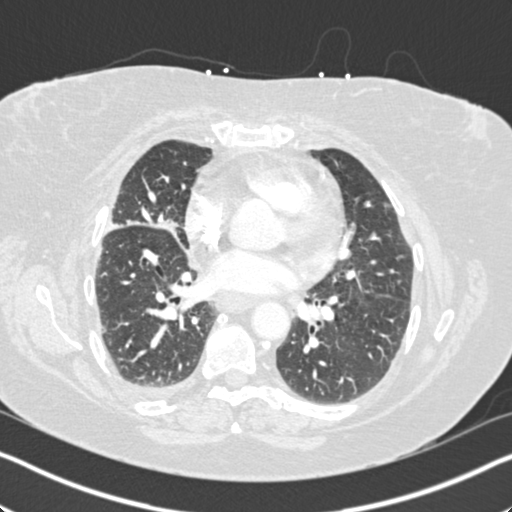
[im 132/264  mediastinal]
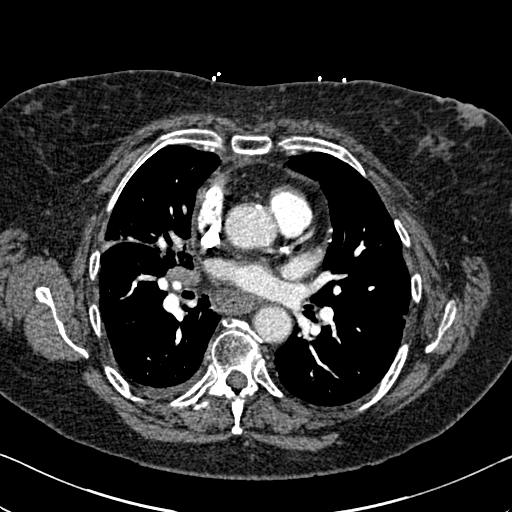
[im 145/264  lung]
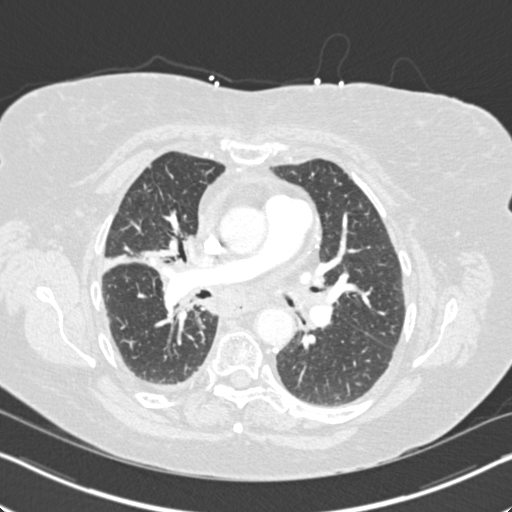
[im 158/264  mediastinal]
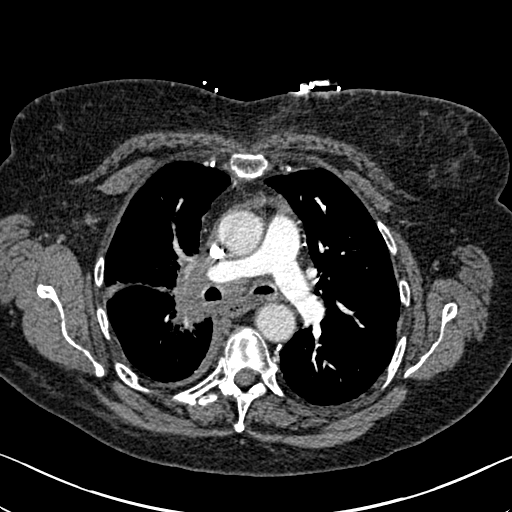
[im 171/264  lung]
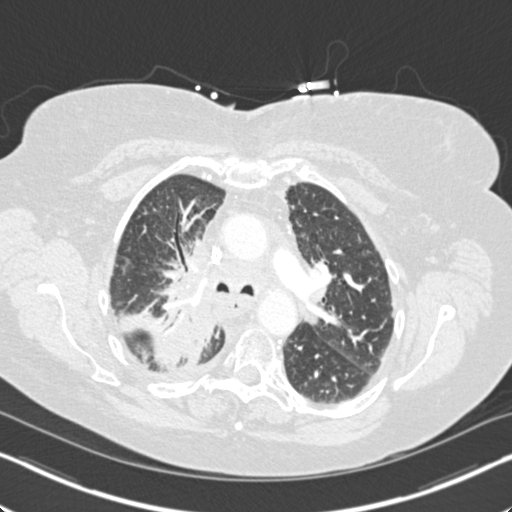
[im 176/264  mediastinal]
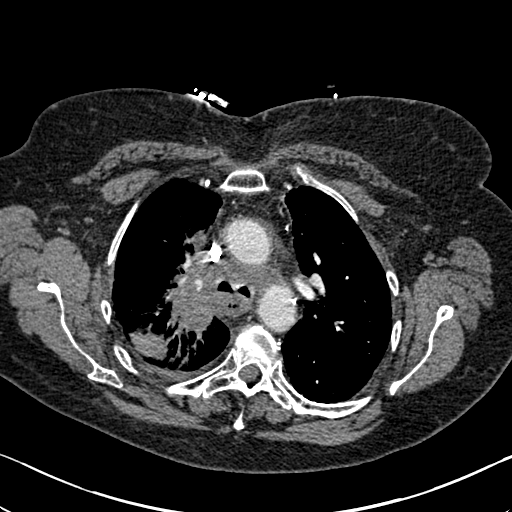
[im 185/264  lung]
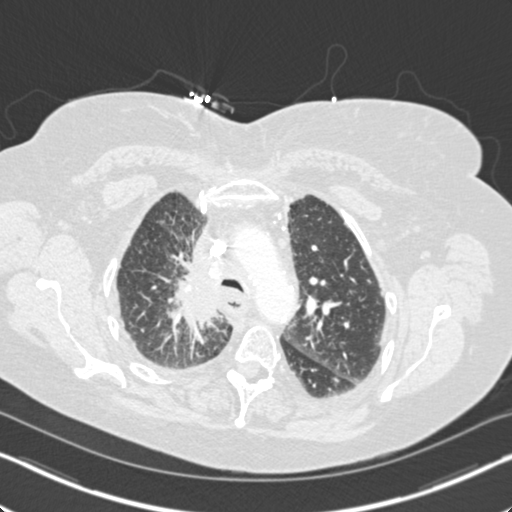
[im 198/264  mediastinal]
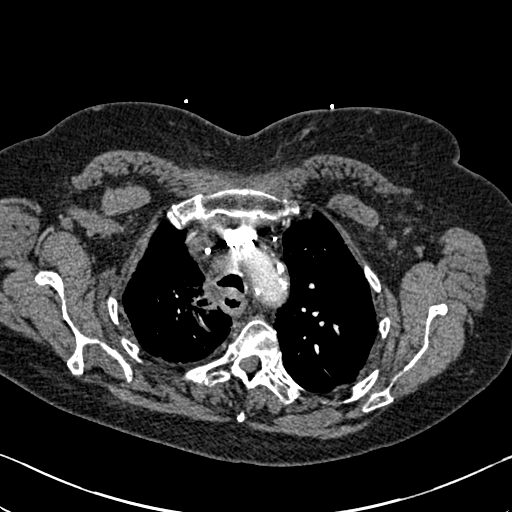
[im 224/264  lung]
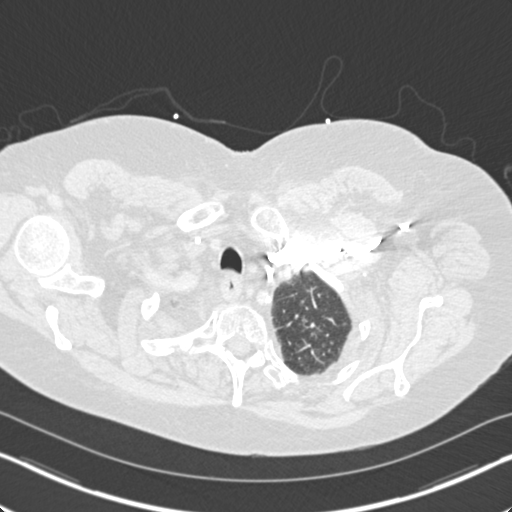
[im 237/264  mediastinal]
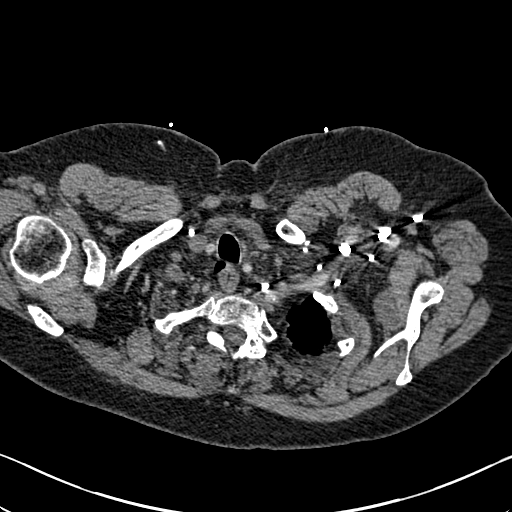
[im 250/264  lung]
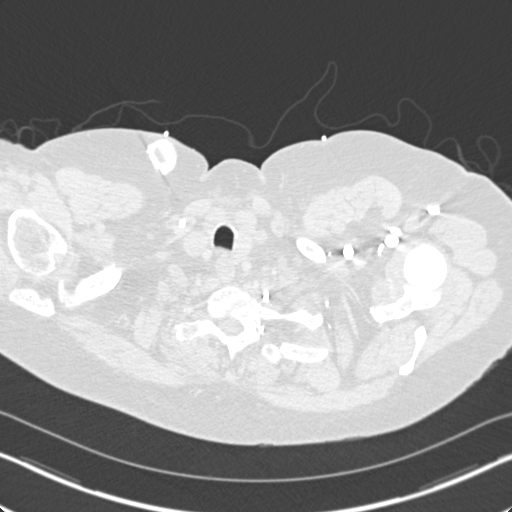

[19 of 32 positions shown; findings below may reference images not displayed]

FINDINGS: Adequate pulmonary arterial contrast opacification. Main pulmonary
artery is not enlarged. No acute pulmonary embolism to the level of
the subsegmental branches. Encasement of the RIGHT upper lobe
pulmonary artery due to RIGHT hilar/upper lobe mass, previously
measuring 2.6 by 4.7 cm, now approximately 6.7 x 3.6 cm, with
effaced RIGHT upper lobe bronchi, worse. Surrounding consolidation,
centrilobular nodular densities. No pleural effusions. No
pneumothorax.

Heart size is normal. Pericardial thickening without effusion.
Thoracic aorta is normal in course and caliber with moderate
calcific atherosclerosis. RIGHT hilar lymphadenopathy is confluent
from the mass, difficult to discretely identified.

Included view of the abdomen is nonsuspicious. Probable
thyroidectomy. Scoliosis without destructive bony lesions.

Review of the MIP images confirms the above findings.
IMPRESSION: No acute pulmonary embolism.

Increasing size of RIGHT upper lobe mass consistent with disease
progression, surrounding centrilobular nodular densities concerning
for lymphangitic spread of disease, possible radiation pneumonitis
in the proper clinical setting. RIGHT upper lobe bronchi effacement.

  By: Sexto Vardanyan

## 2024-07-06 NOTE — Progress Notes (Signed)
 This encounter was created in error - please disregard.
